# Patient Record
Sex: Female | Born: 1950 | Race: Black or African American | Hispanic: No | Marital: Married | Smoking: Never smoker
Health system: Southern US, Community
[De-identification: ages and names within clinical notes are randomized; demographics above are authoritative.]

## PROBLEM LIST (undated history)

## (undated) DIAGNOSIS — Z9889 Other specified postprocedural states: Secondary | ICD-10-CM

## (undated) DIAGNOSIS — K219 Gastro-esophageal reflux disease without esophagitis: Secondary | ICD-10-CM

## (undated) DIAGNOSIS — C801 Malignant (primary) neoplasm, unspecified: Secondary | ICD-10-CM

## (undated) DIAGNOSIS — C50919 Malignant neoplasm of unspecified site of unspecified female breast: Secondary | ICD-10-CM

## (undated) DIAGNOSIS — I509 Heart failure, unspecified: Secondary | ICD-10-CM

## (undated) DIAGNOSIS — I10 Essential (primary) hypertension: Secondary | ICD-10-CM

## (undated) DIAGNOSIS — E119 Type 2 diabetes mellitus without complications: Secondary | ICD-10-CM

## (undated) DIAGNOSIS — F32A Depression, unspecified: Secondary | ICD-10-CM

## (undated) DIAGNOSIS — E7800 Pure hypercholesterolemia, unspecified: Secondary | ICD-10-CM

## (undated) DIAGNOSIS — I639 Cerebral infarction, unspecified: Secondary | ICD-10-CM

## (undated) DIAGNOSIS — Z87442 Personal history of urinary calculi: Secondary | ICD-10-CM

## (undated) DIAGNOSIS — F419 Anxiety disorder, unspecified: Secondary | ICD-10-CM

## (undated) DIAGNOSIS — R112 Nausea with vomiting, unspecified: Secondary | ICD-10-CM

## (undated) HISTORY — PX: APPENDECTOMY: SHX54

## (undated) HISTORY — PX: MULTIPLE TOOTH EXTRACTIONS: SHX2053

## (undated) HISTORY — PX: BREAST SURGERY: SHX581

## (undated) HISTORY — DX: Malignant neoplasm of unspecified site of unspecified female breast: C50.919

## (undated) HISTORY — PX: CHOLECYSTECTOMY: SHX55

## (undated) HISTORY — PX: COLONOSCOPY: SHX174

## (undated) HISTORY — PX: EYE SURGERY: SHX253

## (undated) HISTORY — PX: STOMACH SURGERY: SHX791

## (undated) HISTORY — PX: MASTECTOMY: SHX3

## (undated) NOTE — *Deleted (*Deleted)
[facility] - Clinic Note  07/06/2020     CHIEF COMPLAINT Patient presents for No chief complaint on file.   HISTORY OF PRESENT ILLNESS: Cheryl Blankenship is a 71 y.o. female who presents to the clinic today for:   pt states no problems after last injection, she is here for IVA OS today  Referring physician: Alma Downs, PA-C  No referring provider defined for this encounter.  HISTORICAL INFORMATION:   Selected notes from the MEDICAL RECORD NUMBER Referred by Alma Downs, PA-C    CURRENT MEDICATIONS: No current outpatient medications on file. (Ophthalmic Drugs)   No current facility-administered medications for this visit. (Ophthalmic Drugs)   No current outpatient medications on file. (Other)   No current facility-administered medications for this visit. (Other)      REVIEW OF SYSTEMS:    ALLERGIES Allergies  Allergen Reactions  . Lisinopril     PAST MEDICAL HISTORY Past Medical History:  Diagnosis Date  . Anxiety   . CHF (congestive heart failure) (HCC)   . Diabetes mellitus without complication (HCC)   . Hypertension    No past surgical history on file.  FAMILY HISTORY Family History  Problem Relation Age of Onset  . Breast cancer Sister     SOCIAL HISTORY Social History   Tobacco Use  . Smoking status: Not on file  Substance Use Topics  . Alcohol use: Not on file  . Drug use: Not on file         OPHTHALMIC EXAM:  Not recorded     IMAGING AND PROCEDURES  Imaging and Procedures for 07/06/2020           ASSESSMENT/PLAN:    ICD-10-CM   1. Proliferative diabetic retinopathy of both eyes with macular edema associated with type 2 diabetes mellitus (HCC)  Z61.0960   2. Retinal edema  H35.81   3. Essential hypertension  I10   4. Hypertensive retinopathy of both eyes  H35.033   5. Pseudophakia, both eyes  Z96.1     1,2. Proliferative diabetic retinopathy w/ DME, OU (OS > OD)  - HbA1c 11.5% on  7.15.21 - exam shows scattered IRH/DBH OU; - FA (09.27.21) shows late-leaking MA, vascular nonperfusion, +NV OU -- will need PRP OU (OS first) - OCT with noncentral diabetic macular edema, both eyes  (OD> OS)   - OD: Focal edema temporal macula   - OS: Scattered cystic changes greatest superiorly - s/p IVA OD #1 (09.29.21) - s/p IVA OS #1 (10.08.2021) - recommend IVA OS #1 today, 10.08.21  - RBA of procedure discussed, questions answered - IVA informed consent obtained and signed 9.29.21 (OD) - IVA informed consent obtained and signed 10.08.21 (OS) - see procedure note - f/u October 18, 10:00am, DFE, OCT -- PRP OS  3,4. Hypertensive retinopathy OU - discussed importance of tight BP control - monitor  5. Pseudophakia OU  - s/p CE/IOL (~3-4 years ago, [place])  - IOL in good position, doing well  - monitor   Ophthalmic Meds Ordered this visit:  No orders of the defined types were placed in this encounter.      No follow-ups on file.  There are no Patient Instructions on file for this visit.  This document serves as a record of services personally performed by Karie Chimera, MD, PhD. It was created on their behalf by Herby Abraham, COA, an ophthalmic technician. The creation of this record is the provider's dictation and/or activities during the visit.  Electronically signed by: Herby Abraham, COA @TODAY @ 10:50 AM   Abbreviations: M myopia (nearsighted); A astigmatism; H hyperopia (farsighted); P presbyopia; Mrx spectacle prescription;  CTL contact lenses; OD right eye; OS left eye; OU both eyes  XT exotropia; ET esotropia; PEK punctate epithelial keratitis; PEE punctate epithelial erosions; DES dry eye syndrome; MGD meibomian gland dysfunction; ATs artificial tears; PFAT's preservative free artificial tears; NSC nuclear sclerotic cataract; PSC posterior subcapsular cataract; ERM epi-retinal membrane; PVD posterior vitreous detachment; RD retinal detachment; DM diabetes  mellitus; DR diabetic retinopathy; NPDR non-proliferative diabetic retinopathy; PDR proliferative diabetic retinopathy; CSME clinically significant macular edema; DME diabetic macular edema; dbh dot blot hemorrhages; CWS cotton wool spot; POAG primary open angle glaucoma; C/D cup-to-disc ratio; HVF humphrey visual field; GVF goldmann visual field; OCT optical coherence tomography; IOP intraocular pressure; BRVO Branch retinal vein occlusion; CRVO central retinal vein occlusion; CRAO central retinal artery occlusion; BRAO branch retinal artery occlusion; RT retinal tear; SB scleral buckle; PPV pars plana vitrectomy; VH Vitreous hemorrhage; PRP panretinal laser photocoagulation; IVK intravitreal kenalog; VMT vitreomacular traction; MH Macular hole;  NVD neovascularization of the disc; NVE neovascularization elsewhere; AREDS age related eye disease study; ARMD age related macular degeneration; POAG primary open angle glaucoma; EBMD epithelial/anterior basement membrane dystrophy; ACIOL anterior chamber intraocular lens; IOL intraocular lens; PCIOL posterior chamber intraocular lens; Phaco/IOL phacoemulsification with intraocular lens placement; PRK photorefractive keratectomy; LASIK laser assisted in situ keratomileusis; HTN hypertension; DM diabetes mellitus; COPD chronic obstructive pulmonary disease

---

## 2020-03-10 ENCOUNTER — Other Ambulatory Visit: Payer: Self-pay

## 2020-03-10 ENCOUNTER — Emergency Department (HOSPITAL_COMMUNITY)
Admission: EM | Admit: 2020-03-10 | Discharge: 2020-03-11 | Disposition: A | Discharge status: Home / Self Care | Payer: Medicare HMO | Attending: Emergency Medicine | Admitting: Emergency Medicine

## 2020-03-10 ENCOUNTER — Encounter (HOSPITAL_COMMUNITY): Payer: Self-pay

## 2020-03-10 DIAGNOSIS — Z5321 Procedure and treatment not carried out due to patient leaving prior to being seen by health care provider: Secondary | ICD-10-CM | POA: Diagnosis not present

## 2020-03-10 DIAGNOSIS — R739 Hyperglycemia, unspecified: Secondary | ICD-10-CM | POA: Diagnosis present

## 2020-03-10 DIAGNOSIS — Z760 Encounter for issue of repeat prescription: Secondary | ICD-10-CM | POA: Diagnosis not present

## 2020-03-10 HISTORY — DX: Type 2 diabetes mellitus without complications: E11.9

## 2020-03-10 HISTORY — DX: Anxiety disorder, unspecified: F41.9

## 2020-03-10 HISTORY — DX: Heart failure, unspecified: I50.9

## 2020-03-10 HISTORY — DX: Essential (primary) hypertension: I10

## 2020-03-10 NOTE — ED Triage Notes (Signed)
Patient arrived stating she recently moved and has had trouble getting all of her medications refilled. States she recently lost her son and has been feeling depressed.

## 2020-03-11 LAB — CBG MONITORING, ED: Glucose-Capillary: 347 mg/dL — ABNORMAL HIGH (ref 70–99)

## 2020-05-01 ENCOUNTER — Other Ambulatory Visit: Payer: Self-pay | Admitting: Student

## 2020-05-01 DIAGNOSIS — E2839 Other primary ovarian failure: Secondary | ICD-10-CM

## 2020-05-07 ENCOUNTER — Other Ambulatory Visit: Payer: Self-pay | Admitting: Student

## 2020-05-07 DIAGNOSIS — Z1231 Encounter for screening mammogram for malignant neoplasm of breast: Secondary | ICD-10-CM

## 2020-05-20 ENCOUNTER — Ambulatory Visit
Admission: RE | Admit: 2020-05-20 | Discharge: 2020-05-20 | Disposition: A | Discharge status: Home / Self Care | Payer: Medicare HMO | Source: Ambulatory Visit | Attending: Student | Admitting: Student

## 2020-05-20 ENCOUNTER — Other Ambulatory Visit: Payer: Self-pay

## 2020-05-20 DIAGNOSIS — Z1231 Encounter for screening mammogram for malignant neoplasm of breast: Secondary | ICD-10-CM

## 2020-05-20 IMAGING — MG DIGITAL SCREENING BILAT W/ TOMO W/ CAD
6 of 10 series · 6 of 30 positions shown · non-contrast
Comparison: None.

CLINICAL DATA: Screening.

EXAM:
DIGITAL SCREENING BILATERAL MAMMOGRAM WITH TOMO AND CAD

[R MLO synth-2D (1 of 2)]
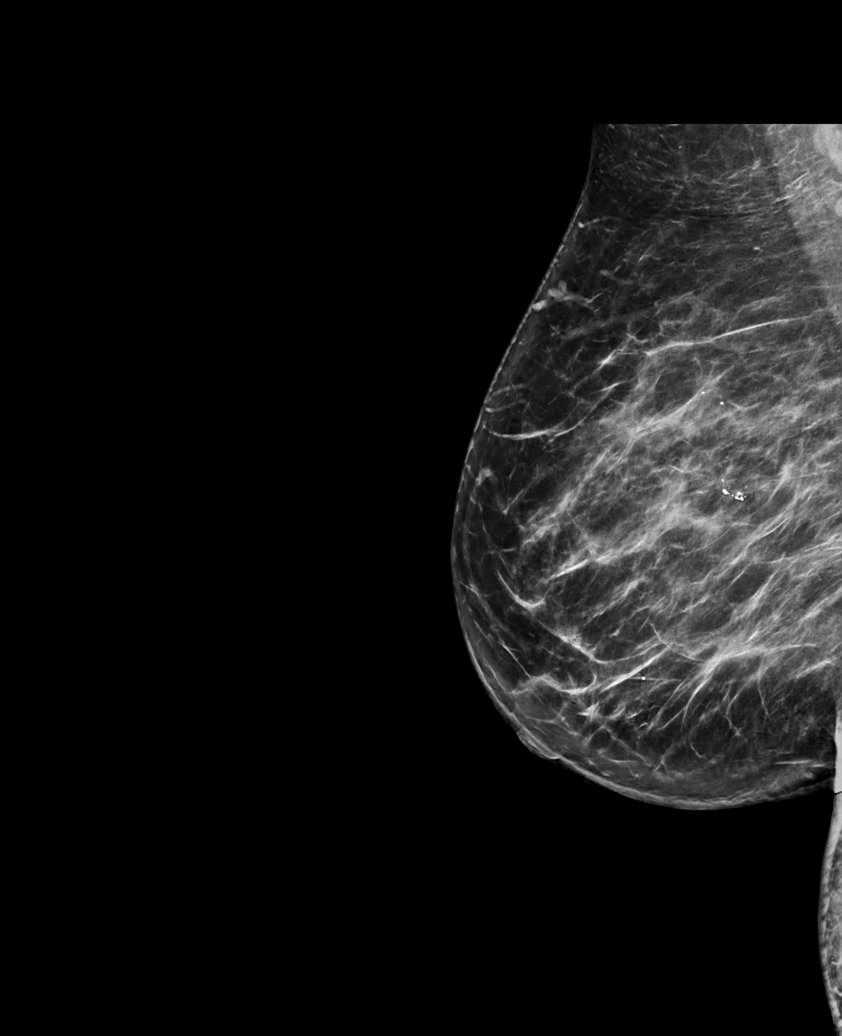

[R CC synth-2D]
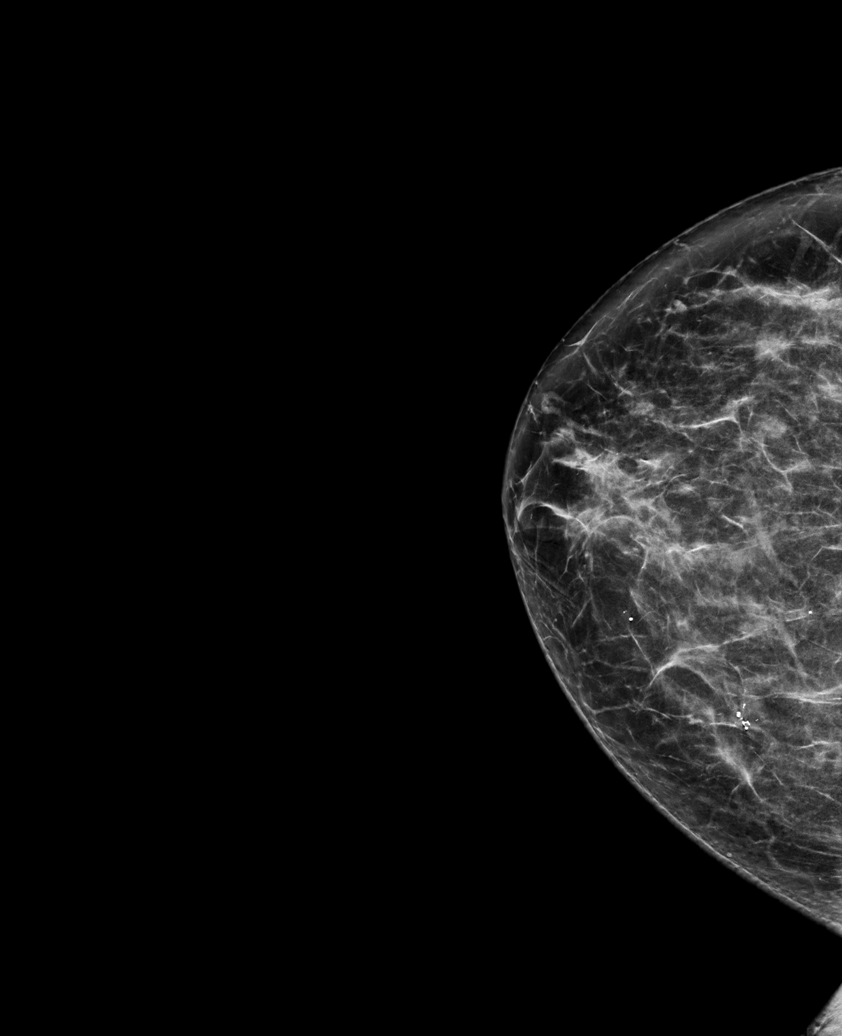

[R MLO synth-2D (2 of 2)]
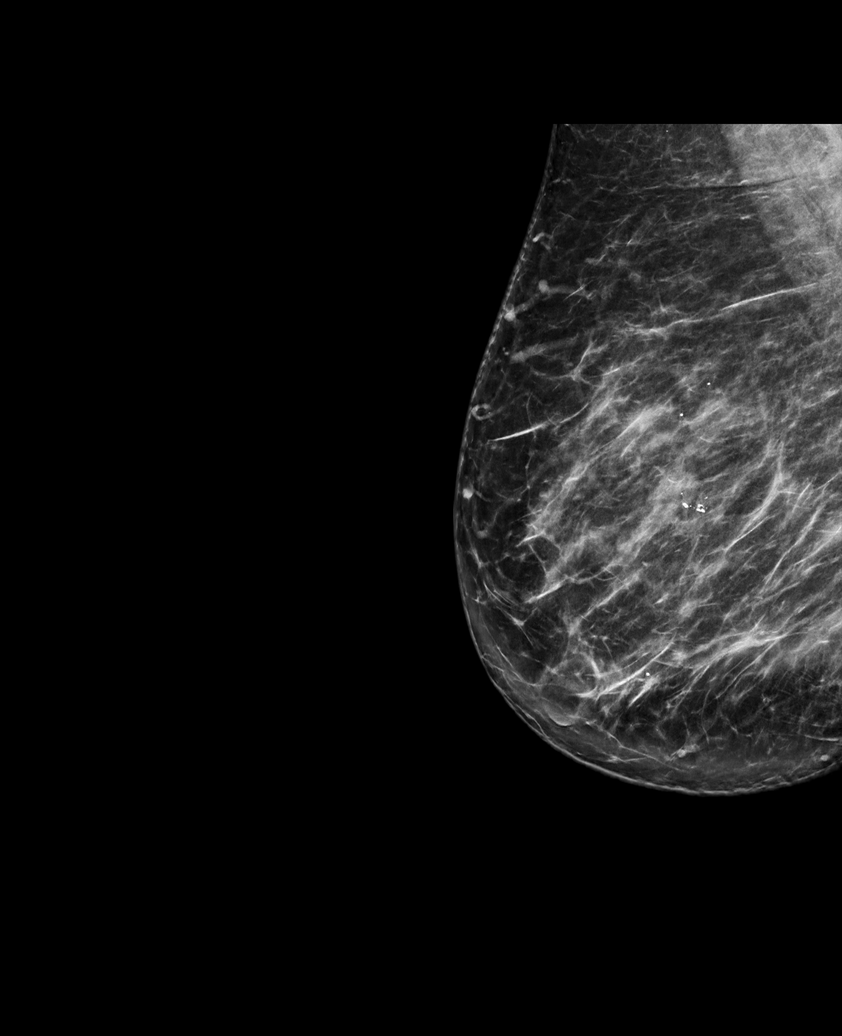

[L MLO synth-2D]
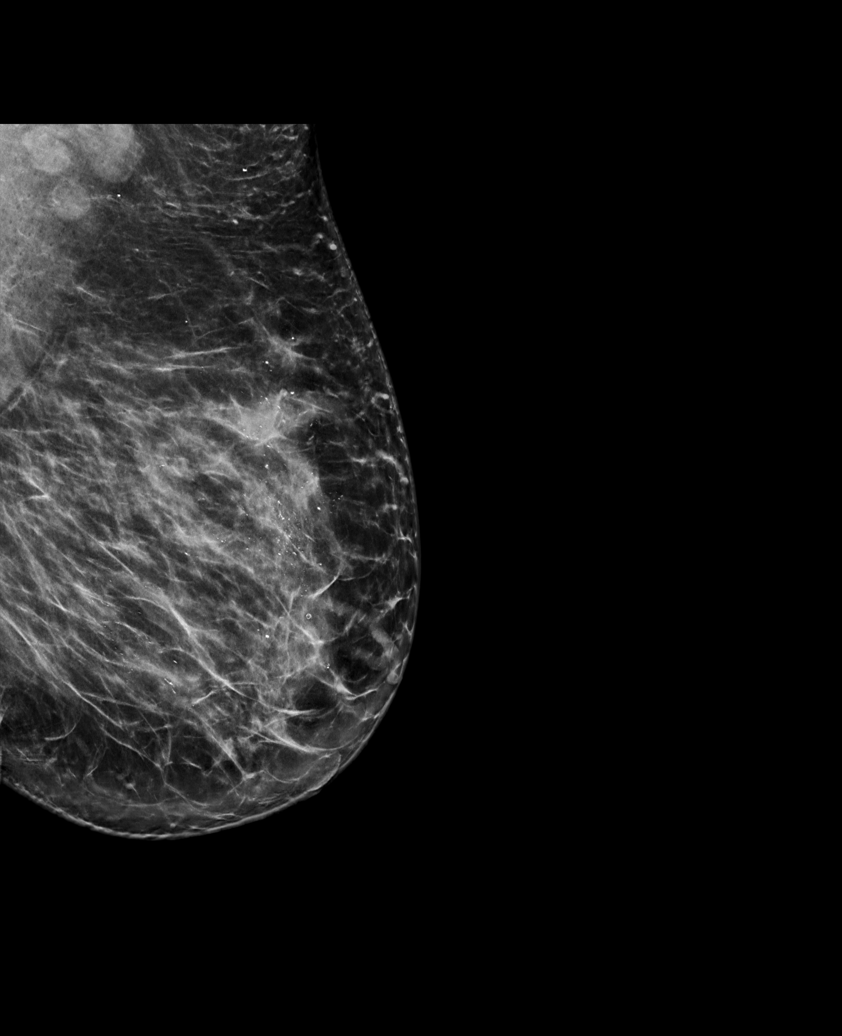

[L CC synth-2D]
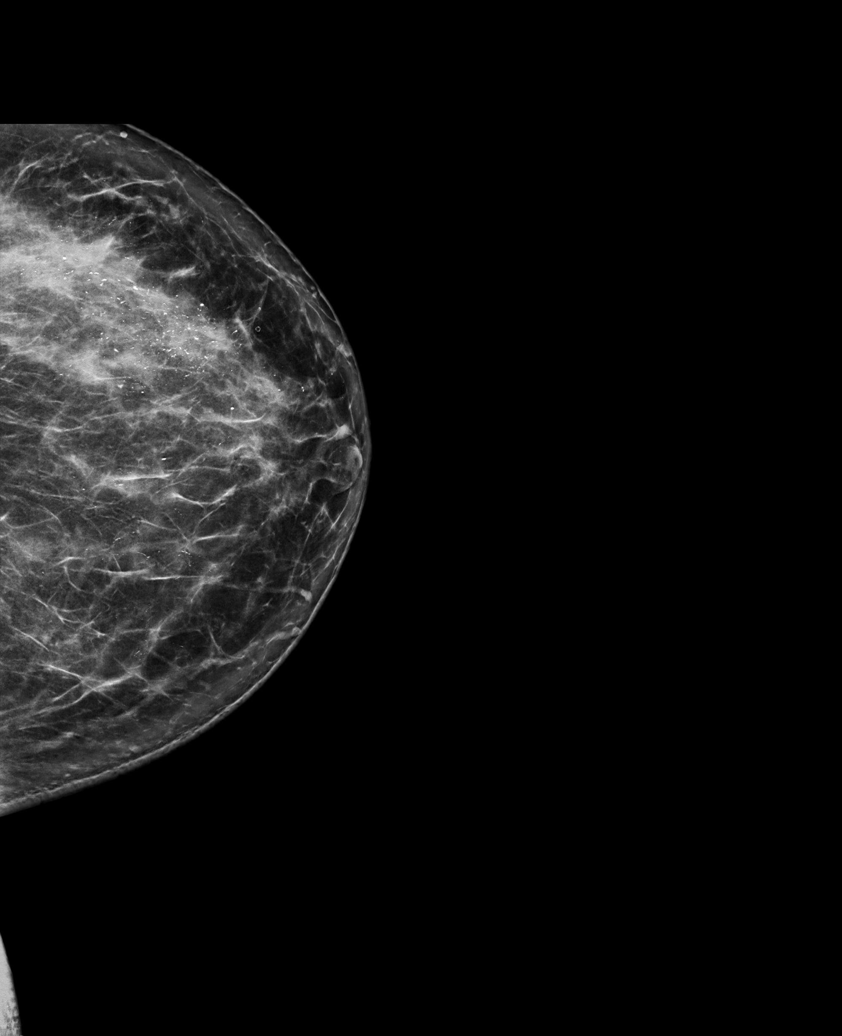

[L MLO tomo · tomo slice 49/96.0]
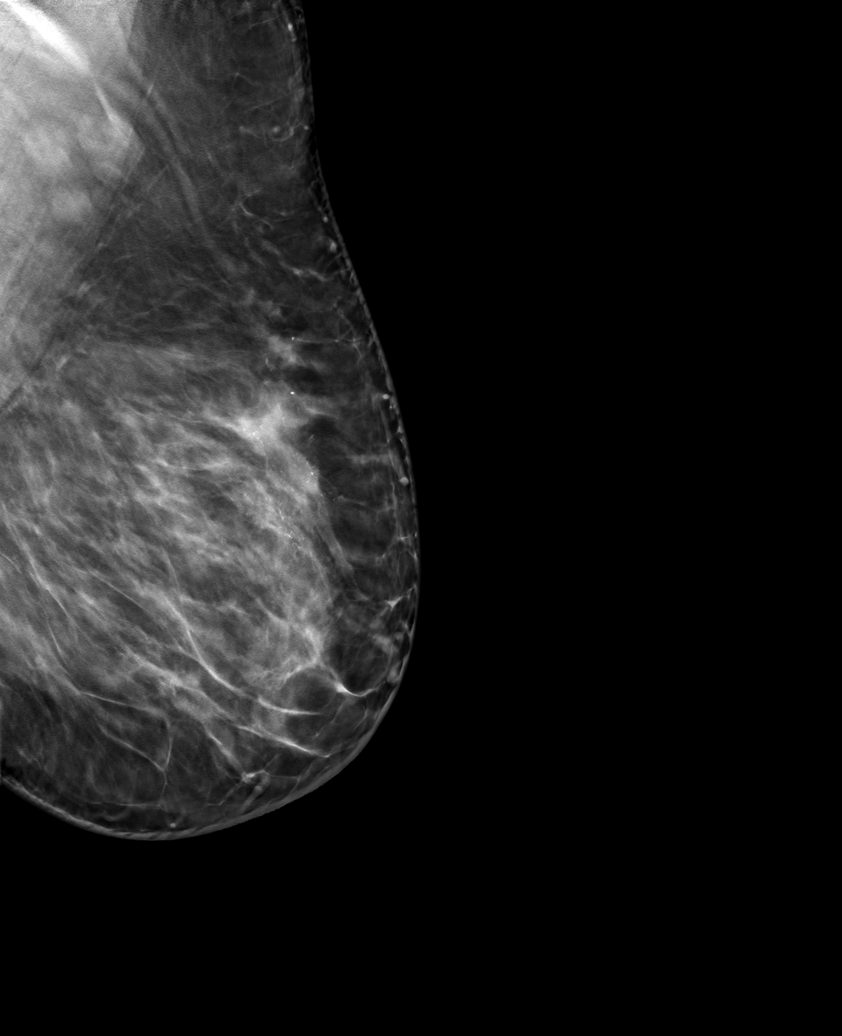

[6 of 30 positions shown; findings below may reference images not displayed]

ACR Breast Density Category c: The breast tissue is heterogeneously
dense, which may obscure small masses.
FINDINGS: In the left breast, calcifications warrant further evaluation. There
appear to be prominent lymph nodes are seen in the left axilla. In
the right breast, no findings suspicious for malignancy. Images were
processed with CAD.
IMPRESSION: Further evaluation is suggested for calcifications in the left
breast and lymph nodes in the left axilla.

RECOMMENDATION:
Diagnostic mammogram of the left breast. (Code:[WM])

The patient will be contacted regarding the findings, and additional
imaging will be scheduled.

BI-RADS CATEGORY  0: Incomplete. Need additional imaging evaluation
and/or prior mammograms for comparison.

## 2020-05-26 ENCOUNTER — Other Ambulatory Visit: Payer: Self-pay | Admitting: Student

## 2020-05-26 DIAGNOSIS — R928 Other abnormal and inconclusive findings on diagnostic imaging of breast: Secondary | ICD-10-CM

## 2020-05-27 ENCOUNTER — Encounter (INDEPENDENT_AMBULATORY_CARE_PROVIDER_SITE_OTHER): Payer: Self-pay

## 2020-05-27 ENCOUNTER — Encounter (INDEPENDENT_AMBULATORY_CARE_PROVIDER_SITE_OTHER): Payer: Medicare HMO | Admitting: Ophthalmology

## 2020-06-11 NOTE — Progress Notes (Addendum)
[facility] Note  06/15/2020     CHIEF COMPLAINT Patient presents for Retina Evaluation   HISTORY OF PRESENT ILLNESS: Cheryl Blankenship is a 69 y.o. female who presents to the clinic today for:   HPI    Retina Evaluation    In both eyes.  This started weeks ago.  Duration of weeks.  Context:  distance vision and near vision.  I, the attending physician,  performed the HPI with the patient and updated documentation appropriately.          Comments    Last BS: 260 last night Last HgA1c: 10.? Hx of CEIOL OU ([place], 3-4 years ago) Pt states her vision has become blurry in both eyes and is worse in her right eye.  Patient complains of double vision OU that is constant.  Patient denies eye pain or discomfort and denies any new or worsening floaters or fol OU.       Last edited by Bernarda Caffey, MD on 06/15/2020  8:11 AM. (History)    pt is here on the referral of Shirleen Schirmer, PA-C, she states she went to see him for a routine exam bc she was having double vision, pt states they would not give her a glasses rx until she was seen here, pt states she has never seen a retina specialist before and has never been told she has diabetic retinopathy, pt has had cataract sx, but no other eye sx  Referring physician: Shirleen Schirmer, PA-C  [address] [place],  [place] [address]  HISTORICAL INFORMATION:   Selected notes from the MEDICAL RECORD NUMBER Referred by Shirleen Schirmer, PA-C LEE:  Ocular Hx- PMH-    CURRENT MEDICATIONS: No current outpatient medications on file. (Ophthalmic Drugs)   No current facility-administered medications for this visit. (Ophthalmic Drugs)   No current outpatient medications on file. (Other)   No current facility-administered medications for this visit. (Other)      REVIEW OF SYSTEMS: ROS    Positive for: Eyes   Negative for: Constitutional, Gastrointestinal, Neurological,  Skin, Genitourinary, Musculoskeletal, HENT, Endocrine, Cardiovascular, Respiratory, Psychiatric, Allergic/Imm, Heme/Lymph   Last edited by Doneen Poisson on 06/15/2020  7:44 AM. (History)       ALLERGIES Allergies  Allergen Reactions  . Lisinopril     PAST MEDICAL HISTORY Past Medical History:  Diagnosis Date  . Anxiety   . CHF (congestive heart failure) ([place])   . Diabetes mellitus without complication ([place])   . Hypertension    History reviewed. No pertinent surgical history.  FAMILY HISTORY Family History  Problem Relation Age of Onset  . Breast cancer Sister     SOCIAL HISTORY Social History   Tobacco Use  . Smoking status: Not on file  Substance Use Topics  . Alcohol use: Not on file  . Drug use: Not on file         OPHTHALMIC EXAM:  Base Eye Exam    Visual Acuity (Snellen - Linear)      Right Left   Dist [place] 20/70 +1 20/50 -2   Dist ph [place] NI NI       Tonometry (Tonopen, 7:55 AM)      Right Left   Pressure 23 21       Pupils      Dark Light Shape React APD   Right 3 2 Round Minimal 0   Left 3 2 Round Minimal 0  Visual Fields   Poor understanding and fixation.  Unreliable.       Extraocular Movement      Right Left    Full Full       Neuro/Psych    Oriented x3: Yes   Mood/Affect: Normal       Dilation    Both eyes: 1.0% Mydriacyl, 2.5% Phenylephrine @ 7:55 AM        Slit Lamp and Fundus Exam    Slit Lamp Exam      Right Left   Lids/Lashes Dermatochalasis - upper lid Dermatochalasis - upper lid   Conjunctiva/Sclera Trace Melanosis Trace Melanosis, nasal and temporal pinguecula   Cornea arcus, 2+ Punctate epithelial erosions, well healed temporal cataract wounds arcus, 2+ Punctate epithelial erosions, well healed temporal cataract wounds   Anterior Chamber Deep and quiet Deep and quiet   Iris Round and dilated, No NVI Round and dilated, No NVI   Lens PC IOL in good position PC IOL in good position   Vitreous Vitreous  syneresis Vitreous syneresis       Fundus Exam      Right Left   Disc Pink and Sharp Pink and Sharp, mild tilt, temporal PPA   C/D Ratio 0.3 0.4   Macula Flat, Blunted foveal reflex, IRH / DBH greatest temporally, mild ERM, focal edmea Flat, Blunted foveal reflex, ERM, scattered Microaneurysms / IRH greatest temporally, scattered Cystic changes   Vessels attenuated, Tortuous, early flat NV nasally attenuated, Tortuous, early flat NV nasal to disc   Periphery Attached, 360 DBH    Attached, scatterd IRH/DBH greatest nasally        Refraction    Manifest Refraction      Sphere Cylinder Axis Dist VA   Right +0.50 +1.00 015 20/70+1   Left NI +/-3.00             IMAGING AND PROCEDURES  Imaging and Procedures for 06/15/2020  OCT, Retina - OU - Both Eyes       Right Eye Quality was good. Central Foveal Thickness: 229. Progression has no prior data. Findings include normal foveal contour, intraretinal fluid, intraretinal hyper-reflective material, no SRF (Focal edema temporal macula).   Left Eye Quality was good. Central Foveal Thickness: 201. Progression has no prior data. Findings include normal foveal contour, intraretinal fluid, no SRF (Scattered cystic changes greatest superiorly).   Notes *Images captured and stored on drive  Diagnosis / Impression:  +DME OU (noncentral, OD>OS)  Clinical management:  See below  Abbreviations: NFP - Normal foveal profile. CME - cystoid macular edema. PED - pigment epithelial detachment. IRF - intraretinal fluid. SRF - subretinal fluid. EZ - ellipsoid zone. ERM - epiretinal membrane. ORA - outer retinal atrophy. ORT - outer retinal tubulation. SRHM - subretinal hyper-reflective material. IRHM - intraretinal hyper-reflective material        Fluorescein Angiography Optos (Transit OD)       Right Eye   Progression has no prior data. Early phase findings include microaneurysm, vascular perfusion defect. Mid/Late phase findings include  retinal neovascularization, leakage, microaneurysm, vascular perfusion defect (Large patches of vascular non-perfusion nasal hemisphere with mild leaking NV).   Left Eye   Progression has no prior data. Early phase findings include microaneurysm, vascular perfusion defect, retinal neovascularization, leakage. Mid/Late phase findings include vascular perfusion defect, microaneurysm, retinal neovascularization, leakage.   Notes **Images stored on drive**  Impression: PDR OU +NVE OU (OS > OD) Vascular perfusion defects OU Leaking MA OU  ASSESSMENT/PLAN:    ICD-10-CM   1. Proliferative diabetic retinopathy of both eyes with macular edema associated with type 2 diabetes mellitus ([place])  V29.1916   2. Retinal edema  H35.81 OCT, Retina - OU - Both Eyes  3. Essential hypertension  I10   4. Hypertensive retinopathy of both eyes  H35.033 Fluorescein Angiography Optos (Transit OD)  5. Pseudophakia, both eyes  Z96.1     1,2. Proliferative diabetic retinopathy w/ DME, OU (OS > OD)  - HbA1c 11.5% on 7.15.21 - The incidence, risk factors for progression, natural history and treatment options for diabetic retinopathy were discussed with patient.   - The need for close monitoring of blood glucose, blood pressure, and serum lipids, avoiding cigarette or any type of tobacco, and the need for long term follow up was also discussed with patient. - exam shows scattered IRH/DBH OU; - FA today (09.27.21) shows late-leaking MA, vascular nonperfusion, +NV OU -- will need PRP OU (OS first) - OCT with noncentral diabetic macular edema, both eyes  (OD> OS) - discussed findings and prognosis - recommend IVA OU, OS first today, 09.27.21 - pt unable to undergo injection today, wishes to come back on Wednesday for injections - f/u 2 days, DFE, OCT, likely IVA OU  3,4. Hypertensive retinopathy OU - discussed importance of tight BP control - monitor  5. Pseudophakia OU  - s/p CE/IOL  (~3-4 years ago, [place])  - IOL in good position, doing well  - monitor   Ophthalmic Meds Ordered this visit:  No orders of the defined types were placed in this encounter.      Return in about 2 days (around 06/17/2020) for f/u [place], Sept 29, 8:00AM, PDR OU, DFE, OCT.  There are no Patient Instructions on file for this visit.   Explained the diagnoses, plan, and follow up with the patient and they expressed understanding.  Patient expressed understanding of the importance of proper follow up care.   This document serves as a record of services personally performed by Gardiner Sleeper, MD, PhD. It was created on their behalf by Leeann Must, [place], an ophthalmic technician. The creation of this record is the provider's dictation and/or activities during the visit.    Electronically signed by: Leeann Must, [place] 09.23.2021 11:54 AM   This document serves as a record of services personally performed by Gardiner Sleeper, MD, PhD. It was created on their behalf by San Jetty. [place] Shark, OA an ophthalmic technician. The creation of this record is the provider's dictation and/or activities during the visit.    Electronically signed by: San Jetty. Owens Shark, [place] 09.27.2021 11:54 AM  Gardiner Sleeper, M.D., Ph.D. Diseases & Surgery of the Retina and Vitreous [facility]  I have reviewed the above documentation for accuracy and completeness, and I agree with the above. Gardiner Sleeper, M.D., Ph.D. 06/15/20 11:54 AM   Abbreviations: M myopia (nearsighted); A astigmatism; H hyperopia (farsighted); P presbyopia; Mrx spectacle prescription;  CTL contact lenses; OD right eye; OS left eye; OU both eyes  XT exotropia; ET esotropia; PEK punctate epithelial keratitis; PEE punctate epithelial erosions; DES dry eye syndrome; MGD meibomian gland dysfunction; ATs artificial tears; PFAT's preservative free artificial tears; [place] nuclear sclerotic cataract; PSC posterior subcapsular cataract; ERM  epi-retinal membrane; PVD posterior vitreous detachment; RD retinal detachment; DM diabetes mellitus; DR diabetic retinopathy; NPDR non-proliferative diabetic retinopathy; PDR proliferative diabetic retinopathy; CSME clinically significant macular edema; DME diabetic macular edema; dbh dot blot hemorrhages; CWS  cotton wool spot; POAG primary open angle glaucoma; C/D cup-to-disc ratio; HVF humphrey visual field; GVF goldmann visual field; OCT optical coherence tomography; IOP intraocular pressure; BRVO Branch retinal vein occlusion; CRVO central retinal vein occlusion; CRAO central retinal artery occlusion; BRAO branch retinal artery occlusion; RT retinal tear; SB scleral buckle; PPV pars plana vitrectomy; VH Vitreous hemorrhage; PRP panretinal laser photocoagulation; IVK intravitreal kenalog; VMT vitreomacular traction; MH Macular hole;  NVD neovascularization of the disc; NVE neovascularization elsewhere; AREDS age related eye disease study; ARMD age related macular degeneration; POAG primary open angle glaucoma; EBMD epithelial/anterior basement membrane dystrophy; ACIOL anterior chamber intraocular lens; IOL intraocular lens; PCIOL posterior chamber intraocular lens; Phaco/IOL phacoemulsification with intraocular lens placement; [place] photorefractive keratectomy; LASIK laser assisted in situ keratomileusis; HTN hypertension; DM diabetes mellitus; COPD chronic obstructive pulmonary disease

## 2020-06-15 ENCOUNTER — Ambulatory Visit (INDEPENDENT_AMBULATORY_CARE_PROVIDER_SITE_OTHER): Payer: Medicare HMO | Admitting: Ophthalmology

## 2020-06-15 ENCOUNTER — Encounter (INDEPENDENT_AMBULATORY_CARE_PROVIDER_SITE_OTHER): Payer: Self-pay | Admitting: Ophthalmology

## 2020-06-15 ENCOUNTER — Other Ambulatory Visit: Payer: Self-pay

## 2020-06-15 DIAGNOSIS — I10 Essential (primary) hypertension: Secondary | ICD-10-CM

## 2020-06-15 DIAGNOSIS — H3581 Retinal edema: Secondary | ICD-10-CM

## 2020-06-15 DIAGNOSIS — E113513 Type 2 diabetes mellitus with proliferative diabetic retinopathy with macular edema, bilateral: Secondary | ICD-10-CM

## 2020-06-15 DIAGNOSIS — H35033 Hypertensive retinopathy, bilateral: Secondary | ICD-10-CM

## 2020-06-15 DIAGNOSIS — Z961 Presence of intraocular lens: Secondary | ICD-10-CM

## 2020-06-16 NOTE — Progress Notes (Signed)
[facility] Note  06/17/2020     CHIEF COMPLAINT Patient presents for Retina Follow Up   HISTORY OF PRESENT ILLNESS: Cheryl Blankenship is a 69 y.o. female who presents to the clinic today for:   HPI    Retina Follow Up    Patient presents with  Diabetic Retinopathy.  In both eyes.  Duration of 2 days.  I, the attending physician,  performed the HPI with the patient and updated documentation appropriately.          Comments    2 day follow up PDR OU- Patient is nervous today.  Vision stable since last visit.   Dr. Katy Fitch put her on 2 eye drops: 1 she uses QID OU x2 weeks, BID x2 weeks... the other BID OU BS: 290 then she rechecked and 260 this morning A1C: 10's       Last edited by Bernarda Caffey, MD on 06/18/2020 12:56 AM. (History)      Referring physician: Shirleen Schirmer, PA-C  No referring provider defined for this encounter.  HISTORICAL INFORMATION:   Selected notes from the MEDICAL RECORD NUMBER Referred by Shirleen Schirmer, PA-C    CURRENT MEDICATIONS: No current outpatient medications on file. (Ophthalmic Drugs)   No current facility-administered medications for this visit. (Ophthalmic Drugs)   No current outpatient medications on file. (Other)   No current facility-administered medications for this visit. (Other)      REVIEW OF SYSTEMS: ROS    Positive for: Gastrointestinal, Endocrine, Cardiovascular, Eyes, Psychiatric   Negative for: Constitutional, Neurological, Skin, Genitourinary, Musculoskeletal, HENT, Respiratory, Allergic/Imm, Heme/Lymph   Last edited by Leonie Douglas, COA on 06/17/2020  9:21 AM. (History)       ALLERGIES Allergies  Allergen Reactions  . Lisinopril     PAST MEDICAL HISTORY Past Medical History:  Diagnosis Date  . Anxiety   . CHF (congestive heart failure) ([place])   . Diabetes mellitus without complication ([place])   . Hypertension    History reviewed. No pertinent surgical  history.  FAMILY HISTORY Family History  Problem Relation Age of Onset  . Breast cancer Sister     SOCIAL HISTORY Social History   Tobacco Use  . Smoking status: Not on file  Substance Use Topics  . Alcohol use: Not on file  . Drug use: Not on file         OPHTHALMIC EXAM:  Base Eye Exam    Visual Acuity (Snellen - Linear)      Right Left   Dist [place] 20/70 20/50   Dist ph [place] NI NI       Tonometry (Tonopen, 9:34 AM)      Right Left   Pressure 21 19       Pupils      Dark Light Shape React APD   Right 3 2 Round Brisk None   Left 3 2 Round Brisk None       Visual Fields (Counting fingers)      Left Right    Full Full       Extraocular Movement      Right Left    Full Full       Neuro/Psych    Oriented x3: Yes   Mood/Affect: Normal       Dilation    Both eyes: 1.0% Mydriacyl, 2.5% Phenylephrine @ 9:35 AM        Slit Lamp and Fundus Exam    Slit Lamp Exam  Right Left   Lids/Lashes Dermatochalasis - upper lid Dermatochalasis - upper lid   Conjunctiva/Sclera Trace Melanosis Trace Melanosis, nasal and temporal pinguecula   Cornea arcus, 2+ Punctate epithelial erosions, well healed temporal cataract wounds arcus, 2+ Punctate epithelial erosions, well healed temporal cataract wounds   Anterior Chamber Deep and quiet Deep and quiet   Iris Round and dilated, No NVI Round and dilated, No NVI   Lens PC IOL in good position PC IOL in good position   Vitreous Vitreous syneresis Vitreous syneresis       Fundus Exam      Right Left   Disc Pink and Sharp Pink and Sharp, mild tilt, temporal PPA   C/D Ratio 0.3 0.4   Macula Flat, Blunted foveal reflex, IRH / DBH greatest temporally, mild ERM, focal edmea Flat, Blunted foveal reflex, ERM, scattered Microaneurysms / IRH greatest temporally, scattered Cystic changes   Vessels attenuated, Tortuous, early flat NV nasally attenuated, Tortuous, early flat NV nasal to disc   Periphery Attached, 360 DBH    Attached,  scatterd IRH/DBH greatest nasally          IMAGING AND PROCEDURES  Imaging and Procedures for 06/17/2020  OCT, Retina - OU - Both Eyes       Right Eye Quality was good. Central Foveal Thickness: 229. Progression has been stable. Findings include normal foveal contour, intraretinal fluid, intraretinal hyper-reflective material, no SRF (Focal edema temporal macula).   Left Eye Quality was good. Central Foveal Thickness: 207. Progression has been stable. Findings include normal foveal contour, intraretinal fluid, no SRF (Scattered cystic changes greatest superiorly).   Notes *Images captured and stored on drive  Diagnosis / Impression:  +DME OU (noncentral, OD>OS) OD: Focal edema temporal macula OS: Scattered cystic changes greatest superiorly  Clinical management:  See below  Abbreviations: NFP - Normal foveal profile. CME - cystoid macular edema. PED - pigment epithelial detachment. IRF - intraretinal fluid. SRF - subretinal fluid. EZ - ellipsoid zone. ERM - epiretinal membrane. ORA - outer retinal atrophy. ORT - outer retinal tubulation. SRHM - subretinal hyper-reflective material. IRHM - intraretinal hyper-reflective material        Intravitreal Injection, Pharmacologic Agent - OD - Right Eye       Time Out 06/17/2020. 11:04 AM. Confirmed correct patient, procedure, site, and patient consented.   Anesthesia Topical anesthesia was used. Anesthetic medications included Lidocaine 2%, Proparacaine 0.5%.   Procedure Preparation included 5% betadine to ocular surface, eyelid speculum. A (32g) needle was used.   Injection:  1.25 mg Bevacizumab (AVASTIN) SOLN   NDC: 70360-001-02, Lot: [id number], Expiration date: 07/26/2020   Route: Intravitreal, Site: Right Eye, Waste: 0.05 mL  Post-op Post injection exam found visual acuity of at least counting fingers. The patient tolerated the procedure well. There were no complications. The patient received written and verbal post  procedure care education. Post injection medications were not given.                 ASSESSMENT/PLAN:    ICD-10-CM   1. Proliferative diabetic retinopathy of both eyes with macular edema associated with type 2 diabetes mellitus (HCC)  O35.0093 Intravitreal Injection, Pharmacologic Agent - OD - Right Eye    Bevacizumab (AVASTIN) SOLN 1.25 mg  2. Retinal edema  H35.81 OCT, Retina - OU - Both Eyes  3. Essential hypertension  I10   4. Hypertensive retinopathy of both eyes  H35.033   5. Pseudophakia, both eyes  Z96.1     1,2. Proliferative  diabetic retinopathy w/ DME, OU (OS > OD)  - HbA1c 11.5% on 7.15.21 - exam shows scattered IRH/DBH OU; - FA (09.27.21) shows late-leaking MA, vascular nonperfusion, +NV OU -- will need PRP OU (OS first) - OCT with noncentral diabetic macular edema, both eyes  (OD> OS)   - OD: Focal edema temporal macula   - OS: Scattered cystic changes greatest superiorly - discussed findings and prognosis - recommend IVA OU, OD first today, 09.29.21  - RBA of procedure discussed, questions answered  - IVA informed consent obtained and signed 9.29.21 - see procedure note - f/u 1 wk, DFE, OCT, IVA OS  3,4. Hypertensive retinopathy OU - discussed importance of tight BP control - monitor  5. Pseudophakia OU  - s/p CE/IOL (~3-4 years ago, [place])  - IOL in good position, doing well  - monitor   Ophthalmic Meds Ordered this visit:  Meds ordered this encounter  Medications  . Bevacizumab (AVASTIN) SOLN 1.25 mg       Return in about 1 week (around 06/24/2020) for 1 wk f/u for PDR OU w/DFE/OCT/IVA OS.  There are no Patient Instructions on file for this visit.   Explained the diagnoses, plan, and follow up with the patient and they expressed understanding.  Patient expressed understanding of the importance of proper follow up care.   This document serves as a record of services personally performed by Gardiner Sleeper, MD, PhD. It was created on their  behalf by San Jetty. Owens Shark, OA an ophthalmic technician. The creation of this record is the provider's dictation and/or activities during the visit.    Electronically signed by: San Jetty. Owens Shark, [place] 09.28.2021 12:59 AM   This document serves as a record of services personally performed by Gardiner Sleeper, MD, PhD. It was created on their behalf by Estill Bakes, COT an ophthalmic technician. The creation of this record is the provider's dictation and/or activities during the visit.    Electronically signed by: Estill Bakes, COT 9.29.21 @ 10:57 a.m.  Gardiner Sleeper, M.D., Ph.D. Diseases & Surgery of the Retina and [place] 9.29.21  I have reviewed the above documentation for accuracy and completeness, and I agree with the above. Gardiner Sleeper, M.D., Ph.D. 06/18/20 12:59 AM   Abbreviations: M myopia (nearsighted); A astigmatism; H hyperopia (farsighted); P presbyopia; Mrx spectacle prescription;  CTL contact lenses; OD right eye; OS left eye; OU both eyes  XT exotropia; ET esotropia; PEK punctate epithelial keratitis; PEE punctate epithelial erosions; DES dry eye syndrome; MGD meibomian gland dysfunction; ATs artificial tears; PFAT's preservative free artificial tears; [place] nuclear sclerotic cataract; PSC posterior subcapsular cataract; ERM epi-retinal membrane; PVD posterior vitreous detachment; RD retinal detachment; DM diabetes mellitus; DR diabetic retinopathy; NPDR non-proliferative diabetic retinopathy; PDR proliferative diabetic retinopathy; CSME clinically significant macular edema; DME diabetic macular edema; dbh dot blot hemorrhages; CWS cotton wool spot; POAG primary open angle glaucoma; C/D cup-to-disc ratio; HVF humphrey visual field; GVF goldmann visual field; OCT optical coherence tomography; IOP intraocular pressure; BRVO Branch retinal vein occlusion; CRVO central retinal vein occlusion; CRAO central retinal artery occlusion; BRAO branch retinal  artery occlusion; RT retinal tear; SB scleral buckle; PPV pars plana vitrectomy; VH Vitreous hemorrhage; PRP panretinal laser photocoagulation; IVK intravitreal kenalog; VMT vitreomacular traction; MH Macular hole;  NVD neovascularization of the disc; NVE neovascularization elsewhere; AREDS age related eye disease study; ARMD age related macular degeneration; POAG primary open angle glaucoma; EBMD epithelial/anterior basement membrane dystrophy; ACIOL anterior  chamber intraocular lens; IOL intraocular lens; PCIOL posterior chamber intraocular lens; Phaco/IOL phacoemulsification with intraocular lens placement; [place] photorefractive keratectomy; LASIK laser assisted in situ keratomileusis; HTN hypertension; DM diabetes mellitus; COPD chronic obstructive pulmonary disease

## 2020-06-17 ENCOUNTER — Ambulatory Visit (INDEPENDENT_AMBULATORY_CARE_PROVIDER_SITE_OTHER): Payer: Medicare HMO | Admitting: Ophthalmology

## 2020-06-17 ENCOUNTER — Other Ambulatory Visit: Payer: Self-pay

## 2020-06-17 DIAGNOSIS — H3581 Retinal edema: Secondary | ICD-10-CM

## 2020-06-17 DIAGNOSIS — I10 Essential (primary) hypertension: Secondary | ICD-10-CM

## 2020-06-17 DIAGNOSIS — E113513 Type 2 diabetes mellitus with proliferative diabetic retinopathy with macular edema, bilateral: Secondary | ICD-10-CM

## 2020-06-17 DIAGNOSIS — H35033 Hypertensive retinopathy, bilateral: Secondary | ICD-10-CM

## 2020-06-17 DIAGNOSIS — Z961 Presence of intraocular lens: Secondary | ICD-10-CM

## 2020-06-18 ENCOUNTER — Encounter (INDEPENDENT_AMBULATORY_CARE_PROVIDER_SITE_OTHER): Payer: Self-pay | Admitting: Ophthalmology

## 2020-06-18 DIAGNOSIS — E113513 Type 2 diabetes mellitus with proliferative diabetic retinopathy with macular edema, bilateral: Secondary | ICD-10-CM

## 2020-06-18 MED ORDER — BEVACIZUMAB CHEMO INJECTION 1.25MG/0.05ML SYRINGE FOR KALEIDOSCOPE
1.2500 mg | INTRAVITREAL | Status: AC | PRN
Start: 2020-06-18 — End: 2020-06-18
  Administered 2020-06-18: 1.25 mg via INTRAVITREAL

## 2020-06-23 NOTE — Progress Notes (Signed)
[facility] Note  06/26/2020     CHIEF COMPLAINT Patient presents for Retina Follow Up   HISTORY OF PRESENT ILLNESS: Cheryl Blankenship is a 69 y.o. female who presents to the clinic today for:   HPI    Retina Follow Up    Patient presents with  Diabetic Retinopathy.  In both eyes.  This started 1 week ago.  I, the attending physician,  performed the HPI with the patient and updated documentation appropriately.          Comments    Patient here for 1 week retina follow up for PDR OU. Patient states vision terrible. Still as the blood vision. Hard to see. Has eye pain.        Last edited by Bernarda Caffey, MD on 06/26/2020  1:43 PM. (History)    pt states no problems after last injection, she is here for IVA OS today  Referring physician: Shirleen Schirmer, PA-C  No referring provider defined for this encounter.  HISTORICAL INFORMATION:   Selected notes from the MEDICAL RECORD NUMBER Referred by Shirleen Schirmer, PA-C    CURRENT MEDICATIONS: No current outpatient medications on file. (Ophthalmic Drugs)   No current facility-administered medications for this visit. (Ophthalmic Drugs)   No current outpatient medications on file. (Other)   No current facility-administered medications for this visit. (Other)      REVIEW OF SYSTEMS: ROS    Positive for: Gastrointestinal, Endocrine, Cardiovascular, Eyes, Psychiatric   Negative for: Constitutional, Neurological, Skin, Genitourinary, Musculoskeletal, HENT, Respiratory, Allergic/Imm, Heme/Lymph   Last edited by Theodore Demark, COA on 06/26/2020  1:20 PM. (History)       ALLERGIES Allergies  Allergen Reactions  . Lisinopril     PAST MEDICAL HISTORY Past Medical History:  Diagnosis Date  . Anxiety   . CHF (congestive heart failure) ([place])   . Diabetes mellitus without complication ([place])   . Hypertension    History reviewed. No pertinent surgical history.  FAMILY HISTORY Family  History  Problem Relation Age of Onset  . Breast cancer Sister     SOCIAL HISTORY Social History   Tobacco Use  . Smoking status: Not on file  Substance Use Topics  . Alcohol use: Not on file  . Drug use: Not on file         OPHTHALMIC EXAM:  Base Eye Exam    Visual Acuity (Snellen - Linear)      Right Left   Dist [place] 20/80 20/70   Dist ph [place] 20/70 NI       Tonometry (Tonopen, 1:16 PM)      Right Left   Pressure 20 22       Pupils      Dark Light Shape React APD   Right 3 2 Round Brisk None   Left 3 2 Round Brisk None       Visual Fields (Counting fingers)      Left Right    Full Full       Extraocular Movement      Right Left    Full Full       Neuro/Psych    Oriented x3: Yes   Mood/Affect: Normal       Dilation    Both eyes: 1.0% Mydriacyl, 2.5% Phenylephrine @ 1:16 PM        Slit Lamp and Fundus Exam    Slit Lamp Exam      Right Left   Lids/Lashes Dermatochalasis -  upper lid Dermatochalasis - upper lid   Conjunctiva/Sclera Trace Melanosis Trace Melanosis, nasal and temporal pinguecula   Cornea arcus, 2+ Punctate epithelial erosions, well healed temporal cataract wounds arcus, 2+ Punctate epithelial erosions, well healed temporal cataract wounds   Anterior Chamber Deep and quiet Deep and quiet   Iris Round and dilated, No NVI Round and dilated, No NVI   Lens PC IOL in good position PC IOL in good position   Vitreous Vitreous syneresis Vitreous syneresis       Fundus Exam      Right Left   Disc Pink and Sharp Pink and Sharp, mild tilt, temporal PPA   C/D Ratio 0.3 0.4   Macula Flat, Blunted foveal reflex, IRH / DBH greatest temporally, mild ERM, focal edmea Flat, Blunted foveal reflex, ERM, scattered Microaneurysms / IRH greatest temporally, scattered Cystic changes   Vessels attenuated, Tortuous, early flat NV nasally attenuated, Tortuous, early flat NV nasal to disc   Periphery Attached, 360 DBH    Attached, scatterd IRH/DBH greatest  nasally          IMAGING AND PROCEDURES  Imaging and Procedures for 06/26/2020  OCT, Retina - OU - Both Eyes       Right Eye Quality was good. Central Foveal Thickness: 229. Progression has improved. Findings include normal foveal contour, intraretinal fluid, intraretinal hyper-reflective material, no SRF (Mild interval improvement in Focal edema temporal macula).   Left Eye Quality was good. Central Foveal Thickness: 209. Progression has been stable. Findings include normal foveal contour, intraretinal fluid, no SRF (Scattered cystic changes greatest superiorly).   Notes *Images captured and stored on drive  Diagnosis / Impression:  +DME OU (noncentral, OD>OS) OD: Mild interval improvement in Focal edema temporal macula OS: Scattered cystic changes greatest superiorly  Clinical management:  See below  Abbreviations: NFP - Normal foveal profile. CME - cystoid macular edema. PED - pigment epithelial detachment. IRF - intraretinal fluid. SRF - subretinal fluid. EZ - ellipsoid zone. ERM - epiretinal membrane. ORA - outer retinal atrophy. ORT - outer retinal tubulation. SRHM - subretinal hyper-reflective material. IRHM - intraretinal hyper-reflective material        Intravitreal Injection, Pharmacologic Agent - OS - Left Eye       Time Out 06/26/2020. 1:34 PM. Confirmed correct patient, procedure, site, and patient consented.   Anesthesia Topical anesthesia was used. Anesthetic medications included Lidocaine 2%, Proparacaine 0.5%.   Procedure A supplied needle was used.   Injection:  1.25 mg Bevacizumab (AVASTIN) SOLN   NDC: [address]-7, Lot: [id number] , Expiration date: 08/06/2020   Route: Intravitreal, Site: Left Eye, Waste: 0 mL  Post-op Post injection exam found visual acuity of at least counting fingers. The patient tolerated the procedure well. There were no complications. The patient received written and verbal post procedure care education. Post injection  medications were not given.                 ASSESSMENT/PLAN:    ICD-10-CM   1. Proliferative diabetic retinopathy of both eyes with macular edema associated with type 2 diabetes mellitus (HCC)  08/19/2020 Intravitreal Injection, Pharmacologic Agent - OS - Left Eye    Bevacizumab (AVASTIN) SOLN 1.25 mg  2. Retinal edema  H35.81 OCT, Retina - OU - Both Eyes  3. Essential hypertension  I10   4. Hypertensive retinopathy of both eyes  H35.033   5. Pseudophakia, both eyes  Z96.1     1,2. Proliferative diabetic retinopathy w/ DME, OU (OS > OD)  -  HbA1c 11.5% on 7.15.21 - exam shows scattered IRH/DBH OU; - FA (09.27.21) shows late-leaking MA, vascular nonperfusion, +NV OU -- will need PRP OU (OS first) - OCT with noncentral diabetic macular edema, both eyes  (OD> OS)   - OD: Focal edema temporal macula   - OS: Scattered cystic changes greatest superiorly - s/p IVA OD #1 (09.29.21) - recommend IVA OS #1 today, 10.08.21  - RBA of procedure discussed, questions answered - IVA informed consent obtained and signed 9.29.21 (OD) - IVA informed consent obtained and signed 10.08.21 (OS) - see procedure note - f/u October 18, 10:00am, DFE, OCT -- PRP OS  3,4. Hypertensive retinopathy OU - discussed importance of tight BP control - monitor  5. Pseudophakia OU  - s/p CE/IOL (~3-4 years ago, [place])  - IOL in good position, doing well  - monitor   Ophthalmic Meds Ordered this visit:  Meds ordered this encounter  Medications  . Bevacizumab (AVASTIN) SOLN 1.25 mg       Return in about 10 days (around 07/06/2020) for f/u PDR OU, DFE, OCT, Laser.  There are no Patient Instructions on file for this visit.   Explained the diagnoses, plan, and follow up with the patient and they expressed understanding.  Patient expressed understanding of the importance of proper follow up care.   This document serves as a record of services personally performed by Gardiner Sleeper, MD, PhD. It was  created on their behalf by San Jetty. Owens Shark, OA an ophthalmic technician. The creation of this record is the provider's dictation and/or activities during the visit.    Electronically signed by: San Jetty. [place], [place] 10.05.2021 1:45 PM   Gardiner Sleeper, M.D., Ph.D. Diseases & Surgery of the Retina and Vitreous [facility] [place]  I have reviewed the above documentation for accuracy and completeness, and I agree with the above. Gardiner Sleeper, M.D., Ph.D. 06/26/20 1:45 PM   Abbreviations: M myopia (nearsighted); A astigmatism; H hyperopia (farsighted); P presbyopia; Mrx spectacle prescription;  CTL contact lenses; OD right eye; OS left eye; OU both eyes  XT exotropia; ET esotropia; PEK punctate epithelial keratitis; PEE punctate epithelial erosions; DES dry eye syndrome; MGD meibomian gland dysfunction; ATs artificial tears; PFAT's preservative free artificial tears; [place] nuclear sclerotic cataract; PSC posterior subcapsular cataract; ERM epi-retinal membrane; PVD posterior vitreous detachment; RD retinal detachment; DM diabetes mellitus; DR diabetic retinopathy; NPDR non-proliferative diabetic retinopathy; PDR proliferative diabetic retinopathy; CSME clinically significant macular edema; DME diabetic macular edema; dbh dot blot hemorrhages; CWS cotton wool spot; POAG primary open angle glaucoma; C/D cup-to-disc ratio; HVF humphrey visual field; GVF goldmann visual field; OCT optical coherence tomography; IOP intraocular pressure; BRVO Branch retinal vein occlusion; CRVO central retinal vein occlusion; CRAO central retinal artery occlusion; BRAO branch retinal artery occlusion; RT retinal tear; SB scleral buckle; PPV pars plana vitrectomy; VH Vitreous hemorrhage; PRP panretinal laser photocoagulation; IVK intravitreal kenalog; VMT vitreomacular traction; MH Macular hole;  NVD neovascularization of the disc; NVE neovascularization elsewhere; AREDS age related eye disease study; ARMD age  related macular degeneration; POAG primary open angle glaucoma; EBMD epithelial/anterior basement membrane dystrophy; ACIOL anterior chamber intraocular lens; IOL intraocular lens; PCIOL posterior chamber intraocular lens; Phaco/IOL phacoemulsification with intraocular lens placement; [place] photorefractive keratectomy; LASIK laser assisted in situ keratomileusis; HTN hypertension; DM diabetes mellitus; COPD chronic obstructive pulmonary disease

## 2020-06-26 ENCOUNTER — Other Ambulatory Visit: Payer: Self-pay

## 2020-06-26 ENCOUNTER — Encounter (INDEPENDENT_AMBULATORY_CARE_PROVIDER_SITE_OTHER): Payer: Self-pay | Admitting: Ophthalmology

## 2020-06-26 ENCOUNTER — Ambulatory Visit (INDEPENDENT_AMBULATORY_CARE_PROVIDER_SITE_OTHER): Payer: Medicare HMO | Admitting: Ophthalmology

## 2020-06-26 DIAGNOSIS — I10 Essential (primary) hypertension: Secondary | ICD-10-CM

## 2020-06-26 DIAGNOSIS — H35033 Hypertensive retinopathy, bilateral: Secondary | ICD-10-CM

## 2020-06-26 DIAGNOSIS — H3581 Retinal edema: Secondary | ICD-10-CM | POA: Diagnosis not present

## 2020-06-26 DIAGNOSIS — E113513 Type 2 diabetes mellitus with proliferative diabetic retinopathy with macular edema, bilateral: Secondary | ICD-10-CM | POA: Diagnosis not present

## 2020-06-26 DIAGNOSIS — Z961 Presence of intraocular lens: Secondary | ICD-10-CM

## 2020-06-26 MED ORDER — BEVACIZUMAB CHEMO INJECTION 1.25MG/0.05ML SYRINGE FOR KALEIDOSCOPE
1.2500 mg | INTRAVITREAL | Status: AC | PRN
  Administered 2020-06-26: 1.25 mg via INTRAVITREAL

## 2020-07-01 ENCOUNTER — Other Ambulatory Visit: Payer: Self-pay | Admitting: Student

## 2020-07-01 ENCOUNTER — Ambulatory Visit
Admission: RE | Admit: 2020-07-01 | Discharge: 2020-07-01 | Disposition: A | Discharge status: Home / Self Care | Payer: Medicare HMO | Source: Ambulatory Visit | Attending: Student | Admitting: Student

## 2020-07-01 ENCOUNTER — Other Ambulatory Visit: Payer: Self-pay

## 2020-07-01 DIAGNOSIS — R928 Other abnormal and inconclusive findings on diagnostic imaging of breast: Secondary | ICD-10-CM

## 2020-07-01 IMAGING — US US BREAST*L* LIMITED INC AXILLA
1 series · 13 of 25 positions shown · non-contrast
Comparison: Previous exam(s).

CLINICAL DATA: Screening recall for left breast calcifications.

EXAM:
DIGITAL DIAGNOSTIC LEFT MAMMOGRAM WITH CAD
LEFT BREAST ULTRASOUND

[Series 1: us breast*left* limited inc axilla · 0.08mm/px · 13 of 35 slices shown]
[im 1/35]
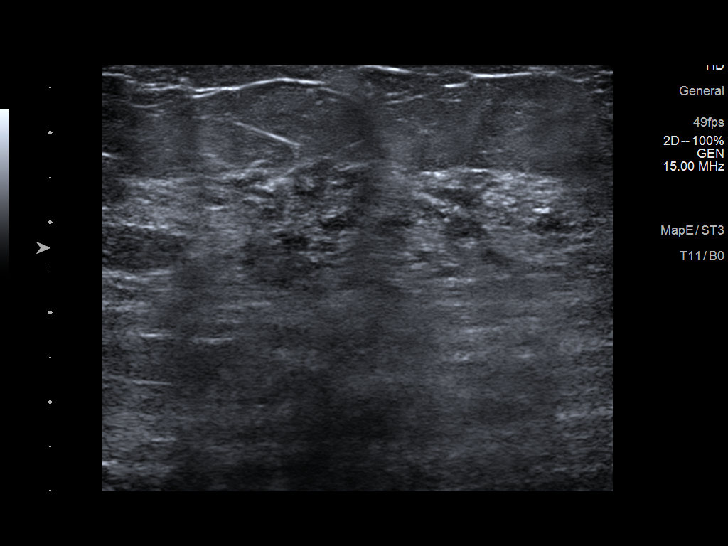
[im 3/35]
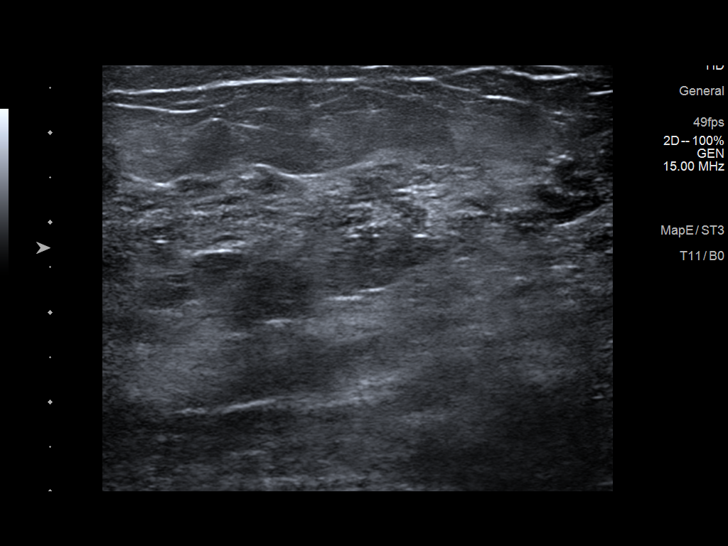
[im 6/35]
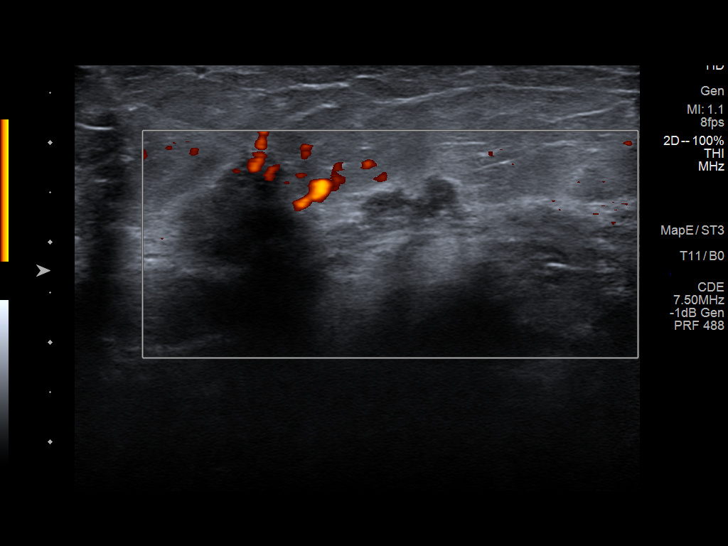
[im 9/35]
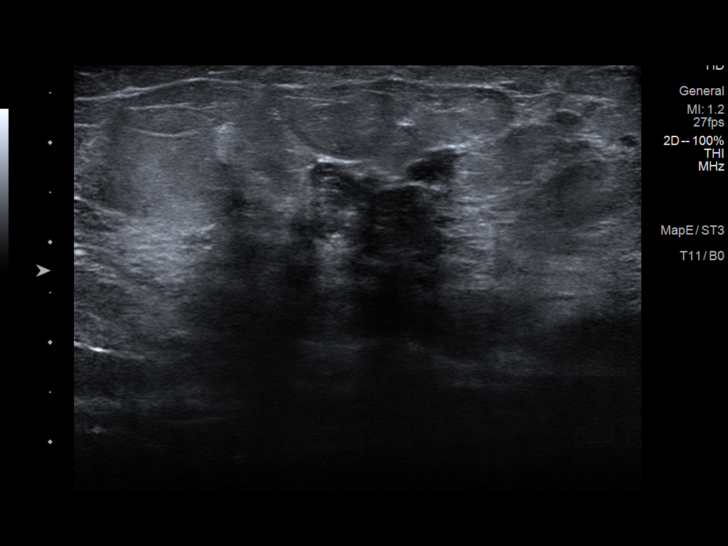
[im 12/35]
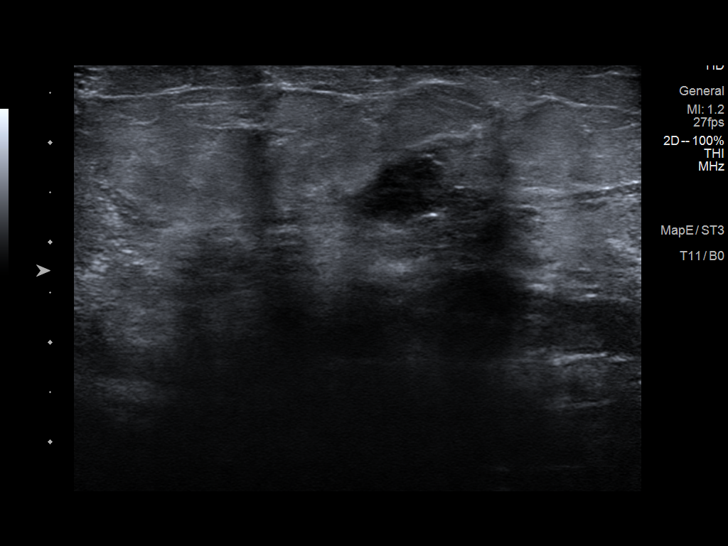
[im 15/35]
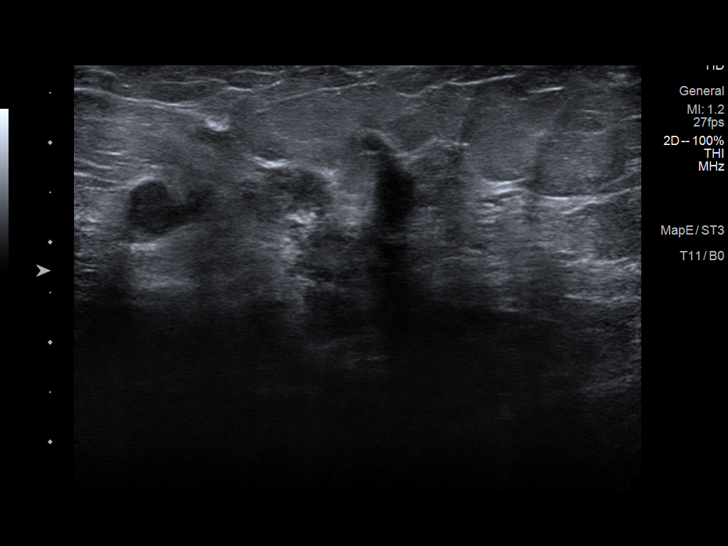
[im 18/35]
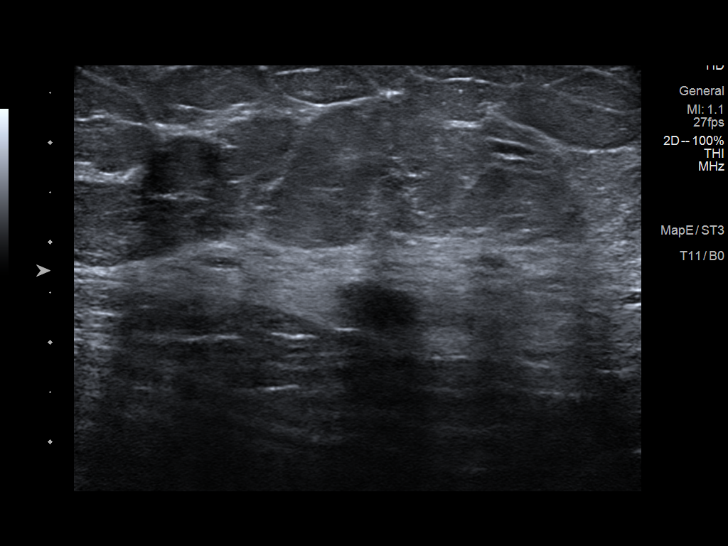
[im 20/35]
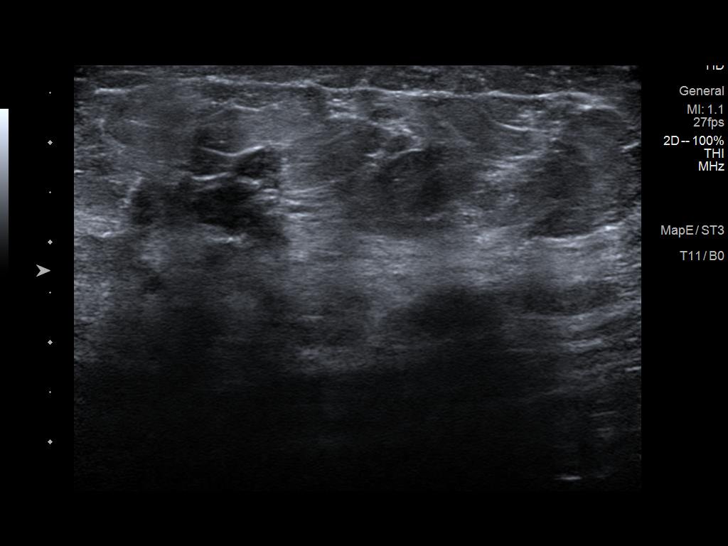
[im 23/35]
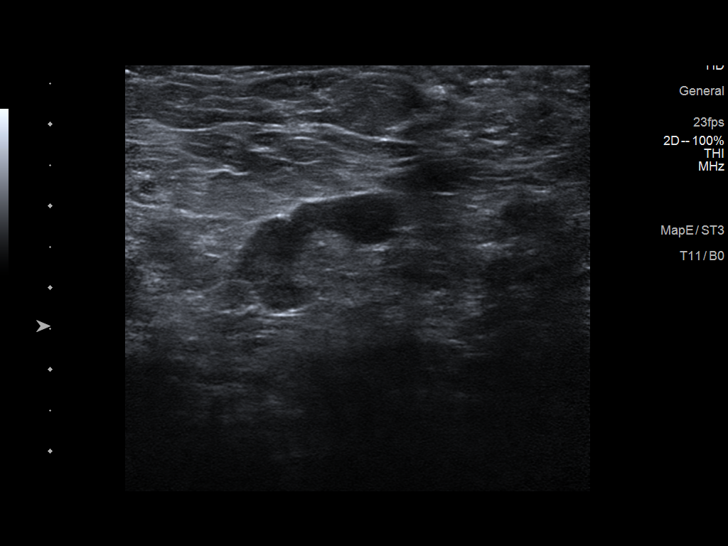
[im 26/35]
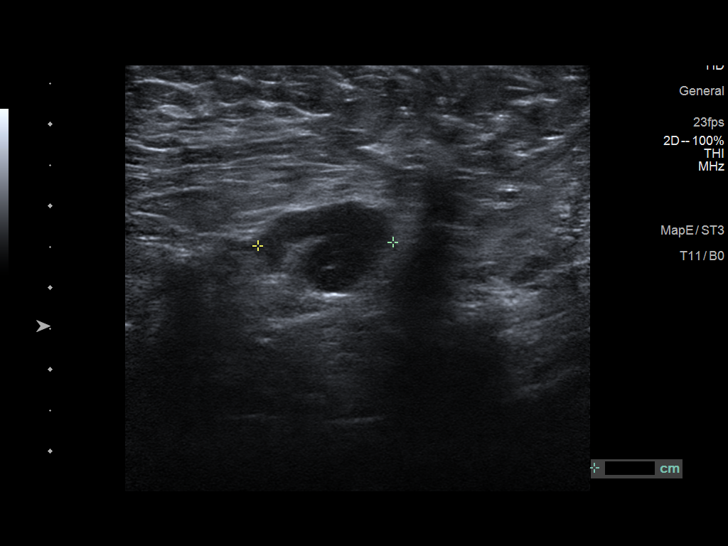
[im 29/35]
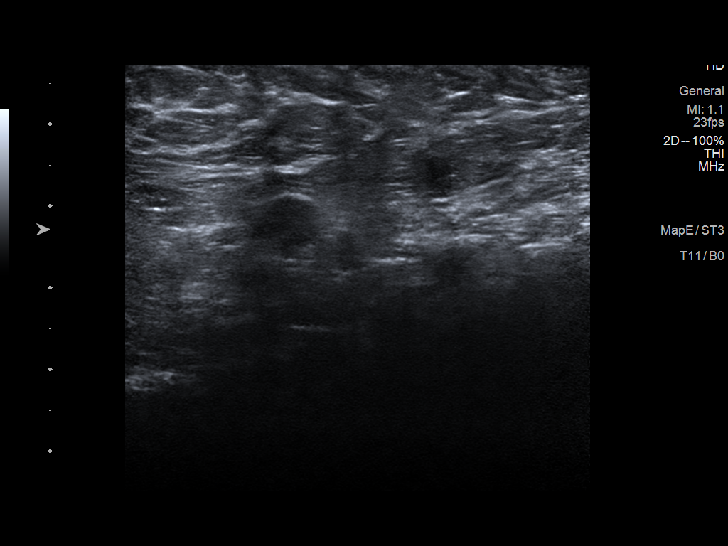
[im 32/35]
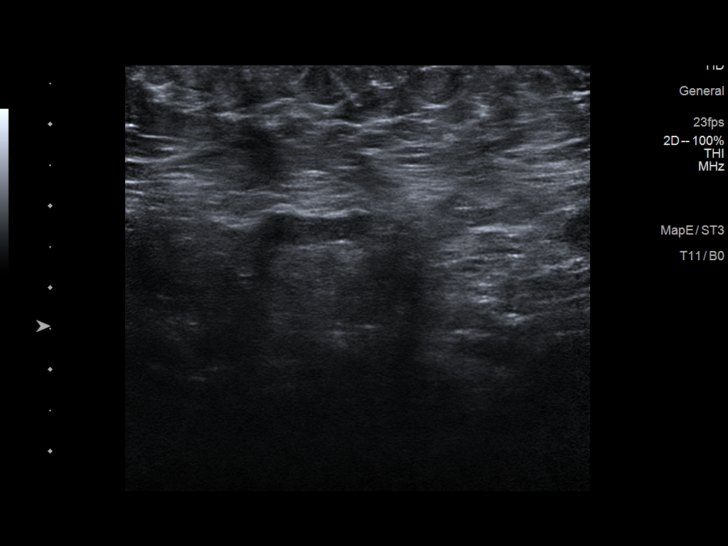
[im 35/35]
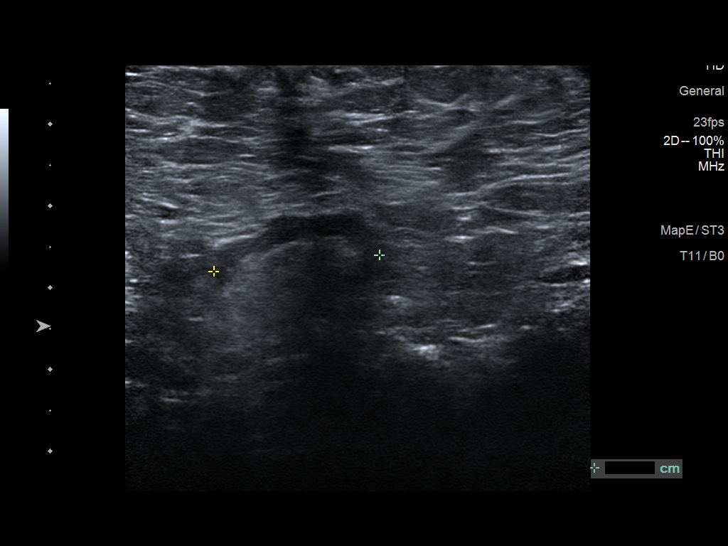

[13 of 25 positions shown; findings below may reference images not displayed]

We have attempted to obtain outside
imaging prior to the [DATE] screening mammogram, however
they are unavailable for comparison.

ACR Breast Density Category c: The breast tissue is heterogeneously
dense, which may obscure small masses.
FINDINGS: There are extensive pleomorphic calcifications involving the
majority of the upper-outer quadrant and much of the upper inner
quadrant of the left breast. The calcifications span about 12 cm in
the medial to lateral dimension. Spot compression tomosynthesis
images through the upper outer quadrant of the left breast
demonstrate confluent distorted tissue without a definite discrete
mass. Several enlarged lymph nodes are seen in the left axilla.

Mammographic images were processed with CAD.

Physical exam of the upper-outer quadrant of the left breast
demonstrates a firm palpable region between 12 and 2 o'clock.

Ultrasound targeted to the left breast at 1 o'clock, 5 cm from the
nipple demonstrates a broad area of hypoechoic irregular
tissue/confluent masses spanning at least 4.5 cm. There is a
separate oval hypoechoic mass with indistinct margins at 2 o'clock,
7 cm from the nipple measuring 0.9 x 0.4 x 0.9 cm. Ultrasound of the
left axilla demonstrates 2 abnormally thickened lymph nodes with
cortices measuring 6-7 mm. There is an additional lymph node with a
borderline cortex of 4 mm.
IMPRESSION: 1. There is a 12 cm span of highly suspicious calcifications
involving the upper outer and upper inner quadrants of the left
breast. On ultrasound, within this area of calcifications, there are
confluent masses at 1 o'clock spanning at least 4.5 cm. An
additional suspicious mass is seen in the left breast at 2 o'clock.

2. There are 2 lymph nodes with thickened cortices in the left
axilla, and an additional lymph node with a borderline thickened
cortex.

RECOMMENDATION:
1. Ultrasound-guided biopsy is recommended for an area within the
confluent masses in the left breast at 1 o'clock.

2. Ultrasound-guided biopsy is recommended for 1 of the thickened
left axillary lymph nodes.

3. Stereotactic biopsy is recommended for the calcifications in the
upper inner quadrant of the left breast.

I have discussed the findings and recommendations with the patient.
If applicable, a reminder letter will be sent to the patient
regarding the next appointment.

BI-RADS CATEGORY  5: Highly suggestive of malignancy.

## 2020-07-01 IMAGING — MG DIGITAL DIAGNOSTIC UNILAT LEFT W/ CAD
8 of 10 series · 8 of 22 positions shown · non-contrast
Comparison: Previous exam(s).

CLINICAL DATA: Screening recall for left breast calcifications.

EXAM:
DIGITAL DIAGNOSTIC LEFT MAMMOGRAM WITH CAD
LEFT BREAST ULTRASOUND

[L ML (1 of 2)]
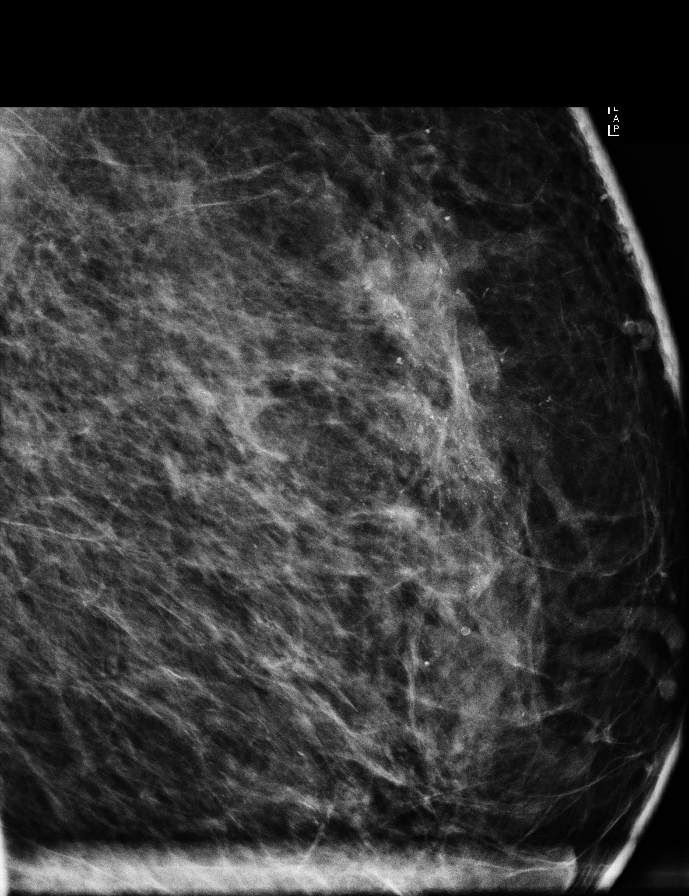

[L CC (1 of 2)]
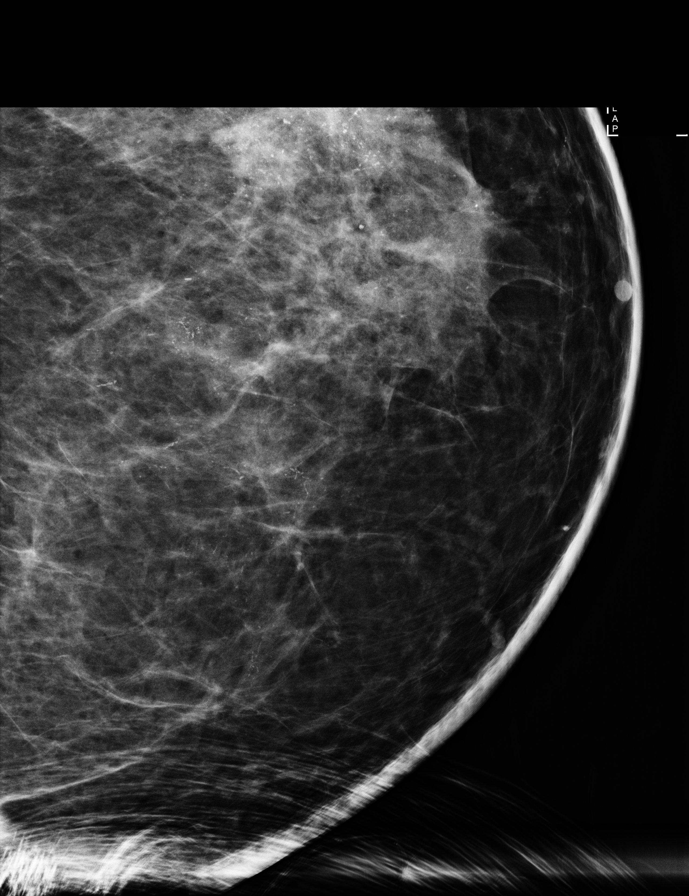

[L CC (2 of 2)]
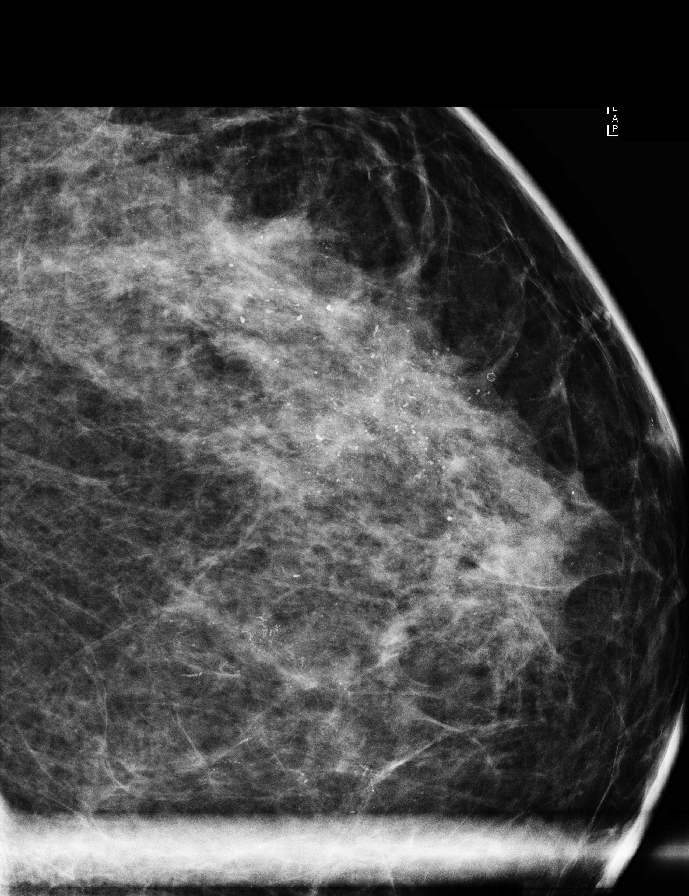

[L ML (2 of 2)]
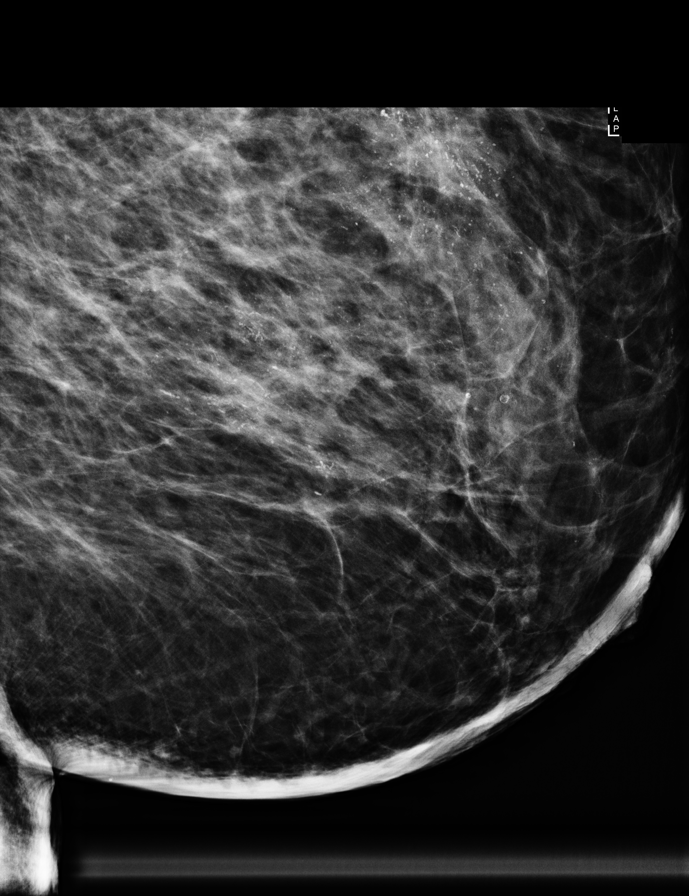

[L CC synth-2D]
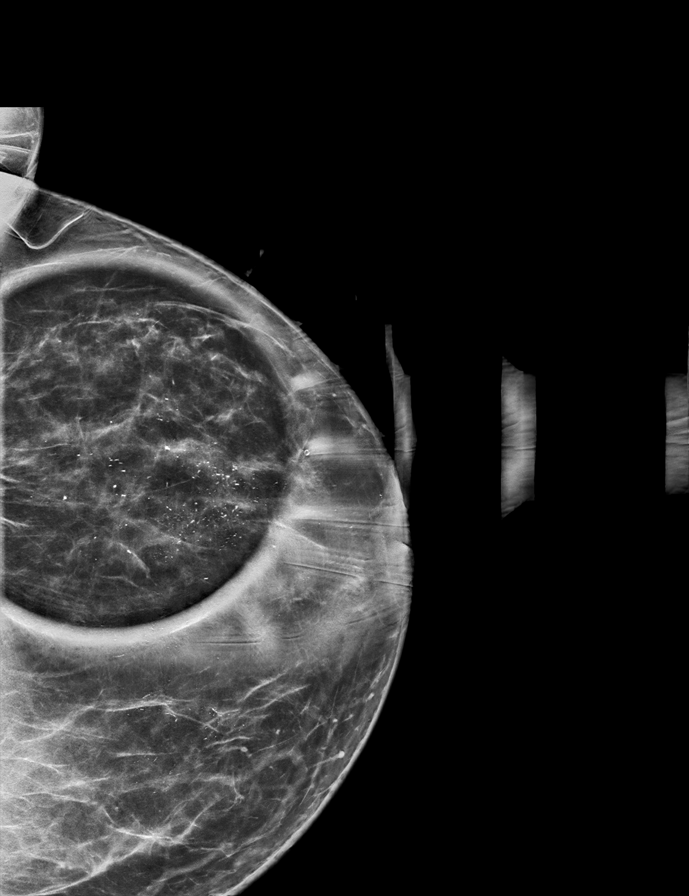

[L MLO synth-2D]
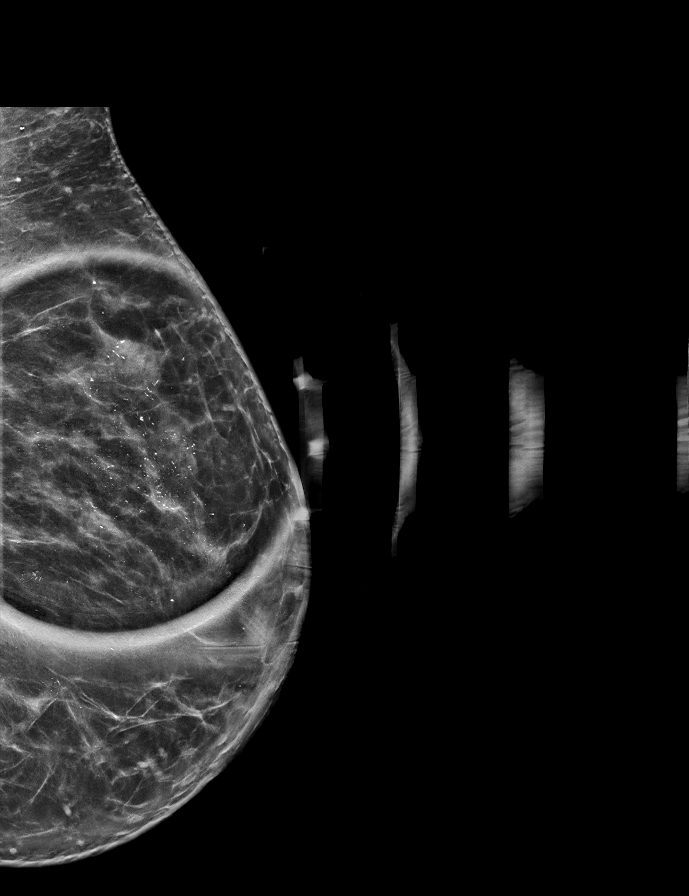

[L ML synth-2D]
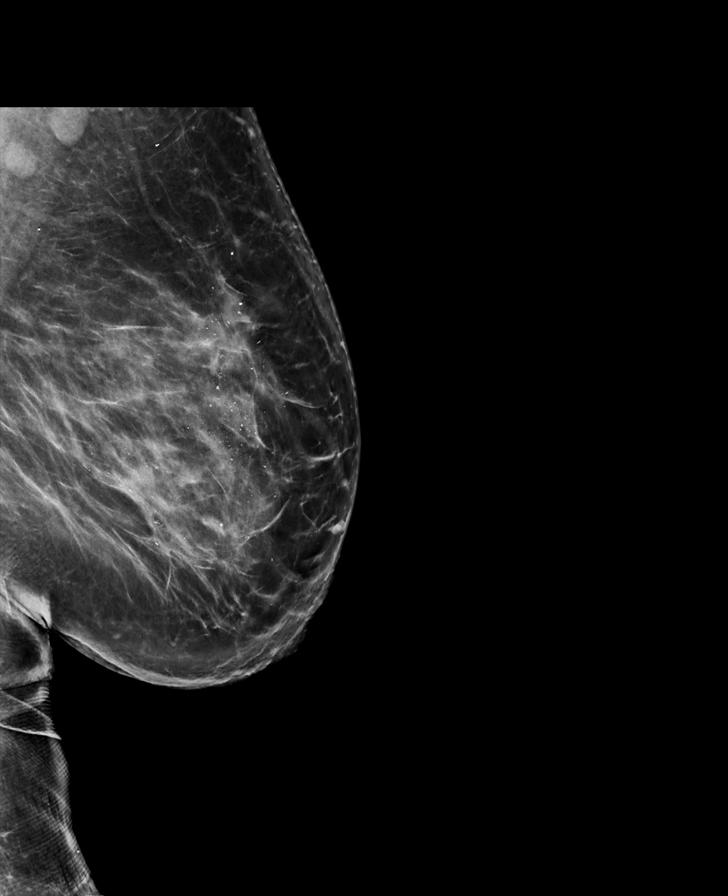

[L MLO tomo · tomo slice 42/83.0]
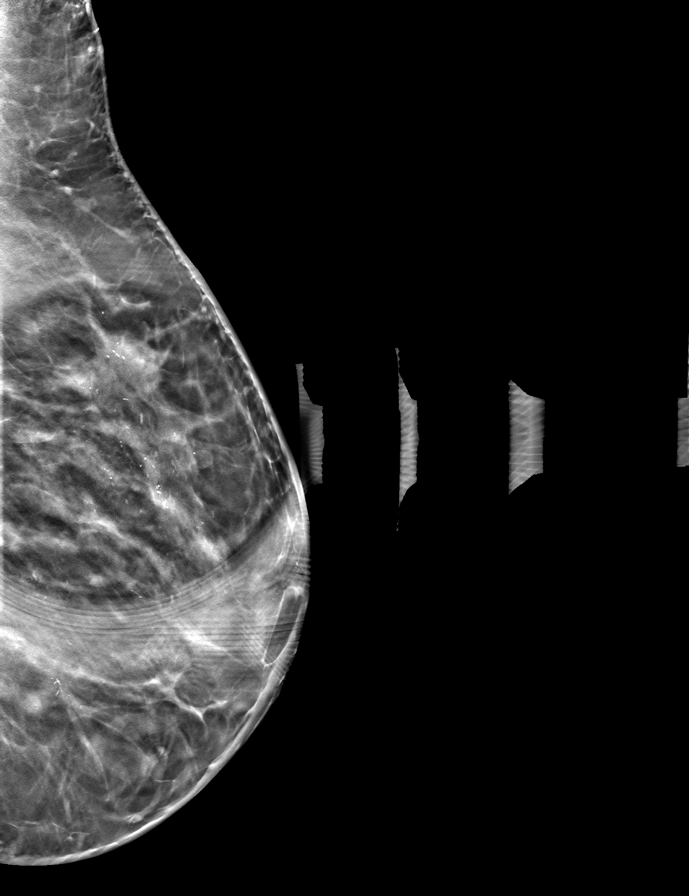

[8 of 22 positions shown; findings below may reference images not displayed]

We have attempted to obtain outside
imaging prior to the [DATE] screening mammogram, however
they are unavailable for comparison.

ACR Breast Density Category c: The breast tissue is heterogeneously
dense, which may obscure small masses.
FINDINGS: There are extensive pleomorphic calcifications involving the
majority of the upper-outer quadrant and much of the upper inner
quadrant of the left breast. The calcifications span about 12 cm in
the medial to lateral dimension. Spot compression tomosynthesis
images through the upper outer quadrant of the left breast
demonstrate confluent distorted tissue without a definite discrete
mass. Several enlarged lymph nodes are seen in the left axilla.

Mammographic images were processed with CAD.

Physical exam of the upper-outer quadrant of the left breast
demonstrates a firm palpable region between 12 and 2 o'clock.

Ultrasound targeted to the left breast at 1 o'clock, 5 cm from the
nipple demonstrates a broad area of hypoechoic irregular
tissue/confluent masses spanning at least 4.5 cm. There is a
separate oval hypoechoic mass with indistinct margins at 2 o'clock,
7 cm from the nipple measuring 0.9 x 0.4 x 0.9 cm. Ultrasound of the
left axilla demonstrates 2 abnormally thickened lymph nodes with
cortices measuring 6-7 mm. There is an additional lymph node with a
borderline cortex of 4 mm.
IMPRESSION: 1. There is a 12 cm span of highly suspicious calcifications
involving the upper outer and upper inner quadrants of the left
breast. On ultrasound, within this area of calcifications, there are
confluent masses at 1 o'clock spanning at least 4.5 cm. An
additional suspicious mass is seen in the left breast at 2 o'clock.

2. There are 2 lymph nodes with thickened cortices in the left
axilla, and an additional lymph node with a borderline thickened
cortex.

RECOMMENDATION:
1. Ultrasound-guided biopsy is recommended for an area within the
confluent masses in the left breast at 1 o'clock.

2. Ultrasound-guided biopsy is recommended for 1 of the thickened
left axillary lymph nodes.

3. Stereotactic biopsy is recommended for the calcifications in the
upper inner quadrant of the left breast.

I have discussed the findings and recommendations with the patient.
If applicable, a reminder letter will be sent to the patient
regarding the next appointment.

BI-RADS CATEGORY  5: Highly suggestive of malignancy.

## 2020-07-06 ENCOUNTER — Encounter (INDEPENDENT_AMBULATORY_CARE_PROVIDER_SITE_OTHER): Payer: Medicare HMO | Admitting: Ophthalmology

## 2020-07-06 DIAGNOSIS — I10 Essential (primary) hypertension: Secondary | ICD-10-CM

## 2020-07-06 DIAGNOSIS — Z961 Presence of intraocular lens: Secondary | ICD-10-CM

## 2020-07-06 DIAGNOSIS — E113513 Type 2 diabetes mellitus with proliferative diabetic retinopathy with macular edema, bilateral: Secondary | ICD-10-CM

## 2020-07-06 DIAGNOSIS — H3581 Retinal edema: Secondary | ICD-10-CM

## 2020-07-06 DIAGNOSIS — H35033 Hypertensive retinopathy, bilateral: Secondary | ICD-10-CM

## 2020-07-08 ENCOUNTER — Telehealth: Payer: Self-pay

## 2020-07-08 NOTE — Telephone Encounter (Signed)
NOTES ON FILE FROM [facility] [phone], SENT REFERRAL TO SCHEDULING 

## 2020-07-14 NOTE — Progress Notes (Signed)
Cardiology Office Note:   Date:  07/15/2020  NAME:  Cheryl Blankenship    MRN: [id number] DOB:  1951/08/16   PCP:  [name], PA-C  Cardiologist:  No primary care provider on file.   Referring MD: Cipriano Mile, NP   Chief Complaint  Patient presents with  . New Patient (Initial Visit)  . Headache  . Chest Pain    Discomfort.   History of Present Illness:   Cheryl Blankenship is a 69 y.o. female with a hx of DM, HTN, HLD who is being seen today for the evaluation of CHF at the request of Evette Doffing, Truddie Crumble, PA-C. Evaluated at [facility] with normal stress test and normal echo in 08/2019.  She presents to establish care.  She has uncontrolled diabetes and hypertension.  Her blood pressure seems to be a little better today.  She is on several blood pressure medications.  She reports that she does get short of breath with exertion.  She takes Lasix 40 mg daily.  She has no evidence of lower extremity edema.  She had a normal echocardiogram as well as normal stress test in December 2020.  She reports she possibly has sleep apnea.  Her EKG today demonstrates normal sinus rhythm with LVH.  She also reports intermittent chest pain.  Described as sharp pain.  Can occur at any time.  She reports she can get it with exertion or sitting.  She is trying to avoid certain foods.  She is instructed to take aspirin for the pain.  Apparently this helps.  She is also trying to avoid high salt intake.  She is only able to walk 1 block before she gets short of breath.  She is deconditioned.  She also has morbidly obese with a BMI of 38.  Problem List 1. DM -A1c 11.5 -Normal stress test, normal echo 08/2019 ([facility]) 2. HTN 3. HLD -T chol 154, TG 139, HDL 60, LDL 66  Past Medical History: Past Medical History:  Diagnosis Date  . Anxiety   . CHF (congestive heart failure) ([place])   . Diabetes mellitus without complication ([place])   . Hypertension     Past Surgical History: Past  Surgical History:  Procedure Laterality Date  . CHOLECYSTECTOMY    . STOMACH SURGERY      Current Medications: Current Meds  Medication Sig  . amLODipine (NORVASC) 10 MG tablet Take 10 mg by mouth daily.  Marland Kitchen aspirin 81 MG chewable tablet Chew 81 mg by mouth daily.  Marland Kitchen atorvastatin (LIPITOR) 40 MG tablet Take 40 mg by mouth daily.  . Cholecalciferol 25 MCG (1000 UT) capsule Take 1,000 Units by mouth daily.  . DULoxetine (CYMBALTA) 30 MG capsule Take 30 mg by mouth daily.  . empagliflozin (JARDIANCE) 25 MG TABS tablet Take 25 mg by mouth daily.  . folic acid (FOLVITE) 1 MG tablet Take 1 tablet by mouth daily.  . furosemide (LASIX) 40 MG tablet Take 40 mg by mouth daily.  Marland Kitchen gabapentin (NEURONTIN) 400 MG capsule Take 400 mg by mouth in the morning and at bedtime.  Marland Kitchen glipiZIDE (GLUCOTROL XL) 5 MG 24 hr tablet Take 5 mg by mouth daily.  . insulin glargine (LANTUS SOLOSTAR) 100 UNIT/ML Solostar Pen Inject into the skin.  Marland Kitchen insulin regular (NOVOLIN R) 100 units/mL injection Inject into the skin.  Marland Kitchen losartan (COZAAR) 100 MG tablet Take 100 mg by mouth daily.  . metFORMIN (GLUCOPHAGE) 1000 MG tablet Take 1,000 mg by mouth in  the morning and at bedtime.  . metoprolol tartrate (LOPRESSOR) 50 MG tablet Take 50 mg by mouth in the morning and at bedtime.  Marland Kitchen omeprazole (PRILOSEC) 20 MG capsule Take 20 mg by mouth daily as needed.  . ondansetron (ZOFRAN-ODT) 4 MG disintegrating tablet Take 4 mg by mouth as needed.  . potassium chloride (KLOR-CON) 10 MEQ tablet Take 20 mEq by mouth daily. DAILY WITH LASIX  . pregabalin (LYRICA) 75 MG capsule Take 75 mg by mouth daily.  Marland Kitchen tiZANidine (ZANAFLEX) 4 MG tablet Take 4 mg by mouth daily as needed.  . traMADol (ULTRAM) 50 MG tablet Take 50 mg by mouth daily.     Allergies:    Lisinopril   Social History: Social History   Socioeconomic History  . Marital status: Married    Spouse name: Not on file  . Number of children: 5  . Years of education: Not on  file  . Highest education level: Not on file  Occupational History  . Occupation: retired Human resources officer  Tobacco Use  . Smoking status: Former Smoker    Years: 20.00  . Smokeless tobacco: Never Used  Substance and Sexual Activity  . Alcohol use: Never  . Drug use: Never  . Sexual activity: Not on file  Other Topics Concern  . Not on file  Social History Narrative  . Not on file   Social Determinants of Health   Financial Resource Strain:   . Difficulty of Paying Living Expenses: Not on file  Food Insecurity:   . Worried About Charity fundraiser in the Last Year: Not on file  . Ran Out of Food in the Last Year: Not on file  Transportation Needs:   . Lack of Transportation (Medical): Not on file  . Lack of Transportation (Non-Medical): Not on file  Physical Activity:   . Days of Exercise per Week: Not on file  . Minutes of Exercise per Session: Not on file  Stress:   . Feeling of Stress : Not on file  Social Connections:   . Frequency of Communication with Friends and Family: Not on file  . Frequency of Social Gatherings with Friends and Family: Not on file  . Attends Religious Services: Not on file  . Active Member of Clubs or Organizations: Not on file  . Attends Archivist Meetings: Not on file  . Marital Status: Not on file    Family History: The patient's family history includes Breast cancer in her sister; Diabetes in her sister; Heart attack in her brother, mother, and sister.  ROS:   All other ROS reviewed and negative. Pertinent positives noted in the HPI.     EKGs/Labs/Other Studies Reviewed:   The following studies were personally reviewed by me today:  EKG:  EKG is ordered today.  The ekg ordered today demonstrates normal sinus rhythm, heart rate 66, LVH by voltage, and was personally reviewed by me.   NM SPECT 08/22/2019 [facility]) Normal regadenoson myocardial perfusion with Tc-71m sestamibi  imaging.  2. No evidence of ischemia or infarct on  myocardial perfusion imaging.  3. Type of stress was regadenoson.   4. Stress testing induced non-cardiac chest pain and a non-diagnostic  ECG response due to baseline ST and T wave abnormalities.  5. Global left ventricular systolic function was normal, with an EF of  53%.  6. Gated imaging under post-stress conditions demonstrated normal wall  motion.   7. TID Ratio 0.92.   Echo 08/22/2019 ([place])  1. There is normal left ventricular systolic function.  2. The EF is estimated at 55-60%.  3. Left ventricle septal thickness is mildly increased.   Recent Labs: No results found for requested labs within last 8760 hours.   Recent Lipid Panel No results found for: CHOL, TRIG, HDL, CHOLHDL, VLDL, LDLCALC, LDLDIRECT  Physical Exam:   VS:  BP (!) 144/90 (BP Location: Left Arm, Patient Position: Sitting, Cuff Size: Large)   Pulse 66   Ht 5\' 3"  (1.6 m)   Wt 218 lb (98.9 kg)   BMI 38.62 kg/m    Wt Readings from Last 3 Encounters:  07/15/20 218 lb (98.9 kg)    General: Well nourished, well developed, in no acute distress Heart: Atraumatic, normal size  Eyes: PEERLA, EOMI  Neck: Supple, no JVD Endocrine: No thryomegaly Cardiac: Normal S1, S2; RRR; no murmurs, rubs, or gallops Lungs: Clear to auscultation bilaterally, no wheezing, rhonchi or rales  Abd: Soft, nontender, no hepatomegaly  Ext: No edema, pulses 2+ Musculoskeletal: No deformities, BUE and BLE strength normal and equal Skin: Warm and dry, no rashes   Neuro: Alert and oriented to person, place, time, and situation, CNII-XII grossly intact, no focal deficits  Psych: Normal mood and affect   ASSESSMENT:   Kylina Vultaggio is a 69 y.o. female who presents for the following: 1. Chronic diastolic heart failure (HCC)   2. Chest pain of uncertain etiology   3. SOB (shortness of breath) on exertion   4. Mixed hyperlipidemia   5. Primary hypertension   6. Obesity (BMI 30-39.9)     PLAN:   1. Chronic diastolic heart  failure ([place]) -She presents with symptoms of diastolic heart failure.  Had a normal stress test in 2020.  Also had an echocardiogram that was interpreted with normal ejection fraction.  Symptoms are likely related to uncontrolled diabetes and hypertension.  She seems to be euvolemic today.  I would recommend to continue her blood pressure agents as well as to continue Lasix 40 mg daily.  She does need to work on watching her salt intake.  She also is on an SGLT2 inhibitor which has shown improvement in HFpEF and HFrEF.  2. Chest pain of uncertain etiology -She presents with atypical chest pain.  Stress test in 2020 was normal.  Suspect this is noncardiac.  No further work-up given normal stress test recently.  3. SOB (shortness of breath) on exertion -Suspect this is related to morbid obesity, heart failure with preserved ejection fraction and deconditioning.  I encouraged structured exercise.  She seems to be doing okay with her salt balance.  4. Mixed hyperlipidemia -Most recent LDL cholesterol at goal.  Continue current agents.  5. Primary hypertension -BP is well controlled.  No change in medicines.  6. Obesity (BMI 30-39.9) -Advised to continue with diet and exercise.  Disposition: Return in about 6 months (around 01/13/2021).  Medication Adjustments/Labs and Tests Ordered: Current medicines are reviewed at length with the patient today.  Concerns regarding medicines are outlined above.  No orders of the defined types were placed in this encounter.  No orders of the defined types were placed in this encounter.   Patient Instructions  Medication Instructions:  Continue same medications *If you need a refill on your cardiac medications before your next appointment, please call your pharmacy*   Lab Work: None ordered   Testing/Procedures: None ordered   Follow-Up: At [facility] [place], you and your health needs are our priority.  As part of  our continuing mission to provide  you with exceptional heart care, we have created designated Provider Care Teams.  These Care Teams include your primary Cardiologist (physician) and Advanced Practice Providers (APPs -  Physician Assistants and Nurse Practitioners) who all work together to provide you with the care you need, when you need it.  We recommend signing up for the patient portal called "MyChart".  Sign up information is provided on this After Visit Summary.  MyChart is used to connect with patients for Virtual Visits (Telemedicine).  Patients are able to view lab/test results, encounter notes, upcoming appointments, etc.  Non-urgent messages can be sent to your provider as well.   To learn more about what you can do with MyChart, go to [email or web address].    Your next appointment:  6 months   Call in Feb to schedule April appointment   The format for your next appointment:Office   Provider:  Dr.[name]     Signed, Cheryl Blankenship, Elizabeth  [address] [place], [place] [address] [phone]  07/15/2020 2:37 PM

## 2020-07-15 ENCOUNTER — Ambulatory Visit (INDEPENDENT_AMBULATORY_CARE_PROVIDER_SITE_OTHER): Payer: Medicare HMO | Admitting: Cardiovascular Disease

## 2020-07-15 ENCOUNTER — Encounter: Payer: Self-pay | Admitting: Cardiovascular Disease

## 2020-07-15 ENCOUNTER — Other Ambulatory Visit: Payer: Self-pay

## 2020-07-15 VITALS — BP 144/90 | HR 66 | Ht 63.0 in | Wt 218.0 lb

## 2020-07-15 DIAGNOSIS — R9431 Abnormal electrocardiogram [ECG] [EKG]: Secondary | ICD-10-CM

## 2020-07-15 DIAGNOSIS — R079 Chest pain, unspecified: Secondary | ICD-10-CM | POA: Diagnosis not present

## 2020-07-15 DIAGNOSIS — I5032 Chronic diastolic (congestive) heart failure: Secondary | ICD-10-CM

## 2020-07-15 DIAGNOSIS — R0602 Shortness of breath: Secondary | ICD-10-CM | POA: Diagnosis not present

## 2020-07-15 DIAGNOSIS — E669 Obesity, unspecified: Secondary | ICD-10-CM

## 2020-07-15 DIAGNOSIS — I10 Essential (primary) hypertension: Secondary | ICD-10-CM

## 2020-07-15 DIAGNOSIS — E782 Mixed hyperlipidemia: Secondary | ICD-10-CM | POA: Diagnosis not present

## 2020-07-15 NOTE — Patient Instructions (Signed)
Medication Instructions:  Continue same medications *If you need a refill on your cardiac medications before your next appointment, please call your pharmacy*   Lab Work: None ordered   Testing/Procedures: None ordered   Follow-Up: At [facility], you and your health needs are our priority.  As part of our continuing mission to provide you with exceptional heart care, we have created designated Provider Care Teams.  These Care Teams include your primary Cardiologist (physician) and Advanced Practice Providers (APPs -  Physician Assistants and Nurse Practitioners) who all work together to provide you with the care you need, when you need it.  We recommend signing up for the patient portal called "MyChart".  Sign up information is provided on this After Visit Summary.  MyChart is used to connect with patients for Virtual Visits (Telemedicine).  Patients are able to view lab/test results, encounter notes, upcoming appointments, etc.  Non-urgent messages can be sent to your provider as well.   To learn more about what you can do with MyChart, go to [email or web address].    Your next appointment:  6 months   Call in Feb to schedule April appointment   The format for your next appointment:Office   Provider:  Dr.[name]

## 2020-07-16 ENCOUNTER — Ambulatory Visit
Admission: RE | Admit: 2020-07-16 | Discharge: 2020-07-16 | Disposition: A | Discharge status: Home / Self Care | Payer: Medicare HMO | Source: Ambulatory Visit | Attending: Student | Admitting: Student

## 2020-07-16 ENCOUNTER — Other Ambulatory Visit: Payer: Self-pay | Admitting: Student

## 2020-07-16 DIAGNOSIS — R928 Other abnormal and inconclusive findings on diagnostic imaging of breast: Secondary | ICD-10-CM

## 2020-07-16 IMAGING — MG MM BREAST BX W LOC DEV 1ST LESION IMAGE BX SPEC STEREO GUIDE*L*
6 of 15 series · 6 of 31 positions shown · non-contrast
Comparison: Previous exams.
COMPARISON: Previous exams.

Addendum:
CLINICAL DATA: 12 cm area calcifications highly suspicious for
malignancy in the upper outer and upper inner quadrants of the left
breast at recent mammography. The portion in the upper inner
quadrant spans 5.8 cm. There was also a 4.5 cm area of confluent
masses in the 1 o'clock position of the left breast and additional 9
mm mass in the 2 o'clock position of the left breast recent
ultrasound. Also demonstrated were 2 abnormal left axillary lymph
nodes suspicious for metastatic nodes at ultrasound.

EXAM:
LEFT BREAST STEREOTACTIC CORE NEEDLE BIOPSY
LEFT BREAST ULTRASOUND-GUIDED CORE NEEDLE BIOPSY
LEFT AXILLA ULTRASOUND-GUIDED CORE NEEDLE BIOPSY

[L (1 of 6)]
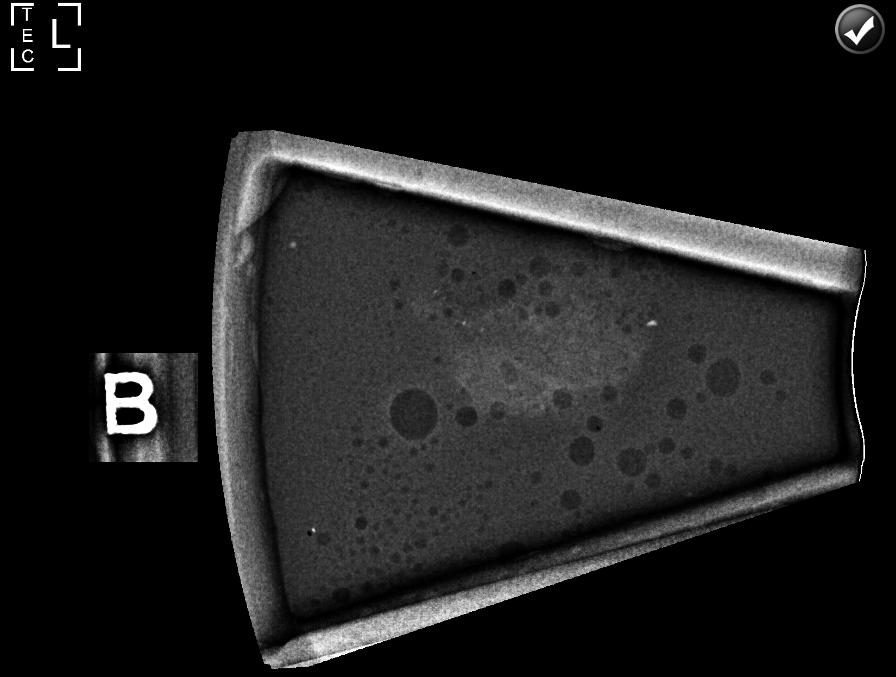

[L (2 of 6)]
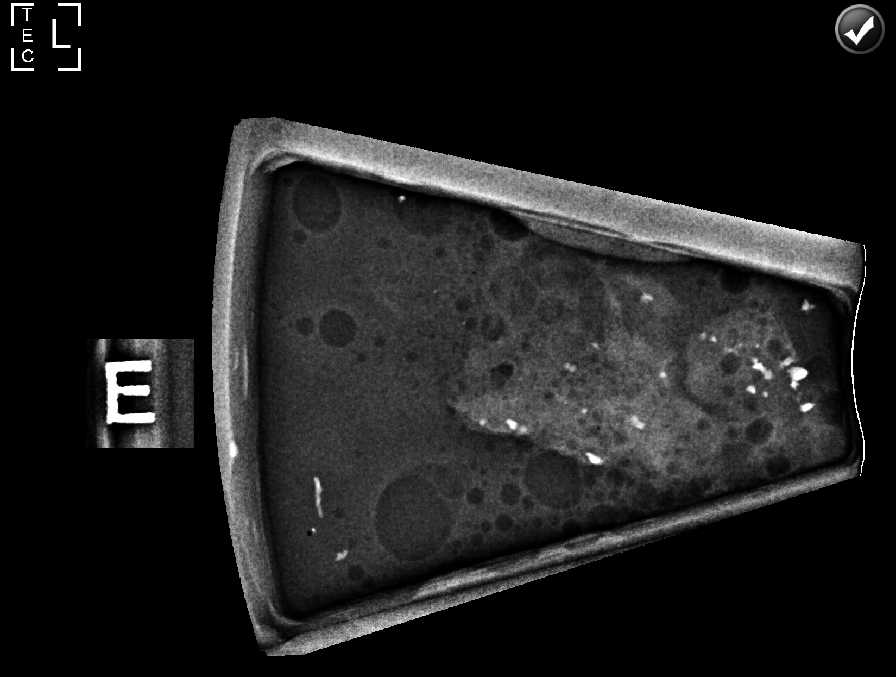

[L (3 of 6)]
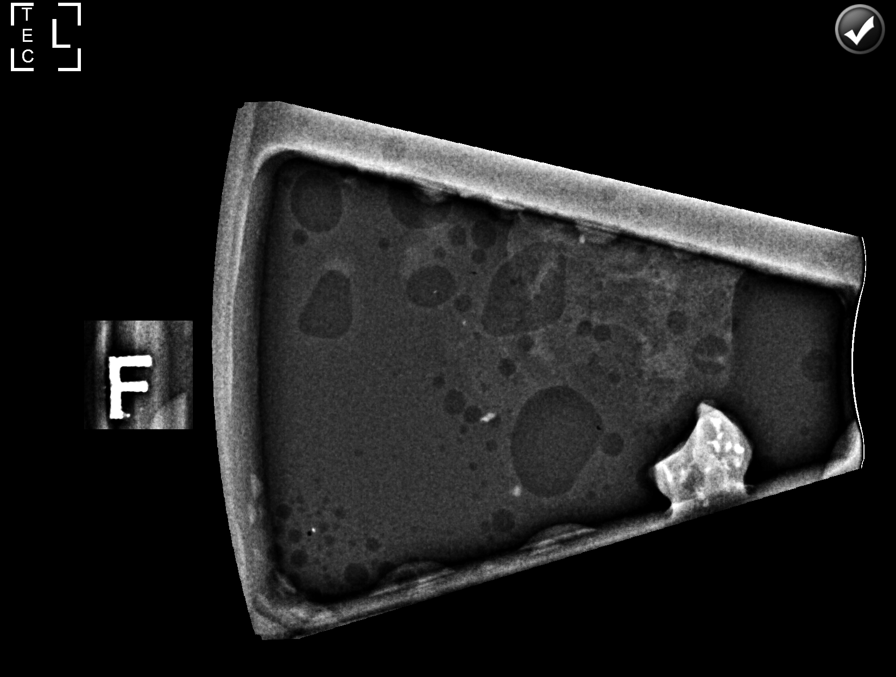

[L (4 of 6)]
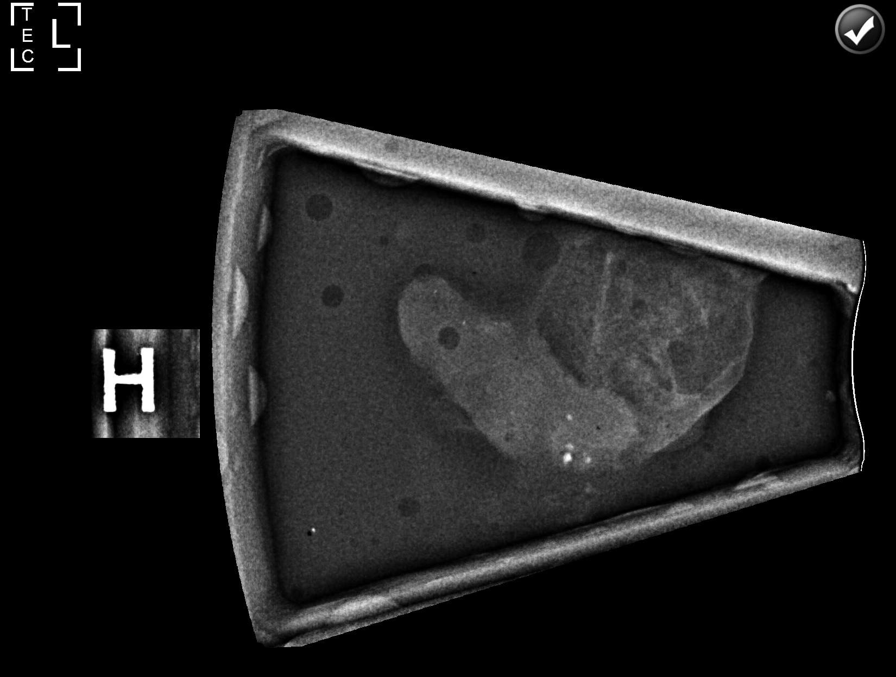

[L (5 of 6)]
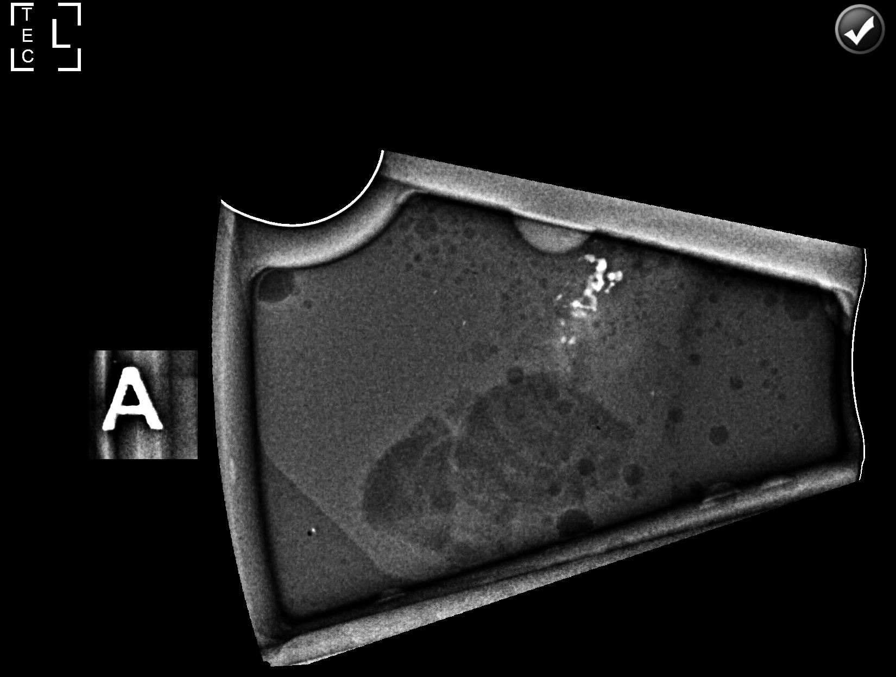

[L (6 of 6)]
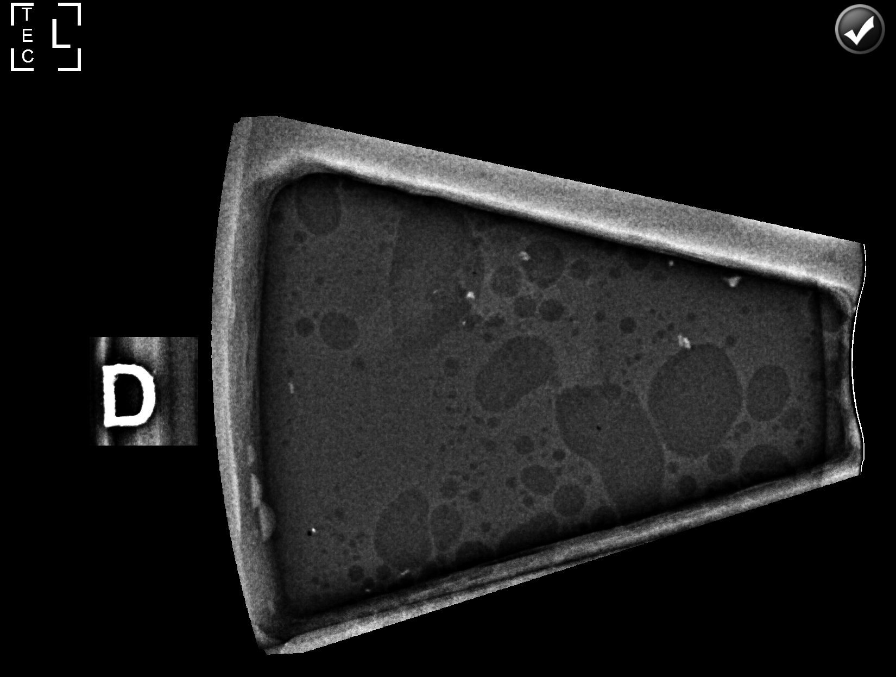

[6 of 31 positions shown; findings below may reference images not displayed]

FINDINGS: The patient and I discussed the procedures of stereotactic-guided
biopsy and ultrasound guided biopsy including benefits and
alternatives. We discussed the high likelihood of a successful
procedures. We discussed the risks of the procedures including
infection, bleeding, tissue injury, clip migration, and inadequate
sampling. Informed written consent was given. The usual time out
protocol was performed immediately prior to the procedures.

SITE 1: 5.8 CM AREA OF CALCIFICATIONS IN THE UPPER INNER QUADRANT OF
THE LEFT BREAST

Lesion quadrant: Upper inner quadrant

Using sterile technique and 1% Lidocaine as local anesthetic, under
stereotactic guidance, a 9 gauge vacuum assisted device was used to
perform core needle biopsy of a 5.8 cm area of calcifications in the
upper inner quadrant of the left breast which is part of the 12 cm
area of calcifications involving the upper outer and upper inner
quadrants of the breast, using a medial approach. Specimen
radiograph was performed showing a large number of calcifications in
multiple specimens. Specimens with calcifications are identified for
pathology.

At the conclusion of the procedure, a coil shaped tissue marker clip
was deployed into the biopsy cavity. Follow-up 2-view mammogram was
performed and dictated separately.

SITE 2: 4.5 CM AREA OF CONFLUENT MASSES IN THE 1 O'CLOCK POSITION OF
THE LEFT BREAST

Lesion quadrant: Upper outer quadrant

Using sterile technique and 1% Lidocaine as local anesthetic, under
direct ultrasound visualization, a 12 gauge PAW device was
used to perform biopsy of the recently demonstrated 4.5 cm area of
confluent masses in the 1 o'clock position of the left breast using
a caudal approach. At the conclusion of the procedure a ribbon
shaped tissue marker clip was deployed into the biopsy cavity.
Follow up 2 view mammogram was performed and dictated separately.

SITE 3: ABNORMAL LEFT AXILLARY LYMPH NODE

Using sterile technique and 1% Lidocaine as local anesthetic, under
direct ultrasound visualization, a 12 gauge PAW device was
used to perform biopsy of 1 of the recently demonstrated 2 abnormal
appearing left axillary lymph nodes using a caudal approach. At the
conclusion of the procedure a heart shaped tissue marker clip was
deployed into the biopsy cavity. Follow up 2 view mammogram was
performed and dictated separately.
IMPRESSION: Stereotactic-guided biopsy of a 5.8 cm area of calcifications in the
upper inner quadrant of the left breast, 4.5 cm area of confluent
masses in the 1 o'clock position of the left breast and an abnormal
appearing left axillary lymph node. No apparent complications.

ADDENDUM:
Pathology revealed HIGH GRADE DUCTAL CARCINOMA ARISING IN A
FIBROADENOMA WITH CALCIFICATIONS AND NECROSIS of the Left breast,
upper inner quadrant. This was found to be concordant by Dr. PAW
PAW.

Pathology revealed GRADE II INVASIVE DUCTAL CARCINOMA of the Left
breast, 1 o'clock. This was found to be concordant by Dr. PAW
PAW.

Pathology revealed METASTATIC CARCINOMA INVOLVING A LYMPH NODE of
the Left axilla. This was found to be concordant by Dr. PAW.

Pathology results were discussed with the patient and her PAW
PAW by telephone, per request. The patient reported doing well
after the biopsies with tenderness at the sites. Post biopsy
instructions and care were reviewed and questions were answered. The
patient was encouraged to call [REDACTED] for any additional concerns. My direct phone number was
provided.

The patient was referred to [REDACTED]
[REDACTED] at [REDACTED] on
[DATE].

Pathology results reported by PAW, RN on [DATE].

*** End of Addendum ***
FINDINGS: The patient and I discussed the procedures of stereotactic-guided
biopsy and ultrasound guided biopsy including benefits and
alternatives. We discussed the high likelihood of a successful
procedures. We discussed the risks of the procedures including
infection, bleeding, tissue injury, clip migration, and inadequate
sampling. Informed written consent was given. The usual time out
protocol was performed immediately prior to the procedures.

SITE 1: 5.8 CM AREA OF CALCIFICATIONS IN THE UPPER INNER QUADRANT OF
THE LEFT BREAST

Lesion quadrant: Upper inner quadrant

Using sterile technique and 1% Lidocaine as local anesthetic, under
stereotactic guidance, a 9 gauge vacuum assisted device was used to
perform core needle biopsy of a 5.8 cm area of calcifications in the
upper inner quadrant of the left breast which is part of the 12 cm
area of calcifications involving the upper outer and upper inner
quadrants of the breast, using a medial approach. Specimen
radiograph was performed showing a large number of calcifications in
multiple specimens. Specimens with calcifications are identified for
pathology.

At the conclusion of the procedure, a coil shaped tissue marker clip
was deployed into the biopsy cavity. Follow-up 2-view mammogram was
performed and dictated separately.

SITE 2: 4.5 CM AREA OF CONFLUENT MASSES IN THE 1 O'CLOCK POSITION OF
THE LEFT BREAST

Lesion quadrant: Upper outer quadrant

Using sterile technique and 1% Lidocaine as local anesthetic, under
direct ultrasound visualization, a 12 gauge PAW device was
used to perform biopsy of the recently demonstrated 4.5 cm area of
confluent masses in the 1 o'clock position of the left breast using
a caudal approach. At the conclusion of the procedure a ribbon
shaped tissue marker clip was deployed into the biopsy cavity.
Follow up 2 view mammogram was performed and dictated separately.

SITE 3: ABNORMAL LEFT AXILLARY LYMPH NODE

Using sterile technique and 1% Lidocaine as local anesthetic, under
direct ultrasound visualization, a 12 gauge PAW device was
used to perform biopsy of 1 of the recently demonstrated 2 abnormal
appearing left axillary lymph nodes using a caudal approach. At the
conclusion of the procedure a heart shaped tissue marker clip was
deployed into the biopsy cavity. Follow up 2 view mammogram was
performed and dictated separately.
IMPRESSION: Stereotactic-guided biopsy of a 5.8 cm area of calcifications in the
upper inner quadrant of the left breast, 4.5 cm area of confluent
masses in the 1 o'clock position of the left breast and an abnormal
appearing left axillary lymph node. No apparent complications.

## 2020-07-16 IMAGING — MG MM BREAST LOCALIZATION CLIP
4 series · 4 of 12 positions shown · non-contrast
Comparison: Previous exam(s).

CLINICAL DATA: Status post stereotactic guided core needle biopsy
of a 5.8 cm area of calcifications in the upper inner quadrant of
the left breast, ultrasound-guided core needle biopsy of a 4.5 cm
area of confluent masses in the 1 o'clock position of the left
breast ultrasound-guided core needle biopsy of an abnormal appearing
left axillary lymph node.

EXAM:
DIAGNOSTIC LEFT MAMMOGRAM POST STEREOTACTIC AND ULTRASOUND-GUIDED
CORE NEEDLE BIOPSIES

[L MLO synth-2D]
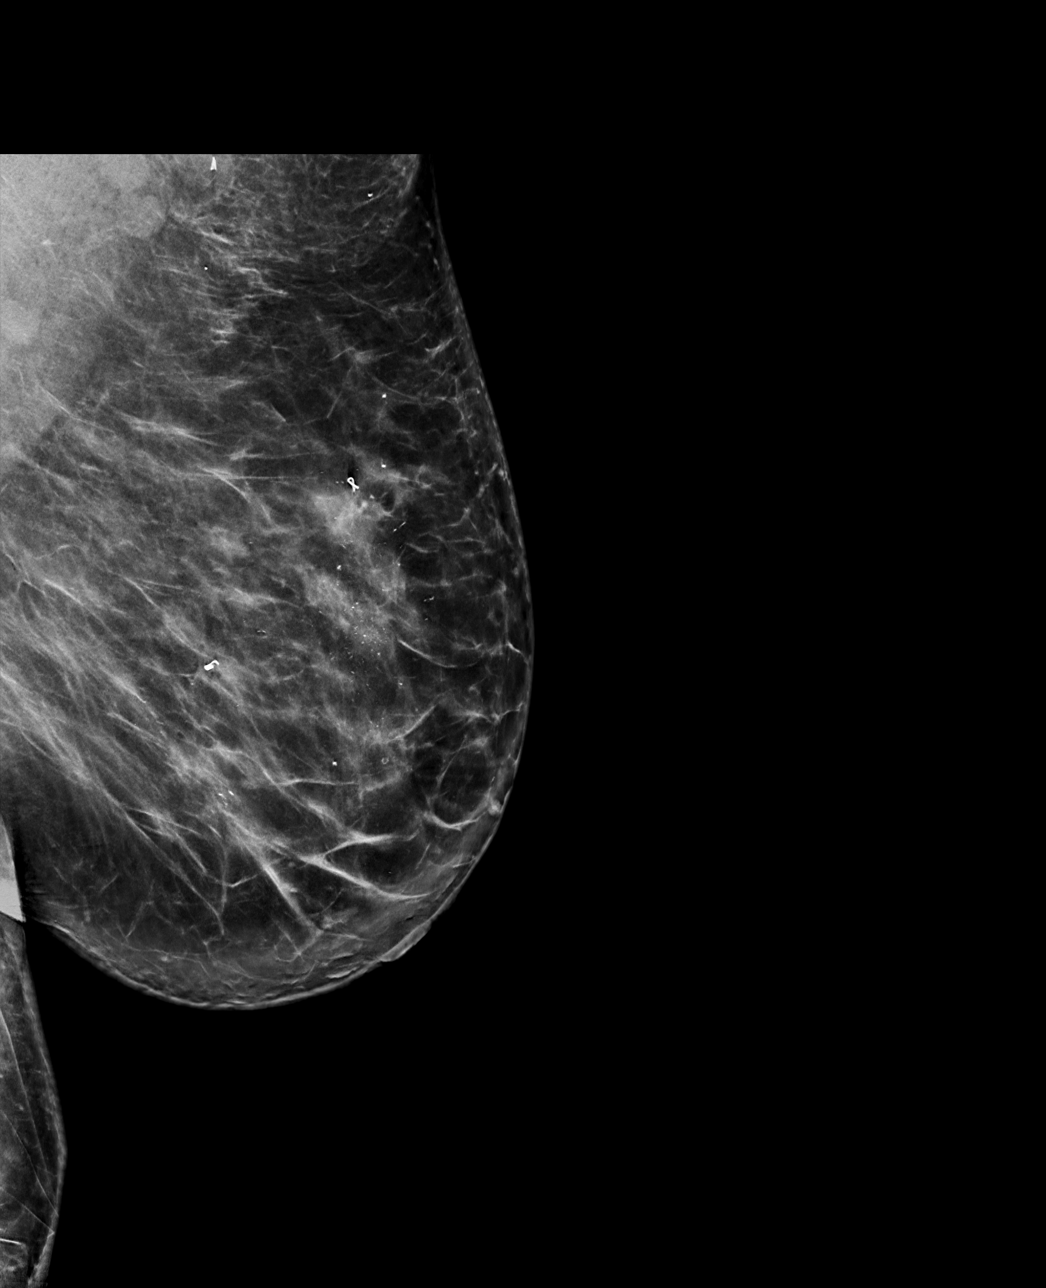

[L CC synth-2D]
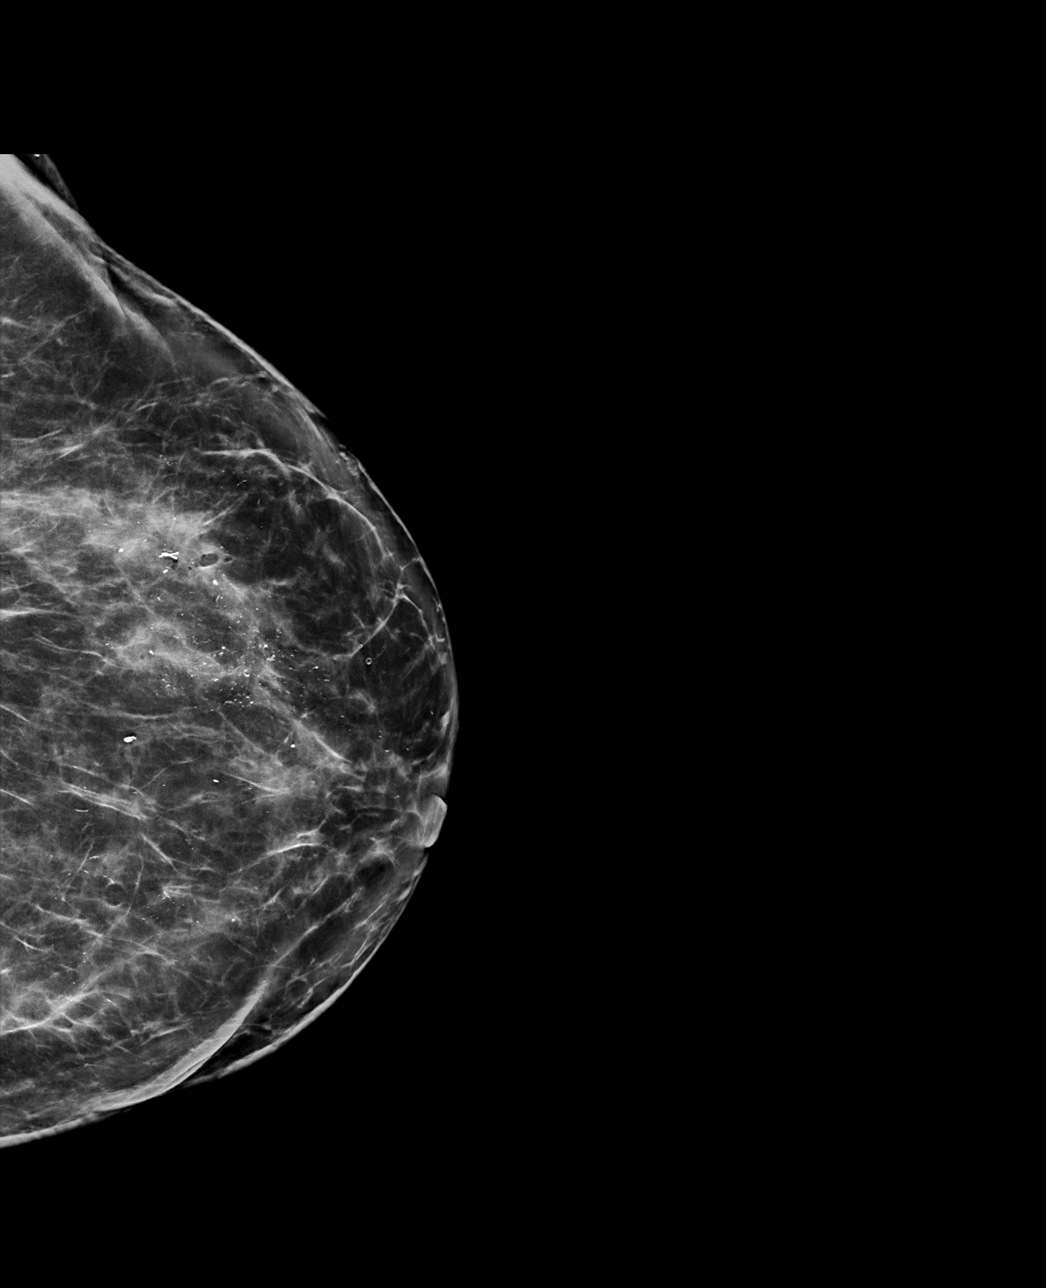

[L CC tomo · tomo slice 46/91.0]
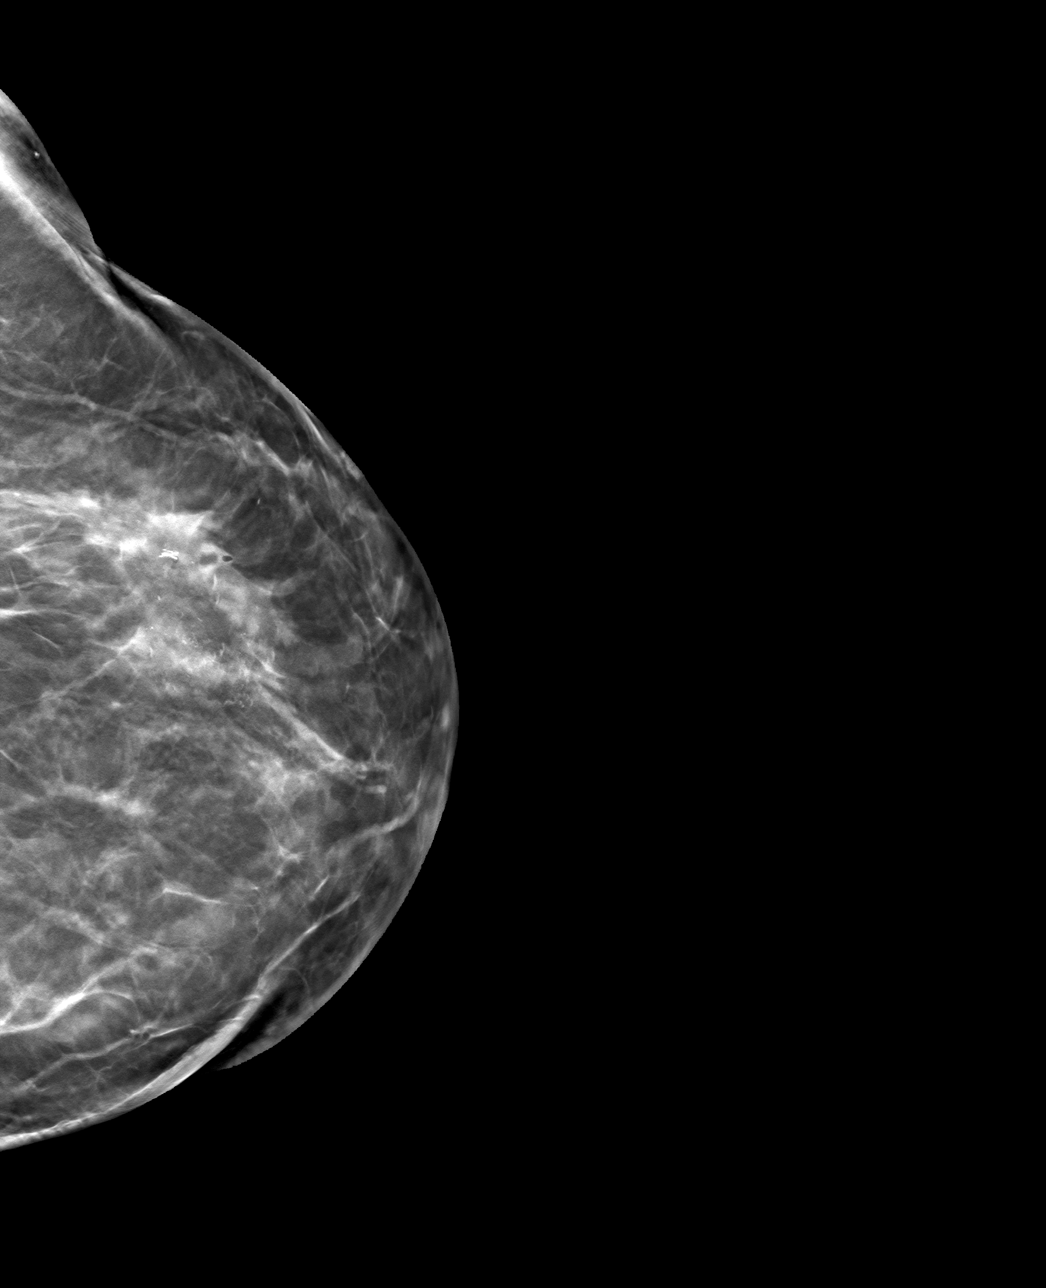

[L MLO tomo · tomo slice 54/107.0]
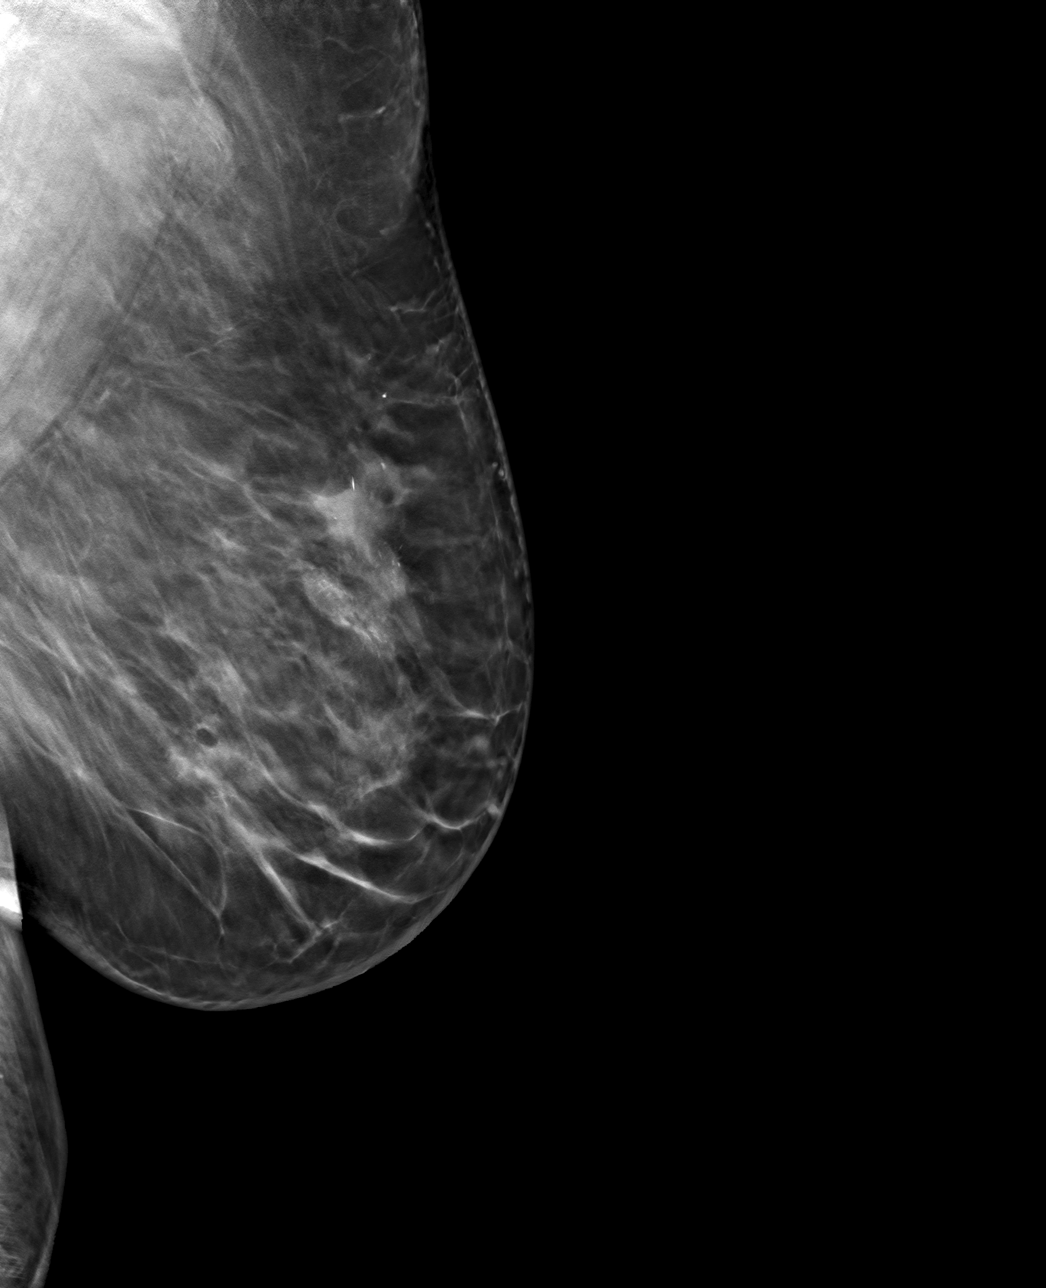

[4 of 12 positions shown; findings below may reference images not displayed]

FINDINGS: Mammographic images were obtained following stereotactic guided
biopsy of a 5.8 cm area of calcifications in the upper inner
quadrant of the left breast, ultrasound-guided core needle biopsy of
a 4.5 cm area of confluent masses in the 1 o'clock position of the
left breast and ultrasound-guided core needle biopsy of an abnormal
left axillary lymph node. The biopsy marking clips are in expected
positions at the sites of biopsy.
IMPRESSION: Appropriate positioning of the coil shaped biopsy marking clip at
the site of biopsy in the upper inner quadrant the left breast,,
ribbon shaped biopsy marker clip at the site of biopsy in the 1
o'clock position of the left breast and heart shaped biopsy marker
clip in the biopsied left axillary lymph node. The ribbon shaped
clip and coil shaped clip in the breast are located 4.4 cm apart.

Final Assessment: Post Procedure Mammograms for Marker Placement

## 2020-07-16 IMAGING — US US BREAST BX W LOC DEV 1ST LESION IMG BX SPEC US GUIDE*L*
1 series · 14 of 23 positions shown · non-contrast
Comparison: Previous exams.
COMPARISON: Previous exams.

Addendum:
CLINICAL DATA: 12 cm area calcifications highly suspicious for
malignancy in the upper outer and upper inner quadrants of the left
breast at recent mammography. The portion in the upper inner
quadrant spans 5.8 cm. There was also a 4.5 cm area of confluent
masses in the 1 o'clock position of the left breast and additional 9
mm mass in the 2 o'clock position of the left breast recent
ultrasound. Also demonstrated were 2 abnormal left axillary lymph
nodes suspicious for metastatic nodes at ultrasound.

EXAM:
LEFT BREAST STEREOTACTIC CORE NEEDLE BIOPSY
LEFT BREAST ULTRASOUND-GUIDED CORE NEEDLE BIOPSY
LEFT AXILLA ULTRASOUND-GUIDED CORE NEEDLE BIOPSY

[Series 1: us breast bx w loc dev 1st lesion img bx spec us g · 0.06mm/px · 14 of 23 slices shown]
[im 1/23]
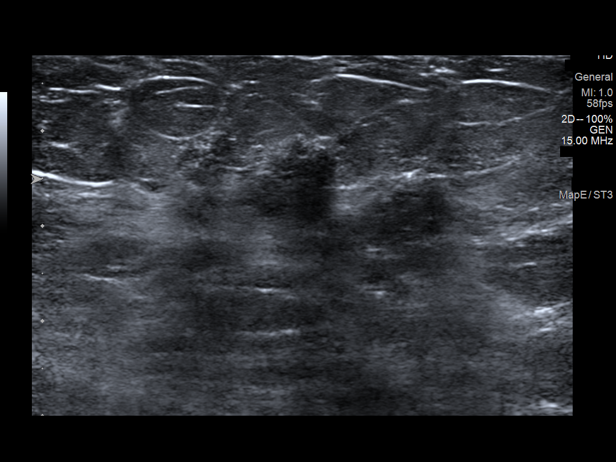
[im 3/23]
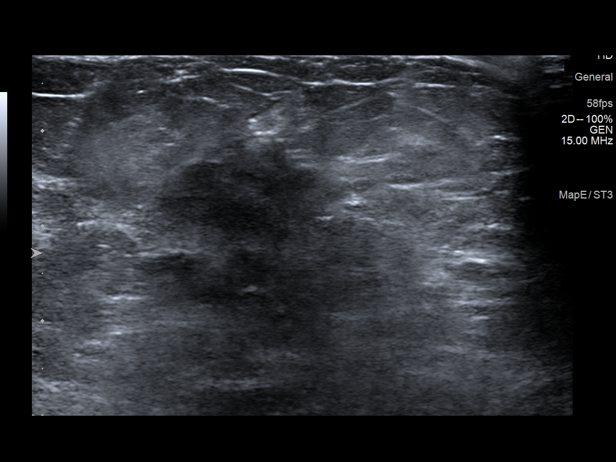
[im 5/23]
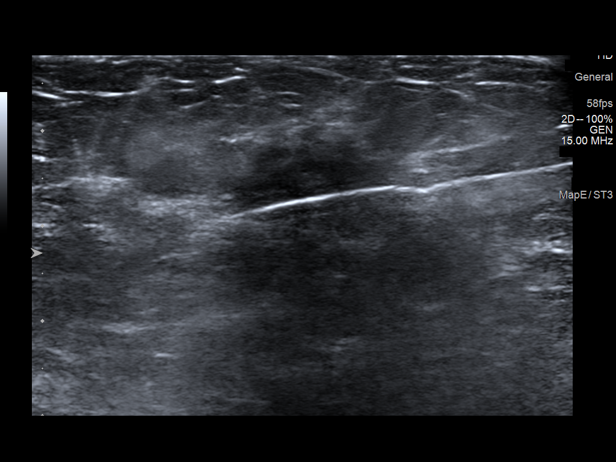
[im 6/23]
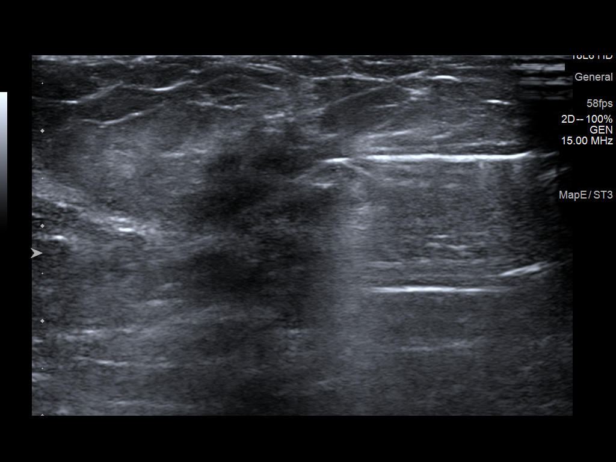
[im 8/23]
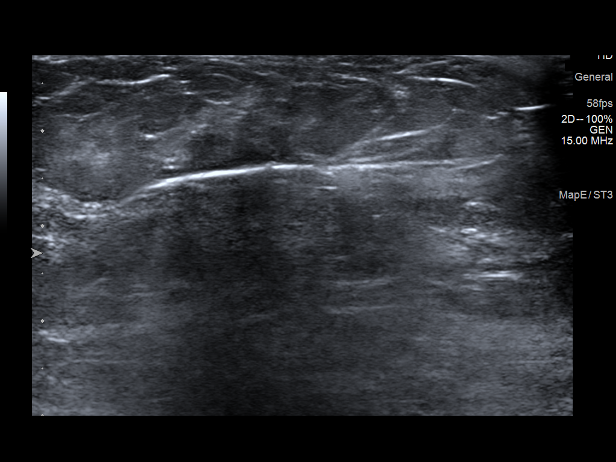
[im 10/23]
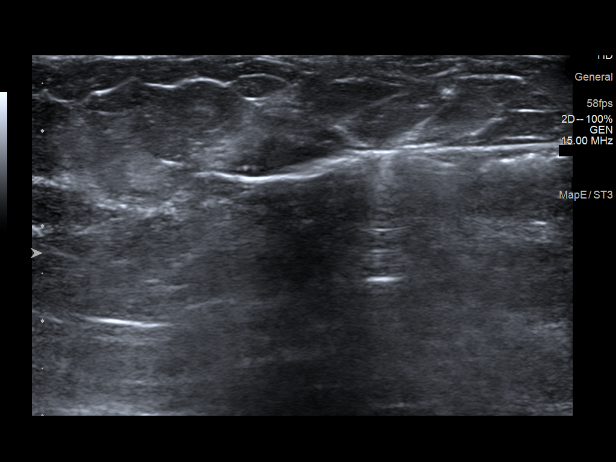
[im 11/23]
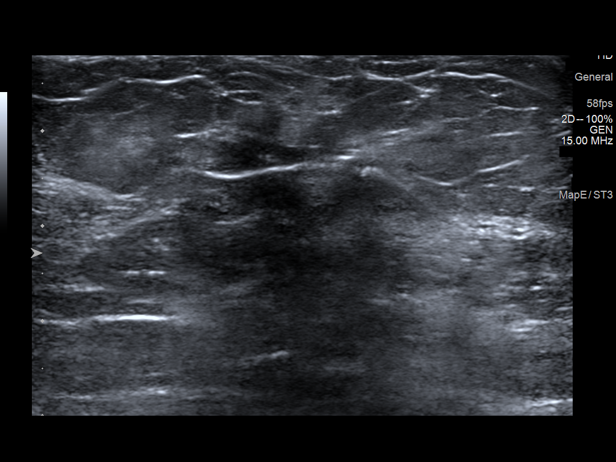
[im 13/23]
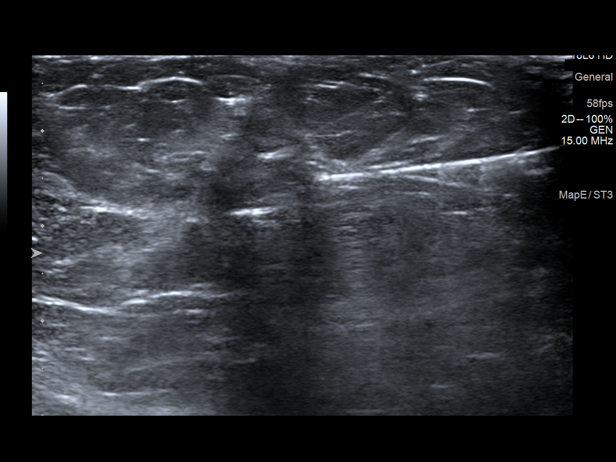
[im 14/23]
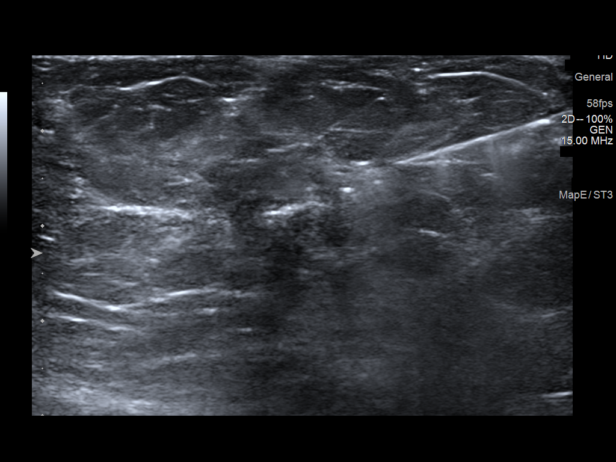
[im 16/23]
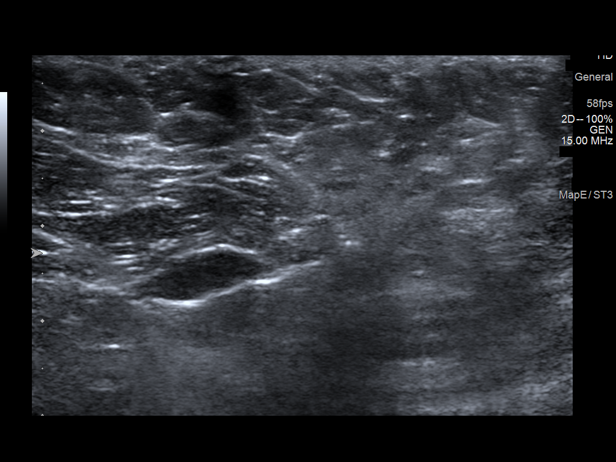
[im 18/23]
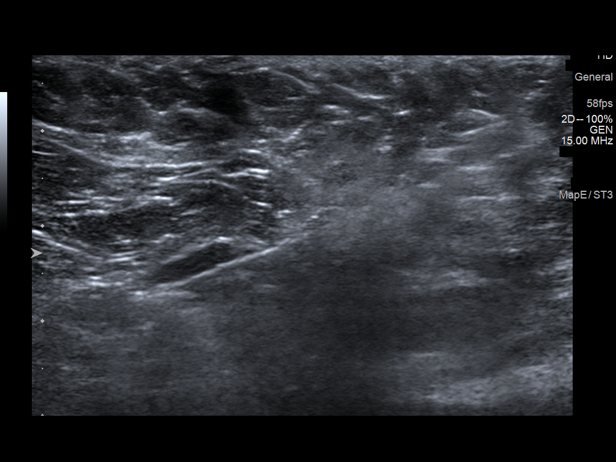
[im 19/23]
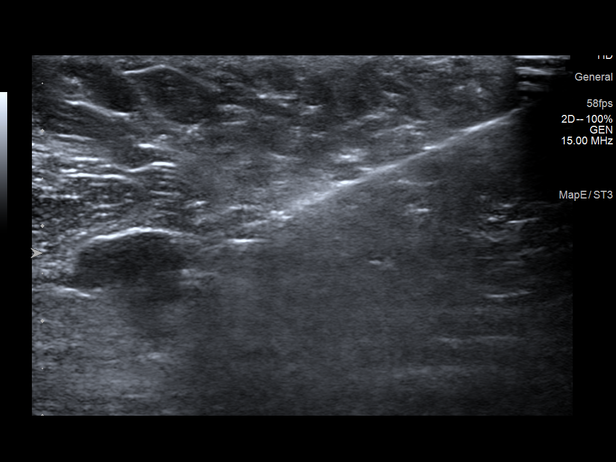
[im 21/23]
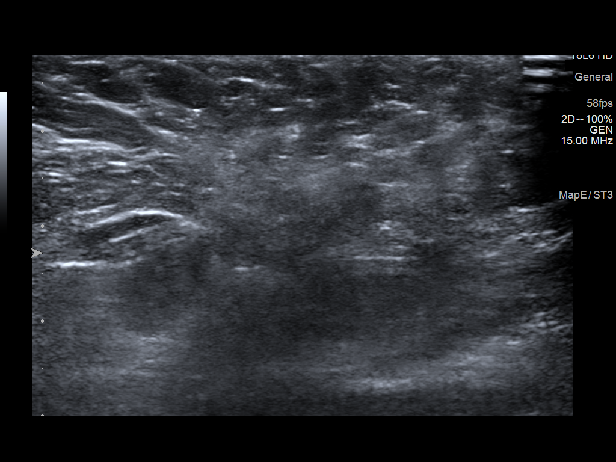
[im 23/23]
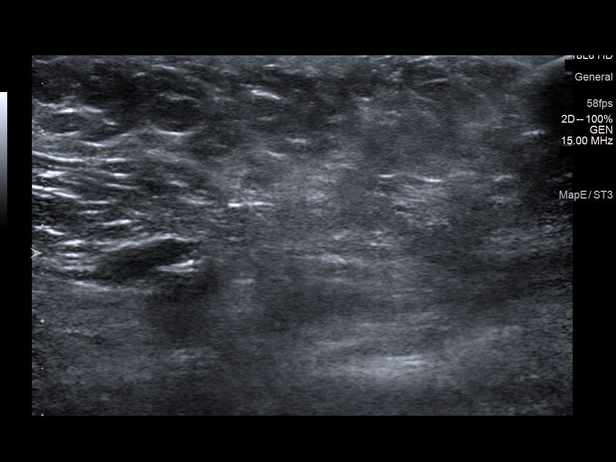

[14 of 23 positions shown; findings below may reference images not displayed]

FINDINGS: The patient and I discussed the procedures of stereotactic-guided
biopsy and ultrasound guided biopsy including benefits and
alternatives. We discussed the high likelihood of a successful
procedures. We discussed the risks of the procedures including
infection, bleeding, tissue injury, clip migration, and inadequate
sampling. Informed written consent was given. The usual time out
protocol was performed immediately prior to the procedures.

SITE 1: 5.8 CM AREA OF CALCIFICATIONS IN THE UPPER INNER QUADRANT OF
THE LEFT BREAST

Lesion quadrant: Upper inner quadrant

Using sterile technique and 1% Lidocaine as local anesthetic, under
stereotactic guidance, a 9 gauge vacuum assisted device was used to
perform core needle biopsy of a 5.8 cm area of calcifications in the
upper inner quadrant of the left breast which is part of the 12 cm
area of calcifications involving the upper outer and upper inner
quadrants of the breast, using a medial approach. Specimen
radiograph was performed showing a large number of calcifications in
multiple specimens. Specimens with calcifications are identified for
pathology.

At the conclusion of the procedure, a coil shaped tissue marker clip
was deployed into the biopsy cavity. Follow-up 2-view mammogram was
performed and dictated separately.

SITE 2: 4.5 CM AREA OF CONFLUENT MASSES IN THE 1 O'CLOCK POSITION OF
THE LEFT BREAST

Lesion quadrant: Upper outer quadrant

Using sterile technique and 1% Lidocaine as local anesthetic, under
direct ultrasound visualization, a 12 gauge PAW device was
used to perform biopsy of the recently demonstrated 4.5 cm area of
confluent masses in the 1 o'clock position of the left breast using
a caudal approach. At the conclusion of the procedure a ribbon
shaped tissue marker clip was deployed into the biopsy cavity.
Follow up 2 view mammogram was performed and dictated separately.

SITE 3: ABNORMAL LEFT AXILLARY LYMPH NODE

Using sterile technique and 1% Lidocaine as local anesthetic, under
direct ultrasound visualization, a 12 gauge PAW device was
used to perform biopsy of 1 of the recently demonstrated 2 abnormal
appearing left axillary lymph nodes using a caudal approach. At the
conclusion of the procedure a heart shaped tissue marker clip was
deployed into the biopsy cavity. Follow up 2 view mammogram was
performed and dictated separately.
IMPRESSION: Stereotactic-guided biopsy of a 5.8 cm area of calcifications in the
upper inner quadrant of the left breast, 4.5 cm area of confluent
masses in the 1 o'clock position of the left breast and an abnormal
appearing left axillary lymph node. No apparent complications.

ADDENDUM:
Pathology revealed HIGH GRADE DUCTAL CARCINOMA ARISING IN A
FIBROADENOMA WITH CALCIFICATIONS AND NECROSIS of the Left breast,
upper inner quadrant. This was found to be concordant by Dr. PAW
PAW.

Pathology revealed GRADE II INVASIVE DUCTAL CARCINOMA of the Left
breast, 1 o'clock. This was found to be concordant by Dr. PAW
PAW.

Pathology revealed METASTATIC CARCINOMA INVOLVING A LYMPH NODE of
the Left axilla. This was found to be concordant by Dr. PAW.

Pathology results were discussed with the patient and her PAW
PAW by telephone, per request. The patient reported doing well
after the biopsies with tenderness at the sites. Post biopsy
instructions and care were reviewed and questions were answered. The
patient was encouraged to call [REDACTED] for any additional concerns. My direct phone number was
provided.

The patient was referred to [REDACTED]
[REDACTED] at [REDACTED] on
[DATE].

Pathology results reported by PAW, RN on [DATE].

*** End of Addendum ***
FINDINGS: The patient and I discussed the procedures of stereotactic-guided
biopsy and ultrasound guided biopsy including benefits and
alternatives. We discussed the high likelihood of a successful
procedures. We discussed the risks of the procedures including
infection, bleeding, tissue injury, clip migration, and inadequate
sampling. Informed written consent was given. The usual time out
protocol was performed immediately prior to the procedures.

SITE 1: 5.8 CM AREA OF CALCIFICATIONS IN THE UPPER INNER QUADRANT OF
THE LEFT BREAST

Lesion quadrant: Upper inner quadrant

Using sterile technique and 1% Lidocaine as local anesthetic, under
stereotactic guidance, a 9 gauge vacuum assisted device was used to
perform core needle biopsy of a 5.8 cm area of calcifications in the
upper inner quadrant of the left breast which is part of the 12 cm
area of calcifications involving the upper outer and upper inner
quadrants of the breast, using a medial approach. Specimen
radiograph was performed showing a large number of calcifications in
multiple specimens. Specimens with calcifications are identified for
pathology.

At the conclusion of the procedure, a coil shaped tissue marker clip
was deployed into the biopsy cavity. Follow-up 2-view mammogram was
performed and dictated separately.

SITE 2: 4.5 CM AREA OF CONFLUENT MASSES IN THE 1 O'CLOCK POSITION OF
THE LEFT BREAST

Lesion quadrant: Upper outer quadrant

Using sterile technique and 1% Lidocaine as local anesthetic, under
direct ultrasound visualization, a 12 gauge PAW device was
used to perform biopsy of the recently demonstrated 4.5 cm area of
confluent masses in the 1 o'clock position of the left breast using
a caudal approach. At the conclusion of the procedure a ribbon
shaped tissue marker clip was deployed into the biopsy cavity.
Follow up 2 view mammogram was performed and dictated separately.

SITE 3: ABNORMAL LEFT AXILLARY LYMPH NODE

Using sterile technique and 1% Lidocaine as local anesthetic, under
direct ultrasound visualization, a 12 gauge PAW device was
used to perform biopsy of 1 of the recently demonstrated 2 abnormal
appearing left axillary lymph nodes using a caudal approach. At the
conclusion of the procedure a heart shaped tissue marker clip was
deployed into the biopsy cavity. Follow up 2 view mammogram was
performed and dictated separately.
IMPRESSION: Stereotactic-guided biopsy of a 5.8 cm area of calcifications in the
upper inner quadrant of the left breast, 4.5 cm area of confluent
masses in the 1 o'clock position of the left breast and an abnormal
appearing left axillary lymph node. No apparent complications.

## 2020-07-17 ENCOUNTER — Telehealth: Payer: Self-pay | Admitting: *Deleted

## 2020-07-17 ENCOUNTER — Encounter: Payer: Self-pay | Admitting: *Deleted

## 2020-07-17 NOTE — Telephone Encounter (Signed)
Confirmed [facility] for 07/22/20 at 1215 .  Instructions and contact information given.

## 2020-07-20 ENCOUNTER — Encounter: Payer: Self-pay | Admitting: *Deleted

## 2020-07-20 ENCOUNTER — Other Ambulatory Visit: Payer: Self-pay | Admitting: *Deleted

## 2020-07-20 DIAGNOSIS — C50412 Malignant neoplasm of upper-outer quadrant of left female breast: Secondary | ICD-10-CM

## 2020-07-20 DIAGNOSIS — Z170 Estrogen receptor positive status [ER+]: Secondary | ICD-10-CM | POA: Insufficient documentation

## 2020-07-22 ENCOUNTER — Inpatient Hospital Stay: Payer: Medicare HMO | Attending: Hematology | Admitting: Hematology

## 2020-07-22 ENCOUNTER — Other Ambulatory Visit: Payer: Self-pay

## 2020-07-22 ENCOUNTER — Inpatient Hospital Stay: Payer: Medicare HMO

## 2020-07-22 ENCOUNTER — Ambulatory Visit: Payer: Self-pay | Admitting: Surgery

## 2020-07-22 ENCOUNTER — Other Ambulatory Visit: Payer: Self-pay | Admitting: *Deleted

## 2020-07-22 ENCOUNTER — Encounter: Payer: Self-pay | Admitting: Physical Therapy

## 2020-07-22 ENCOUNTER — Encounter: Payer: Self-pay | Admitting: Hematology

## 2020-07-22 ENCOUNTER — Encounter: Payer: Self-pay | Admitting: *Deleted

## 2020-07-22 ENCOUNTER — Ambulatory Visit: Payer: Medicare HMO | Attending: Surgery | Admitting: Physical Therapy

## 2020-07-22 ENCOUNTER — Ambulatory Visit
Admission: RE | Admit: 2020-07-22 | Discharge: 2020-07-22 | Disposition: A | Discharge status: Home / Self Care | Payer: Medicare HMO | Source: Ambulatory Visit | Attending: Radiation Oncology | Admitting: Radiation Oncology

## 2020-07-22 VITALS — BP 165/80 | HR 73 | Temp 98.4°F | Resp 18 | Ht 63.0 in | Wt 212.8 lb

## 2020-07-22 DIAGNOSIS — E1140 Type 2 diabetes mellitus with diabetic neuropathy, unspecified: Secondary | ICD-10-CM

## 2020-07-22 DIAGNOSIS — C50412 Malignant neoplasm of upper-outer quadrant of left female breast: Secondary | ICD-10-CM

## 2020-07-22 DIAGNOSIS — C779 Secondary and unspecified malignant neoplasm of lymph node, unspecified: Secondary | ICD-10-CM | POA: Diagnosis not present

## 2020-07-22 DIAGNOSIS — M160 Bilateral primary osteoarthritis of hip: Secondary | ICD-10-CM | POA: Diagnosis not present

## 2020-07-22 DIAGNOSIS — Z170 Estrogen receptor positive status [ER+]: Secondary | ICD-10-CM | POA: Insufficient documentation

## 2020-07-22 DIAGNOSIS — I110 Hypertensive heart disease with heart failure: Secondary | ICD-10-CM | POA: Diagnosis not present

## 2020-07-22 DIAGNOSIS — R296 Repeated falls: Secondary | ICD-10-CM | POA: Insufficient documentation

## 2020-07-22 DIAGNOSIS — Z794 Long term (current) use of insulin: Secondary | ICD-10-CM | POA: Insufficient documentation

## 2020-07-22 DIAGNOSIS — Z5112 Encounter for antineoplastic immunotherapy: Secondary | ICD-10-CM | POA: Insufficient documentation

## 2020-07-22 DIAGNOSIS — M25612 Stiffness of left shoulder, not elsewhere classified: Secondary | ICD-10-CM | POA: Insufficient documentation

## 2020-07-22 DIAGNOSIS — Z5111 Encounter for antineoplastic chemotherapy: Secondary | ICD-10-CM | POA: Diagnosis present

## 2020-07-22 DIAGNOSIS — I509 Heart failure, unspecified: Secondary | ICD-10-CM

## 2020-07-22 DIAGNOSIS — Z809 Family history of malignant neoplasm, unspecified: Secondary | ICD-10-CM | POA: Diagnosis not present

## 2020-07-22 DIAGNOSIS — R7989 Other specified abnormal findings of blood chemistry: Secondary | ICD-10-CM

## 2020-07-22 DIAGNOSIS — F419 Anxiety disorder, unspecified: Secondary | ICD-10-CM | POA: Insufficient documentation

## 2020-07-22 DIAGNOSIS — Z9181 History of falling: Secondary | ICD-10-CM | POA: Insufficient documentation

## 2020-07-22 DIAGNOSIS — R262 Difficulty in walking, not elsewhere classified: Secondary | ICD-10-CM | POA: Diagnosis present

## 2020-07-22 DIAGNOSIS — Z87891 Personal history of nicotine dependence: Secondary | ICD-10-CM

## 2020-07-22 DIAGNOSIS — R293 Abnormal posture: Secondary | ICD-10-CM | POA: Insufficient documentation

## 2020-07-22 DIAGNOSIS — Z8041 Family history of malignant neoplasm of ovary: Secondary | ICD-10-CM

## 2020-07-22 DIAGNOSIS — Z808 Family history of malignant neoplasm of other organs or systems: Secondary | ICD-10-CM | POA: Diagnosis not present

## 2020-07-22 LAB — CBC WITH DIFFERENTIAL (CANCER CENTER ONLY)
Abs Immature Granulocytes: 0.01 10*3/uL (ref 0.00–0.07)
Basophils Absolute: 0 10*3/uL (ref 0.0–0.1)
Basophils Relative: 1 %
Eosinophils Absolute: 0.2 10*3/uL (ref 0.0–0.5)
Eosinophils Relative: 3 %
HCT: 40.6 % (ref 36.0–46.0)
Hemoglobin: 13.3 g/dL (ref 12.0–15.0)
Immature Granulocytes: 0 %
Lymphocytes Relative: 37 %
Lymphs Abs: 2.5 10*3/uL (ref 0.7–4.0)
MCH: 28.1 pg (ref 26.0–34.0)
MCHC: 32.8 g/dL (ref 30.0–36.0)
MCV: 85.7 fL (ref 80.0–100.0)
Monocytes Absolute: 0.4 10*3/uL (ref 0.1–1.0)
Monocytes Relative: 5 %
Neutro Abs: 3.6 10*3/uL (ref 1.7–7.7)
Neutrophils Relative %: 54 %
Platelet Count: 280 10*3/uL (ref 150–400)
RBC: 4.74 MIL/uL (ref 3.87–5.11)
RDW: 12.3 % (ref 11.5–15.5)
WBC Count: 6.7 10*3/uL (ref 4.0–10.5)
nRBC: 0 % (ref 0.0–0.2)

## 2020-07-22 LAB — CMP (CANCER CENTER ONLY)
ALT: 19 U/L (ref 0–44)
AST: 11 U/L — ABNORMAL LOW (ref 15–41)
Albumin: 3.7 g/dL (ref 3.5–5.0)
Alkaline Phosphatase: 100 U/L (ref 38–126)
Anion gap: 8 (ref 5–15)
BUN: 17 mg/dL (ref 8–23)
CO2: 29 mmol/L (ref 22–32)
Calcium: 10.7 mg/dL — ABNORMAL HIGH (ref 8.9–10.3)
Chloride: 102 mmol/L (ref 98–111)
Creatinine: 1.16 mg/dL — ABNORMAL HIGH (ref 0.44–1.00)
GFR, Estimated: 52 mL/min — ABNORMAL LOW (ref >60)
Glucose, Bld: 435 mg/dL — ABNORMAL HIGH (ref 70–99)
Potassium: 4.2 mmol/L (ref 3.5–5.1)
Sodium: 139 mmol/L (ref 135–145)
Total Bilirubin: 0.3 mg/dL (ref 0.3–1.2)
Total Protein: 7.3 g/dL (ref 6.5–8.1)

## 2020-07-22 LAB — GENETIC SCREENING ORDER

## 2020-07-22 MED ORDER — ONDANSETRON HCL 8 MG PO TABS: 8.0000 mg | ORAL_TABLET | Freq: Two times a day (BID) | ORAL | 1 refills | Status: DC | PRN

## 2020-07-22 MED ORDER — LIDOCAINE-PRILOCAINE 2.5-2.5 % EX CREA: TOPICAL_CREAM | CUTANEOUS | 3 refills | Status: DC

## 2020-07-22 MED ORDER — PROCHLORPERAZINE MALEATE 10 MG PO TABS: 10.0000 mg | ORAL_TABLET | Freq: Four times a day (QID) | ORAL | 1 refills | Status: DC | PRN

## 2020-07-22 NOTE — Progress Notes (Signed)
Radiation Oncology         [phone]) [phone] ________________________________  Name: Cheryl Blankenship        MRN: [id number]  Date of Service: 07/22/2020 DOB: 1951-07-07  CC:[name], PA-C  [name], MD     REFERRING PHYSICIAN: [name], MD   DIAGNOSIS: The encounter diagnosis was Malignant neoplasm of upper-outer quadrant of left breast in female, estrogen receptor positive ([place]).   HISTORY OF PRESENT ILLNESS: Cheryl Blankenship is a 69 y.o. female seen in the multidisciplinary breast clinic for a new diagnosis of left breast cancer. The patient was noted to have screening detected calcifications and abnormal appearing lymph nodes.  She underwent further diagnostic work-up which revealed a 12 cm span of highly suspicious calcifications in the upper outer and upper inner quadrants of the left breast, within this area by ultrasound there are confluent masses at the 1 o'clock position spanning at least 4.5 cm and an additional suspicious mass in the left breast at 2:00 was identified.  This location was 7 cm from the nipple and measured 9 mm in greatest dimension.  There were 2 left axillary lymph nodes that had cortical thickening and additional lymph node with a borderline cortex of 4 mm, totaling approximately 3 abnormal appearing nodes.  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  She is seen today to discuss treatment recommendations for her cancer.   PREVIOUS RADIATION THERAPY: No   PAST MEDICAL HISTORY:  Past Medical History:  Diagnosis Date  . Anxiety   . CHF (congestive heart failure)  ([place])   . Diabetes mellitus without complication ([place])   . Hypertension        PAST SURGICAL HISTORY: Past Surgical History:  Procedure Laterality Date  . CHOLECYSTECTOMY    . STOMACH SURGERY       FAMILY HISTORY:  Family History  Problem Relation Age of Onset  . Breast cancer Sister   . Heart attack Sister   . Diabetes Sister   . Heart attack Mother   . Heart attack Brother      SOCIAL HISTORY:  reports that she has quit smoking. She quit after 20.00 years of use. She has never used smokeless tobacco. She reports that she does not drink alcohol and does not use drugs. The patient is married and resides in [place]. She recently relocated to [place] from [place] to be closer to her family.  ALLERGIES: Lisinopril   MEDICATIONS:  Current Outpatient Medications  Medication Sig Dispense Refill  . amLODipine (NORVASC) 10 MG tablet Take 10 mg by mouth daily.    Marland Kitchen atorvastatin (LIPITOR) 40 MG tablet Take 40 mg by mouth daily.    . Cholecalciferol 25 MCG (1000 UT) capsule Take 1,000 Units by mouth daily.    . DULoxetine (CYMBALTA) 30 MG capsule Take 30 mg by mouth daily.    . empagliflozin (JARDIANCE) 25 MG TABS tablet Take 25 mg by mouth daily.    . folic acid (FOLVITE) 1 MG tablet Take 1 tablet by mouth daily.    . furosemide (LASIX) 40 MG tablet Take 40 mg by mouth daily.    Marland Kitchen  gabapentin (NEURONTIN) 400 MG capsule Take 400 mg by mouth in the morning and at bedtime.    Marland Kitchen glipiZIDE (GLUCOTROL XL) 5 MG 24 hr tablet Take 5 mg by mouth daily.    . insulin glargine (LANTUS SOLOSTAR) 100 UNIT/ML Solostar Pen Inject into the skin.    Marland Kitchen insulin regular (NOVOLIN R) 100 units/mL injection Inject into the skin.    Marland Kitchen losartan (COZAAR) 100 MG tablet Take 100 mg by mouth daily.    . metFORMIN (GLUCOPHAGE) 1000 MG tablet Take 1,000 mg by mouth in the morning and at bedtime.    . metoprolol tartrate (LOPRESSOR) 50 MG tablet Take 50 mg by mouth in the morning and at bedtime.    Marland Kitchen  omeprazole (PRILOSEC) 20 MG capsule Take 20 mg by mouth daily as needed.    . ondansetron (ZOFRAN-ODT) 4 MG disintegrating tablet Take 4 mg by mouth as needed.    . potassium chloride (KLOR-CON) 10 MEQ tablet Take 20 mEq by mouth daily. DAILY WITH LASIX    . pregabalin (LYRICA) 75 MG capsule Take 75 mg by mouth daily.    Marland Kitchen tiZANidine (ZANAFLEX) 4 MG tablet Take 4 mg by mouth daily as needed.    . traMADol (ULTRAM) 50 MG tablet Take 50 mg by mouth daily.     No current facility-administered medications for this encounter.     REVIEW OF SYSTEMS: On review of systems, the patient reports that she is doing well overall. She reports she is having soreness in her breast and lymph node area since her biopsy but that this has only been since surgery.     PHYSICAL EXAM:  Wt Readings from Last 3 Encounters:  07/15/20 218 lb (98.9 kg)   Temp Readings from Last 3 Encounters:  No data found for Temp   BP Readings from Last 3 Encounters:  07/15/20 (!) 144/90   Pulse Readings from Last 3 Encounters:  07/15/20 66    In general this is a well appearing African-American female in no acute distress. She's alert and oriented x4 and appropriate throughout the examination. Cardiopulmonary assessment is negative for acute distress and she exhibits normal effort. Bilateral breast exam is deferred.    ECOG = 0  0 - Asymptomatic (Fully active, able to carry on all predisease activities without restriction)  1 - Symptomatic but completely ambulatory (Restricted in physically strenuous activity but ambulatory and able to carry out work of a light or sedentary nature. For example, light housework, office work)  2 - Symptomatic, <50% in bed during the day (Ambulatory and capable of all self care but unable to carry out any work activities. Up and about more than 50% of waking hours)  3 - Symptomatic, >50% in bed, but not bedbound (Capable of only limited self-care, confined to bed or chair 50% or more of  waking hours)  4 - Bedbound (Completely disabled. Cannot carry on any self-care. Totally confined to bed or chair)  5 - Death   Eustace Pen MM, Creech RH, Tormey DC, et al. [phone]). "Toxicity and response criteria of the [facility]". [place] Oncol. 5 (6): 649-55    LABORATORY DATA:  Lab Results  Component Value Date   WBC 6.7 07/22/2020   HGB 13.3 07/22/2020   HCT 40.6 07/22/2020   MCV 85.7 07/22/2020   PLT 280 07/22/2020   Lab Results  Component Value Date   NA 139 07/22/2020   K 4.2 07/22/2020   CL 102 07/22/2020  CO2 29 07/22/2020   Lab Results  Component Value Date   ALT 19 07/22/2020   AST 11 (L) 07/22/2020   ALKPHOS 100 07/22/2020   BILITOT 0.3 07/22/2020      RADIOGRAPHY: MM Digital Diagnostic Unilat L  Result Date: 07/01/2020 CLINICAL DATA:  Screening recall for left breast calcifications. EXAM: DIGITAL DIAGNOSTIC LEFT MAMMOGRAM WITH CAD LEFT BREAST ULTRASOUND COMPARISON:  Previous exam(s). We have attempted to obtain outside imaging prior to the May 20, 2020 screening mammogram, however they are unavailable for comparison. ACR Breast Density Category c: The breast tissue is heterogeneously dense, which may obscure small masses. FINDINGS: There are extensive pleomorphic calcifications involving the majority of the upper-outer quadrant and much of the upper inner quadrant of the left breast. The calcifications span about 12 cm in the medial to lateral dimension. Spot compression tomosynthesis images through the upper outer quadrant of the left breast demonstrate confluent distorted tissue without a definite discrete mass. Several enlarged lymph nodes are seen in the left axilla. Mammographic images were processed with CAD. Physical exam of the upper-outer quadrant of the left breast demonstrates a firm palpable region between 12 and 2 o'clock. Ultrasound targeted to the left breast at 1 o'clock, 5 cm from the nipple demonstrates a broad area of  hypoechoic irregular tissue/confluent masses spanning at least 4.5 cm. There is a separate oval hypoechoic mass with indistinct margins at 2 o'clock, 7 cm from the nipple measuring 0.9 x 0.4 x 0.9 cm. Ultrasound of the left axilla demonstrates 2 abnormally thickened lymph nodes with cortices measuring 6-7 mm. There is an additional lymph node with a borderline cortex of 4 mm. IMPRESSION: 1. There is a 12 cm span of highly suspicious calcifications involving the upper outer and upper inner quadrants of the left breast. On ultrasound, within this area of calcifications, there are confluent masses at 1 o'clock spanning at least 4.5 cm. An additional suspicious mass is seen in the left breast at 2 o'clock. 2. There are 2 lymph nodes with thickened cortices in the left axilla, and an additional lymph node with a borderline thickened cortex. RECOMMENDATION: 1. Ultrasound-guided biopsy is recommended for an area within the confluent masses in the left breast at 1 o'clock. 2. Ultrasound-guided biopsy is recommended for 1 of the thickened left axillary lymph nodes. 3. Stereotactic biopsy is recommended for the calcifications in the upper inner quadrant of the left breast. These procedures have been scheduled for 07/16/2020 at 10:30 a.m. I have discussed the findings and recommendations with the patient. If applicable, a reminder letter will be sent to the patient regarding the next appointment. BI-RADS CATEGORY  5: Highly suggestive of malignancy. Electronically Signed   By: Ammie Ferrier M.D.   On: 07/01/2020 14:04   US BREAST LTD UNI LEFT INC AXILLA  Result Date: 07/01/2020 CLINICAL DATA:  Screening recall for left breast calcifications. EXAM: DIGITAL DIAGNOSTIC LEFT MAMMOGRAM WITH CAD LEFT BREAST ULTRASOUND COMPARISON:  Previous exam(s). We have attempted to obtain outside imaging prior to the May 20, 2020 screening mammogram, however they are unavailable for comparison. ACR Breast Density Category c: The  breast tissue is heterogeneously dense, which may obscure small masses. FINDINGS: There are extensive pleomorphic calcifications involving the majority of the upper-outer quadrant and much of the upper inner quadrant of the left breast. The calcifications span about 12 cm in the medial to lateral dimension. Spot compression tomosynthesis images through the upper outer quadrant of the left breast demonstrate confluent distorted tissue without a  definite discrete mass. Several enlarged lymph nodes are seen in the left axilla. Mammographic images were processed with CAD. Physical exam of the upper-outer quadrant of the left breast demonstrates a firm palpable region between 12 and 2 o'clock. Ultrasound targeted to the left breast at 1 o'clock, 5 cm from the nipple demonstrates a broad area of hypoechoic irregular tissue/confluent masses spanning at least 4.5 cm. There is a separate oval hypoechoic mass with indistinct margins at 2 o'clock, 7 cm from the nipple measuring 0.9 x 0.4 x 0.9 cm. Ultrasound of the left axilla demonstrates 2 abnormally thickened lymph nodes with cortices measuring 6-7 mm. There is an additional lymph node with a borderline cortex of 4 mm. IMPRESSION: 1. There is a 12 cm span of highly suspicious calcifications involving the upper outer and upper inner quadrants of the left breast. On ultrasound, within this area of calcifications, there are confluent masses at 1 o'clock spanning at least 4.5 cm. An additional suspicious mass is seen in the left breast at 2 o'clock. 2. There are 2 lymph nodes with thickened cortices in the left axilla, and an additional lymph node with a borderline thickened cortex. RECOMMENDATION: 1. Ultrasound-guided biopsy is recommended for an area within the confluent masses in the left breast at 1 o'clock. 2. Ultrasound-guided biopsy is recommended for 1 of the thickened left axillary lymph nodes. 3. Stereotactic biopsy is recommended for the calcifications in the upper  inner quadrant of the left breast. These procedures have been scheduled for 07/16/2020 at 10:30 a.m. I have discussed the findings and recommendations with the patient. If applicable, a reminder letter will be sent to the patient regarding the next appointment. BI-RADS CATEGORY  5: Highly suggestive of malignancy. Electronically Signed   By: Ammie Ferrier M.D.   On: 07/01/2020 14:04   [place] AXILLARY NODE CORE BIOPSY LEFT  Addendum Date: 07/17/2020   ADDENDUM REPORT: 07/17/2020 14:54 ADDENDUM: Pathology revealed HIGH GRADE DUCTAL CARCINOMA ARISING IN A FIBROADENOMA WITH CALCIFICATIONS AND NECROSIS of the Left breast, upper inner quadrant. This was found to be concordant by Dr. Claudie Revering. 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 This was found to be concordant by Dr. Claudie Revering. Pathology revealed METASTATIC CARCINOMA INVOLVING A LYMPH NODE of the Left axilla. This was found to be concordant by Dr. Claudie Revering. Pathology results were discussed with the patient and her son, Frederik Pear by telephone, per request. The patient reported doing well after the biopsies with tenderness at the sites. Post biopsy instructions and care were reviewed and questions were answered. The patient was encouraged to call [facility] for any additional concerns. My direct phone number was provided. The patient was referred to [facility] on July 22, 2020. Pathology results reported by Terie Purser, RN on 07/17/2020. Electronically Signed   By: Claudie Revering M.D.   On: 07/17/2020 14:54   Result Date: 07/17/2020 CLINICAL DATA:  12 cm area calcifications highly suspicious for malignancy in the upper outer and upper inner quadrants of the left breast at recent mammography. The portion in the upper inner quadrant spans 5.8 cm. There was also a 4.5 cm area of confluent masses in the 1  o'clock position of the left breast and additional 9 mm mass in the 2 o'clock position of the left breast recent ultrasound. Also demonstrated were 2 abnormal left axillary lymph nodes suspicious for metastatic nodes  at ultrasound. EXAM: LEFT BREAST STEREOTACTIC CORE NEEDLE BIOPSY LEFT BREAST ULTRASOUND-GUIDED CORE NEEDLE BIOPSY LEFT AXILLA ULTRASOUND-GUIDED CORE NEEDLE BIOPSY COMPARISON:  Previous exams. FINDINGS: The patient and I discussed the procedures of stereotactic-guided biopsy and ultrasound guided biopsy including benefits and alternatives. We discussed the high likelihood of a successful procedures. We discussed the risks of the procedures including infection, bleeding, tissue injury, clip migration, and inadequate sampling. Informed written consent was given. The usual time out protocol was performed immediately prior to the procedures. SITE 1: 5.8 CM AREA OF CALCIFICATIONS IN THE UPPER INNER QUADRANT OF THE LEFT BREAST Lesion quadrant: Upper inner quadrant Using sterile technique and 1% Lidocaine as local anesthetic, under stereotactic guidance, a 9 gauge vacuum assisted device was used to perform core needle biopsy of a 5.8 cm area of calcifications in the upper inner quadrant of the left breast which is part of the 12 cm area of calcifications involving the upper outer and upper inner quadrants of the breast, using a medial approach. Specimen radiograph was performed showing a large number of calcifications in multiple specimens. Specimens with calcifications are identified for pathology. At the conclusion of the procedure, a coil shaped tissue marker clip was deployed into the biopsy cavity. Follow-up 2-view mammogram was performed and dictated separately. SITE 2: 4.5 CM AREA OF CONFLUENT MASSES IN THE 1 O'CLOCK POSITION OF THE LEFT BREAST Lesion quadrant: Upper outer quadrant Using sterile technique and 1% Lidocaine as local anesthetic, under direct ultrasound visualization, a 12 gauge  spring-loaded device was used to perform biopsy of the recently demonstrated 4.5 cm area of confluent masses in the 1 o'clock position of the left breast using a caudal approach. At the conclusion of the procedure a ribbon shaped tissue marker clip was deployed into the biopsy cavity. Follow up 2 view mammogram was performed and dictated separately. SITE 3: ABNORMAL LEFT AXILLARY LYMPH NODE Using sterile technique and 1% Lidocaine as local anesthetic, under direct ultrasound visualization, a 12 gauge spring-loaded device was used to perform biopsy of 1 of the recently demonstrated 2 abnormal appearing left axillary lymph nodes using a caudal approach. At the conclusion of the procedure a heart shaped tissue marker clip was deployed into the biopsy cavity. Follow up 2 view mammogram was performed and dictated separately. IMPRESSION: Stereotactic-guided biopsy of a 5.8 cm area of calcifications in the upper inner quadrant of the left breast, 4.5 cm area of confluent masses in the 1 o'clock position of the left breast and an abnormal appearing left axillary lymph node. No apparent complications. Electronically Signed: By: Claudie Revering M.D. On: 07/16/2020 11:57   Intravitreal Injection, Pharmacologic Agent - OS - Left Eye  Result Date: 06/26/2020 Time Out 06/26/2020. 1:34 PM. Confirmed correct patient, procedure, site, and patient consented. Anesthesia Topical anesthesia was used. Anesthetic medications included Lidocaine 2%, Proparacaine 0.5%. Procedure A supplied needle was used. Injection: 1.25 mg Bevacizumab (AVASTIN) SOLN   NDC: [address]-9, Lot: [id number] , Expiration date: 08/06/2020   Route: Intravitreal, Site: Left Eye, Waste: 0 mL Post-op Post injection exam found visual acuity of at least counting fingers. The patient tolerated the procedure well. There were no complications. The patient received written and verbal post procedure care education. Post injection medications were not given.   OCT, Retina  - OU - Both Eyes  Result Date: 06/26/2020 Right Eye Quality was good. Central Foveal Thickness: 229. Progression has improved. Findings include normal foveal contour, intraretinal fluid, intraretinal hyper-reflective material, no SRF (Mild interval improvement in Focal edema  temporal macula). Left Eye Quality was good. Central Foveal Thickness: 209. Progression has been stable. Findings include normal foveal contour, intraretinal fluid, no SRF (Scattered cystic changes greatest superiorly). 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 CME - cystoid macular edema. PED - pigment epithelial detachment. IRF - intraretinal fluid. SRF - subretinal fluid. EZ - ellipsoid zone. ERM - epiretinal membrane. ORA - outer retinal atrophy. ORT - outer retinal tubulation. SRHM - subretinal hyper-reflective material. IRHM - intraretinal hyper-reflective material   MM CLIP PLACEMENT LEFT  Result Date: 07/16/2020 CLINICAL DATA:  Status post stereotactic guided core needle biopsy of a 5.8 cm area of calcifications in the upper inner quadrant of the left breast, ultrasound-guided core needle biopsy of a 4.5 cm area of confluent masses in the 1 o'clock position of the left breast ultrasound-guided core needle biopsy of an abnormal appearing left axillary lymph node. EXAM: DIAGNOSTIC LEFT MAMMOGRAM POST STEREOTACTIC AND ULTRASOUND-GUIDED CORE NEEDLE BIOPSIES COMPARISON:  Previous exam(s). FINDINGS: Mammographic images were obtained following stereotactic guided biopsy of a 5.8 cm area of calcifications in the upper inner quadrant of the left breast, ultrasound-guided core needle biopsy of a 4.5 cm area of confluent masses in the 1 o'clock position of the left breast and ultrasound-guided core needle  biopsy of an abnormal left axillary lymph node. The biopsy marking clips are in expected positions at the sites of biopsy. IMPRESSION: Appropriate positioning of the coil shaped biopsy marking clip at the site of biopsy in the upper inner quadrant the left breast,, ribbon shaped biopsy marker clip at the site of biopsy in the 1 o'clock position of the left breast and heart shaped biopsy marker clip in the biopsied left axillary lymph node. The ribbon shaped clip and coil shaped clip in the breast are located 4.4 cm apart. Final Assessment: Post Procedure Mammograms for Marker Placement Electronically Signed   By: Claudie Revering M.D.   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 This was found to be concordant by Dr. Claudie Revering. 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 This was found to be concordant by Dr. Claudie Revering. Pathology revealed METASTATIC CARCINOMA INVOLVING A LYMPH NODE of the Left axilla. This was found to be concordant by Dr. Claudie Revering. Pathology results were discussed with the patient and her son, Frederik Pear by telephone, per request. The patient reported doing well after the biopsies with tenderness at the sites. Post biopsy instructions and care were reviewed and questions were answered. The patient was encouraged to call [facility] for any additional concerns. My direct phone number was provided. The patient was referred to [facility] on July 22, 2020. Pathology results reported by Terie Purser, RN on 07/17/2020. Electronically Signed   By: Claudie Revering M.D.   On: 07/17/2020 14:54    Result Date: 07/17/2020 CLINICAL DATA:  12 cm area calcifications highly suspicious for malignancy in the upper outer and upper inner quadrants of the left  breast at recent mammography. The portion in the upper inner quadrant spans 5.8 cm. There was also a 4.5 cm area of confluent masses in the 1 o'clock position of the left breast and additional 9 mm mass in the 2 o'clock position of the left breast recent ultrasound. Also demonstrated were 2 abnormal left axillary lymph nodes suspicious for metastatic nodes at ultrasound. EXAM: LEFT BREAST STEREOTACTIC CORE NEEDLE BIOPSY LEFT BREAST ULTRASOUND-GUIDED CORE NEEDLE BIOPSY LEFT AXILLA ULTRASOUND-GUIDED CORE NEEDLE BIOPSY COMPARISON:  Previous exams. FINDINGS: The patient and I discussed the procedures of stereotactic-guided biopsy and ultrasound guided biopsy including benefits and alternatives. We discussed the high likelihood of a successful procedures. We discussed the risks of the procedures including infection, bleeding, tissue injury, clip migration, and inadequate sampling. Informed written consent was given. The usual time out protocol was performed immediately prior to the procedures. SITE 1: 5.8 CM AREA OF CALCIFICATIONS IN THE UPPER INNER QUADRANT OF THE LEFT BREAST Lesion quadrant: Upper inner quadrant Using sterile technique and 1% Lidocaine as local anesthetic, under stereotactic guidance, a 9 gauge vacuum assisted device was used to perform core needle biopsy of a 5.8 cm area of calcifications in the upper inner quadrant of the left breast which is part of the 12 cm area of calcifications involving the upper outer and upper inner quadrants of the breast, using a medial approach. Specimen radiograph was performed showing a large number of calcifications in multiple specimens. Specimens with calcifications are identified for pathology. At the conclusion of the procedure, a coil shaped tissue marker clip was deployed into the biopsy cavity. Follow-up  2-view mammogram was performed and dictated separately. SITE 2: 4.5 CM AREA OF CONFLUENT MASSES IN THE 1 O'CLOCK POSITION OF THE LEFT BREAST Lesion quadrant: Upper outer quadrant Using sterile technique and 1% Lidocaine as local anesthetic, under direct ultrasound visualization, a 12 gauge spring-loaded device was used to perform biopsy of the recently demonstrated 4.5 cm area of confluent masses in the 1 o'clock position of the left breast using a caudal approach. At the conclusion of the procedure a ribbon shaped tissue marker clip was deployed into the biopsy cavity. Follow up 2 view mammogram was performed and dictated separately. SITE 3: ABNORMAL LEFT AXILLARY LYMPH NODE Using sterile technique and 1% Lidocaine as local anesthetic, under direct ultrasound visualization, a 12 gauge spring-loaded device was used to perform biopsy of 1 of the recently demonstrated 2 abnormal appearing left axillary lymph nodes using a caudal approach. At the conclusion of the procedure a heart shaped tissue marker clip was deployed into the biopsy cavity. Follow up 2 view mammogram was performed and dictated separately. IMPRESSION: Stereotactic-guided biopsy of a 5.8 cm area of calcifications in the upper inner quadrant of the left breast, 4.5 cm area of confluent masses in the 1 o'clock position of the left breast and an abnormal appearing left axillary lymph node. No apparent complications. Electronically Signed: By: Claudie Revering M.D. 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 This was found to be concordant by Dr. Claudie Revering. 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 This was found to be concordant by Dr. Claudie Revering.  Pathology revealed METASTATIC CARCINOMA INVOLVING A LYMPH NODE of  the Left axilla. This was found to be concordant by Dr. Claudie Revering. Pathology results were discussed with the patient and her son, Frederik Pear by telephone, per request. The patient reported doing well after the biopsies with tenderness at the sites. Post biopsy instructions and care were reviewed and questions were answered. The patient was encouraged to call [facility] for any additional concerns. My direct phone number was provided. The patient was referred to [facility] on July 22, 2020. Pathology results reported by Terie Purser, RN on 07/17/2020. Electronically Signed   By: Claudie Revering M.D.   On: 07/17/2020 14:54   Result Date: 07/17/2020 CLINICAL DATA:  12 cm area calcifications highly suspicious for malignancy in the upper outer and upper inner quadrants of the left breast at recent mammography. The portion in the upper inner quadrant spans 5.8 cm. There was also a 4.5 cm area of confluent masses in the 1 o'clock position of the left breast and additional 9 mm mass in the 2 o'clock position of the left breast recent ultrasound. Also demonstrated were 2 abnormal left axillary lymph nodes suspicious for metastatic nodes at ultrasound. EXAM: LEFT BREAST STEREOTACTIC CORE NEEDLE BIOPSY LEFT BREAST ULTRASOUND-GUIDED CORE NEEDLE BIOPSY LEFT AXILLA ULTRASOUND-GUIDED CORE NEEDLE BIOPSY COMPARISON:  Previous exams. FINDINGS: The patient and I discussed the procedures of stereotactic-guided biopsy and ultrasound guided biopsy including benefits and alternatives. We discussed the high likelihood of a successful procedures. We discussed the risks of the procedures including infection, bleeding, tissue injury, clip migration, and inadequate sampling. Informed written consent was given. The usual time out protocol was performed immediately prior  to the procedures. SITE 1: 5.8 CM AREA OF CALCIFICATIONS IN THE UPPER INNER QUADRANT OF THE LEFT BREAST Lesion quadrant: Upper inner quadrant Using sterile technique and 1% Lidocaine as local anesthetic, under stereotactic guidance, a 9 gauge vacuum assisted device was used to perform core needle biopsy of a 5.8 cm area of calcifications in the upper inner quadrant of the left breast which is part of the 12 cm area of calcifications involving the upper outer and upper inner quadrants of the breast, using a medial approach. Specimen radiograph was performed showing a large number of calcifications in multiple specimens. Specimens with calcifications are identified for pathology. At the conclusion of the procedure, a coil shaped tissue marker clip was deployed into the biopsy cavity. Follow-up 2-view mammogram was performed and dictated separately. SITE 2: 4.5 CM AREA OF CONFLUENT MASSES IN THE 1 O'CLOCK POSITION OF THE LEFT BREAST Lesion quadrant: Upper outer quadrant Using sterile technique and 1% Lidocaine as local anesthetic, under direct ultrasound visualization, a 12 gauge spring-loaded device was used to perform biopsy of the recently demonstrated 4.5 cm area of confluent masses in the 1 o'clock position of the left breast using a caudal approach. At the conclusion of the procedure a ribbon shaped tissue marker clip was deployed into the biopsy cavity. Follow up 2 view mammogram was performed and dictated separately. SITE 3: ABNORMAL LEFT AXILLARY LYMPH NODE Using sterile technique and 1% Lidocaine as local anesthetic, under direct ultrasound visualization, a 12 gauge spring-loaded device was used to perform biopsy of 1 of the recently demonstrated 2 abnormal appearing left axillary lymph nodes using a caudal approach. At the conclusion of the procedure a heart shaped tissue marker clip was deployed into the biopsy cavity. Follow up 2 view mammogram was performed and dictated separately.  IMPRESSION:  Stereotactic-guided biopsy of a 5.8 cm area of calcifications in the upper inner quadrant of the left breast, 4.5 cm area of confluent masses in the 1 o'clock position of the left breast and an abnormal appearing left axillary lymph node. No apparent complications. Electronically Signed: By: Claudie Revering M.D. On: 07/16/2020 11:57       IMPRESSION/PLAN: 1. 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 Dr. Lisbeth Renshaw discusses the pathology findings and reviews the nature of node positive breast disease. The consensus from the breast conference includes proceeding with neoadjuvant chemotherapy, her surgery is to be determined, and she would benefit from adjuvant radiotherapy to the breast or chest wall depending on her surgical decision making as well as to her regional lymph nodes of involvement. She would also be offered long-term antiestrogen therapy to also reduce the risks of systemic recurrences. We discussed the risks, benefits, short, and long term effects of radiotherapy, and the patient is interested in proceeding. Dr. Lisbeth Renshaw discusses the delivery and logistics of radiotherapy and anticipates a course of 6 1/2 weeks of radiotherapy. We will see her back a few weeks after surgery to discuss the simulation process and anticipate we starting radiotherapy about 4-6 weeks after surgery.    In a visit lasting 60 minutes, greater than 50% of the time was spent face to face reviewing her case, as well as in preparation of, discussing, and coordinating the patient's care.  The above documentation reflects my direct findings during this shared patient visit. Please see the separate note by Dr. Lisbeth Renshaw on this date for the remainder of the patient's plan of care.    Carola Rhine, PAC

## 2020-07-22 NOTE — Patient Instructions (Signed)

## 2020-07-22 NOTE — Progress Notes (Signed)
[place]   [phone] Fax:([phone] [place] Note   Patient Care Team: [name], PA-C as PCP - General (Internal Medicine) [name], RN as Oncology Nurse Navigator [name], RN as Oncology Nurse Navigator [name], MD as Consulting Physician (General Surgery) [name], MD as Consulting Physician (Hematology) [name], MD as Consulting Physician (Radiation Oncology)  Date of Service:  07/22/2020   CHIEF COMPLAINTS/PURPOSE OF CONSULTATION:  Malignant neoplasm of upper-outer quadrant of left breast   Oncology History Overview Note  Cancer Staging Malignant neoplasm of upper-outer quadrant of left breast in female, estrogen receptor positive ([place]) Staging form: Breast, AJCC 8th Edition - Clinical stage from 07/16/2020: Stage IB (cT2, cN1, cM0, G2, ER+, PR+, HER2+) - Signed by [name], MD on 07/21/2020    Malignant neoplasm of upper-outer quadrant of left breast in female, estrogen receptor positive ([place])  07/01/2020 Mammogram   IMPRESSION: 1. There is a 12 cm span of highly suspicious calcifications involving the upper outer and upper inner quadrants of the left breast. On ultrasound, within this area of calcifications, there are confluent masses at 1 o'clock spanning at least 4.5 cm. An additional suspicious mass is seen in the left breast at 2 o'clock.  -Ultrasound targeted to the left breast at 1 o'clock, 5 cm from the nipple demonstrates a broad area of hypoechoic irregular tissue/confluent masses spanning at least 4.5 cm. There is a separate oval hypoechoic mass with indistinct margins at 2 o'clock, 7 cm from the nipple measuring 0.9 x 0.4 x 0.9 cm. Ultrasound of the left axilla demonstrates 2 abnormally thickened lymph nodes with cortices measuring 6-7 mm. There is an additional lymph node with a borderline cortex of 4 mm.   2. There are 2 lymph nodes with thickened cortices in the  left axilla, and an additional lymph node with a borderline thickened cortex.   07/16/2020 Cancer Staging   Staging form: Breast, AJCC 8th Edition - Clinical stage from 07/16/2020: Stage IB (cT2, cN1, cM0, G2, ER+, PR+, HER2+) - Signed by [name], MD on 07/21/2020   07/16/2020 Initial Biopsy   Diagnosis 1. 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 Breast, left, needle core biopsy, 1 o'clock - INVASIVE DUCTAL CARCINOMA - SEE COMMENT 3. Lymph node, biopsy - METASTATIC CARCINOMA INVOLVING A LYMPH NODE - SEE COMMENT Microscopic Comment 1. Based on the biopsy, the ductal carcinoma in situ has a comedo pattern, high nuclear grade and measures 0.7 cm in greatest linear extent. Prognostic markers (ER/PR) are pending and will be reported in an addendum. 2. Based on the biopsy, the carcinoma appears Nottingham grade 2 of 3 and measures 1.5 cm in greatest linear extent. Prognostic markers (ER/PR/ki-67/HER2) are pending and will be reported in an addendum. Dr. Saralyn Pilar reviewed the case and agrees with the above diagnosis. These results were called to [facility] on July 17, 2020. 3. Based on the biopsy, the carcinoma appears measures 1.0 cm in greatest linear extent. Prognostic markers (ER/PR/ki-67/HER2) are pending and will be reported in an addendum.   07/16/2020 Receptors her2   1. PROGNOSTIC INDICATORS Results: IMMUNOHISTOCHEMICAL AND MORPHOMETRIC ANALYSIS PERFORMED MANUALLY Estrogen Receptor: 30%, POSITIVE, STRONG STAINING INTENSITY Progesterone Receptor: 5%, POSITIVE, STRONG STAINING INTENSITY  2. PROGNOSTIC INDICATORS Results: IMMUNOHISTOCHEMICAL AND MORPHOMETRIC ANALYSIS PERFORMED MANUALLY The tumor cells are POSITIVE for Her2 (3+). Estrogen Receptor:  85%, POSITIVE, STRONG STAINING INTENSITY Progesterone Receptor: 60%, POSITIVE, STRONG STAINING  INTENSITY Proliferation Marker Ki67: 25%  3. PROGNOSTIC INDICATORS Results: IMMUNOHISTOCHEMICAL AND MORPHOMETRIC ANALYSIS PERFORMED MANUALLY The tumor cells are POSITIVE for Her2 (3+). Estrogen Receptor: 95%, POSITIVE, STRONG STAINING INTENSITY Progesterone Receptor: 45%, POSITIVE, STRONG STAINING INTENSITY Proliferation Marker Ki67: 30%   07/20/2020 Initial Diagnosis   Malignant neoplasm of upper-outer quadrant of left breast in female, estrogen receptor positive ([place])      HISTORY OF PRESENTING ILLNESS:  Cheryl Blankenship 69 y.o. female is a here because of newly diagnosed left breast cancer. The patient presents to the clinic today accompanied by her daughter and son.   She denies feeling the mass herself. She notes she did feel discomfort in her neck which she notified her doctor. Her doctor recommended Mammogram. Her last was in early 2020 in [place]. Her mammogram showed she has left breast masses. Biopsy showed breast cancer involving her LN. She denies any nipple, skin or breast changes. She no longer has her neck discomfort. She also notes lower left breast pain and left arm after her biopsy.  She notes her appetite has changed from nausea about 3 months ago after changing insulin medication. She has been using sublingual Zofran. This is managed by her PCP office. She doe snot recent headaches since her change in medication as well.   Socially she moved to [place] from [place] in 2021. She lives with her daughter now in [place] and her son lives in [place]. She smoked 3-4 ppd for years before quitting. She quit her heavy drinking at the same time.  She has a PMHx with hip pain from arthritis. She also notes having Neuropathy for years. She uses Gabapentin. She also has Anxiety, CHF, DM, HTN. She had fall in early 2021 and again in recent months which her family notes was related to multiple medications and poor sleep. She has never had a stroke. She is on medication for her  memory, but family does not note true concern of memory loss or confusion.  She has appendectomy, cholecystectomy and gangrenous green surgery at umbilicus.  Her mother had ovarian cancer at 82. Her niece had bone cancer and 2 maternal first cousins with unknown cancer.    GYN HISTORY  Menarchal: 13 LMP: 7 years ago, 2014  Contraceptive: No HRT: No G2P2: first at age 34    REVIEW OF SYSTEMS:    Constitutional: Denies fevers, chills or abnormal night sweats (+) intermittent headaches  Eyes: Denies blurriness of vision, double vision or watery eyes Ears, nose, mouth, throat, and face: Denies mucositis or sore throat Respiratory: Denies cough, dyspnea or wheezes Cardiovascular: Denies palpitation, chest discomfort or lower extremity swelling Gastrointestinal:  Denies heartburn or change in bowel habits (+) Nausea  Skin: Denies abnormal skin rashes Lymphatics: Denies new lymphadenopathy or easy bruising Neurological:Denies numbness, tingling or new weaknesses Behavioral/Psych: Mood is stable, no new changes  All other systems were reviewed with the patient and are negative.  MEDICAL HISTORY:  Past Medical History:  Diagnosis Date  . Anxiety   . Breast cancer ([place])   . CHF (congestive heart failure) ([place])   . Diabetes mellitus without complication ([place])   . Hypertension     SURGICAL HISTORY: Past Surgical History:  Procedure Laterality Date  . APPENDECTOMY    . CHOLECYSTECTOMY    . STOMACH SURGERY      SOCIAL HISTORY: Social History   Socioeconomic History  . Marital status: Married    Spouse  name: Not on file  . Number of children: 2  . Years of education: Not on file  . Highest education level: Not on file  Occupational History  . Occupation: retired Human resources officer  Tobacco Use  . Smoking status: Former Smoker    Packs/day: 3.00    Years: 30.00    Pack years: 90.00  . Smokeless tobacco: Never Used  Substance and Sexual Activity  . Alcohol use: Never    Comment:  used to drink alcohol heavily   . Drug use: Never  . Sexual activity: Not on file  Other Topics Concern  . Not on file  Social History Narrative  . Not on file   Social Determinants of Health   Financial Resource Strain:   . Difficulty of Paying Living Expenses: Not on file  Food Insecurity:   . Worried About Charity fundraiser in the Last Year: Not on file  . Ran Out of Food in the Last Year: Not on file  Transportation Needs:   . Lack of Transportation (Medical): Not on file  . Lack of Transportation (Non-Medical): Not on file  Physical Activity:   . Days of Exercise per Week: Not on file  . Minutes of Exercise per Session: Not on file  Stress:   . Feeling of Stress : Not on file  Social Connections:   . Frequency of Communication with Friends and Family: Not on file  . Frequency of Social Gatherings with Friends and Family: Not on file  . Attends Religious Services: Not on file  . Active Member of Clubs or Organizations: Not on file  . Attends Archivist Meetings: Not on file  . Marital Status: Not on file  Intimate Partner Violence:   . Fear of Current or Ex-Partner: Not on file  . Emotionally Abused: Not on file  . Physically Abused: Not on file  . Sexually Abused: Not on file    FAMILY HISTORY: Family History  Problem Relation Age of Onset  . Heart attack Sister   . Diabetes Sister   . Heart attack Mother   . Ovarian cancer Mother 25  . Heart attack Brother   . Cancer Niece        bone cancer   . Cancer Cousin        unknown type cancer   . Cancer Cousin        metastatic cancer to bone     ALLERGIES:  is allergic to lisinopril.  MEDICATIONS:  Current Outpatient Medications  Medication Sig Dispense Refill  . amLODipine (NORVASC) 10 MG tablet Take 10 mg by mouth daily.    Marland Kitchen atorvastatin (LIPITOR) 40 MG tablet Take 40 mg by mouth daily.    . Cholecalciferol 25 MCG (1000 UT) capsule Take 1,000 Units by mouth daily.    . DULoxetine (CYMBALTA)  30 MG capsule Take 30 mg by mouth daily.    . empagliflozin (JARDIANCE) 25 MG TABS tablet Take 25 mg by mouth daily.    . folic acid (FOLVITE) 1 MG tablet Take 1 tablet by mouth daily.    . furosemide (LASIX) 40 MG tablet Take 40 mg by mouth daily.    Marland Kitchen gabapentin (NEURONTIN) 400 MG capsule Take 400 mg by mouth in the morning and at bedtime.    Marland Kitchen glipiZIDE (GLUCOTROL XL) 5 MG 24 hr tablet Take 5 mg by mouth daily.    . insulin glargine (LANTUS SOLOSTAR) 100 UNIT/ML Solostar Pen Inject into the skin.    Marland Kitchen  insulin regular (NOVOLIN R) 100 units/mL injection Inject into the skin.    Marland Kitchen losartan (COZAAR) 100 MG tablet Take 100 mg by mouth daily.    . metFORMIN (GLUCOPHAGE) 1000 MG tablet Take 1,000 mg by mouth in the morning and at bedtime.    . metoprolol tartrate (LOPRESSOR) 50 MG tablet Take 50 mg by mouth in the morning and at bedtime.    Marland Kitchen omeprazole (PRILOSEC) 20 MG capsule Take 20 mg by mouth daily as needed.    . ondansetron (ZOFRAN-ODT) 4 MG disintegrating tablet Take 4 mg by mouth as needed.    . potassium chloride (KLOR-CON) 10 MEQ tablet Take 20 mEq by mouth daily. DAILY WITH LASIX    . pregabalin (LYRICA) 75 MG capsule Take 75 mg by mouth daily.    Marland Kitchen tiZANidine (ZANAFLEX) 4 MG tablet Take 4 mg by mouth daily as needed.    . traMADol (ULTRAM) 50 MG tablet Take 50 mg by mouth daily.     No current facility-administered medications for this visit.    PHYSICAL EXAMINATION: ECOG PERFORMANCE STATUS: 1 - Symptomatic but completely ambulatory  Vitals:   07/22/20 1300  BP: (!) 165/80  Pulse: 73  Resp: 18  Temp: 98.4 F (36.9 C)  SpO2: 97%   Filed Weights   07/22/20 1300  Weight: 212 lb 12.8 oz (96.5 kg)    GENERAL:alert, no distress and comfortable SKIN: skin color, texture, turgor are normal, no rashes or significant lesions EYES: normal, Conjunctiva are pink and non-injected, sclera clear  NECK: supple, thyroid normal size, non-tender, without nodularity LYMPH:  no palpable  lymphadenopathy in the cervical, axillary  LUNGS: clear to auscultation and percussion with normal breathing effort HEART: regular rate & rhythm and no murmurs and no lower extremity edema ABDOMEN:abdomen soft, non-tender and normal bowel sounds (+) Surgical incision healed well  Musculoskeletal:no cyanosis of digits and no clubbing (+) Tenderness of left shoulder  NEURO: alert & oriented x 3 with fluent speech, no focal motor/sensory deficits BREAST: (+) Palpable 7x4cm mass in Inner-outer Upper breast from 10-1:00 position   LABORATORY DATA:  I have reviewed the data as listed CBC Latest Ref Rng & Units 07/22/2020  WBC 4.0 - 10.5 K/uL 6.7  Hemoglobin 12.0 - 15.0 g/dL 13.3  Hematocrit 36 - 46 % 40.6  Platelets 150 - 400 K/uL 280    CMP Latest Ref Rng & Units 07/22/2020  Glucose 70 - 99 mg/dL 435(H)  BUN 8 - 23 mg/dL 17  Creatinine 0.44 - 1.00 mg/dL 1.16(H)  Sodium 135 - 145 mmol/L 139  Potassium 3.5 - 5.1 mmol/L 4.2  Chloride 98 - 111 mmol/L 102  CO2 22 - 32 mmol/L 29  Calcium 8.9 - 10.3 mg/dL 10.7(H)  Total Protein 6.5 - 8.1 g/dL 7.3  Total Bilirubin 0.3 - 1.2 mg/dL 0.3  Alkaline Phos 38 - 126 U/L 100  AST 15 - 41 U/L 11(L)  ALT 0 - 44 U/L 19     RADIOGRAPHIC STUDIES: I have personally reviewed the radiological images as listed and agreed with the findings in the report. MM Digital Diagnostic Unilat L  Result Date: 07/01/2020 CLINICAL DATA:  Screening recall for left breast calcifications. EXAM: DIGITAL DIAGNOSTIC LEFT MAMMOGRAM WITH CAD LEFT BREAST ULTRASOUND COMPARISON:  Previous exam(s). We have attempted to obtain outside imaging prior to the May 20, 2020 screening mammogram, however they are unavailable for comparison. ACR Breast Density Category c: The breast tissue is heterogeneously dense, which may obscure small masses. FINDINGS:  There are extensive pleomorphic calcifications involving the majority of the upper-outer quadrant and much of the upper inner quadrant of  the left breast. The calcifications span about 12 cm in the medial to lateral dimension. Spot compression tomosynthesis images through the upper outer quadrant of the left breast demonstrate confluent distorted tissue without a definite discrete mass. Several enlarged lymph nodes are seen in the left axilla. Mammographic images were processed with CAD. Physical exam of the upper-outer quadrant of the left breast demonstrates a firm palpable region between 12 and 2 o'clock. Ultrasound targeted to the left breast at 1 o'clock, 5 cm from the nipple demonstrates a broad area of hypoechoic irregular tissue/confluent masses spanning at least 4.5 cm. There is a separate oval hypoechoic mass with indistinct margins at 2 o'clock, 7 cm from the nipple measuring 0.9 x 0.4 x 0.9 cm. Ultrasound of the left axilla demonstrates 2 abnormally thickened lymph nodes with cortices measuring 6-7 mm. There is an additional lymph node with a borderline cortex of 4 mm. IMPRESSION: 1. There is a 12 cm span of highly suspicious calcifications involving the upper outer and upper inner quadrants of the left breast. On ultrasound, within this area of calcifications, there are confluent masses at 1 o'clock spanning at least 4.5 cm. An additional suspicious mass is seen in the left breast at 2 o'clock. 2. There are 2 lymph nodes with thickened cortices in the left axilla, and an additional lymph node with a borderline thickened cortex. RECOMMENDATION: 1. Ultrasound-guided biopsy is recommended for an area within the confluent masses in the left breast at 1 o'clock. 2. Ultrasound-guided biopsy is recommended for 1 of the thickened left axillary lymph nodes. 3. Stereotactic biopsy is recommended for the calcifications in the upper inner quadrant of the left breast. These procedures have been scheduled for 07/16/2020 at 10:30 a.m. I have discussed the findings and recommendations with the patient. If applicable, a reminder letter will be sent to the  patient regarding the next appointment. BI-RADS CATEGORY  5: Highly suggestive of malignancy. Electronically Signed   By: Ammie Ferrier M.D.   On: 07/01/2020 14:04   US BREAST LTD UNI LEFT INC AXILLA  Result Date: 07/01/2020 CLINICAL DATA:  Screening recall for left breast calcifications. EXAM: DIGITAL DIAGNOSTIC LEFT MAMMOGRAM WITH CAD LEFT BREAST ULTRASOUND COMPARISON:  Previous exam(s). We have attempted to obtain outside imaging prior to the May 20, 2020 screening mammogram, however they are unavailable for comparison. ACR Breast Density Category c: The breast tissue is heterogeneously dense, which may obscure small masses. FINDINGS: There are extensive pleomorphic calcifications involving the majority of the upper-outer quadrant and much of the upper inner quadrant of the left breast. The calcifications span about 12 cm in the medial to lateral dimension. Spot compression tomosynthesis images through the upper outer quadrant of the left breast demonstrate confluent distorted tissue without a definite discrete mass. Several enlarged lymph nodes are seen in the left axilla. Mammographic images were processed with CAD. Physical exam of the upper-outer quadrant of the left breast demonstrates a firm palpable region between 12 and 2 o'clock. Ultrasound targeted to the left breast at 1 o'clock, 5 cm from the nipple demonstrates a broad area of hypoechoic irregular tissue/confluent masses spanning at least 4.5 cm. There is a separate oval hypoechoic mass with indistinct margins at 2 o'clock, 7 cm from the nipple measuring 0.9 x 0.4 x 0.9 cm. Ultrasound of the left axilla demonstrates 2 abnormally thickened lymph nodes with cortices measuring 6-7 mm.  There is an additional lymph node with a borderline cortex of 4 mm. IMPRESSION: 1. There is a 12 cm span of highly suspicious calcifications involving the upper outer and upper inner quadrants of the left breast. On ultrasound, within this area of  calcifications, there are confluent masses at 1 o'clock spanning at least 4.5 cm. An additional suspicious mass is seen in the left breast at 2 o'clock. 2. There are 2 lymph nodes with thickened cortices in the left axilla, and an additional lymph node with a borderline thickened cortex. RECOMMENDATION: 1. Ultrasound-guided biopsy is recommended for an area within the confluent masses in the left breast at 1 o'clock. 2. Ultrasound-guided biopsy is recommended for 1 of the thickened left axillary lymph nodes. 3. Stereotactic biopsy is recommended for the calcifications in the upper inner quadrant of the left breast. These procedures have been scheduled for 07/16/2020 at 10:30 a.m. I have discussed the findings and recommendations with the patient. If applicable, a reminder letter will be sent to the patient regarding the next appointment. BI-RADS CATEGORY  5: Highly suggestive of malignancy. Electronically Signed   By: Ammie Ferrier M.D.   On: 07/01/2020 14:04   [place] AXILLARY NODE CORE BIOPSY LEFT  Addendum Date: 07/17/2020   ADDENDUM REPORT: 07/17/2020 14:54 ADDENDUM: Pathology revealed HIGH GRADE DUCTAL CARCINOMA ARISING IN A FIBROADENOMA WITH CALCIFICATIONS AND NECROSIS of the Left breast, upper inner quadrant. This was found to be concordant by Dr. Claudie Revering. 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 This was found to be concordant by Dr. Claudie Revering. Pathology revealed METASTATIC CARCINOMA INVOLVING A LYMPH NODE of the Left axilla. This was found to be concordant by Dr. Claudie Revering. Pathology results were discussed with the patient and her son, Frederik Pear by telephone, per request. The patient reported doing well after the biopsies with tenderness at the sites. Post biopsy instructions and care were reviewed and questions were answered. The patient was encouraged to call [facility] for any additional concerns. My direct phone  number was provided. The patient was referred to [facility] on July 22, 2020. Pathology results reported by Terie Purser, RN on 07/17/2020. Electronically Signed   By: Claudie Revering M.D.   On: 07/17/2020 14:54   Result Date: 07/17/2020 CLINICAL DATA:  12 cm area calcifications highly suspicious for malignancy in the upper outer and upper inner quadrants of the left breast at recent mammography. The portion in the upper inner quadrant spans 5.8 cm. There was also a 4.5 cm area of confluent masses in the 1 o'clock position of the left breast and additional 9 mm mass in the 2 o'clock position of the left breast recent ultrasound. Also demonstrated were 2 abnormal left axillary lymph nodes suspicious for metastatic nodes at ultrasound. EXAM: LEFT BREAST STEREOTACTIC CORE NEEDLE BIOPSY LEFT BREAST ULTRASOUND-GUIDED CORE NEEDLE BIOPSY LEFT AXILLA ULTRASOUND-GUIDED CORE NEEDLE BIOPSY COMPARISON:  Previous exams. FINDINGS: The patient and I discussed the procedures of stereotactic-guided biopsy and ultrasound guided biopsy including benefits and alternatives. We discussed the high likelihood of a successful procedures. We discussed the risks of the procedures including infection, bleeding, tissue injury, clip migration, and inadequate sampling. Informed written consent was given. The usual time out protocol was performed immediately prior to the procedures. SITE 1: 5.8 CM AREA OF CALCIFICATIONS IN THE UPPER INNER QUADRANT OF THE LEFT BREAST Lesion quadrant: Upper inner quadrant  Using sterile technique and 1% Lidocaine as local anesthetic, under stereotactic guidance, a 9 gauge vacuum assisted device was used to perform core needle biopsy of a 5.8 cm area of calcifications in the upper inner quadrant of the left breast which is part of the 12 cm area of calcifications involving the upper outer and upper inner quadrants of the breast, using a  medial approach. Specimen radiograph was performed showing a large number of calcifications in multiple specimens. Specimens with calcifications are identified for pathology. At the conclusion of the procedure, a coil shaped tissue marker clip was deployed into the biopsy cavity. Follow-up 2-view mammogram was performed and dictated separately. SITE 2: 4.5 CM AREA OF CONFLUENT MASSES IN THE 1 O'CLOCK POSITION OF THE LEFT BREAST Lesion quadrant: Upper outer quadrant Using sterile technique and 1% Lidocaine as local anesthetic, under direct ultrasound visualization, a 12 gauge spring-loaded device was used to perform biopsy of the recently demonstrated 4.5 cm area of confluent masses in the 1 o'clock position of the left breast using a caudal approach. At the conclusion of the procedure a ribbon shaped tissue marker clip was deployed into the biopsy cavity. Follow up 2 view mammogram was performed and dictated separately. SITE 3: ABNORMAL LEFT AXILLARY LYMPH NODE Using sterile technique and 1% Lidocaine as local anesthetic, under direct ultrasound visualization, a 12 gauge spring-loaded device was used to perform biopsy of 1 of the recently demonstrated 2 abnormal appearing left axillary lymph nodes using a caudal approach. At the conclusion of the procedure a heart shaped tissue marker clip was deployed into the biopsy cavity. Follow up 2 view mammogram was performed and dictated separately. IMPRESSION: Stereotactic-guided biopsy of a 5.8 cm area of calcifications in the upper inner quadrant of the left breast, 4.5 cm area of confluent masses in the 1 o'clock position of the left breast and an abnormal appearing left axillary lymph node. No apparent complications. Electronically Signed: By: Claudie Revering M.D. On: 07/16/2020 11:57   Intravitreal Injection, Pharmacologic Agent - OS - Left Eye  Result Date: 06/26/2020 Time Out 06/26/2020. 1:34 PM. Confirmed correct patient, procedure, site, and patient consented.  Anesthesia Topical anesthesia was used. Anesthetic medications included Lidocaine 2%, Proparacaine 0.5%. Procedure A supplied needle was used. Injection: 1.25 mg Bevacizumab (AVASTIN) SOLN   NDC: [address]-1, Lot: [id number] , Expiration date: 08/06/2020   Route: Intravitreal, Site: Left Eye, Waste: 0 mL Post-op Post injection exam found visual acuity of at least counting fingers. The patient tolerated the procedure well. There were no complications. The patient received written and verbal post procedure care education. Post injection medications were not given.   OCT, Retina - OU - Both Eyes  Result Date: 06/26/2020 Right Eye Quality was good. Central Foveal Thickness: 229. Progression has improved. Findings include normal foveal contour, intraretinal fluid, intraretinal hyper-reflective material, no SRF (Mild interval improvement in Focal edema temporal macula). Left Eye Quality was good. Central Foveal Thickness: 209. Progression has been stable. Findings include normal foveal contour, intraretinal fluid, no SRF (Scattered cystic changes greatest superiorly). 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 CME - cystoid macular edema. PED - pigment epithelial detachment. IRF - intraretinal fluid. SRF - subretinal fluid. EZ - ellipsoid zone. ERM - epiretinal membrane. ORA - outer retinal atrophy. ORT - outer retinal tubulation. SRHM - subretinal hyper-reflective  material. IRHM - intraretinal hyper-reflective material   MM CLIP PLACEMENT LEFT  Result Date: 07/16/2020 CLINICAL DATA:  Status post stereotactic guided core needle biopsy of a 5.8 cm area of calcifications in the upper inner quadrant of the left breast, ultrasound-guided core needle biopsy of a 4.5 cm area of  confluent masses in the 1 o'clock position of the left breast ultrasound-guided core needle biopsy of an abnormal appearing left axillary lymph node. EXAM: DIAGNOSTIC LEFT MAMMOGRAM POST STEREOTACTIC AND ULTRASOUND-GUIDED CORE NEEDLE BIOPSIES COMPARISON:  Previous exam(s). FINDINGS: Mammographic images were obtained following stereotactic guided biopsy of a 5.8 cm area of calcifications in the upper inner quadrant of the left breast, ultrasound-guided core needle biopsy of a 4.5 cm area of confluent masses in the 1 o'clock position of the left breast and ultrasound-guided core needle biopsy of an abnormal left axillary lymph node. The biopsy marking clips are in expected positions at the sites of biopsy. IMPRESSION: Appropriate positioning of the coil shaped biopsy marking clip at the site of biopsy in the upper inner quadrant the left breast,, ribbon shaped biopsy marker clip at the site of biopsy in the 1 o'clock position of the left breast and heart shaped biopsy marker clip in the biopsied left axillary lymph node. The ribbon shaped clip and coil shaped clip in the breast are located 4.4 cm apart. Final Assessment: Post Procedure Mammograms for Marker Placement Electronically Signed   By: Claudie Revering M.D.   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 This was found to be concordant by Dr. Claudie Revering. 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 This was found to be concordant by Dr. Claudie Revering. Pathology revealed METASTATIC CARCINOMA INVOLVING A LYMPH NODE of the Left axilla. This was found to be concordant by Dr. Claudie Revering. Pathology results were discussed with the patient and her son, Frederik Pear by  telephone, per request. The patient reported doing well after the biopsies with tenderness at the sites. Post biopsy instructions and care were reviewed and questions were answered. The patient was encouraged to call [facility] for any additional concerns. My direct phone number was provided. The patient was referred to [facility] on July 22, 2020. Pathology results reported by Terie Purser, RN on 07/17/2020. Electronically Signed   By: Claudie Revering M.D.   On: 07/17/2020 14:54   Result Date: 07/17/2020 CLINICAL DATA:  12 cm area calcifications highly suspicious for malignancy in the upper outer and upper inner quadrants of the left breast at recent mammography. The portion in the upper inner quadrant spans 5.8 cm. There was also a 4.5 cm area of confluent masses in the 1 o'clock position of the left breast and additional 9 mm mass in the 2 o'clock position of the left breast recent ultrasound. Also demonstrated were 2 abnormal left axillary lymph nodes suspicious for metastatic nodes at ultrasound. EXAM: LEFT BREAST STEREOTACTIC CORE NEEDLE BIOPSY LEFT BREAST ULTRASOUND-GUIDED CORE NEEDLE BIOPSY LEFT AXILLA ULTRASOUND-GUIDED CORE NEEDLE BIOPSY COMPARISON:  Previous exams. FINDINGS: The patient and I discussed the procedures of stereotactic-guided biopsy and ultrasound guided biopsy including benefits and alternatives. We discussed  the high likelihood of a successful procedures. We discussed the risks of the procedures including infection, bleeding, tissue injury, clip migration, and inadequate sampling. Informed written consent was given. The usual time out protocol was performed immediately prior to the procedures. SITE 1: 5.8 CM AREA OF CALCIFICATIONS IN THE UPPER INNER QUADRANT OF THE LEFT BREAST Lesion quadrant: Upper inner quadrant Using sterile technique and 1% Lidocaine as local anesthetic, under  stereotactic guidance, a 9 gauge vacuum assisted device was used to perform core needle biopsy of a 5.8 cm area of calcifications in the upper inner quadrant of the left breast which is part of the 12 cm area of calcifications involving the upper outer and upper inner quadrants of the breast, using a medial approach. Specimen radiograph was performed showing a large number of calcifications in multiple specimens. Specimens with calcifications are identified for pathology. At the conclusion of the procedure, a coil shaped tissue marker clip was deployed into the biopsy cavity. Follow-up 2-view mammogram was performed and dictated separately. SITE 2: 4.5 CM AREA OF CONFLUENT MASSES IN THE 1 O'CLOCK POSITION OF THE LEFT BREAST Lesion quadrant: Upper outer quadrant Using sterile technique and 1% Lidocaine as local anesthetic, under direct ultrasound visualization, a 12 gauge spring-loaded device was used to perform biopsy of the recently demonstrated 4.5 cm area of confluent masses in the 1 o'clock position of the left breast using a caudal approach. At the conclusion of the procedure a ribbon shaped tissue marker clip was deployed into the biopsy cavity. Follow up 2 view mammogram was performed and dictated separately. SITE 3: ABNORMAL LEFT AXILLARY LYMPH NODE Using sterile technique and 1% Lidocaine as local anesthetic, under direct ultrasound visualization, a 12 gauge spring-loaded device was used to perform biopsy of 1 of the recently demonstrated 2 abnormal appearing left axillary lymph nodes using a caudal approach. At the conclusion of the procedure a heart shaped tissue marker clip was deployed into the biopsy cavity. Follow up 2 view mammogram was performed and dictated separately. IMPRESSION: Stereotactic-guided biopsy of a 5.8 cm area of calcifications in the upper inner quadrant of the left breast, 4.5 cm area of confluent masses in the 1 o'clock position of the left breast and an abnormal appearing left  axillary lymph node. No apparent complications. Electronically Signed: By: Claudie Revering M.D. 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 This was found to be concordant by Dr. Claudie Revering. 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 This was found to be concordant by Dr. Claudie Revering. Pathology revealed METASTATIC CARCINOMA INVOLVING A LYMPH NODE of the Left axilla. This was found to be concordant by Dr. Claudie Revering. Pathology results were discussed with the patient and her son, Frederik Pear by telephone, per request. The patient reported doing well after the biopsies with tenderness at the sites. Post biopsy instructions and care were reviewed and questions were answered. The patient was encouraged to call [facility] for any additional concerns. My direct phone number was provided. The patient was referred to [facility] on July 22, 2020. Pathology results reported by Terie Purser, RN on 07/17/2020. Electronically Signed  By: Claudie Revering M.D.   On: 07/17/2020 14:54   Result Date: 07/17/2020 CLINICAL DATA:  12 cm area calcifications highly suspicious for malignancy in the upper outer and upper inner quadrants of the left breast at recent mammography. The portion in the upper inner quadrant spans 5.8 cm. There was also a 4.5 cm area of confluent masses in the 1 o'clock position of the left breast and additional 9 mm mass in the 2 o'clock position of the left breast recent ultrasound. Also demonstrated were 2 abnormal left axillary lymph nodes suspicious for metastatic nodes at ultrasound. EXAM: LEFT BREAST  STEREOTACTIC CORE NEEDLE BIOPSY LEFT BREAST ULTRASOUND-GUIDED CORE NEEDLE BIOPSY LEFT AXILLA ULTRASOUND-GUIDED CORE NEEDLE BIOPSY COMPARISON:  Previous exams. FINDINGS: The patient and I discussed the procedures of stereotactic-guided biopsy and ultrasound guided biopsy including benefits and alternatives. We discussed the high likelihood of a successful procedures. We discussed the risks of the procedures including infection, bleeding, tissue injury, clip migration, and inadequate sampling. Informed written consent was given. The usual time out protocol was performed immediately prior to the procedures. SITE 1: 5.8 CM AREA OF CALCIFICATIONS IN THE UPPER INNER QUADRANT OF THE LEFT BREAST Lesion quadrant: Upper inner quadrant Using sterile technique and 1% Lidocaine as local anesthetic, under stereotactic guidance, a 9 gauge vacuum assisted device was used to perform core needle biopsy of a 5.8 cm area of calcifications in the upper inner quadrant of the left breast which is part of the 12 cm area of calcifications involving the upper outer and upper inner quadrants of the breast, using a medial approach. Specimen radiograph was performed showing a large number of calcifications in multiple specimens. Specimens with calcifications are identified for pathology. At the conclusion of the procedure, a coil shaped tissue marker clip was deployed into the biopsy cavity. Follow-up 2-view mammogram was performed and dictated separately. SITE 2: 4.5 CM AREA OF CONFLUENT MASSES IN THE 1 O'CLOCK POSITION OF THE LEFT BREAST Lesion quadrant: Upper outer quadrant Using sterile technique and 1% Lidocaine as local anesthetic, under direct ultrasound visualization, a 12 gauge spring-loaded device was used to perform biopsy of the recently demonstrated 4.5 cm area of confluent masses in the 1 o'clock position of the left breast using a caudal approach. At the conclusion of the procedure a ribbon shaped tissue marker clip was deployed  into the biopsy cavity. Follow up 2 view mammogram was performed and dictated separately. SITE 3: ABNORMAL LEFT AXILLARY LYMPH NODE Using sterile technique and 1% Lidocaine as local anesthetic, under direct ultrasound visualization, a 12 gauge spring-loaded device was used to perform biopsy of 1 of the recently demonstrated 2 abnormal appearing left axillary lymph nodes using a caudal approach. At the conclusion of the procedure a heart shaped tissue marker clip was deployed into the biopsy cavity. Follow up 2 view mammogram was performed and dictated separately. IMPRESSION: Stereotactic-guided biopsy of a 5.8 cm area of calcifications in the upper inner quadrant of the left breast, 4.5 cm area of confluent masses in the 1 o'clock position of the left breast and an abnormal appearing left axillary lymph node. No apparent complications. Electronically Signed: By: Claudie Revering M.D. On: 07/16/2020 11:57    ASSESSMENT & PLAN:  Cheryl Blankenship is a 69 y.o. African American female with a history of Anxiety, CHF, DM, HTN   1. Malignant neoplasm of upper-outer quadrant of left breast, Stage IB, c(T2N1M0), ER+/PR+/HER2+, Grade II  -We discussed her image findings and the biopsy results in  great details. She has 12cm calcification and 4.5cm mass in her left breast with 2 abnormal LN. 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  -Given the large size of disease in her left breast, she likely need mastectomy. She is agreeable with that. She was seen by Dr. [name] today. I discussed surgery is the only way to cure her disease.  -Given her LN involvement, HER2 positive disease and size of masses, her disease is more aggressive. She has a high risk of recurrence after surgery.  -I recommend chemotherapy to reduce her risk of cancer recurrence. I discussed neoadjuvant vs adjuvant chemo, and discussed various chemo regimen. 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 I recommend THP q3weeks for 4-6 cycles or less intensive Kadcyla q3weeks as treatment given she has less interest in chemo. After surgery I recommend she continue anti-HER2 Herceptin or Kadcyla every q3weeks to complete 1 year of treatment. We reviewed the clinical data of these treatment options. Pt initially was reluctant to take chemo, after a lengthy discussion, she agreed with THP  --Chemo consent. Side effects include but does not limited to, fatigue, nausea, vomiting, diarrhea, hair loss (including small risk of permanent hair los), neuropathy, fluid retention, renal and kidney dysfunction, neutropenic fever, needed for blood transfusion, bleeding, were discussed with patient in great detail.  -the goal of chemo is curative  -Her son encourages more aggressive approach and her daughter encourages less intense option if interested. She has opted to proceed with THP, but has concerns for pain. If she does not tolerate, will reduce chemo dose or stop  -Given her LN involvement, Adjuvant radiation after surgery is recommended to reduce her risk of local recurrence. She will discuss this further with Dr Lisbeth Renshaw today.  -Given the strong ER and PR expression in her postmenopausal status, I recommend adjuvant endocrine therapy with aromatase inhibitor for a total of 5-10 years to reduce the risk of cancer recurrence.  -She will proceed with chemo education class and PAC placement before the start of chemo. She will monitor her heart with Echo while on Herceptin with her cardiologist.  -Labs reviewed, CBC and CMP WNL except BG 435, Cr 1.16, CA 10.7. Initial exam shows 7cm palpable mass -F/u with first cycle chemo    2. Genetic testing  -Given her mild family history of cancer (mother with Ovarian, niece and 2 cousins with cancer) she is  eligible for genetic testing. She is agreeable to proceed.   3. Comorbidities: CHF, poorly controlled DM with neuropathy, HTN, Arthritis in hips, Elevated Cr  -She has had recent changes to her DM medications which has lead to nausea. She takes Zofran as needed.  -Continue medications and f/u with PCP and Cardiologist Dr. Marisue Ivan.  -She had 2 falls in 2021. Workup indicated falls were related to multiple medications and poor sleep. She ambulates with cane. -Pt is on Lasix which she notes leads to urinary incontinence. She wears depends for this. -BG 435 today. Pt notes she only had an egg, coffee and Gatorade Zero today. I will copy note to her PCP  4. Social Support  -She does have decreased memory, she is on medication. Her family help her with history.  -She lives in [place] alone. She [place] currently able to take care of herself independently. Her daughter  is in town and her son is in [place]. She has other non-biological children.  -She plan to have care given at home soon.    PLAN:  -Send genetic referral  -Breast MRI in 1-2 weeks  -staging CT CAP w contrast and bone scan  -Chemo education class and PAC placement by Dr. [name] in 1-2 weeks  -echo next week, she will f/u with Dr. Audie Box  -F/u with first cycle chemo THP. Due to her poorly controlled DM, will not give dexa before and after chemo, no GCSF after chemo unless she develops significant neutropenia      No orders of the defined types were placed in this encounter.   All questions were answered. The patient knows to call the clinic with any problems, questions or concerns. The total time spent in the appointment was 70 minutes.     [name], MD 07/22/2020   I, Joslyn Devon, am acting as scribe for [name], MD.   I have reviewed the above documentation for accuracy and completeness, and I agree with the above.

## 2020-07-22 NOTE — H&P (Signed)
History of Present Illness Cheryl Blankenship. Cheryl Thorington MD; 07/22/2020 7:08 PM) The patient is a 69 year old female who presents with breast cancer. Breast MDC 07/22/20 Cheryl Blankenship  This is a 69 y.o. female seen in the multidisciplinary breast clinic for a new diagnosis of left breast cancer. The patient was noted to have screening detected calcifications and abnormal appearing lymph nodes. Closer examination revealed a large firm palpable mass in the upper outer quadrant. She underwent further diagnostic work-up which revealed a 12 cm span of highly suspicious calcifications in the upper outer and upper inner quadrants of the left breast. Wiithin this area by ultrasound there are confluent masses at the 1 o'clock position spanning at least 4.5 cm and an additional suspicious mass in the left breast at 2:00 was identified. This location was 7 cm from the nipple and measured 9 mm in greatest dimension. There were 2 left axillary lymph nodes that had cortical thickening and additional lymph node with a borderline cortex of 4 mm, totaling approximately 3 abnormal appearing nodes. She underwent a biopsy on 07/16/2020. 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 A second biopsy of another region of the upper inner quadrant also revealed IDC. She is seen today to discuss treatment recommendations for her cancer.  CLINICAL DATA: Screening recall for left breast calcifications.  EXAM: DIGITAL DIAGNOSTIC LEFT MAMMOGRAM WITH CAD  LEFT BREAST ULTRASOUND  COMPARISON: Previous exam(s). We have attempted to obtain outside imaging prior to the May 20, 2020 screening mammogram, however they are  unavailable for comparison.  ACR Breast Density Category c: The breast tissue is heterogeneously dense, which may obscure small masses.  FINDINGS: There are extensive pleomorphic calcifications involving the majority of the upper-outer quadrant and much of the upper inner quadrant of the left breast. The calcifications span about 12 cm in the medial to lateral dimension. Spot compression tomosynthesis images through the upper outer quadrant of the left breast demonstrate confluent distorted tissue without a definite discrete mass. Several enlarged lymph nodes are seen in the left axilla.  Mammographic images were processed with CAD.  Physical exam of the upper-outer quadrant of the left breast demonstrates a firm palpable region between 12 and 2 o'clock.  Ultrasound targeted to the left breast at 1 o'clock, 5 cm from the nipple demonstrates a broad area of hypoechoic irregular tissue/confluent masses spanning at least 4.5 cm. There is a separate oval hypoechoic mass with indistinct margins at 2 o'clock, 7 cm from the nipple measuring 0.9 x 0.4 x 0.9 cm. Ultrasound of the left axilla demonstrates 2 abnormally thickened lymph nodes with cortices measuring 6-7 mm. There is an additional lymph node with a borderline cortex of 4 mm.  IMPRESSION: 1. There is a 12 cm span of highly suspicious calcifications involving the upper outer and upper inner quadrants of the left breast. On ultrasound, within this area of calcifications, there are confluent masses at 1 o'clock spanning at least 4.5 cm. An additional suspicious mass is seen in the left breast at 2 o'clock.  2. There are 2 lymph nodes with thickened cortices in the left axilla, and an additional lymph node with a borderline thickened cortex.  RECOMMENDATION: 1. Ultrasound-guided biopsy is recommended  for an area within the confluent masses in the left breast at 1 o'clock.  2. Ultrasound-guided biopsy is recommended for 1 of  the thickened left axillary lymph nodes.  3. Stereotactic biopsy is recommended for the calcifications in the upper inner quadrant of the left breast.  These procedures have been scheduled for 07/16/2020 at 10:30 a.m.  I have discussed the findings and recommendations with the patient. If applicable, a reminder letter will be sent to the patient regarding the next appointment.  BI-RADS CATEGORY 5: Highly suggestive of malignancy.   Electronically Signed By: Ammie Ferrier M.D. On: 07/01/2020 14:04  CLINICAL DATA: 12 cm area calcifications highly suspicious for malignancy in the upper outer and upper inner quadrants of the left breast at recent mammography. The portion in the upper inner quadrant spans 5.8 cm. There was also a 4.5 cm area of confluent masses in the 1 o'clock position of the left breast and additional 9 mm mass in the 2 o'clock position of the left breast recent ultrasound. Also demonstrated were 2 abnormal left axillary lymph nodes suspicious for metastatic nodes at ultrasound.  EXAM: LEFT BREAST STEREOTACTIC CORE NEEDLE BIOPSY  LEFT BREAST ULTRASOUND-GUIDED CORE NEEDLE BIOPSY  LEFT AXILLA ULTRASOUND-GUIDED CORE NEEDLE BIOPSY  COMPARISON: Previous exams.  FINDINGS: The patient and I discussed the procedures of stereotactic-guided biopsy and ultrasound guided biopsy including benefits and alternatives. We discussed the high likelihood of a successful procedures. We discussed the risks of the procedures including infection, bleeding, tissue injury, clip migration, and inadequate sampling. Informed written consent was given. The usual time out protocol was performed immediately prior to the procedures.  SITE 1: 5.8 CM AREA OF CALCIFICATIONS IN THE UPPER INNER QUADRANT OF THE LEFT BREAST  Lesion quadrant: Upper inner quadrant  Using sterile technique and 1% Lidocaine as local anesthetic, under stereotactic guidance, a 9 gauge vacuum assisted  device was used to perform core needle biopsy of a 5.8 cm area of calcifications in the upper inner quadrant of the left breast which is part of the 12 cm area of calcifications involving the upper outer and upper inner quadrants of the breast, using a medial approach. Specimen radiograph was performed showing a large number of calcifications in multiple specimens. Specimens with calcifications are identified for pathology.  At the conclusion of the procedure, a coil shaped tissue marker clip was deployed into the biopsy cavity. Follow-up 2-view mammogram was performed and dictated separately.  SITE 2: 4.5 CM AREA OF CONFLUENT MASSES IN THE 1 O'CLOCK POSITION OF THE LEFT BREAST  Lesion quadrant: Upper outer quadrant  Using sterile technique and 1% Lidocaine as local anesthetic, under direct ultrasound visualization, a 12 gauge spring-loaded device was used to perform biopsy of the recently demonstrated 4.5 cm area of confluent masses in the 1 o'clock position of the left breast using a caudal approach. At the conclusion of the procedure a ribbon shaped tissue marker clip was deployed into the biopsy cavity. Follow up 2 view mammogram was performed and dictated separately.  SITE 3: ABNORMAL LEFT AXILLARY LYMPH NODE  Using sterile technique and 1% Lidocaine as local anesthetic, under direct ultrasound visualization, a 12 gauge spring-loaded device was used to perform biopsy of 1 of the recently demonstrated 2 abnormal appearing left axillary lymph nodes using a caudal approach. At the conclusion of the procedure a heart shaped tissue marker clip was deployed into the biopsy cavity. Follow up 2 view mammogram was performed and dictated separately.  IMPRESSION: Stereotactic-guided biopsy of a 5.8  cm area of calcifications in the upper inner quadrant of the left breast, 4.5 cm area of confluent masses in the 1 o'clock position of the left breast and an abnormal appearing left  axillary lymph node. No apparent complications.  Electronically Signed: By: Claudie Revering M.D. On: 07/16/2020 11:57    Past Surgical History Conni Slipper, RN; 07/22/2020 8:20 AM) Breast Biopsy Left. Cataract Surgery Bilateral. Cesarean Section - Multiple Gallbladder Surgery - Laparoscopic  Diagnostic Studies History Conni Slipper, RN; 07/22/2020 8:20 AM) Colonoscopy 1-5 years ago Mammogram within last year Pap Smear 1-5 years ago  Allergies [name] Sheyla Zaffino, MD; 07/22/2020 7:09 PM) Lisinopril *ANTIHYPERTENSIVES*  Medication History Cheryl Blankenship. Melvin Whiteford, MD; 07/22/2020 7:09 PM) Medications Reconciled Aspirin (81MG Tablet DR, Oral) Active. Norvasc (10MG Tablet, Oral) Active. Lipitor (40MG Tablet, Oral) Active. Cholecalciferol (25 MCG(1000 UT) Capsule, Oral) Active. Cymbalta (30MG Capsule DR Part, Oral) Active. Jardiance (25MG Tablet, Oral) Active. Folic Acid (1MG Tablet, Oral) Active. Lasix (40MG Tablet, Oral) Active. Neurontin (400MG Capsule, Oral) Active. glipiZIDE XL (5MG Tablet ER 24HR, Oral) Active. Lantus SoloStar (100UNIT/ML Soln Pen-inj, Subcutaneous) Active. NovoLIN R (100UNIT/ML Solution, Injection) Active. Cozaar (100MG Tablet, Oral) Active. Glucophage (1000MG Tablet, Oral) Active. Lopressor (50MG Tablet, Oral) Active. PriLOSEC (20MG Capsule DR, Oral) Active. Zofran ODT (4MG Tablet Disint, Oral) Active. Potassium Chloride (10MEQ Tablet ER, Oral) Active. Lyrica (75MG Capsule, Oral) Active. Zanaflex (4MG Capsule, Oral) Active. traMADol HCl (50MG Tablet, Oral) Active.  Social History Conni Slipper, RN; 07/22/2020 8:20 AM) Caffeine use Coffee, Tea. No alcohol use No drug use Tobacco use Former smoker.  Family History Conni Slipper, RN; 07/22/2020 8:20 AM) Arthritis Brother, Mother. Bleeding disorder Mother. Cerebrovascular Accident Mother. Cervical Cancer Mother. Depression Son. Diabetes Mellitus Mother, [name], [name]. Heart  Disease Brother, Son. Heart disease in female family member before age 59 Hypertension Mother, Son. Ovarian Cancer Sister. Seizure disorder Son.  Pregnancy / Birth History Conni Slipper, RN; 07/22/2020 8:20 AM) Age at menarche 43 years. Age of menopause <47 Contraceptive History Intrauterine device, Oral contraceptives. Gravida 4 Length (months) of breastfeeding 3-6 Maternal age 77-20 Para 3 Regular periods  Other Problems Conni Slipper, RN; 07/22/2020 8:20 AM) Arthritis Congestive Heart Failure Diabetes Mellitus Gastroesophageal Reflux Disease High blood pressure Hypercholesterolemia Myocardial infarction     Review of Systems Conni Slipper RN; 07/22/2020 8:20 AM) General Not Present- Appetite Loss, Chills, Fatigue, Fever, Night Sweats, Weight Gain and Weight Loss. Skin Not Present- Change in Wart/Mole, Dryness, Hives, Jaundice, New Lesions, Non-Healing Wounds, Rash and Ulcer. HEENT Present- Wears glasses/contact lenses. Not Present- Earache, Hearing Loss, Hoarseness, Nose Bleed, Oral Ulcers, Ringing in the Ears, Seasonal Allergies, Sinus Pain, Sore Throat, Visual Disturbances and Yellow Eyes. Respiratory Present- Snoring. Not Present- Bloody sputum, Chronic Cough, Difficulty Breathing and Wheezing. Breast Present- Breast Pain. Not Present- Breast Mass, Nipple Discharge and Skin Changes. Cardiovascular Present- Leg Cramps, Rapid Heart Rate, Shortness of Breath and Swelling of Extremities. Not Present- Chest Pain, Difficulty Breathing Lying Down and Palpitations. Gastrointestinal Present- Nausea. Not Present- Abdominal Pain, Bloating, Bloody Stool, Change in Bowel Habits, Chronic diarrhea, Constipation, Difficulty Swallowing, Excessive gas, Gets full quickly at meals, Hemorrhoids, Indigestion, Rectal Pain and Vomiting. Female Genitourinary Present- Frequency. Not Present- Nocturia, Painful Urination, Pelvic Pain and Urgency. Musculoskeletal Present- Joint Stiffness.  Not Present- Back Pain, Joint Pain, Muscle Pain, Muscle Weakness and Swelling of Extremities. Neurological Present- Numbness and Weakness. Not Present- Decreased Memory, Fainting, Headaches, Seizures, Tingling, Tremor and Trouble walking. Psychiatric Not Present- Anxiety, Bipolar, Change in Sleep Pattern, Depression, Fearful and  Frequent crying. Endocrine Present- Hot flashes. Not Present- Cold Intolerance, Excessive Hunger, Hair Changes, Heat Intolerance and New Diabetes. Hematology Present- Blood Thinners. Not Present- Easy Bruising, Excessive bleeding, Gland problems, HIV and Persistent Infections.   Physical Exam [name] Corran Lalone MD; 07/22/2020 7:11 PM)  The physical exam findings are as follows: Note:Constitutional: WDWN in NAD, conversant, no obvious deformities; resting comfortably Eyes: Pupils equal, round; sclera anicteric; moist conjunctiva; no lid lag HENT: Oral mucosa moist; good dentition Neck: No masses palpated, trachea midline; no thyromegaly Lungs: CTA bilaterally; normal respiratory effort CV: Regular rate and rhythm; no murmurs; extremities well-perfused with no edema Breasts: symmetric; no nipple retraction or discharge; no palpable lymphadenopathy; large firm 8 cm mass L upper outer quadrant; no overlying skin changes Abd: +bowel sounds, soft, non-tender, no palpable organomegaly; no palpable hernias Musc: Normal gait; no apparent clubbing or cyanosis in extremities Lymphatic: No palpable cervical or axillary lymphadenopathy Skin: Warm, dry; no sign of jaundice Psychiatric - alert and oriented x 4; calm mood and affect; seems to have some short-term memory difficulties    Assessment & Plan [name] 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 The surgical procedure has been discussed with the patient. Potential risks, benefits, alternative  treatments, and expected outcomes have been explained. All of the patient's questions at this time have been answered. The likelihood of reaching the patient's treatment goal is good. The patient understand the proposed surgical procedure and wishes to proceed. Note:We spent time with the patient and her two family members discussing her disease and the treatment options. This is a fairly large tumor, so surgery at this time would only consist of mastectomy and axillary lymph node dissection. However, after discussion with Oncology, they have opted to try neoadjuvant chemotherapy with the goal of reducing the size of the tumor that might allow breast conserving surgery with lumpectomy/ targeted axillary lymph node dissection. They are in agreement with this plan, so we will proceed with port placement the day before her first scheduled chemotherapy treatment.  Cheryl Blankenship. [name], MD, [facility] Surgery  General/ Trauma Surgery   07/22/2020 7:19 PM

## 2020-07-22 NOTE — Therapy (Signed)
[place], [place], [address] Phone: [phone]   Fax:  [phone]  Physical Therapy Evaluation  Patient Details  Name: Cheryl Blankenship MRN: [id number] Date of Birth: 29-Apr-1951 Referring Provider (PT): Dr. [name]   Encounter Date: 07/22/2020   PT End of Session - 07/22/20 1659    Visit Number 1    Number of Visits 2    Date for PT Re-Evaluation 01/19/21    PT Start Time 8675    PT Stop Time 1638    PT Time Calculation (min) 29 min    Activity Tolerance Patient tolerated treatment well    Behavior During Therapy [facility] for tasks assessed/performed           Past Medical History:  Diagnosis Date  . Anxiety   . Breast cancer ([place])   . CHF (congestive heart failure) ([place])   . Diabetes mellitus without complication ([place])   . Hypertension     Past Surgical History:  Procedure Laterality Date  . APPENDECTOMY    . CHOLECYSTECTOMY    . STOMACH SURGERY      There were no vitals filed for this visit.    Subjective Assessment - 07/22/20 1643    Subjective Patient reports she is here today to be seen by hre medical team for her newly diagnosed left breast cancer.    Patient is accompained by: Family member    Pertinent History Patient was diagnosed on 05/20/2020 with left grade II invasive ductal carcinoma breast cancer. It measures 12 cm and is mostly located in the upper outer quadrant. It is ER/PR positive and HER2 negative with a Ki67 of 25%. She has a hx of a heart attack, stroke, and heart failure. She is very lmied functionally and currently walks with a cane although reports she needs to pick up her walker that has been ordered.    Patient Stated Goals reduce lymphedema risk and learn post op shoulder ROM HEP    Currently in Pain? Yes    Pain Score --   Varies   Pain Location Breast    Pain Orientation Left    Pain Descriptors / Indicators Aching    Pain Type Surgical pain    Pain  Onset 1 to 4 weeks ago    Pain Frequency Intermittent    Aggravating Factors  Unknown    Pain Relieving Factors Unknown              [facility] PT Assessment - 07/22/20 0001      Assessment   Medical Diagnosis left breast cancer    Referring Provider (PT) Dr. [name]    Onset Date/Surgical Date 05/20/20    Hand Dominance Right    Prior Therapy none      Precautions   Precautions Other (comment)    Precaution Comments active cancer      Restrictions   Weight Bearing Restrictions No      Balance Screen   Has the patient fallen in the past 6 months Yes    How many times? 2    Has the patient had a decrease in activity level because of a fear of falling?  Yes    Is the patient reluctant to leave their home because of a fear of falling?  No      Home Environment   Living Environment Private residence    Living Arrangements Alone    Available Help at Discharge Family  Prior Function   Level of Independence Independent with household mobility with device    Vocation On disability    Leisure She does not exercise      Cognition   Overall Cognitive Status Within Functional Limits for tasks assessed      Posture/Postural Control   Posture/Postural Control Postural limitations    Postural Limitations Rounded Shoulders;Forward head;Increased thoracic kyphosis      ROM / Strength   AROM / PROM / Strength AROM;Strength      AROM   Overall AROM Comments Shoulder AROM measured in sitting due to balance deficits    AROM Assessment Site Shoulder;Cervical    Right/Left Shoulder Right;Left    Right Shoulder Extension 13 Degrees    Right Shoulder Flexion 106 Degrees    Right Shoulder ABduction 103 Degrees    Right Shoulder Internal Rotation 33 Degrees    Right Shoulder External Rotation 67 Degrees    Left Shoulder Extension 0 Degrees    Left Shoulder Flexion 31 Degrees    Left Shoulder ABduction 60 Degrees    Left Shoulder Internal Rotation --   Not tested due to  limited abduction   Left Shoulder External Rotation --   Not tested due to limited abduction   Cervical Flexion WNL    Cervical Extension 25% limited    Cervical - Right Side Bend 50% limited    Cervical - Left Side Bend 50% limited    Cervical - Right Rotation 25% limited    Cervical - Left Rotation 25% limited      Strength   Overall Strength Unable to assess;Due to pain             LYMPHEDEMA/ONCOLOGY QUESTIONNAIRE - 07/22/20 0001      Type   Cancer Type Left breast cancer      Lymphedema Assessments   Lymphedema Assessments Upper extremities      Right Upper Extremity Lymphedema   10 cm Proximal to Olecranon Process 38.5 cm    Olecranon Process 28.7 cm    10 cm Proximal to Ulnar Styloid Process 27.8 cm    Just Proximal to Ulnar Styloid Process 18.1 cm    Across Hand at [employer] 21.1 cm    At [place] of 2nd Digit 7.3 cm      Left Upper Extremity Lymphedema   10 cm Proximal to Olecranon Process 40 cm    Olecranon Process 29.5 cm    10 cm Proximal to Ulnar Styloid Process 27.1 cm    Just Proximal to Ulnar Styloid Process 19.3 cm    Across Hand at [employer] 21.2 cm    At [place] of 2nd Digit 7.1 cm           L-DEX FLOWSHEETS - 07/22/20 1600      L-DEX LYMPHEDEMA SCREENING   Measurement Type --   Not tested; pt unable to safely step on scale & stand indep.          The patient was assessed using the L-Dex machine today to produce a lymphedema index baseline score. The patient will be reassessed on a regular basis (typically every 3 months) to obtain new L-Dex scores. If the score is > 6.5 points away from his/her baseline score indicating onset of subclinical lymphedema, it will be recommended to wear a compression garment for 4 weeks, 12 hours per day and then be reassessed. If the score continues to be > 6.5 points from baseline at reassessment, we will initiate  lymphedema treatment. Assessing in this manner has a 95% rate of preventing clinically  significant lymphedema.      Katina Dung - 07/22/20 0001    Open a tight or new jar Severe difficulty    Do heavy household chores (wash walls, wash floors) Mild difficulty    Carry a shopping bag or briefcase Mild difficulty    Wash your back Severe difficulty    Use a knife to cut food No difficulty    Recreational activities in which you take some force or impact through your arm, shoulder, or hand (golf, hammering, tennis) Severe difficulty    During the past week, to what extent has your arm, shoulder or hand problem interfered with your normal social activities with family, friends, neighbors, or groups? Extremely    During the past week, to what extent has your arm, shoulder or hand problem limited your work or other regular daily activities Quite a bit    Arm, shoulder, or hand pain. Severe    Tingling (pins and needles) in your arm, shoulder, or hand Severe    Difficulty Sleeping So much difficuSo much difficulty, I can't sleep    DASH Score 63.64 %            Objective measurements completed on examination: See above findings.       Patient was instructed today in a home exercise program today for post op shoulder range of motion. These included active assist shoulder flexion in sitting, scapular retraction, wall walking with shoulder abduction, and hands behind head external rotation.  She was encouraged to do these twice a day, holding 3 seconds and repeating 5 times when permitted by her physician.            PT Education - 07/22/20 1658    Education Details Lymphedema risk reduction and post op shoulder ROM HEP    Person(s) Educated Patient;Other (comment)   Son and "spirit sister"   Methods Explanation;Demonstration;Handout    Comprehension Returned demonstration;Verbalized understanding               PT Long Term Goals - 07/22/20 1705      PT LONG TERM GOAL #1   Title Patient will demonstrate she has regained shoulder ROM to be function and equal  to or greater than her right arm post operatively.    Time 6    Period Months    Status New    Target Date 01/19/21           Breast Clinic Goals - 07/22/20 1705      Patient will be able to verbalize understanding of pertinent lymphedema risk reduction practices relevant to her diagnosis specifically related to skin care.   Time 1    Period Days    Status Achieved      Patient will be able to return demonstrate and/or verbalize understanding of the post-op home exercise program related to regaining shoulder range of motion.   Time 1    Period Days    Status Achieved      Patient will be able to verbalize understanding of the importance of attending the postoperative After Breast Cancer Class for further lymphedema risk reduction education and therapeutic exercise.   Time 1    Period Days    Status Achieved                 Plan - 07/22/20 1659    Clinical Impression Statement Patient was diagnosed on 05/20/2020 with left  grade II invasive ductal carcinoma breast cancer. It measures 12 cm and is mostly located in the upper outer quadrant. It is ER/PR positive and HER2 negative with a Ki67 of 25%. She has a hx of a heart attack, stroke, and heart failure. She is very lmied functionally and currently walks with a cane although reports she needs to pick up her walker that has been ordered. Her multidisciplinary medical team met prior to her assessments to determine a recommended treatment plan. She is planning to have neoadjuvant chemotherapy followed by a left breast surgery to be determined and a targeted axillary lymph node dissection followed by radiation and anti-estrogen therapy. She will benefit from PT to address balance deficits and also regain shoulder ROM as she is severaly limited at baseline.    Personal Factors and Comorbidities Comorbidity 1;Comorbidity 2    Comorbidities Hx heart attack, CVA, CHF; multiple recent falls    Examination-Activity Limitations Locomotion  Level;Reach Overhead;Stand    Stability/Clinical Decision Making Evolving/Moderate complexity    Clinical Decision Making Moderate    Rehab Potential Fair    PT Frequency --   Eval and 1 f/u visit to reassess   PT Treatment/Interventions ADLs/Self Care Home Management;Therapeutic exercise;Patient/family education    PT Next Visit Plan Will reassess 3-4 weeks post op    PT Home Exercise Plan Post op shoulder ROM HEP    Consulted and Agree with Plan of Care Patient;Family member/caregiver    Family Member Consulted Son and "spirit sister"           Patient will benefit from skilled therapeutic intervention in order to improve the following deficits and impairments:  Postural dysfunction, Decreased range of motion, Decreased knowledge of precautions, Impaired UE functional use, Pain, Decreased balance, Decreased activity tolerance  Visit Diagnosis: Malignant neoplasm of upper-outer quadrant of left breast in female, estrogen receptor positive ([place]) - Plan: PT plan of care cert/re-cert  Abnormal posture - Plan: PT plan of care cert/re-cert  Difficulty in walking, not elsewhere classified - Plan: PT plan of care cert/re-cert  Repeated falls - Plan: PT plan of care cert/re-cert  Stiffness of left shoulder, not elsewhere classified - Plan: PT plan of care cert/re-cert   Patient will follow up at outpatient cancer rehab 3-4 weeks following surgery.  If the patient requires physical therapy at that time, a specific plan will be dictated and sent to the referring physician for approval. The patient was educated today on appropriate basic range of motion exercises to begin post operatively and the importance of attending the After Breast Cancer class following surgery.  Patient was educated today on lymphedema risk reduction practices as it pertains to recommendations that will benefit the patient immediately following surgery.  She verbalized good understanding.      Problem List Patient  Active Problem List   Diagnosis Date Noted  . Malignant neoplasm of upper-outer quadrant of left breast in female, estrogen receptor positive ([place]) 07/20/2020   Annia Friendly, PT 07/22/20 5:08 PM  [place], [place], [address] Phone: [phone]   Fax:  [phone]  Name: Cheryl Blankenship MRN: [id number] Date of Birth: 12-26-1950

## 2020-07-22 NOTE — H&P (View-Only) (Signed)
History of Present Illness Cheryl Blankenship. Cheryl Highfill MD; 07/22/2020 7:08 PM) The patient is a 69 year old female who presents with breast cancer. Breast MDC 07/22/20 Cheryl Blankenship  This is a 69 y.o. female seen in the multidisciplinary breast clinic for a new diagnosis of left breast cancer. The patient was noted to have screening detected calcifications and abnormal appearing lymph nodes. Closer examination revealed a large firm palpable mass in the upper outer quadrant. She underwent further diagnostic work-up which revealed a 12 cm span of highly suspicious calcifications in the upper outer and upper inner quadrants of the left breast. Wiithin this area by ultrasound there are confluent masses at the 1 o'clock position spanning at least 4.5 cm and an additional suspicious mass in the left breast at 2:00 was identified. This location was 7 cm from the nipple and measured 9 mm in greatest dimension. There were 2 left axillary lymph nodes that had cortical thickening and additional lymph node with a borderline cortex of 4 mm, totaling approximately 3 abnormal appearing nodes. She underwent a biopsy on 07/16/2020. 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 A second biopsy of another region of the upper inner quadrant also revealed IDC. She is seen today to discuss treatment recommendations for her cancer.  CLINICAL DATA: Screening recall for left breast calcifications.  EXAM: DIGITAL DIAGNOSTIC LEFT MAMMOGRAM WITH CAD  LEFT BREAST ULTRASOUND  COMPARISON: Previous exam(s). We have attempted to obtain outside imaging prior to the May 20, 2020 screening mammogram, however they are  unavailable for comparison.  ACR Breast Density Category c: The breast tissue is heterogeneously dense, which may obscure small masses.  FINDINGS: There are extensive pleomorphic calcifications involving the majority of the upper-outer quadrant and much of the upper inner quadrant of the left breast. The calcifications span about 12 cm in the medial to lateral dimension. Spot compression tomosynthesis images through the upper outer quadrant of the left breast demonstrate confluent distorted tissue without a definite discrete mass. Several enlarged lymph nodes are seen in the left axilla.  Mammographic images were processed with CAD.  Physical exam of the upper-outer quadrant of the left breast demonstrates a firm palpable region between 12 and 2 o'clock.  Ultrasound targeted to the left breast at 1 o'clock, 5 cm from the nipple demonstrates a broad area of hypoechoic irregular tissue/confluent masses spanning at least 4.5 cm. There is a separate oval hypoechoic mass with indistinct margins at 2 o'clock, 7 cm from the nipple measuring 0.9 x 0.4 x 0.9 cm. Ultrasound of the left axilla demonstrates 2 abnormally thickened lymph nodes with cortices measuring 6-7 mm. There is an additional lymph node with a borderline cortex of 4 mm.  IMPRESSION: 1. There is a 12 cm span of highly suspicious calcifications involving the upper outer and upper inner quadrants of the left breast. On ultrasound, within this area of calcifications, there are confluent masses at 1 o'clock spanning at least 4.5 cm. An additional suspicious mass is seen in the left breast at 2 o'clock.  2. There are 2 lymph nodes with thickened cortices in the left axilla, and an additional lymph node with a borderline thickened cortex.  RECOMMENDATION: 1. Ultrasound-guided biopsy is recommended  for an area within the confluent masses in the left breast at 1 o'clock.  2. Ultrasound-guided biopsy is recommended for 1 of  the thickened left axillary lymph nodes.  3. Stereotactic biopsy is recommended for the calcifications in the upper inner quadrant of the left breast.  These procedures have been scheduled for 07/16/2020 at 10:30 a.m.  I have discussed the findings and recommendations with the patient. If applicable, a reminder letter will be sent to the patient regarding the next appointment.  BI-RADS CATEGORY 5: Highly suggestive of malignancy.   Electronically Signed By: Ammie Ferrier M.D. On: 07/01/2020 14:04  CLINICAL DATA: 12 cm area calcifications highly suspicious for malignancy in the upper outer and upper inner quadrants of the left breast at recent mammography. The portion in the upper inner quadrant spans 5.8 cm. There was also a 4.5 cm area of confluent masses in the 1 o'clock position of the left breast and additional 9 mm mass in the 2 o'clock position of the left breast recent ultrasound. Also demonstrated were 2 abnormal left axillary lymph nodes suspicious for metastatic nodes at ultrasound.  EXAM: LEFT BREAST STEREOTACTIC CORE NEEDLE BIOPSY  LEFT BREAST ULTRASOUND-GUIDED CORE NEEDLE BIOPSY  LEFT AXILLA ULTRASOUND-GUIDED CORE NEEDLE BIOPSY  COMPARISON: Previous exams.  FINDINGS: The patient and I discussed the procedures of stereotactic-guided biopsy and ultrasound guided biopsy including benefits and alternatives. We discussed the high likelihood of a successful procedures. We discussed the risks of the procedures including infection, bleeding, tissue injury, clip migration, and inadequate sampling. Informed written consent was given. The usual time out protocol was performed immediately prior to the procedures.  SITE 1: 5.8 CM AREA OF CALCIFICATIONS IN THE UPPER INNER QUADRANT OF THE LEFT BREAST  Lesion quadrant: Upper inner quadrant  Using sterile technique and 1% Lidocaine as local anesthetic, under stereotactic guidance, a 9 gauge vacuum assisted  device was used to perform core needle biopsy of a 5.8 cm area of calcifications in the upper inner quadrant of the left breast which is part of the 12 cm area of calcifications involving the upper outer and upper inner quadrants of the breast, using a medial approach. Specimen radiograph was performed showing a large number of calcifications in multiple specimens. Specimens with calcifications are identified for pathology.  At the conclusion of the procedure, a coil shaped tissue marker clip was deployed into the biopsy cavity. Follow-up 2-view mammogram was performed and dictated separately.  SITE 2: 4.5 CM AREA OF CONFLUENT MASSES IN THE 1 O'CLOCK POSITION OF THE LEFT BREAST  Lesion quadrant: Upper outer quadrant  Using sterile technique and 1% Lidocaine as local anesthetic, under direct ultrasound visualization, a 12 gauge spring-loaded device was used to perform biopsy of the recently demonstrated 4.5 cm area of confluent masses in the 1 o'clock position of the left breast using a caudal approach. At the conclusion of the procedure a ribbon shaped tissue marker clip was deployed into the biopsy cavity. Follow up 2 view mammogram was performed and dictated separately.  SITE 3: ABNORMAL LEFT AXILLARY LYMPH NODE  Using sterile technique and 1% Lidocaine as local anesthetic, under direct ultrasound visualization, a 12 gauge spring-loaded device was used to perform biopsy of 1 of the recently demonstrated 2 abnormal appearing left axillary lymph nodes using a caudal approach. At the conclusion of the procedure a heart shaped tissue marker clip was deployed into the biopsy cavity. Follow up 2 view mammogram was performed and dictated separately.  IMPRESSION: Stereotactic-guided biopsy of a 5.8  cm area of calcifications in the upper inner quadrant of the left breast, 4.5 cm area of confluent masses in the 1 o'clock position of the left breast and an abnormal appearing left  axillary lymph node. No apparent complications.  Electronically Signed: By: Cheryl Blankenship M.D. On: 07/16/2020 11:57    Past Surgical History Cheryl Slipper, RN; 07/22/2020 8:20 AM) Breast Biopsy Left. Cataract Surgery Bilateral. Cesarean Section - Multiple Gallbladder Surgery - Laparoscopic  Diagnostic Studies History Cheryl Slipper, RN; 07/22/2020 8:20 AM) Colonoscopy 1-5 years ago Mammogram within last year Pap Smear 1-5 years ago  Allergies Cheryl Key K. Eriyanna Kofoed, MD; 07/22/2020 7:09 PM) Lisinopril *ANTIHYPERTENSIVES*  Medication History Cheryl Blankenship. Ambre Kobayashi, MD; 07/22/2020 7:09 PM) Medications Reconciled Aspirin (81MG Tablet DR, Oral) Active. Norvasc (10MG Tablet, Oral) Active. Lipitor (40MG Tablet, Oral) Active. Cholecalciferol (25 MCG(1000 UT) Capsule, Oral) Active. Cymbalta (30MG Capsule DR Part, Oral) Active. Jardiance (25MG Tablet, Oral) Active. Folic Acid (1MG Tablet, Oral) Active. Lasix (40MG Tablet, Oral) Active. Neurontin (400MG Capsule, Oral) Active. glipiZIDE XL (5MG Tablet ER 24HR, Oral) Active. Lantus SoloStar (100UNIT/ML Soln Pen-inj, Subcutaneous) Active. NovoLIN R (100UNIT/ML Solution, Injection) Active. Cozaar (100MG Tablet, Oral) Active. Glucophage (1000MG Tablet, Oral) Active. Lopressor (50MG Tablet, Oral) Active. PriLOSEC (20MG Capsule DR, Oral) Active. Zofran ODT (4MG Tablet Disint, Oral) Active. Potassium Chloride (10MEQ Tablet ER, Oral) Active. Lyrica (75MG Capsule, Oral) Active. Zanaflex (4MG Capsule, Oral) Active. traMADol HCl (50MG Tablet, Oral) Active.  Social History Cheryl Slipper, RN; 07/22/2020 8:20 AM) Caffeine use Coffee, Tea. No alcohol use No drug use Tobacco use Former smoker.  Family History Cheryl Slipper, RN; 07/22/2020 8:20 AM) Arthritis Brother, Mother. Bleeding disorder Mother. Cerebrovascular Accident Mother. Cervical Cancer Mother. Depression Son. Diabetes Mellitus Mother, [name], [name]. Heart  Disease Brother, Son. Heart disease in female family member before age 48 Hypertension Mother, Son. Ovarian Cancer Sister. Seizure disorder Son.  Pregnancy / Birth History Cheryl Slipper, RN; 07/22/2020 8:20 AM) Age at menarche 83 years. Age of menopause <47 Contraceptive History Intrauterine device, Oral contraceptives. Gravida 4 Length (months) of breastfeeding 3-6 Maternal age 32-20 Para 3 Regular periods  Other Problems Cheryl Slipper, RN; 07/22/2020 8:20 AM) Arthritis Congestive Heart Failure Diabetes Mellitus Gastroesophageal Reflux Disease High blood pressure Hypercholesterolemia Myocardial infarction     Review of Systems Cheryl Slipper RN; 07/22/2020 8:20 AM) General Not Present- Appetite Loss, Chills, Fatigue, Fever, Night Sweats, Weight Gain and Weight Loss. Skin Not Present- Change in Wart/Mole, Dryness, Hives, Jaundice, New Lesions, Non-Healing Wounds, Rash and Ulcer. HEENT Present- Wears glasses/contact lenses. Not Present- Earache, Hearing Loss, Hoarseness, Nose Bleed, Oral Ulcers, Ringing in the Ears, Seasonal Allergies, Sinus Pain, Sore Throat, Visual Disturbances and Yellow Eyes. Respiratory Present- Snoring. Not Present- Bloody sputum, Chronic Cough, Difficulty Breathing and Wheezing. Breast Present- Breast Pain. Not Present- Breast Mass, Nipple Discharge and Skin Changes. Cardiovascular Present- Leg Cramps, Rapid Heart Rate, Shortness of Breath and Swelling of Extremities. Not Present- Chest Pain, Difficulty Breathing Lying Down and Palpitations. Gastrointestinal Present- Nausea. Not Present- Abdominal Pain, Bloating, Bloody Stool, Change in Bowel Habits, Chronic diarrhea, Constipation, Difficulty Swallowing, Excessive gas, Gets full quickly at meals, Hemorrhoids, Indigestion, Rectal Pain and Vomiting. Female Genitourinary Present- Frequency. Not Present- Nocturia, Painful Urination, Pelvic Pain and Urgency. Musculoskeletal Present- Joint Stiffness.  Not Present- Back Pain, Joint Pain, Muscle Pain, Muscle Weakness and Swelling of Extremities. Neurological Present- Numbness and Weakness. Not Present- Decreased Memory, Fainting, Headaches, Seizures, Tingling, Tremor and Trouble walking. Psychiatric Not Present- Anxiety, Bipolar, Change in Sleep Pattern, Depression, Fearful and  Frequent crying. Endocrine Present- Hot flashes. Not Present- Cold Intolerance, Excessive Hunger, Hair Changes, Heat Intolerance and New Diabetes. Hematology Present- Blood Thinners. Not Present- Easy Bruising, Excessive bleeding, Gland problems, HIV and Persistent Infections.   Physical Exam Cheryl Key K. Aldin Drees MD; 07/22/2020 7:11 PM)  The physical exam findings are as follows: Note:Constitutional: WDWN in NAD, conversant, no obvious deformities; resting comfortably Eyes: Pupils equal, round; sclera anicteric; moist conjunctiva; no lid lag HENT: Oral mucosa moist; good dentition Neck: No masses palpated, trachea midline; no thyromegaly Lungs: CTA bilaterally; normal respiratory effort CV: Regular rate and rhythm; no murmurs; extremities well-perfused with no edema Breasts: symmetric; no nipple retraction or discharge; no palpable lymphadenopathy; large firm 8 cm mass L upper outer quadrant; no overlying skin changes Abd: +bowel sounds, soft, non-tender, no palpable organomegaly; no palpable hernias Musc: Normal gait; no apparent clubbing or cyanosis in extremities Lymphatic: No palpable cervical or axillary lymphadenopathy Skin: Warm, dry; no sign of jaundice Psychiatric - alert and oriented x 4; calm mood and affect; seems to have some short-term memory difficulties    Assessment & Plan Cheryl Key K. 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 The surgical procedure has been discussed with the patient. Potential risks, benefits, alternative  treatments, and expected outcomes have been explained. All of the patient's questions at this time have been answered. The likelihood of reaching the patient's treatment goal is good. The patient understand the proposed surgical procedure and wishes to proceed. Note:We spent time with the patient and her two family members discussing her disease and the treatment options. This is a fairly large tumor, so surgery at this time would only consist of mastectomy and axillary lymph node dissection. However, after discussion with Oncology, they have opted to try neoadjuvant chemotherapy with the goal of reducing the size of the tumor that might allow breast conserving surgery with lumpectomy/ targeted axillary lymph node dissection. They are in agreement with this plan, so we will proceed with port placement the day before her first scheduled chemotherapy treatment.  Cheryl Blankenship. [name], MD, [facility]  General/ Trauma Surgery   07/22/2020 7:19 PM

## 2020-07-22 NOTE — Progress Notes (Signed)
START OFF PATHWAY REGIMEN - Breast   [id number]:THP (Docetaxel 75 mg/m2 IV D1 + Trastuzumab 8/6 mg/kg IV D1 + Pertuzumab 840/420 mg IV D1) q21 Days:   Cycle 1: A cycle is 21 days:     Pertuzumab      Trastuzumab-xxxx      Docetaxel    Cycles 2 and beyond: A cycle is every 21 days:     Pertuzumab      Trastuzumab-xxxx      Docetaxel   **Always confirm dose/schedule in your pharmacy ordering system**  Patient Characteristics: Preoperative or Nonsurgical Candidate (Clinical Staging), Neoadjuvant Therapy followed by Surgery, Invasive Disease, Chemotherapy, HER2 Positive, ER Positive Therapeutic Status: Preoperative or Nonsurgical Candidate (Clinical Staging) AJCC M Category: cM0 AJCC Grade: G2 Breast Surgical Plan: Neoadjuvant Therapy followed by Surgery ER Status: Positive (+) AJCC 8 Stage Grouping: IIA HER2 Status: Positive (+) AJCC T Category: cT3 AJCC N Category: cN1 PR Status: Positive (+) Intent of Therapy: Curative Intent, Discussed with Patient

## 2020-07-24 ENCOUNTER — Encounter: Payer: Self-pay | Admitting: General Practice

## 2020-07-24 ENCOUNTER — Telehealth: Payer: Self-pay | Admitting: *Deleted

## 2020-07-24 NOTE — Progress Notes (Signed)
[place] Psychosocial Distress Screening Spiritual Care  Met with Cheryl Blankenship briefly in [facility] and then, because she was overwhelmed and tired, followed up by phone to introduce Support Center team/resources, reviewing distress screen per protocol.  The patient scored a 4 on the Psychosocial Distress Thermometer which indicates moderate distress. Also assessed for distress and other psychosocial needs.   ONCBCN DISTRESS SCREENING 07/24/2020  Screening Type Initial Screening  Distress experienced in past week (1-10) 4  Information Concerns Type Lack of info about diagnosis;Lack of info about treatment;Lack of info about complementary therapy choices  Physical Problem type Nausea/vomiting;Sleep/insomnia;Loss of appetitie;Tingling hands/feet  Referral to support programs Yes   Cheryl Blankenship is using prayer and busyness to keep her mind off cancer so that she can focus on the business of living. Going to church is an important means of meaning-making in her life.   Follow up needed: No. At this time, Cheryl Blankenship finds it helpful to take a break from thinking about her diagnosis and treatment, but she plans to phone chaplain when she is ready to talk more.   [place], [place], [facility] Pager [phone] Voicemail [phone]

## 2020-07-24 NOTE — Telephone Encounter (Signed)
Called and spoke with daughter to let her know the reasoning for doing the CT/Bone scan versus starting with a PET. She verbalized understanding and she states she was calling GI when she got off the phone to schedule the MRI.

## 2020-07-27 ENCOUNTER — Encounter: Payer: Medicare HMO | Admitting: Genetic Counselor

## 2020-07-28 ENCOUNTER — Other Ambulatory Visit: Payer: Self-pay | Admitting: Student

## 2020-07-28 ENCOUNTER — Encounter: Payer: Self-pay | Admitting: *Deleted

## 2020-07-28 ENCOUNTER — Telehealth: Payer: Self-pay | Admitting: *Deleted

## 2020-07-28 ENCOUNTER — Ambulatory Visit: Payer: Self-pay | Admitting: Surgery

## 2020-07-28 DIAGNOSIS — R6889 Other general symptoms and signs: Secondary | ICD-10-CM

## 2020-07-28 NOTE — Telephone Encounter (Signed)
Spoke to pt daughter regarding BMDC from 11.3.21. Relate pt has decided she would like to move forward with mastectomy with port placement followed by chemotherapy. Discussed will inform physician team and call back with final plan. Received verbal understanding. Confirmed future appts.No further needs or questions voiced.  Physician team notified of pt decision.

## 2020-07-29 ENCOUNTER — Ambulatory Visit: Payer: Self-pay | Admitting: Surgery

## 2020-07-29 ENCOUNTER — Telehealth: Payer: Self-pay | Admitting: Hematology

## 2020-07-29 NOTE — Telephone Encounter (Signed)
Scheduled per 11/10 email. Called pt daughter and confirmed 12/9 appt

## 2020-07-29 NOTE — Progress Notes (Signed)
Patient's chart and labs reviewed with Dr Sabra Heck and he states states that due to pt's poor diabetes control, A1c 11.5 and Glucose >400 she will be better served being done at [facility]. Debi Lisi at Dr Tseui's office notified of need to move pt.

## 2020-07-30 ENCOUNTER — Telehealth: Payer: Self-pay | Admitting: *Deleted

## 2020-07-30 NOTE — Telephone Encounter (Signed)
Spoke to pt daughter. Pt has now decided to move forward with chemo 1st. Physician team notified. Informed daughter that regardless if pt changes her mind on sx or chemo first to still have breast MRI on 11/12. Received verbal understanding. Informed will call back regarding scan appt.

## 2020-07-31 ENCOUNTER — Ambulatory Visit
Admission: RE | Admit: 2020-07-31 | Discharge: 2020-07-31 | Disposition: A | Discharge status: Home / Self Care | Payer: Medicare HMO | Source: Ambulatory Visit | Attending: Hematology | Admitting: Hematology

## 2020-07-31 ENCOUNTER — Telehealth: Payer: Self-pay | Admitting: Physician Assistant

## 2020-07-31 ENCOUNTER — Inpatient Hospital Stay: Payer: Medicare HMO

## 2020-07-31 ENCOUNTER — Other Ambulatory Visit: Payer: Self-pay

## 2020-07-31 ENCOUNTER — Ambulatory Visit: Payer: Self-pay | Admitting: Surgery

## 2020-07-31 ENCOUNTER — Ambulatory Visit (HOSPITAL_COMMUNITY)
Admission: RE | Admit: 2020-07-31 | Discharge: 2020-07-31 | Disposition: A | Discharge status: Home / Self Care | Payer: Medicare HMO | Source: Ambulatory Visit | Attending: Hematology | Admitting: Hematology

## 2020-07-31 ENCOUNTER — Other Ambulatory Visit (HOSPITAL_COMMUNITY): Payer: Medicare HMO

## 2020-07-31 DIAGNOSIS — Z170 Estrogen receptor positive status [ER+]: Secondary | ICD-10-CM

## 2020-07-31 DIAGNOSIS — I509 Heart failure, unspecified: Secondary | ICD-10-CM | POA: Diagnosis not present

## 2020-07-31 DIAGNOSIS — I110 Hypertensive heart disease with heart failure: Secondary | ICD-10-CM | POA: Insufficient documentation

## 2020-07-31 DIAGNOSIS — Z0189 Encounter for other specified special examinations: Secondary | ICD-10-CM | POA: Diagnosis not present

## 2020-07-31 DIAGNOSIS — E119 Type 2 diabetes mellitus without complications: Secondary | ICD-10-CM | POA: Diagnosis not present

## 2020-07-31 DIAGNOSIS — C50412 Malignant neoplasm of upper-outer quadrant of left female breast: Secondary | ICD-10-CM | POA: Insufficient documentation

## 2020-07-31 LAB — ECHOCARDIOGRAM COMPLETE
Area-P 1/2: 2.87 cm2
S' Lateral: 3.3 cm

## 2020-07-31 IMAGING — MR MR BREAST BILAT WO/W CM
8 of 12 series · 32 of 48 positions shown · IV contrast (gadavist)
Comparison: Previous exam(s).

CLINICAL DATA: Patient with recent diagnosis of left breast
invasive ductal carcinoma and ductal carcinoma in situ. Additionally
metastatic carcinoma involving a left axillary lymph node.

EXAM:
BILATERAL BREAST MRI WITH AND WITHOUT CONTRAST
TECHNIQUE: Multiplanar, multisequence MR images of both breasts were obtained
prior to and following the intravenous administration of 9 ml of
Gadavist

[Series 2: t2_tirm_tra ipat (a-p) · axial · 3.0mm · 0.74mm/px · 1 of 64 slices shown]
[im 1/64]
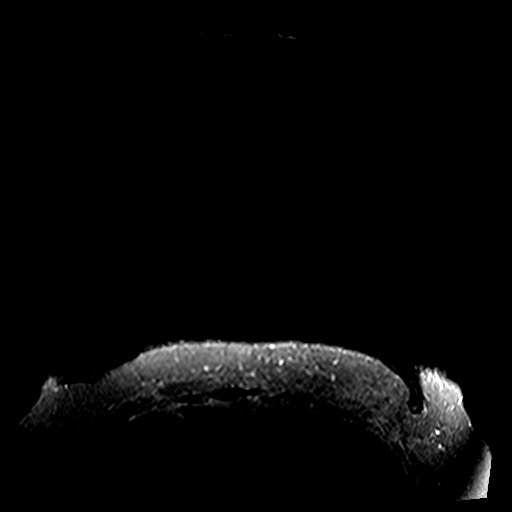

[Series 3: fl3d pre-cm no · axial · non-contrast · 1.2mm · 0.99mm/px · z∈[-60,+130]mm · 5 of 160 slices shown]
[im 1/160]
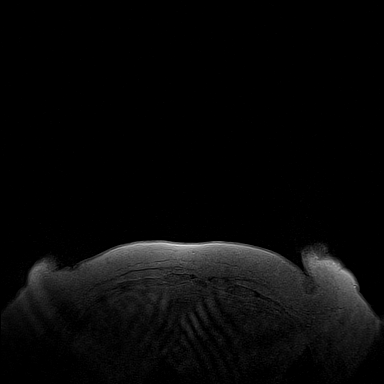
[im 40/160]
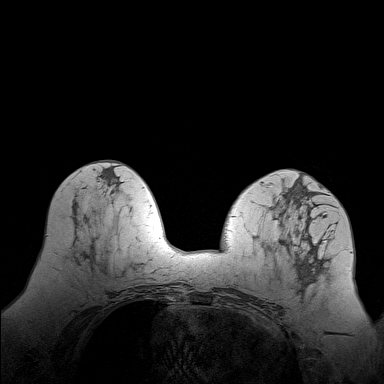
[im 80/160]
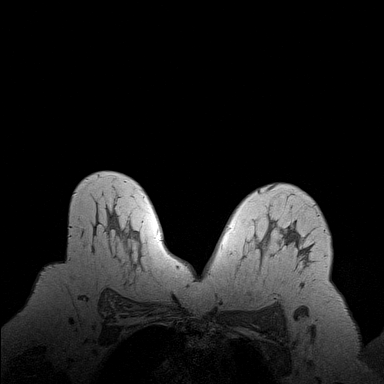
[im 120/160]
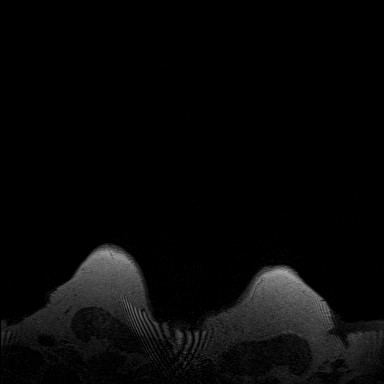
[im 160/160]
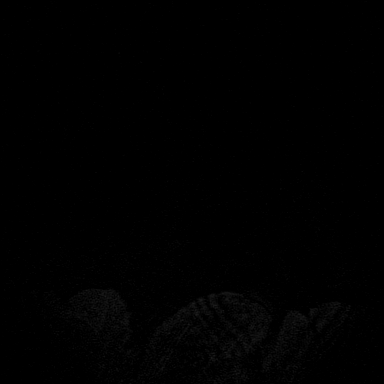

[Series 4: fl3d pre-cm · axial · non-contrast · 1.2mm · 0.99mm/px · z∈[-60,+130]mm · 5 of 160 slices shown]
[im 1/160]
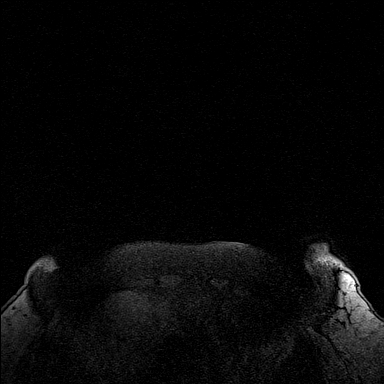
[im 40/160]
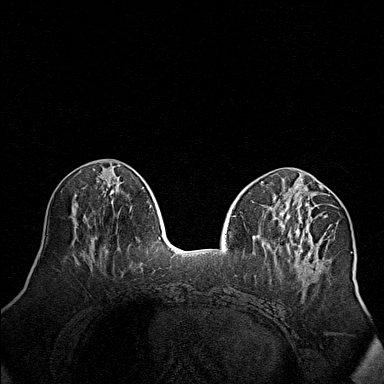
[im 80/160]
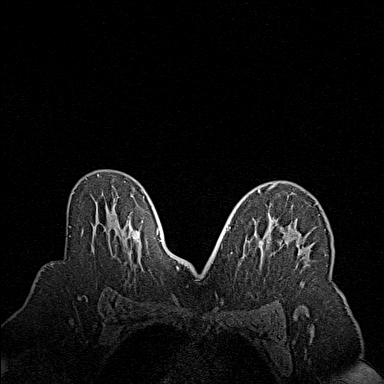
[im 120/160]
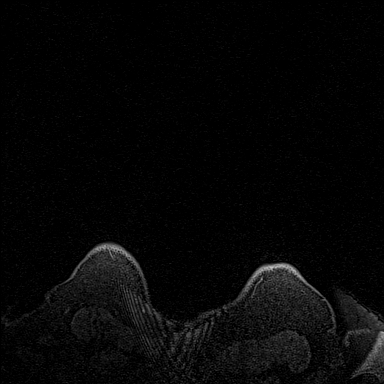
[im 160/160]
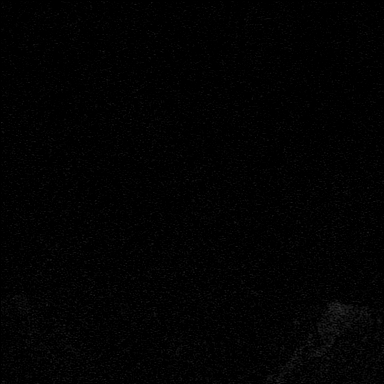

[Series 5: fl3d post-cm 20 · axial · 1.2mm · 0.99mm/px · z∈[-60,+130]mm · 5 of 160 slices shown (1 of 3)]
[im 1/160]
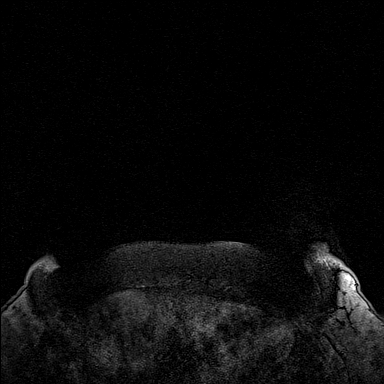
[im 40/160]
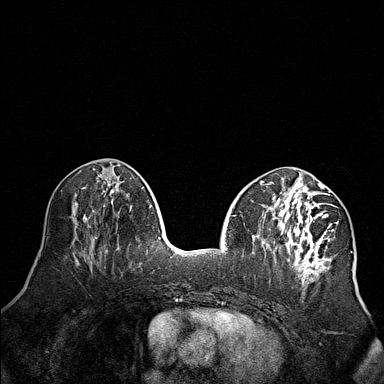
[im 80/160]
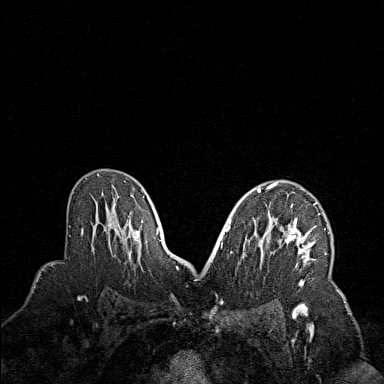
[im 120/160]
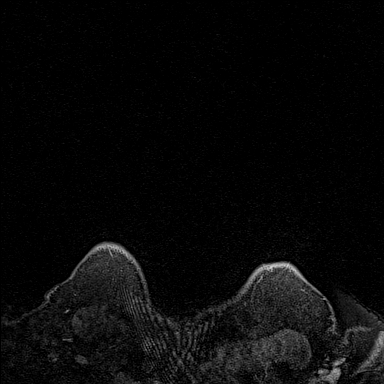
[im 160/160]
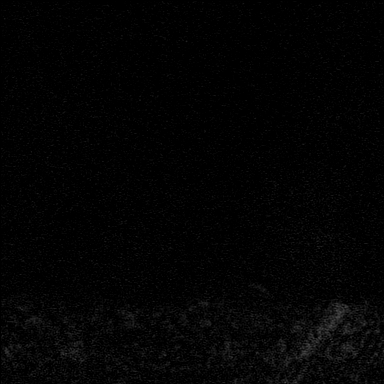

[Series 6: fl3d post-cm 20 · axial · 1.2mm · 0.99mm/px · z∈[-60,+130]mm · 5 of 160 slices shown (2 of 3)]
[im 1/160]
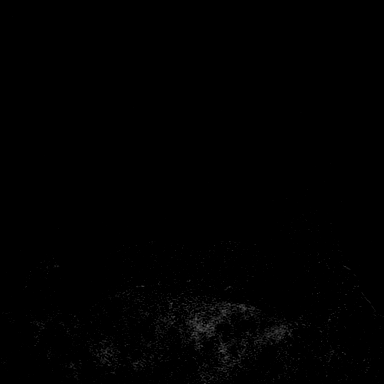
[im 40/160]
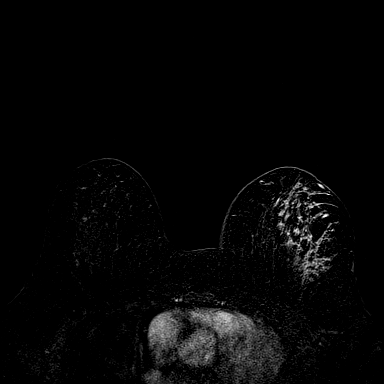
[im 80/160]
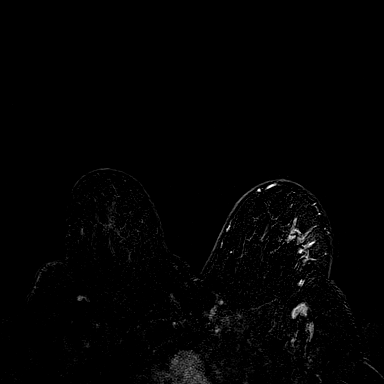
[im 120/160]
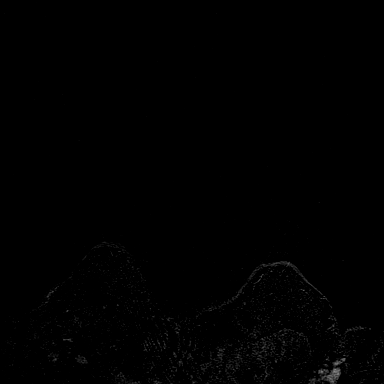
[im 160/160]
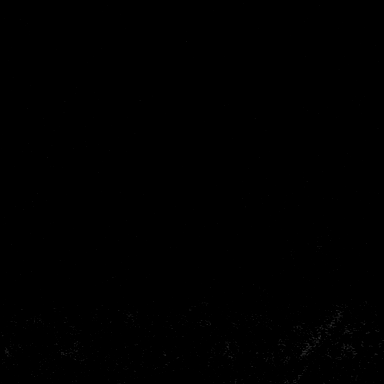

[Series 7: fl3d post-cm 20 · axial · 192.0mm · 0.99mm/px · 1 of 1 slices shown (3 of 3)]
[im 1/1]
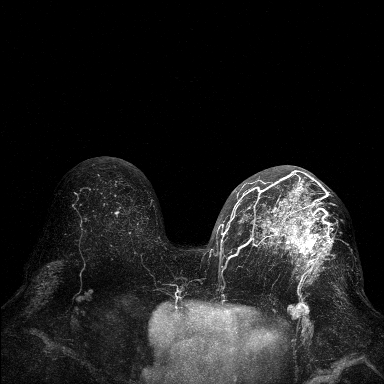

[Series 8: fl3d post-cm 3min · axial · 1.2mm · 0.99mm/px · z∈[-60,+130]mm · 6 of 160 slices shown]
[im 1/160]
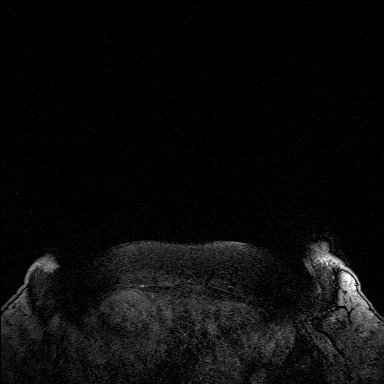
[im 32/160]
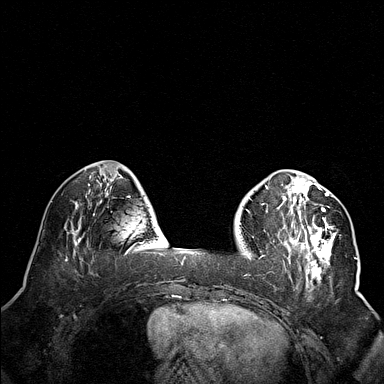
[im 64/160]
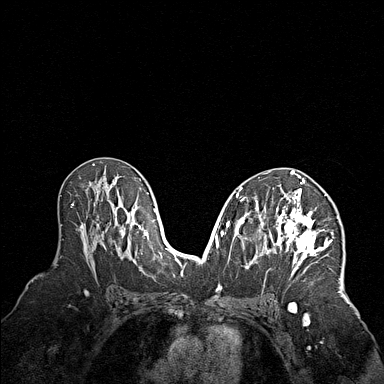
[im 96/160]
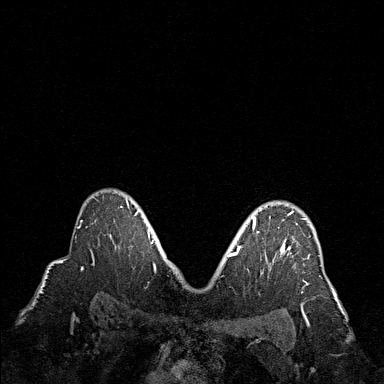
[im 128/160]
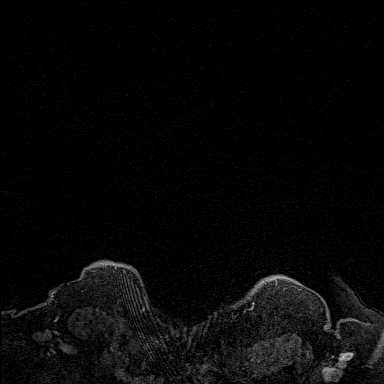
[im 160/160]
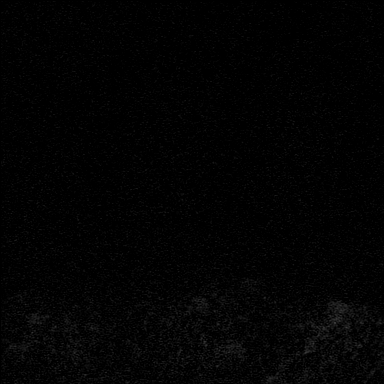

[Series 9: fl3d post-cm 3min_sub · axial · 1.2mm · 0.99mm/px · z∈[-60,+54]mm · 4 of 160 slices shown]
[im 1/160]
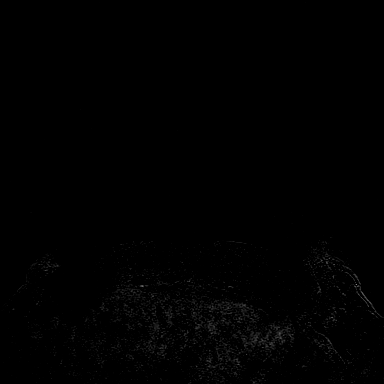
[im 32/160]
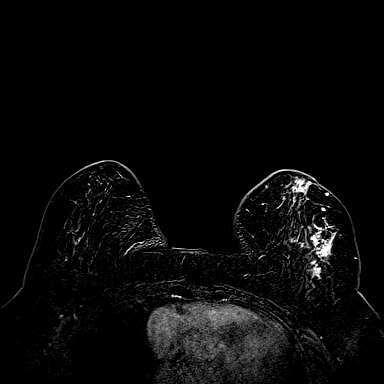
[im 64/160]
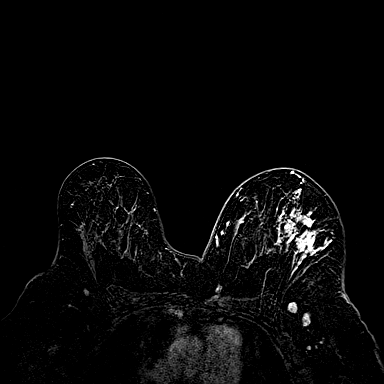
[im 96/160]
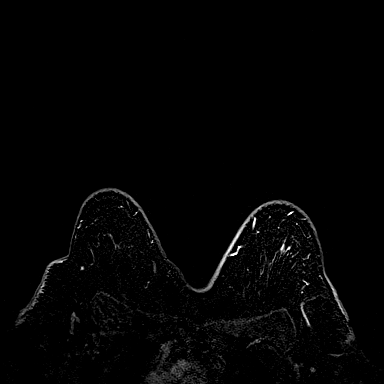

[32 of 48 positions shown; findings below may reference images not displayed]

Three-dimensional MR images were rendered by post-processing of the
original MR data on an independent workstation. The
three-dimensional MR images were interpreted, and findings are
reported in the following complete MRI report for this study. Three
dimensional images were evaluated at the independent interpreting
workstation using the DynaCAD thin client.
FINDINGS: Breast composition: c. Heterogeneous fibroglandular tissue.

Background parenchymal enhancement: Mild

Right breast: Within the upper inner right breast middle depth there
is an 8 mm irregular enhancing mass (image 113; series 9). Scattered
foci of enhancement demonstrated within the right breast.

Left breast: There is extensive non mass enhancement within the left
breast involving the upper outer, lower outer and lower inner
quadrants. The non mass enhancement extends from the nipple
posteriorly towards the chest wall. Overall findings are most
compatible with extensive malignancy involving the majority of the
left breast.

Lymph nodes: There are 3 morphologically abnormal lymph nodes within
the left axilla. One of these nodes demonstrates susceptibility
artifact and has been biopsied. The cortex measures 11 mm.

Ancillary findings:  None.
IMPRESSION: 1. Extensive non mass enhancement within the left breast extending
from the nipple to the chest wall. Overall findings are most
compatible with extensive malignancy involving the majority of the
left breast.
2. Indeterminate enhancing mass within the upper inner right breast.
3. Three morphologically abnormal lymph nodes within the left
axilla. One of these nodes has been biopsied compatible with
metastatic adenopathy.

RECOMMENDATION:
MRI guided core needle biopsy irregular enhancing mass within the
upper inner right breast.

Treatment plan for extensive left breast malignancy.

BI-RADS CATEGORY  4: Suspicious.

## 2020-07-31 MED ORDER — GADOBUTROL 1 MMOL/ML IV SOLN
9.0000 mL | Freq: Once | INTRAVENOUS | Status: AC | PRN
  Administered 2020-07-31: 9 mL via INTRAVENOUS

## 2020-07-31 NOTE — Progress Notes (Signed)
  Echocardiogram 2D Echocardiogram has been performed.  Cheryl Blankenship 07/31/2020, 12:43 PM

## 2020-07-31 NOTE — Progress Notes (Signed)
.  The following biosimilar Kanjinti (trastuzumab-anns) has been selected for use in this patient per insurance.  Henreitta Leber, PharmD

## 2020-07-31 NOTE — Progress Notes (Signed)
Cheryl Blankenship   Telephone:([phone] Fax:([phone]) [phone]   Clinic Follow up Note   Patient Care Team: [name], PA-C as PCP - General (Internal Medicine) [name], RN as Oncology Nurse Navigator [name], RN as Oncology Nurse Navigator [name], MD as Consulting Physician (General Surgery) [name], MD as Consulting Physician (Hematology) [name], MD as Consulting Physician (Radiation Oncology)  Date of Service:  08/05/2020  CHIEF COMPLAINT: F/u of left breast cancer   SUMMARY OF ONCOLOGIC HISTORY: Oncology History Overview Note  Cancer Staging Malignant neoplasm of upper-outer quadrant of left breast in female, estrogen receptor positive ([place]) Staging form: Breast, AJCC 8th Edition - Clinical stage from 07/16/2020: Stage IB (cT2, cN1, cM0, G2, ER+, PR+, HER2+) - Signed by [name], MD on 07/21/2020    Malignant neoplasm of upper-outer quadrant of left breast in female, estrogen receptor positive ([place])  07/01/2020 Mammogram   IMPRESSION: 1. There is a 12 cm span of highly suspicious calcifications involving the upper outer and upper inner quadrants of the left breast. On ultrasound, within this area of calcifications, there are confluent masses at 1 o'clock spanning at least 4.5 cm. An additional suspicious mass is seen in the left breast at 2 o'clock.  -Ultrasound targeted to the left breast at 1 o'clock, 5 cm from the nipple demonstrates a broad area of hypoechoic irregular tissue/confluent masses spanning at least 4.5 cm. There is a separate oval hypoechoic mass with indistinct margins at 2 o'clock, 7 cm from the nipple measuring 0.9 x 0.4 x 0.9 cm. Ultrasound of the left axilla demonstrates 2 abnormally thickened lymph nodes with cortices measuring 6-7 mm. There is an additional lymph node with a borderline cortex of 4 mm.   2. There are 2 lymph nodes with thickened cortices in the left axilla, and an additional  lymph node with a borderline thickened cortex.   07/16/2020 Cancer Staging   Staging form: Breast, AJCC 8th Edition - Clinical stage from 07/16/2020: Stage IIA (cT3, cN1, cM0, G2, ER+, PR+, HER2+) - Signed by [name], MD on 08/05/2020   07/16/2020 Initial Biopsy   Diagnosis 1. 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 Breast, left, needle core biopsy, 1 o'clock - INVASIVE DUCTAL CARCINOMA - SEE COMMENT 3. Lymph node, biopsy - METASTATIC CARCINOMA INVOLVING A LYMPH NODE - SEE COMMENT Microscopic Comment 1. Based on the biopsy, the ductal carcinoma in situ has a comedo pattern, high nuclear grade and measures 0.7 cm in greatest linear extent. Prognostic markers (ER/PR) are pending and will be reported in an addendum. 2. Based on the biopsy, the carcinoma appears Nottingham grade 2 of 3 and measures 1.5 cm in greatest linear extent. Prognostic markers (ER/PR/ki-67/HER2) are pending and will be reported in an addendum. Dr. Saralyn Pilar reviewed the case and agrees with the above diagnosis. These results were called to [facility] on July 17, 2020. 3. Based on the biopsy, the carcinoma appears measures 1.0 cm in greatest linear extent. Prognostic markers (ER/PR/ki-67/HER2) are pending and will be reported in an addendum.   07/16/2020 Receptors her2   1. PROGNOSTIC INDICATORS Results: IMMUNOHISTOCHEMICAL AND MORPHOMETRIC ANALYSIS PERFORMED MANUALLY Estrogen Receptor: 30%, POSITIVE, STRONG STAINING INTENSITY Progesterone Receptor: 5%, POSITIVE, STRONG STAINING INTENSITY  2. PROGNOSTIC INDICATORS Results: IMMUNOHISTOCHEMICAL AND MORPHOMETRIC ANALYSIS PERFORMED MANUALLY The tumor cells are POSITIVE for Her2 (3+). Estrogen Receptor: 85%, POSITIVE, STRONG  STAINING INTENSITY Progesterone Receptor: 60%, POSITIVE, STRONG STAINING INTENSITY Proliferation Marker  Ki67: 25%  3. PROGNOSTIC INDICATORS Results: IMMUNOHISTOCHEMICAL AND MORPHOMETRIC ANALYSIS PERFORMED MANUALLY The tumor cells are POSITIVE for Her2 (3+). Estrogen Receptor: 95%, POSITIVE, STRONG STAINING INTENSITY Progesterone Receptor: 45%, POSITIVE, STRONG STAINING INTENSITY Proliferation Marker Ki67: 30%   07/20/2020 Initial Diagnosis   Malignant neoplasm of upper-outer quadrant of left breast in female, estrogen receptor positive ([place])   07/31/2020 Echocardiogram   Baseline Echo  IMPRESSIONS     1. Left ventricular ejection fraction, by estimation, is 65 to 70%. The  left ventricle has normal function. The left ventricle has no regional  wall motion abnormalities. Left ventricular diastolic parameters are  indeterminate. The average left  ventricular global longitudinal strain is -17.1 %.   2. Right ventricular systolic function is normal. The right ventricular  size is normal. There is normal pulmonary artery systolic pressure.   3. The mitral valve is normal in structure. Trivial mitral valve  regurgitation. No evidence of mitral stenosis.   4. The aortic valve is normal in structure. Aortic valve regurgitation is  not visualized. No aortic stenosis is present.    07/31/2020 Breast MRI   IMPRESSION: 1. Extensive non mass enhancement within the left breast extending from the nipple to the chest wall. Overall findings are most compatible with extensive malignancy involving the majority of the left breast. 2. Indeterminate enhancing mass within the upper inner right breast. 3. Three morphologically abnormal lymph nodes within the left axilla. One of these nodes has been biopsied compatible with metastatic adenopathy.     08/04/2020 Procedure   PAC placement by Dr [name]   08/05/2020 -  Neo-Adjuvant Anti-estrogen oral therapy   Neoadjuvant THP q3weeks for 4-6 cycles starting 08/05/20, followed by Herceptin or Kadcyla q3weeks to complete 1 year of treatment.        CURRENT THERAPY:  Neoadjuvant THP q3weeks for 4-6 cycles starting 08/05/20, followed by Herceptin or Kadcyla q3weeks to complete 1 year of treatment.   INTERVAL HISTORY:  Cheryl Blankenship is here for a follow up. She presents to the clinic with her daughter. She notes she took her long acting insulin last night and hg BG was 168. She thought her labs were to be fasting today and did not eat anything or medication this morning. She is concerned her blood sugar is low as she is sleepy and has dry mouth. She was given crackers and juice. She notes when she checks her BG in the morning it will sometimes be high. She uses sliding scale insulin Lantus and Novolin.  She spoke with Dr [name] last week. She is still agreeable with proceeding with chemotherapy before surgery. She did complete chemo class. She is ready to proceed with chemo today. She notes her PAC placement yesterday went well but was very tired afterward. She notes mild bleeding at her PAC site last night after procedure. Her son notes his concern with how she will tolerate chemo although he initially was for chemo. The patient notes her concerns as well. She is overall ready to try chemo full force, but if she cannot handle it she would like to proceed with surgery sooner if eligible.   Today she notes abdominal pain in umbilical region which has been going on for a few months after starting Jardiance. She notes this occur before her BM and will go away after her BM. She has antiemetics but no PPI.    REVIEW OF SYSTEMS:   Constitutional: Denies  fevers, chills or abnormal weight loss (+)Fatigue  Eyes: Denies blurriness of vision Ears, nose, mouth, throat, and face: Denies mucositis or sore throat Respiratory: Denies cough, dyspnea or wheezes Cardiovascular: Denies palpitation, chest discomfort or lower extremity swelling Gastrointestinal:  Denies nausea, heartburn or change in bowel habits (+) Abdominal pain with BM  Skin: Denies  abnormal skin rashes Lymphatics: Denies new lymphadenopathy or easy bruising Neurological:Denies numbness, tingling or new weaknesses Behavioral/Psych: Mood is stable, no new changes  All other systems were reviewed with the patient and are negative.  MEDICAL HISTORY:  Past Medical History:  Diagnosis Date  . Anxiety   . Breast cancer ([place])   . CHF (congestive heart failure) ([place])   . Diabetes mellitus without complication ([place])    type 2  . Hypertension     SURGICAL HISTORY: Past Surgical History:  Procedure Laterality Date  . APPENDECTOMY    . CHOLECYSTECTOMY    . COLONOSCOPY    . EYE SURGERY     cataracts  . MULTIPLE TOOTH EXTRACTIONS    . PORTACATH PLACEMENT N/A 08/04/2020   Procedure: INSERTION PORT-A-CATH WITH ULTRASOUND GUIDANCE;  Surgeon: [name], MD;  Location: [facility];  Service: General;  Laterality: N/A;  . STOMACH SURGERY      I have reviewed the social history and family history with the patient and they are unchanged from previous note.  ALLERGIES:  is allergic to hydrochlorothiazide, lisinopril, and coconut oil.  MEDICATIONS:  Current Outpatient Medications  Medication Sig Dispense Refill  . amLODipine (NORVASC) 10 MG tablet Take 10 mg by mouth daily.    Marland Kitchen aspirin 81 MG chewable tablet Chew 81 mg by mouth daily.    . Cholecalciferol 25 MCG (1000 UT) capsule Take 1,000 Units by mouth daily.    . diphenoxylate-atropine (LOMOTIL) 2.5-0.025 MG tablet Take 1-2 tablets by mouth 4 (four) times daily as needed for diarrhea or loose stools. 30 tablet 0  . DULoxetine (CYMBALTA) 30 MG capsule Take 30 mg by mouth daily.    . empagliflozin (JARDIANCE) 25 MG TABS tablet Take 25 mg by mouth daily.    . fluorometholone (FML) 0.1 % ophthalmic suspension Place 1 drop into both eyes daily.    . folic acid (FOLVITE) 1 MG tablet Take 1 tablet by mouth daily.    . furosemide (LASIX) 40 MG tablet Take 40 mg by mouth daily.    Marland Kitchen gabapentin (NEURONTIN) 400 MG capsule Take  400 mg by mouth 3 (three) times daily.     Marland Kitchen glipiZIDE (GLUCOTROL XL) 5 MG 24 hr tablet Take 5 mg by mouth daily with breakfast.     . HYDROcodone-acetaminophen (NORCO/VICODIN) 5-325 MG tablet Take 1 tablet by mouth every 6 (six) hours as needed for moderate pain. 15 tablet 0  . insulin glargine (LANTUS SOLOSTAR) 100 UNIT/ML Solostar Pen Inject 70 Units into the skin 2 (two) times daily.     . insulin regular (NOVOLIN R) 100 units/mL injection Inject 0-22 Units into the skin 3 (three) times daily before meals. Dose per sliding scale    . lidocaine-prilocaine (EMLA) cream Apply to affected area once (Patient taking differently: Apply 1 application topically as needed (prior to portacath access). ) 30 g 3  . losartan (COZAAR) 100 MG tablet Take 100 mg by mouth daily.    . metFORMIN (GLUCOPHAGE) 1000 MG tablet Take 1,000 mg by mouth daily with supper.     . metFORMIN (GLUCOPHAGE) 500 MG tablet Take 500 mg by mouth daily with breakfast.    .  metoprolol tartrate (LOPRESSOR) 50 MG tablet Take 50 mg by mouth in the morning and at bedtime.    . Omega-3 Fatty Acids (FISH OIL) 1000 MG CAPS Take 1,000 mg by mouth daily.    . ondansetron (ZOFRAN) 8 MG tablet Take 1 tablet (8 mg total) by mouth 2 (two) times daily as needed for refractory nausea / vomiting. 30 tablet 1  . ondansetron (ZOFRAN-ODT) 4 MG disintegrating tablet Take 4 mg by mouth 2 (two) times daily as needed for nausea or vomiting.     . potassium chloride (KLOR-CON) 10 MEQ tablet Take 20 mEq by mouth daily. DAILY WITH LASIX    . pregabalin (LYRICA) 75 MG capsule Take 75 mg by mouth at bedtime.     . prochlorperazine (COMPAZINE) 10 MG tablet Take 1 tablet (10 mg total) by mouth every 6 (six) hours as needed (Nausea or vomiting). 30 tablet 1  . RESTASIS 0.05 % ophthalmic emulsion Place 1 drop into both eyes 2 (two) times daily.    . rosuvastatin (CRESTOR) 5 MG tablet Take 5 mg by mouth at bedtime.    Marland Kitchen tiZANidine (ZANAFLEX) 4 MG tablet Take 4 mg by  mouth 2 (two) times daily as needed for muscle spasms.     . vitamin E (VITAMIN E) 180 MG (400 UNITS) capsule Take 400 Units by mouth daily.     No current facility-administered medications for this visit.   Facility-Administered Medications Ordered in Other Visits  Medication Dose Route Frequency Provider Last Rate Last Admin  . sodium chloride flush (NS) 0.9 % injection 10 mL  10 mL Intracatheter PRN [name], MD   10 mL at 08/05/20 1708  . technetium medronate (TC-MDP) injection 20 millicurie  20 millicurie Intravenous Once PRN Macy Mis, MD        PHYSICAL EXAMINATION: ECOG PERFORMANCE STATUS: 2 - Symptomatic, <50% confined to bed  Vitals:   08/05/20 0845  BP: 121/65  Pulse: 65  Resp: 18  Temp: 97.7 F (36.5 C)  SpO2: 97%   Filed Weights   08/05/20 0845  Weight: 218 lb 12.8 oz (99.2 kg)    Due to COVID19 we will limit examination to appearance. Patient had no complaints.  GENERAL:alert, no distress and comfortable SKIN: skin color normal, no rashes or significant lesions EYES: normal, Conjunctiva are pink and non-injected, sclera clear  NEURO: alert & oriented x 3 with fluent speech   LABORATORY DATA:  I have reviewed the data as listed CBC Latest Ref Rng & Units 08/05/2020 07/22/2020  WBC 4.0 - 10.5 K/uL 5.6 6.7  Hemoglobin 12.0 - 15.0 g/dL 11.3(L) 13.3  Hematocrit 36 - 46 % 35.1(L) 40.6  Platelets 150 - 400 K/uL 224 280     CMP Latest Ref Rng & Units 08/05/2020 07/22/2020  Glucose 70 - 99 mg/dL 330(H) 435(H)  BUN 8 - 23 mg/dL 17 17  Creatinine 0.44 - 1.00 mg/dL 1.01(H) 1.16(H)  Sodium 135 - 145 mmol/L 139 139  Potassium 3.5 - 5.1 mmol/L 4.1 4.2  Chloride 98 - 111 mmol/L 106 102  CO2 22 - 32 mmol/L 29 29  Calcium 8.9 - 10.3 mg/dL 9.6 10.7(H)  Total Protein 6.5 - 8.1 g/dL 6.1(L) 7.3  Total Bilirubin 0.3 - 1.2 mg/dL 0.3 0.3  Alkaline Phos 38 - 126 U/L 67 100  AST 15 - 41 U/L 10(L) 11(L)  ALT 0 - 44 U/L 11 19      RADIOGRAPHIC STUDIES: I have  personally reviewed the radiological images as  listed and agreed with the findings in the report. DG CHEST PORT 1 VIEW  Result Date: 08/04/2020 CLINICAL DATA:  Central line placement. EXAM: PORTABLE CHEST 1 VIEW COMPARISON:  None. FINDINGS: Right IJ approach Port-A-Cath with the tip projecting at the right atrium. No visible pneumothorax on this limited semi erect radiograph. Vascular congestion without overt pulmonary edema. No confluent consolidation. No visible pleural effusions. Enlarged cardiac silhouette. No acute osseous abnormality. IMPRESSION: 1. Right IJ approach Port-A-Cath with the tip projecting at the right atrium. 2. Cardiomegaly and central vascular congestion without overt pulmonary edema. Electronically Signed   By: Margaretha Sheffield MD   On: 08/04/2020 13:18   DG Fluoro Guide CV Line-No Report  Result Date: 08/04/2020 Fluoroscopy was utilized by the requesting physician.  No radiographic interpretation.     ASSESSMENT & PLAN:  Cheryl Blankenship is a 69 y.o. female with    1. Malignant neoplasm of upper-outer quadrant of left breast, Stage II, c(T2N1M0), ER+/PR+/HER2+, Grade II  -She was diagnosed in 06/2020. She has 12cm calcification and 4.5cm mass in her left breast with 2 abnormal LN. 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  -I discussed her MRI Breast from 07/31/20 shows very extensive disease in her left breast and 3 abnormal LNs in the left axilla. There is Indeterminate enhancing 4m mass within the upper inner right breast. I personally reviewed with patient and family today and discussed MRI is more sensitive imaging.  -I recommend right breast biopsy to evaluate for contralateral breast cancer. She is agreeable. I discussed if her right breast mass is cancerous it is likely a second separate breast cancer.  -I also discussed her bone scan from 08/03/20 which shows uptake in  her lumbar spine, but nonspecific. She has known lumbar back pain from arthritis. She plans to proceed with CT CAP on 08/07/20 and DEXA on 08/12/20.  -Given the extensive disease in her left breast, she will need mastectomy with Dr TGeorgette Doverwhich is the only way to cure her cancer. She is agreeable with that.  -I recommended neoadjuvant chemo and she opted to proceed with THP q3weeks for 4-6 cycles to shrink tumor before surgery and reduce her high risk of recurrence. Plan to start today (08/05/20). Due to her medical comorbidities I will reduce docetaxel dose to 60 mg/m for first cycle, to see how she tolerates.  -The patient and her son notes concerns with chemo. We had lengthy discussion about possible side effects, possible prognosis and risk and benefit of chemo. She notes she is overall ready to try chemo, but if she cannot handle it she would like to back off chemo (I will likely change to Kadcyla) or proceed with surgery sooner.  -echo reviewed, adequate for treatment, will refer her to cardiology  -Labs reviewed, Hg 11.3, BG 330, Cr 1.01, protein 6.1, albumin 3.2. Overall adequate to proceed with first cycle THP today at reduced dose. I reviewed side effects to watch for. I reviewed antiemetic use and antidiarrheal use with her.  -I discussed if she does well, will consider adding low dose carboplatin. If she does not tolerate well, will change her treatment to Kadcyla.  -f/u next week for toxicity check.   2. Genetic testing  -Given her mild family history of cancer (mother with Ovarian, niece and 2 cousins with cancer) she is eligible for genetic testing. She is agreeable to proceed.   3. Comorbidities: CHF, poorly  controlled DM with neuropathy, HTN, Arthritis in hips, Elevated Cr  -She has had recent started Jardiance which has lead to nausea and stomach pain. She takes Zofran as needed.  -Continue medications and f/u with PCP and Cardiologist Dr. Marisue Ivan.  -She had 2 falls in 2021. Workup  indicated falls were related to multiple medications and poor sleep. She ambulates with cane.  -Pt is on Lasix which she notes leads to urinary incontinence. She wears depends for this. -Her baseline ECHO from 07/31/20 was normal. Will monitor on Herceptin.  -Pt thought labs today were fasting and did not eat or take medication. She felt weak and lethargic so we gave her crackers and juice. BG 330 today (08/05/20). Will give Insulin in clinic today. I reviewed her insulin sliding scale with her.  -I encouraged her to eat and drink regularly through the day even with lower appetite. She can use Glucerna as well. Will give prescription and sample as needed.  -I will refer her to dietician.  4. Social Support  -She does have decreased memory, she is on medication. Her family help her with history.  -She lives in [place] alone in an apartment with other older women. She is currently able to take care of herself independently. Her daughter is in town and her son is in [place]. She has other non-biological children.  -She plan to have care given at home soon. She has Medicare and Medicaid.    PLAN:  -Send Urgent dietician referral, Desmond Lope will see her today  -I called in Lomotil today  -CT CAP on 11/19 scheduled  -Right MRI guided breast biopsy next week.  -Labs reviewed and adequate to proceed with C1 THP today at reduced dose of docetaxel to 75m/m2 to see how she tolerates.  -Lab and F/u next week for toxicity check   No problem-specific Assessment & Plan notes found for this encounter.   Orders Placed This Encounter  Procedures  . MR RT BREAST BX W LOC DEV 1ST LESION IMAGE BX SPEC MR GUIDE    Standing Status:   Future    Standing Expiration Date:   08/05/2021    Order Specific Question:   Reason for Exam (SYMPTOM  OR DIAGNOSIS REQUIRED)    Answer:   biopsy of right breast lesion on MRI    Order Specific Question:   Preferred Imaging Location?    Answer:   [id number] (table  limit-550lbs)  . Ambulatory referral to Cardiology    Referral Priority:   Routine    Referral Type:   Consultation    Referral Reason:   Specialty Services Required    Requested Specialty:   Cardiology    Number of Visits Requested:   1   Pt had her son had different opinion about chemo, and we had very lengthy discussion. All questions were answered. The patient knows to call the clinic with any problems, questions or concerns. No barriers to learning was detected. The total time spent in the appointment was 60 minutes.     YTruitt Merle MD 08/05/2020   I, AJoslyn Devon am acting as scribe for YTruitt Merle MD.   I have reviewed the above documentation for accuracy and completeness, and I agree with the above.

## 2020-08-03 ENCOUNTER — Other Ambulatory Visit: Payer: Self-pay | Admitting: Surgery

## 2020-08-03 ENCOUNTER — Encounter: Payer: Self-pay | Admitting: *Deleted

## 2020-08-03 ENCOUNTER — Other Ambulatory Visit: Payer: Self-pay

## 2020-08-03 ENCOUNTER — Inpatient Hospital Stay (HOSPITAL_COMMUNITY)
Admission: RE | Admit: 2020-08-03 | Discharge: 2020-08-03 | Disposition: A | Discharge status: Home / Self Care | Payer: Medicare HMO | Source: Ambulatory Visit

## 2020-08-03 ENCOUNTER — Other Ambulatory Visit (HOSPITAL_COMMUNITY)
Admission: RE | Admit: 2020-08-03 | Discharge: 2020-08-03 | Disposition: A | Discharge status: Home / Self Care | Payer: Medicare HMO | Source: Ambulatory Visit | Attending: Surgery | Admitting: Surgery

## 2020-08-03 ENCOUNTER — Encounter (HOSPITAL_COMMUNITY): Payer: Self-pay

## 2020-08-03 ENCOUNTER — Encounter (HOSPITAL_COMMUNITY)
Admission: RE | Admit: 2020-08-03 | Discharge: 2020-08-03 | Disposition: A | Discharge status: Home / Self Care | Payer: Medicare HMO | Source: Ambulatory Visit | Attending: Hematology | Admitting: Hematology

## 2020-08-03 DIAGNOSIS — Z20822 Contact with and (suspected) exposure to covid-19: Secondary | ICD-10-CM | POA: Insufficient documentation

## 2020-08-03 DIAGNOSIS — Z01818 Encounter for other preprocedural examination: Secondary | ICD-10-CM | POA: Insufficient documentation

## 2020-08-03 DIAGNOSIS — Z170 Estrogen receptor positive status [ER+]: Secondary | ICD-10-CM | POA: Diagnosis not present

## 2020-08-03 DIAGNOSIS — C50412 Malignant neoplasm of upper-outer quadrant of left female breast: Secondary | ICD-10-CM

## 2020-08-03 LAB — SARS CORONAVIRUS 2 (TAT 6-24 HRS): SARS Coronavirus 2: NEGATIVE

## 2020-08-03 IMAGING — NM NM BONE WHOLE BODY
2 series · 2 of 2 positions shown · non-contrast
Comparison: None.

CLINICAL DATA: Breast carcinoma

EXAM:
NUCLEAR MEDICINE WHOLE BODY BONE SCAN
TECHNIQUE: Whole body anterior and posterior images were obtained approximately
3 hours after intravenous injection of radiopharmaceutical.
RADIOPHARMACEUTICALS:  20.0 mCi [WN] MDP IV

[Series 1: whole body · 2.66mm/px · 1 of 1 slices shown (1 of 2)]
[im 1/1]
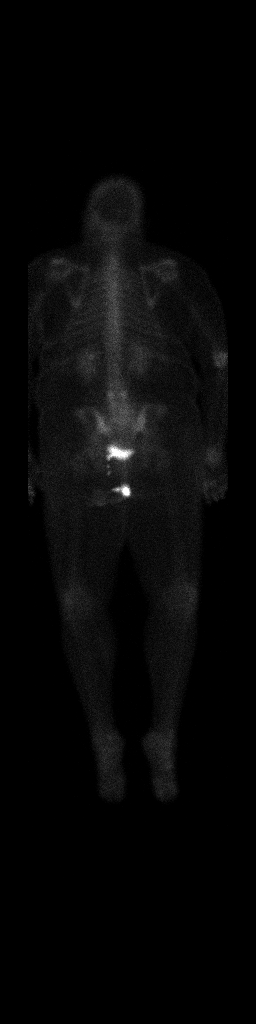

[Series 1: whole body · 2.66mm/px · 1 of 1 slices shown (2 of 2)]
[im 1/1]
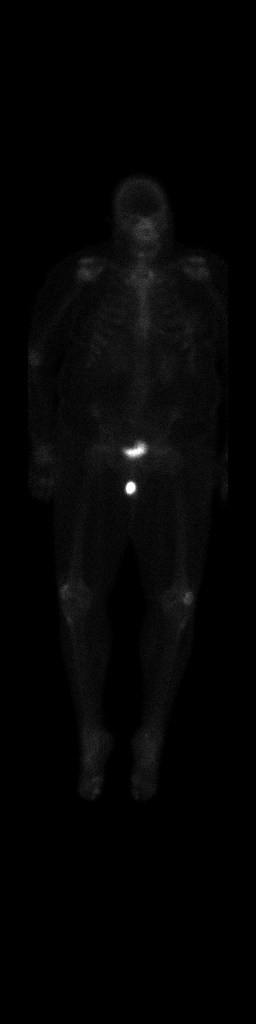

[2 of 2 positions shown; findings below may reference images not displayed]

FINDINGS: Foci of increased radiotracer uptake in the mid to lower lumbar
spine is of uncertain etiology. Increased uptake in the shoulders,
elbows, wrists, and knees may well have arthropathic etiology.
Elsewhere, the distribution of uptake is unremarkable. Kidneys are
noted in the flank positions bilaterally. Increased uptake in the
labial regions likely is due to leakage from urinary bladder.
Increased uptake in the mid face is likely due to paranasal sinus
disease.
IMPRESSION: Increased radiotracer uptake in the mid to lower lumbar regions of
uncertain etiology. Correlation with radiography to assess for
potential arthropathy in these areas advised. Increased uptake in
major joints is likely of arthropathic etiology. Increased uptake in
the mid face is likely due to paranasal sinus disease. Distribution
of radiotracer uptake in bony structures elsewhere unremarkable.

## 2020-08-03 MED ORDER — TECHNETIUM TC 99M MEDRONATE IV KIT: 20.0000 | PACK | Freq: Once | INTRAVENOUS | Status: DC | PRN

## 2020-08-03 NOTE — Progress Notes (Signed)
[facility] - [place], [place] - [address] [place] [address] Phone: [phone] Fax: [phone]      Your procedure is scheduled on November 16  Report to [facility] Main Entrance "A" at 0930 A.M., and check in at the Admitting office.  Call this number if you have problems the morning of surgery:  [phone]  Call [phone] if you have any questions prior to your surgery date Monday-Friday 8am-4pm    Remember:  Do not eat  Or drink after midnight the night before your surgery    Take these medicines the morning of surgery with A SIP OF WATER  amLODipine (NORVASC) DULoxetine (CYMBALTA) Eye drops if needed gabapentin (NEURONTIN metoprolol tartrate (LOPRESSOR) tiZANidine (ZANAFLEX)  If needed  Follow your surgeon's instructions on when to stop Aspirin.  If no instructions were given by your surgeon then you will need to call the office to get those instructions.    As of today, STOP taking any Aspirin (unless otherwise instructed by your surgeon) Aleve, Naproxen, Ibuprofen, Motrin, Advil, Goody's, BC's, all herbal medications, fish oil, and all vitamins.   WHAT DO I DO ABOUT MY DIABETES MEDICATION?   Marland Kitchen Do not take oral diabetes medicines (pills) the morning of surgery. empagliflozin (JARDIANCE), glipiZIDE (GLUCOTROL XL), metFORMIN (GLUCOPHAGE)  Take insulin regular (NOVOLIN R) the day before surgery as usual but do not take a bedtime dose  . THE NIGHT BEFORE SURGERY, take _____35______ units of __insulin glargine (LANTUS SOLOSTAR) _________insulin.       . THE MORNING OF SURGERY, take ______35_______ units of ___insulin glargine (LANTUS SOLOSTAR) _______insulin.  . The day of surgery, do not take other diabetes injectables, including Byetta (exenatide), Bydureon (exenatide ER), Victoza (liraglutide), or Trulicity (dulaglutide).  . If your CBG is greater than 220 mg/dL, you may take  of your sliding scale (correction) dose of  insulin.   HOW TO MANAGE YOUR DIABETES BEFORE AND AFTER SURGERY  Why is it important to control my blood sugar before and after surgery? . Improving blood sugar levels before and after surgery helps healing and can limit problems. . A way of improving blood sugar control is eating a healthy diet by: o  Eating less sugar and carbohydrates o  Increasing activity/exercise o  Talking with your doctor about reaching your blood sugar goals . High blood sugars (greater than 180 mg/dL) can raise your risk of infections and slow your recovery, so you will need to focus on controlling your diabetes during the weeks before surgery. . Make sure that the doctor who takes care of your diabetes knows about your planned surgery including the date and location.  How do I manage my blood sugar before surgery? . Check your blood sugar at least 4 times a day, starting 2 days before surgery, to make sure that the level is not too high or low. . Check your blood sugar the morning of your surgery when you wake up and every 2 hours until you get to the Short Stay unit. o If your blood sugar is less than 70 mg/dL, you will need to treat for low blood sugar: - Do not take insulin. - Treat a low blood sugar (less than 70 mg/dL) with  cup of clear juice (cranberry or apple), 4 glucose tablets, OR glucose gel. - Recheck blood sugar in 15 minutes after treatment (to make sure it is greater than 70 mg/dL). If your blood sugar is not greater than 70 mg/dL on recheck,  call [phone] for further instructions. . Report your blood sugar to the short stay nurse when you get to Short Stay.  . If you are admitted to the hospital after surgery: o Your blood sugar will be checked by the staff and you will probably be given insulin after surgery (instead of oral diabetes medicines) to make sure you have good blood sugar levels. o The goal for blood sugar control after surgery is 80-180 mg/dL                      Do not wear  jewelry, make up, or nail polish            Do not wear lotions, powders, perfumes/colognes, or deodorant.            Do not shave 48 hours prior to surgery.  Men may shave face and neck.            Do not bring valuables to the hospital.            [facility] is not responsible for any belongings or valuables.  Do NOT Smoke (Tobacco/Vaping) or drink Alcohol 24 hours prior to your procedure If you use a CPAP at night, you may bring all equipment for your overnight stay.   Contacts, glasses, dentures or bridgework may not be worn into surgery.      For patients admitted to the hospital, discharge time will be determined by your treatment team.   Patients discharged the day of surgery will not be allowed to drive home, and someone needs to stay with them for 24 hours.    Special instructions:   [facility]- Preparing For Surgery  Before surgery, you can play an important role. Because skin is not sterile, your skin needs to be as free of germs as possible. You can reduce the number of germs on your skin by washing with CHG (chlorahexidine gluconate) Soap before surgery.  CHG is an antiseptic cleaner which kills germs and bonds with the skin to continue killing germs even after washing.    Oral Hygiene is also important to reduce your risk of infection.  Remember - BRUSH YOUR TEETH THE MORNING OF SURGERY WITH YOUR REGULAR TOOTHPASTE  Please do not use if you have an allergy to CHG or antibacterial soaps. If your skin becomes reddened/irritated stop using the CHG.  Do not shave (including legs and underarms) for at least 48 hours prior to first CHG shower. It is OK to shave your face.  Please follow these instructions carefully.   1. Shower the NIGHT BEFORE SURGERY and the MORNING OF SURGERY with CHG Soap.   2. If you chose to wash your hair, wash your hair first as usual with your normal shampoo.  3. After you shampoo, rinse your hair and body thoroughly to remove the  shampoo.  4. Use CHG as you would any other liquid soap. You can apply CHG directly to the skin and wash gently with a scrungie or a clean washcloth.   5. Apply the CHG Soap to your body ONLY FROM THE NECK DOWN.  Do not use on open wounds or open sores. Avoid contact with your eyes, ears, mouth and genitals (private parts). Wash Face and genitals (private parts)  with your normal soap.   6. Wash thoroughly, paying special attention to the area where your surgery will be performed.  7. Thoroughly rinse your body with warm water from the neck down.  8.  DO NOT shower/wash with your normal soap after using and rinsing off the CHG Soap.  9. Pat yourself dry with a CLEAN TOWEL.  10. Wear CLEAN PAJAMAS to bed the night before surgery  11. Place CLEAN SHEETS on your bed the night of your first shower and DO NOT SLEEP WITH PETS.   Day of Surgery: Wear Clean/Comfortable clothing the morning of surgery Do not apply any deodorants/lotions.   Remember to brush your teeth WITH YOUR REGULAR TOOTHPASTE.   Please read over the following fact sheets that you were given.

## 2020-08-03 NOTE — Progress Notes (Signed)
Patient was unable to come for PAT appointment today because there was some scheduling conflict with her appointments at [facility] and here.  Her appointments were scheduled too close together.    Patient was assessed as a same day workup for surgery with Dr. [name] tomorrow  Patient is type 2 diabetic.  Patient was at the [facility] testing site when I called about appointment, PAT instructions were able to be printed off at the testing site and given to the patient.  Patient instructed to arrive at 0900am tomorrow.    Spoke with Karoline Caldwell PA-C about patient for anesthesia review.  Patient will need labs DOS last A1C was July in care everywhere elevated at 11.5.

## 2020-08-04 ENCOUNTER — Encounter (HOSPITAL_COMMUNITY)
Admission: RE | Disposition: A | Discharge status: Home / Self Care | Payer: Self-pay | Source: Home / Self Care | Attending: Surgery

## 2020-08-04 ENCOUNTER — Encounter (HOSPITAL_COMMUNITY): Payer: Medicare HMO

## 2020-08-04 ENCOUNTER — Other Ambulatory Visit: Payer: Self-pay | Admitting: Hematology

## 2020-08-04 ENCOUNTER — Ambulatory Visit (HOSPITAL_COMMUNITY): Payer: Medicare HMO | Admitting: Physician Assistant

## 2020-08-04 ENCOUNTER — Ambulatory Visit (HOSPITAL_COMMUNITY)
Admission: RE | Admit: 2020-08-04 | Discharge: 2020-08-04 | Disposition: A | Discharge status: Home / Self Care | Payer: Medicare HMO | Attending: Surgery | Admitting: Surgery

## 2020-08-04 ENCOUNTER — Other Ambulatory Visit (HOSPITAL_COMMUNITY): Payer: Medicare HMO

## 2020-08-04 ENCOUNTER — Encounter (HOSPITAL_COMMUNITY): Payer: Self-pay | Admitting: Surgery

## 2020-08-04 ENCOUNTER — Ambulatory Visit (HOSPITAL_COMMUNITY): Payer: Medicare HMO

## 2020-08-04 ENCOUNTER — Other Ambulatory Visit: Payer: Medicare HMO

## 2020-08-04 DIAGNOSIS — Z419 Encounter for procedure for purposes other than remedying health state, unspecified: Secondary | ICD-10-CM

## 2020-08-04 DIAGNOSIS — C773 Secondary and unspecified malignant neoplasm of axilla and upper limb lymph nodes: Secondary | ICD-10-CM | POA: Insufficient documentation

## 2020-08-04 DIAGNOSIS — Z87891 Personal history of nicotine dependence: Secondary | ICD-10-CM | POA: Diagnosis not present

## 2020-08-04 DIAGNOSIS — C50412 Malignant neoplasm of upper-outer quadrant of left female breast: Secondary | ICD-10-CM | POA: Diagnosis present

## 2020-08-04 DIAGNOSIS — Z888 Allergy status to other drugs, medicaments and biological substances status: Secondary | ICD-10-CM | POA: Insufficient documentation

## 2020-08-04 DIAGNOSIS — Z452 Encounter for adjustment and management of vascular access device: Secondary | ICD-10-CM

## 2020-08-04 DIAGNOSIS — Z170 Estrogen receptor positive status [ER+]: Secondary | ICD-10-CM

## 2020-08-04 HISTORY — PX: PORTACATH PLACEMENT: SHX2246

## 2020-08-04 LAB — GLUCOSE, CAPILLARY
Glucose-Capillary: 163 mg/dL — ABNORMAL HIGH (ref 70–99)
Glucose-Capillary: 157 mg/dL — ABNORMAL HIGH (ref 70–99)

## 2020-08-04 IMAGING — DX DG CHEST 1V PORT
1 series · 1 of 1 positions shown · non-contrast
Comparison: None.

CLINICAL DATA: Central line placement.

EXAM:
PORTABLE CHEST 1 VIEW

[chest]
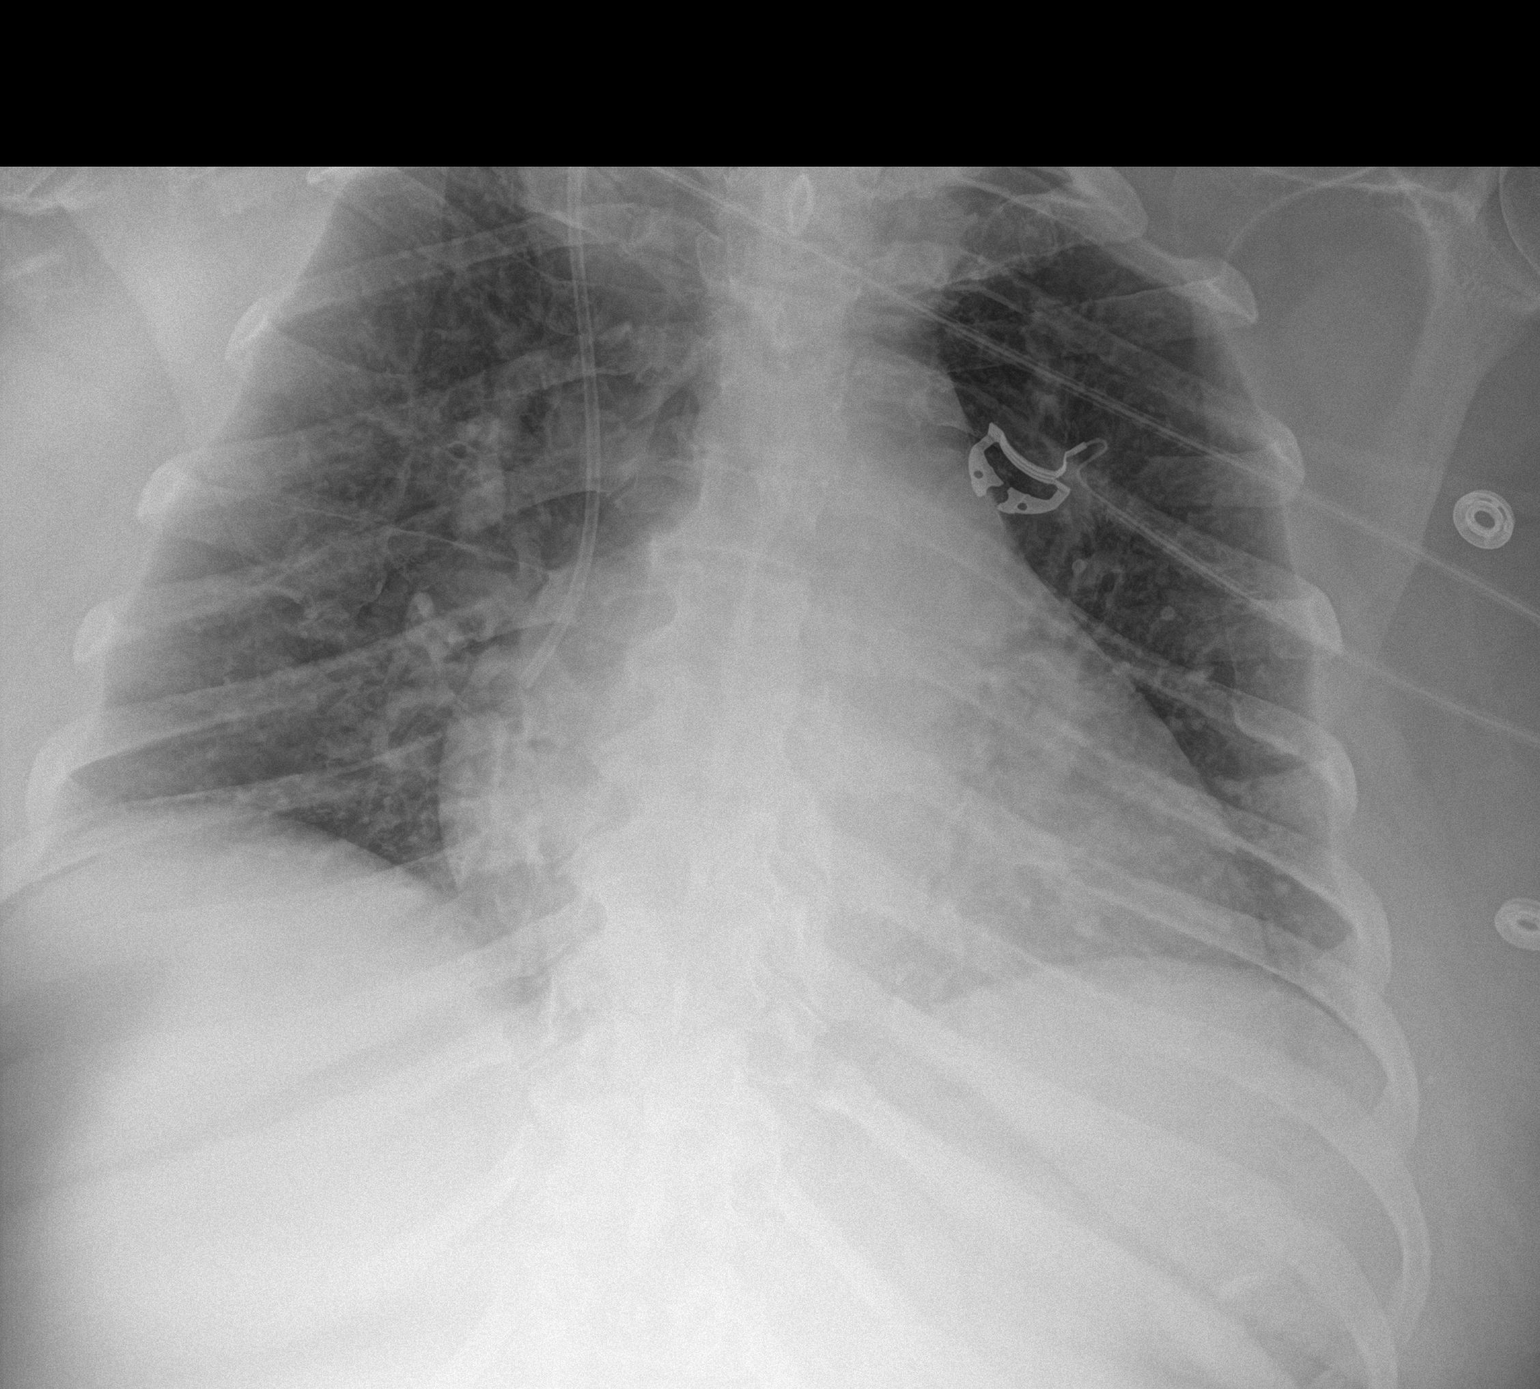

[1 of 1 positions shown; findings below may reference images not displayed]

FINDINGS: Right IJ approach Port-A-Cath with the tip projecting at the right
atrium. No visible pneumothorax on this limited semi erect
radiograph. Vascular congestion without overt pulmonary edema. No
confluent consolidation. No visible pleural effusions. Enlarged
cardiac silhouette. No acute osseous abnormality.
IMPRESSION: 1. Right IJ approach Port-A-Cath with the tip projecting at the
right atrium.
2. Cardiomegaly and central vascular congestion without overt
pulmonary edema.

## 2020-08-04 SURGERY — INSERTION, TUNNELED CENTRAL VENOUS DEVICE, WITH PORT
Anesthesia: General | Site: Breast

## 2020-08-04 MED ORDER — FENTANYL CITRATE (PF) 100 MCG/2ML IJ SOLN
INTRAMUSCULAR | Status: AC
  Filled 2020-08-04: qty 2

## 2020-08-04 MED ORDER — LIDOCAINE 2% (20 MG/ML) 5 ML SYRINGE
INTRAMUSCULAR | Status: DC | PRN
  Administered 2020-08-04: 60 mg via INTRAVENOUS

## 2020-08-04 MED ORDER — SODIUM CHLORIDE 0.9 % IV SOLN
INTRAVENOUS | Status: AC
  Filled 2020-08-04: qty 1.2

## 2020-08-04 MED ORDER — FENTANYL CITRATE (PF) 100 MCG/2ML IJ SOLN
25.0000 ug | INTRAMUSCULAR | Status: DC | PRN
  Administered 2020-08-04: 25 ug via INTRAVENOUS

## 2020-08-04 MED ORDER — CHLORHEXIDINE GLUCONATE CLOTH 2 % EX PADS: 6.0000 | MEDICATED_PAD | Freq: Once | CUTANEOUS | Status: DC

## 2020-08-04 MED ORDER — MIDAZOLAM HCL 5 MG/5ML IJ SOLN
INTRAMUSCULAR | Status: DC | PRN
  Administered 2020-08-04: 2 mg via INTRAVENOUS

## 2020-08-04 MED ORDER — EPHEDRINE SULFATE-NACL 50-0.9 MG/10ML-% IV SOSY
PREFILLED_SYRINGE | INTRAVENOUS | Status: DC | PRN
  Administered 2020-08-04 (×5): 10 mg via INTRAVENOUS

## 2020-08-04 MED ORDER — BUPIVACAINE HCL (PF) 0.25 % IJ SOLN
INTRAMUSCULAR | Status: AC
  Filled 2020-08-04: qty 30

## 2020-08-04 MED ORDER — MEPERIDINE HCL 25 MG/ML IJ SOLN: 6.2500 mg | INTRAMUSCULAR | Status: DC | PRN

## 2020-08-04 MED ORDER — CEFAZOLIN SODIUM-DEXTROSE 2-4 GM/100ML-% IV SOLN
2.0000 g | INTRAVENOUS | Status: AC
  Administered 2020-08-04: 2 g via INTRAVENOUS
  Filled 2020-08-04: qty 100

## 2020-08-04 MED ORDER — HEPARIN SOD (PORK) LOCK FLUSH 100 UNIT/ML IV SOLN
INTRAVENOUS | Status: AC
  Filled 2020-08-04: qty 5

## 2020-08-04 MED ORDER — PROMETHAZINE HCL 25 MG/ML IJ SOLN
INTRAMUSCULAR | Status: AC
  Filled 2020-08-04: qty 1

## 2020-08-04 MED ORDER — SODIUM CHLORIDE 0.9 % IV SOLN: INTRAVENOUS | Status: DC | PRN

## 2020-08-04 MED ORDER — ACETAMINOPHEN 500 MG PO TABS: 1000.0000 mg | ORAL_TABLET | Freq: Once | ORAL | Status: DC

## 2020-08-04 MED ORDER — MIDAZOLAM HCL 2 MG/2ML IJ SOLN
INTRAMUSCULAR | Status: AC
  Filled 2020-08-04: qty 2

## 2020-08-04 MED ORDER — PROPOFOL 10 MG/ML IV BOLUS
INTRAVENOUS | Status: DC | PRN
  Administered 2020-08-04: 110 mg via INTRAVENOUS

## 2020-08-04 MED ORDER — LACTATED RINGERS IV SOLN: INTRAVENOUS | Status: DC

## 2020-08-04 MED ORDER — PHENYLEPHRINE 40 MCG/ML (10ML) SYRINGE FOR IV PUSH (FOR BLOOD PRESSURE SUPPORT)
PREFILLED_SYRINGE | INTRAVENOUS | Status: DC | PRN
  Administered 2020-08-04: 80 ug via INTRAVENOUS
  Administered 2020-08-04 (×2): 120 ug via INTRAVENOUS
  Administered 2020-08-04: 40 ug via INTRAVENOUS
  Administered 2020-08-04: 80 ug via INTRAVENOUS
  Administered 2020-08-04: 40 ug via INTRAVENOUS
  Administered 2020-08-04 (×3): 80 ug via INTRAVENOUS
  Administered 2020-08-04: 40 ug via INTRAVENOUS

## 2020-08-04 MED ORDER — ONDANSETRON HCL 4 MG/2ML IJ SOLN
INTRAMUSCULAR | Status: DC | PRN
  Administered 2020-08-04: 4 mg via INTRAVENOUS

## 2020-08-04 MED ORDER — 0.9 % SODIUM CHLORIDE (POUR BTL) OPTIME
TOPICAL | Status: DC | PRN
  Administered 2020-08-04: 1000 mL

## 2020-08-04 MED ORDER — HYDROCODONE-ACETAMINOPHEN 5-325 MG PO TABS: 1.0000 | ORAL_TABLET | Freq: Four times a day (QID) | ORAL | 0 refills | Status: DC | PRN

## 2020-08-04 MED ORDER — PROMETHAZINE HCL 25 MG/ML IJ SOLN
6.2500 mg | INTRAMUSCULAR | Status: DC | PRN
  Administered 2020-08-04: 6.25 mg via INTRAVENOUS

## 2020-08-04 MED ORDER — PHENYLEPHRINE 40 MCG/ML (10ML) SYRINGE FOR IV PUSH (FOR BLOOD PRESSURE SUPPORT)
PREFILLED_SYRINGE | INTRAVENOUS | Status: AC
  Filled 2020-08-04: qty 20

## 2020-08-04 MED ORDER — BUPIVACAINE HCL 0.25 % IJ SOLN
INTRAMUSCULAR | Status: DC | PRN
  Administered 2020-08-04: 9 mL

## 2020-08-04 MED ORDER — PROPOFOL 10 MG/ML IV BOLUS
INTRAVENOUS | Status: AC
  Filled 2020-08-04: qty 20

## 2020-08-04 MED ORDER — HEPARIN SOD (PORK) LOCK FLUSH 100 UNIT/ML IV SOLN
INTRAVENOUS | Status: DC | PRN
  Administered 2020-08-04: 450 [IU] via INTRAVENOUS

## 2020-08-04 MED ORDER — CHLORHEXIDINE GLUCONATE 0.12 % MT SOLN
15.0000 mL | Freq: Once | OROMUCOSAL | Status: AC
  Administered 2020-08-04: 15 mL via OROMUCOSAL
  Filled 2020-08-04 (×2): qty 15

## 2020-08-04 MED ORDER — FENTANYL CITRATE (PF) 250 MCG/5ML IJ SOLN
INTRAMUSCULAR | Status: AC
  Filled 2020-08-04: qty 5

## 2020-08-04 SURGICAL SUPPLY — 43 items
APL SKNCLS STERI-STRIP NONHPOA (GAUZE/BANDAGES/DRESSINGS) ×1
BAG DECANTER FOR FLEXI CONT (MISCELLANEOUS) ×2 IMPLANT
BENZOIN TINCTURE PRP APPL 2/3 (GAUZE/BANDAGES/DRESSINGS) ×2 IMPLANT
CHLORAPREP W/TINT 10.5 ML (MISCELLANEOUS) ×2 IMPLANT
COVER SURGICAL LIGHT HANDLE (MISCELLANEOUS) ×2 IMPLANT
COVER TRANSDUCER ULTRASND GEL (DISPOSABLE) IMPLANT
COVER WAND RF STERILE (DRAPES) ×2 IMPLANT
DRAPE C-ARM 42X120 X-RAY (DRAPES) ×2 IMPLANT
DRAPE LAPAROSCOPIC ABDOMINAL (DRAPES) ×2 IMPLANT
DRSG TEGADERM 4X4.75 (GAUZE/BANDAGES/DRESSINGS) ×2 IMPLANT
ELECT CAUTERY BLADE 6.4 (BLADE) ×2 IMPLANT
ELECT REM PT RETURN 9FT ADLT (ELECTROSURGICAL) ×2
ELECTRODE REM PT RTRN 9FT ADLT (ELECTROSURGICAL) ×1 IMPLANT
GAUZE 4X4 16PLY RFD (DISPOSABLE) ×2 IMPLANT
GAUZE SPONGE 2X2 8PLY STRL LF (GAUZE/BANDAGES/DRESSINGS) ×1 IMPLANT
GAUZE SPONGE 4X4 12PLY STRL LF (GAUZE/BANDAGES/DRESSINGS) ×2 IMPLANT
GEL ULTRASOUND 20GR AQUASONIC (MISCELLANEOUS) IMPLANT
GLOVE BIO SURGEON STRL SZ7 (GLOVE) ×2 IMPLANT
GLOVE BIOGEL PI IND STRL 7.5 (GLOVE) ×1 IMPLANT
GLOVE BIOGEL PI INDICATOR 7.5 (GLOVE) ×1
GOWN STRL REUS W/ TWL LRG LVL3 (GOWN DISPOSABLE) ×3 IMPLANT
GOWN STRL REUS W/TWL LRG LVL3 (GOWN DISPOSABLE) ×6
INTRODUCER COOK 11FR (CATHETERS) IMPLANT
KIT BASIN OR (CUSTOM PROCEDURE TRAY) ×2 IMPLANT
KIT PORT POWER 8FR ISP CVUE (Port) ×2 IMPLANT
KIT TURNOVER KIT B (KITS) ×2 IMPLANT
NS IRRIG 1000ML POUR BTL (IV SOLUTION) ×2 IMPLANT
PAD ARMBOARD 7.5X6 YLW CONV (MISCELLANEOUS) ×2 IMPLANT
PENCIL BUTTON HOLSTER BLD 10FT (ELECTRODE) ×2 IMPLANT
POSITIONER HEAD DONUT 9IN (MISCELLANEOUS) ×2 IMPLANT
SET INTRODUCER 12FR PACEMAKER (INTRODUCER) IMPLANT
SET SHEATH INTRODUCER 10FR (MISCELLANEOUS) IMPLANT
SHEATH COOK PEEL AWAY SET 9F (SHEATH) IMPLANT
SPONGE GAUZE 2X2 STER 10/PKG (GAUZE/BANDAGES/DRESSINGS) ×1
STRIP CLOSURE SKIN 1/2X4 (GAUZE/BANDAGES/DRESSINGS) ×2 IMPLANT
SUT MNCRL AB 4-0 PS2 18 (SUTURE) ×2 IMPLANT
SUT PROLENE 2 0 SH DA (SUTURE) ×2 IMPLANT
SUT VIC AB 3-0 SH 27 (SUTURE) ×2
SUT VIC AB 3-0 SH 27X BRD (SUTURE) ×1 IMPLANT
SYR 5ML LUER SLIP (SYRINGE) ×2 IMPLANT
TOWEL GREEN STERILE (TOWEL DISPOSABLE) ×2 IMPLANT
TOWEL GREEN STERILE FF (TOWEL DISPOSABLE) ×2 IMPLANT
TRAY LAPAROSCOPIC MC (CUSTOM PROCEDURE TRAY) ×2 IMPLANT

## 2020-08-04 NOTE — Anesthesia Postprocedure Evaluation (Signed)
Anesthesia Post Note  Patient: Cheryl Blankenship  Procedure(s) Performed: INSERTION PORT-A-CATH WITH ULTRASOUND GUIDANCE (N/A Breast)     Patient location during evaluation: PACU Anesthesia Type: General Level of consciousness: awake and alert Pain management: pain level controlled Vital Signs Assessment: post-procedure vital signs reviewed and stable Respiratory status: spontaneous breathing, nonlabored ventilation and respiratory function stable Cardiovascular status: blood pressure returned to baseline and stable Postop Assessment: no apparent nausea or vomiting Anesthetic complications: no   No complications documented.  Last Vitals:  Vitals:   08/04/20 1330 08/04/20 1345  BP: (!) 145/81 139/69  Pulse: 64 62  Resp: 14 13  Temp:    SpO2: 95% 95%    Last Pain:  Vitals:   08/04/20 1400  TempSrc:   PainSc: 0-No pain                 Sokhna Christoph,W. EDMOND

## 2020-08-04 NOTE — Discharge Instructions (Signed)
    PORT-A-CATH: POST OP INSTRUCTIONS  Always review your discharge instruction sheet given to you by the facility where your surgery was performed.   1. A prescription for pain medication may be given to you upon discharge. Take your pain medication as prescribed, if needed. If narcotic pain medicine is not needed, then you make take acetaminophen (Tylenol) or ibuprofen (Advil) as needed.  2. Take your usually prescribed medications unless otherwise directed. 3. If you need a refill on your pain medication, please contact our office. All narcotic pain medicine now requires a paper prescription.  Phoned in and fax refills are no longer allowed by law.  Prescriptions will not be filled after 5 pm or on weekends.  4. You should follow a light diet for the remainder of the day after your procedure. 5. Most patients will experience some mild swelling and/or bruising in the area of the incision. It may take several days to resolve. 6. It is common to experience some constipation if taking pain medication after surgery. Increasing fluid intake and taking a stool softener (such as Colace) will usually help or prevent this problem from occurring. A mild laxative (Milk of Magnesia or Miralax) should be taken according to package directions if there are no bowel movements after 48 hours.  7. Unless discharge instructions indicate otherwise, you may remove your bandages 48 hours after surgery, and you may shower at that time. You may have steri-strips (small white skin tapes) in place directly over the incision.  These strips should be left on the skin for 7-10 days.  If your surgeon used Dermabond (skin glue) on the incision, you may shower in 24 hours.  The glue will flake off over the next 2-3 weeks.  8. If your port is left accessed at the end of surgery (needle left in port), the dressing cannot get wet and should only by changed by a healthcare professional. When the port is no longer accessed (when the  needle has been removed), follow step 7.   9. ACTIVITIES:  Limit activity involving your arms for the next 72 hours. Do no strenuous exercise or activity for 1 week. You may drive when you are no longer taking prescription pain medication, you can comfortably wear a seatbelt, and you can maneuver your car. 10.You may need to see your doctor in the office for a follow-up appointment.  Please       check with your doctor.  11.When you receive a new Port-a-Cath, you will get a product guide and        ID card.  Please keep them in case you need them.  WHEN TO CALL YOUR DOCTOR ([phone]): 1. Fever over 101.0 2. Chills 3. Continued bleeding from incision 4. Increased redness and tenderness at the site 5. Shortness of breath, difficulty breathing   The clinic staff is available to answer your questions during regular business hours. Please don't hesitate to call and ask to speak to one of the nurses or medical assistants for clinical concerns. If you have a medical emergency, go to the nearest emergency room or call 911.  A surgeon from [facility] is always on call at the hospital.     For further information, please visit [email or web address]      

## 2020-08-04 NOTE — Transfer of Care (Signed)
Immediate Anesthesia Transfer of Care Note  Patient: Cheryl Blankenship  Procedure(s) Performed: INSERTION PORT-A-CATH WITH ULTRASOUND GUIDANCE (N/A Breast)  Patient Location: PACU  Anesthesia Type:General  Level of Consciousness: drowsy  Airway & Oxygen Therapy: Patient Spontanous Breathing and Patient connected to face mask oxygen  Post-op Assessment: Report given to RN and Post -op Vital signs reviewed and stable  Post vital signs: Reviewed and stable  Last Vitals:  Vitals Value Taken Time  BP 123/76 08/04/20 1245  Temp    Pulse 59 08/04/20 1247  Resp 15 08/04/20 1247  SpO2 99 % 08/04/20 1247  Vitals shown include unvalidated device data.  Last Pain:  Vitals:   08/04/20 1007  TempSrc:   PainSc: 4       Patients Stated Pain Goal: 3 (27/12/92 9090)  Complications: No complications documented.

## 2020-08-04 NOTE — Op Note (Addendum)
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  I have reviewed and agree with the operative note as documented by the resident.   Procedure and Anesthesia:  Procedure(s) and Anesthesia Type:    * INSERTION PORT-A-CATH WITH ULTRASOUND GUIDANCE - General  ASA Class: 2   Procedure performed: Ultrasound guided right internal jugular vein port placement  Anesthesia: General via LMA  Indications: Chemotherapy administration   Description of procedure: The patient is brought to the operating room placed in the supine position on the operating table.  After an adequate level of general anesthesia was obtained, the patient right arm was tucked at her side.  Her right chest and neck were prepped with ChloraPrep and draped sterile fashion.  A timeout was taken to ensure the proper patient and proper procedure.  She was placed in Trendelenburg position.    We interrogated her neck with the ultrasound.  The jugular vein is easily identified.  Using ultrasound guidance we directly cannulated the internal jugular vein with good blood return.  The wire passed easily.  Fluoroscopy confirmed that the wire headed down the right side of the mediastinum.  The needle was removed.  We created a subcutaneous pocket below the right clavicle.  We anesthetized with local anesthetic.  We created a subcutaneous tunnel from the subcutaneous pocket to the insertion site on the neck.  An 8 French port was assembled and was tunneled from the subcutaneous pocket to the insertion site.  The catheter was cut to the appropriate length using fluoroscopic guidance.  Using fluoroscopic guidance, we passed the dilator and  breakaway sheath over the wire.  The wire and dilator were removed.  The catheter was then advanced through the sheath which was removed.  Fluoroscopy confirmed that there were no kinks along the length of the catheter.  We are able to aspirate blood easily through the port and were able to flush easily. The patient experienced some ectopy, so the catheter was pulled back at the port insertion site, a 3 cm length was cut, and the catheter was again connected to the port. The port was again aspirated and flushed.  The port was secured with two interrupted 2-0 Prolene sutures.  3-0 Vicryl was used to close the subcutaneous tissue and 4-0 Monocryl was used to close the skin at both sites.  Benzoin and Steri-Strips were applied.  An occlusive dressing was placed after the port was accessed.  The patient was then extubated and brought to the recovery room in stable condition.  All sponge, instrument, and needle counts are correct.  Post-op chest x-ray is pending.   Imogene Burn. [name], MD, [facility]  General/ Trauma Surgery   08/04/2020 2:54 PM

## 2020-08-04 NOTE — OR Nursing (Signed)
Pt is awake,alert but sleepy and in NAD at this time. Pt and family verbalized understanding of poc and discharge instructions. instructions given to family and reviewed prior to discharge. Pt is not ready at this time to be discharged, family is aware, pt nausea better at this time. Will con't to monitor until pt is ready to be discharged.

## 2020-08-04 NOTE — Anesthesia Procedure Notes (Signed)
Procedure Name: LMA Insertion Date/Time: 08/04/2020 11:44 AM Performed by: Leonor Liv, CRNA Pre-anesthesia Checklist: Patient identified, Emergency Drugs available, Suction available and Patient being monitored Patient Re-evaluated:Patient Re-evaluated prior to induction Oxygen Delivery Method: Circle system utilized Preoxygenation: Pre-oxygenation with 100% oxygen Induction Type: IV induction Ventilation: Mask ventilation without difficulty LMA: LMA inserted LMA Size: 4.0 Number of attempts: 1 Placement Confirmation: positive ETCO2 and breath sounds checked- equal and bilateral Tube secured with: Tape Dental Injury: Teeth and Oropharynx as per pre-operative assessment

## 2020-08-04 NOTE — Anesthesia Preprocedure Evaluation (Addendum)
Anesthesia Evaluation  Patient identified by MRN, date of birth, ID band Patient awake    Reviewed: Allergy & Precautions, H&P , NPO status , Patient's Chart, lab work & pertinent test results, Unable to perform ROS - Chart review only  Airway Mallampati: III  TM Distance: >3 FB Neck ROM: Full    Dental no notable dental hx. (+) Teeth Intact, Dental Advisory Given   Pulmonary neg pulmonary ROS, former smoker,    Pulmonary exam normal breath sounds clear to auscultation       Cardiovascular hypertension, Pt. on medications  Rhythm:Regular Rate:Normal  Echo 07/2020  1. Left ventricular ejection fraction, by estimation, is 65 to 70%. The left ventricle has normal function. The left ventricle has no regional wall motion abnormalities. Left ventricular diastolic parameters are indeterminate. The average left ventricular global longitudinal strain is -17.1%.  2. Right ventricular systolic function is normal. The right ventricular size is normal. There is normal pulmonary artery systolic pressure.  3. The mitral valve is normal in structure. Trivial mitral valve regurgitation. No evidence of mitral stenosis.  4. The aortic valve is normal in structure. Aortic valve regurgitation is not visualized. No aortic stenosis is present.   Neuro/Psych Anxiety negative neurological ROS     GI/Hepatic negative GI ROS, Neg liver ROS,   Endo/Other  diabetes, Type 2, Oral Hypoglycemic AgentsMorbid obesity  Renal/GU negative Renal ROS  negative genitourinary   Musculoskeletal negative musculoskeletal ROS (+)   Abdominal   Peds  Hematology negative hematology ROS (+)   Anesthesia Other Findings   Reproductive/Obstetrics negative OB ROS                            Anesthesia Physical Anesthesia Plan  ASA: III  Anesthesia Plan: General   Post-op Pain Management:    Induction: Intravenous  PONV Risk Score  and Plan: 4 or greater and Ondansetron and Dexamethasone  Airway Management Planned: LMA and Oral ETT  Additional Equipment:   Intra-op Plan:   Post-operative Plan: Extubation in OR  Informed Consent: I have reviewed the patients History and Physical, chart, labs and discussed the procedure including the risks, benefits and alternatives for the proposed anesthesia with the patient or authorized representative who has indicated his/her understanding and acceptance.     Dental advisory given  Plan Discussed with: CRNA  Anesthesia Plan Comments:         Anesthesia Quick Evaluation

## 2020-08-04 NOTE — Progress Notes (Signed)
Update to biosimilar:  Changed to Herceptin per insurance allowing and status of inventory

## 2020-08-04 NOTE — OR Nursing (Addendum)
Pt to wheelchair for discharge, all belongings with patient, awaiting ride for discharge, pt is in nad at this time. Per CRNA and receiving RN pt to leave port accessed until tomorrow for chemo check.

## 2020-08-04 NOTE — Interval H&P Note (Signed)
History and Physical Interval Note:  08/04/2020 9:23 AM  Cheryl Blankenship  has presented today for surgery, with the diagnosis of LEFT BREAST CANCER WITH AXILLARY METASTASES.  The various methods of treatment have been discussed with the patient and family. After consideration of risks, benefits and other options for treatment, the patient has consented to  Procedure(s): INSERTION PORT-A-CATH WITH ULTRASOUND GUIDANCE (N/A) as a surgical intervention.  The patient's history has been reviewed, patient examined, no change in status, stable for surgery.  I have reviewed the patient's chart and labs.  Questions were answered to the patient's satisfaction.     [name]

## 2020-08-04 NOTE — Progress Notes (Signed)
Pharmacist Chemotherapy Monitoring - Initial Assessment    Anticipated start date: 08/05/20  Regimen:  . Are orders appropriate based on the patient's diagnosis, regimen, and cycle? Yes . Does the plan date match the patient's scheduled date? Yes . Is the sequencing of drugs appropriate? Yes . Are the premedications appropriate for the patient's regimen? Yes . Prior Authorization for treatment is: Approved o If applicable, is the correct biosimilar selected based on the patient's insurance? yes  Organ Function and Labs: Marland Kitchen Are dose adjustments needed based on the patient's renal function, hepatic function, or hematologic function? No . Are appropriate labs ordered prior to the start of patient's treatment? Yes . Other organ system assessment, if indicated: trastuzumab: Echo/ MUGA and pertuzumab: Echo/ MUGA . The following baseline labs, if indicated, have been ordered: N/A  Dose Assessment: . Are the drug doses appropriate? Yes . Are the following correct: o Drug concentrations Yes o IV fluid compatible with drug Yes o Administration routes Yes o Timing of therapy Yes . If applicable, does the patient have documented access for treatment and/or plans for port-a-cath placement? yes . If applicable, have lifetime cumulative doses been properly documented and assessed? not applicable Lifetime Dose Tracking  No doses have been documented on this patient for the following tracked chemicals: Doxorubicin, Epirubicin, Idarubicin, Daunorubicin, Mitoxantrone, Bleomycin, Oxaliplatin, Carboplatin, Liposomal Doxorubicin  o   Toxicity Monitoring/Prevention: . The patient has the following take home antiemetics prescribed: Ondansetron and Prochlorperazine . The patient has the following take home medications prescribed: N/A . Medication allergies and previous infusion related reactions, if applicable, have been reviewed and addressed. No . The patient's current medication list has been assessed  for drug-drug interactions with their chemotherapy regimen. no significant drug-drug interactions were identified on review.  Order Review: . Are the treatment plan orders signed? Yes . Is the patient scheduled to see a provider prior to their treatment? Yes  I verify that I have reviewed each item in the above checklist and answered each question accordingly.  Romualdo Bolk [facility] 08/04/2020 11:01 AM

## 2020-08-05 ENCOUNTER — Inpatient Hospital Stay: Payer: Medicare HMO

## 2020-08-05 ENCOUNTER — Encounter: Payer: Self-pay | Admitting: Hematology

## 2020-08-05 ENCOUNTER — Ambulatory Visit (HOSPITAL_BASED_OUTPATIENT_CLINIC_OR_DEPARTMENT_OTHER): Payer: Medicare HMO | Admitting: Medical

## 2020-08-05 ENCOUNTER — Ambulatory Visit: Payer: Medicare HMO

## 2020-08-05 ENCOUNTER — Inpatient Hospital Stay (HOSPITAL_BASED_OUTPATIENT_CLINIC_OR_DEPARTMENT_OTHER): Payer: Medicare HMO | Admitting: Hematology

## 2020-08-05 ENCOUNTER — Other Ambulatory Visit: Payer: Self-pay

## 2020-08-05 ENCOUNTER — Other Ambulatory Visit: Payer: Self-pay | Admitting: Medical

## 2020-08-05 ENCOUNTER — Encounter: Payer: Self-pay | Admitting: *Deleted

## 2020-08-05 VITALS — BP 121/65 | HR 65 | Temp 97.7°F | Resp 18 | Ht 63.0 in | Wt 218.8 lb

## 2020-08-05 VITALS — BP 143/73 | HR 75 | Temp 98.3°F | Resp 18

## 2020-08-05 DIAGNOSIS — C50412 Malignant neoplasm of upper-outer quadrant of left female breast: Secondary | ICD-10-CM

## 2020-08-05 DIAGNOSIS — Z5112 Encounter for antineoplastic immunotherapy: Secondary | ICD-10-CM | POA: Diagnosis not present

## 2020-08-05 DIAGNOSIS — R0602 Shortness of breath: Secondary | ICD-10-CM

## 2020-08-05 DIAGNOSIS — Z170 Estrogen receptor positive status [ER+]: Secondary | ICD-10-CM

## 2020-08-05 DIAGNOSIS — R739 Hyperglycemia, unspecified: Secondary | ICD-10-CM

## 2020-08-05 LAB — CMP (CANCER CENTER ONLY)
ALT: 11 U/L (ref 0–44)
AST: 10 U/L — ABNORMAL LOW (ref 15–41)
Albumin: 3.2 g/dL — ABNORMAL LOW (ref 3.5–5.0)
Alkaline Phosphatase: 67 U/L (ref 38–126)
Anion gap: 4 — ABNORMAL LOW (ref 5–15)
BUN: 17 mg/dL (ref 8–23)
CO2: 29 mmol/L (ref 22–32)
Calcium: 9.6 mg/dL (ref 8.9–10.3)
Chloride: 106 mmol/L (ref 98–111)
Creatinine: 1.01 mg/dL — ABNORMAL HIGH (ref 0.44–1.00)
GFR, Estimated: >60 mL/min (ref >60)
Glucose, Bld: 330 mg/dL — ABNORMAL HIGH (ref 70–99)
Potassium: 4.1 mmol/L (ref 3.5–5.1)
Sodium: 139 mmol/L (ref 135–145)
Total Bilirubin: 0.3 mg/dL (ref 0.3–1.2)
Total Protein: 6.1 g/dL — ABNORMAL LOW (ref 6.5–8.1)

## 2020-08-05 LAB — CBC WITH DIFFERENTIAL (CANCER CENTER ONLY)
Abs Immature Granulocytes: 0.01 10*3/uL (ref 0.00–0.07)
Basophils Absolute: 0 10*3/uL (ref 0.0–0.1)
Basophils Relative: 1 %
Eosinophils Absolute: 0.1 10*3/uL (ref 0.0–0.5)
Eosinophils Relative: 3 %
HCT: 35.1 % — ABNORMAL LOW (ref 36.0–46.0)
Hemoglobin: 11.3 g/dL — ABNORMAL LOW (ref 12.0–15.0)
Immature Granulocytes: 0 %
Lymphocytes Relative: 38 %
Lymphs Abs: 2.1 10*3/uL (ref 0.7–4.0)
MCH: 28.2 pg (ref 26.0–34.0)
MCHC: 32.2 g/dL (ref 30.0–36.0)
MCV: 87.5 fL (ref 80.0–100.0)
Monocytes Absolute: 0.4 10*3/uL (ref 0.1–1.0)
Monocytes Relative: 7 %
Neutro Abs: 2.9 10*3/uL (ref 1.7–7.7)
Neutrophils Relative %: 51 %
Platelet Count: 224 10*3/uL (ref 150–400)
RBC: 4.01 MIL/uL (ref 3.87–5.11)
RDW: 12.7 % (ref 11.5–15.5)
WBC Count: 5.6 10*3/uL (ref 4.0–10.5)
nRBC: 0 % (ref 0.0–0.2)

## 2020-08-05 LAB — GLUCOSE, CAPILLARY: Glucose-Capillary: 261 mg/dL — ABNORMAL HIGH (ref 70–99)

## 2020-08-05 MED ORDER — INSULIN ASPART 100 UNIT/ML ~~LOC~~ SOLN
10.0000 [IU] | Freq: Once | SUBCUTANEOUS | Status: AC
  Administered 2020-08-05: 10 [IU] via SUBCUTANEOUS

## 2020-08-05 MED ORDER — DIPHENHYDRAMINE HCL 25 MG PO CAPS
ORAL_CAPSULE | ORAL | Status: AC
  Filled 2020-08-05: qty 2

## 2020-08-05 MED ORDER — SODIUM CHLORIDE 0.9 % IV SOLN
840.0000 mg | Freq: Once | INTRAVENOUS | Status: AC
  Administered 2020-08-05: 840 mg via INTRAVENOUS
  Filled 2020-08-05: qty 28

## 2020-08-05 MED ORDER — SODIUM CHLORIDE 0.9 % IV SOLN
60.0000 mg/m2 | Freq: Once | INTRAVENOUS | Status: AC
  Administered 2020-08-05: 120 mg via INTRAVENOUS
  Filled 2020-08-05: qty 12

## 2020-08-05 MED ORDER — HEPARIN SOD (PORK) LOCK FLUSH 100 UNIT/ML IV SOLN
500.0000 [IU] | Freq: Once | INTRAVENOUS | Status: AC | PRN
  Administered 2020-08-05: 500 [IU]
  Filled 2020-08-05: qty 5

## 2020-08-05 MED ORDER — ACETAMINOPHEN 325 MG PO TABS
650.0000 mg | ORAL_TABLET | Freq: Once | ORAL | Status: AC
  Administered 2020-08-05: 650 mg via ORAL

## 2020-08-05 MED ORDER — SODIUM CHLORIDE 0.9 % IV SOLN
Freq: Once | INTRAVENOUS | Status: AC
  Filled 2020-08-05: qty 250

## 2020-08-05 MED ORDER — SODIUM CHLORIDE 0.9% FLUSH
10.0000 mL | INTRAVENOUS | Status: DC | PRN
  Administered 2020-08-05: 10 mL
  Filled 2020-08-05: qty 10

## 2020-08-05 MED ORDER — ALBUTEROL SULFATE HFA 108 (90 BASE) MCG/ACT IN AERS: 2.0000 | INHALATION_SPRAY | Freq: Once | RESPIRATORY_TRACT | Status: DC

## 2020-08-05 MED ORDER — ALBUTEROL SULFATE HFA 108 (90 BASE) MCG/ACT IN AERS
2.0000 | INHALATION_SPRAY | Freq: Once | RESPIRATORY_TRACT | Status: AC
  Administered 2020-08-05: 2 via RESPIRATORY_TRACT

## 2020-08-05 MED ORDER — DIPHENOXYLATE-ATROPINE 2.5-0.025 MG PO TABS
1.0000 | ORAL_TABLET | Freq: Four times a day (QID) | ORAL | 0 refills | Status: DC | PRN
Start: 2020-08-05 — End: 2020-09-01

## 2020-08-05 MED ORDER — INSULIN ASPART 100 UNIT/ML ~~LOC~~ SOLN
SUBCUTANEOUS | Status: AC
  Filled 2020-08-05: qty 1

## 2020-08-05 MED ORDER — DIPHENHYDRAMINE HCL 25 MG PO CAPS
50.0000 mg | ORAL_CAPSULE | Freq: Once | ORAL | Status: AC
  Administered 2020-08-05: 50 mg via ORAL

## 2020-08-05 MED ORDER — ACETAMINOPHEN 325 MG PO TABS
ORAL_TABLET | ORAL | Status: AC
  Filled 2020-08-05: qty 2

## 2020-08-05 MED ORDER — ALBUTEROL SULFATE HFA 108 (90 BASE) MCG/ACT IN AERS
INHALATION_SPRAY | RESPIRATORY_TRACT | Status: AC
  Filled 2020-08-05: qty 6.7

## 2020-08-05 MED ORDER — TRASTUZUMAB CHEMO 150 MG IV SOLR: 8.0000 mg/kg | Freq: Once | INTRAVENOUS | Status: DC

## 2020-08-05 MED ORDER — TRASTUZUMAB CHEMO 150 MG IV SOLR
750.0000 mg | Freq: Once | INTRAVENOUS | Status: AC
  Administered 2020-08-05: 750 mg via INTRAVENOUS
  Filled 2020-08-05: qty 35.72

## 2020-08-05 MED ORDER — SODIUM CHLORIDE 0.9 % IV SOLN
10.0000 mg | Freq: Once | INTRAVENOUS | Status: AC
  Administered 2020-08-05: 10 mg via INTRAVENOUS
  Filled 2020-08-05: qty 10

## 2020-08-05 NOTE — Progress Notes (Signed)
Nutrition Assessment   Reason for Assessment:  Referral from Dr Burr Medico, low appetite, elevated blood glucose, oral supplements   ASSESSMENT:  69 year old female with left breast cancer.  Patient receiving chemotherapy, first time today.  Past medical history of CHF, DM, HTN, arthritis.  Met with patient in infusion today.  She is eating breakfast sandwich from [employer] during visit (egg, bacon) and tolerating well. Drinking cranberry juice. She is very sleepy during the middle to end of conversation (given benadryl).  Reports that her appetite has been decrease for the last few weeks since starting [employer].  Says that she is not taking it anymore.  Has issues with nausea and takes zofran. Reports smell of food sometimes upsets her stomach.  Reports that she has been eating candy recently. Says that she only ate a salad yesterday and that was for dinner.     Medications: lantus, glipizide, lasix, novolin, metformin   Labs: glucose 330   Anthropometrics:   Height: 63 inches Weight: 218 lb 11/17 220 lb in 03/10/20 BMI: 38  Stable weight   NUTRITION DIAGNOSIS: Inadequate oral intake related to altered GI function (nausea) as evidenced by decreased appetite   INTERVENTION:  Discussed no concentrated sweets diet with patient. Handout provided.   Encouraged small frequent meals Encouraged foods high in protein.  Samples of glucerna, boost glucose control along with coupons given to patient.  Contact information provided   MONITORING, EVALUATION, GOAL: weight trends, intake   Next Visit: as needed with treatments  Traci Plemons B. Zenia Resides, [place], [place] Registered Dietitian [phone] (mobile)

## 2020-08-05 NOTE — Patient Instructions (Signed)
[facility] Discharge Instructions for Patients Receiving Chemotherapy  Today you received the following chemotherapy agents: Trastuzumab, Pertuzumab, and Docetaxel  To help prevent nausea and vomiting after your treatment, we encourage you to take your nausea medication  as prescribed.    If you develop nausea and vomiting that is not controlled by your nausea medication, call the clinic.   BELOW ARE SYMPTOMS THAT SHOULD BE REPORTED IMMEDIATELY:  *FEVER GREATER THAN 100.5 F  *CHILLS WITH OR WITHOUT FEVER  NAUSEA AND VOMITING THAT IS NOT CONTROLLED WITH YOUR NAUSEA MEDICATION  *UNUSUAL SHORTNESS OF BREATH  *UNUSUAL BRUISING OR BLEEDING  TENDERNESS IN MOUTH AND THROAT WITH OR WITHOUT PRESENCE OF ULCERS  *URINARY PROBLEMS  *BOWEL PROBLEMS  UNUSUAL RASH Items with * indicate a potential emergency and should be followed up as soon as possible.  Feel free to call the clinic should you have any questions or concerns. The clinic phone number is [phone].  Please show the [place] at check-in to the Emergency Department and triage nurse.

## 2020-08-06 ENCOUNTER — Telehealth: Payer: Self-pay | Admitting: *Deleted

## 2020-08-06 ENCOUNTER — Other Ambulatory Visit: Payer: Self-pay | Admitting: Hematology

## 2020-08-06 ENCOUNTER — Encounter: Payer: Self-pay | Admitting: *Deleted

## 2020-08-06 ENCOUNTER — Telehealth: Payer: Self-pay | Admitting: Hematology

## 2020-08-06 DIAGNOSIS — C50412 Malignant neoplasm of upper-outer quadrant of left female breast: Secondary | ICD-10-CM

## 2020-08-06 DIAGNOSIS — Z170 Estrogen receptor positive status [ER+]: Secondary | ICD-10-CM

## 2020-08-06 LAB — CANCER ANTIGEN 27.29: CA 27.29: 85.4 U/mL — ABNORMAL HIGH (ref 0.0–38.6)

## 2020-08-06 NOTE — Telephone Encounter (Signed)
Scheduled per 11/17 los. Unable to reach pt. Left voicemail with appt times and dates. 

## 2020-08-07 ENCOUNTER — Other Ambulatory Visit: Payer: Self-pay

## 2020-08-07 ENCOUNTER — Ambulatory Visit (HOSPITAL_COMMUNITY)
Admission: RE | Admit: 2020-08-07 | Discharge: 2020-08-07 | Disposition: A | Discharge status: Home / Self Care | Payer: Medicare HMO | Source: Ambulatory Visit | Attending: Hematology | Admitting: Hematology

## 2020-08-07 DIAGNOSIS — Z170 Estrogen receptor positive status [ER+]: Secondary | ICD-10-CM | POA: Diagnosis present

## 2020-08-07 DIAGNOSIS — C50412 Malignant neoplasm of upper-outer quadrant of left female breast: Secondary | ICD-10-CM | POA: Diagnosis present

## 2020-08-07 IMAGING — CT CT ABD-PELV W/ CM
2 of 5 series · 13 of 36 positions shown, 16 images · IV contrast (omnipaque)
Comparison: None.

CLINICAL DATA: Newly diagnosed left breast cancer, for staging

EXAM:
CT CHEST, ABDOMEN, AND PELVIS WITH CONTRAST
TECHNIQUE: Multidetector CT imaging of the chest, abdomen and pelvis was
performed following the standard protocol during bolus
administration of intravenous contrast.
CONTRAST:  100mL OMNIPAQUE IOHEXOL 300 MG/ML  SOLN

[Series 2: cap with · axial · 0.98mm/px · z∈[-496,-2]mm · 10 of 121 slices shown, 13 images]
[im 11/121  mediastinal]
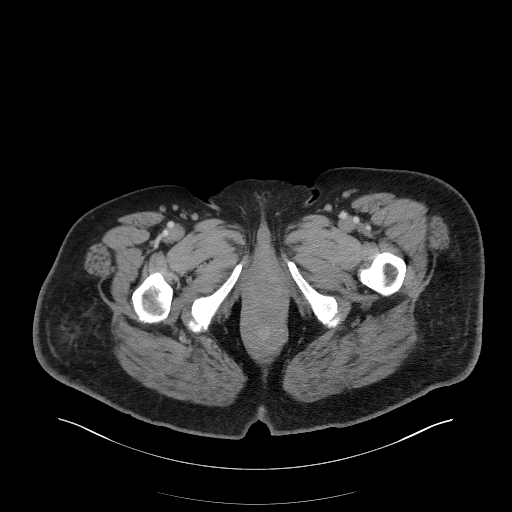
[im 11/121  lung]
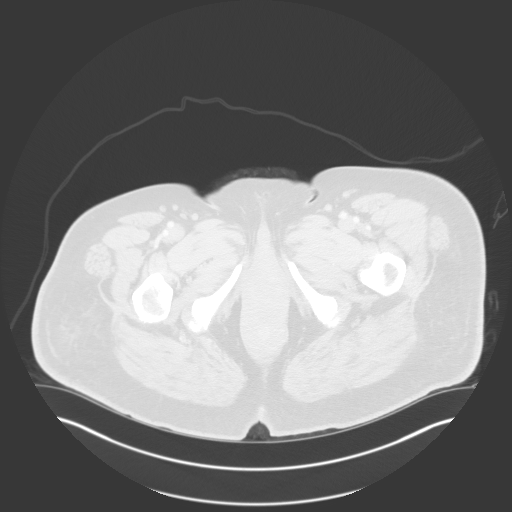
[im 22/121  lung]
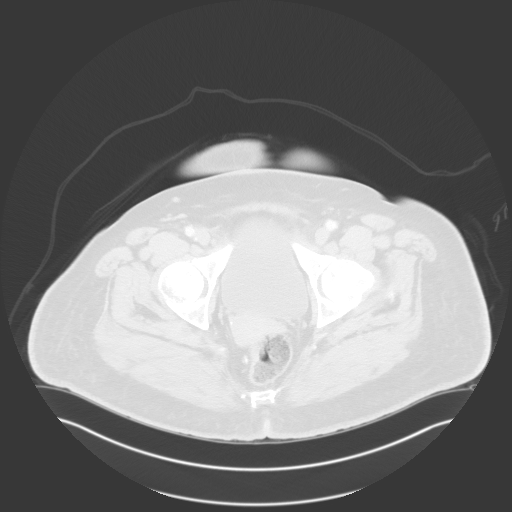
[im 33/121  lung]
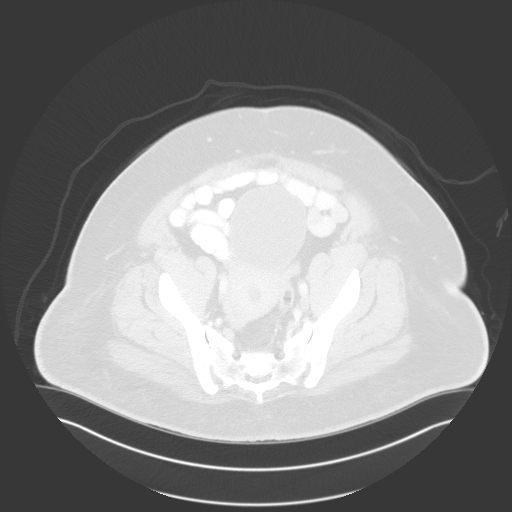
[im 44/121  lung]
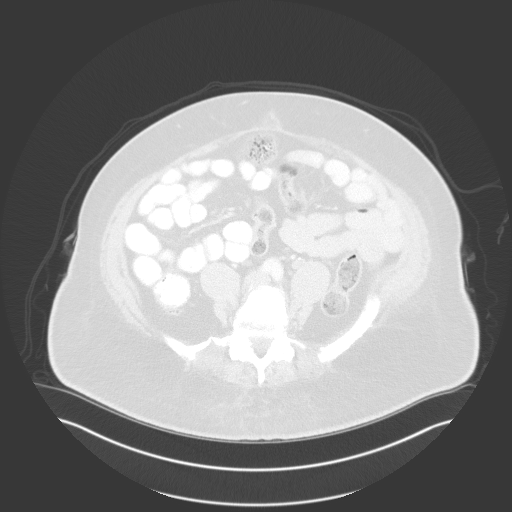
[im 55/121  mediastinal]
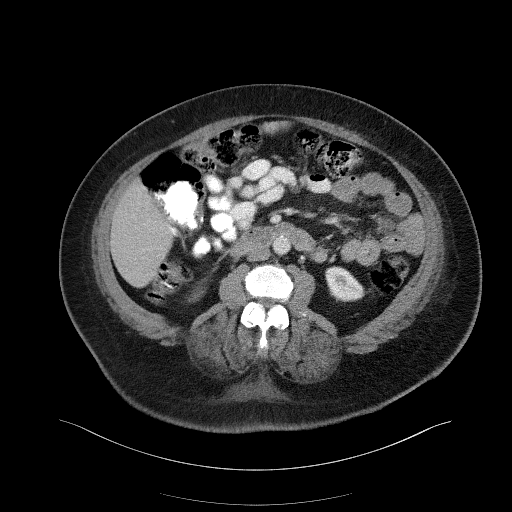
[im 55/121  lung]
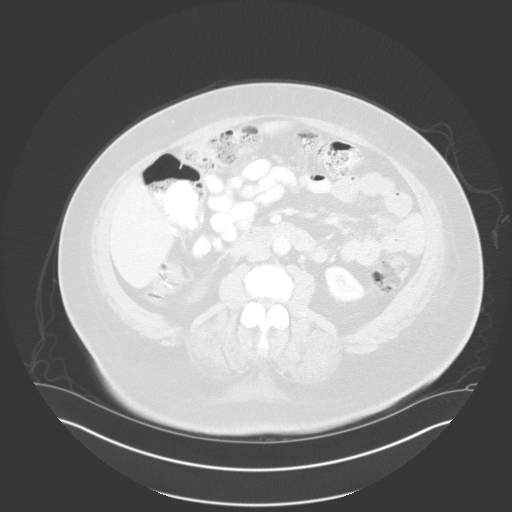
[im 66/121  lung]
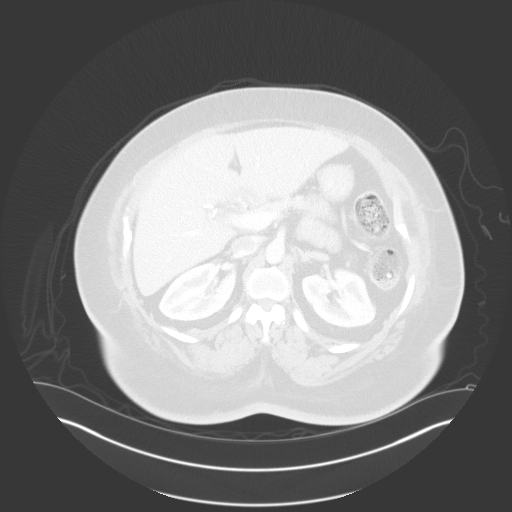
[im 77/121  lung]
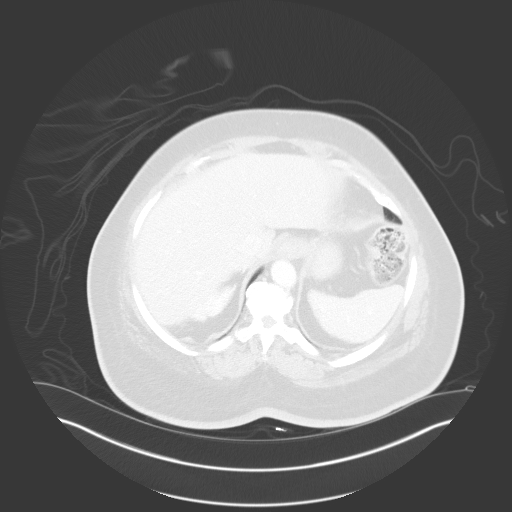
[im 88/121  lung]
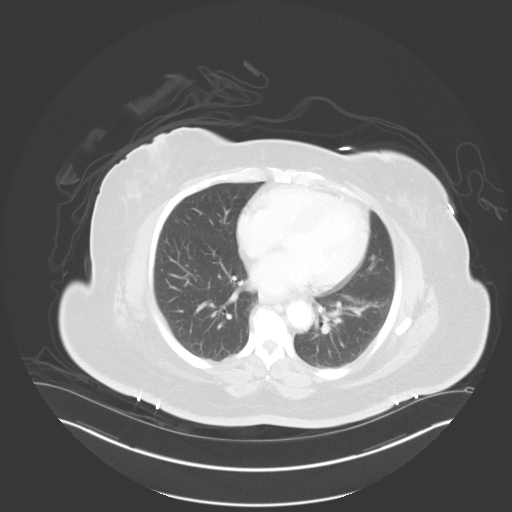
[im 99/121  mediastinal]
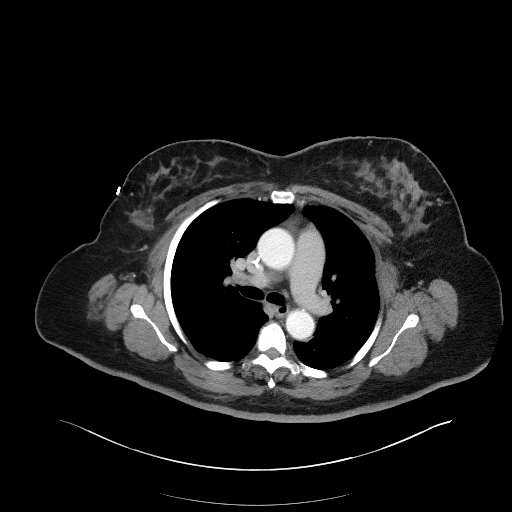
[im 99/121  lung]
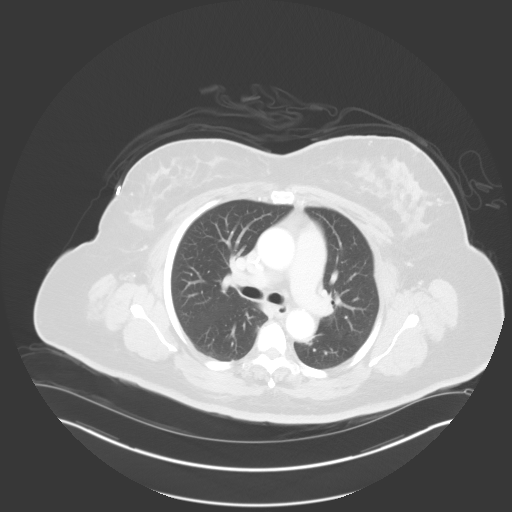
[im 110/121  lung]
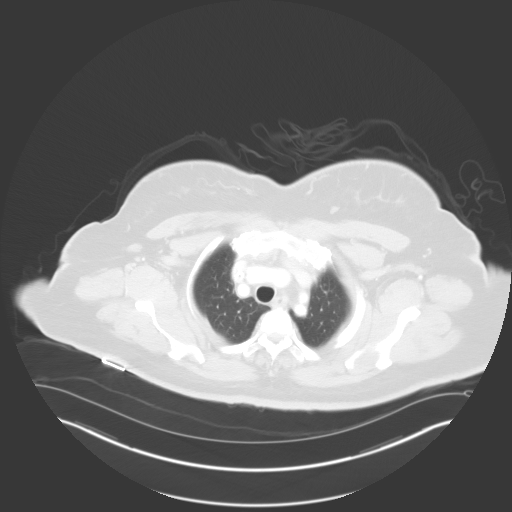

[Series 6: coronals · coronal · 0.94mm/px · 3 of 191 slices shown]
[im 39/191  lung]
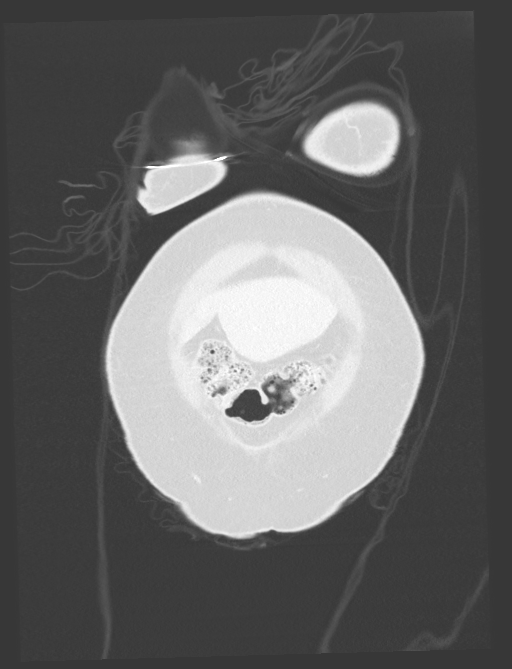
[im 77/191  lung]
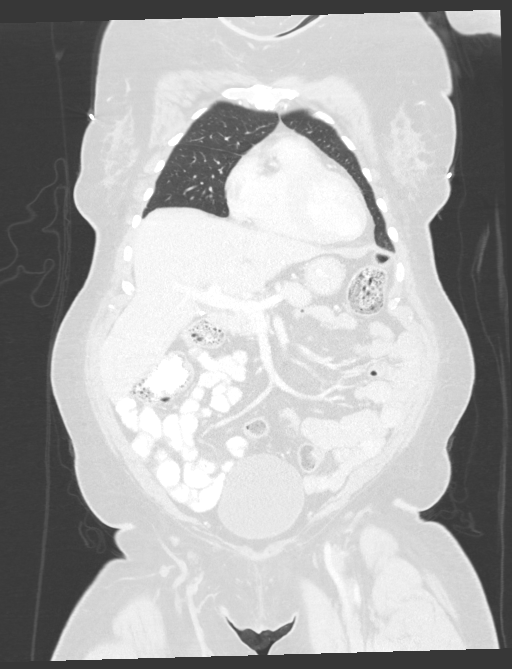
[im 115/191  lung]
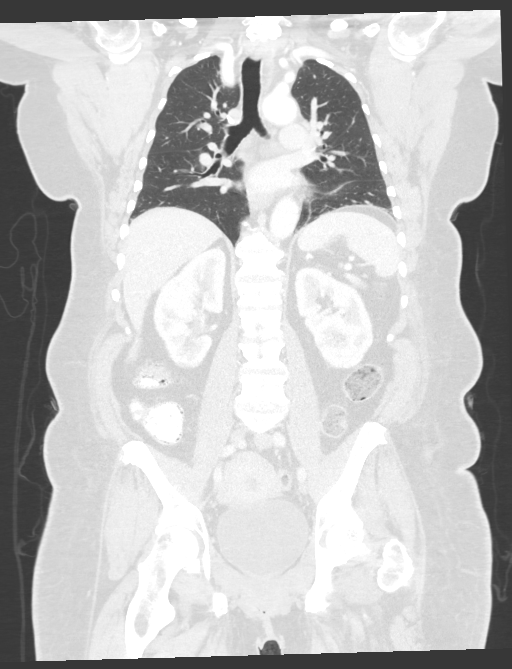

[13 of 36 positions shown; findings below may reference images not displayed]

FINDINGS: CT CHEST FINDINGS

Cardiovascular: The heart is normal in size. No pericardial
effusion.

No evidence of thoracic aortic aneurysm.

Very mild coronary atherosclerosis of the LAD (series 2/image 29).

Right chest port terminates the cavoatrial junction.

Mediastinum/Nodes: No suspicious mediastinal lymphadenopathy.

8 mm short axis left axillary node (series 2/image 17), suspicious
in this clinical context.

Visualized thyroid is unremarkable.

Lungs/Pleura: No suspicious pulmonary nodules.

No focal consolidation.  Mild atelectasis at the left lung base.

No pleural effusion or pneumothorax.

Musculoskeletal: 2.7 cm mass in the upper outer left breast (series
2/image 19), likely corresponding to the patient's known primary
breast neoplasm.

Degenerative changes of the thoracic spine. No focal osseous
lesions.

CT ABDOMEN PELVIS FINDINGS

Hepatobiliary: Liver is within normal limits. No
suspicious/enhancing hepatic lesions.

Status post cholecystectomy. No intrahepatic or extrahepatic ductal
dilatation.

Pancreas: Within normal limits.

Spleen: Within normal limits.

Adrenals/Urinary Tract: Adrenal glands are within normal limits.

Kidneys are within normal limits.  No hydronephrosis.

Bladder is within normal limits.

Stomach/Bowel: Stomach is within normal limits.

No evidence of bowel obstruction.

Appendix is not discretely visualized and is reportedly surgically
absent.

No colonic wall thickening or mass is seen.

Vascular/Lymphatic: No evidence of abdominal aortic aneurysm.

Atherosclerotic calcifications of the abdominal aorta and branch
vessels.

No suspicious abdominopelvic lymphadenopathy.

Reproductive: Uterus is notable for mild prominence of the
endometrium, measuring 11 mm (sagittal image 115).

Bilateral ovaries are within normal limits.

Other: No abdominopelvic ascites.

Moderate fat containing periumbilical hernia (series 2/image 82).

Musculoskeletal: No focal osseous lesions.
IMPRESSION: 2.7 cm mass in the upper outer left breast, likely corresponding to
the patient's known primary breast neoplasm.

8 mm short axis left axillary node, suspicious for nodal metastasis
in this clinical context.

No findings suspicious for distant metastasis.

Endometrium is mildly prominent, measuring 11 mm. Correlate for
vaginal bleeding and consider pelvic ultrasound for further
evaluation if clinically warranted.

## 2020-08-07 MED ORDER — IOHEXOL 300 MG/ML  SOLN
100.0000 mL | Freq: Once | INTRAMUSCULAR | Status: AC | PRN
  Administered 2020-08-07: 100 mL via INTRAVENOUS
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
## 2020-08-11 ENCOUNTER — Inpatient Hospital Stay: Payer: Medicare HMO

## 2020-08-11 ENCOUNTER — Other Ambulatory Visit: Payer: Self-pay

## 2020-08-11 ENCOUNTER — Encounter: Payer: Self-pay | Admitting: *Deleted

## 2020-08-11 ENCOUNTER — Other Ambulatory Visit: Payer: Self-pay | Admitting: Diagnostic Radiology

## 2020-08-11 ENCOUNTER — Inpatient Hospital Stay (HOSPITAL_BASED_OUTPATIENT_CLINIC_OR_DEPARTMENT_OTHER): Payer: Medicare HMO | Admitting: Hematology

## 2020-08-11 ENCOUNTER — Ambulatory Visit
Admission: RE | Admit: 2020-08-11 | Discharge: 2020-08-11 | Disposition: A | Discharge status: Home / Self Care | Payer: Medicare HMO | Source: Ambulatory Visit | Attending: Hematology | Admitting: Hematology

## 2020-08-11 ENCOUNTER — Encounter: Payer: Self-pay | Admitting: Hematology

## 2020-08-11 VITALS — BP 159/83 | HR 63 | Temp 98.3°F | Resp 17 | Ht 63.0 in | Wt 217.0 lb

## 2020-08-11 DIAGNOSIS — C50412 Malignant neoplasm of upper-outer quadrant of left female breast: Secondary | ICD-10-CM

## 2020-08-11 DIAGNOSIS — Z170 Estrogen receptor positive status [ER+]: Secondary | ICD-10-CM | POA: Diagnosis not present

## 2020-08-11 DIAGNOSIS — E860 Dehydration: Secondary | ICD-10-CM

## 2020-08-11 DIAGNOSIS — Z5112 Encounter for antineoplastic immunotherapy: Secondary | ICD-10-CM | POA: Diagnosis not present

## 2020-08-11 LAB — CMP (CANCER CENTER ONLY)
ALT: 22 U/L (ref 0–44)
AST: 14 U/L — ABNORMAL LOW (ref 15–41)
Albumin: 3.7 g/dL (ref 3.5–5.0)
Alkaline Phosphatase: 65 U/L (ref 38–126)
Anion gap: 8 (ref 5–15)
BUN: 18 mg/dL (ref 8–23)
CO2: 31 mmol/L (ref 22–32)
Calcium: 10.3 mg/dL (ref 8.9–10.3)
Chloride: 102 mmol/L (ref 98–111)
Creatinine: 1.03 mg/dL — ABNORMAL HIGH (ref 0.44–1.00)
GFR, Estimated: 60 mL/min — ABNORMAL LOW (ref >60)
Glucose, Bld: 233 mg/dL — ABNORMAL HIGH (ref 70–99)
Potassium: 3.7 mmol/L (ref 3.5–5.1)
Sodium: 141 mmol/L (ref 135–145)
Total Bilirubin: 0.5 mg/dL (ref 0.3–1.2)
Total Protein: 7.1 g/dL (ref 6.5–8.1)

## 2020-08-11 LAB — CBC WITH DIFFERENTIAL (CANCER CENTER ONLY)
Abs Immature Granulocytes: 0.04 10*3/uL (ref 0.00–0.07)
Basophils Absolute: 0.1 10*3/uL (ref 0.0–0.1)
Basophils Relative: 2 %
Eosinophils Absolute: 0 10*3/uL (ref 0.0–0.5)
Eosinophils Relative: 1 %
HCT: 37 % (ref 36.0–46.0)
Hemoglobin: 11.8 g/dL — ABNORMAL LOW (ref 12.0–15.0)
Immature Granulocytes: 1 %
Lymphocytes Relative: 48 %
Lymphs Abs: 1.8 10*3/uL (ref 0.7–4.0)
MCH: 28 pg (ref 26.0–34.0)
MCHC: 31.9 g/dL (ref 30.0–36.0)
MCV: 87.9 fL (ref 80.0–100.0)
Monocytes Absolute: 0.1 10*3/uL (ref 0.1–1.0)
Monocytes Relative: 2 %
Neutro Abs: 1.8 10*3/uL (ref 1.7–7.7)
Neutrophils Relative %: 46 %
Platelet Count: 211 10*3/uL (ref 150–400)
RBC: 4.21 MIL/uL (ref 3.87–5.11)
RDW: 12.5 % (ref 11.5–15.5)
WBC Count: 3.8 10*3/uL — ABNORMAL LOW (ref 4.0–10.5)
nRBC: 0 % (ref 0.0–0.2)

## 2020-08-11 IMAGING — MG MM BREAST LOCALIZATION CLIP
4 series · 4 of 12 positions shown · non-contrast
Comparison: Previous exam(s).

CLINICAL DATA: Status post MRI guided biopsy for an indeterminate 8
mm mass within the upper inner quadrant the RIGHT breast

EXAM:
DIAGNOSTIC RIGHT MAMMOGRAM POST MRI BIOPSY

[R ML synth-2D]
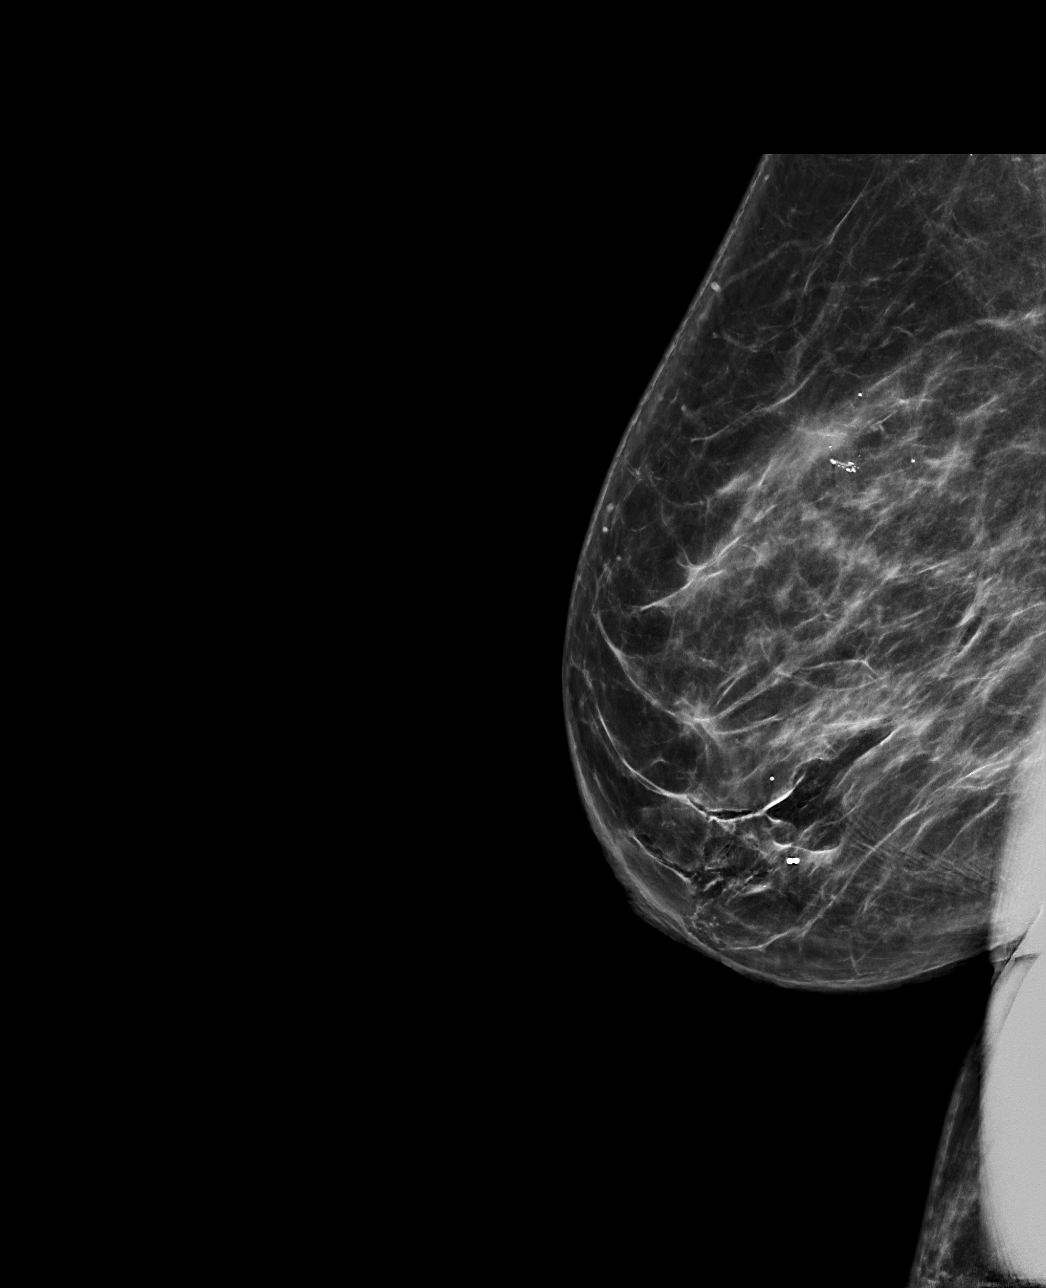

[R CC synth-2D]
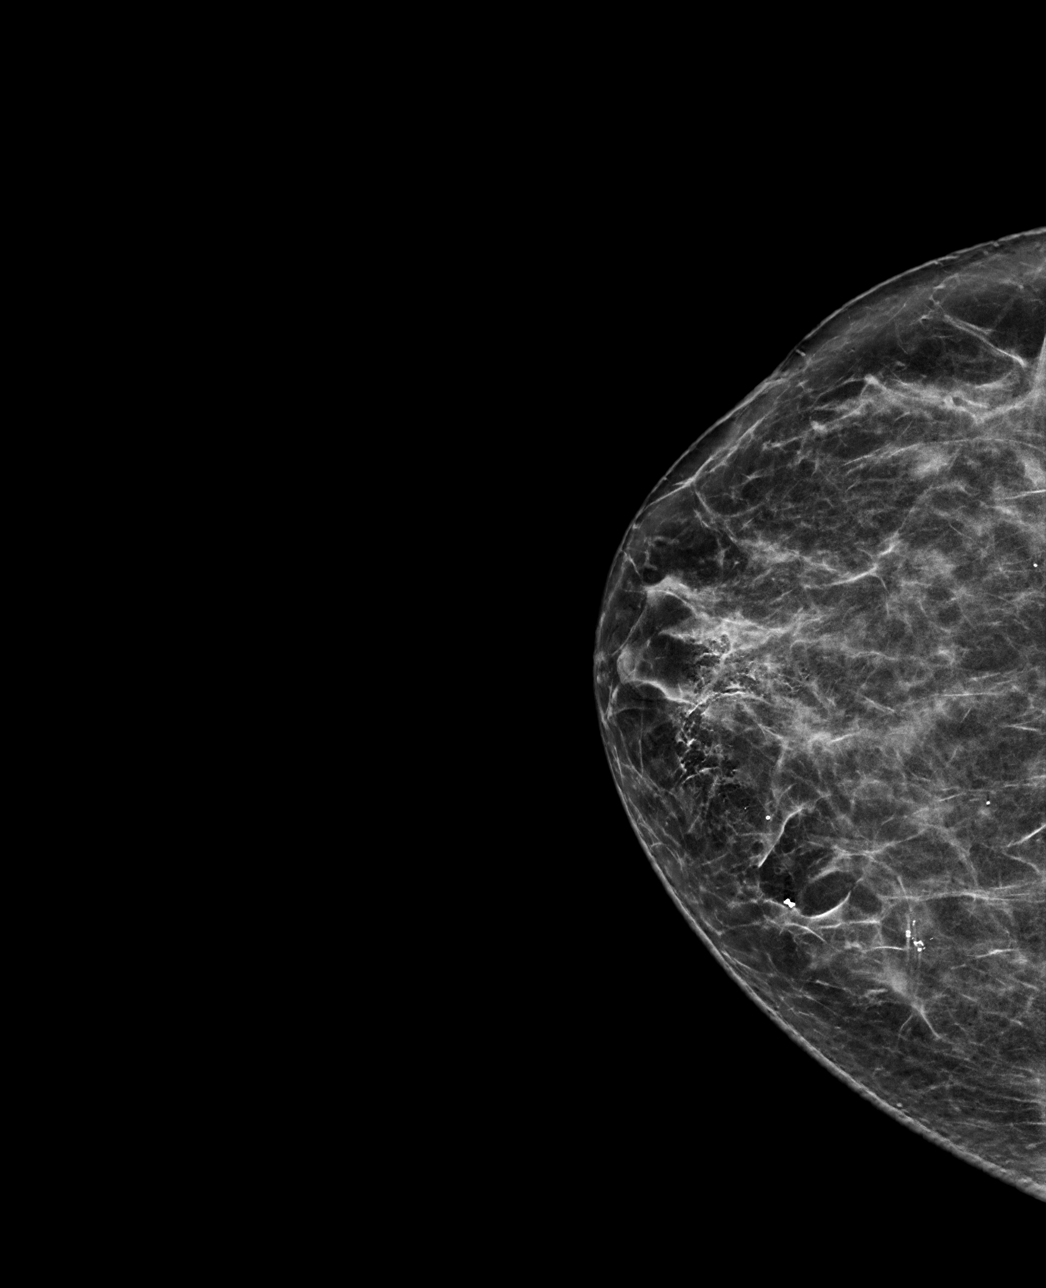

[R ML tomo · tomo slice 42/83.0]
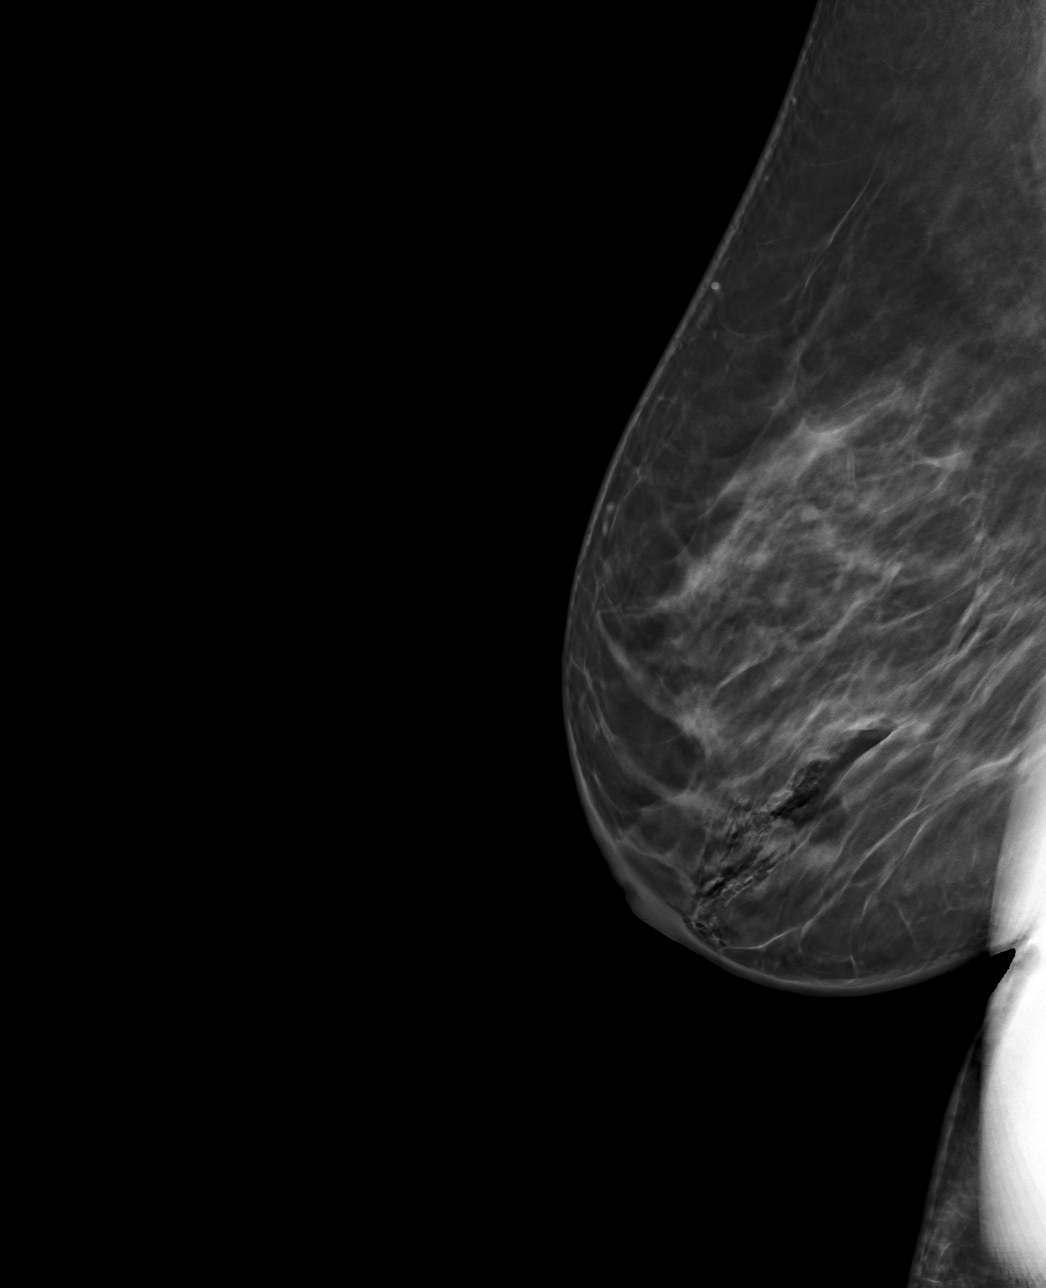

[R CC tomo · tomo slice 37/72.0]
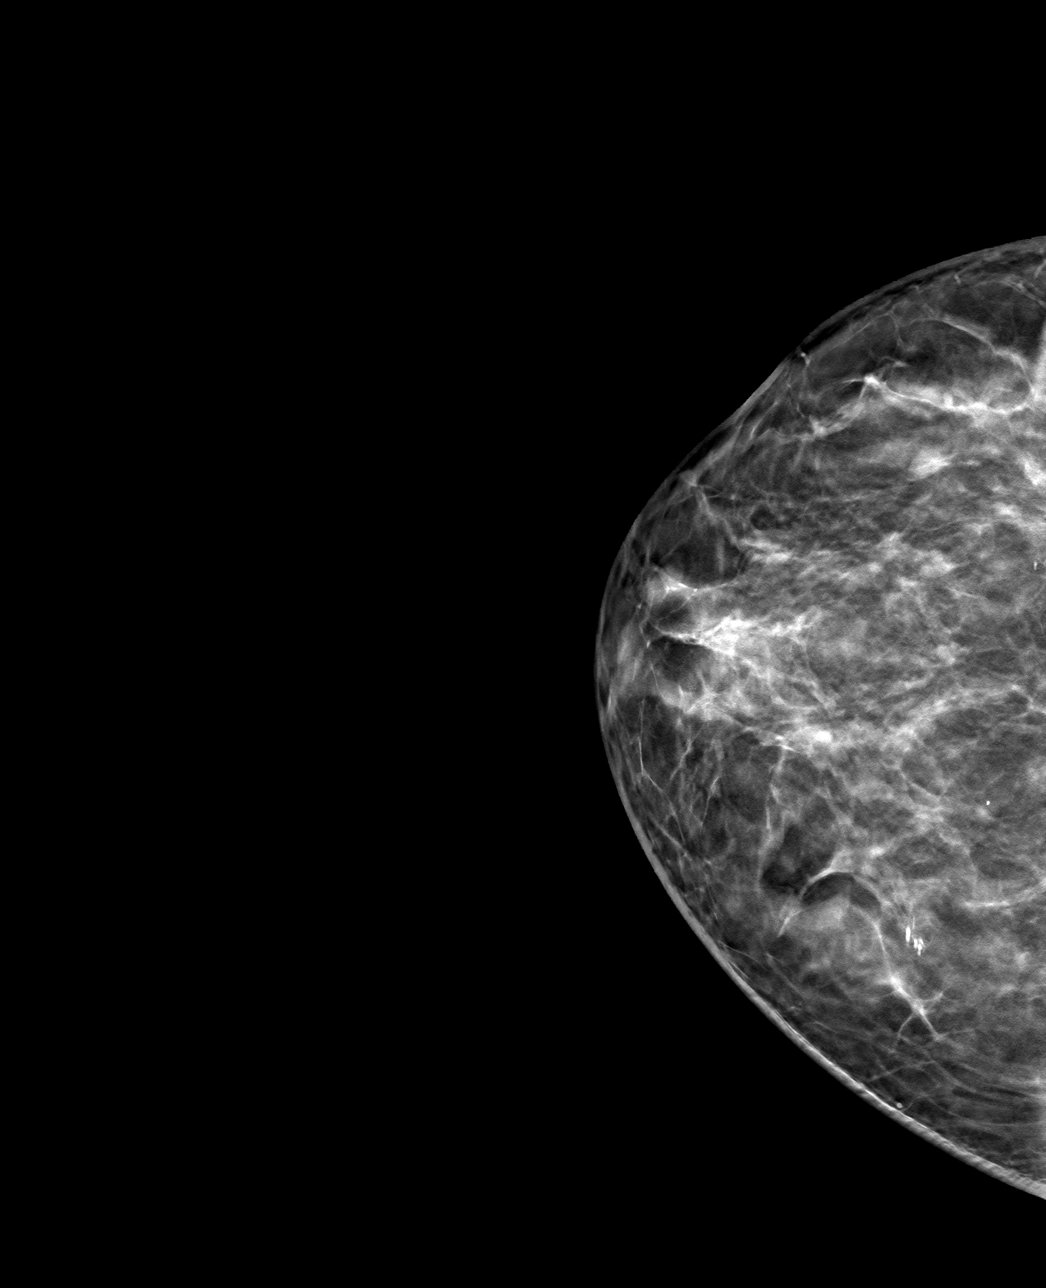

[4 of 12 positions shown; findings below may reference images not displayed]

FINDINGS: Mammographic images were obtained following MRI guided biopsy of an
indeterminate 8 mm mass within the upper inner quadrant of the RIGHT
breast. The biopsy marking clip is in expected position at the site
of biopsy. The clip is at the medial margin of the biopsy cavity,
displaced medially by approximately 1.7 cm.
IMPRESSION: Adequate positioning of the barbell shaped biopsy marking clip at
the site of biopsy in the upper inner quadrant of the RIGHT breast.
Clip does lie at the medial aspect of the biopsy cavity with
targeted mass more likely at the lateral margin of the biopsy
cavity, indicating approximately 1.7 cm medial displacement of the
clip. This displacement should decrease as the biopsy cavity
heals/decompresses.

Final Assessment: Post Procedure Mammograms for Marker Placement

## 2020-08-11 IMAGING — MR MR BREAST BX W/ LOC DEV 1ST LEASION IMAGE BX SPEC MR GUIDE*R*
6 of 8 series · 33 of 48 positions shown · IV contrast (10 ml Gadavist)
Comparison: Previous exams.
COMPARISON: Previous exams.

Addendum:
CLINICAL DATA: Recent diagnosis of LEFT breast invasive ductal
carcinoma and DCIS with additional metastatic carcinoma involving a
LEFT axillary lymph node.

Subsequent breast MRI dated [DATE] describes an additional
indeterminate enhancing mass within the upper inner RIGHT breast for
which MRI guided biopsy was recommended. Patient presents today for
the MRI-guided RIGHT breast biopsy.
EXAM:
MRI GUIDED CORE NEEDLE BIOPSY OF THE RIGHT BREAST
TECHNIQUE: Multiplanar, multisequence MR imaging of the RIGHT breast was
performed both before and after administration of intravenous
contrast.
CONTRAST:  10mL GADAVIST GADOBUTROL 1 MMOL/ML IV SOLN

[Series 3: fiducial unilateral · sagittal · 2.0mm · 1.33mm/px · 4 of 60 slices shown]
[im 1/60]
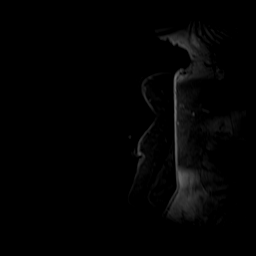
[im 20/60]
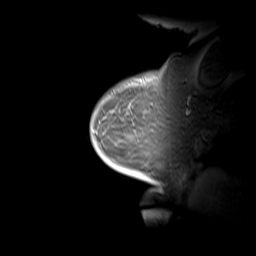
[im 40/60]
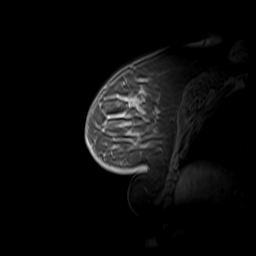
[im 60/60]
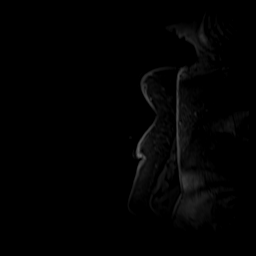

[Series 4: dynamic pre · axial · non-contrast · 1.3mm · 0.73mm/px · z∈[-80,+106]mm · 7 of 144 slices shown]
[im 1/144]
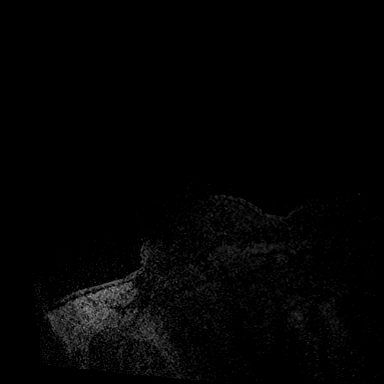
[im 24/144]
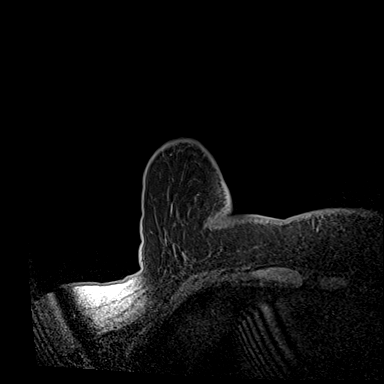
[im 48/144]
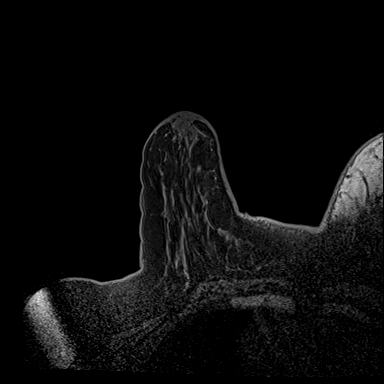
[im 72/144]
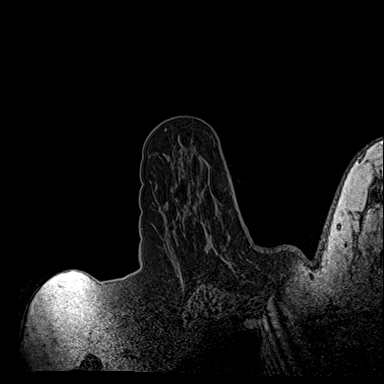
[im 96/144]
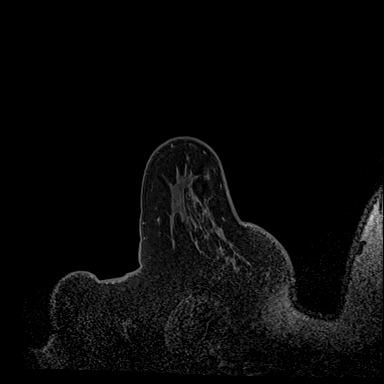
[im 120/144]
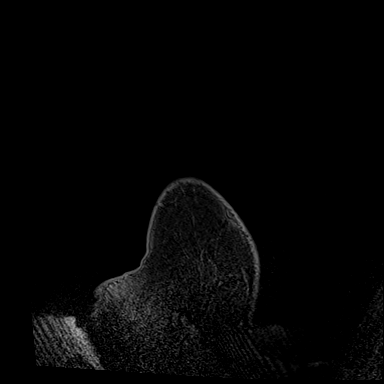
[im 144/144]
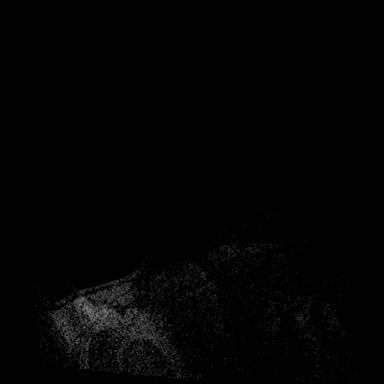

[Series 5: dynamic post 20 · axial · 1.3mm · 0.73mm/px · z∈[-80,+106]mm · 7 of 144 slices shown (1 of 2)]
[im 1/144]
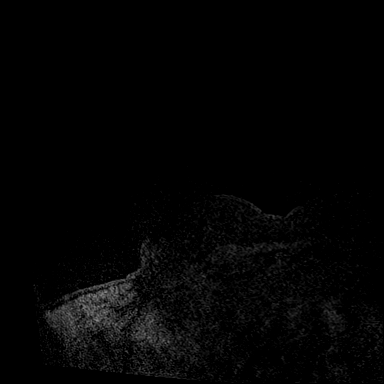
[im 24/144]
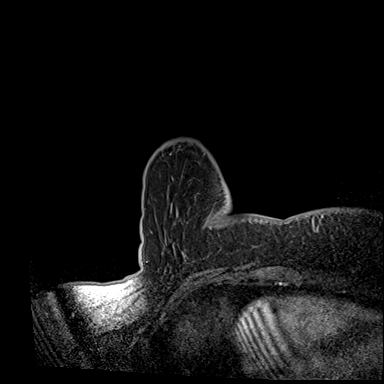
[im 48/144]
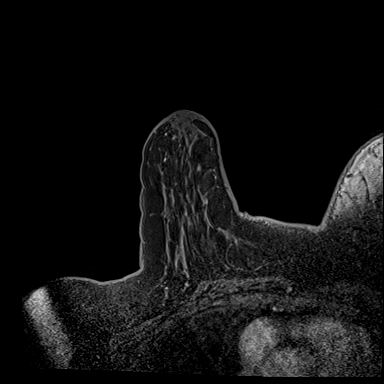
[im 72/144]
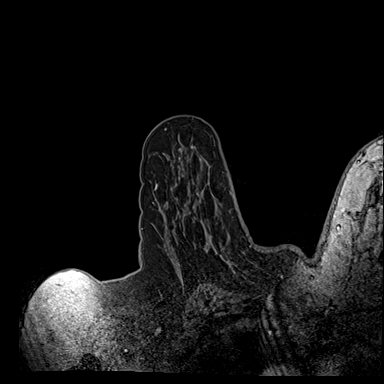
[im 96/144]
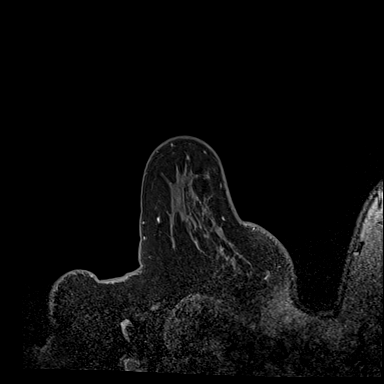
[im 120/144]
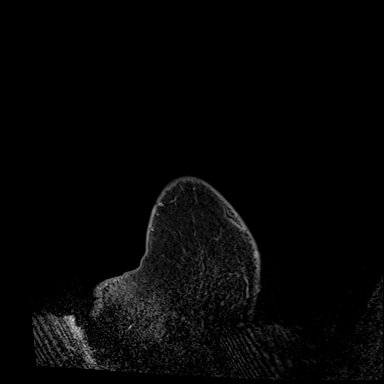
[im 144/144]
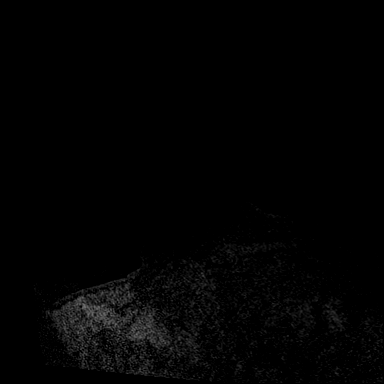

[Series 6: dynamic post 20 · axial · 1.3mm · 0.73mm/px · z∈[-80,+106]mm · 6 of 144 slices shown (2 of 2)]
[im 1/144]
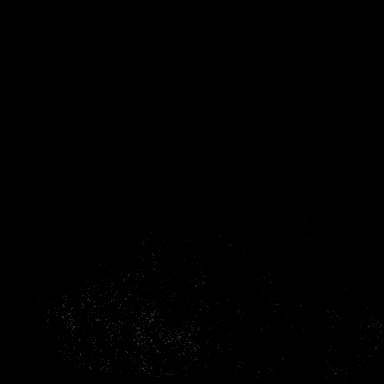
[im 29/144]
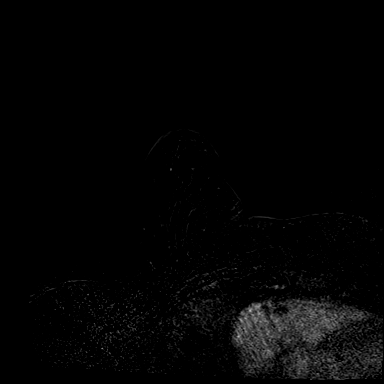
[im 58/144]
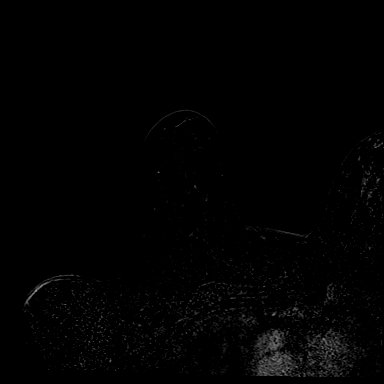
[im 86/144]
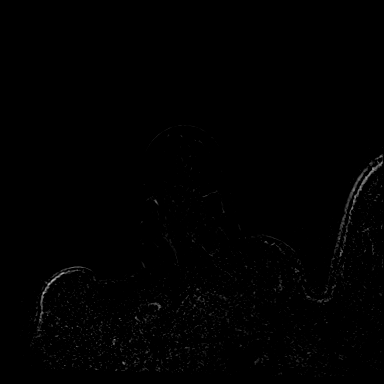
[im 115/144]
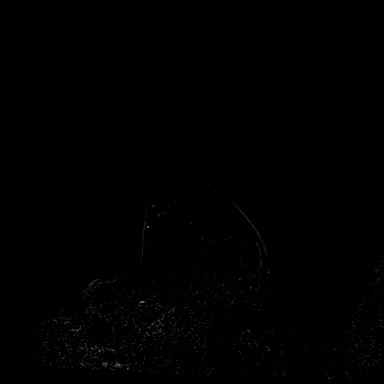
[im 144/144]
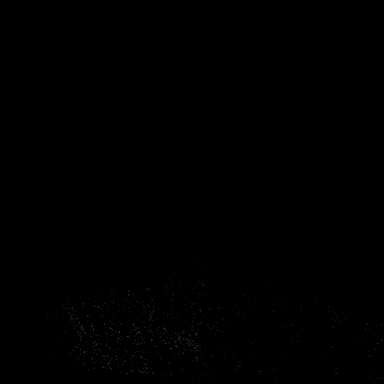

[Series 7: needle confirmation · axial · 1.3mm · 0.73mm/px · z∈[-80,+106]mm · 6 of 144 slices shown]
[im 1/144]
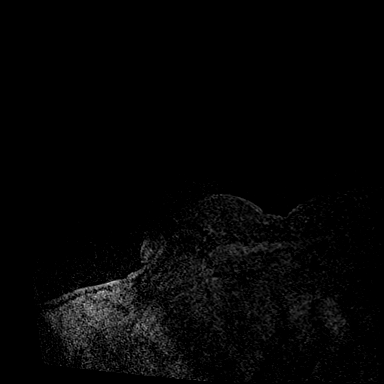
[im 29/144]
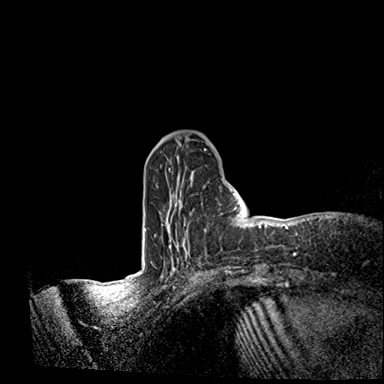
[im 58/144]
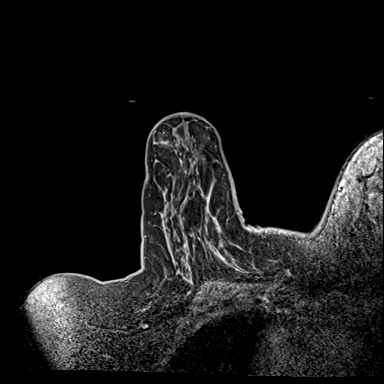
[im 86/144]
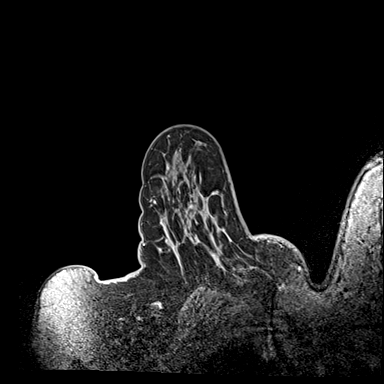
[im 115/144]
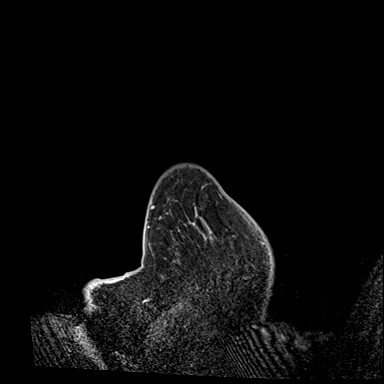
[im 144/144]
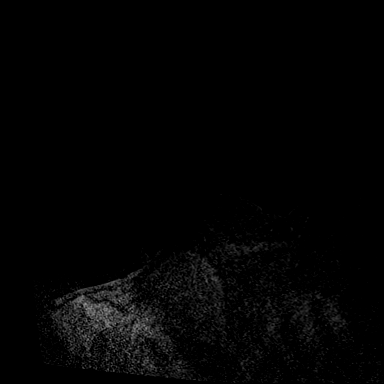

[Series 8: needle confirmation_sub · axial · 1.3mm · 0.73mm/px · z∈[-80,-6]mm · 3 of 144 slices shown]
[im 1/144]
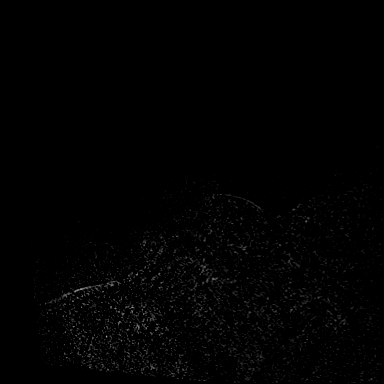
[im 29/144]
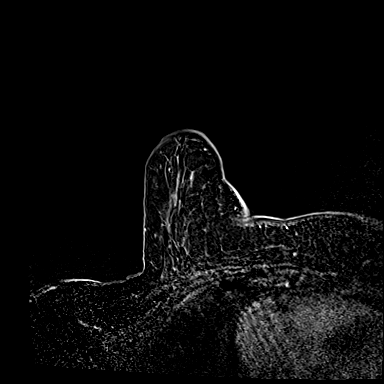
[im 58/144]
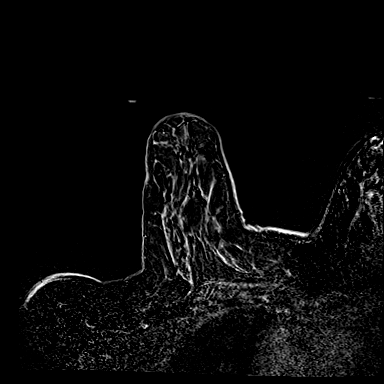

[33 of 48 positions shown; findings below may reference images not displayed]

FINDINGS: I met with the patient, and we discussed the procedure of MRI guided
biopsy, including risks, benefits, and alternatives. Specifically,
we discussed the risks of infection, bleeding, tissue injury, clip
migration, and inadequate sampling. Informed, written consent was
given. The usual time out protocol was performed immediately prior
to the procedure.

Using sterile technique, 1% Lidocaine, MRI guidance, and a 9 gauge
vacuum assisted device, biopsy was performed of the indeterminate 8
mm enhancing mass within the upper inner quadrant of the RIGHT
breast using a lateral approach. At the conclusion of the procedure,
a barbell shaped tissue marker clip was deployed into the biopsy
cavity. Follow-up 2-view mammogram was performed and dictated
separately.
IMPRESSION: MRI guided biopsy of an indeterminate 8 mm enhancing mass within the
upper inner quadrant of the RIGHT breast. No apparent complications.

ADDENDUM:
Pathology revealed PAPILLARY LESION of the RIGHT breast, upper inner
quadrant. Overall, this lesion is favored to be an intraductal
papilloma. This was found to be concordant by Dr. DUST, with
excision recommended.

Pathology results were discussed with the patient by telephone. The
patient reported doing well after the biopsy with tenderness at the
site. Post biopsy instructions and care were reviewed and questions
were answered. The patient was encouraged to call The [REDACTED] for any additional concerns. My direct phone
number was provided.

The patient has a recent diagnosis of LEFT breast cancer and should
follow her outlined treatment plan.

Pathology results reported by DUST, RN on [DATE].

*** End of Addendum ***
FINDINGS: I met with the patient, and we discussed the procedure of MRI guided
biopsy, including risks, benefits, and alternatives. Specifically,
we discussed the risks of infection, bleeding, tissue injury, clip
migration, and inadequate sampling. Informed, written consent was
given. The usual time out protocol was performed immediately prior
to the procedure.

Using sterile technique, 1% Lidocaine, MRI guidance, and a 9 gauge
vacuum assisted device, biopsy was performed of the indeterminate 8
mm enhancing mass within the upper inner quadrant of the RIGHT
breast using a lateral approach. At the conclusion of the procedure,
a barbell shaped tissue marker clip was deployed into the biopsy
cavity. Follow-up 2-view mammogram was performed and dictated
separately.
IMPRESSION: MRI guided biopsy of an indeterminate 8 mm enhancing mass within the
upper inner quadrant of the RIGHT breast. No apparent complications.

## 2020-08-11 MED ORDER — GADOBUTROL 1 MMOL/ML IV SOLN
10.0000 mL | Freq: Once | INTRAVENOUS | Status: AC | PRN
  Administered 2020-08-11: 10 mL via INTRAVENOUS

## 2020-08-11 MED ORDER — OXYCODONE HCL 5 MG PO TABS
5.0000 mg | ORAL_TABLET | Freq: Three times a day (TID) | ORAL | 0 refills | Status: DC | PRN
Start: 2020-08-11 — End: 2020-10-26

## 2020-08-11 NOTE — Progress Notes (Signed)
This patient was seen in infusion as she was receiving cycle 1 of Taxotere, Perjeta, and Herceptin.  She reported having a sensation that her legs were heavy and that she was short of breath.  Her vital signs returned showing a blood pressure of 142/68, oxygen saturation of 95%, pulse of 75, and temperature of 97.9.  A brief exam showed decreased breath sounds throughout her lungs.  She had been premedicated with Tylenol, Benadryl, and Decadron.  She was given an albuterol inhaler and was instructed to use 2 puffs.  She reports that she has used an inhaler in the past.  Her symptoms abated after using the albuterol inhaler.  She completed her infusion without any other issues of concern.  Sandi Mealy, MHS, PA-C Physician Assistant

## 2020-08-12 ENCOUNTER — Telehealth: Payer: Self-pay | Admitting: Hematology

## 2020-08-12 ENCOUNTER — Other Ambulatory Visit: Payer: Self-pay

## 2020-08-12 ENCOUNTER — Ambulatory Visit: Payer: Medicare HMO

## 2020-08-12 ENCOUNTER — Inpatient Hospital Stay: Payer: Medicare HMO

## 2020-08-12 VITALS — BP 158/78 | HR 62 | Temp 98.2°F | Resp 18

## 2020-08-12 DIAGNOSIS — Z170 Estrogen receptor positive status [ER+]: Secondary | ICD-10-CM

## 2020-08-12 DIAGNOSIS — C50412 Malignant neoplasm of upper-outer quadrant of left female breast: Secondary | ICD-10-CM

## 2020-08-12 DIAGNOSIS — Z5112 Encounter for antineoplastic immunotherapy: Secondary | ICD-10-CM | POA: Diagnosis not present

## 2020-08-12 MED ORDER — SODIUM CHLORIDE 0.9 % IV SOLN
Freq: Once | INTRAVENOUS | Status: AC
  Filled 2020-08-12: qty 250

## 2020-08-12 NOTE — Telephone Encounter (Signed)
Scheduled appt per 11/24 sch msg - pt is aware of appt date and time   

## 2020-08-12 NOTE — Patient Instructions (Signed)

## 2020-08-14 ENCOUNTER — Inpatient Hospital Stay: Payer: Medicare HMO

## 2020-08-14 ENCOUNTER — Other Ambulatory Visit: Payer: Self-pay

## 2020-08-14 ENCOUNTER — Telehealth: Payer: Self-pay | Admitting: Hematology

## 2020-08-14 VITALS — BP 155/70 | HR 72 | Temp 98.4°F | Resp 18

## 2020-08-14 DIAGNOSIS — Z5112 Encounter for antineoplastic immunotherapy: Secondary | ICD-10-CM | POA: Diagnosis not present

## 2020-08-14 DIAGNOSIS — Z170 Estrogen receptor positive status [ER+]: Secondary | ICD-10-CM

## 2020-08-14 DIAGNOSIS — C50412 Malignant neoplasm of upper-outer quadrant of left female breast: Secondary | ICD-10-CM

## 2020-08-14 MED ORDER — HEPARIN SOD (PORK) LOCK FLUSH 100 UNIT/ML IV SOLN
500.0000 [IU] | Freq: Once | INTRAVENOUS | Status: AC | PRN
  Administered 2020-08-14: 500 [IU]
  Filled 2020-08-14: qty 5

## 2020-08-14 MED ORDER — SODIUM CHLORIDE 0.9 % IV SOLN
Freq: Once | INTRAVENOUS | Status: AC
  Filled 2020-08-14: qty 250

## 2020-08-14 MED ORDER — SODIUM CHLORIDE 0.9% FLUSH
10.0000 mL | Freq: Once | INTRAVENOUS | Status: AC | PRN
  Administered 2020-08-14: 10 mL
  Filled 2020-08-14: qty 10

## 2020-08-14 NOTE — Patient Instructions (Signed)

## 2020-08-14 NOTE — Telephone Encounter (Signed)
No 11/23 los. No changes made to pt's schedule.

## 2020-08-17 NOTE — Progress Notes (Signed)
Cheryl Blankenship   [phone] [phone]   Clinic Follow up Note   Patient Care Team: [name], PA-C as PCP - General (Internal Medicine) [name], RN as Oncology Nurse Navigator [name], RN as Oncology Nurse Navigator [name], MD as Consulting Physician (General Surgery) [name], MD as Consulting Physician (Hematology) [name], MD as Consulting Physician (Radiation Oncology)  Date of Service:  08/18/2020  CHIEF COMPLAINT:  F/u of left breast cancer  SUMMARY OF ONCOLOGIC HISTORY: Oncology History Overview Note  Cancer Staging Malignant neoplasm of upper-outer quadrant of left breast in female, estrogen receptor positive ([place]) Staging form: Breast, AJCC 8th Edition - Clinical stage from 07/16/2020: Stage IB (cT2, cN1, cM0, G2, ER+, PR+, HER2+) - Signed by [name], MD on 07/21/2020    Malignant neoplasm of upper-outer quadrant of left breast in female, estrogen receptor positive ([place])  07/01/2020 Mammogram   IMPRESSION: 1. There is a 12 cm span of highly suspicious calcifications involving the upper outer and upper inner quadrants of the left breast. On ultrasound, within this area of calcifications, there are confluent masses at 1 o'clock spanning at least 4.5 cm. An additional suspicious mass is seen in the left breast at 2 o'clock.  -Ultrasound targeted to the left breast at 1 o'clock, 5 cm from the nipple demonstrates a broad area of hypoechoic irregular tissue/confluent masses spanning at least 4.5 cm. There is a separate oval hypoechoic mass with indistinct margins at 2 o'clock, 7 cm from the nipple measuring 0.9 x 0.4 x 0.9 cm. Ultrasound of the left axilla demonstrates 2 abnormally thickened lymph nodes with cortices measuring 6-7 mm. There is an additional lymph node with a borderline cortex of 4 mm.   2. There are 2 lymph nodes with thickened cortices in the left axilla, and an  additional lymph node with a borderline thickened cortex.   07/16/2020 Cancer Staging   Staging form: Breast, AJCC 8th Edition - Clinical stage from 07/16/2020: Stage IIA (cT3, cN1, cM0, G2, ER+, PR+, HER2+) - Signed by [name], MD on 08/05/2020   07/16/2020 Initial Biopsy   Diagnosis 1. 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 Breast, left, needle core biopsy, 1 o'clock - INVASIVE DUCTAL CARCINOMA - SEE COMMENT 3. Lymph node, biopsy - METASTATIC CARCINOMA INVOLVING A LYMPH NODE - SEE COMMENT Microscopic Comment 1. Based on the biopsy, the ductal carcinoma in situ has a comedo pattern, high nuclear grade and measures 0.7 cm in greatest linear extent. Prognostic markers (ER/PR) are pending and will be reported in an addendum. 2. Based on the biopsy, the carcinoma appears Nottingham grade 2 of 3 and measures 1.5 cm in greatest linear extent. Prognostic markers (ER/PR/ki-67/HER2) are pending and will be reported in an addendum. Dr. Saralyn Pilar reviewed the case and agrees with the above diagnosis. These results were called to [facility] on July 17, 2020. 3. Based on the biopsy, the carcinoma appears measures 1.0 cm in greatest linear extent. Prognostic markers (ER/PR/ki-67/HER2) are pending and will be reported in an addendum.   07/16/2020 Receptors her2   1. PROGNOSTIC INDICATORS Results: IMMUNOHISTOCHEMICAL AND MORPHOMETRIC ANALYSIS PERFORMED MANUALLY Estrogen Receptor: 30%, POSITIVE, STRONG STAINING INTENSITY Progesterone Receptor: 5%, POSITIVE, STRONG STAINING INTENSITY  2. PROGNOSTIC INDICATORS Results: IMMUNOHISTOCHEMICAL AND MORPHOMETRIC ANALYSIS PERFORMED MANUALLY The tumor cells are POSITIVE for Her2 (3+). Estrogen Receptor: 85%, POSITIVE, STRONG  STAINING INTENSITY Progesterone Receptor: 60%, POSITIVE, STRONG STAINING INTENSITY Proliferation  Marker Ki67: 25%  3. PROGNOSTIC INDICATORS Results: IMMUNOHISTOCHEMICAL AND MORPHOMETRIC ANALYSIS PERFORMED MANUALLY The tumor cells are POSITIVE for Her2 (3+). Estrogen Receptor: 95%, POSITIVE, STRONG STAINING INTENSITY Progesterone Receptor: 45%, POSITIVE, STRONG STAINING INTENSITY Proliferation Marker Ki67: 30%   07/20/2020 Initial Diagnosis   Malignant neoplasm of upper-outer quadrant of left breast in female, estrogen receptor positive ([place])   07/31/2020 Echocardiogram   Baseline Echo  IMPRESSIONS     1. Left ventricular ejection fraction, by estimation, is 65 to 70%. The  left ventricle has normal function. The left ventricle has no regional  wall motion abnormalities. Left ventricular diastolic parameters are  indeterminate. The average left  ventricular global longitudinal strain is -17.1 %.   2. Right ventricular systolic function is normal. The right ventricular  size is normal. There is normal pulmonary artery systolic pressure.   3. The mitral valve is normal in structure. Trivial mitral valve  regurgitation. No evidence of mitral stenosis.   4. The aortic valve is normal in structure. Aortic valve regurgitation is  not visualized. No aortic stenosis is present.    07/31/2020 Breast MRI   IMPRESSION: 1. Extensive non mass enhancement within the left breast extending from the nipple to the chest wall. Overall findings are most compatible with extensive malignancy involving the majority of the left breast. 2. Indeterminate enhancing mass within the upper inner right breast. 3. Three morphologically abnormal lymph nodes within the left axilla. One of these nodes has been biopsied compatible with metastatic adenopathy.     08/03/2020 Imaging   Bone Scan whole body  IMPRESSION: Increased radiotracer uptake in the mid to lower lumbar regions of uncertain etiology. Correlation with radiography to assess for potential arthropathy in these areas advised. Increased  uptake in major joints is likely of arthropathic etiology. Increased uptake in the mid face is likely due to paranasal sinus disease. Distribution of radiotracer uptake in bony structures elsewhere unremarkable.   08/04/2020 Procedure   PAC placement by Dr [name]   08/05/2020 -  Neo-Adjuvant Anti-estrogen oral therapy   Neoadjuvant THP q3weeks for 4-6 cycles starting 08/05/20, followed by Herceptin or Kadcyla q3weeks to complete 1 year of treatment.        -Due to very poor toleration after C1 THP, I switched her to Kadcyla and Perjeta q3weeks starting 08/25/20.   08/07/2020 Imaging   CT CAP  IMPRESSION: 2.7 cm mass in the upper outer left breast, likely corresponding to the patient's known primary breast neoplasm.   8 mm short axis left axillary node, suspicious for nodal metastasis in this clinical context.   No findings suspicious for distant metastasis.   Endometrium is mildly prominent, measuring 11 mm. Correlate for vaginal bleeding and consider pelvic ultrasound for further evaluation if clinically warranted.   08/11/2020 Pathology Results   Diagnosis Breast, right, needle core biopsy, right breast - PAPILLARY LESION - SEE COMMENT Microscopic Comment The biopsy consists of a papillary lesion with a focal area of adenosis. Immunohistochemistry (SMM, calponin and p63) shows retention of the myoepithelial cell layer in the focus of adenosis. 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 These results were called to [facility] on August 12, 2020.   08/25/2020 -  Chemotherapy   The patient had pertuzumab (PERJETA) 420 mg in sodium chloride 0.9 % 250 mL chemo infusion, 420 mg (100 % of original dose 420 mg), Intravenous, Once, 0 of 5 cycles  Dose modification: 420 mg (original dose 420 mg, Cycle 1) ado-trastuzumab emtansine (KADCYLA) 360 mg in sodium chloride 0.9 % 250 mL chemo infusion, 3.6 mg/kg = 360 mg, Intravenous, Once, 0 of 5  cycles  for chemotherapy treatment.       CURRENT THERAPY:  Neoadjuvant THP q3weeks for 4-6 cycles starting 08/05/20. Due to very poor toleration, I switched her to Kadcyla and Perjeta q3weeks starting 08/25/20.  INTERVAL HISTORY:  Cheryl Blankenship is here for a follow up. She presents to the clinic with her daughter. Her son was called to be included in the visit. The patient notes she is feeling much better. She notes she still has diarrhea. She had 2 episodes yesterday. She took imodium and lomotil. She notes IV fluids did help her which eased her pain. She notes she was sick with N&V and soreness 2-3 days ago.    REVIEW OF SYSTEMS:   Constitutional: Denies fevers, chills or abnormal weight loss Eyes: Denies blurriness of vision Ears, nose, mouth, throat, and face: Denies mucositis or sore throat Respiratory: Denies cough, dyspnea or wheezes Cardiovascular: Denies palpitation, chest discomfort (+) lower extremity swelling Gastrointestinal:  Denies nausea, heartburn (+) Diarrhea much improved.  Skin: Denies abnormal skin rashes Lymphatics: Denies new lymphadenopathy or easy bruising Neurological:Denies numbness, tingling or new weaknesses Behavioral/Psych: Mood is stable, no new changes  All other systems were reviewed with the patient and are negative.  MEDICAL HISTORY:  Past Medical History:  Diagnosis Date  . Anxiety   . Breast cancer ([place])   . CHF (congestive heart failure) ([place])   . Diabetes mellitus without complication ([place])    type 2  . Hypertension     SURGICAL HISTORY: Past Surgical History:  Procedure Laterality Date  . APPENDECTOMY    . CHOLECYSTECTOMY    . COLONOSCOPY    . EYE SURGERY     cataracts  . MULTIPLE TOOTH EXTRACTIONS    . PORTACATH PLACEMENT N/A 08/04/2020   Procedure: INSERTION PORT-A-CATH WITH ULTRASOUND GUIDANCE;  Surgeon: [name], MD;  Location: [facility];  Service: General;  Laterality: N/A;  . STOMACH SURGERY      I have reviewed  the social history and family history with the patient and they are unchanged from previous note.  ALLERGIES:  is allergic to hydrochlorothiazide, lisinopril, and coconut oil.  MEDICATIONS:  Current Outpatient Medications  Medication Sig Dispense Refill  . Cholecalciferol 25 MCG (1000 UT) capsule Take 1,000 Units by mouth daily.    . diphenoxylate-atropine (LOMOTIL) 2.5-0.025 MG tablet Take 1-2 tablets by mouth 4 (four) times daily as needed for diarrhea or loose stools. 30 tablet 0  . DULoxetine (CYMBALTA) 30 MG capsule Take 30 mg by mouth daily.    . empagliflozin (JARDIANCE) 25 MG TABS tablet Take 25 mg by mouth daily.    . fluorometholone (FML) 0.1 % ophthalmic suspension Place 1 drop into both eyes daily.    . folic acid (FOLVITE) 1 MG tablet Take 1 tablet by mouth daily.    . furosemide (LASIX) 40 MG tablet Take 40 mg by mouth daily.    Marland Kitchen gabapentin (NEURONTIN) 400 MG capsule Take 400 mg by mouth 3 (three) times daily.     Marland Kitchen glipiZIDE (GLUCOTROL XL) 5 MG 24 hr tablet Take 5 mg by mouth daily with breakfast.     . HYDROcodone-acetaminophen (NORCO/VICODIN) 5-325 MG tablet Take 1 tablet by mouth every 6 (six) hours as needed for moderate pain. 15 tablet 0  . insulin glargine (  LANTUS SOLOSTAR) 100 UNIT/ML Solostar Pen Inject 70 Units into the skin 2 (two) times daily.     . insulin regular (NOVOLIN R) 100 units/mL injection Inject 0-22 Units into the skin 3 (three) times daily before meals. Dose per sliding scale    . losartan (COZAAR) 100 MG tablet Take 100 mg by mouth daily.    . metoprolol tartrate (LOPRESSOR) 50 MG tablet Take 50 mg by mouth in the morning and at bedtime.    . Omega-3 Fatty Acids (FISH OIL) 1000 MG CAPS Take 1,000 mg by mouth daily.    . ondansetron (ZOFRAN-ODT) 4 MG disintegrating tablet Take 4 mg by mouth 2 (two) times daily as needed for nausea or vomiting.     Marland Kitchen oxyCODONE (OXY IR/ROXICODONE) 5 MG immediate release tablet Take 1-2 tablets (5-10 mg total) by mouth  every 8 (eight) hours as needed for severe pain. 15 tablet 0  . potassium chloride (KLOR-CON) 10 MEQ tablet Take 20 mEq by mouth daily. DAILY WITH LASIX    . pregabalin (LYRICA) 75 MG capsule Take 75 mg by mouth at bedtime.     . RESTASIS 0.05 % ophthalmic emulsion Place 1 drop into both eyes 2 (two) times daily.    . rosuvastatin (CRESTOR) 5 MG tablet Take 5 mg by mouth at bedtime.    Marland Kitchen tiZANidine (ZANAFLEX) 4 MG tablet Take 4 mg by mouth 2 (two) times daily as needed for muscle spasms.     . vitamin E (VITAMIN E) 180 MG (400 UNITS) capsule Take 400 Units by mouth daily.    . Accu-Chek FastClix Lancets MISC     . amLODipine (NORVASC) 10 MG tablet Take 10 mg by mouth daily.    Marland Kitchen aspirin 81 MG chewable tablet Chew 81 mg by mouth daily. (Patient not taking: Reported on 08/18/2020)    . metFORMIN (GLUCOPHAGE) 1000 MG tablet Take 1,000 mg by mouth daily with supper.  (Patient not taking: Reported on 08/18/2020)    . metFORMIN (GLUCOPHAGE) 500 MG tablet Take 500 mg by mouth daily with breakfast. (Patient not taking: Reported on 08/18/2020)    . NOVOLOG FLEXPEN 100 UNIT/ML FlexPen      Current Facility-Administered Medications  Medication Dose Route Frequency Provider Last Rate Last Admin  . 0.9 %  sodium chloride infusion   Intravenous Continuous [name], MD 375 mL/hr at 08/18/20 1219 New Bag at 08/18/20 1219    PHYSICAL EXAMINATION: ECOG PERFORMANCE STATUS: 2 - Symptomatic, <50% confined to bed  Vitals:   08/18/20 1142  BP: 133/68  Pulse: 79  Resp: 18  Temp: 97.9 F (36.6 C)  SpO2: 96%   Filed Weights   08/18/20 1142  Weight: 221 lb 14.4 oz (100.7 kg)    GENERAL:alert, no distress and comfortable SKIN: skin color, texture, turgor are normal, no rashes or significant lesions EYES: normal, Conjunctiva are pink and non-injected, sclera clear  NECK: supple, thyroid normal size, non-tender, without nodularity LYMPH:  no palpable lymphadenopathy in the cervical, axillary  LUNGS:  clear to auscultation and percussion with normal breathing effort HEART: regular rate & rhythm and no murmurs (+) Pitting edema, L>R, with b/l tenderness on palpation ABDOMEN:abdomen soft, non-tender and normal bowel sounds Musculoskeletal:no cyanosis of digits and no clubbing  NEURO: alert & oriented x 3 with fluent speech, no focal motor/sensory deficits  LABORATORY DATA:  I have reviewed the data as listed CBC Latest Ref Rng & Units 08/18/2020 08/11/2020 08/05/2020  WBC 4.0 - 10.5 K/uL 3.8(L) 3.8(L)  5.6  Hemoglobin 12.0 - 15.0 g/dL 10.2(L) 11.8(L) 11.3(L)  Hematocrit 36 - 46 % 32.1(L) 37.0 35.1(L)  Platelets 150 - 400 K/uL 261 211 224     CMP Latest Ref Rng & Units 08/18/2020 08/11/2020 08/05/2020  Glucose 70 - 99 mg/dL 229(H) 233(H) 330(H)  BUN 8 - 23 mg/dL _0 Creatinine 0.44 - 1.00 mg/dL 1.23(H) 1.03(H) 1.01(H)  Sodium 135 - 145 mmol/L 139 141 139  Potassium 3.5 - 5.1 mmol/L 3.6 3.7 4.1  Chloride 98 - 111 mmol/L 104 102 106  CO2 22 - 32 mmol/L _1 Calcium 8.9 - 10.3 mg/dL 10.1 10.3 9.6  Total Protein 6.5 - 8.1 g/dL 6.4(L) 7.1 6.1(L)  Total Bilirubin 0.3 - 1.2 mg/dL 0.5 0.5 0.3  Alkaline Phos 38 - 126 U/L 60 65 67  AST 15 - 41 U/L 13(L) 14(L) 10(L)  ALT 0 - 44 U/L _2 RADIOGRAPHIC STUDIES: I have personally reviewed the radiological images as listed and agreed with the findings in the report. No results found.   ASSESSMENT & PLAN:  Katriona Schmierer is a 69 y.o. female with    1.Malignant neoplasm of upper-outer quadrant of left breast, StageII, c(T2N1M0), ER+/PR+/HER2+, GradeII -She was diagnosed in 06/2020.She has 12cm calcification and 4.5cm mass in her left breast with2 abnormalLN. 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 -Her MRI Breast from 07/31/20 shows very extensive disease in her left breast and 3 abnormal LNs in the left axilla.  There isindeterminate enhancing34mmass within the upper inner right breast. -Her 08/11/20 right breast biopsy showed benign papillary lesion. -Her bone scan from 08/03/20 shows uptake in her lumbar spine, but nonspecific. She has known lumbar back pain from arthritis. Her CT CAP from 08/07/20 showed known breast masses and left axillary LN and no distant metastasis. She has mild thickening of endometrium, pt denies vaginal bleeding. -Given theextensivedisease in her left breast,shewillneedmastectomy with Dr TCindra Evesthe only way to cure her cancer. -She started neoadjuvant chemo THP q3weeks on 08/05/20. She was dose reduced for first cycle, but still tolerated poorly with severe diarrhea, general body pain, fatigue. She has been able to maintain weight.  -With pain medication, anti-diarrheal and IV Fluids she has much improved and able to gain weight. Labs reviewed, WBC 3.8, Hg 10.2, ANC 1.1 from chemo, BG 229, Cr 1.23, protein 6.4, albumin 3.2.  -Given her poor toleration I recommend stopping THP and changing to Kadcyla with Perjeta q3weeks (base on KRISTINE trial data) to complete 6 cycles which is more tolerable. Based on her diarrhea, may stop Perjeta if she still has significant diarrhea. I reviewed side effects with her today which should be less than THP. She is agreeable.  -the goal of therapy is curative  -f/u next week with switch in regimen.    2. Symptoms Management: General body pain, Diarrhea, N&V   -With first week on THP she had general body pain, mainly in legs, shoulders and neck, diarrhea, N&V, Fatigue.   -For pain Norco was not enough so I started her on Oxycodone up to 2 tablets q8hours (08/11/20).  -For diarrhea I counseled her on use of Imodium and Lomotil. Her diarrhea has improved to twice loose stools daily. Will continue medication -She has taste change and N&V. She has antiemetics which I encouraged her to use.  -She was given IV Fluids 1-2 times in  the  past week. With pain medication and anti-diarrheals she has much improved and able to gain weight.  -Will change THP to Kadcyla and Perjeta for better tolerance.    3. Comorbidities: CHF,poorly controlledDM with neuropathy, HTN, Arthritis in hips, Elevated Cr  -Due to Nausea and stomach pain from Jardiance, she takes Zofran as needed.  -Continue medications and f/u with PCP and Cardiologist Dr. Marisue Ivan.  -She had 2 falls in 2021 due to multiple medications and poor sleep. She ambulates with cane.  -Pt is on Lasix which she notes leads to urinary incontinence. She wears depends for this. -Her baseline ECHO from 07/31/20 was normal. Will monitor on Herceptin.  -To help with DM management on chemo, I previously referred her to [facility]. I strongly encouraged her to f/u with her PCP in 1-2 weeks.  -Cr increased further to 1.23 today (08/18/20). Will give more IV Fluids today.  -She does have increase LE edema with mild tenderness. I will obtain Doppler to rule out blood clots. I suspect this is related to her IV fluids. I recommend elevated feet and using compression socks.   4. Genetic testing  -Given her mild family history of cancer (mother with Ovarian, niece and 2 cousins with cancer) she is eligible for genetic testing. She is agreeable to proceed.   5. Social Support -She does have decreased memory, she is on medication. Her family help her with history. -[place] in [place] in an apartmentwith other older women. Sheis currently able to take care of herself independently. Her daughter is in town and her son is in [place]. She has other non-biological children.  -She plan to have care given at home soon.She has Medicare and Medicaid.  6. Leg edema, L>R -developed since last week, likely related to IVF and low protein level -will get Doppler showed out DVT  PLAN: -B/l doppler of lower extremity today  -IV Fluids 762m over 2 hours today  -Lab, flush, F/u and  Kadcyla and Perjeta next week   No problem-specific Assessment & Plan notes found for this encounter.   No orders of the defined types were placed in this encounter.  All questions were answered. The patient knows to call the clinic with any problems, questions or concerns. No barriers to learning was detected. The total time spent in the appointment was 40 minutes.     YTruitt Merle MD 08/18/2020   I, AJoslyn Devon am acting as scribe for YTruitt Merle MD.   I have reviewed the above documentation for accuracy and completeness, and I agree with the above.

## 2020-08-18 ENCOUNTER — Other Ambulatory Visit: Payer: Self-pay

## 2020-08-18 ENCOUNTER — Inpatient Hospital Stay (HOSPITAL_BASED_OUTPATIENT_CLINIC_OR_DEPARTMENT_OTHER): Payer: Medicare HMO | Admitting: Hematology

## 2020-08-18 ENCOUNTER — Inpatient Hospital Stay: Payer: Medicare HMO

## 2020-08-18 ENCOUNTER — Encounter: Payer: Self-pay | Admitting: Hematology

## 2020-08-18 VITALS — BP 133/68 | HR 79 | Temp 97.9°F | Resp 18 | Ht 63.0 in | Wt 221.9 lb

## 2020-08-18 DIAGNOSIS — C50412 Malignant neoplasm of upper-outer quadrant of left female breast: Secondary | ICD-10-CM

## 2020-08-18 DIAGNOSIS — Z170 Estrogen receptor positive status [ER+]: Secondary | ICD-10-CM

## 2020-08-18 DIAGNOSIS — Z5112 Encounter for antineoplastic immunotherapy: Secondary | ICD-10-CM | POA: Diagnosis not present

## 2020-08-18 LAB — CMP (CANCER CENTER ONLY)
ALT: 14 U/L (ref 0–44)
AST: 13 U/L — ABNORMAL LOW (ref 15–41)
Albumin: 3.2 g/dL — ABNORMAL LOW (ref 3.5–5.0)
Alkaline Phosphatase: 60 U/L (ref 38–126)
Anion gap: 7 (ref 5–15)
BUN: 21 mg/dL (ref 8–23)
CO2: 28 mmol/L (ref 22–32)
Calcium: 10.1 mg/dL (ref 8.9–10.3)
Chloride: 104 mmol/L (ref 98–111)
Creatinine: 1.23 mg/dL — ABNORMAL HIGH (ref 0.44–1.00)
GFR, Estimated: 48 mL/min — ABNORMAL LOW (ref >60)
Glucose, Bld: 229 mg/dL — ABNORMAL HIGH (ref 70–99)
Potassium: 3.6 mmol/L (ref 3.5–5.1)
Sodium: 139 mmol/L (ref 135–145)
Total Bilirubin: 0.5 mg/dL (ref 0.3–1.2)
Total Protein: 6.4 g/dL — ABNORMAL LOW (ref 6.5–8.1)

## 2020-08-18 LAB — CBC WITH DIFFERENTIAL (CANCER CENTER ONLY)
Abs Immature Granulocytes: 0.05 10*3/uL (ref 0.00–0.07)
Basophils Absolute: 0 10*3/uL (ref 0.0–0.1)
Basophils Relative: 1 %
Eosinophils Absolute: 0 10*3/uL (ref 0.0–0.5)
Eosinophils Relative: 1 %
HCT: 32.1 % — ABNORMAL LOW (ref 36.0–46.0)
Hemoglobin: 10.2 g/dL — ABNORMAL LOW (ref 12.0–15.0)
Immature Granulocytes: 1 %
Lymphocytes Relative: 52 %
Lymphs Abs: 2 10*3/uL (ref 0.7–4.0)
MCH: 28 pg (ref 26.0–34.0)
MCHC: 31.8 g/dL (ref 30.0–36.0)
MCV: 88.2 fL (ref 80.0–100.0)
Monocytes Absolute: 0.6 10*3/uL (ref 0.1–1.0)
Monocytes Relative: 17 %
Neutro Abs: 1.1 10*3/uL — ABNORMAL LOW (ref 1.7–7.7)
Neutrophils Relative %: 28 %
Platelet Count: 261 10*3/uL (ref 150–400)
RBC: 3.64 MIL/uL — ABNORMAL LOW (ref 3.87–5.11)
RDW: 13.2 % (ref 11.5–15.5)
WBC Count: 3.8 10*3/uL — ABNORMAL LOW (ref 4.0–10.5)
nRBC: 0.8 % — ABNORMAL HIGH (ref 0.0–0.2)

## 2020-08-18 MED ORDER — SODIUM CHLORIDE 0.9 % IV SOLN
INTRAVENOUS | Status: AC
  Filled 2020-08-18: qty 250

## 2020-08-18 MED ORDER — HEPARIN SOD (PORK) LOCK FLUSH 100 UNIT/ML IV SOLN
500.0000 [IU] | Freq: Once | INTRAVENOUS | Status: AC | PRN
  Administered 2020-08-18: 500 [IU]
  Filled 2020-08-18: qty 5

## 2020-08-18 MED ORDER — SODIUM CHLORIDE 0.9% FLUSH
10.0000 mL | Freq: Once | INTRAVENOUS | Status: AC | PRN
  Administered 2020-08-18: 10 mL
  Filled 2020-08-18: qty 10

## 2020-08-18 NOTE — Progress Notes (Signed)
Pt stable for d/c, escorted via w/c with cane/belongings to exit with relative, transport services taking pt home, pt aware of [place] appt tomorrow at [facility]

## 2020-08-18 NOTE — Progress Notes (Signed)
DISCONTINUE OFF PATHWAY REGIMEN - Breast   [id number]:THP (Docetaxel 75 mg/m2 IV D1 + Trastuzumab 8/6 mg/kg IV D1 + Pertuzumab 840/420 mg IV D1) q21 Days:   Cycle 1: A cycle is 21 days:     Pertuzumab      Trastuzumab-xxxx      Docetaxel    Cycles 2 and beyond: A cycle is every 21 days:     Pertuzumab      Trastuzumab-xxxx      Docetaxel   **Always confirm dose/schedule in your pharmacy ordering system**  REASON: Toxicities / Adverse Event PRIOR TREATMENT: Off Pathway: THP (Docetaxel 75 mg/m2 IV D1 + Trastuzumab 8/6 mg/kg IV D1 + Pertuzumab 840/420 mg IV D1) q21 Days TREATMENT RESPONSE: Unable to Evaluate  START OFF PATHWAY REGIMEN - Breast   [id number]:Ado-Trastuzumab Emtansine 3.6 mg/kg IV D1 q21 Days:   A cycle is every 21 days:     Ado-trastuzumab emtansine   **Always confirm dose/schedule in your pharmacy ordering system**  Patient Characteristics: Preoperative or Nonsurgical Candidate (Clinical Staging), Neoadjuvant Therapy followed by Surgery, Invasive Disease, Chemotherapy, HER2 Positive, ER Positive Therapeutic Status: Preoperative or Nonsurgical Candidate (Clinical Staging) AJCC M Category: cM0 AJCC Grade: G2 Breast Surgical Plan: Neoadjuvant Therapy followed by Surgery ER Status: Positive (+) AJCC 8 Stage Grouping: IIA HER2 Status: Positive (+) AJCC T Category: cT3 AJCC N Category: cN1 PR Status: Positive (+) Intent of Therapy: Curative Intent, Discussed with Patient

## 2020-08-18 NOTE — Patient Instructions (Signed)

## 2020-08-19 ENCOUNTER — Ambulatory Visit (HOSPITAL_COMMUNITY)
Admission: RE | Admit: 2020-08-19 | Discharge: 2020-08-19 | Disposition: A | Discharge status: Home / Self Care | Payer: Medicare HMO | Source: Ambulatory Visit | Attending: Hematology | Admitting: Hematology

## 2020-08-19 DIAGNOSIS — Z170 Estrogen receptor positive status [ER+]: Secondary | ICD-10-CM | POA: Insufficient documentation

## 2020-08-19 DIAGNOSIS — C50412 Malignant neoplasm of upper-outer quadrant of left female breast: Secondary | ICD-10-CM | POA: Insufficient documentation

## 2020-08-19 DIAGNOSIS — R600 Localized edema: Secondary | ICD-10-CM

## 2020-08-19 NOTE — Progress Notes (Signed)
Left lower extremity venous duplex completed. Refer to "CV Proc" under chart review to view preliminary results.  08/19/2020 3:08 PM Kelby Aline., MHA, RVT, RDCS, RDMS

## 2020-08-20 ENCOUNTER — Telehealth: Payer: Self-pay | Admitting: Hematology

## 2020-08-20 NOTE — Telephone Encounter (Signed)
No 11/30 los. No changes made to pt's schedule

## 2020-08-24 ENCOUNTER — Other Ambulatory Visit: Payer: Self-pay | Admitting: Student

## 2020-08-24 ENCOUNTER — Telehealth: Payer: Self-pay

## 2020-08-24 ENCOUNTER — Ambulatory Visit
Admission: RE | Admit: 2020-08-24 | Discharge: 2020-08-24 | Disposition: A | Discharge status: Home / Self Care | Payer: Medicare HMO | Source: Ambulatory Visit | Attending: Student | Admitting: Student

## 2020-08-24 DIAGNOSIS — R062 Wheezing: Secondary | ICD-10-CM

## 2020-08-24 DIAGNOSIS — R0602 Shortness of breath: Secondary | ICD-10-CM

## 2020-08-24 IMAGING — CR DG CHEST 2V
2 series · 2 of 2 positions shown · non-contrast
Comparison: CT [DATE].  Chest x-ray [DATE].

CLINICAL DATA: Wheezing.  Shortness of breath.

EXAM:
CHEST - 2 VIEW

[w chest pa]
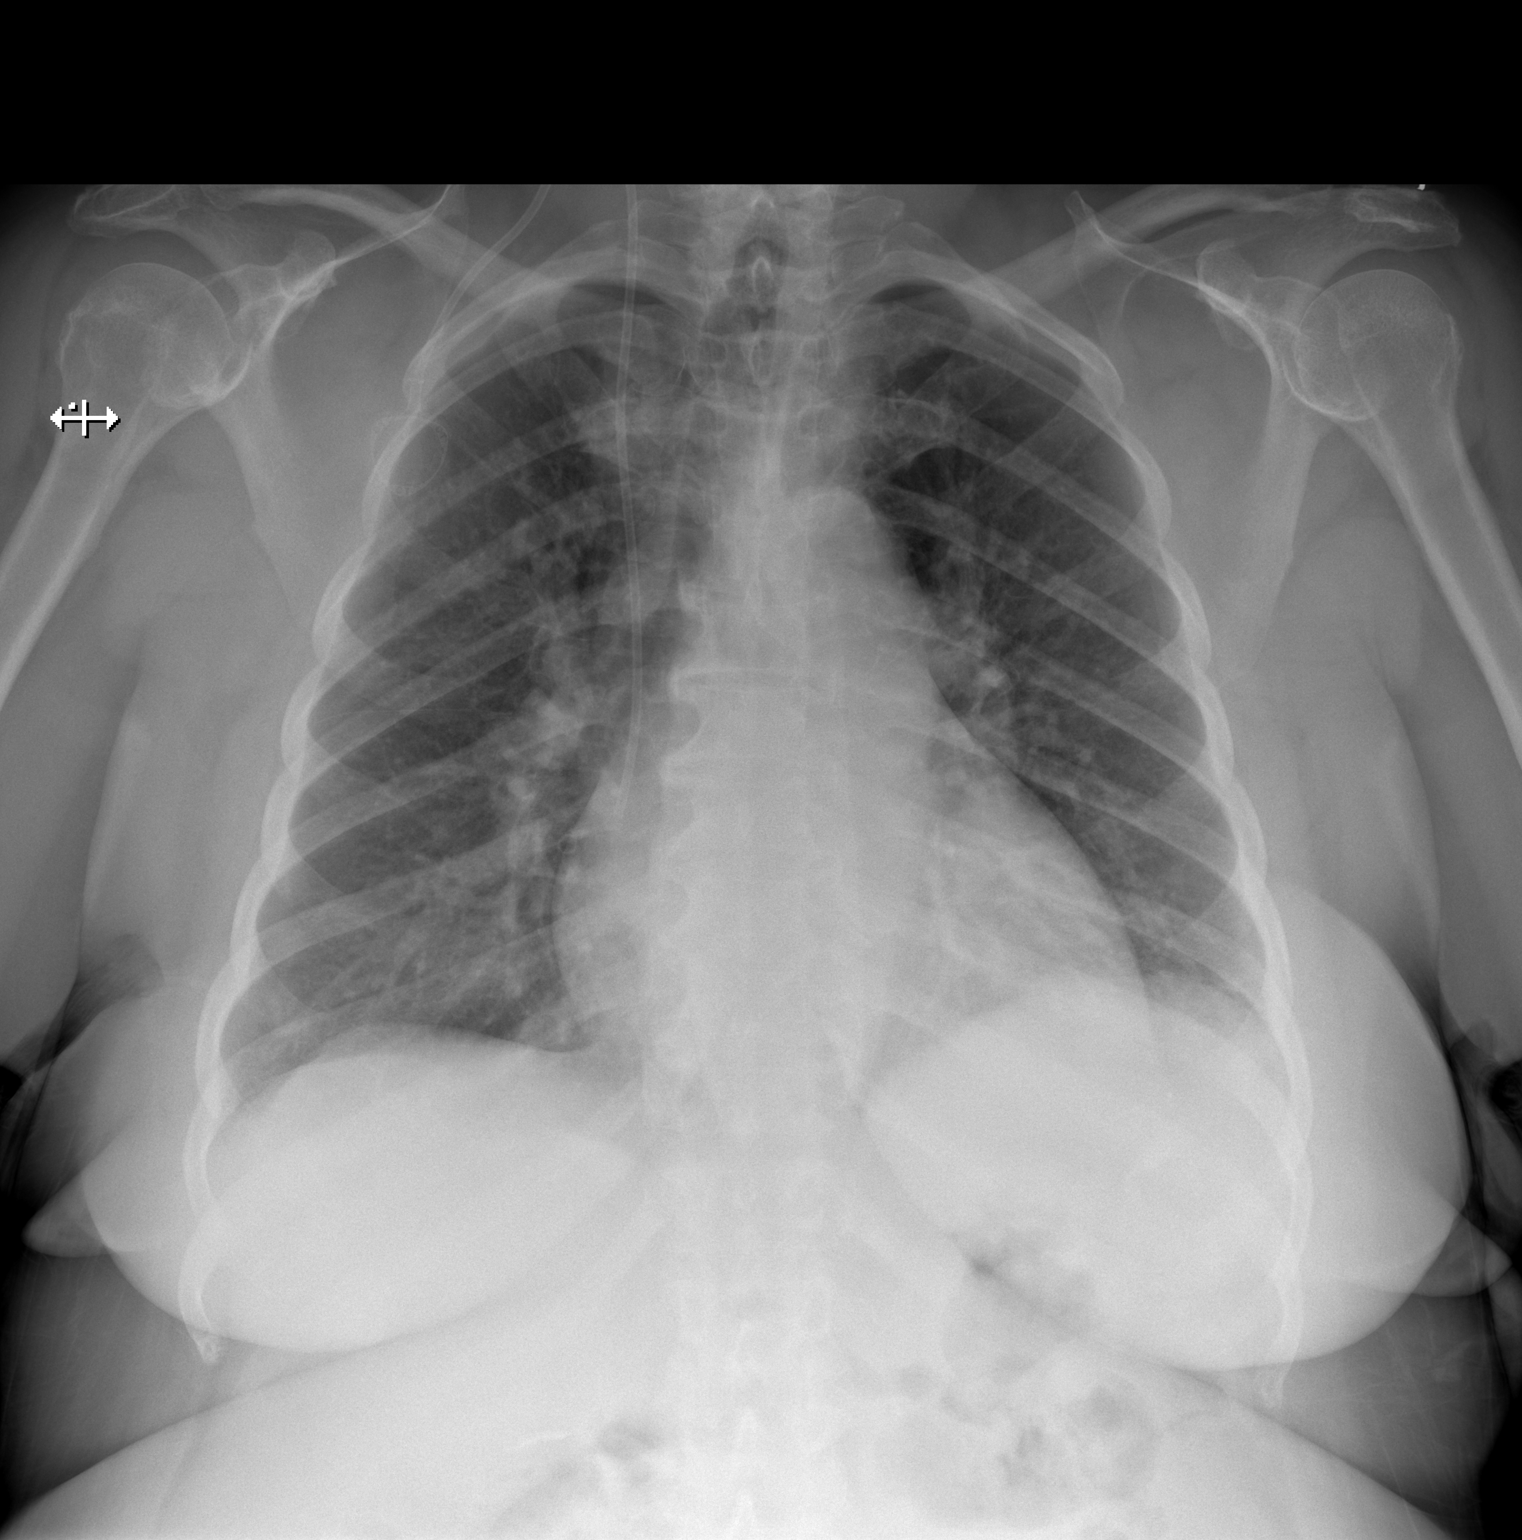

[w chest lat]
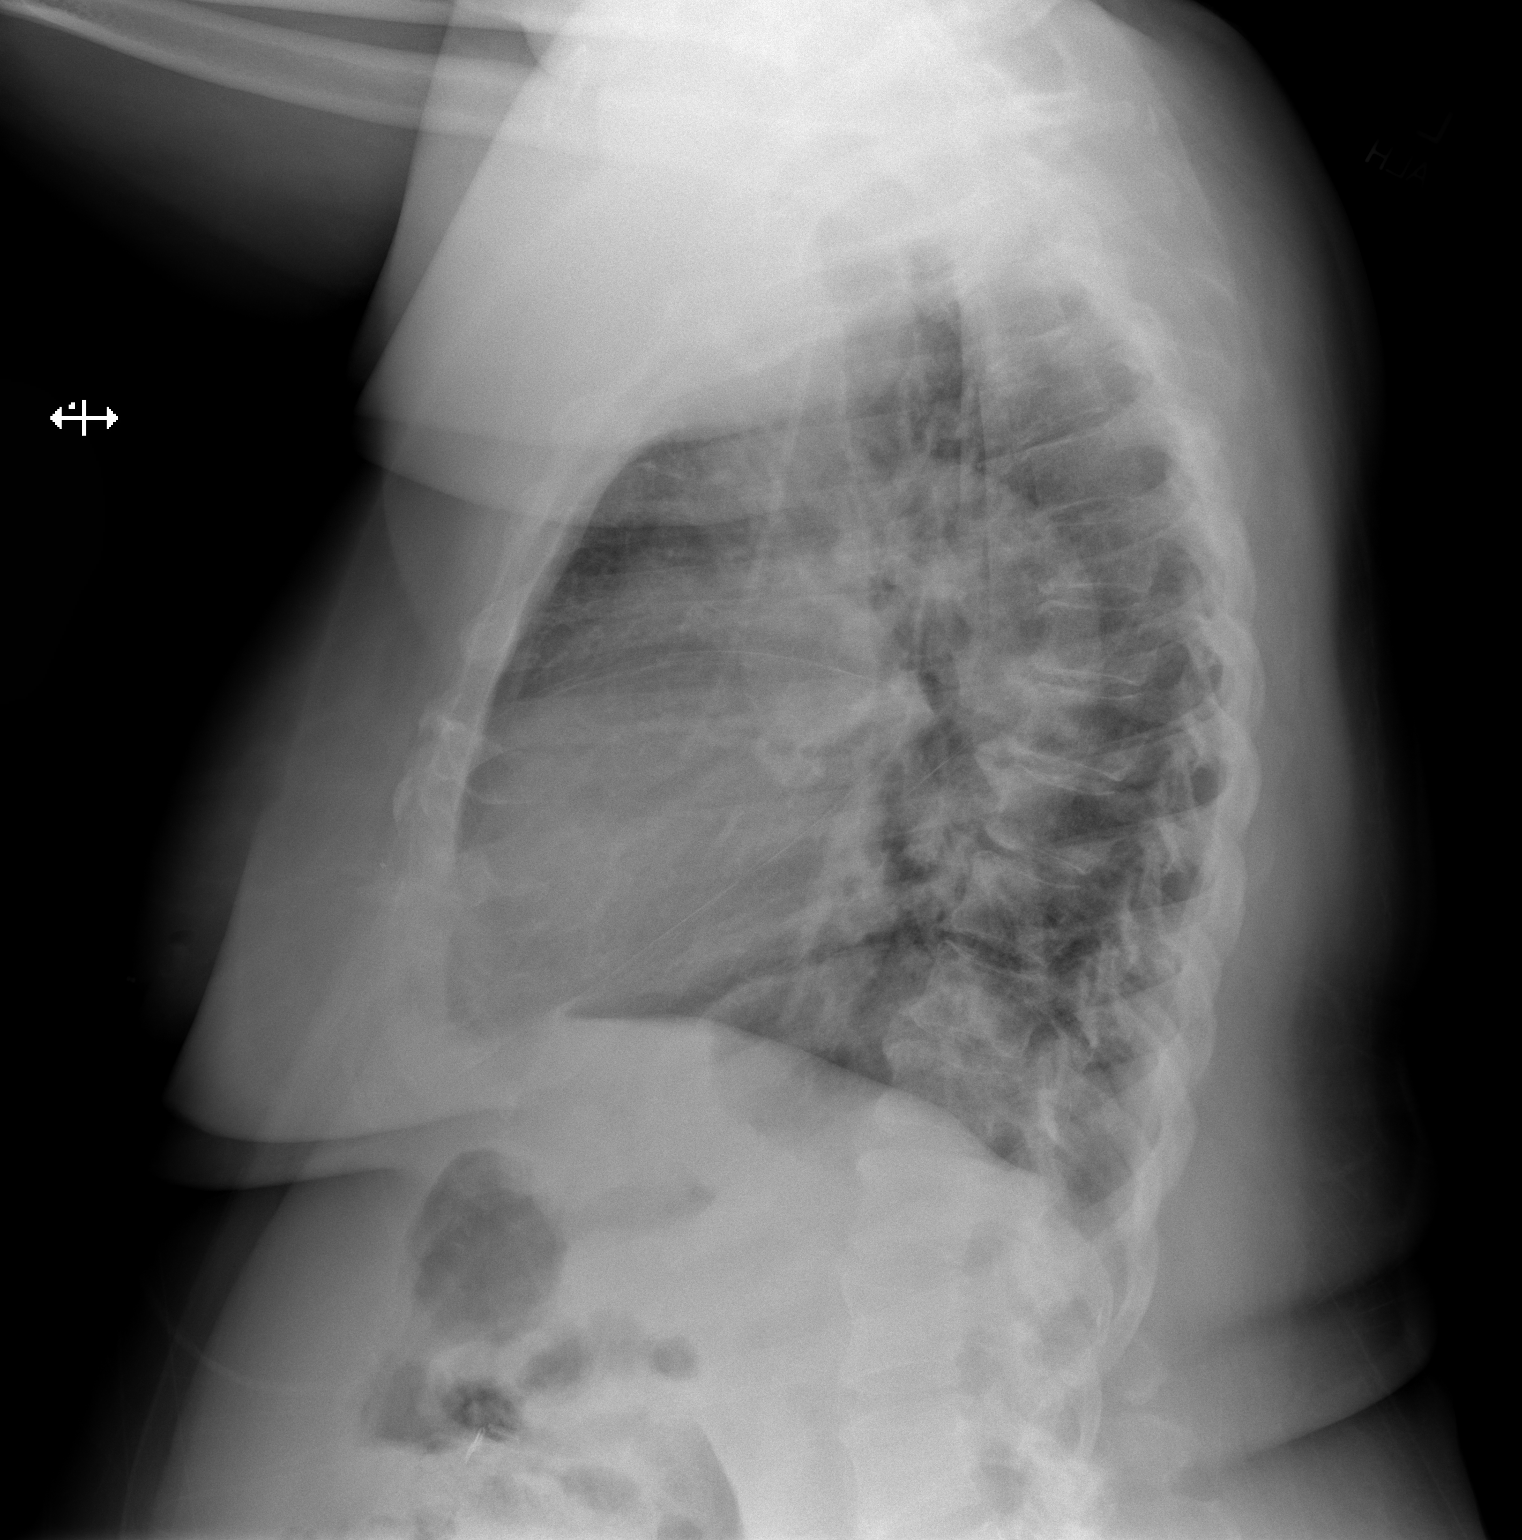

[2 of 2 positions shown; findings below may reference images not displayed]

FINDINGS: PowerPort catheter noted in stable position. Cardiomegaly with mild
pulmonary venous congestion and bilateral interstitial prominence.
Mild CHF could present this fashion. Pneumonitis cannot be excluded.
No prominent pleural effusion. No pneumothorax. No acute bony
abnormality identified. Degenerative change thoracic spine with
thoracic spine scoliosis. Surgical clips right upper quadrant.
IMPRESSION: 1. PowerPort catheter stable position.
2. Cardiomegaly with mild pulmonary venous congestion and bilateral
interstitial prominence. Mild CHF could present this fashion.
Pneumonitis cannot be excluded.

## 2020-08-24 NOTE — Telephone Encounter (Signed)
Ms Kleinschmidt's daughter Ms Dema Severin called on Friday asking if Ms Wiegand's chemo on 12/7 could be moved to 12/14.  We were able to accommodate this change.  I left a message with new appt date and time.

## 2020-08-25 ENCOUNTER — Other Ambulatory Visit: Payer: Medicare HMO

## 2020-08-25 ENCOUNTER — Observation Stay (HOSPITAL_COMMUNITY)
Admission: EM | Admit: 2020-08-25 | Discharge: 2020-08-27 | Disposition: A | Discharge status: Home / Self Care | Payer: Medicare HMO | Attending: Internal Medicine | Admitting: Internal Medicine

## 2020-08-25 ENCOUNTER — Encounter (HOSPITAL_COMMUNITY): Payer: Self-pay

## 2020-08-25 ENCOUNTER — Inpatient Hospital Stay: Payer: Medicare HMO | Attending: Hematology | Admitting: Nutrition

## 2020-08-25 ENCOUNTER — Ambulatory Visit: Payer: Medicare HMO | Admitting: Nurse Practitioner

## 2020-08-25 ENCOUNTER — Ambulatory Visit: Payer: Medicare HMO

## 2020-08-25 ENCOUNTER — Other Ambulatory Visit: Payer: Self-pay

## 2020-08-25 ENCOUNTER — Emergency Department (HOSPITAL_COMMUNITY): Payer: Medicare HMO

## 2020-08-25 ENCOUNTER — Encounter: Payer: Self-pay | Admitting: *Deleted

## 2020-08-25 DIAGNOSIS — R0602 Shortness of breath: Secondary | ICD-10-CM | POA: Diagnosis present

## 2020-08-25 DIAGNOSIS — Z79899 Other long term (current) drug therapy: Secondary | ICD-10-CM | POA: Insufficient documentation

## 2020-08-25 DIAGNOSIS — E860 Dehydration: Secondary | ICD-10-CM

## 2020-08-25 DIAGNOSIS — R0600 Dyspnea, unspecified: Secondary | ICD-10-CM | POA: Diagnosis present

## 2020-08-25 DIAGNOSIS — Z794 Long term (current) use of insulin: Secondary | ICD-10-CM | POA: Insufficient documentation

## 2020-08-25 DIAGNOSIS — Z20822 Contact with and (suspected) exposure to covid-19: Secondary | ICD-10-CM | POA: Diagnosis not present

## 2020-08-25 DIAGNOSIS — M6281 Muscle weakness (generalized): Secondary | ICD-10-CM | POA: Insufficient documentation

## 2020-08-25 DIAGNOSIS — E1159 Type 2 diabetes mellitus with other circulatory complications: Secondary | ICD-10-CM

## 2020-08-25 DIAGNOSIS — Z853 Personal history of malignant neoplasm of breast: Secondary | ICD-10-CM | POA: Insufficient documentation

## 2020-08-25 DIAGNOSIS — I110 Hypertensive heart disease with heart failure: Secondary | ICD-10-CM | POA: Diagnosis not present

## 2020-08-25 DIAGNOSIS — Z7982 Long term (current) use of aspirin: Secondary | ICD-10-CM | POA: Diagnosis not present

## 2020-08-25 DIAGNOSIS — R0609 Other forms of dyspnea: Principal | ICD-10-CM | POA: Insufficient documentation

## 2020-08-25 DIAGNOSIS — E8779 Other fluid overload: Secondary | ICD-10-CM

## 2020-08-25 DIAGNOSIS — Z170 Estrogen receptor positive status [ER+]: Secondary | ICD-10-CM | POA: Insufficient documentation

## 2020-08-25 DIAGNOSIS — I5031 Acute diastolic (congestive) heart failure: Secondary | ICD-10-CM | POA: Diagnosis not present

## 2020-08-25 DIAGNOSIS — E8770 Fluid overload, unspecified: Secondary | ICD-10-CM | POA: Insufficient documentation

## 2020-08-25 DIAGNOSIS — E119 Type 2 diabetes mellitus without complications: Secondary | ICD-10-CM | POA: Insufficient documentation

## 2020-08-25 DIAGNOSIS — Z87891 Personal history of nicotine dependence: Secondary | ICD-10-CM | POA: Insufficient documentation

## 2020-08-25 DIAGNOSIS — E669 Obesity, unspecified: Secondary | ICD-10-CM

## 2020-08-25 DIAGNOSIS — I10 Essential (primary) hypertension: Secondary | ICD-10-CM | POA: Insufficient documentation

## 2020-08-25 DIAGNOSIS — C50412 Malignant neoplasm of upper-outer quadrant of left female breast: Secondary | ICD-10-CM

## 2020-08-25 DIAGNOSIS — Z7984 Long term (current) use of oral hypoglycemic drugs: Secondary | ICD-10-CM | POA: Diagnosis not present

## 2020-08-25 DIAGNOSIS — I152 Hypertension secondary to endocrine disorders: Secondary | ICD-10-CM

## 2020-08-25 DIAGNOSIS — Z5112 Encounter for antineoplastic immunotherapy: Secondary | ICD-10-CM | POA: Insufficient documentation

## 2020-08-25 IMAGING — CR DG CHEST 2V
2 series · 2 of 2 positions shown · non-contrast
Comparison: [DATE]

CLINICAL DATA: Patient arrived stating that she is a chemo patient
and feeling short of breath with exertion since the weekend. History
of CHF, complaints of increased leg swelling. sob

EXAM:
CHEST - 2 VIEW

[w chest pa]
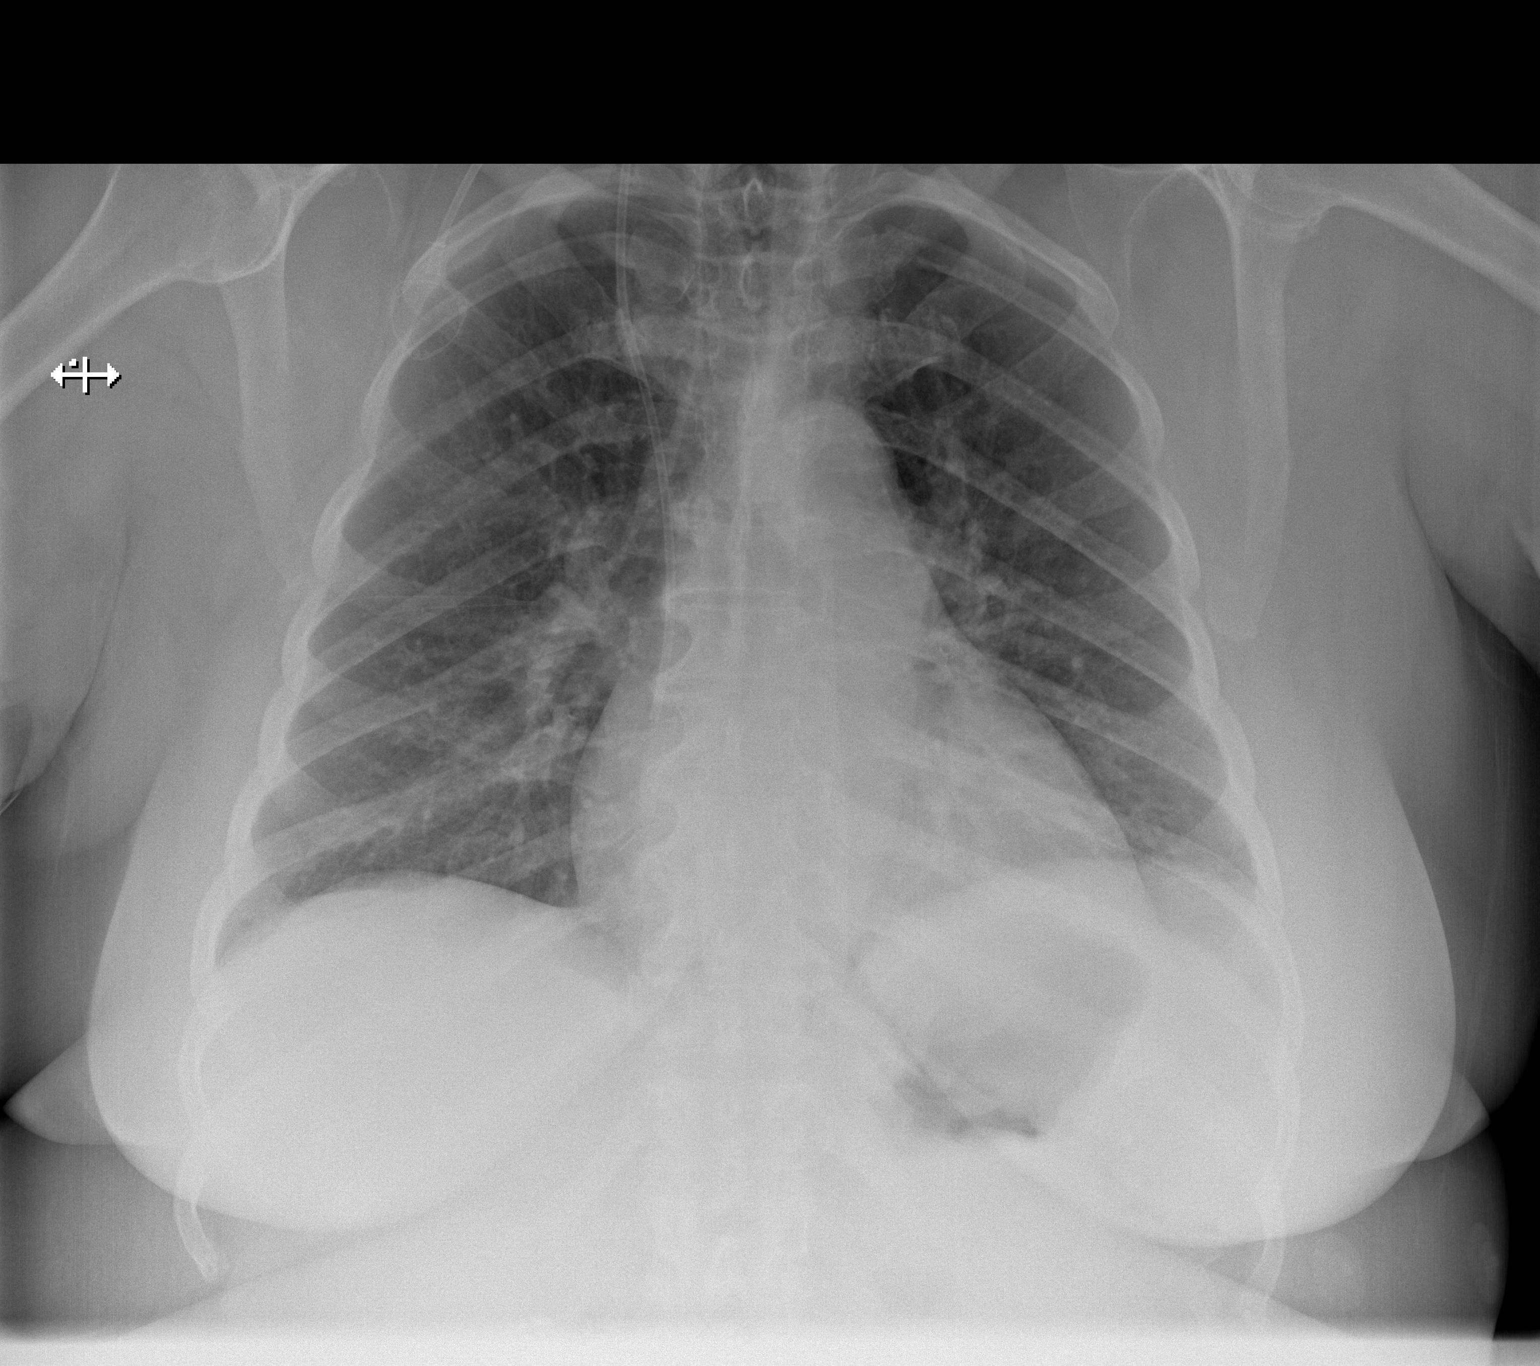

[w chest lat]
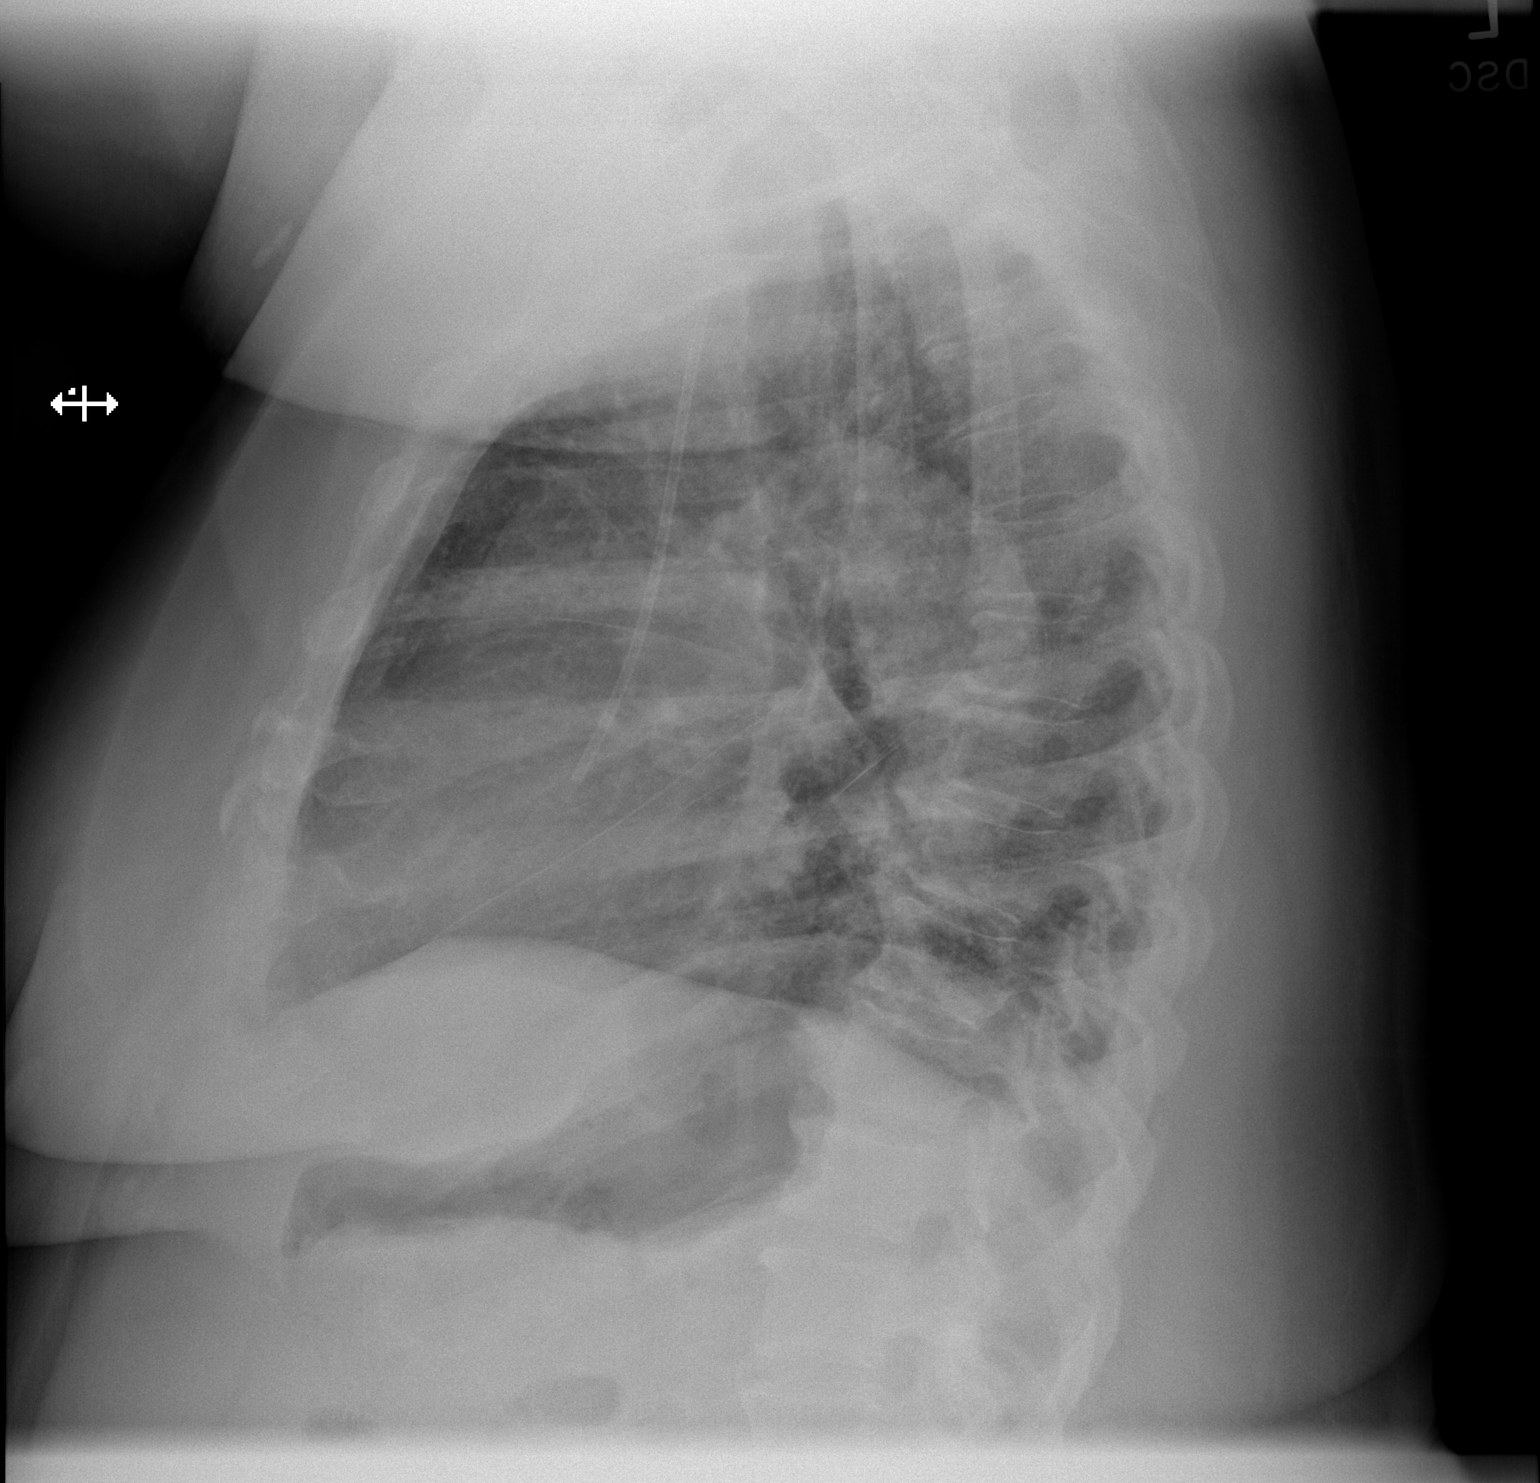

[2 of 2 positions shown; findings below may reference images not displayed]

FINDINGS: 1 Port in the anterior chest wall with tip in distal SVC. Normal
cardiac silhouette. No effusion, infiltrate pneumothorax.
Degenerative osteophytosis of the spine.
IMPRESSION: No acute cardiopulmonary process.

## 2020-08-25 NOTE — ED Triage Notes (Signed)
Patient arrived stating that she is a chemo patient and feeling short of breath with exertion since the weekend. History of CHF, complaints of increased leg swelling.

## 2020-08-25 NOTE — ED Provider Notes (Addendum)
[facility] DEPT Provider Note   CSN: [id number] Arrival date & time: 08/25/20  1841     History Chief Complaint  Patient presents with  . Shortness of Breath    Cheryl Blankenship is a 69 y.o. 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  The patient reports that she has been having worsening dyspnea on exertion for the last 5-6 days.  Dyspnea resolves with rest.  It is worse with attempting to lay flat.  She reports that she has been sleeping with 10 pillows after previously sleeping with 3-4 pillows just a few weeks ago.  She also reports worsening swelling in her legs over the last 2 to 3 weeks, abdominal swelling and distention, and orthopnea.  She has had an intermittent nonproductive cough and her PCP has called her in an albuterol inhaler, which she has tried for her symptoms without improvement.  She notes that when she was seen at her oncologist office that a 4 pound weight gain was noted despite having had a poor appetite over the last 3 weeks when she started chemotherapy.  She was scheduled for her fourth treatment tomorrow, but this was canceled and rescheduled for next week.  She reports that she does take 40 mg of Lasix at home daily.  No recent change in therapy.  She denies fever, chills, nausea, vomiting, diarrhea, headache, dizziness, lightheadedness, numbness, weakness.  No recent travel.  No recent immobilization.  No personal or familial history of VTE.  She does report that she has been having intermittent episodes of bilateral chest pain.  She is unable to characterize the pain.  No known aggravating or alleviating factors.  She states that she thought that the pain was GERD and she was started on medication by her PCP with no improvement in her symptoms.  She is  having no pain at this time.  As there was concern for worsening dyspnea.  She had a normal echo at that time. Patient was seen by cardiology on July 15, 2020.   The history is provided by the patient and medical records. No language interpreter was used.       Past Medical History:  Diagnosis Date  . Anxiety   . Breast cancer ([place])   . CHF (congestive heart failure) ([place])   . Diabetes mellitus without complication ([place])    type 2  . Hypertension     Patient Active Problem List   Diagnosis Date Noted  . Exertional dyspnea 08/26/2020  . Malignant neoplasm of upper-outer quadrant of left breast in female, estrogen receptor positive ([place]) 07/20/2020    Past Surgical History:  Procedure Laterality Date  . APPENDECTOMY    . CHOLECYSTECTOMY    . COLONOSCOPY    . EYE SURGERY     cataracts  . MULTIPLE TOOTH EXTRACTIONS    . PORTACATH PLACEMENT N/A 08/04/2020   Procedure: INSERTION PORT-A-CATH WITH ULTRASOUND GUIDANCE;  Surgeon: [name], MD;  Location: [facility];  Service: General;  Laterality: N/A;  . STOMACH SURGERY       OB History   No obstetric history on file.     Family History  Problem Relation Age of Onset  . Heart attack Sister   . Diabetes Sister   . Heart attack Mother   . Ovarian cancer Mother 79  .  Heart attack Brother   . Cancer Niece        bone cancer   . Cancer Cousin        unknown type cancer   . Cancer Cousin        metastatic cancer to bone     Social History   Tobacco Use  . Smoking status: Former Smoker    Packs/day: 3.00    Years: 30.00    Pack years: 90.00  . Smokeless tobacco: Never Used  Vaping Use  . Vaping Use: Never used  Substance Use Topics  . 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 Drug use: Never    Home Medications Prior to Admission medications   Medication Sig Start Date End Date Taking? Authorizing Provider  Accu-Chek FastClix Lancets MISC  08/14/20    [provider]  amLODipine (NORVASC) 10 MG tablet Take 10 mg by mouth daily. 10/03/19 07/30/20  [provider]  aspirin 81 MG chewable tablet Chew 81 mg by mouth daily. Patient not taking: Reported on 08/18/2020    [provider]  Cholecalciferol 25 MCG (1000 UT) capsule Take 1,000 Units by mouth daily. 06/25/20   [provider]  diphenoxylate-atropine (LOMOTIL) 2.5-0.025 MG tablet Take 1-2 tablets by mouth 4 (four) times daily as needed for diarrhea or loose stools. 08/05/20   [name], MD  DULoxetine (CYMBALTA) 30 MG capsule Take 30 mg by mouth daily. 04/21/20   [provider]  empagliflozin (JARDIANCE) 25 MG TABS tablet Take 25 mg by mouth daily. 04/02/20   [provider]  fluorometholone (FML) 0.1 % ophthalmic suspension Place 1 drop into both eyes daily. 06/09/20   [provider]  folic acid (FOLVITE) 1 MG tablet Take 1 tablet by mouth daily. 07/14/20   [provider]  furosemide (LASIX) 40 MG tablet Take 40 mg by mouth daily. 06/24/20   [provider]  gabapentin (NEURONTIN) 400 MG capsule Take 400 mg by mouth 3 (three) times daily.  04/21/20   [provider]  glipiZIDE (GLUCOTROL XL) 5 MG 24 hr tablet Take 5 mg by mouth daily with breakfast.  06/24/20   [provider]  HYDROcodone-acetaminophen (NORCO/VICODIN) 5-325 MG tablet Take 1 tablet by mouth every 6 (six) hours as needed for moderate pain. 08/04/20   [name], MD  insulin glargine (LANTUS SOLOSTAR) 100 UNIT/ML Solostar Pen Inject 70 Units into the skin 2 (two) times daily.  04/20/20   [provider]  insulin regular (NOVOLIN R) 100 units/mL injection Inject 0-22 Units into the skin 3 (three) times daily before meals. Dose per sliding scale 01/29/20   [provider]  losartan (COZAAR) 100 MG tablet Take 100 mg by mouth daily. 06/24/20   [provider]  metFORMIN (GLUCOPHAGE) 1000 MG tablet Take 1,000 mg by  mouth daily with supper.  Patient not taking: Reported on 08/18/2020 04/21/20   [provider]  metFORMIN (GLUCOPHAGE) 500 MG tablet Take 500 mg by mouth daily with breakfast. Patient not taking: Reported on 08/18/2020 06/25/20   [provider]  metoprolol tartrate (LOPRESSOR) 50 MG tablet Take 50 mg by mouth in the morning and at bedtime. 06/29/20   [provider]  NOVOLOG FLEXPEN 100 UNIT/ML FlexPen  08/10/20   [provider]  Omega-3 Fatty Acids (FISH OIL) 1000 MG CAPS Take 1,000 mg by mouth daily.    [provider]  ondansetron (ZOFRAN-ODT) 4  MG disintegrating tablet Take 4 mg by mouth 2 (two) times daily as needed for nausea or vomiting.  04/22/20   [provider]  oxyCODONE (OXY IR/ROXICODONE) 5 MG immediate release tablet Take 1-2 tablets (5-10 mg total) by mouth every 8 (eight) hours as needed for severe pain. 08/11/20   [name], MD  potassium chloride (KLOR-CON) 10 MEQ tablet Take 20 mEq by mouth daily. DAILY WITH LASIX    [provider]  pregabalin (LYRICA) 75 MG capsule Take 75 mg by mouth at bedtime.  12/04/19   [provider]  RESTASIS 0.05 % ophthalmic emulsion Place 1 drop into both eyes 2 (two) times daily. 07/13/20   [provider]  rosuvastatin (CRESTOR) 5 MG tablet Take 5 mg by mouth at bedtime. 06/25/20   [provider]  tiZANidine (ZANAFLEX) 4 MG tablet Take 4 mg by mouth 2 (two) times daily as needed for muscle spasms.  06/23/20   [provider]  vitamin E (VITAMIN E) 180 MG (400 UNITS) capsule Take 400 Units by mouth daily.    [provider]  prochlorperazine (COMPAZINE) 10 MG tablet Take 1 tablet (10 mg total) by mouth every 6 (six) hours as needed (Nausea or vomiting). 07/22/20 08/18/20  [name], MD    Allergies    Hydrochlorothiazide, Lisinopril, and Coconut oil  Review of Systems   Review of Systems  Constitutional: Negative for activity change, chills  and fever.  HENT: Negative for congestion and sore throat.   Eyes: Negative for visual disturbance.  Respiratory: Positive for cough and shortness of breath. Negative for wheezing and stridor.   Cardiovascular: Positive for chest pain and leg swelling. Negative for palpitations.  Gastrointestinal: Positive for abdominal distention. Negative for abdominal pain, diarrhea, nausea and vomiting.  Genitourinary: Negative for dysuria.  Musculoskeletal: Negative for back pain and myalgias.  Skin: Negative for rash.  Allergic/Immunologic: Negative for immunocompromised state.  Neurological: Negative for dizziness, seizures, syncope, weakness, numbness and headaches.  Psychiatric/Behavioral: Negative for confusion.    Physical Exam Updated Vital Signs BP (!) 187/95   Pulse 63   Temp 98.8 F (37.1 C) (Oral)   Resp 11   SpO2 95%   Physical Exam Vitals and nursing note reviewed.  Constitutional:      General: She is not in acute distress.    Appearance: She is not ill-appearing, toxic-appearing or diaphoretic.  HENT:     Head: Normocephalic.     Nose: Nose normal.  Eyes:     Extraocular Movements: Extraocular movements intact.     Conjunctiva/sclera: Conjunctivae normal.     Pupils: Pupils are equal, round, and reactive to light.  Cardiovascular:     Rate and Rhythm: Normal rate and regular rhythm.     Heart sounds: No murmur heard.  No friction rub. No gallop.   Pulmonary:     Effort: Pulmonary effort is normal. No respiratory distress.     Comments: Crackles in the bilateral bases.  Able to speak in complete, fluent sentences without increased work of breathing. Abdominal:     General: There is no distension.     Palpations: Abdomen is soft.     Comments: Abdomen is distended and moderately firm, but not hard.  Normoactive bowel sounds.  Positive fluid wave.  Abdomen is nontender.  Musculoskeletal:        General: No tenderness.     Cervical back: Normal range of motion and neck  supple.     Right lower leg:  Edema present.     Left lower leg: Edema present.     Comments: 2+ pitting edema noted to the bilateral lower extremities.  Skin:    General: Skin is warm.     Findings: No rash.  Neurological:     Mental Status: She is alert.  Psychiatric:        Behavior: Behavior normal.    ED Results / Procedures / Treatments   Labs (all labs ordered are listed, but only abnormal results are displayed) Labs Reviewed  BASIC METABOLIC PANEL - Abnormal; Notable for the following components:      Result Value   Glucose, Bld 143 (*)    All other components within normal limits  CBC - Abnormal; Notable for the following components:   Hemoglobin 11.2 (*)    HCT 35.9 (*)    All other components within normal limits  RESP PANEL BY RT-PCR (FLU A&B, COVID) [id number]  BRAIN NATRIURETIC PEPTIDE  TROPONIN I (HIGH SENSITIVITY)  TROPONIN I (HIGH SENSITIVITY)    EKG EKG Interpretation  Date/Time:  Tuesday August 25 2020 19:33:19 EST Ventricular Rate:  82 PR Interval:    QRS Duration: 115 QT Interval:  382 QTC Calculation: 447 R Axis:   -44 Text Interpretation: Sinus rhythm LVH with IVCD, LAD and secondary repol abnrm Anterior ST elevation, probably due to LVH 12 Lead; Mason-Likar No old tracing to compare Confirmed by Deno Etienne [phone]) on 08/26/2020 1:12:04 AM   Radiology DG Chest 2 View  Result Date: 08/25/2020 CLINICAL DATA:  Patient arrived stating that she is a chemo patient and feeling short of breath with exertion since the weekend. History of CHF, complaints of increased leg swelling. sob EXAM: CHEST - 2 VIEW COMPARISON:  08/25/2019 FINDINGS: 1 Port in the anterior chest wall with tip in distal SVC. Normal cardiac silhouette. No effusion, infiltrate pneumothorax. Degenerative osteophytosis of the spine. IMPRESSION: No acute cardiopulmonary process. Electronically Signed   By: Suzy Bouchard M.D.   On: 08/25/2020 20:03   DG Chest 2 View  Result Date:  08/25/2020 CLINICAL DATA:  Wheezing.  Shortness of breath. EXAM: CHEST - 2 VIEW COMPARISON:  CT 08/07/2020.  Chest x-ray 08/04/2020. FINDINGS: PowerPort catheter noted in stable position. Cardiomegaly with mild pulmonary venous congestion and bilateral interstitial prominence. Mild CHF could present this fashion. Pneumonitis cannot be excluded. No prominent pleural effusion. No pneumothorax. No acute bony abnormality identified. Degenerative change thoracic spine with thoracic spine scoliosis. Surgical clips right upper quadrant. IMPRESSION: 1. PowerPort catheter stable position. 2. Cardiomegaly with mild pulmonary venous congestion and bilateral interstitial prominence. Mild CHF could present this fashion. Pneumonitis cannot be excluded. Electronically Signed   By: Marcello Moores  Register   On: 08/25/2020 14:50    Procedures Procedures (including critical care time)  Medications Ordered in ED Medications  furosemide (LASIX) injection 40 mg (40 mg Intravenous Given 08/26/20 0128)  metoprolol tartrate (LOPRESSOR) tablet 50 mg (50 mg Oral Given 08/26/20 0329)  amLODipine (NORVASC) tablet 10 mg (10 mg Oral Given 08/26/20 0329)    ED Course  I have reviewed the triage vital signs and the nursing notes.  Pertinent labs & imaging results that were available during my care of the patient were reviewed by me and considered in my medical decision making (see chart for details).  Clinical Course as of Aug 26 632  Wed Aug 26, 2020  0414 Went to reevaluate the patient after she was ambulated on pulse ox.  Tech noted that her oxygen saturation did  not go below 91%, but she did have to stop partially down the hallway and catch her breath.  When I reentered the room, pulse oximeter was at 88% with good waveform on the monitor and she slowly increased to 94% at rest.    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  She is currently undergoing chemotherapy.  Her oncologist noted that she had a 4 pound weight gain at her last visit on Friday.  Patient reports that her baseline weight is around 220 pounds.  The patient was discussed and independently evaluated by Dr. Tyrone Nine, attending physician.  Differential diagnosis includes acute congestive heart failure, PE, pneumonia, pericarditis, myocarditis, flash pulmonary edema associated with hypertensive emergency, or pneumothorax.  Patient was hypertensive after reporting that she had not taken her home losartan, Norvasc, and metoprolol.  She was satting at 95% on room air at rest.  Afebrile and without tachypnea.  Patient was ambulated through the department and became very tachypneic.  Tach noted an oxygen saturation of 91%, but after evaluating the patient shortly after exertion, she was found to have oxygen saturation of 88% with good waveform on the monitor.  This slowly improved over several minutes to the low 90s.  On exam, she appears volume overloaded.  Labs and imaging has been independently reviewed and interpreted by me.  Chest x-ray has been compared to x-ray from yesterday and demonstrates cardiomegaly with mild pulmonary venous congestion bilateral interstitial prominence suggestive of CHF versus pneumonitis.  COVID-19 test is negative.  Delta troponin is not elevated.  BNP is normal, but this can be falsely low as the patient is obese.  No metabolic derangements.  She is anemic, but this is stable from previous.  She was given 40 mg of Lasix in the emergency department.  Less suspicious for PE at this time.  She was given her home metoprolol and Norvasc.  Given new hypoxia, she will be admitted to the  hospital for further work-up and evaluation.  Dr. Myna Hidalgo has accepted the patient for admission. The patient appears reasonably stabilized for admission considering the current resources, flow, and capabilities available in the ED at this time, and I doubt any other [facility] requiring further screening and/or treatment in the ED prior to admission.  Final Clinical Impression(s) / ED Diagnoses Final diagnoses:  Cardiac volume overload    Rx / DC Orders ED Discharge Orders    None       Alexius Ellington A, PA-C 08/26/20 0633    Joline Maxcy A, PA-C 08/26/20 [place], [place], DO 08/26/20 3212

## 2020-08-25 NOTE — ED Notes (Signed)
Patient requesting blood be taken when port is accessed.

## 2020-08-25 NOTE — Progress Notes (Signed)
I called patient's mobile member as listed in my chart and spoke with patient's daughter.  Daughter reports she is not with her mother at this moment however she would give my telephone number to her mother when she sees her.  I also called patient at phone number provided by daughter.  There was no answer and I did not leave a message.  They have my contact information.  We will try to reschedule as needed.

## 2020-08-26 ENCOUNTER — Encounter (HOSPITAL_COMMUNITY): Payer: Self-pay | Admitting: Family Medicine

## 2020-08-26 DIAGNOSIS — C50919 Malignant neoplasm of unspecified site of unspecified female breast: Secondary | ICD-10-CM

## 2020-08-26 DIAGNOSIS — R0600 Dyspnea, unspecified: Secondary | ICD-10-CM | POA: Diagnosis not present

## 2020-08-26 DIAGNOSIS — R0609 Other forms of dyspnea: Secondary | ICD-10-CM | POA: Diagnosis not present

## 2020-08-26 DIAGNOSIS — I152 Hypertension secondary to endocrine disorders: Secondary | ICD-10-CM

## 2020-08-26 DIAGNOSIS — I110 Hypertensive heart disease with heart failure: Secondary | ICD-10-CM

## 2020-08-26 DIAGNOSIS — I10 Essential (primary) hypertension: Secondary | ICD-10-CM | POA: Diagnosis not present

## 2020-08-26 DIAGNOSIS — I5030 Unspecified diastolic (congestive) heart failure: Secondary | ICD-10-CM

## 2020-08-26 DIAGNOSIS — I5031 Acute diastolic (congestive) heart failure: Secondary | ICD-10-CM | POA: Diagnosis not present

## 2020-08-26 DIAGNOSIS — Z170 Estrogen receptor positive status [ER+]: Secondary | ICD-10-CM

## 2020-08-26 DIAGNOSIS — E1142 Type 2 diabetes mellitus with diabetic polyneuropathy: Secondary | ICD-10-CM

## 2020-08-26 DIAGNOSIS — E1140 Type 2 diabetes mellitus with diabetic neuropathy, unspecified: Secondary | ICD-10-CM

## 2020-08-26 DIAGNOSIS — E1159 Type 2 diabetes mellitus with other circulatory complications: Secondary | ICD-10-CM

## 2020-08-26 DIAGNOSIS — E669 Obesity, unspecified: Secondary | ICD-10-CM

## 2020-08-26 DIAGNOSIS — Z794 Long term (current) use of insulin: Secondary | ICD-10-CM

## 2020-08-26 LAB — CBC
HCT: 35.5 % — ABNORMAL LOW (ref 36.0–46.0)
HCT: 35.9 % — ABNORMAL LOW (ref 36.0–46.0)
Hemoglobin: 11.2 g/dL — ABNORMAL LOW (ref 12.0–15.0)
Hemoglobin: 11.4 g/dL — ABNORMAL LOW (ref 12.0–15.0)
MCH: 28 pg (ref 26.0–34.0)
MCH: 28.4 pg (ref 26.0–34.0)
MCHC: 31.2 g/dL (ref 30.0–36.0)
MCHC: 32.1 g/dL (ref 30.0–36.0)
MCV: 88.3 fL (ref 80.0–100.0)
MCV: 89.8 fL (ref 80.0–100.0)
Platelets: 329 10*3/uL (ref 150–400)
Platelets: 335 10*3/uL (ref 150–400)
RBC: 4 MIL/uL (ref 3.87–5.11)
RBC: 4.02 MIL/uL (ref 3.87–5.11)
RDW: 13.2 % (ref 11.5–15.5)
RDW: 13.3 % (ref 11.5–15.5)
WBC: 9.2 10*3/uL (ref 4.0–10.5)
WBC: 9.4 10*3/uL (ref 4.0–10.5)
nRBC: 0.2 % (ref 0.0–0.2)
nRBC: 0.3 % — ABNORMAL HIGH (ref 0.0–0.2)

## 2020-08-26 LAB — RESP PANEL BY RT-PCR (FLU A&B, COVID) ARPGX2
Influenza A by PCR: NEGATIVE
Influenza B by PCR: NEGATIVE
SARS Coronavirus 2 by RT PCR: NEGATIVE

## 2020-08-26 LAB — COMPREHENSIVE METABOLIC PANEL
ALT: 16 U/L (ref 0–44)
AST: 14 U/L — ABNORMAL LOW (ref 15–41)
Albumin: 3.9 g/dL (ref 3.5–5.0)
Alkaline Phosphatase: 63 U/L (ref 38–126)
Anion gap: 12 (ref 5–15)
BUN: 14 mg/dL (ref 8–23)
CO2: 26 mmol/L (ref 22–32)
Calcium: 10 mg/dL (ref 8.9–10.3)
Chloride: 101 mmol/L (ref 98–111)
Creatinine, Ser: 0.73 mg/dL (ref 0.44–1.00)
GFR, Estimated: >60 mL/min (ref >60)
Glucose, Bld: 154 mg/dL — ABNORMAL HIGH (ref 70–99)
Potassium: 3.3 mmol/L — ABNORMAL LOW (ref 3.5–5.1)
Sodium: 139 mmol/L (ref 135–145)
Total Bilirubin: 0.6 mg/dL (ref 0.3–1.2)
Total Protein: 7.2 g/dL (ref 6.5–8.1)

## 2020-08-26 LAB — GLUCOSE, CAPILLARY
Glucose-Capillary: 147 mg/dL — ABNORMAL HIGH (ref 70–99)
Glucose-Capillary: 172 mg/dL — ABNORMAL HIGH (ref 70–99)
Glucose-Capillary: 197 mg/dL — ABNORMAL HIGH (ref 70–99)
Glucose-Capillary: 165 mg/dL — ABNORMAL HIGH (ref 70–99)

## 2020-08-26 LAB — BASIC METABOLIC PANEL
Anion gap: 8 (ref 5–15)
BUN: 17 mg/dL (ref 8–23)
CO2: 29 mmol/L (ref 22–32)
Calcium: 10.3 mg/dL (ref 8.9–10.3)
Chloride: 104 mmol/L (ref 98–111)
Creatinine, Ser: 0.94 mg/dL (ref 0.44–1.00)
GFR, Estimated: >60 mL/min (ref >60)
Glucose, Bld: 143 mg/dL — ABNORMAL HIGH (ref 70–99)
Potassium: 3.7 mmol/L (ref 3.5–5.1)
Sodium: 141 mmol/L (ref 135–145)

## 2020-08-26 LAB — BRAIN NATRIURETIC PEPTIDE: B Natriuretic Peptide: 45.4 pg/mL (ref 0.0–100.0)

## 2020-08-26 LAB — HEMOGLOBIN A1C
Hgb A1c MFr Bld: 9.8 % — ABNORMAL HIGH (ref 4.8–5.6)
Mean Plasma Glucose: 234.56 mg/dL

## 2020-08-26 LAB — HIV ANTIBODY (ROUTINE TESTING W REFLEX): HIV Screen 4th Generation wRfx: NONREACTIVE

## 2020-08-26 LAB — TROPONIN I (HIGH SENSITIVITY)
Troponin I (High Sensitivity): 7 ng/L (ref <18)
Troponin I (High Sensitivity): 6 ng/L (ref <18)

## 2020-08-26 MED ORDER — INSULIN GLARGINE 100 UNIT/ML ~~LOC~~ SOLN: 30.0000 [IU] | Freq: Every day | SUBCUTANEOUS | Status: DC

## 2020-08-26 MED ORDER — FUROSEMIDE 10 MG/ML IJ SOLN
40.0000 mg | Freq: Two times a day (BID) | INTRAMUSCULAR | Status: DC
  Administered 2020-08-26 – 2020-08-27 (×3): 40 mg via INTRAVENOUS
  Filled 2020-08-26 (×3): qty 4

## 2020-08-26 MED ORDER — ALBUTEROL SULFATE HFA 108 (90 BASE) MCG/ACT IN AERS
1.0000 | INHALATION_SPRAY | RESPIRATORY_TRACT | Status: DC | PRN
  Filled 2020-08-26: qty 6.7

## 2020-08-26 MED ORDER — ONDANSETRON HCL 4 MG/2ML IJ SOLN
4.0000 mg | Freq: Three times a day (TID) | INTRAMUSCULAR | Status: DC | PRN
  Administered 2020-08-26: 4 mg via INTRAVENOUS
  Filled 2020-08-26: qty 2

## 2020-08-26 MED ORDER — INSULIN ASPART 100 UNIT/ML ~~LOC~~ SOLN
6.0000 [IU] | Freq: Three times a day (TID) | SUBCUTANEOUS | Status: DC
  Administered 2020-08-26 – 2020-08-27 (×4): 6 [IU] via SUBCUTANEOUS

## 2020-08-26 MED ORDER — INSULIN GLARGINE 100 UNIT/ML ~~LOC~~ SOLN
30.0000 [IU] | Freq: Every day | SUBCUTANEOUS | Status: DC
  Administered 2020-08-26 – 2020-08-27 (×2): 30 [IU] via SUBCUTANEOUS
  Filled 2020-08-26 (×2): qty 0.3

## 2020-08-26 MED ORDER — GABAPENTIN 400 MG PO CAPS
400.0000 mg | ORAL_CAPSULE | Freq: Three times a day (TID) | ORAL | Status: DC
  Administered 2020-08-26 – 2020-08-27 (×4): 400 mg via ORAL
  Filled 2020-08-26 (×4): qty 1

## 2020-08-26 MED ORDER — PREGABALIN 75 MG PO CAPS
75.0000 mg | ORAL_CAPSULE | Freq: Every day | ORAL | Status: DC
  Administered 2020-08-26: 75 mg via ORAL
  Filled 2020-08-26: qty 1

## 2020-08-26 MED ORDER — ACETAMINOPHEN 650 MG RE SUPP: 650.0000 mg | Freq: Four times a day (QID) | RECTAL | Status: DC | PRN

## 2020-08-26 MED ORDER — ENOXAPARIN SODIUM 40 MG/0.4ML ~~LOC~~ SOLN
40.0000 mg | SUBCUTANEOUS | Status: DC
  Administered 2020-08-26 – 2020-08-27 (×2): 40 mg via SUBCUTANEOUS
  Filled 2020-08-26 (×3): qty 0.4

## 2020-08-26 MED ORDER — HYDROCODONE-ACETAMINOPHEN 5-325 MG PO TABS
1.0000 | ORAL_TABLET | ORAL | Status: DC | PRN
  Administered 2020-08-26 – 2020-08-27 (×2): 1 via ORAL
  Filled 2020-08-26 (×2): qty 1

## 2020-08-26 MED ORDER — FOLIC ACID 1 MG PO TABS
1.0000 mg | ORAL_TABLET | Freq: Every day | ORAL | Status: DC
  Administered 2020-08-26 – 2020-08-27 (×2): 1 mg via ORAL
  Filled 2020-08-26 (×2): qty 1

## 2020-08-26 MED ORDER — DULOXETINE HCL 30 MG PO CPEP
30.0000 mg | ORAL_CAPSULE | Freq: Every day | ORAL | Status: DC
  Administered 2020-08-26 – 2020-08-27 (×2): 30 mg via ORAL
  Filled 2020-08-26 (×2): qty 1

## 2020-08-26 MED ORDER — METOPROLOL TARTRATE 25 MG PO TABS
50.0000 mg | ORAL_TABLET | Freq: Once | ORAL | Status: AC
  Administered 2020-08-26: 50 mg via ORAL
  Filled 2020-08-26: qty 2

## 2020-08-26 MED ORDER — DIPHENOXYLATE-ATROPINE 2.5-0.025 MG PO TABS: 1.0000 | ORAL_TABLET | Freq: Four times a day (QID) | ORAL | Status: DC | PRN

## 2020-08-26 MED ORDER — SODIUM CHLORIDE 0.9% FLUSH
3.0000 mL | Freq: Two times a day (BID) | INTRAVENOUS | Status: DC
  Administered 2020-08-26 – 2020-08-27 (×3): 3 mL via INTRAVENOUS

## 2020-08-26 MED ORDER — ASPIRIN 81 MG PO CHEW
81.0000 mg | CHEWABLE_TABLET | Freq: Every day | ORAL | Status: DC
  Administered 2020-08-26 – 2020-08-27 (×2): 81 mg via ORAL
  Filled 2020-08-26 (×2): qty 1

## 2020-08-26 MED ORDER — ACETAMINOPHEN 325 MG PO TABS
650.0000 mg | ORAL_TABLET | Freq: Four times a day (QID) | ORAL | Status: DC | PRN
  Administered 2020-08-27: 650 mg via ORAL
  Filled 2020-08-26: qty 2

## 2020-08-26 MED ORDER — MELATONIN 3 MG PO TABS
3.0000 mg | ORAL_TABLET | Freq: Every day | ORAL | Status: DC
  Administered 2020-08-26: 3 mg via ORAL
  Filled 2020-08-26: qty 1

## 2020-08-26 MED ORDER — INSULIN ASPART 100 UNIT/ML ~~LOC~~ SOLN: 0.0000 [IU] | Freq: Every day | SUBCUTANEOUS | Status: DC

## 2020-08-26 MED ORDER — AMLODIPINE BESYLATE 5 MG PO TABS
10.0000 mg | ORAL_TABLET | Freq: Once | ORAL | Status: AC
  Administered 2020-08-26: 10 mg via ORAL
  Filled 2020-08-26: qty 2

## 2020-08-26 MED ORDER — FUROSEMIDE 10 MG/ML IJ SOLN
40.0000 mg | Freq: Once | INTRAMUSCULAR | Status: AC
  Administered 2020-08-26: 40 mg via INTRAVENOUS
  Filled 2020-08-26: qty 4

## 2020-08-26 MED ORDER — SODIUM CHLORIDE 0.9 % IV SOLN: INTRAVENOUS | Status: AC

## 2020-08-26 MED ORDER — INSULIN ASPART 100 UNIT/ML ~~LOC~~ SOLN
0.0000 [IU] | Freq: Three times a day (TID) | SUBCUTANEOUS | Status: DC
  Administered 2020-08-26 (×2): 3 [IU] via SUBCUTANEOUS
  Administered 2020-08-27: 5 [IU] via SUBCUTANEOUS
  Administered 2020-08-27: 3 [IU] via SUBCUTANEOUS

## 2020-08-26 MED ORDER — POTASSIUM CHLORIDE CRYS ER 20 MEQ PO TBCR
20.0000 meq | EXTENDED_RELEASE_TABLET | Freq: Every day | ORAL | Status: DC
  Administered 2020-08-26 – 2020-08-27 (×2): 20 meq via ORAL
  Filled 2020-08-26 (×2): qty 1

## 2020-08-26 MED ORDER — GLUCERNA SHAKE PO LIQD
237.0000 mL | Freq: Three times a day (TID) | ORAL | Status: DC
  Administered 2020-08-26 – 2020-08-27 (×2): 237 mL via ORAL
  Filled 2020-08-26 (×4): qty 237

## 2020-08-26 MED ORDER — AMLODIPINE BESYLATE 10 MG PO TABS
10.0000 mg | ORAL_TABLET | Freq: Every day | ORAL | Status: DC
  Administered 2020-08-26 – 2020-08-27 (×2): 10 mg via ORAL
  Filled 2020-08-26 (×2): qty 1

## 2020-08-26 MED ORDER — ROSUVASTATIN CALCIUM 5 MG PO TABS
5.0000 mg | ORAL_TABLET | Freq: Every day | ORAL | Status: DC
  Administered 2020-08-26: 5 mg via ORAL
  Filled 2020-08-26: qty 1

## 2020-08-26 MED ORDER — FLUOROMETHOLONE 0.1 % OP SUSP
1.0000 [drp] | Freq: Every day | OPHTHALMIC | Status: DC
  Administered 2020-08-26 – 2020-08-27 (×2): 1 [drp] via OPHTHALMIC
  Filled 2020-08-26 (×2): qty 5

## 2020-08-26 MED ORDER — METOPROLOL TARTRATE 50 MG PO TABS
50.0000 mg | ORAL_TABLET | Freq: Two times a day (BID) | ORAL | Status: DC
  Administered 2020-08-26 – 2020-08-27 (×3): 50 mg via ORAL
  Filled 2020-08-26 (×3): qty 1

## 2020-08-26 NOTE — ED Notes (Signed)
Patient is resting comfortably. 

## 2020-08-26 NOTE — ED Notes (Signed)
Report given kim rn

## 2020-08-26 NOTE — Progress Notes (Signed)
Pt alert and aware sitting up in bed. Pt states she is doing better today. We got to talking and found out that she and I are from the same home town. She talked about her church here and there in [place], and how God has blessed her. The chaplain offered caring and supportive presence, prayers and blessings. Further visits will be offered.

## 2020-08-26 NOTE — Evaluation (Signed)
Physical Therapy Evaluation Patient Details Name: Cheryl Blankenship MRN: [id number] DOB: 10/21/1950 Today's Date: 08/26/2020   History of Present Illness  Patient is a 69 y.o. female wiht PMH significant for breast cancer, DM with poorly controlled neuropathy, HTN, CHF (last EF 65 to 70% on 10/01/2019) who presented to the ED with chief complaint of dyspnea on exertion x5 to 6 days. Patient reported having intermittent episodes of bilateral chest pain which was initially thought to be due to GERD but did not improve with a medication prescribed by her PCP.     Clinical Impression  Cheryl Blankenship is 69 y.o. female admitted with above HPI and diagnosis. Patient is currently limited by functional impairments below (see PT problem list). Patient lives with family and is modified independent with SPC for gait at baseline. Patient has been greatly limited by SOB with activity and required frequent standing rest breaks to ambulate ~ 80' today. SPO2 remained 90-94% on RA with activity. She demonstrated mild gait deviation and impaired balance and is reliant on UE support for ambulation, educated pt on benefits of mobilizing with RW at this time. Patient will benefit from continued skilled PT interventions to address impairments and progress independence with mobility, recommending HHPT follow up. Acute PT will follow and progress as able.     Follow Up Recommendations Home health PT    Equipment Recommendations  None recommended by PT    Recommendations for Other Services       Precautions / Restrictions Precautions Precautions: Fall Precaution Comments: pt reports at least 4 falls in last 6 months Restrictions Weight Bearing Restrictions: No      Mobility  Bed Mobility Overal bed mobility: Needs Assistance Bed Mobility: Supine to Sit     Supine to sit: Supervision;HOB elevated     General bed mobility comments: HOB greatly elevated. no assist needed, supervision for safety.      Transfers Overall transfer level: Needs assistance Equipment used: None (IV pole) Transfers: Sit to/from [facility] Sit to Stand: Min assist Stand pivot transfers: Min assist       General transfer comment: min assist to steady with power up from EOB. pt using IV pole to steady once standing. Min assist to manage IV pole with stand step transfer to move bed>recliner.   Ambulation/Gait Ambulation/Gait assistance: Min guard Gait Distance (Feet): 80 Feet Assistive device: IV Pole Gait Pattern/deviations: Step-through pattern;Decreased step length - right;Decreased step length - left;Decreased stride length;Wide base of support Gait velocity: decr   General Gait Details: cues for safe use of IV pole for gait as pt typically uses SPC. pt relying on bil UE support on IV pole. Educated on benefits of RW for improved balance with ambulation. close min guard or safety. Pt on RA throughout, SpO2 remained 90-94% during gait.  Stairs            Wheelchair Mobility    Modified Rankin (Stroke Patients Only)       Balance Overall balance assessment: Needs assistance Sitting-balance support: Feet supported Sitting balance-Leahy Scale: Good     Standing balance support: During functional activity;Bilateral upper extremity supported Standing balance-Leahy Scale: Fair                               Pertinent Vitals/Pain Pain Assessment: Faces Faces Pain Scale: Hurts a little bit Pain Location: abdomen (nausea) Pain Descriptors / Indicators:  ((nausea)) Pain Intervention(s): Limited activity within patient's  tolerance;Monitored during session;Repositioned    Home Living Family/patient expects to be discharged to:: Private residence Living Arrangements: Other relatives;Children;Spouse/significant other (sister live with her) Available Help at Discharge: Family;Available PRN/intermittently Type of Home: House Home Access: Stairs to enter Entrance  Stairs-Rails: Right Entrance Stairs-Number of Steps: 2+1 Home Layout: Two level;Bed/bath upstairs Home Equipment: Walker - 2 wheels;Cane - single point      Prior Function Level of Independence: Independent with assistive device(s)         Comments: pt using SPC for mobility in home and community. uses scooter at grocery store due to distance.     Hand Dominance   Dominant Hand: Right    Extremity/Trunk Assessment   Upper Extremity Assessment Upper Extremity Assessment: Overall WFL for tasks assessed    Lower Extremity Assessment Lower Extremity Assessment: Generalized weakness    Cervical / Trunk Assessment Cervical / Trunk Assessment: Other exceptions Cervical / Trunk Exceptions: habitus  Communication   Communication: No difficulties  Cognition Arousal/Alertness: Awake/alert Behavior During Therapy: WFL for tasks assessed/performed Overall Cognitive Status: Within Functional Limits for tasks assessed                                        General Comments      Exercises     Assessment/Plan    PT Assessment Patient needs continued PT services  PT Problem List Decreased strength;Decreased activity tolerance;Decreased balance;Decreased mobility;Decreased knowledge of use of DME;Decreased safety awareness;Decreased knowledge of precautions;Obesity       PT Treatment Interventions DME instruction;Gait training;Stair training;Functional mobility training;Therapeutic activities;Therapeutic exercise;Balance training;Patient/family education    PT Goals (Current goals can be found in the Care Plan section)  Acute Rehab PT Goals Patient Stated Goal: to stop feeling SOB with activity PT Goal Formulation: With patient Time For Goal Achievement: 09/09/20 Potential to Achieve Goals: Good    Frequency Min 3X/week   Barriers to discharge        Co-evaluation               AM-PAC PT "6 Clicks" Mobility  Outcome Measure Help needed  turning from your back to your side while in a flat bed without using bedrails?: None Help needed moving from lying on your back to sitting on the side of a flat bed without using bedrails?: A Little Help needed moving to and from a bed to a chair (including a wheelchair)?: A Little Help needed standing up from a chair using your arms (e.g., wheelchair or bedside chair)?: A Little Help needed to walk in hospital room?: A Little Help needed climbing 3-5 steps with a railing? : A Lot 6 Click Score: 18    End of Session Equipment Utilized During Treatment: Gait belt Activity Tolerance: Patient tolerated treatment well Patient left: in chair;with call bell/phone within reach Nurse Communication: Mobility status PT Visit Diagnosis: Muscle weakness (generalized) (M62.81);Difficulty in walking, not elsewhere classified (R26.2)    Time: 7616-0737 PT Time Calculation (min) (ACUTE ONLY): 31 min   Charges:   PT Evaluation $PT Eval Low Complexity: 1 Low PT Treatments $Gait Training: 8-22 mins        Verner Mould, DPT Acute Rehabilitation Services  Office [phone] Pager [phone]  08/26/2020 2:50 PM

## 2020-08-26 NOTE — H&P (Addendum)
History and Physical        Hospital Admission Note Date: 08/26/2020  Patient name: Cheryl Blankenship Medical record number: [id number] Date of birth: 1951-07-19 Age: 69 y.o. Gender: female  PCP: [name], PA-C  Patient coming from: home   Chief Complaint    Chief Complaint  Patient presents with  . 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  Dyspnea resolves with rest, worse with lying flat and is sleeping with more pillows lately.  Also with lower extremity swelling for the past 2 to 3 weeks with abdominal swelling and distention.  Had a 4 pound weight gain at her last oncology visit on Friday.  Nonproductive cough as well.  PCP tried an albuterol inhaler without improvement.  Has been having intermittent episodes of bilateral chest pain which was initially thought to be due to GERD but did not improve with a medication prescribed by her PCP.  Patient was seen by cardiology on 07/15/2020 at which point she seems euvolemic, continue on Lasix and SGLT2 inhibitor and her chest pain was thought to be noncardiac in etiology and her shortness of breath was suspected to be related to her morbid obesity, heart failure and deconditioning. Admits to wheezing which is new. No history of tobacco use. She states that she was unable to eat much for Thanksgiving or on her birthday this past weekend since she was not feeling well and so does not feel she had too much salt.  Currently, she feels a bit better after getting the lasix but not at baseline. Still does not feel like she can lay flat.    ED Course: Afebrile, hypertensive, on  room air.  BMP unremarkable, troponin 7->6, BNP, Hb 11.2, COVID-19 and flu negative. Notable Imaging: Initial CXR on 12/7 with mild CHF, follow-up CXR. Patient received Lasix 40 mg IV x1, Lopressor and amlodipine.    Vitals:   08/26/20 0700 08/26/20 0800  BP: (!) 149/75 (!) 182/99  Pulse: 63 66  Resp: (!) 21 18  Temp:    SpO2: 93% 97%     Review of Systems:  Review of Systems  All other systems reviewed and are negative.   Medical/Social/Family History   Past Medical History: Past Medical History:  Diagnosis Date  . Anxiety   . Breast cancer ([place])   . CHF (congestive heart failure) ([place])   . Diabetes mellitus without complication ([place])    type 2  . Hypertension     Past Surgical History:  Procedure Laterality Date  . APPENDECTOMY    . CHOLECYSTECTOMY    . COLONOSCOPY    . EYE SURGERY     cataracts  . MULTIPLE TOOTH EXTRACTIONS    . PORTACATH PLACEMENT N/A 08/04/2020   Procedure: INSERTION PORT-A-CATH WITH ULTRASOUND GUIDANCE;  Surgeon: [name], MD;  Location: [facility];  Service: General;  Laterality: N/A;  . STOMACH SURGERY  Medications: Prior to Admission medications   Medication Sig Start Date End Date Taking? Authorizing Provider  Accu-Chek FastClix Lancets MISC  08/14/20   [provider]  amLODipine (NORVASC) 10 MG tablet Take 10 mg by mouth daily. 10/03/19 07/30/20  [provider]  aspirin 81 MG chewable tablet Chew 81 mg by mouth daily. Patient not taking: Reported on 08/18/2020    [provider]  Cholecalciferol 25 MCG (1000 UT) capsule Take 1,000 Units by mouth daily. 06/25/20   [provider]  diphenoxylate-atropine (LOMOTIL) 2.5-0.025 MG tablet Take 1-2 tablets by mouth 4 (four) times daily as needed for diarrhea or loose stools. 08/05/20   [name], MD  DULoxetine (CYMBALTA) 30 MG capsule Take 30 mg by mouth daily. 04/21/20   [provider]  empagliflozin (JARDIANCE) 25 MG TABS tablet Take 25 mg by  mouth daily. 04/02/20   [provider]  fluorometholone (FML) 0.1 % ophthalmic suspension Place 1 drop into both eyes daily. 06/09/20   [provider]  folic acid (FOLVITE) 1 MG tablet Take 1 tablet by mouth daily. 07/14/20   [provider]  furosemide (LASIX) 40 MG tablet Take 40 mg by mouth daily. 06/24/20   [provider]  gabapentin (NEURONTIN) 400 MG capsule Take 400 mg by mouth 3 (three) times daily.  04/21/20   [provider]  glipiZIDE (GLUCOTROL XL) 5 MG 24 hr tablet Take 5 mg by mouth daily with breakfast.  06/24/20   [provider]  HYDROcodone-acetaminophen (NORCO/VICODIN) 5-325 MG tablet Take 1 tablet by mouth every 6 (six) hours as needed for moderate pain. 08/04/20   [name], MD  insulin glargine (LANTUS SOLOSTAR) 100 UNIT/ML Solostar Pen Inject 70 Units into the skin 2 (two) times daily.  04/20/20   [provider]  insulin regular (NOVOLIN R) 100 units/mL injection Inject 0-22 Units into the skin 3 (three) times daily before meals. Dose per sliding scale 01/29/20   [provider]  losartan (COZAAR) 100 MG tablet Take 100 mg by mouth daily. 06/24/20   [provider]  metFORMIN (GLUCOPHAGE) 1000 MG tablet Take 1,000 mg by mouth daily with supper.  Patient not taking: Reported on 08/18/2020 04/21/20   [provider]  metFORMIN (GLUCOPHAGE) 500 MG tablet Take 500 mg by mouth daily with breakfast. Patient not taking: Reported on 08/18/2020 06/25/20   [provider]  metoprolol tartrate (LOPRESSOR) 50 MG tablet Take 50 mg by mouth in the morning and at bedtime. 06/29/20   [provider]  NOVOLOG FLEXPEN 100 UNIT/ML FlexPen  08/10/20   [provider]  Omega-3 Fatty Acids (FISH OIL) 1000 MG CAPS Take 1,000 mg by mouth daily.    [provider]  ondansetron (ZOFRAN-ODT) 4 MG disintegrating tablet Take 4 mg by mouth 2 (two) times daily as needed for nausea or  vomiting.  04/22/20   [provider]  oxyCODONE (OXY IR/ROXICODONE) 5 MG immediate release tablet Take 1-2 tablets (5-10 mg total) by mouth every 8 (eight) hours as needed for severe pain. 08/11/20   [name], MD  potassium chloride (KLOR-CON) 10 MEQ tablet Take 20 mEq by mouth daily. DAILY WITH LASIX    [provider]  pregabalin (LYRICA) 75 MG capsule Take 75 mg by mouth at bedtime.  12/04/19   [provider]  RESTASIS 0.05 % ophthalmic emulsion Place 1 drop into both eyes 2 (two) times daily. 07/13/20   [provider]  rosuvastatin (CRESTOR) 5 MG  tablet Take 5 mg by mouth at bedtime. 06/25/20   [provider]  tiZANidine (ZANAFLEX) 4 MG tablet Take 4 mg by mouth 2 (two) times daily as needed for muscle spasms.  06/23/20   [provider]  vitamin E (VITAMIN E) 180 MG (400 UNITS) capsule Take 400 Units by mouth daily.    [provider]  prochlorperazine (COMPAZINE) 10 MG tablet Take 1 tablet (10 mg total) by mouth every 6 (six) hours as needed (Nausea or vomiting). 07/22/20 08/18/20  [name], MD    Allergies:   Allergies  Allergen Reactions  . Hydrochlorothiazide Swelling    tongue  . Lisinopril Swelling    Angioedema. Tolerates Losartan.  . Coconut Oil Hives    Social History:  reports that she has quit smoking. She has a 90.00 pack-year smoking history. She has never used smokeless tobacco. She reports previous alcohol use. She reports that she does not use drugs.  Family History: Family History  Problem Relation Age of Onset  . Heart attack Sister   . Diabetes Sister   . Heart attack Mother   . Ovarian cancer Mother 58  . Heart attack Brother   . Cancer Niece        bone cancer   . Cancer Cousin        unknown type cancer   . Cancer Cousin        metastatic cancer to bone      Objective   Physical Exam: Blood pressure (!) 182/99, pulse 66, temperature 98.8 F (37.1 C), temperature source Oral, resp.  rate 18, SpO2 97 %.  Physical Exam Vitals and nursing note reviewed.  Constitutional:      Appearance: Normal appearance.  HENT:     Head: Normocephalic and atraumatic.  Eyes:     Conjunctiva/sclera: Conjunctivae normal.  Cardiovascular:     Rate and Rhythm: Normal rate and regular rhythm.  Pulmonary:     Effort: Pulmonary effort is normal.     Breath sounds: Normal breath sounds. No wheezing or rales.  Abdominal:     General: Abdomen is flat.     Palpations: Abdomen is soft.  Musculoskeletal:        General: No swelling or tenderness.  Skin:    Coloration: Skin is not jaundiced or pale.  Neurological:     Mental Status: She is alert. Mental status is at baseline.  Psychiatric:        Mood and Affect: Mood normal.        Behavior: Behavior normal.     LABS on Admission: I have personally reviewed all the labs and imaging below    Basic Metabolic Panel: Recent Labs  Lab 08/25/20 2350  NA 141  K 3.7  CL 104  CO2 29  GLUCOSE 143*  BUN 17  CREATININE 0.94  CALCIUM 10.3   Liver Function Tests: No results for input(s): AST, ALT, ALKPHOS, BILITOT, PROT, ALBUMIN in the last 168 hours. No results for input(s): LIPASE, AMYLASE in the last 168 hours. No results for input(s): AMMONIA in the last 168 hours. CBC: Recent Labs  Lab 08/25/20 2350  WBC 9.4  HGB 11.2*  HCT 35.9*  MCV 89.8  PLT 329   Cardiac Enzymes: No results for input(s): CKTOTAL, CKMB, CKMBINDEX, TROPONINI in the last 168 hours. BNP: Invalid input(s): POCBNP CBG: No results for input(s): GLUCAP in the last 168 hours.  Radiological Exams on Admission:  DG Chest 2 View  Result Date: 08/25/2020 CLINICAL  DATA:  Patient arrived stating that she is a chemo patient and feeling short of breath with exertion since the weekend. History of CHF, complaints of increased leg swelling. sob EXAM: CHEST - 2 VIEW COMPARISON:  08/25/2019 FINDINGS: 1 Port in the anterior chest wall with tip in distal SVC. Normal  cardiac silhouette. No effusion, infiltrate pneumothorax. Degenerative osteophytosis of the spine. IMPRESSION: No acute cardiopulmonary process. Electronically Signed   By: Suzy Bouchard M.D.   On: 08/25/2020 20:03   DG Chest 2 View  Result Date: 08/25/2020 CLINICAL DATA:  Wheezing.  Shortness of breath. EXAM: CHEST - 2 VIEW COMPARISON:  CT 08/07/2020.  Chest x-ray 08/04/2020. FINDINGS: PowerPort catheter noted in stable position. Cardiomegaly with mild pulmonary venous congestion and bilateral interstitial prominence. Mild CHF could present this fashion. Pneumonitis cannot be excluded. No prominent pleural effusion. No pneumothorax. No acute bony abnormality identified. Degenerative change thoracic spine with thoracic spine scoliosis. Surgical clips right upper quadrant. IMPRESSION: 1. PowerPort catheter stable position. 2. Cardiomegaly with mild pulmonary venous congestion and bilateral interstitial prominence. Mild CHF could present this fashion. Pneumonitis cannot be excluded. Electronically Signed   By: Marcello Moores  Register   On: 08/25/2020 14:50      EKG: Independently reviewed    A & P   Principal Problem:   Exertional dyspnea Active Problems:   Malignant neoplasm of upper-outer quadrant of left breast in female, estrogen receptor positive (HCC)   Essential hypertension   Acute diastolic CHF (congestive heart failure) (H[facility])   Obesity, diabetes, and hypertension syndrome ([place])   1. DOE possibly secondary to mild diastolic heart failure exacerbation and/or deconditioning a. Brief episode of hypoxia in the ED, currently on room air b. Could have been exacerbated by chemo? c. Echo 07/31/20: EF 65-70% otherwise unremarkable d. not back to baseline but did have improved symptoms with Lasix e. Seen by cardio 10/27: normal stress and echo in 08/2019 in [place], [place] f. Daily weights and Intake/Output g. telemetry h. Continue Lasix BID, likely only for today and probably DC tomorrow AM if  improved i. PT eval  2. Noncardiac Chest pain, resolved  3. Hypertension a. Continue home meds  4. Diabetes with neuropathy a. Basal-Bolus Dosing  b. Continue home neuropathic meds  5. Breast cancer a. Follows with Dr. Burr Medico   6. Obesity a. There is no height or weight on file to calculate BMI.    DVT prophylaxis: lovenox   Code Status: Full Code  Diet: heart healthy/carb modified Family Communication: Admission, patients condition and plan of care including tests being ordered have been discussed with the patient who indicates understanding and agrees with the plan and Code Status. Disposition Plan: The appropriate patient status for this patient is OBSERVATION. Observation status is judged to be reasonable and necessary in order to provide the required intensity of service to ensure the patient's safety. The patient's presenting symptoms, physical exam findings, and initial radiographic and laboratory data in the context of their medical condition is felt to place them at decreased risk for further clinical deterioration. Furthermore, it is anticipated that the patient will be medically stable for discharge from the hospital within 2 midnights of admission. The following factors support the patient status of observation.   " The patient's presenting symptoms include DOE. " The physical exam findings include unremarkable. " The initial radiographic and laboratory data are mild CHF on xray.    Status is: Observation  The patient remains OBS appropriate and will d/c before 2 midnights.  Dispo: The patient is from: Home              Anticipated d/c is to: Home              Anticipated d/c date is: 1 day              Patient currently is not medically stable to d/c.     Consultants  . none  Procedures  . none  Time Spent on Admission: 56 minutes    Harold Hedge, DO [facility]  08/26/2020, 8:11 AM

## 2020-08-26 NOTE — Progress Notes (Signed)
Cheryl Blankenship   DOB:Apr 17, 1951   SW#:[id number]   DUK#:[id number]  Oncology follow up   Subjective: Patient is well-known to me, under my care for her recently diagnosed breast cancer.  She received 1 cycle of neoadjuvant chemotherapy, and developed significant diarrhea, nausea, and required multiple IV fluids.  She was admitted for dyspnea and CHF exacerbation.  She does not feel better today, however still not able to lay flat.  Is eating well, no other new complaints.   Objective:  Vitals:   08/26/20 1222 08/26/20 1644  BP: (!) 168/84 133/69  Pulse: 62 67  Resp: (!) 23 17  Temp: 97.8 F (36.6 C) 98.1 F (36.7 C)  SpO2: 97% 93%    Body mass index is 38.44 kg/m.  Intake/Output Summary (Last 24 hours) at 08/26/2020 1723 Last data filed at 08/26/2020 1238 Gross per 24 hour  Intake 740 ml  Output --  Net 740 ml     Sclerae unicteric  Oropharynx clear  No peripheral adenopathy  Lungs clear -- no rales or rhonchi  Heart regular rate and rhythm  Abdomen benign  MSK no focal spinal tenderness, no peripheral edema  Neuro nonfocal    CBG (last 3)  Recent Labs    08/26/20 0924 08/26/20 1144 08/26/20 1641  GLUCAP 147* 172* 197*     Labs:  Urine Studies No results for input(s): UHGB, CRYS in the last 72 hours.  Invalid input(s): UACOL, UAPR, USPG, UPH, UTP, UGL, UKET, UBIL, UNIT, UROB, ULEU, UEPI, UWBC, URBC, UBAC, CAST, UCOM, BILUA  Basic Metabolic Panel: Recent Labs  Lab 08/25/20 2350 08/26/20 1014  NA 141 139  K 3.7 3.3*  CL 104 101  CO2 29 26  GLUCOSE 143* 154*  BUN 17 14  CREATININE 0.94 0.73  CALCIUM 10.3 10.0   GFR Estimated Creatinine Clearance: 76.3 mL/min (by C-G formula based on SCr of 0.73 mg/dL). Liver Function Tests: Recent Labs  Lab 08/26/20 1014  AST 14*  ALT 16  ALKPHOS 63  BILITOT 0.6  PROT 7.2  ALBUMIN 3.9   No results for input(s): LIPASE, AMYLASE in the last 168 hours. No results for input(s): AMMONIA in the last 168  hours. Coagulation profile No results for input(s): INR, PROTIME in the last 168 hours.  CBC: Recent Labs  Lab 08/25/20 2350 08/26/20 1014  WBC 9.4 9.2  HGB 11.2* 11.4*  HCT 35.9* 35.5*  MCV 89.8 88.3  PLT 329 335   Cardiac Enzymes: No results for input(s): CKTOTAL, CKMB, CKMBINDEX, TROPONINI in the last 168 hours. BNP: Invalid input(s): POCBNP CBG: Recent Labs  Lab 08/26/20 0924 08/26/20 1144 08/26/20 1641  GLUCAP 147* 172* 197*   D-Dimer No results for input(s): DDIMER in the last 72 hours. Hgb A1c Recent Labs    08/26/20 1014  HGBA1C 9.8*   Lipid Profile No results for input(s): CHOL, HDL, LDLCALC, TRIG, CHOLHDL, LDLDIRECT in the last 72 hours. Thyroid function studies No results for input(s): TSH, T4TOTAL, T3FREE, THYROIDAB in the last 72 hours.  Invalid input(s): FREET3 Anemia work up No results for input(s): VITAMINB12, FOLATE, FERRITIN, TIBC, IRON, RETICCTPCT in the last 72 hours. Microbiology Recent Results (from the past 240 hour(s))  Resp Panel by RT-PCR (Flu A&B, Covid) Nasopharyngeal Swab     Status: None   Collection Time: 08/26/20  4:15 AM   Specimen: Nasopharyngeal Swab; Nasopharyngeal(NP) swabs in vial transport medium  Result Value Ref Range Status   SARS Coronavirus 2 by RT PCR NEGATIVE NEGATIVE Final  Comment: (NOTE) SARS-CoV-2 target nucleic acids are NOT DETECTED.  The SARS-CoV-2 RNA is generally detectable in upper respiratory specimens during the acute phase of infection. The lowest concentration of SARS-CoV-2 viral copies this assay can detect is 138 copies/mL. A negative result does not preclude SARS-Cov-2 infection and should not be used as the sole basis for treatment or other patient management decisions. A negative result may occur with  improper specimen collection/handling, submission of specimen other than nasopharyngeal swab, presence of viral mutation(s) within the areas targeted by this assay, and inadequate number of  viral copies(<138 copies/mL). A negative result must be combined with clinical observations, patient history, and epidemiological information. The expected result is Negative.  Fact Sheet for Patients:  EntrepreneurPulse.com.au  Fact Sheet for Healthcare Providers:  IncredibleEmployment.be  This test is no t yet approved or cleared by the [place] FDA and  has been authorized for detection and/or diagnosis of SARS-CoV-2 by FDA under an Emergency Use Authorization (EUA). This EUA will remain  in effect (meaning this test can be used) for the duration of the COVID-19 declaration under Section 564(b)(1) of the Act, 21 U.S.C.section 360bbb-3(b)(1), unless the authorization is terminated  or revoked sooner.       Influenza A by PCR NEGATIVE NEGATIVE Final   Influenza B by PCR NEGATIVE NEGATIVE Final    Comment: (NOTE) The Xpert Xpress SARS-CoV-2/FLU/RSV plus assay is intended as an aid in the diagnosis of influenza from Nasopharyngeal swab specimens and should not be used as a sole basis for treatment. Nasal washings and aspirates are unacceptable for Xpert Xpress SARS-CoV-2/FLU/RSV testing.  Fact Sheet for Patients: EntrepreneurPulse.com.au  Fact Sheet for Healthcare Providers: IncredibleEmployment.be  This test is not yet approved or cleared by the [place] FDA and has been authorized for detection and/or diagnosis of SARS-CoV-2 by FDA under an Emergency Use Authorization (EUA). This EUA will remain in effect (meaning this test can be used) for the duration of the COVID-19 declaration under Section 564(b)(1) of the Act, 21 U.S.C. section 360bbb-3(b)(1), unless the authorization is terminated or revoked.  Performed at [facility], [place] [address] [place], [place] [address]       Studies:  DG Chest 2 View  Result Date: 08/25/2020 CLINICAL DATA:  Patient arrived stating  that she is a chemo patient and feeling short of breath with exertion since the weekend. History of CHF, complaints of increased leg swelling. sob EXAM: CHEST - 2 VIEW COMPARISON:  08/25/2019 FINDINGS: 1 Port in the anterior chest wall with tip in distal SVC. Normal cardiac silhouette. No effusion, infiltrate pneumothorax. Degenerative osteophytosis of the spine. IMPRESSION: No acute cardiopulmonary process. Electronically Signed   By: Suzy Bouchard M.D.   On: 08/25/2020 20:03    Assessment: 69 y.o. female   1.  Diastolic congestive heart failure 2.  Hypertension 3. DM with neuropathy 4. Breast cancer stage II, triple positive, s/p one cycle neoadjuvant chemo  5. Obesity   Plan:  -lab and images reviewed -she is responding to diuretics, overall feel better  -she is scheduled for chemo next week, and I plan to change it to [place] which she should be able to tolerate better. If she requires longer hospital course for CHF, we can postpone her chemo  -will be cautious about IVF infusion after chemo in future  -please call me if needed.    [name], MD 08/26/2020  5:23 PM

## 2020-08-26 NOTE — ED Notes (Signed)
Pt ambulated from Rm 12 down to 16-22 Nurse's station and back. Lowest O2 sat noted was 91% once pt arrived back at Rm 12. Pt noted some shortness of breath at 9-12 nurses station and had to take a quick break. Pt hooked back up for updated vital signs once she was sitting on the bed.

## 2020-08-27 ENCOUNTER — Inpatient Hospital Stay: Payer: Medicare HMO | Admitting: Genetic Counselor

## 2020-08-27 DIAGNOSIS — I5031 Acute diastolic (congestive) heart failure: Secondary | ICD-10-CM | POA: Diagnosis not present

## 2020-08-27 DIAGNOSIS — E1169 Type 2 diabetes mellitus with other specified complication: Secondary | ICD-10-CM | POA: Diagnosis not present

## 2020-08-27 DIAGNOSIS — E8779 Other fluid overload: Secondary | ICD-10-CM

## 2020-08-27 DIAGNOSIS — E669 Obesity, unspecified: Secondary | ICD-10-CM | POA: Diagnosis not present

## 2020-08-27 DIAGNOSIS — I152 Hypertension secondary to endocrine disorders: Secondary | ICD-10-CM

## 2020-08-27 DIAGNOSIS — R0609 Other forms of dyspnea: Secondary | ICD-10-CM | POA: Diagnosis not present

## 2020-08-27 DIAGNOSIS — E1159 Type 2 diabetes mellitus with other circulatory complications: Secondary | ICD-10-CM

## 2020-08-27 LAB — BASIC METABOLIC PANEL
Anion gap: 8 (ref 5–15)
BUN: 27 mg/dL — ABNORMAL HIGH (ref 8–23)
CO2: 29 mmol/L (ref 22–32)
Calcium: 10.1 mg/dL (ref 8.9–10.3)
Chloride: 100 mmol/L (ref 98–111)
Creatinine, Ser: 1.24 mg/dL — ABNORMAL HIGH (ref 0.44–1.00)
GFR, Estimated: 48 mL/min — ABNORMAL LOW (ref >60)
Glucose, Bld: 242 mg/dL — ABNORMAL HIGH (ref 70–99)
Potassium: 4.5 mmol/L (ref 3.5–5.1)
Sodium: 137 mmol/L (ref 135–145)

## 2020-08-27 LAB — GLUCOSE, CAPILLARY
Glucose-Capillary: 226 mg/dL — ABNORMAL HIGH (ref 70–99)
Glucose-Capillary: 161 mg/dL — ABNORMAL HIGH (ref 70–99)

## 2020-08-27 LAB — CBC
HCT: 37.5 % (ref 36.0–46.0)
Hemoglobin: 11.7 g/dL — ABNORMAL LOW (ref 12.0–15.0)
MCH: 27.9 pg (ref 26.0–34.0)
MCHC: 31.2 g/dL (ref 30.0–36.0)
MCV: 89.3 fL (ref 80.0–100.0)
Platelets: 341 10*3/uL (ref 150–400)
RBC: 4.2 MIL/uL (ref 3.87–5.11)
RDW: 13.2 % (ref 11.5–15.5)
WBC: 9.8 10*3/uL (ref 4.0–10.5)
nRBC: 0 % (ref 0.0–0.2)

## 2020-08-27 MED ORDER — LANTUS SOLOSTAR 100 UNIT/ML ~~LOC~~ SOPN
35.0000 [IU] | PEN_INJECTOR | Freq: Two times a day (BID) | SUBCUTANEOUS | 11 refills | Status: DC
Start: 2020-08-27 — End: 2021-09-01

## 2020-08-27 NOTE — Care Management Obs Status (Signed)
[facility] NOTIFICATION   Patient Details  Name: Cheryl Blankenship MRN: [id number] Date of Birth: 13-Dec-1950   Medicare Observation Status Notification Given:  Yes    MahabirJuliann Pulse, RN 08/27/2020, 10:09 AM

## 2020-08-27 NOTE — Progress Notes (Signed)
Provided and explained discharge instructions to patient. Patient verbalized understanding.  Cheryl Blankenship

## 2020-08-27 NOTE — TOC Transition Note (Signed)
Transition of Care [facility]) - CM/SW Discharge Note   Patient Details  Name: Cheryl Blankenship MRN: [id number] Date of Birth: 09-Nov-1950  [facility] CM/SW Contact:  Dessa Phi, RN Phone Number: 08/27/2020, 12:25 PM   Clinical Narrative: [place] set up.No further CM needs.       Final next level of care: [place] Barriers to Discharge: No Barriers Identified   Patient Goals and CMS Choice Patient states their goals for this hospitalization and ongoing recovery are:: go home CMS Medicare.gov Compare Post Acute Care list provided to:: Patient Choice offered to / list presented to : Patient  Discharge Placement                       Discharge Plan and Services   Discharge Planning Services: CM Consult Post Acute Care Choice: Home Health                    HH Arranged: PT [place]: [place] Date [place]: 08/27/20 Time [place] Agency Contacted: [phone] Representative spoke with at [place]: [place] ([place]) Interventions     Readmission Risk Interventions No flowsheet data found.

## 2020-08-27 NOTE — Discharge Instructions (Signed)
For your diabetes you should decrease your insulin/Lantus to 35 units twice a day to be given once in the morning and once at nighttime.  Please closely monitor by checking your blood sugar fasting in the morning right before bed and following up with your regular doctor in 1 week.  No other changes to your home medications were made

## 2020-08-27 NOTE — TOC Initial Note (Signed)
Transition of Care [facility]) - Initial/Assessment Note    Patient Details  Name: Cheryl Blankenship MRN: [id number] Date of Birth: 01-08-1951  Transition of Care [facility]) CM/SW Contact:    Dessa Phi, RN Phone Number: 08/27/2020, 10:06 AM  Clinical Narrative:HHPT-[facility] chosen rep Tommi Rumps aware.                    Expected Discharge Plan: [place] Barriers to Discharge: No Barriers Identified   Patient Goals and CMS Choice Patient states their goals for this hospitalization and ongoing recovery are:: go home CMS Medicare.gov Compare Post Acute Care list provided to:: Patient Choice offered to / list presented to : Patient  Expected Discharge Plan and Services Expected Discharge Plan: [place]   Discharge Planning Services: CM Consult Post Acute Care Choice: [place] arrangements for the past 2 months: Single Family Home Expected Discharge Date:  (unknown)                         HH Arranged: PT HH Agency: [place] Date [facility] Agency Contacted: 08/27/20 Time HH Agency Contacted: 45 Representative spoke with at [place]: Tommi Rumps  Prior Living Arrangements/Services Living arrangements for the past 2 months: [place] Lives with:: Self Patient language and need for interpreter reviewed:: Yes Do you feel safe going back to the place where you live?: Yes      Need for Family Participation in Patient Care: No (Comment) Care giver support system in place?: Yes (comment) Current home services: DME (rw) Criminal Activity/Legal Involvement Pertinent to Current Situation/Hospitalization: No - Comment as needed  Activities of Daily Living Home Assistive Devices/Equipment: CBG Meter,Eyeglasses,Walker (specify type),Cane (specify quad or straight) (single point cane, front wheeled walker) ADL Screening (condition at time of admission) Patient's cognitive ability adequate to safely complete daily activities?: Yes Is  the patient deaf or have difficulty hearing?: No Does the patient have difficulty seeing, even when wearing glasses/contacts?: Yes Does the patient have difficulty concentrating, remembering, or making decisions?: No Patient able to express need for assistance with ADLs?: Yes Does the patient have difficulty dressing or bathing?: Yes Independently performs ADLs?: No Communication: Independent (has troule getting words out at times-will nod to yes and no questions) Dressing (OT): Needs assistance Is this a change from baseline?: Change from baseline, expected to last >3 days Grooming: Independent Feeding: Independent Bathing: Needs assistance Is this a change from baseline?: Change from baseline, expected to last >3 days Toileting: Independent with device (comment) In/Out Bed: Independent Walks in Home: Independent with device (comment) Does the patient have difficulty walking or climbing stairs?: Yes (secondary to weakness and shortness of breath) Weakness of Legs: Both Weakness of Arms/Hands: None  Permission Sought/Granted Permission sought to share information with : Case Manager Permission granted to share information with : Yes, Verbal Permission Granted  Share Information with NAME: Case manager           Emotional Assessment Appearance:: Appears stated age Attitude/Demeanor/Rapport: Gracious Affect (typically observed): Accepting Orientation: : Oriented to Self,Oriented to Place,Oriented to  Time,Oriented to Situation Alcohol / Substance Use: Not Applicable Psych Involvement: No (comment)  Admission diagnosis:  Dehydration [E86.0] Exertional dyspnea [R06.00] Malignant neoplasm of upper-outer quadrant of left breast in female, estrogen receptor positive (HCC) [C50.412, Z17.0] Cardiac volume overload [E87.79] Patient Active Problem List   Diagnosis Date Noted  . Exertional dyspnea 08/26/2020  . Essential hypertension 08/26/2020  .  Acute diastolic CHF (congestive heart  failure) ([place]) 08/26/2020  . Obesity, diabetes, and hypertension syndrome ([place]) 08/26/2020  . Malignant neoplasm of upper-outer quadrant of left breast in female, estrogen receptor positive ([place]) 07/20/2020   PCP:  [name], PA-C Pharmacy:   [facility] - [place], [place] - [address] [place] [address] Phone: [phone] Fax: [phone]     Social Determinants of Health (SDOH) Interventions    Readmission Risk Interventions No flowsheet data found.

## 2020-08-27 NOTE — Discharge Summary (Signed)
Cheryl Blankenship OBS:[id number] DOB: 25-Jun-1951 DOA: 08/25/2020  PCP: [name], PA-C  Admit date: 08/25/2020 Discharge date: 08/27/2020  Admitted From: Home Disposition: Home  Recommendations for Outpatient Follow-up:  1. Follow up with PCP in 1-2 weeks 2. Has follow-up arranged with oncology 3. Home Lantus was decreased to 35 units twice daily, and encourage close follow-up with PCP   Home Health: PT Equipment/Devices: None Discharge Condition: Stable CODE STATUS: Full Diet recommendation: Diabetic/heart healthy  Brief/Interim Summary: History of present illness:  Cheryl Blankenship is a 69 year old female with medical history significant for breast cancer, type 2 diabetes, peripheral neuropathy, HTN, diastolic CHF who presented to the ED with chief complaint of dyspnea on exertion for the past week.  Worse with lying flat, with increased pillows, and abdominal swelling as well as 4 pound weight gain with nonproductive cough and minimal improvement with albuterol inhaler.  Patient was admitted with working diagnosis of CHF exacerbation in the setting of multiple IV fluids for chemotherapy infusion.  She responded well to IV Lasix diuresis.  Acute on chronic diastolic CHF exacerbation.  EF 65% on 07/31/2020.  Increase Lasix to IV 40 mg twice daily during hospital stay with significant improvement in dyspnea, and comfort level.  Chest x-ray on 12/6 was consistent with pulmonary venous congestion and bilateral interstitial prominence, repeat chest x-ray on 12/7 showed resolution.  Prior to discharge patient was able to ambulate without requiring any O2 and maintain SPO2 of 90-94% -On discharge can continue previous home regimen of Lasix -Her oncologist Dr. Burr Medico aware of her hospitalization, and will be cautious about IV fluid infusion after chemo in future -Home health PT arranged  Type 2 diabetes with peripheral neuropathy, poorly controlled with A1c of 9.8.  She was treated with  Lantus 30 units twice daily with average blood glucose of 200-2 20.  Her reported home regimen prior to admission was Lantus 70 units twice daily with reported episodes of hypoglycemia in setting of diminished appetite -On discharge instructed to cut back to half, with regimen of Lantus 35 units twice daily, closely monitor her blood sugar with close follow-up with her PCP in 1 week -Continue gabapentin       Consultations:  none  Procedures/Studies: none Subjective: "I feel so much better" and ready to home. Reports her breathing is much improved  Discharge Exam: Vitals:   08/27/20 0446 08/27/20 1126  BP: (!) 136/96 (!) 165/79  Pulse: 61 60  Resp: 20 15  Temp: 98.1 F (36.7 C) (!) 97.5 F (36.4 C)  SpO2: 92% 95%   Vitals:   08/26/20 2045 08/27/20 0446 08/27/20 0500 08/27/20 1126  BP: 139/75 (!) 136/96  (!) 165/79  Pulse: 71 61  60  Resp:  20  15  Temp: 98.6 F (37 C) 98.1 F (36.7 C)  (!) 97.5 F (36.4 C)  TempSrc: Oral     SpO2: 96% 92%  95%  Weight:   99.1 kg   Height:        General: Sitting comfortably in bedside chair Eyes: EOMI, anicteric ENT: Oral Mucosa clear and moist Cardiovascular: regular rate and rhythm, no murmurs, rubs or gallops, no edema, Respiratory: Normal respiratory effort on room air, lungs clear to auscultation bilaterally Abdomen: soft, non-distended, non-tender, normal bowel sounds Skin: No Rash Neurologic: Grossly no focal neuro deficit.Mental status AAOx3, speech normal, Psychiatric:Appropriate affect, and mood  Discharge Diagnoses:  Principal Problem:   Exertional dyspnea Active Problems:   Malignant neoplasm of upper-outer quadrant of left  breast in female, estrogen receptor positive ([place])   Essential hypertension   Acute diastolic CHF (congestive heart failure) (HCC)   Obesity, diabetes, and hypertension syndrome [facility])    Discharge Instructions  Discharge Instructions    Diet - low sodium heart healthy   Complete by:  As directed    Increase activity slowly   Complete by: As directed      Allergies as of 08/27/2020      Reactions   Hydrochlorothiazide Swelling   tongue   Lisinopril Swelling   Angioedema. Tolerates Losartan.   Coconut Oil Hives      Medication List    STOP taking these medications   pregabalin 75 MG capsule Commonly known as: LYRICA     TAKE these medications   Accu-Chek FastClix Lancets Misc   acetaminophen 500 MG tablet Commonly known as: TYLENOL Take 500 mg by mouth every 4 (four) hours as needed for mild pain.   albuterol 108 (90 Base) MCG/ACT inhaler Commonly known as: VENTOLIN HFA Inhale 1 puff into the lungs every 4 (four) hours as needed for wheezing or shortness of breath.   amLODipine 10 MG tablet Commonly known as: NORVASC Take 10 mg by mouth daily.   aspirin 81 MG chewable tablet Chew 81 mg by mouth daily.   Cholecalciferol 25 MCG (1000 UT) capsule Take 1,000 Units by mouth daily.   diphenoxylate-atropine 2.5-0.025 MG tablet Commonly known as: LOMOTIL Take 1-2 tablets by mouth 4 (four) times daily as needed for diarrhea or loose stools.   DULoxetine 30 MG capsule Commonly known as: CYMBALTA Take 30 mg by mouth daily.   feeding supplement (GLUCERNA 1.2 CAL) Liqd Take 237 mLs by mouth 3 (three) times daily with meals.   Fish Oil 1000 MG Caps Take 1,000 mg by mouth daily.   fluorometholone 0.1 % ophthalmic suspension Commonly known as: FML Place 1 drop into both eyes daily.   folic acid 1 MG tablet Commonly known as: FOLVITE Take 1 tablet by mouth daily.   furosemide 40 MG tablet Commonly known as: LASIX Take 40 mg by mouth daily.   gabapentin 400 MG capsule Commonly known as: NEURONTIN Take 400 mg by mouth 3 (three) times daily.   glipiZIDE 10 MG 24 hr tablet Commonly known as: GLUCOTROL XL Take 10 mg by mouth daily with breakfast.   HYDROcodone-acetaminophen 5-325 MG tablet Commonly known as: NORCO/VICODIN Take 1 tablet by  mouth every 6 (six) hours as needed for moderate pain.   insulin aspart 100 UNIT/ML injection Commonly known as: novoLOG Inject 2-10 Units into the skin 3 (three) times daily before meals. Per sliding scale:  150-199 = 2 units, 200-249 = 4 units, 250-299 = 6 units, 300-349 = 8 units, greater than350 = 10 units.   Lantus SoloStar 100 UNIT/ML Solostar Pen Generic drug: insulin glargine Inject 35 Units into the skin 2 (two) times daily. What changed: how much to take   losartan 100 MG tablet Commonly known as: COZAAR Take 100 mg by mouth daily.   metFORMIN 500 MG tablet Commonly known as: GLUCOPHAGE Take 500 mg by mouth 2 (two) times daily with a meal.   metoprolol tartrate 50 MG tablet Commonly known as: LOPRESSOR Take 50 mg by mouth in the morning and at bedtime.   ondansetron 4 MG disintegrating tablet Commonly known as: ZOFRAN-ODT Take 4 mg by mouth 2 (two) times daily as needed for nausea or vomiting.   oxyCODONE 5 MG immediate release tablet Commonly known as: Oxy IR/ROXICODONE  Take 1-2 tablets (5-10 mg total) by mouth every 8 (eight) hours as needed for severe pain.   potassium chloride 10 MEQ tablet Commonly known as: KLOR-CON Take 20 mEq by mouth daily. DAILY WITH LASIX   Restasis 0.05 % ophthalmic emulsion Generic drug: cycloSPORINE Place 1 drop into both eyes 2 (two) times daily.   rosuvastatin 5 MG tablet Commonly known as: CRESTOR Take 5 mg by mouth at bedtime.   tiZANidine 4 MG tablet Commonly known as: ZANAFLEX Take 4 mg by mouth 2 (two) times daily as needed for muscle spasms.   vitamin E 180 MG (400 UNITS) capsule Generic drug: vitamin E Take 400 Units by mouth daily.       Follow-up Information    [facility] Follow up.   Specialty: Home Health Services Why: [place] physical [facility] Contact information: [address] [place] [address] [phone]              Allergies  Allergen Reactions  .  Hydrochlorothiazide Swelling    tongue  . Lisinopril Swelling    Angioedema. Tolerates Losartan.  . Coconut Oil Hives        The results of significant diagnostics from this hospitalization (including imaging, microbiology, ancillary and laboratory) are listed below for reference.     Microbiology: Recent Results (from the past 240 hour(s))  Resp Panel by RT-PCR (Flu A&B, Covid) Nasopharyngeal Swab     Status: None   Collection Time: 08/26/20  4:15 AM   Specimen: Nasopharyngeal Swab; Nasopharyngeal(NP) swabs in vial transport medium  Result Value Ref Range Status   SARS Coronavirus 2 by RT PCR NEGATIVE NEGATIVE Final    Comment: (NOTE) SARS-CoV-2 target nucleic acids are NOT DETECTED.  The SARS-CoV-2 RNA is generally detectable in upper respiratory specimens during the acute phase of infection. The lowest concentration of SARS-CoV-2 viral copies this assay can detect is 138 copies/mL. A negative result does not preclude SARS-Cov-2 infection and should not be used as the sole basis for treatment or other patient management decisions. A negative result may occur with  improper specimen collection/handling, submission of specimen other than nasopharyngeal swab, presence of viral mutation(s) within the areas targeted by this assay, and inadequate number of viral copies(<138 copies/mL). A negative result must be combined with clinical observations, patient history, and epidemiological information. The expected result is Negative.  Fact Sheet for Patients:  EntrepreneurPulse.com.au  Fact Sheet for Healthcare Providers:  IncredibleEmployment.be  This test is no t yet approved or cleared by the [place] FDA and  has been authorized for detection and/or diagnosis of SARS-CoV-2 by FDA under an Emergency Use Authorization (EUA). This EUA will remain  in effect (meaning this test can be used) for the duration of the COVID-19 declaration under  Section 564(b)(1) of the Act, 21 U.S.C.section 360bbb-3(b)(1), unless the authorization is terminated  or revoked sooner.       Influenza A by PCR NEGATIVE NEGATIVE Final   Influenza B by PCR NEGATIVE NEGATIVE Final    Comment: (NOTE) The Xpert Xpress SARS-CoV-2/FLU/RSV plus assay is intended as an aid in the diagnosis of influenza from Nasopharyngeal swab specimens and should not be used as a sole basis for treatment. Nasal washings and aspirates are unacceptable for Xpert Xpress SARS-CoV-2/FLU/RSV testing.  Fact Sheet for Patients: EntrepreneurPulse.com.au  Fact Sheet for Healthcare Providers: IncredibleEmployment.be  This test is not yet approved or cleared by the [place] FDA and has been authorized for detection and/or diagnosis of  SARS-CoV-2 by FDA under an Emergency Use Authorization (EUA). This EUA will remain in effect (meaning this test can be used) for the duration of the COVID-19 declaration under Section 564(b)(1) of the Act, 21 U.S.C. section 360bbb-3(b)(1), unless the authorization is terminated or revoked.  Performed at [facility], [place] [address] [place], [place] [address]      Labs: BNP (last 3 results) Recent Labs    08/25/20 2326  BNP 90.3   Basic Metabolic Panel: Recent Labs  Lab 08/25/20 2350 08/26/20 1014 08/27/20 0524  NA 141 139 137  K 3.7 3.3* 4.5  CL 104 101 100  CO2 29 26 29   GLUCOSE 143* 154* 242*  BUN 17 14 27*  CREATININE 0.94 0.73 1.24*  CALCIUM 10.3 10.0 10.1   Liver Function Tests: Recent Labs  Lab 08/26/20 1014  AST 14*  ALT 16  ALKPHOS 63  BILITOT 0.6  PROT 7.2  ALBUMIN 3.9   No results for input(s): LIPASE, AMYLASE in the last 168 hours. No results for input(s): AMMONIA in the last 168 hours. CBC: Recent Labs  Lab 08/25/20 2350 08/26/20 1014 08/27/20 0524  WBC 9.4 9.2 9.8  HGB 11.2* 11.4* 11.7*  HCT 35.9* 35.5* 37.5  MCV 89.8 88.3 89.3  PLT  329 335 341   Cardiac Enzymes: No results for input(s): CKTOTAL, CKMB, CKMBINDEX, TROPONINI in the last 168 hours. BNP: Invalid input(s): POCBNP CBG: Recent Labs  Lab 08/26/20 1144 08/26/20 1641 08/26/20 2040 08/27/20 0728 08/27/20 1121  GLUCAP 172* 197* 165* 226* 161*   D-Dimer No results for input(s): DDIMER in the last 72 hours. Hgb A1c Recent Labs    08/26/20 1014  HGBA1C 9.8*   Lipid Profile No results for input(s): CHOL, HDL, LDLCALC, TRIG, CHOLHDL, LDLDIRECT in the last 72 hours. Thyroid function studies No results for input(s): TSH, T4TOTAL, T3FREE, THYROIDAB in the last 72 hours.  Invalid input(s): FREET3 Anemia work up No results for input(s): VITAMINB12, FOLATE, FERRITIN, TIBC, IRON, RETICCTPCT in the last 72 hours. Urinalysis No results found for: COLORURINE, APPEARANCEUR, [place], [place], GLUCOSEU, [place], [place], [place], PROTEINUR, UROBILINOGEN, NITRITE, LEUKOCYTESUR Sepsis Labs Invalid input(s): PROCALCITONIN,  WBC,  LACTICIDVEN Microbiology Recent Results (from the past 240 hour(s))  Resp Panel by RT-PCR (Flu A&B, Covid) Nasopharyngeal Swab     Status: None   Collection Time: 08/26/20  4:15 AM   Specimen: Nasopharyngeal Swab; Nasopharyngeal(NP) swabs in vial transport medium  Result Value Ref Range Status   SARS Coronavirus 2 by RT PCR NEGATIVE NEGATIVE Final    Comment: (NOTE) SARS-CoV-2 target nucleic acids are NOT DETECTED.  The SARS-CoV-2 RNA is generally detectable in upper respiratory specimens during the acute phase of infection. The lowest concentration of SARS-CoV-2 viral copies this assay can detect is 138 copies/mL. A negative result does not preclude SARS-Cov-2 infection and should not be used as the sole basis for treatment or other patient management decisions. A negative result may occur with  improper specimen collection/handling, submission of specimen other than nasopharyngeal swab, presence of viral mutation(s) within  the areas targeted by this assay, and inadequate number of viral copies(<138 copies/mL). A negative result must be combined with clinical observations, patient history, and epidemiological information. The expected result is Negative.  Fact Sheet for Patients:  EntrepreneurPulse.com.au  Fact Sheet for Healthcare Providers:  IncredibleEmployment.be  This test is no t yet approved or cleared by the [place] FDA and  has been authorized for detection and/or diagnosis of SARS-CoV-2 by FDA under  an Emergency Use Authorization (EUA). This EUA will remain  in effect (meaning this test can be used) for the duration of the COVID-19 declaration under Section 564(b)(1) of the Act, 21 U.S.C.section 360bbb-3(b)(1), unless the authorization is terminated  or revoked sooner.       Influenza A by PCR NEGATIVE NEGATIVE Final   Influenza B by PCR NEGATIVE NEGATIVE Final    Comment: (NOTE) The Xpert Xpress SARS-CoV-2/FLU/RSV plus assay is intended as an aid in the diagnosis of influenza from Nasopharyngeal swab specimens and should not be used as a sole basis for treatment. Nasal washings and aspirates are unacceptable for Xpert Xpress SARS-CoV-2/FLU/RSV testing.  Fact Sheet for Patients: EntrepreneurPulse.com.au  Fact Sheet for Healthcare Providers: IncredibleEmployment.be  This test is not yet approved or cleared by the [place] FDA and has been authorized for detection and/or diagnosis of SARS-CoV-2 by FDA under an Emergency Use Authorization (EUA). This EUA will remain in effect (meaning this test can be used) for the duration of the COVID-19 declaration under Section 564(b)(1) of the Act, 21 U.S.C. section 360bbb-3(b)(1), unless the authorization is terminated or revoked.  Performed at [facility], [place] [address], [place], [place] [address]      Time coordinating discharge: Over 30  minutes  SIGNED:   Desiree Hane, MD  [facility] 08/27/2020, 1:18 PM Pager   If 7PM-7AM, please contact night-coverage [email or web address] Password [email or web address]

## 2020-08-31 ENCOUNTER — Other Ambulatory Visit: Payer: Self-pay | Admitting: Nurse Practitioner

## 2020-08-31 DIAGNOSIS — C50412 Malignant neoplasm of upper-outer quadrant of left female breast: Secondary | ICD-10-CM

## 2020-08-31 NOTE — Progress Notes (Signed)
[place]   [phone] [phone]   Clinic Follow up Note   Patient Care Team: [name], PA-C as PCP - General (Internal Medicine) [name], RN as Oncology Nurse Navigator [name], RN as Oncology Nurse Navigator [name], MD as Consulting Physician (General Surgery) [name], MD as Consulting Physician (Hematology) [name], MD as Consulting Physician (Radiation Oncology) 09/01/2020  CHIEF COMPLAINT: Follow up left breast cancer   SUMMARY OF ONCOLOGIC HISTORY: Oncology History Overview Note  Cancer Staging Malignant neoplasm of upper-outer quadrant of left breast in female, estrogen receptor positive ([place]) Staging form: Breast, AJCC 8th Edition - Clinical stage from 07/16/2020: Stage IB (cT2, cN1, cM0, G2, ER+, PR+, HER2+) - Signed by [name], MD on 07/21/2020    Malignant neoplasm of upper-outer quadrant of left breast in female, estrogen receptor positive ([place])  07/01/2020 Mammogram   IMPRESSION: 1. There is a 12 cm span of highly suspicious calcifications involving the upper outer and upper inner quadrants of the left breast. On ultrasound, within this area of calcifications, there are confluent masses at 1 o'clock spanning at least 4.5 cm. An additional suspicious mass is seen in the left breast at 2 o'clock.  -Ultrasound targeted to the left breast at 1 o'clock, 5 cm from the nipple demonstrates a broad area of hypoechoic irregular tissue/confluent masses spanning at least 4.5 cm. There is a separate oval hypoechoic mass with indistinct margins at 2 o'clock, 7 cm from the nipple measuring 0.9 x 0.4 x 0.9 cm. Ultrasound of the left axilla demonstrates 2 abnormally thickened lymph nodes with cortices measuring 6-7 mm. There is an additional lymph node with a borderline cortex of 4 mm.   2. There are 2 lymph nodes with thickened cortices in the left axilla, and an additional lymph node with  a borderline thickened cortex.   07/16/2020 Cancer Staging   Staging form: Breast, AJCC 8th Edition - Clinical stage from 07/16/2020: Stage IIA (cT3, cN1, cM0, G2, ER+, PR+, HER2+) - Signed by [name], MD on 08/05/2020   07/16/2020 Initial Biopsy   Diagnosis 1. 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 Breast, left, needle core biopsy, 1 o'clock - INVASIVE DUCTAL CARCINOMA - SEE COMMENT 3. Lymph node, biopsy - METASTATIC CARCINOMA INVOLVING A LYMPH NODE - SEE COMMENT Microscopic Comment 1. Based on the biopsy, the ductal carcinoma in situ has a comedo pattern, high nuclear grade and measures 0.7 cm in greatest linear extent. Prognostic markers (ER/PR) are pending and will be reported in an addendum. 2. Based on the biopsy, the carcinoma appears Nottingham grade 2 of 3 and measures 1.5 cm in greatest linear extent. Prognostic markers (ER/PR/ki-67/HER2) are pending and will be reported in an addendum. Dr. Saralyn Pilar reviewed the case and agrees with the above diagnosis. These results were called to [facility] on July 17, 2020. 3. Based on the biopsy, the carcinoma appears measures 1.0 cm in greatest linear extent. Prognostic markers (ER/PR/ki-67/HER2) are pending and will be reported in an addendum.   07/16/2020 Receptors her2   1. PROGNOSTIC INDICATORS Results: IMMUNOHISTOCHEMICAL AND MORPHOMETRIC ANALYSIS PERFORMED MANUALLY Estrogen Receptor: 30%, POSITIVE, STRONG STAINING INTENSITY Progesterone Receptor: 5%, POSITIVE, STRONG STAINING INTENSITY  2. PROGNOSTIC INDICATORS Results: IMMUNOHISTOCHEMICAL AND MORPHOMETRIC ANALYSIS PERFORMED MANUALLY The tumor cells are POSITIVE for Her2 (3+). Estrogen Receptor: 85%, POSITIVE, STRONG STAINING INTENSITY Progesterone Receptor: 60%,  POSITIVE, STRONG STAINING INTENSITY Proliferation Marker Ki67: 25%  3.  PROGNOSTIC INDICATORS Results: IMMUNOHISTOCHEMICAL AND MORPHOMETRIC ANALYSIS PERFORMED MANUALLY The tumor cells are POSITIVE for Her2 (3+). Estrogen Receptor: 95%, POSITIVE, STRONG STAINING INTENSITY Progesterone Receptor: 45%, POSITIVE, STRONG STAINING INTENSITY Proliferation Marker Ki67: 30%   07/20/2020 Initial Diagnosis   Malignant neoplasm of upper-outer quadrant of left breast in female, estrogen receptor positive ([place])   07/31/2020 Echocardiogram   Baseline Echo  IMPRESSIONS     1. Left ventricular ejection fraction, by estimation, is 65 to 70%. The  left ventricle has normal function. The left ventricle has no regional  wall motion abnormalities. Left ventricular diastolic parameters are  indeterminate. The average left  ventricular global longitudinal strain is -17.1 %.   2. Right ventricular systolic function is normal. The right ventricular  size is normal. There is normal pulmonary artery systolic pressure.   3. The mitral valve is normal in structure. Trivial mitral valve  regurgitation. No evidence of mitral stenosis.   4. The aortic valve is normal in structure. Aortic valve regurgitation is  not visualized. No aortic stenosis is present.    07/31/2020 Breast MRI   IMPRESSION: 1. Extensive non mass enhancement within the left breast extending from the nipple to the chest wall. Overall findings are most compatible with extensive malignancy involving the majority of the left breast. 2. Indeterminate enhancing mass within the upper inner right breast. 3. Three morphologically abnormal lymph nodes within the left axilla. One of these nodes has been biopsied compatible with metastatic adenopathy.     08/03/2020 Imaging   Bone Scan whole body  IMPRESSION: Increased radiotracer uptake in the mid to lower lumbar regions of uncertain etiology. Correlation with radiography to assess for potential arthropathy in these areas advised. Increased uptake in major joints is  likely of arthropathic etiology. Increased uptake in the mid face is likely due to paranasal sinus disease. Distribution of radiotracer uptake in bony structures elsewhere unremarkable.   08/04/2020 Procedure   PAC placement by Dr [name]   08/05/2020 -  Neo-Adjuvant Anti-estrogen oral therapy   Neoadjuvant THP q3weeks for 4-6 cycles starting 08/05/20, followed by Herceptin or Kadcyla q3weeks to complete 1 year of treatment.        -Due to very poor toleration after C1 THP, I switched her to Kadcyla and Perjeta q3weeks starting 08/25/20.   08/07/2020 Imaging   CT CAP  IMPRESSION: 2.7 cm mass in the upper outer left breast, likely corresponding to the patient's known primary breast neoplasm.   8 mm short axis left axillary node, suspicious for nodal metastasis in this clinical context.   No findings suspicious for distant metastasis.   Endometrium is mildly prominent, measuring 11 mm. Correlate for vaginal bleeding and consider pelvic ultrasound for further evaluation if clinically warranted.   08/11/2020 Pathology Results   Diagnosis Breast, right, needle core biopsy, right breast - PAPILLARY LESION - SEE COMMENT Microscopic Comment The biopsy consists of a papillary lesion with a focal area of adenosis. Immunohistochemistry (SMM, calponin and p63) shows retention of the myoepithelial cell layer in the focus of adenosis. 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 These results were called to [facility] on August 12, 2020.   09/01/2020 -  Chemotherapy   The patient had ado-trastuzumab emtansine (KADCYLA) 360 mg in sodium chloride 0.9 % 250 mL chemo infusion, 3.6 mg/kg = 360 mg, Intravenous, Once, 0 of 5 cycles  for chemotherapy treatment.  CURRENT THERAPY: Neoadjuvant docetaxel/herceptin/perjeta q3 weeks starting 11/17, tolerated poorly and changed to perjeta and kadcyla to start 09/04/20   INTERVAL HISTORY: Ms. Winzer returns  for follow up as scheduled. She was last seen 11/30 for treatment discussion and supportive care. She consented to switch to [place] and perjeta. She was admitted for CHF exacerbation 12/7 - 12/9. She responded to diuresis and was discharged home.  She has become more active at home in the last few days, PT came yesterday and will be back Friday.  She still has exertional dyspnea and had an episode of wheezing when she was rushing to get ready this morning.  Ambulates with a cane.  She is moving to the first level of her home.  She continues to have diarrhea after eating, takes 1 Imodium per day and Lomotil twice daily.  She is drinking liquids.  She continues Lasix 40 mg daily.  Denies chest pain, fever, chills or other new concerns.    MEDICAL HISTORY:  Past Medical History:  Diagnosis Date  . Anxiety   . Breast cancer ([place])   . CHF (congestive heart failure) ([place])   . Diabetes mellitus without complication ([place])    type 2  . Hypertension     SURGICAL HISTORY: Past Surgical History:  Procedure Laterality Date  . APPENDECTOMY    . CHOLECYSTECTOMY    . COLONOSCOPY    . EYE SURGERY     cataracts  . MULTIPLE TOOTH EXTRACTIONS    . PORTACATH PLACEMENT N/A 08/04/2020   Procedure: INSERTION PORT-A-CATH WITH ULTRASOUND GUIDANCE;  Surgeon: [name], MD;  Location: [facility];  Service: General;  Laterality: N/A;  . STOMACH SURGERY      I have reviewed the social history and family history with the patient and they are unchanged from previous note.  ALLERGIES:  is allergic to hydrochlorothiazide, lisinopril, and coconut oil.  MEDICATIONS:  Current Outpatient Medications  Medication Sig Dispense Refill  . Accu-Chek FastClix Lancets MISC     . acetaminophen (TYLENOL) 500 MG tablet Take 500 mg by mouth every 4 (four) hours as needed for mild pain.    Marland Kitchen albuterol (VENTOLIN HFA) 108 (90 Base) MCG/ACT inhaler Inhale 1 puff into the lungs every 4 (four) hours as needed for wheezing or  shortness of breath.    Marland Kitchen aspirin 81 MG chewable tablet Chew 81 mg by mouth daily.     . Cholecalciferol 25 MCG (1000 UT) capsule Take 1,000 Units by mouth daily.    . diphenoxylate-atropine (LOMOTIL) 2.5-0.025 MG tablet Take 1-2 tablets by mouth 4 (four) times daily as needed for diarrhea or loose stools. 30 tablet 0  . DULoxetine (CYMBALTA) 30 MG capsule Take 30 mg by mouth daily.    . fluorometholone (FML) 0.1 % ophthalmic suspension Place 1 drop into both eyes daily.    . folic acid (FOLVITE) 1 MG tablet Take 1 tablet by mouth daily.    . furosemide (LASIX) 40 MG tablet Take 40 mg by mouth daily.    Marland Kitchen gabapentin (NEURONTIN) 400 MG capsule Take 400 mg by mouth 3 (three) times daily.     Marland Kitchen glipiZIDE (GLUCOTROL XL) 10 MG 24 hr tablet Take 10 mg by mouth daily with breakfast.     . HYDROcodone-acetaminophen (NORCO/VICODIN) 5-325 MG tablet Take 1 tablet by mouth every 6 (six) hours as needed for moderate pain. 15 tablet 0  . insulin aspart (NOVOLOG) 100 UNIT/ML injection Inject 2-10 Units into the skin 3 (three) times daily before  meals. Per sliding scale:  150-199 = 2 units, 200-249 = 4 units, 250-299 = 6 units, 300-349 = 8 units, greater than350 = 10 units.    . insulin glargine (LANTUS SOLOSTAR) 100 UNIT/ML Solostar Pen Inject 35 Units into the skin 2 (two) times daily. 15 mL 11  . losartan (COZAAR) 100 MG tablet Take 100 mg by mouth daily.    . metFORMIN (GLUCOPHAGE) 500 MG tablet Take 500 mg by mouth 2 (two) times daily with a meal.     . metoprolol tartrate (LOPRESSOR) 50 MG tablet Take 50 mg by mouth in the morning and at bedtime.    . Nutritional Supplements (FEEDING SUPPLEMENT, GLUCERNA 1.2 CAL,) LIQD Take 237 mLs by mouth 3 (three) times daily with meals.    . Omega-3 Fatty Acids (FISH OIL) 1000 MG CAPS Take 1,000 mg by mouth daily.    . ondansetron (ZOFRAN-ODT) 4 MG disintegrating tablet Take 4 mg by mouth 2 (two) times daily as needed for nausea or vomiting.     . potassium chloride  (KLOR-CON) 10 MEQ tablet Take 20 mEq by mouth daily. DAILY WITH LASIX    . RESTASIS 0.05 % ophthalmic emulsion Place 1 drop into both eyes 2 (two) times daily.    . rosuvastatin (CRESTOR) 5 MG tablet Take 5 mg by mouth at bedtime.    Marland Kitchen tiZANidine (ZANAFLEX) 4 MG tablet Take 4 mg by mouth 2 (two) times daily as needed for muscle spasms.     . vitamin E 180 MG (400 UNITS) capsule Take 400 Units by mouth daily.    Marland Kitchen amLODipine (NORVASC) 10 MG tablet Take 10 mg by mouth daily.    Marland Kitchen oxyCODONE (OXY IR/ROXICODONE) 5 MG immediate release tablet Take 1-2 tablets (5-10 mg total) by mouth every 8 (eight) hours as needed for severe pain. 15 tablet 0   No current facility-administered medications for this visit.    PHYSICAL EXAMINATION: ECOG PERFORMANCE STATUS: 1-2  Vitals:   09/01/20 0955  BP: (!) 179/84  Pulse: 72  Resp: 18  Temp: 98.2 F (36.8 C)  SpO2: 96%   O2 sat 93-97% RA on ambulation   Filed Weights   09/01/20 0955  Weight: 226 lb 1.6 oz (102.6 kg)    GENERAL:alert, no distress and comfortable SKIN: no rash  EYES: sclera clear LUNGS: clear with normal breathing effort HEART: regular rate & rhythm, mild bilateral lower extremity edema ABDOMEN:abdomen soft, non-tender and normal bowel sounds NEURO: alert & oriented x 3 with fluent speech, no focal motor/sensory deficits PAC without erythema   LABORATORY DATA:  I have reviewed the data as listed CBC Latest Ref Rng & Units 09/01/2020 08/27/2020 08/26/2020  WBC 4.0 - 10.5 K/uL 8.9 9.8 9.2  Hemoglobin 12.0 - 15.0 g/dL 10.3(L) 11.7(L) 11.4(L)  Hematocrit 36.0 - 46.0 % 32.3(L) 37.5 35.5(L)  Platelets 150 - 400 K/uL 277 341 335     CMP Latest Ref Rng & Units 09/01/2020 08/27/2020 08/26/2020  Glucose 70 - 99 mg/dL 293(H) 242(H) 154(H)  BUN 8 - 23 mg/dL 14 27(H) 14  Creatinine 0.44 - 1.00 mg/dL 1.02(H) 1.24(H) 0.73  Sodium 135 - 145 mmol/L 140 137 139  Potassium 3.5 - 5.1 mmol/L 3.8 4.5 3.3(L)  Chloride 98 - 111 mmol/L 108 100 101   CO2 22 - 32 mmol/L 30 29 26   Calcium 8.9 - 10.3 mg/dL 10.1 10.1 10.0  Total Protein 6.5 - 8.1 g/dL 6.9 - 7.2  Total Bilirubin 0.3 - 1.2 mg/dL 0.5 -  0.6  Alkaline Phos 38 - 126 U/L 67 - 63  AST 15 - 41 U/L 11(L) - 14(L)  ALT 0 - 44 U/L 17 - 16      RADIOGRAPHIC STUDIES: I have personally reviewed the radiological images as listed and agreed with the findings in the report. No results found.   ASSESSMENT & PLAN: Erika Slaby is a 69 y.o. female with    1.Malignant neoplasm of upper-outer quadrant of left breast, StageII, c(T2N1M0), ER+/PR+/HER2+, GradeII -She was diagnosed in 06/2020.She has 12cm calcification and 4.5cm mass in her left breast with2 abnormalLN. 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 -HerMRI Breast from 07/31/20 shows very extensive disease in her left breast and 3 abnormal LNs in the left axilla. There isindeterminate enhancing73mmass within the upper inner right breast.08/11/20 right breast biopsy showed benign papillary lesion. -Herbone scan from 08/03/20 shows uptake in her lumbar spine, but nonspecific. She has known lumbar back pain from arthritis. Her CT CAP from 08/07/20 showed known breast masses and left axillary LN andnodistant metastasis.  -Given theextensivedisease in her left breast, Dr. TGeorgette Doverhas recommended definitive surgery with mastectomy  -She started neoadjuvant chemoTHP q3weeks on11/17/21 which was dose reduced with C1. Tolerated poorly with body aches, n/v/d and switched to kadcyla +/- perjeta. She was hospitalized for CHF exacerbation before she could start it -recovering well, plan to start [place](insurance denied perjeta) 09/04/20   2. CHF -she required frequent IVF supportive care with cycle 1 THP and was hospitalized for fluid overload. She responded well to diuresis  -she has mild leg edema and exertional dyspnea, no  hypoxia. Exam otherwise benign. -she will see cardiology 12/20. Will monitor closely on subsequent treatment   Disposition:  Ms. SDelamarappears stable. She is recovering well from recent CHF exacerbation. She has mild exertional dyspnea and wheezing, she is also somewhat deconditioned from recent hospitalization. Today's exam is benign, no hypoxia. Continue lasix 40 mg daily and albuterol PRN. I recommend to increase home PT to 3 times per week if possible.   We reviewed symptom management for diarrhea, she will increase lomotil and imodium. She is otherwise doing well.   We discussed her treatment plan, insurance approval for kSteward Droneis pending. They denied addition of pertuzumab. We anticipate proceeding with kadcyla on 09/04/20.   Follow up with cycle 2 Kadcyla.   All questions were answered. The patient knows to call the clinic with any problems, questions or concerns. No barriers to learning were detected. Total encounter time was 30 minutes.      LAlla Feeling NP 09/01/20

## 2020-09-01 ENCOUNTER — Other Ambulatory Visit: Payer: Self-pay

## 2020-09-01 ENCOUNTER — Inpatient Hospital Stay (HOSPITAL_BASED_OUTPATIENT_CLINIC_OR_DEPARTMENT_OTHER): Payer: Medicare HMO | Admitting: Nurse Practitioner

## 2020-09-01 ENCOUNTER — Encounter: Payer: Self-pay | Admitting: Nurse Practitioner

## 2020-09-01 ENCOUNTER — Encounter: Payer: Self-pay | Admitting: *Deleted

## 2020-09-01 ENCOUNTER — Inpatient Hospital Stay: Payer: Medicare HMO

## 2020-09-01 VITALS — BP 179/84 | HR 72 | Temp 98.2°F | Resp 18 | Ht 63.0 in | Wt 226.1 lb

## 2020-09-01 DIAGNOSIS — C50412 Malignant neoplasm of upper-outer quadrant of left female breast: Secondary | ICD-10-CM

## 2020-09-01 DIAGNOSIS — Z170 Estrogen receptor positive status [ER+]: Secondary | ICD-10-CM

## 2020-09-01 DIAGNOSIS — Z5112 Encounter for antineoplastic immunotherapy: Secondary | ICD-10-CM | POA: Diagnosis present

## 2020-09-01 LAB — CMP (CANCER CENTER ONLY)
ALT: 17 U/L (ref 0–44)
AST: 11 U/L — ABNORMAL LOW (ref 15–41)
Albumin: 3.4 g/dL — ABNORMAL LOW (ref 3.5–5.0)
Alkaline Phosphatase: 67 U/L (ref 38–126)
Anion gap: 2 — ABNORMAL LOW (ref 5–15)
BUN: 14 mg/dL (ref 8–23)
CO2: 30 mmol/L (ref 22–32)
Calcium: 10.1 mg/dL (ref 8.9–10.3)
Chloride: 108 mmol/L (ref 98–111)
Creatinine: 1.02 mg/dL — ABNORMAL HIGH (ref 0.44–1.00)
GFR, Estimated: >60 mL/min (ref >60)
Glucose, Bld: 293 mg/dL — ABNORMAL HIGH (ref 70–99)
Potassium: 3.8 mmol/L (ref 3.5–5.1)
Sodium: 140 mmol/L (ref 135–145)
Total Bilirubin: 0.5 mg/dL (ref 0.3–1.2)
Total Protein: 6.9 g/dL (ref 6.5–8.1)

## 2020-09-01 LAB — CBC WITH DIFFERENTIAL (CANCER CENTER ONLY)
Abs Immature Granulocytes: 0.04 10*3/uL (ref 0.00–0.07)
Basophils Absolute: 0 10*3/uL (ref 0.0–0.1)
Basophils Relative: 1 %
Eosinophils Absolute: 0.2 10*3/uL (ref 0.0–0.5)
Eosinophils Relative: 2 %
HCT: 32.3 % — ABNORMAL LOW (ref 36.0–46.0)
Hemoglobin: 10.3 g/dL — ABNORMAL LOW (ref 12.0–15.0)
Immature Granulocytes: 1 %
Lymphocytes Relative: 28 %
Lymphs Abs: 2.5 10*3/uL (ref 0.7–4.0)
MCH: 28.3 pg (ref 26.0–34.0)
MCHC: 31.9 g/dL (ref 30.0–36.0)
MCV: 88.7 fL (ref 80.0–100.0)
Monocytes Absolute: 0.5 10*3/uL (ref 0.1–1.0)
Monocytes Relative: 6 %
Neutro Abs: 5.6 10*3/uL (ref 1.7–7.7)
Neutrophils Relative %: 62 %
Platelet Count: 277 10*3/uL (ref 150–400)
RBC: 3.64 MIL/uL — ABNORMAL LOW (ref 3.87–5.11)
RDW: 13.6 % (ref 11.5–15.5)
WBC Count: 8.9 10*3/uL (ref 4.0–10.5)
nRBC: 0 % (ref 0.0–0.2)

## 2020-09-01 MED ORDER — SODIUM CHLORIDE 0.9% FLUSH
10.0000 mL | Freq: Once | INTRAVENOUS | Status: AC | PRN
  Administered 2020-09-01: 10 mL
  Filled 2020-09-01: qty 10

## 2020-09-01 MED ORDER — DIPHENOXYLATE-ATROPINE 2.5-0.025 MG PO TABS
1.0000 | ORAL_TABLET | Freq: Four times a day (QID) | ORAL | 2 refills | Status: DC | PRN
Start: 2020-09-01 — End: 2020-11-04

## 2020-09-01 MED ORDER — ALBUTEROL SULFATE HFA 108 (90 BASE) MCG/ACT IN AERS
1.0000 | INHALATION_SPRAY | RESPIRATORY_TRACT | 0 refills | Status: AC | PRN
Start: 2020-09-01 — End: ?

## 2020-09-01 NOTE — Progress Notes (Signed)
Pt requests refill for albuterol and lomotil. Forwarded to Cira Rue, NP

## 2020-09-02 ENCOUNTER — Ambulatory Visit: Payer: Medicare HMO | Admitting: Internal Medicine

## 2020-09-03 ENCOUNTER — Encounter: Payer: Self-pay | Admitting: *Deleted

## 2020-09-03 NOTE — Progress Notes (Signed)
Pharmacist Chemotherapy Monitoring - Initial Assessment    Anticipated start date: 09/04/20  Regimen:  . Are orders appropriate based on the patient's diagnosis, regimen, and cycle? Yes . Does the plan date match the patient's scheduled date? Yes . Is the sequencing of drugs appropriate? Yes . Are the premedications appropriate for the patient's regimen? Yes . Prior Authorization for treatment is: Approved o If applicable, is the correct biosimilar selected based on the patient's insurance? not applicable  Organ Function and Labs: Marland Kitchen Are dose adjustments needed based on the patient's renal function, hepatic function, or hematologic function? No . Are appropriate labs ordered prior to the start of patient's treatment? Yes . Other organ system assessment, if indicated: ado-trastuzumab: Echo/ MUGA . The following baseline labs, if indicated, have been ordered: N/A  Dose Assessment: . Are the drug doses appropriate? Yes . Are the following correct: o Drug concentrations Yes o IV fluid compatible with drug Yes o Administration routes Yes o Timing of therapy Yes . If applicable, does the patient have documented access for treatment and/or plans for port-a-cath placement? yes . If applicable, have lifetime cumulative doses been properly documented and assessed? not applicable Lifetime Dose Tracking  No doses have been documented on this patient for the following tracked chemicals: Doxorubicin, Epirubicin, Idarubicin, Daunorubicin, Mitoxantrone, Bleomycin, Oxaliplatin, Carboplatin, Liposomal Doxorubicin  o   Toxicity Monitoring/Prevention: . The patient has the following take home antiemetics prescribed: Ondansetron, Prochlorperazine and Dexamethasone . The patient has the following take home medications prescribed: N/A . Medication allergies and previous infusion related reactions, if applicable, have been reviewed and addressed. Yes . The patient's current medication list has been  assessed for drug-drug interactions with their chemotherapy regimen. no significant drug-drug interactions were identified on review.  Order Review: . Are the treatment plan orders signed? Yes . Is the patient scheduled to see a provider prior to their treatment? No  I verify that I have reviewed each item in the above checklist and answered each question accordingly.  Romualdo Bolk [facility] 09/03/2020 2:23 PM

## 2020-09-03 NOTE — Progress Notes (Signed)
Cardiology Office Note:   Date:  09/07/2020  NAME:  Cheryl Blankenship    MRN: [id number] DOB:  09/28/1950   PCP:  [name], PA-C  Cardiologist:  No primary care provider on file.   Referring MD: [name], MD   Chief Complaint  Patient presents with  . Congestive Heart Failure   History of Present Illness:   Cheryl Blankenship is a 69 y.o. female with a hx of diastolic heart failure, obesity, diabetes, hypertension who presents for follow-up. Recently diagnosed with stage II breast cancer. Will undergo surveillance echocardiography. Admitted for diastolic heart failure in setting of volume overload after receiving intravenous fluids with chemotherapy.  She reports that she has had increasing lower extremity edema and shortness of breath for the past few days.  Her blood pressure is 160/90.  She is taking Lasix 40 mg a day.  She reports she was unaware of restricting her salt intake.  She has not done any of this.  Her weight is 222 pounds.  This is slightly up from 217 pounds when she left the hospital.  She denies any chest pain.  Her diabetes is still uncontrolled.  She reports she is working on this.  She reports chemotherapy has been tough on her body.  We did discuss switching to torsemide.  We also discussed a more aggressive blood pressure regimen.  We also discussed extensively salt reductive strategies.  She is going to have to get better at this.  Problem List 1. DM -A1c 9.8 -Normal stress test, normal echo 08/2019 ([facility]) 2. HTN 3. HLD -T chol 154, TG 139, HDL 60, LDL 66 4. Breast CA Stage II, triple positive  5. HFpEF -EF 65-70%  Past Medical History: Past Medical History:  Diagnosis Date  . Anxiety   . Breast cancer ([place])   . CHF (congestive heart failure) ([place])   . Diabetes mellitus without complication ([place])    type 2  . Hypertension     Past Surgical History: Past Surgical History:  Procedure Laterality Date  . APPENDECTOMY    .  CHOLECYSTECTOMY    . COLONOSCOPY    . EYE SURGERY     cataracts  . MULTIPLE TOOTH EXTRACTIONS    . PORTACATH PLACEMENT N/A 08/04/2020   Procedure: INSERTION PORT-A-CATH WITH ULTRASOUND GUIDANCE;  Surgeon: [name], MD;  Location: [facility];  Service: General;  Laterality: N/A;  . STOMACH SURGERY      Current Medications: Current Meds  Medication Sig  . Accu-Chek FastClix Lancets MISC   . acetaminophen (TYLENOL) 500 MG tablet Take 500 mg by mouth every 4 (four) hours as needed for mild pain.  Marland Kitchen albuterol (VENTOLIN HFA) 108 (90 Base) MCG/ACT inhaler Inhale 1-2 puffs into the lungs every 4 (four) hours as needed for wheezing or shortness of breath.  Marland Kitchen aspirin 81 MG chewable tablet Chew 81 mg by mouth daily.   . Cholecalciferol 25 MCG (1000 UT) capsule Take 1,000 Units by mouth daily.  . diphenoxylate-atropine (LOMOTIL) 2.5-0.025 MG tablet Take 1-2 tablets by mouth 4 (four) times daily as needed for diarrhea or loose stools.  . DULoxetine (CYMBALTA) 30 MG capsule Take 30 mg by mouth daily.  . fluorometholone (FML) 0.1 % ophthalmic suspension Place 1 drop into both eyes daily.  . folic acid (FOLVITE) 1 MG tablet Take 1 tablet by mouth daily.  Marland Kitchen gabapentin (NEURONTIN) 400 MG capsule Take 400 mg by mouth 3 (three) times daily.   Marland Kitchen glipiZIDE (GLUCOTROL XL)  10 MG 24 hr tablet Take 10 mg by mouth daily with breakfast.   . HYDROcodone-acetaminophen (NORCO/VICODIN) 5-325 MG tablet Take 1 tablet by mouth every 6 (six) hours as needed for moderate pain.  Marland Kitchen insulin aspart (NOVOLOG) 100 UNIT/ML injection Inject 2-10 Units into the skin 3 (three) times daily before meals. Per sliding scale:  150-199 = 2 units, 200-249 = 4 units, 250-299 = 6 units, 300-349 = 8 units, greater than350 = 10 units.  . insulin glargine (LANTUS SOLOSTAR) 100 UNIT/ML Solostar Pen Inject 35 Units into the skin 2 (two) times daily. (Patient taking differently: Inject 35 Units into the skin 2 (two) times daily. Pt takes 70 units in  the morning and 70 units at night.)  . losartan (COZAAR) 100 MG tablet Take 100 mg by mouth daily.  . metFORMIN (GLUCOPHAGE) 500 MG tablet Take 500 mg by mouth 2 (two) times daily with a meal.   . Nutritional Supplements (FEEDING SUPPLEMENT, GLUCERNA 1.2 CAL,) LIQD Take 237 mLs by mouth 3 (three) times daily with meals.  . Omega-3 Fatty Acids (FISH OIL) 1000 MG CAPS Take 1,000 mg by mouth daily.  . ondansetron (ZOFRAN-ODT) 4 MG disintegrating tablet Take 4 mg by mouth 2 (two) times daily as needed for nausea or vomiting.   Marland Kitchen oxyCODONE (OXY IR/ROXICODONE) 5 MG immediate release tablet Take 1-2 tablets (5-10 mg total) by mouth every 8 (eight) hours as needed for severe pain.  . potassium chloride (KLOR-CON) 10 MEQ tablet Take 20 mEq by mouth daily. DAILY WITH LASIX  . RESTASIS 0.05 % ophthalmic emulsion Place 1 drop into both eyes 2 (two) times daily.  . rosuvastatin (CRESTOR) 5 MG tablet Take 5 mg by mouth at bedtime.  Marland Kitchen tiZANidine (ZANAFLEX) 4 MG tablet Take 4 mg by mouth 2 (two) times daily as needed for muscle spasms.   . vitamin E 180 MG (400 UNITS) capsule Take 400 Units by mouth daily.  . [DISCONTINUED] furosemide (LASIX) 40 MG tablet Take 40 mg by mouth daily.  . [DISCONTINUED] metoprolol tartrate (LOPRESSOR) 50 MG tablet Take 50 mg by mouth in the morning and at bedtime.     Allergies:    Coconut (cocos nucifera) allergy skin test, Hydrochlorothiazide, Lisinopril, and Coconut oil   Social History: Social History   Socioeconomic History  . Marital status: Married    Spouse name: Not on file  . Number of children: 2  . Years of education: Not on file  . Highest education level: Not on file  Occupational History  . Occupation: retired Human resources officer  Tobacco Use  . Smoking status: Former Smoker    Packs/day: 3.00    Years: 30.00    Pack years: 90.00  . Smokeless tobacco: Never Used  Vaping Use  . Vaping Use: Never used  Substance and Sexual Activity  . 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 Drug use: Never  . Sexual activity: Not on file  Other Topics Concern  . Not on file  Social History Narrative  . Not on file   Social Determinants of Health   Financial Resource Strain: Not on file  Food Insecurity: Not on file  Transportation Needs: Not on file  Physical Activity: Not on file  Stress: Not on file  Social Connections: Not on file     Family History: The patient's family history includes Cancer in her cousin, cousin, and niece; Diabetes in her sister; Heart attack  in her brother, mother, and sister; Ovarian cancer (age of onset: 16) in her mother.  ROS:   All other ROS reviewed and negative. Pertinent positives noted in the HPI.     EKGs/Labs/Other Studies Reviewed:   The following studies were personally reviewed by me today:  EKG:  EKG is ordered today.  The ekg ordered today demonstrates normal sinus rhythm, heart rate 77, LVH, and was personally reviewed by me.   TTE 07/31/2020 1. Left ventricular ejection fraction, by estimation, is 65 to 70%. The  left ventricle has normal function. The left ventricle has no regional  wall motion abnormalities. Left ventricular diastolic parameters are  indeterminate. The average left  ventricular global longitudinal strain is -17.1 %.  2. Right ventricular systolic function is normal. The right ventricular  size is normal. There is normal pulmonary artery systolic pressure.  3. The mitral valve is normal in structure. Trivial mitral valve  regurgitation. No evidence of mitral stenosis.  4. The aortic valve is normal in structure. Aortic valve regurgitation is  not visualized. No aortic stenosis is present.   Recent Labs: 08/25/2020: B Natriuretic Peptide 45.4 09/01/2020: ALT 17; BUN 14; Creatinine 1.02; Hemoglobin 10.3; Platelet Count 277; Potassium 3.8; Sodium 140   Recent Lipid Panel No results found for: CHOL, TRIG, HDL, CHOLHDL, VLDL, LDLCALC,  LDLDIRECT  Physical Exam:   VS:  BP (!) 160/90 (BP Location: Right Arm, Patient Position: Sitting)   Pulse 77   Ht 5\' 3"  (1.6 m)   Wt 222 lb (100.7 kg)   SpO2 97%   BMI 39.33 kg/m    Wt Readings from Last 3 Encounters:  09/07/20 222 lb (100.7 kg)  09/01/20 226 lb 1.6 oz (102.6 kg)  08/27/20 218 lb 8 oz (99.1 kg)    General: Well nourished, well developed, in no acute distress Head: Atraumatic, normal size  Eyes: PEERLA, EOMI  Neck: Supple, 7 to 8 cm of water JVD Endocrine: No thryomegaly Cardiac: Normal S1, S2; RRR; no murmurs, rubs, or gallops Lungs: Clear to auscultation bilaterally, no wheezing, rhonchi or rales  Abd: Soft, nontender, no hepatomegaly  Ext: 2+ pitting edema up to the mid shins Musculoskeletal: No deformities, BUE and BLE strength normal and equal Skin: Warm and dry, no rashes   Neuro: Alert and oriented to person, place, time, and situation, CNII-XII grossly intact, no focal deficits  Psych: Normal mood and affect   ASSESSMENT:   Cheryl Blankenship is a 69 y.o. female who presents for the following: 1. Chronic diastolic heart failure ([place])   2. Mixed hyperlipidemia   3. Primary hypertension   4. Obesity, morbid, BMI 40.0-49.9 ([place])     PLAN:   1. Chronic diastolic heart failure ([place]) -Recent admission for diastolic heart failure.  Volume up.  She was unaware that she needs to restrict her salt.  She was given information about salt reductive strategies. -We will switch her to 40 mg of torsemide daily.  She will continue with 20 mEq of potassium to be taken with her torsemide. -I have also added Aldactone for blood pressure control.  This will help with fluid as well.  2. Mixed hyperlipidemia -Continue Crestor 5 mg a day.  Most recent LDL 66.  Goal is less than 70 given her diabetes.  3. Primary hypertension -Blood pressure not controlled.  Stop metoprolol.  We will start Coreg 25 twice a day.  Continue amlodipine 10 mg a day.  Continue losartan 100 mg  a day.  Add  Aldactone 25 mg a day.  She will work on reducing her salt.  4. Obesity, morbid, BMI 40.0-49.9 ([place]) -Weight loss encouraged.  Disposition: Return in about 1 month (around 10/08/2020).  Medication Adjustments/Labs and Tests Ordered: Current medicines are reviewed at length with the patient today.  Concerns regarding medicines are outlined above.  Orders Placed This Encounter  Procedures  . EKG 12-Lead   Meds ordered this encounter  Medications  . torsemide (DEMADEX) 20 MG tablet    Sig: Take 2 tablets (40 mg total) by mouth daily.    Dispense:  180 tablet    Refill:  3  . DISCONTD: carvedilol (COREG) 25 MG tablet    Sig: Take 1 tablet (25 mg total) by mouth daily.    Dispense:  180 tablet    Refill:  3  . spironolactone (ALDACTONE) 25 MG tablet    Sig: Take 1 tablet (25 mg total) by mouth daily.    Dispense:  90 tablet    Refill:  1  . carvedilol (COREG) 25 MG tablet    Sig: Take 1 tablet (25 mg total) by mouth 2 (two) times daily with a meal.    Dispense:  180 tablet    Refill:  3    Patient Instructions  Medication Instructions:  Stop Lasix Start Torsemide 40 mg daily  Continue Potassium tablet with Torsemide  Stop Metoprolol Start Coreg 25 mg twice daily  Start Aldactone 25 mg daily   *If you need a refill on your cardiac medications before your next appointment, please call your pharmacy*   Follow-Up: At [facility], you and your health needs are our priority.  As part of our continuing mission to provide you with exceptional heart care, we have created designated Provider Care Teams.  These Care Teams include your primary Cardiologist (physician) and Advanced Practice Providers (APPs -  Physician Assistants and Nurse Practitioners) who all work together to provide you with the care you need, when you need it.  We recommend signing up for the patient portal called "MyChart".  Sign up information is provided on this After Visit Summary.  MyChart is  used to connect with patients for Virtual Visits (Telemedicine).  Patients are able to view lab/test results, encounter notes, upcoming appointments, etc.  Non-urgent messages can be sent to your provider as well.   To learn more about what you can do with MyChart, go to [email or web address].    Your next appointment:   4 week(s)  The format for your next appointment:   In Person  Provider:   Eleonore Chiquito, MD        Time Spent with Patient: I have spent a total of 35 minutes with patient reviewing hospital notes, telemetry, EKGs, labs and examining the patient as well as establishing an assessment and plan that was discussed with the patient.  > 50% of time was spent in direct patient care.  Signed, Addison Naegeli. Audie Box, [place]  [address] [place], [place] [address] [phone]  09/07/2020 4:49 PM

## 2020-09-04 ENCOUNTER — Inpatient Hospital Stay: Payer: Medicare HMO

## 2020-09-04 ENCOUNTER — Other Ambulatory Visit: Payer: Self-pay

## 2020-09-04 ENCOUNTER — Telehealth: Payer: Self-pay | Admitting: Nurse Practitioner

## 2020-09-04 ENCOUNTER — Inpatient Hospital Stay (HOSPITAL_BASED_OUTPATIENT_CLINIC_OR_DEPARTMENT_OTHER): Payer: Medicare HMO | Admitting: Hematology

## 2020-09-04 ENCOUNTER — Encounter: Payer: Self-pay | Admitting: Hematology

## 2020-09-04 VITALS — BP 157/67 | HR 81 | Temp 98.4°F | Resp 18

## 2020-09-04 DIAGNOSIS — C50412 Malignant neoplasm of upper-outer quadrant of left female breast: Secondary | ICD-10-CM | POA: Diagnosis not present

## 2020-09-04 DIAGNOSIS — Z170 Estrogen receptor positive status [ER+]: Secondary | ICD-10-CM

## 2020-09-04 DIAGNOSIS — Z5112 Encounter for antineoplastic immunotherapy: Secondary | ICD-10-CM | POA: Diagnosis not present

## 2020-09-04 MED ORDER — HEPARIN SOD (PORK) LOCK FLUSH 100 UNIT/ML IV SOLN
500.0000 [IU] | Freq: Once | INTRAVENOUS | Status: AC | PRN
  Administered 2020-09-04: 500 [IU]
  Filled 2020-09-04: qty 5

## 2020-09-04 MED ORDER — ACETAMINOPHEN 325 MG PO TABS
ORAL_TABLET | ORAL | Status: AC
  Filled 2020-09-04: qty 2

## 2020-09-04 MED ORDER — FUROSEMIDE 10 MG/ML IJ SOLN: 20.0000 mg | Freq: Once | INTRAMUSCULAR | Status: DC

## 2020-09-04 MED ORDER — ACETAMINOPHEN 325 MG PO TABS
650.0000 mg | ORAL_TABLET | Freq: Once | ORAL | Status: AC
  Administered 2020-09-04: 650 mg via ORAL

## 2020-09-04 MED ORDER — FUROSEMIDE 10 MG/ML IJ SOLN
20.0000 mg | Freq: Once | INTRAMUSCULAR | Status: AC
  Administered 2020-09-04: 20 mg via INTRAVENOUS

## 2020-09-04 MED ORDER — SODIUM CHLORIDE 0.9 % IV SOLN
3.6000 mg/kg | Freq: Once | INTRAVENOUS | Status: AC
  Administered 2020-09-04: 360 mg via INTRAVENOUS
  Filled 2020-09-04: qty 10

## 2020-09-04 MED ORDER — SODIUM CHLORIDE 0.9% FLUSH
10.0000 mL | INTRAVENOUS | Status: DC | PRN
  Administered 2020-09-04: 10 mL
  Filled 2020-09-04: qty 10

## 2020-09-04 MED ORDER — DIPHENHYDRAMINE HCL 25 MG PO CAPS
50.0000 mg | ORAL_CAPSULE | Freq: Once | ORAL | Status: AC
  Administered 2020-09-04: 50 mg via ORAL

## 2020-09-04 MED ORDER — DIPHENHYDRAMINE HCL 25 MG PO CAPS
ORAL_CAPSULE | ORAL | Status: AC
  Filled 2020-09-04: qty 2

## 2020-09-04 MED ORDER — FUROSEMIDE 10 MG/ML IJ SOLN
INTRAMUSCULAR | Status: AC
  Filled 2020-09-04: qty 2

## 2020-09-04 MED ORDER — SODIUM CHLORIDE 0.9 % IV SOLN
Freq: Once | INTRAVENOUS | Status: AC
  Filled 2020-09-04: qty 250

## 2020-09-04 NOTE — Progress Notes (Signed)
Per Dr. Burr Medico, okay to proceed with treatment today with elevated blood pressure. Patient has yet to take blood pressure medication this morning but has brought medication with her. She will take and I will re-check her blood pressure later today.

## 2020-09-04 NOTE — Patient Instructions (Signed)
Ado-Trastuzumab Emtansine for injection What is this medicine? ADO-TRASTUZUMAB EMTANSINE (ADD oh traz TOO zuh mab em TAN zine) is a monoclonal antibody combined with chemotherapy. It is used to treat breast cancer. This medicine may be used for other purposes; ask your health care provider or pharmacist if you have questions. COMMON BRAND NAME(S): Kadcyla What should I tell my health care provider before I take this medicine? They need to know if you have any of these conditions:  heart disease  heart failure  infection (especially a virus infection such as chickenpox, cold sores, or herpes)  liver disease  lung or breathing disease, like asthma  tingling of the fingers or toes, or other nerve disorder  an unusual or allergic reaction to ado-trastuzumab emtansine, other medications, foods, dyes, or preservatives  pregnant or trying to get pregnant  breast-feeding How should I use this medicine? This medicine is for infusion into a vein. It is given by a health care professional in a hospital or clinic setting. Talk to your pediatrician regarding the use of this medicine in children. Special care may be needed. Overdosage: If you think you have taken too much of this medicine contact a poison control center or emergency room at once. NOTE: This medicine is only for you. Do not share this medicine with others. What if I miss a dose? It is important not to miss your dose. Call your doctor or health care professional if you are unable to keep an appointment. What may interact with this medicine? This medicine may also interact with the following medications:  atazanavir  boceprevir  clarithromycin  delavirdine  indinavir  dalfopristin; quinupristin  isoniazid, INH  itraconazole  ketoconazole  nefazodone  nelfinavir  ritonavir  telaprevir  telithromycin  tipranavir  voriconazole This list may not describe all possible interactions. Give your health care  provider a list of all the medicines, herbs, non-prescription drugs, or dietary supplements you use. Also tell them if you smoke, drink alcohol, or use illegal drugs. Some items may interact with your medicine. What should I watch for while using this medicine? Visit your doctor for checks on your progress. This drug may make you feel generally unwell. This is not uncommon, as chemotherapy can affect healthy cells as well as cancer cells. Report any side effects. Continue your course of treatment even though you feel ill unless your doctor tells you to stop. You may need blood work done while you are taking this medicine. Call your doctor or health care professional for advice if you get a fever, chills or sore throat, or other symptoms of a cold or flu. Do not treat yourself. This drug decreases your body's ability to fight infections. Try to avoid being around people who are sick. Be careful brushing and flossing your teeth or using a toothpick because you may get an infection or bleed more easily. If you have any dental work done, tell your dentist you are receiving this medicine. Avoid taking products that contain aspirin, acetaminophen, ibuprofen, naproxen, or ketoprofen unless instructed by your doctor. These medicines may hide a fever. Do not become pregnant while taking this medicine or for 7 months after stopping it, men with female partners should use contraception during treatment and for 4 months after the last dose. Women should inform their doctor if they wish to become pregnant or think they might be pregnant. There is a potential for serious side effects to an unborn child. Do not breast-feed an infant while taking this medicine or   for 7 months after the last dose. Men who have a partner who is pregnant or who is capable of becoming pregnant should use a condom during sexual activity while taking this medicine and for 4 months after stopping it. Men should inform their doctors if they wish to  father a child. This medicine may lower sperm counts. Talk to your health care professional or pharmacist for more information. What side effects may I notice from receiving this medicine? Side effects that you should report to your doctor or health care professional as soon as possible:  allergic reactions like skin rash, itching or hives, swelling of the face, lips, or tongue  breathing problems  chest pain or palpitations  fever or chills, sore throat  general ill feeling or flu-like symptoms  light-colored stools  nausea, vomiting  pain, tingling, numbness in the hands or feet  signs and symptoms of bleeding such as bloody or black, tarry stools; red or dark-brown urine; spitting up blood or brown material that looks like coffee grounds; red spots on the skin; unusual bruising or bleeding from the eye, gums, or nose  swelling of the legs or ankles  yellowing of the eyes or skin Side effects that usually do not require medical attention (report to your doctor or health care professional if they continue or are bothersome):  changes in taste  constipation  dizziness  headache  joint pain  muscle pain  trouble sleeping  unusually weak or tired This list may not describe all possible side effects. Call your doctor for medical advice about side effects. You may report side effects to FDA at 1-800-FDA-1088. Where should I keep my medicine? This drug is given in a hospital or clinic and will not be stored at home. NOTE: This sheet is a summary. It may not cover all possible information. If you have questions about this medicine, talk to your doctor, pharmacist, or health care provider.  2020 Elsevier/Gold Standard (2018-02-02 10:03:15)  

## 2020-09-04 NOTE — Progress Notes (Signed)
[place]   [phone] [phone]) [phone]   Clinic Follow up Note   Patient Care Team: [name], PA-C as PCP - General (Internal Medicine) [name], RN as Oncology Nurse Navigator [name], RN as Oncology Nurse Navigator [name], MD as Consulting Physician (General Surgery) [name], MD as Consulting Physician (Hematology) [name], MD as Consulting Physician (Radiation Oncology) [name], [name], MD as Consulting Physician (Cardiology) 09/04/2020  CHIEF COMPLAINT: feet edema   SUMMARY OF ONCOLOGIC HISTORY: Oncology History Overview Note  Cancer Staging Malignant neoplasm of upper-outer quadrant of left breast in female, estrogen receptor positive ([place]) Staging form: Breast, AJCC 8th Edition - Clinical stage from 07/16/2020: Stage IB (cT2, cN1, cM0, G2, ER+, PR+, HER2+) - Signed by [name], MD on 07/21/2020    Malignant neoplasm of upper-outer quadrant of left breast in female, estrogen receptor positive ([place])  07/01/2020 Mammogram   IMPRESSION: 1. There is a 12 cm span of highly suspicious calcifications involving the upper outer and upper inner quadrants of the left breast. On ultrasound, within this area of calcifications, there are confluent masses at 1 o'clock spanning at least 4.5 cm. An additional suspicious mass is seen in the left breast at 2 o'clock.  -Ultrasound targeted to the left breast at 1 o'clock, 5 cm from the nipple demonstrates a broad area of hypoechoic irregular tissue/confluent masses spanning at least 4.5 cm. There is a separate oval hypoechoic mass with indistinct margins at 2 o'clock, 7 cm from the nipple measuring 0.9 x 0.4 x 0.9 cm. Ultrasound of the left axilla demonstrates 2 abnormally thickened lymph nodes with cortices measuring 6-7 mm. There is an additional lymph node with a borderline cortex of 4 mm.   2. There are 2 lymph nodes with thickened cortices in the  left axilla, and an additional lymph node with a borderline thickened cortex.   07/16/2020 Cancer Staging   Staging form: Breast, AJCC 8th Edition - Clinical stage from 07/16/2020: Stage IIA (cT3, cN1, cM0, G2, ER+, PR+, HER2+) - Signed by [name], MD on 08/05/2020   07/16/2020 Initial Biopsy   Diagnosis 1. 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 Breast, left, needle core biopsy, 1 o'clock - INVASIVE DUCTAL CARCINOMA - SEE COMMENT 3. Lymph node, biopsy - METASTATIC CARCINOMA INVOLVING A LYMPH NODE - SEE COMMENT Microscopic Comment 1. Based on the biopsy, the ductal carcinoma in situ has a comedo pattern, high nuclear grade and measures 0.7 cm in greatest linear extent. Prognostic markers (ER/PR) are pending and will be reported in an addendum. 2. Based on the biopsy, the carcinoma appears Nottingham grade 2 of 3 and measures 1.5 cm in greatest linear extent. Prognostic markers (ER/PR/ki-67/HER2) are pending and will be reported in an addendum. Dr. Saralyn Pilar reviewed the case and agrees with the above diagnosis. These results were called to [facility] on July 17, 2020. 3. Based on the biopsy, the carcinoma appears measures 1.0 cm in greatest linear extent. Prognostic markers (ER/PR/ki-67/HER2) are pending and will be reported in an addendum.   07/16/2020 Receptors her2   1. PROGNOSTIC INDICATORS Results: IMMUNOHISTOCHEMICAL AND MORPHOMETRIC ANALYSIS PERFORMED MANUALLY Estrogen Receptor: 30%, POSITIVE, STRONG STAINING INTENSITY Progesterone Receptor: 5%, POSITIVE, STRONG STAINING INTENSITY  2. PROGNOSTIC INDICATORS Results: IMMUNOHISTOCHEMICAL AND MORPHOMETRIC ANALYSIS PERFORMED MANUALLY The tumor cells are POSITIVE for Her2 (3+). Estrogen Receptor: 85%, POSITIVE, STRONG  STAINING INTENSITY Progesterone Receptor: 60%, POSITIVE, STRONG STAINING  INTENSITY Proliferation Marker Ki67: 25%  3. PROGNOSTIC INDICATORS Results: IMMUNOHISTOCHEMICAL AND MORPHOMETRIC ANALYSIS PERFORMED MANUALLY The tumor cells are POSITIVE for Her2 (3+). Estrogen Receptor: 95%, POSITIVE, STRONG STAINING INTENSITY Progesterone Receptor: 45%, POSITIVE, STRONG STAINING INTENSITY Proliferation Marker Ki67: 30%   07/20/2020 Initial Diagnosis   Malignant neoplasm of upper-outer quadrant of left breast in female, estrogen receptor positive ([place])   07/31/2020 Echocardiogram   Baseline Echo  IMPRESSIONS     1. Left ventricular ejection fraction, by estimation, is 65 to 70%. The  left ventricle has normal function. The left ventricle has no regional  wall motion abnormalities. Left ventricular diastolic parameters are  indeterminate. The average left  ventricular global longitudinal strain is -17.1 %.   2. Right ventricular systolic function is normal. The right ventricular  size is normal. There is normal pulmonary artery systolic pressure.   3. The mitral valve is normal in structure. Trivial mitral valve  regurgitation. No evidence of mitral stenosis.   4. The aortic valve is normal in structure. Aortic valve regurgitation is  not visualized. No aortic stenosis is present.    07/31/2020 Breast MRI   IMPRESSION: 1. Extensive non mass enhancement within the left breast extending from the nipple to the chest wall. Overall findings are most compatible with extensive malignancy involving the majority of the left breast. 2. Indeterminate enhancing mass within the upper inner right breast. 3. Three morphologically abnormal lymph nodes within the left axilla. One of these nodes has been biopsied compatible with metastatic adenopathy.     08/03/2020 Imaging   Bone Scan whole body  IMPRESSION: Increased radiotracer uptake in the mid to lower lumbar regions of uncertain etiology. Correlation with radiography to assess for potential arthropathy in these  areas advised. Increased uptake in major joints is likely of arthropathic etiology. Increased uptake in the mid face is likely due to paranasal sinus disease. Distribution of radiotracer uptake in bony structures elsewhere unremarkable.   08/04/2020 Procedure   PAC placement by Dr [name]   08/05/2020 -  Neo-Adjuvant Anti-estrogen oral therapy   Neoadjuvant THP q3weeks for 4-6 cycles starting 08/05/20, followed by Herceptin or Kadcyla q3weeks to complete 1 year of treatment.        -Due to very poor toleration after C1 THP, I switched her to Kadcyla and Perjeta q3weeks starting 08/25/20.   08/07/2020 Imaging   CT CAP  IMPRESSION: 2.7 cm mass in the upper outer left breast, likely corresponding to the patient's known primary breast neoplasm.   8 mm short axis left axillary node, suspicious for nodal metastasis in this clinical context.   No findings suspicious for distant metastasis.   Endometrium is mildly prominent, measuring 11 mm. Correlate for vaginal bleeding and consider pelvic ultrasound for further evaluation if clinically warranted.   08/11/2020 Pathology Results   Diagnosis Breast, right, needle core biopsy, right breast - PAPILLARY LESION - SEE COMMENT Microscopic Comment The biopsy consists of a papillary lesion with a focal area of adenosis. Immunohistochemistry (SMM, calponin and p63) shows retention of the myoepithelial cell layer in the focus of adenosis. 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 These results were called to [facility] on August 12, 2020.   09/04/2020 -  Chemotherapy   The patient had ado-trastuzumab emtansine (KADCYLA) 360 mg in sodium chloride 0.9 % 250 mL chemo infusion, 3.6 mg/kg = 360 mg, Intravenous, Once, 1 of 5 cycles  for chemotherapy  treatment.      CURRENT THERAPY: neoadjuvant Kadcyla starting today   INTERVAL HISTORY: Netasha came in for her first dose of Kadcyla today.  She was recently  hospitalized for CHF exacerbation.  She was seen by NP Lacie 3 days ago.  I was called to see her in the infusion room this morning due to the worsening bilateral ankle and feet swelling.  She states it started last night, she has been compliant with lasix 31m daily, and elevating her legs at home, she has not used compression stocks.  She denies salty food and she has been drinking fluids only about 30oz daily, no fever, chills, or leg pain lately   All other systems were reviewed with the patient and are negative.  MEDICAL HISTORY:  Past Medical History:  Diagnosis Date  . Anxiety   . Breast cancer ([facility]   . CHF (congestive heart failure) ([facility]   . Diabetes mellitus without complication ([facility]    type 2  . Hypertension     SURGICAL HISTORY: Past Surgical History:  Procedure Laterality Date  . APPENDECTOMY    . CHOLECYSTECTOMY    . COLONOSCOPY    . EYE SURGERY     cataracts  . MULTIPLE TOOTH EXTRACTIONS    . PORTACATH PLACEMENT N/A 08/04/2020   Procedure: INSERTION PORT-A-CATH WITH ULTRASOUND GUIDANCE;  Surgeon: TDonnie Mesa MD;  [facility]  Service: General;  Laterality: N/A;  . STOMACH SURGERY      I have reviewed the social history and family history with the patient and they are unchanged from previous note.  ALLERGIES:  is allergic to hydrochlorothiazide, lisinopril, and coconut oil.  MEDICATIONS:  Current Outpatient Medications  Medication Sig Dispense Refill  . Accu-Chek FastClix Lancets MISC     . acetaminophen (TYLENOL) 500 MG tablet Take 500 mg by mouth every 4 (four) hours as needed for mild pain.    .Marland Kitchenalbuterol (VENTOLIN HFA) 108 (90 Base) MCG/ACT inhaler Inhale 1-2 puffs into the lungs every 4 (four) hours as needed for wheezing or shortness of breath. 8 g 0  . amLODipine (NORVASC) 10 MG tablet Take 10 mg by mouth daily.    .Marland Kitchenaspirin 81 MG chewable tablet Chew 81 mg by mouth daily.     . Cholecalciferol 25 MCG (1000 UT) capsule Take 1,000 Units by mouth  daily.    . diphenoxylate-atropine (LOMOTIL) 2.5-0.025 MG tablet Take 1-2 tablets by mouth 4 (four) times daily as needed for diarrhea or loose stools. 60 tablet 2  . DULoxetine (CYMBALTA) 30 MG capsule Take 30 mg by mouth daily.    . fluorometholone (FML) 0.1 % ophthalmic suspension Place 1 drop into both eyes daily.    . folic acid (FOLVITE) 1 MG tablet Take 1 tablet by mouth daily.    . furosemide (LASIX) 40 MG tablet Take 40 mg by mouth daily.    .Marland Kitchengabapentin (NEURONTIN) 400 MG capsule Take 400 mg by mouth 3 (three) times daily.     .Marland KitchenglipiZIDE (GLUCOTROL XL) 10 MG 24 hr tablet Take 10 mg by mouth daily with breakfast.     . HYDROcodone-acetaminophen (NORCO/VICODIN) 5-325 MG tablet Take 1 tablet by mouth every 6 (six) hours as needed for moderate pain. 15 tablet 0  . insulin aspart (NOVOLOG) 100 UNIT/ML injection Inject 2-10 Units into the skin 3 (three) times daily before meals. Per sliding scale:  150-199 = 2 units, 200-249 = 4 units, 250-299 = 6 units, 300-349 = 8 units, greater  [id number] = 10 units.    . insulin glargine (LANTUS SOLOSTAR) 100 UNIT/ML Solostar Pen Inject 35 Units into the skin 2 (two) times daily. 15 mL 11  . losartan (COZAAR) 100 MG tablet Take 100 mg by mouth daily.    . metFORMIN (GLUCOPHAGE) 500 MG tablet Take 500 mg by mouth 2 (two) times daily with a meal.     . metoprolol tartrate (LOPRESSOR) 50 MG tablet Take 50 mg by mouth in the morning and at bedtime.    . Nutritional Supplements (FEEDING SUPPLEMENT, GLUCERNA 1.2 CAL,) LIQD Take 237 mLs by mouth 3 (three) times daily with meals.    . Omega-3 Fatty Acids (FISH OIL) 1000 MG CAPS Take 1,000 mg by mouth daily.    . ondansetron (ZOFRAN-ODT) 4 MG disintegrating tablet Take 4 mg by mouth 2 (two) times daily as needed for nausea or vomiting.     Marland Kitchen oxyCODONE (OXY IR/ROXICODONE) 5 MG immediate release tablet Take 1-2 tablets (5-10 mg total) by mouth every 8 (eight) hours as needed for severe pain. 15 tablet 0  . potassium  chloride (KLOR-CON) 10 MEQ tablet Take 20 mEq by mouth daily. DAILY WITH LASIX    . RESTASIS 0.05 % ophthalmic emulsion Place 1 drop into both eyes 2 (two) times daily.    . rosuvastatin (CRESTOR) 5 MG tablet Take 5 mg by mouth at bedtime.    Marland Kitchen tiZANidine (ZANAFLEX) 4 MG tablet Take 4 mg by mouth 2 (two) times daily as needed for muscle spasms.     . vitamin E 180 MG (400 UNITS) capsule Take 400 Units by mouth daily.     No current facility-administered medications for this visit.   Facility-Administered Medications Ordered in Other Visits  Medication Dose Route Frequency Provider Last Rate Last Admin  . ado-trastuzumab emtansine (KADCYLA) 360 mg in sodium chloride 0.9 % 250 mL chemo infusion  3.6 mg/kg (Treatment Plan Recorded) Intravenous Once [name], MD 179 mL/hr at 09/04/20 1014 360 mg at 09/04/20 1014    PHYSICAL EXAMINATION: ECOG PERFORMANCE STATUS: 2 - Symptomatic, <50% confined to bed Blood pressure 179/84, heart rate 72, respirate 18, temperature 36.8, O2 96% on RA GENERAL:alert, no distress and comfortable SKIN: skin color, texture, turgor are normal, no rashes or significant lesions LUNG: Normal breathing sound, no rales or wheezing Musculoskeletal:no cyanosis of digits and no clubbing, (+) bilateral mild pitting edema from feet to mid calf, symmetric  NEURO: alert & oriented x 3 with fluent speech, no focal motor/sensory deficits  LABORATORY DATA:  I have reviewed the data as listed CBC Latest Ref Rng & Units 09/01/2020 08/27/2020 08/26/2020  WBC 4.0 - 10.5 K/uL 8.9 9.8 9.2  Hemoglobin 12.0 - 15.0 g/dL 10.3(L) 11.7(L) 11.4(L)  Hematocrit 36.0 - 46.0 % 32.3(L) 37.5 35.5(L)  Platelets 150 - 400 K/uL 277 341 335     CMP Latest Ref Rng & Units 09/01/2020 08/27/2020 08/26/2020  Glucose 70 - 99 mg/dL 293(H) 242(H) 154(H)  BUN 8 - 23 mg/dL 14 27(H) 14  Creatinine 0.44 - 1.00 mg/dL 1.02(H) 1.24(H) 0.73  Sodium 135 - 145 mmol/L 140 137 139  Potassium 3.5 - 5.1 mmol/L 3.8 4.5  3.3(L)  Chloride 98 - 111 mmol/L 108 100 101  CO2 22 - 32 mmol/L 30 29 26   Calcium 8.9 - 10.3 mg/dL 10.1 10.1 10.0  Total Protein 6.5 - 8.1 g/dL 6.9 - 7.2  Total Bilirubin 0.3 - 1.2 mg/dL 0.5 - 0.6  Alkaline Phos 38 - 126 U/L 67 -  63  AST 15 - 41 U/L 11(L) - 14(L)  ALT 0 - 44 U/L 17 - 16      RADIOGRAPHIC STUDIES: I have personally reviewed the radiological images as listed and agreed with the findings in the report. No results found.   ASSESSMENT & PLAN:  No problem-specific Assessment & Plan notes found for this encounter.   1. Worsening leg edema, recent CHF exacerbation -she is currently on oral furosemide 40 mg daily, she is compliant -The leg edema is likely related to her CHF, will give iv lasix 3m once today  -continue furosemide 40 mg daily, okay to take extra pill if she has worsening leg edema -She is scheduled to see cardiology later next week  2. Malignant neoplasm of upper-outer quadrant of left breast, StageII, c(T2N1M0), ER+/PR+/HER2+, GradeII -We will proceed with Kadcyla today -Follow-up in 1-2 weeks with NP Lacie   3.  Uncontrolled hypertension and diabetes -Follow-up with cardiologist and PCP  No orders of the defined types were placed in this encounter.  All questions were answered. The patient knows to call the clinic with any problems, questions or concerns. No barriers to learning was detected. I spent 15 minutes counseling the patient face to face. The total time spent in the appointment was 20 minutes and more than 50% was on counseling and review of test results     YTruitt Merle MD 09/04/20

## 2020-09-04 NOTE — Telephone Encounter (Signed)
Scheduled appointments per 12/14 los. Per secure chat with RN Lonn Georgia, updated calendar will be printed for patient.

## 2020-09-07 ENCOUNTER — Encounter: Payer: Self-pay | Admitting: Cardiovascular Disease

## 2020-09-07 ENCOUNTER — Ambulatory Visit (INDEPENDENT_AMBULATORY_CARE_PROVIDER_SITE_OTHER): Payer: Medicare HMO | Admitting: Cardiovascular Disease

## 2020-09-07 ENCOUNTER — Other Ambulatory Visit: Payer: Self-pay

## 2020-09-07 VITALS — BP 160/90 | HR 77 | Ht 63.0 in | Wt 222.0 lb

## 2020-09-07 DIAGNOSIS — I5032 Chronic diastolic (congestive) heart failure: Secondary | ICD-10-CM | POA: Diagnosis not present

## 2020-09-07 DIAGNOSIS — I10 Essential (primary) hypertension: Secondary | ICD-10-CM

## 2020-09-07 DIAGNOSIS — E782 Mixed hyperlipidemia: Secondary | ICD-10-CM

## 2020-09-07 MED ORDER — TORSEMIDE 20 MG PO TABS: 40.0000 mg | ORAL_TABLET | Freq: Every day | ORAL | 3 refills | Status: DC

## 2020-09-07 MED ORDER — CARVEDILOL 25 MG PO TABS: 25.0000 mg | ORAL_TABLET | Freq: Every day | ORAL | 3 refills | Status: DC

## 2020-09-07 MED ORDER — SPIRONOLACTONE 25 MG PO TABS: 25.0000 mg | ORAL_TABLET | Freq: Every day | ORAL | 1 refills | Status: DC

## 2020-09-07 MED ORDER — CARVEDILOL 25 MG PO TABS: 25.0000 mg | ORAL_TABLET | Freq: Two times a day (BID) | ORAL | 3 refills | Status: DC

## 2020-09-07 NOTE — Patient Instructions (Addendum)
Medication Instructions:  Stop Lasix Start Torsemide 40 mg daily  Continue Potassium tablet with Torsemide  Stop Metoprolol Start Coreg 25 mg twice daily  Start Aldactone 25 mg daily   *If you need a refill on your cardiac medications before your next appointment, please call your pharmacy*   Follow-Up: At [facility], you and your health needs are our priority.  As part of our continuing mission to provide you with exceptional heart care, we have created designated Provider Care Teams.  These Care Teams include your primary Cardiologist (physician) and Advanced Practice Providers (APPs -  Physician Assistants and Nurse Practitioners) who all work together to provide you with the care you need, when you need it.  We recommend signing up for the patient portal called "MyChart".  Sign up information is provided on this After Visit Summary.  MyChart is used to connect with patients for Virtual Visits (Telemedicine).  Patients are able to view lab/test results, encounter notes, upcoming appointments, etc.  Non-urgent messages can be sent to your provider as well.   To learn more about what you can do with MyChart, go to [email or web address].    Your next appointment:   4 week(s)  The format for your next appointment:   In Person  Provider:   Eleonore Chiquito, MD

## 2020-09-13 NOTE — Progress Notes (Deleted)
[place]   [phone] [phone]) [phone]   Clinic Follow up Note   Patient Care Team: [name], PA-C as PCP - General (Internal Medicine) [name], RN as Oncology Nurse Navigator [name], RN as Oncology Nurse Navigator [name], MD as Consulting Physician (General Surgery) [name], MD as Consulting Physician (Hematology) [name], MD as Consulting Physician (Radiation Oncology) [name], [name], MD as Consulting Physician (Cardiology) 09/13/2020  CHIEF COMPLAINT: Follow up left breast cancer   SUMMARY OF ONCOLOGIC HISTORY: Oncology History Overview Note  Cancer Staging Malignant neoplasm of upper-outer quadrant of left breast in female, estrogen receptor positive ([place]) Staging form: Breast, AJCC 8th Edition - Clinical stage from 07/16/2020: Stage IB (cT2, cN1, cM0, G2, ER+, PR+, HER2+) - Signed by [name], MD on 07/21/2020    Malignant neoplasm of upper-outer quadrant of left breast in female, estrogen receptor positive ([place])  07/01/2020 Mammogram   IMPRESSION: 1. There is a 12 cm span of highly suspicious calcifications involving the upper outer and upper inner quadrants of the left breast. On ultrasound, within this area of calcifications, there are confluent masses at 1 o'clock spanning at least 4.5 cm. An additional suspicious mass is seen in the left breast at 2 o'clock.  -Ultrasound targeted to the left breast at 1 o'clock, 5 cm from the nipple demonstrates a broad area of hypoechoic irregular tissue/confluent masses spanning at least 4.5 cm. There is a separate oval hypoechoic mass with indistinct margins at 2 o'clock, 7 cm from the nipple measuring 0.9 x 0.4 x 0.9 cm. Ultrasound of the left axilla demonstrates 2 abnormally thickened lymph nodes with cortices measuring 6-7 mm. There is an additional lymph node with a borderline cortex of 4 mm.   2. There are 2 lymph nodes with thickened  cortices in the left axilla, and an additional lymph node with a borderline thickened cortex.   07/16/2020 Cancer Staging   Staging form: Breast, AJCC 8th Edition - Clinical stage from 07/16/2020: Stage IIA (cT3, cN1, cM0, G2, ER+, PR+, HER2+) - Signed by [name], MD on 08/05/2020   07/16/2020 Initial Biopsy   Diagnosis 1. 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 Breast, left, needle core biopsy, 1 o'clock - INVASIVE DUCTAL CARCINOMA - SEE COMMENT 3. Lymph node, biopsy - METASTATIC CARCINOMA INVOLVING A LYMPH NODE - SEE COMMENT Microscopic Comment 1. Based on the biopsy, the ductal carcinoma in situ has a comedo pattern, high nuclear grade and measures 0.7 cm in greatest linear extent. Prognostic markers (ER/PR) are pending and will be reported in an addendum. 2. Based on the biopsy, the carcinoma appears Nottingham grade 2 of 3 and measures 1.5 cm in greatest linear extent. Prognostic markers (ER/PR/ki-67/HER2) are pending and will be reported in an addendum. Dr. Saralyn Pilar reviewed the case and agrees with the above diagnosis. These results were called to [facility] on July 17, 2020. 3. Based on the biopsy, the carcinoma appears measures 1.0 cm in greatest linear extent. Prognostic markers (ER/PR/ki-67/HER2) are pending and will be reported in an addendum.   07/16/2020 Receptors her2   1. PROGNOSTIC INDICATORS Results: IMMUNOHISTOCHEMICAL AND MORPHOMETRIC ANALYSIS PERFORMED MANUALLY Estrogen Receptor: 30%, POSITIVE, STRONG STAINING INTENSITY Progesterone Receptor: 5%, POSITIVE, STRONG STAINING INTENSITY  2. PROGNOSTIC INDICATORS Results: IMMUNOHISTOCHEMICAL AND MORPHOMETRIC ANALYSIS PERFORMED MANUALLY The tumor cells are POSITIVE for Her2 (3+). Estrogen Receptor:  85%, POSITIVE, STRONG STAINING INTENSITY Progesterone Receptor: 60%, POSITIVE,  STRONG STAINING INTENSITY Proliferation Marker Ki67: 25%  3. PROGNOSTIC INDICATORS Results: IMMUNOHISTOCHEMICAL AND MORPHOMETRIC ANALYSIS PERFORMED MANUALLY The tumor cells are POSITIVE for Her2 (3+). Estrogen Receptor: 95%, POSITIVE, STRONG STAINING INTENSITY Progesterone Receptor: 45%, POSITIVE, STRONG STAINING INTENSITY Proliferation Marker Ki67: 30%   07/20/2020 Initial Diagnosis   Malignant neoplasm of upper-outer quadrant of left breast in female, estrogen receptor positive ([place])   07/31/2020 Echocardiogram   Baseline Echo  IMPRESSIONS     1. Left ventricular ejection fraction, by estimation, is 65 to 70%. The  left ventricle has normal function. The left ventricle has no regional  wall motion abnormalities. Left ventricular diastolic parameters are  indeterminate. The average left  ventricular global longitudinal strain is -17.1 %.   2. Right ventricular systolic function is normal. The right ventricular  size is normal. There is normal pulmonary artery systolic pressure.   3. The mitral valve is normal in structure. Trivial mitral valve  regurgitation. No evidence of mitral stenosis.   4. The aortic valve is normal in structure. Aortic valve regurgitation is  not visualized. No aortic stenosis is present.    07/31/2020 Breast MRI   IMPRESSION: 1. Extensive non mass enhancement within the left breast extending from the nipple to the chest wall. Overall findings are most compatible with extensive malignancy involving the majority of the left breast. 2. Indeterminate enhancing mass within the upper inner right breast. 3. Three morphologically abnormal lymph nodes within the left axilla. One of these nodes has been biopsied compatible with metastatic adenopathy.     08/03/2020 Imaging   Bone Scan whole body  IMPRESSION: Increased radiotracer uptake in the mid to lower lumbar regions of uncertain etiology. Correlation with radiography to assess for potential  arthropathy in these areas advised. Increased uptake in major joints is likely of arthropathic etiology. Increased uptake in the mid face is likely due to paranasal sinus disease. Distribution of radiotracer uptake in bony structures elsewhere unremarkable.   08/04/2020 Procedure   PAC placement by Dr [name]   08/05/2020 -  Neo-Adjuvant Anti-estrogen oral therapy   Neoadjuvant THP q3weeks for 4-6 cycles starting 08/05/20, followed by Herceptin or Kadcyla q3weeks to complete 1 year of treatment.        -Due to very poor toleration after C1 THP, I switched her to Kadcyla and Perjeta q3weeks starting 08/25/20.   08/07/2020 Imaging   CT CAP  IMPRESSION: 2.7 cm mass in the upper outer left breast, likely corresponding to the patient's known primary breast neoplasm.   8 mm short axis left axillary node, suspicious for nodal metastasis in this clinical context.   No findings suspicious for distant metastasis.   Endometrium is mildly prominent, measuring 11 mm. Correlate for vaginal bleeding and consider pelvic ultrasound for further evaluation if clinically warranted.   08/11/2020 Pathology Results   Diagnosis Breast, right, needle core biopsy, right breast - PAPILLARY LESION - SEE COMMENT Microscopic Comment The biopsy consists of a papillary lesion with a focal area of adenosis. Immunohistochemistry (SMM, calponin and p63) shows retention of the myoepithelial cell layer in the focus of adenosis. 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 These results were called to [facility] on August 12, 2020.   09/04/2020 -  Chemotherapy   The patient had ado-trastuzumab emtansine (KADCYLA) 360 mg in sodium chloride 0.9 % 250 mL chemo infusion, 3.6 mg/kg = 360 mg, Intravenous, Once, 1 of 5 cycles  Administration: 360 mg (09/04/2020)  for chemotherapy treatment.      CURRENT THERAPY: Neoadjuvant docetaxel/herceptin/perjeta q3 weeks starting 11/17,  tolerated poorly and changed to perjeta and kadcyla to start 09/04/20. perjeta denied by insurance   INTERVAL HISTORY: Ms. Manus returns for close f/up and toxicity check as scheduled. She received C1 kadcyla on 09/04/20. She had leg edema and received extra 20 mg IV lasix in addition to her routine 40 mg po lasix. She was seen by cardiology last week who switched lasix to torsemide, stopped metoprolol, started coreg and aldactone. She continues potassium **   REVIEW OF SYSTEMS:   Constitutional: Denies fevers, chills or abnormal weight loss Eyes: Denies blurriness of vision Ears, nose, mouth, throat, and face: Denies mucositis or sore throat Respiratory: Denies cough, dyspnea or wheezes Cardiovascular: Denies palpitation, chest discomfort or lower extremity swelling Gastrointestinal:  Denies nausea, heartburn or change in bowel habits Skin: Denies abnormal skin rashes Lymphatics: Denies new lymphadenopathy or easy bruising Neurological:Denies numbness, tingling or new weaknesses Behavioral/Psych: Mood is stable, no new changes  All other systems were reviewed with the patient and are negative.  MEDICAL HISTORY:  Past Medical History:  Diagnosis Date  . Anxiety   . Breast cancer ([place])   . CHF (congestive heart failure) ([place])   . Diabetes mellitus without complication ([place])    type 2  . Hypertension     SURGICAL HISTORY: Past Surgical History:  Procedure Laterality Date  . APPENDECTOMY    . CHOLECYSTECTOMY    . COLONOSCOPY    . EYE SURGERY     cataracts  . MULTIPLE TOOTH EXTRACTIONS    . PORTACATH PLACEMENT N/A 08/04/2020   Procedure: INSERTION PORT-A-CATH WITH ULTRASOUND GUIDANCE;  Surgeon: [name], MD;  Location: [place];  Service: General;  Laterality: N/A;  . STOMACH SURGERY      I have reviewed the social history and family history with the patient and they are unchanged from previous note.  ALLERGIES:  is allergic to coconut (cocos nucifera) allergy skin test,  hydrochlorothiazide, lisinopril, and coconut oil.  MEDICATIONS:  Current Outpatient Medications  Medication Sig Dispense Refill  . Accu-Chek FastClix Lancets MISC     . acetaminophen (TYLENOL) 500 MG tablet Take 500 mg by mouth every 4 (four) hours as needed for mild pain.    Marland Kitchen albuterol (VENTOLIN HFA) 108 (90 Base) MCG/ACT inhaler Inhale 1-2 puffs into the lungs every 4 (four) hours as needed for wheezing or shortness of breath. 8 g 0  . amLODipine (NORVASC) 10 MG tablet Take 10 mg by mouth daily.    Marland Kitchen aspirin 81 MG chewable tablet Chew 81 mg by mouth daily.     . carvedilol (COREG) 25 MG tablet Take 1 tablet (25 mg total) by mouth 2 (two) times daily with a meal. 180 tablet 3  . Cholecalciferol 25 MCG (1000 UT) capsule Take 1,000 Units by mouth daily.    . diphenoxylate-atropine (LOMOTIL) 2.5-0.025 MG tablet Take 1-2 tablets by mouth 4 (four) times daily as needed for diarrhea or loose stools. 60 tablet 2  . DULoxetine (CYMBALTA) 30 MG capsule Take 30 mg by mouth daily.    . fluorometholone (FML) 0.1 % ophthalmic suspension Place 1 drop into both eyes daily.    . folic acid (FOLVITE) 1 MG tablet Take 1 tablet by mouth daily.    Marland Kitchen gabapentin (NEURONTIN) 400 MG capsule Take 400 mg by mouth 3 (three) times daily.     Marland Kitchen glipiZIDE (GLUCOTROL XL) 10  MG 24 hr tablet Take 10 mg by mouth daily with breakfast.     . HYDROcodone-acetaminophen (NORCO/VICODIN) 5-325 MG tablet Take 1 tablet by mouth every 6 (six) hours as needed for moderate pain. 15 tablet 0  . insulin aspart (NOVOLOG) 100 UNIT/ML injection Inject 2-10 Units into the skin 3 (three) times daily before meals. Per sliding scale:  150-199 = 2 units, 200-249 = 4 units, 250-299 = 6 units, 300-349 = 8 units, greater than350 = 10 units.    . insulin glargine (LANTUS SOLOSTAR) 100 UNIT/ML Solostar Pen Inject 35 Units into the skin 2 (two) times daily. (Patient taking differently: Inject 35 Units into the skin 2 (two) times daily. Pt takes 70 units  in the morning and 70 units at night.) 15 mL 11  . losartan (COZAAR) 100 MG tablet Take 100 mg by mouth daily.    . metFORMIN (GLUCOPHAGE) 500 MG tablet Take 500 mg by mouth 2 (two) times daily with a meal.     . Nutritional Supplements (FEEDING SUPPLEMENT, GLUCERNA 1.2 CAL,) LIQD Take 237 mLs by mouth 3 (three) times daily with meals.    . Omega-3 Fatty Acids (FISH OIL) 1000 MG CAPS Take 1,000 mg by mouth daily.    . ondansetron (ZOFRAN-ODT) 4 MG disintegrating tablet Take 4 mg by mouth 2 (two) times daily as needed for nausea or vomiting.     Marland Kitchen oxyCODONE (OXY IR/ROXICODONE) 5 MG immediate release tablet Take 1-2 tablets (5-10 mg total) by mouth every 8 (eight) hours as needed for severe pain. 15 tablet 0  . potassium chloride (KLOR-CON) 10 MEQ tablet Take 20 mEq by mouth daily. DAILY WITH LASIX    . RESTASIS 0.05 % ophthalmic emulsion Place 1 drop into both eyes 2 (two) times daily.    . rosuvastatin (CRESTOR) 5 MG tablet Take 5 mg by mouth at bedtime.    Marland Kitchen spironolactone (ALDACTONE) 25 MG tablet Take 1 tablet (25 mg total) by mouth daily. 90 tablet 1  . tiZANidine (ZANAFLEX) 4 MG tablet Take 4 mg by mouth 2 (two) times daily as needed for muscle spasms.     Marland Kitchen torsemide (DEMADEX) 20 MG tablet Take 2 tablets (40 mg total) by mouth daily. 180 tablet 3  . vitamin E 180 MG (400 UNITS) capsule Take 400 Units by mouth daily.     No current facility-administered medications for this visit.    PHYSICAL EXAMINATION: ECOG PERFORMANCE STATUS: {CHL ONC ECOG PS:[phone]}  There were no vitals filed for this visit. There were no vitals filed for this visit.  GENERAL:alert, no distress and comfortable SKIN: skin color, texture, turgor are normal, no rashes or significant lesions EYES: normal, Conjunctiva are pink and non-injected, sclera clear OROPHARYNX:no exudate, no erythema and lips, buccal mucosa, and tongue normal  NECK: supple, thyroid normal size, non-tender, without nodularity LYMPH:  no  palpable lymphadenopathy in the cervical, axillary or inguinal LUNGS: clear to auscultation and percussion with normal breathing effort HEART: regular rate & rhythm and no murmurs and no lower extremity edema ABDOMEN:abdomen soft, non-tender and normal bowel sounds Musculoskeletal:no cyanosis of digits and no clubbing  NEURO: alert & oriented x 3 with fluent speech, no focal motor/sensory deficits  LABORATORY DATA:  I have reviewed the data as listed CBC Latest Ref Rng & Units 09/01/2020 08/27/2020 08/26/2020  WBC 4.0 - 10.5 K/uL 8.9 9.8 9.2  Hemoglobin 12.0 - 15.0 g/dL 10.3(L) 11.7(L) 11.4(L)  Hematocrit 36.0 - 46.0 % 32.3(L) 37.5 35.5(L)  Platelets 150 -  400 K/uL 277 341 335     CMP Latest Ref Rng & Units 09/01/2020 08/27/2020 08/26/2020  Glucose 70 - 99 mg/dL 293(H) 242(H) 154(H)  BUN 8 - 23 mg/dL 14 27(H) 14  Creatinine 0.44 - 1.00 mg/dL 1.02(H) 1.24(H) 0.73  Sodium 135 - 145 mmol/L 140 137 139  Potassium 3.5 - 5.1 mmol/L 3.8 4.5 3.3(L)  Chloride 98 - 111 mmol/L 108 100 101  CO2 22 - 32 mmol/L 30 29 26   Calcium 8.9 - 10.3 mg/dL 10.1 10.1 10.0  Total Protein 6.5 - 8.1 g/dL 6.9 - 7.2  Total Bilirubin 0.3 - 1.2 mg/dL 0.5 - 0.6  Alkaline Phos 38 - 126 U/L 67 - 63  AST 15 - 41 U/L 11(L) - 14(L)  ALT 0 - 44 U/L 17 - 16      RADIOGRAPHIC STUDIES: I have personally reviewed the radiological images as listed and agreed with the findings in the report. No results found.   ASSESSMENT & PLAN:  No problem-specific Assessment & Plan notes found for this encounter.   No orders of the defined types were placed in this encounter.  All questions were answered. The patient knows to call the clinic with any problems, questions or concerns. No barriers to learning was detected. I spent {CHL ONC TIME VISIT - [facility]SHU:[id number]} counseling the patient face to face. The total time spent in the appointment was {CHL ONC TIME VISIT - BZMCE:[id number]} and more than 50% was on counseling and review  of test results     Alla Feeling, NP 09/13/20

## 2020-09-14 ENCOUNTER — Other Ambulatory Visit: Payer: Self-pay | Admitting: Hematology

## 2020-09-14 ENCOUNTER — Other Ambulatory Visit: Payer: Medicare HMO

## 2020-09-14 ENCOUNTER — Ambulatory Visit: Payer: Medicare HMO | Admitting: Nurse Practitioner

## 2020-09-14 DIAGNOSIS — C50412 Malignant neoplasm of upper-outer quadrant of left female breast: Secondary | ICD-10-CM

## 2020-09-15 ENCOUNTER — Telehealth: Payer: Self-pay

## 2020-09-15 NOTE — Telephone Encounter (Signed)
I spoke with Cheryl Blankenship daughter Cheryl Blankenship.  Cheryl Blankenship was not feeling well yesterday, her legs hurt. She did not want to come to her appt.  Cheryl Blankenship thought her Mom called and canceled the appt. Other than bilateral leg pain  Cheryl Blankenship is doing ok.  She has no dyspnea, she is eating and drinking.  I encouraged her to keep her 1/4 appointment. She verbalized understanding.

## 2020-09-17 ENCOUNTER — Telehealth: Payer: Self-pay

## 2020-09-17 NOTE — Telephone Encounter (Signed)
MS Jarosz's daughter called stating she and her mother have been exposed to covid.  Ms Brundage is being tested on Tuesday 09/22/2020.  Scheduling message sent to reschedule appts for later in the week.  No other infusion appts available next week.  Rescheduled for 1/10.  Appts for next cycle adjusted.I spoke with Driscilla Grammes and she verbalized understanding

## 2020-09-22 ENCOUNTER — Ambulatory Visit: Payer: Medicare HMO | Admitting: Hematology

## 2020-09-22 ENCOUNTER — Other Ambulatory Visit: Payer: Medicare HMO

## 2020-09-22 ENCOUNTER — Ambulatory Visit: Payer: Medicare HMO

## 2020-09-24 ENCOUNTER — Telehealth: Payer: Self-pay

## 2020-09-24 NOTE — Telephone Encounter (Signed)
I received a call from Lunette Stands Ms Streight's daughter.  Ms Azucena has tested positive for covid.  She is asymptomatic at this time.  Information forwarded to Dr. Mosetta Putt.

## 2020-09-28 ENCOUNTER — Ambulatory Visit: Payer: Medicare HMO

## 2020-09-28 ENCOUNTER — Other Ambulatory Visit: Payer: Medicare HMO

## 2020-09-28 ENCOUNTER — Ambulatory Visit: Payer: Medicare HMO | Admitting: Hematology

## 2020-10-05 ENCOUNTER — Telehealth: Payer: Medicare HMO | Admitting: Cardiovascular Disease

## 2020-10-06 ENCOUNTER — Other Ambulatory Visit: Payer: Medicare HMO

## 2020-10-06 ENCOUNTER — Encounter: Payer: Self-pay | Admitting: *Deleted

## 2020-10-06 ENCOUNTER — Ambulatory Visit: Payer: Medicare HMO | Admitting: Nurse Practitioner

## 2020-10-06 ENCOUNTER — Ambulatory Visit: Payer: Medicare HMO

## 2020-10-08 NOTE — Progress Notes (Addendum)
Cardiology Office Note:   Date:  10/09/2020  NAME:  Cheryl Blankenship    MRN: [id number] DOB:  Feb 12, 1951   PCP:  [name], PA-C  Cardiologist:  No primary care provider on file.   Referring MD: Dayton Scrape*   Chief Complaint  Patient presents with  . Follow-up    Follow-up Shortness of Breath, swelling in ankles   History of Present Illness:   Cheryl Blankenship is a 70 y.o. female with a hx of HFpEF, HTN, DM, obesity, breast CA who presents for follow-up. Switched to torsemide at last visit. Aldactone added for BP control.  Her weight is stable at 220 pounds.  She was doing well on Lasix.  Blood pressure 130/69 on Aldactone.  She reports her shortness of breath is much improved.  She is still on chemotherapy.  She needs repeat echocardiogram in February.  Symptoms of heart failure have improved considerably on medication.  Blood pressures well controlled as well.  She is working on her salt intake.  We did discuss taking Jardiance.  I think this would be indicated to help with her volume.  She is okay to do this.  She also reports she completed a sleep study and was told she needed a heart monitor.  She reports she will forward [place] the results of that sleep test.  She does have acid reflux and request a refill on Prilosec.  She denies any chest pain, shortness of breath or palpitations in office today.  She is working on her diabetes.  Problem List 1. DM -A1c 9.8 -Normal stress test, normal echo 08/2019([facility]) 2. HTN 3. HLD -T chol 154, TG 139, HDL 60, LDL 66 4. Breast CA Stage II, ER+, PR+, HER2+ -currently on neo-adjuvant antiestrogen oral therapy  5. HFpEF -EF 65-70%  Past Medical History: Past Medical History:  Diagnosis Date  . Anxiety   . Breast cancer ([place])   . CHF (congestive heart failure) ([place])   . Diabetes mellitus without complication ([place])    type 2  . Hypertension     Past Surgical History: Past Surgical History:  Procedure  Laterality Date  . APPENDECTOMY    . CHOLECYSTECTOMY    . COLONOSCOPY    . EYE SURGERY     cataracts  . MULTIPLE TOOTH EXTRACTIONS    . PORTACATH PLACEMENT N/A 08/04/2020   Procedure: INSERTION PORT-A-CATH WITH ULTRASOUND GUIDANCE;  Surgeon: [name], MD;  Location: [facility];  Service: General;  Laterality: N/A;  . STOMACH SURGERY      Current Medications: Current Meds  Medication Sig  . Accu-Chek FastClix Lancets MISC   . acetaminophen (TYLENOL) 500 MG tablet Take 500 mg by mouth every 4 (four) hours as needed for mild pain.  Marland Kitchen albuterol (VENTOLIN HFA) 108 (90 Base) MCG/ACT inhaler Inhale 1-2 puffs into the lungs every 4 (four) hours as needed for wheezing or shortness of breath.  Marland Kitchen aspirin 81 MG chewable tablet Chew 81 mg by mouth daily.   . Cholecalciferol 25 MCG (1000 UT) capsule Take 1,000 Units by mouth daily.  . diphenoxylate-atropine (LOMOTIL) 2.5-0.025 MG tablet Take 1-2 tablets by mouth 4 (four) times daily as needed for diarrhea or loose stools.  . DULoxetine (CYMBALTA) 30 MG capsule Take 30 mg by mouth daily.  . empagliflozin (JARDIANCE) 10 MG TABS tablet Take 1 tablet (10 mg total) by mouth daily before breakfast.  . fluorometholone (FML) 0.1 % ophthalmic suspension Place 1 drop into both eyes daily.  Marland Kitchen  folic acid (FOLVITE) 1 MG tablet Take 1 tablet by mouth daily.  Marland Kitchen gabapentin (NEURONTIN) 400 MG capsule Take 400 mg by mouth 3 (three) times daily.   Marland Kitchen glipiZIDE (GLUCOTROL XL) 10 MG 24 hr tablet Take 10 mg by mouth daily with breakfast.   . HYDROcodone-acetaminophen (NORCO/VICODIN) 5-325 MG tablet Take 1 tablet by mouth every 6 (six) hours as needed for moderate pain.  Marland Kitchen insulin aspart (NOVOLOG) 100 UNIT/ML injection Inject 2-10 Units into the skin 3 (three) times daily before meals. Per sliding scale:  150-199 = 2 units, 200-249 = 4 units, 250-299 = 6 units, 300-349 = 8 units, greater than350 = 10 units.  . insulin glargine (LANTUS SOLOSTAR) 100 UNIT/ML Solostar Pen  Inject 35 Units into the skin 2 (two) times daily. (Patient taking differently: Inject 35 Units into the skin 2 (two) times daily. Pt takes 70 units in the morning and 70 units at night.)  . lidocaine-prilocaine (EMLA) cream APPLY TO AFFECTED AREA 1 time DAILY  . metFORMIN (GLUCOPHAGE) 500 MG tablet Take 500 mg by mouth 2 (two) times daily with a meal.   . Nutritional Supplements (FEEDING SUPPLEMENT, GLUCERNA 1.2 CAL,) LIQD Take 237 mLs by mouth 3 (three) times daily with meals.  . Omega-3 Fatty Acids (FISH OIL) 1000 MG CAPS Take 1,000 mg by mouth daily.  Marland Kitchen omeprazole (PRILOSEC) 20 MG capsule Take 1 capsule (20 mg total) by mouth daily.  . ondansetron (ZOFRAN-ODT) 4 MG disintegrating tablet Take 4 mg by mouth 2 (two) times daily as needed for nausea or vomiting.   Marland Kitchen oxyCODONE (OXY IR/ROXICODONE) 5 MG immediate release tablet Take 1-2 tablets (5-10 mg total) by mouth every 8 (eight) hours as needed for severe pain.  . potassium chloride (KLOR-CON) 10 MEQ tablet Take 20 mEq by mouth daily. DAILY WITH LASIX  . RESTASIS 0.05 % ophthalmic emulsion Place 1 drop into both eyes 2 (two) times daily.  . rosuvastatin (CRESTOR) 5 MG tablet Take 5 mg by mouth at bedtime.  Marland Kitchen tiZANidine (ZANAFLEX) 4 MG tablet Take 4 mg by mouth 2 (two) times daily as needed for muscle spasms.   . vitamin E 180 MG (400 UNITS) capsule Take 400 Units by mouth daily.  . [DISCONTINUED] carvedilol (COREG) 25 MG tablet Take 1 tablet (25 mg total) by mouth 2 (two) times daily with a meal.  . [DISCONTINUED] losartan (COZAAR) 100 MG tablet Take 100 mg by mouth daily.  . [DISCONTINUED] spironolactone (ALDACTONE) 25 MG tablet Take 1 tablet (25 mg total) by mouth daily.  . [DISCONTINUED] torsemide (DEMADEX) 20 MG tablet Take 2 tablets (40 mg total) by mouth daily.     Allergies:    Coconut (cocos nucifera) allergy skin test, Hydrochlorothiazide, Lisinopril, and Coconut oil   Social History: Social History   Socioeconomic History  .  Marital status: Married    Spouse name: Not on file  . Number of children: 2  . Years of education: Not on file  . Highest education level: Not on file  Occupational History  . Occupation: retired Human resources officer  Tobacco Use  . Smoking status: Former Smoker    Packs/day: 3.00    Years: 30.00    Pack years: 90.00  . Smokeless tobacco: Never Used  Vaping Use  . Vaping Use: Never used  Substance and Sexual Activity  . 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 Drug use: Never  . Sexual activity: Not on  file  Other Topics Concern  . Not on file  Social History Narrative  . Not on file   Social Determinants of Health   Financial Resource Strain: Not on file  Food Insecurity: Not on file  Transportation Needs: Not on file  Physical Activity: Not on file  Stress: Not on file  Social Connections: Not on file     Family History: The patient's family history includes Cancer in her cousin, cousin, and niece; Diabetes in her sister; Heart attack in her brother, mother, and sister; Ovarian cancer (age of onset: 5) in her mother.  ROS:   All other ROS reviewed and negative. Pertinent positives noted in the HPI.     EKGs/Labs/Other Studies Reviewed:   The following studies were personally reviewed by me today:  TTE 07/31/2020 1. Left ventricular ejection fraction, by estimation, is 65 to 70%. The  left ventricle has normal function. The left ventricle has no regional  wall motion abnormalities. Left ventricular diastolic parameters are  indeterminate. The average left  ventricular global longitudinal strain is -17.1 %.  2. Right ventricular systolic function is normal. The right ventricular  size is normal. There is normal pulmonary artery systolic pressure.  3. The mitral valve is normal in structure. Trivial mitral valve  regurgitation. No evidence of mitral stenosis.  4. The aortic valve is normal in structure. Aortic valve  regurgitation is  not visualized. No aortic stenosis is present.   Recent Labs: 08/25/2020: B Natriuretic Peptide 45.4 09/01/2020: ALT 17; BUN 14; Creatinine 1.02; Hemoglobin 10.3; Platelet Count 277; Potassium 3.8; Sodium 140   Recent Lipid Panel No results found for: CHOL, TRIG, HDL, CHOLHDL, VLDL, LDLCALC, LDLDIRECT  Physical Exam:   VS:  BP 130/69 (BP Location: Left Arm, Patient Position: Sitting)   Pulse 68   Ht $R'5\' 3"'WP$  (1.6 m)   Wt 222 lb (100.7 kg)   SpO2 98%   BMI 39.33 kg/m    Wt Readings from Last 3 Encounters:  10/09/20 222 lb (100.7 kg)  09/07/20 222 lb (100.7 kg)  09/01/20 226 lb 1.6 oz (102.6 kg)    General: Well nourished, well developed, in no acute distress Head: Atraumatic, normal size  Eyes: PEERLA, EOMI  Neck: Supple, no JVD Endocrine: No thryomegaly Cardiac: Normal S1, S2; RRR; no murmurs, rubs, or gallops Lungs: Clear to auscultation bilaterally, no wheezing, rhonchi or rales  Abd: Soft, nontender, no hepatomegaly  Ext: Trace edema Musculoskeletal: No deformities, BUE and BLE strength normal and equal Skin: Warm and dry, no rashes   Neuro: Alert and oriented to person, place, time, and situation, CNII-XII grossly intact, no focal deficits  Psych: Normal mood and affect   ASSESSMENT:   Cheryl Blankenship is a 70 y.o. female who presents for the following: 1. Chronic diastolic heart failure ([place])   2. Primary hypertension   3. Mixed hyperlipidemia   4. Obesity, morbid, BMI 40.0-49.9 ([place])   5. Gastroesophageal reflux disease without esophagitis   6. Chemotherapy follow-up examination     PLAN:   1. Chronic diastolic heart failure ([facility]) -Euvolemic on exam.  Much improved with blood pressure control and torsemide.  I think this is all related to uncontrolled diabetes and hypertension and obesity.  I do not think this is related to her chemotherapeutic agents. -She will continue Norvasc 10 mg a day, carvedilol 25 mg twice a day, losartan 100 mg a day,  Aldactone 25 mg a day.  Continue torsemide 40 mg a day.  She is  to watch her salt intake. -Add Jardiance 10 mg a day.  2. Primary hypertension -BP well controlled on above medications.  3. Mixed hyperlipidemia -Most recent LDL 66.  Continue Crestor.  4. Obesity, morbid, BMI 40.0-49.9 ([place]) -Diet and exercise recommended.  5. Gastroesophageal reflux disease without esophagitis -Prilosec prescribed.  6. Chemotherapy follow-up examination -Most recent echocardiogram with EF above 65%.  She is on neoadjuvant oral antiestrogen therapy (trastuzumab/pertuzumab).  She will need an echocardiogram in 3 months.  We will check 1 in February.  She will see me back after this.  Disposition: Return in about 3 months (around 01/07/2021).  Medication Adjustments/Labs and Tests Ordered: Current medicines are reviewed at length with the patient today.  Concerns regarding medicines are outlined above.  Orders Placed This Encounter  Procedures  . ECHOCARDIOGRAM COMPLETE   Meds ordered this encounter  Medications  . torsemide (DEMADEX) 20 MG tablet    Sig: Take 2 tablets (40 mg total) by mouth daily.    Dispense:  180 tablet    Refill:  3  . spironolactone (ALDACTONE) 25 MG tablet    Sig: Take 1 tablet (25 mg total) by mouth daily.    Dispense:  90 tablet    Refill:  1  . losartan (COZAAR) 100 MG tablet    Sig: Take 1 tablet (100 mg total) by mouth daily.    Dispense:  90 tablet    Refill:  1  . amLODipine (NORVASC) 10 MG tablet    Sig: Take 1 tablet (10 mg total) by mouth daily.    Dispense:  30 tablet    Refill:  10  . carvedilol (COREG) 25 MG tablet    Sig: Take 1 tablet (25 mg total) by mouth 2 (two) times daily with a meal.    Dispense:  180 tablet    Refill:  3  . empagliflozin (JARDIANCE) 10 MG TABS tablet    Sig: Take 1 tablet (10 mg total) by mouth daily before breakfast.    Dispense:  90 tablet    Refill:  1  . omeprazole (PRILOSEC) 20 MG capsule    Sig: Take 1 capsule (20 mg  total) by mouth daily.    Dispense:  30 capsule    Refill:  11    Patient Instructions  Medication Instructions:  The current medical regimen is effective;  continue present plan and medications.  *If you need a refill on your cardiac medications before your next appointment, please call your pharmacy*  Testing/Procedures:  Echocardiogram - Your physician has requested that you have an echocardiogram. Echocardiography is a painless test that uses sound waves to create images of your heart. It provides your doctor with information about the size and shape of your heart and how well your heart's chambers and valves are working. This procedure takes approximately one hour. There are no restrictions for this procedure. This will be performed at our [facility] location - [address].    Follow-Up: At [facility], you and your health needs are our priority.  As part of our continuing mission to provide you with exceptional heart care, we have created designated Provider Care Teams.  These Care Teams include your primary Cardiologist (physician) and Advanced Practice Providers (APPs -  Physician Assistants and Nurse Practitioners) who all work together to provide you with the care you need, when you need it.  We recommend signing up for the patient portal called "MyChart".  Sign up information is provided  on this After Visit Summary.  MyChart is used to connect with patients for Virtual Visits (Telemedicine).  Patients are able to view lab/test results, encounter notes, upcoming appointments, etc.  Non-urgent messages can be sent to your provider as well.   To learn more about what you can do with MyChart, go to [email or web address].    Your next appointment:   3 month(s)  The format for your next appointment:   In Person  Provider:   Eleonore Chiquito, MD       Time Spent with Patient: I have spent a total of 35 minutes with patient reviewing hospital notes, telemetry,  EKGs, labs and examining the patient as well as establishing an assessment and plan that was discussed with the patient.  > 50% of time was spent in direct patient care.  Signed, Addison Naegeli. Audie Box, [place]  [address], [place], [place] [address] [phone]  10/09/2020 12:52 PM

## 2020-10-09 ENCOUNTER — Other Ambulatory Visit: Payer: Self-pay

## 2020-10-09 ENCOUNTER — Ambulatory Visit (INDEPENDENT_AMBULATORY_CARE_PROVIDER_SITE_OTHER): Payer: Medicare HMO | Admitting: Cardiovascular Disease

## 2020-10-09 ENCOUNTER — Encounter: Payer: Self-pay | Admitting: Cardiovascular Disease

## 2020-10-09 VITALS — BP 130/69 | HR 68 | Ht 63.0 in | Wt 222.0 lb

## 2020-10-09 DIAGNOSIS — K219 Gastro-esophageal reflux disease without esophagitis: Secondary | ICD-10-CM

## 2020-10-09 DIAGNOSIS — E782 Mixed hyperlipidemia: Secondary | ICD-10-CM | POA: Diagnosis not present

## 2020-10-09 DIAGNOSIS — I5032 Chronic diastolic (congestive) heart failure: Secondary | ICD-10-CM | POA: Diagnosis not present

## 2020-10-09 DIAGNOSIS — Z09 Encounter for follow-up examination after completed treatment for conditions other than malignant neoplasm: Secondary | ICD-10-CM

## 2020-10-09 DIAGNOSIS — I10 Essential (primary) hypertension: Secondary | ICD-10-CM

## 2020-10-09 MED ORDER — CARVEDILOL 25 MG PO TABS: 25.0000 mg | ORAL_TABLET | Freq: Two times a day (BID) | ORAL | 3 refills | Status: DC

## 2020-10-09 MED ORDER — TORSEMIDE 20 MG PO TABS: 40.0000 mg | ORAL_TABLET | Freq: Every day | ORAL | 3 refills | Status: DC

## 2020-10-09 MED ORDER — LOSARTAN POTASSIUM 100 MG PO TABS
100.0000 mg | ORAL_TABLET | Freq: Every day | ORAL | 1 refills | Status: DC
Start: 2020-10-09 — End: 2021-03-30

## 2020-10-09 MED ORDER — OMEPRAZOLE 20 MG PO CPDR: 20.0000 mg | DELAYED_RELEASE_CAPSULE | Freq: Every day | ORAL | 11 refills | Status: AC

## 2020-10-09 MED ORDER — EMPAGLIFLOZIN 10 MG PO TABS: 10.0000 mg | ORAL_TABLET | Freq: Every day | ORAL | 1 refills | Status: DC

## 2020-10-09 MED ORDER — AMLODIPINE BESYLATE 10 MG PO TABS: 10.0000 mg | ORAL_TABLET | Freq: Every day | ORAL | 10 refills | Status: DC

## 2020-10-09 MED ORDER — SPIRONOLACTONE 25 MG PO TABS
25.0000 mg | ORAL_TABLET | Freq: Every day | ORAL | 1 refills | Status: DC
Start: 2020-10-09 — End: 2021-03-30

## 2020-10-09 NOTE — Patient Instructions (Signed)
Medication Instructions:  The current medical regimen is effective;  continue present plan and medications.  *If you need a refill on your cardiac medications before your next appointment, please call your pharmacy*   Testing/Procedures: Echocardiogram - Your physician has requested that you have an echocardiogram. Echocardiography is a painless test that uses sound waves to create images of your heart. It provides your doctor with information about the size and shape of your heart and how well your heart's chambers and valves are working. This procedure takes approximately one hour. There are no restrictions for this procedure. This will be performed at our [address] location - [address], [address].    Follow-Up: At [facility], you and your health needs are our priority.  As part of our continuing mission to provide you with exceptional heart care, we have created designated Provider Care Teams.  These Care Teams include your primary Cardiologist (physician) and Advanced Practice Providers (APPs -  Physician Assistants and Nurse Practitioners) who all work together to provide you with the care you need, when you need it.  We recommend signing up for the patient portal called "MyChart".  Sign up information is provided on this After Visit Summary.  MyChart is used to connect with patients for Virtual Visits (Telemedicine).  Patients are able to view lab/test results, encounter notes, upcoming appointments, etc.  Non-urgent messages can be sent to your provider as well.   To learn more about what you can do with MyChart, go to [email or web address].    Your next appointment:   3 month(s)  The format for your next appointment:   In Person  Provider:   [place] [name], MD      

## 2020-10-16 NOTE — Progress Notes (Signed)
[facility]   [phone] [phone]   Clinic Follow up Note   Patient Care Team: [name], PA-C as PCP - General (Internal Medicine) [name], RN as Oncology Nurse Navigator [name], RN as Oncology Nurse Navigator [name], MD as Consulting Physician (General Surgery) [name], MD as Consulting Physician (Hematology) [name], MD as Consulting Physician (Radiation Oncology) [name], [name], MD as Consulting Physician (Cardiology)  Date of Service:  10/19/2020  CHIEF COMPLAINT: F/u of left breast cancer  SUMMARY OF ONCOLOGIC HISTORY: Oncology History Overview Note  Cancer Staging Malignant neoplasm of upper-outer quadrant of left breast in female, estrogen receptor positive ([place]) Staging form: Breast, AJCC 8th Edition - Clinical stage from 07/16/2020: Stage IB (cT2, cN1, cM0, G2, ER+, PR+, HER2+) - Signed by [name], MD on 07/21/2020    Malignant neoplasm of upper-outer quadrant of left breast in female, estrogen receptor positive ([place])  07/01/2020 Mammogram   IMPRESSION: 1. There is a 12 cm span of highly suspicious calcifications involving the upper outer and upper inner quadrants of the left breast. On ultrasound, within this area of calcifications, there are confluent masses at 1 o'clock spanning at least 4.5 cm. An additional suspicious mass is seen in the left breast at 2 o'clock.  -Ultrasound targeted to the left breast at 1 o'clock, 5 cm from the nipple demonstrates a broad area of hypoechoic irregular tissue/confluent masses spanning at least 4.5 cm. There is a separate oval hypoechoic mass with indistinct margins at 2 o'clock, 7 cm from the nipple measuring 0.9 x 0.4 x 0.9 cm. Ultrasound of the left axilla demonstrates 2 abnormally thickened lymph nodes with cortices measuring 6-7 mm. There is an additional lymph node with a borderline cortex of 4 mm.   2. There are 2 lymph nodes  with thickened cortices in the left axilla, and an additional lymph node with a borderline thickened cortex.   07/16/2020 Cancer Staging   Staging form: Breast, AJCC 8th Edition - Clinical stage from 07/16/2020: Stage IIA (cT3, cN1, cM0, G2, ER+, PR+, HER2+) - Signed by [name], MD on 08/05/2020   07/16/2020 Initial Biopsy   Diagnosis 1. 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 Breast, left, needle core biopsy, 1 o'clock - INVASIVE DUCTAL CARCINOMA - SEE COMMENT 3. Lymph node, biopsy - METASTATIC CARCINOMA INVOLVING A LYMPH NODE - SEE COMMENT Microscopic Comment 1. Based on the biopsy, the ductal carcinoma in situ has a comedo pattern, high nuclear grade and measures 0.7 cm in greatest linear extent. Prognostic markers (ER/PR) are pending and will be reported in an addendum. 2. Based on the biopsy, the carcinoma appears Nottingham grade 2 of 3 and measures 1.5 cm in greatest linear extent. Prognostic markers (ER/PR/ki-67/HER2) are pending and will be reported in an addendum. Dr. Saralyn Pilar reviewed the case and agrees with the above diagnosis. These results were called to [facility] on July 17, 2020. 3. Based on the biopsy, the carcinoma appears measures 1.0 cm in greatest linear extent. Prognostic markers (ER/PR/ki-67/HER2) are pending and will be reported in an addendum.   07/16/2020 Receptors her2   1. PROGNOSTIC INDICATORS Results: IMMUNOHISTOCHEMICAL AND MORPHOMETRIC ANALYSIS PERFORMED MANUALLY Estrogen Receptor: 30%, POSITIVE, STRONG STAINING INTENSITY Progesterone Receptor: 5%, POSITIVE, STRONG STAINING INTENSITY  2. PROGNOSTIC INDICATORS Results: IMMUNOHISTOCHEMICAL AND MORPHOMETRIC ANALYSIS PERFORMED MANUALLY The tumor cells are POSITIVE for  Her2 (3+). Estrogen Receptor: 85%, POSITIVE, STRONG STAINING INTENSITY Progesterone Receptor:  60%, POSITIVE, STRONG STAINING INTENSITY Proliferation Marker Ki67: 25%  3. PROGNOSTIC INDICATORS Results: IMMUNOHISTOCHEMICAL AND MORPHOMETRIC ANALYSIS PERFORMED MANUALLY The tumor cells are POSITIVE for Her2 (3+). Estrogen Receptor: 95%, POSITIVE, STRONG STAINING INTENSITY Progesterone Receptor: 45%, POSITIVE, STRONG STAINING INTENSITY Proliferation Marker Ki67: 30%   07/20/2020 Initial Diagnosis   Malignant neoplasm of upper-outer quadrant of left breast in female, estrogen receptor positive ([place])   07/31/2020 Echocardiogram   Baseline Echo  IMPRESSIONS     1. Left ventricular ejection fraction, by estimation, is 65 to 70%. The  left ventricle has normal function. The left ventricle has no regional  wall motion abnormalities. Left ventricular diastolic parameters are  indeterminate. The average left  ventricular global longitudinal strain is -17.1 %.   2. Right ventricular systolic function is normal. The right ventricular  size is normal. There is normal pulmonary artery systolic pressure.   3. The mitral valve is normal in structure. Trivial mitral valve  regurgitation. No evidence of mitral stenosis.   4. The aortic valve is normal in structure. Aortic valve regurgitation is  not visualized. No aortic stenosis is present.    07/31/2020 Breast MRI   IMPRESSION: 1. Extensive non mass enhancement within the left breast extending from the nipple to the chest wall. Overall findings are most compatible with extensive malignancy involving the majority of the left breast. 2. Indeterminate enhancing mass within the upper inner right breast. 3. Three morphologically abnormal lymph nodes within the left axilla. One of these nodes has been biopsied compatible with metastatic adenopathy.     08/03/2020 Imaging   Bone Scan whole body  IMPRESSION: Increased radiotracer uptake in the mid to lower lumbar regions of uncertain etiology. Correlation with radiography to assess  for potential arthropathy in these areas advised. Increased uptake in major joints is likely of arthropathic etiology. Increased uptake in the mid face is likely due to paranasal sinus disease. Distribution of radiotracer uptake in bony structures elsewhere unremarkable.   08/04/2020 Procedure   PAC placement by Dr [name]   08/05/2020 -  Neo-Adjuvant Anti-estrogen oral therapy   Neoadjuvant THP q3weeks for 4-6 cycles starting 08/05/20, followed by Herceptin or Kadcyla q3weeks to complete 1 year of treatment. Stopped THP after 1 dose due to poor toleration       -I switched her to [place] starting 08/25/20.   08/07/2020 Imaging   CT CAP  IMPRESSION: 2.7 cm mass in the upper outer left breast, likely corresponding to the patient's known primary breast neoplasm.   8 mm short axis left axillary node, suspicious for nodal metastasis in this clinical context.   No findings suspicious for distant metastasis.   Endometrium is mildly prominent, measuring 11 mm. Correlate for vaginal bleeding and consider pelvic ultrasound for further evaluation if clinically warranted.   08/11/2020 Pathology Results   Diagnosis Breast, right, needle core biopsy, right breast - PAPILLARY LESION - SEE COMMENT Microscopic Comment The biopsy consists of a papillary lesion with a focal area of adenosis. Immunohistochemistry (SMM, calponin and p63) shows retention of the myoepithelial cell layer in the focus of adenosis. 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 These results were called to [facility] on August 12, 2020.      CURRENT THERAPY:  Neo-adjuvant Kadcyla q3weeks starting 08/25/20  INTERVAL HISTORY:  Jamileth Putzier is here for a follow up. She presents to the clinic alone. Her daughter  was called to be included in the visit. She notes she had a negative COVID test after she tested positive on New years. She tested because a family member she  was near her was positive. She did not have COVID symptoms and did not require treatment. She was able to tolerate first dose Kadcyla that went well overall. She notes she had nausea and diarrhea after that first dose. She notes 6 weeks later she still has diarrhea. It took her 7-10 days before she felt back to baseline. She feels this is overall manageable to continue treatment. She notes she had syncope episode 2 days ago at church. This was due to low BG. She took insulin without eating. Although EMT came she did not want to be seen at that time. This has not recurred since. She currently takes 70 units BID and sliding scale insulin. She notes she is  No longer on Lasix but added another HTN medication. I reviewed her medication list with her. She notes her breast mass has decreased in size to her. She is still able to palpate it.     REVIEW OF SYSTEMS:   Constitutional: Denies fevers, chills or abnormal weight loss Eyes: Denies blurriness of vision Ears, nose, mouth, throat, and face: Denies mucositis or sore throat Respiratory: Denies cough, dyspnea or wheezes Cardiovascular: Denies palpitation, chest discomfort or lower extremity swelling Gastrointestinal:  Denies nausea, heartburn (+) diarrhea  Skin: Denies abnormal skin rashes MSK: (+) Upper leg pain, b/l  Lymphatics: Denies new lymphadenopathy or easy bruising Neurological:Denies numbness, tingling or new weaknesses Behavioral/Psych: Mood is stable, no new changes  All other systems were reviewed with the patient and are negative.  MEDICAL HISTORY:  Past Medical History:  Diagnosis Date  . Anxiety   . Breast cancer ([place])   . CHF (congestive heart failure) ([place])   . Diabetes mellitus without complication ([place])    type 2  . Hypertension     SURGICAL HISTORY: Past Surgical History:  Procedure Laterality Date  . APPENDECTOMY    . CHOLECYSTECTOMY    . COLONOSCOPY    . EYE SURGERY     cataracts  . MULTIPLE TOOTH EXTRACTIONS     . PORTACATH PLACEMENT N/A 08/04/2020   Procedure: INSERTION PORT-A-CATH WITH ULTRASOUND GUIDANCE;  Surgeon: [name], MD;  Location: [facility];  Service: General;  Laterality: N/A;  . STOMACH SURGERY      I have reviewed the social history and family history with the patient and they are unchanged from previous note.  ALLERGIES:  is allergic to coconut (cocos nucifera) allergy skin test, hydrochlorothiazide, lisinopril, and coconut oil.  MEDICATIONS:  Current Outpatient Medications  Medication Sig Dispense Refill  . prochlorperazine (COMPAZINE) 10 MG tablet Take 1 tablet (10 mg total) by mouth every 6 (six) hours as needed for nausea or vomiting. 30 tablet 1  . Accu-Chek FastClix Lancets MISC     . acetaminophen (TYLENOL) 500 MG tablet Take 500 mg by mouth every 4 (four) hours as needed for mild pain.    Marland Kitchen albuterol (VENTOLIN HFA) 108 (90 Base) MCG/ACT inhaler Inhale 1-2 puffs into the lungs every 4 (four) hours as needed for wheezing or shortness of breath. 8 g 0  . amLODipine (NORVASC) 10 MG tablet Take 1 tablet (10 mg total) by mouth daily. 30 tablet 10  . aspirin 81 MG chewable tablet Chew 81 mg by mouth daily.     . carvedilol (COREG) 25 MG tablet Take 1 tablet (25 mg total)  by mouth 2 (two) times daily with a meal. 180 tablet 3  . Cholecalciferol 25 MCG (1000 UT) capsule Take 1,000 Units by mouth daily.    . diphenoxylate-atropine (LOMOTIL) 2.5-0.025 MG tablet Take 1-2 tablets by mouth 4 (four) times daily as needed for diarrhea or loose stools. 60 tablet 2  . DULoxetine (CYMBALTA) 30 MG capsule Take 30 mg by mouth daily.    . empagliflozin (JARDIANCE) 10 MG TABS tablet Take 1 tablet (10 mg total) by mouth daily before breakfast. 90 tablet 1  . fluorometholone (FML) 0.1 % ophthalmic suspension Place 1 drop into both eyes daily.    . folic acid (FOLVITE) 1 MG tablet Take 1 tablet by mouth daily.    Marland Kitchen gabapentin (NEURONTIN) 400 MG capsule Take 400 mg by mouth 3 (three) times daily.      Marland Kitchen glipiZIDE (GLUCOTROL XL) 10 MG 24 hr tablet Take 10 mg by mouth daily with breakfast.     . HYDROcodone-acetaminophen (NORCO/VICODIN) 5-325 MG tablet Take 1 tablet by mouth every 6 (six) hours as needed for moderate pain. 15 tablet 0  . insulin aspart (NOVOLOG) 100 UNIT/ML injection Inject 2-10 Units into the skin 3 (three) times daily before meals. Per sliding scale:  150-199 = 2 units, 200-249 = 4 units, 250-299 = 6 units, 300-349 = 8 units, greater than350 = 10 units.    . insulin glargine (LANTUS SOLOSTAR) 100 UNIT/ML Solostar Pen Inject 35 Units into the skin 2 (two) times daily. (Patient taking differently: Inject 35 Units into the skin 2 (two) times daily. Pt takes 70 units in the morning and 70 units at night.) 15 mL 11  . lidocaine-prilocaine (EMLA) cream APPLY TO AFFECTED AREA 1 time DAILY 30 g 3  . losartan (COZAAR) 100 MG tablet Take 1 tablet (100 mg total) by mouth daily. 90 tablet 1  . metFORMIN (GLUCOPHAGE) 500 MG tablet Take 500 mg by mouth 2 (two) times daily with a meal.     . Nutritional Supplements (FEEDING SUPPLEMENT, GLUCERNA 1.2 CAL,) LIQD Take 237 mLs by mouth 3 (three) times daily with meals.    . Omega-3 Fatty Acids (FISH OIL) 1000 MG CAPS Take 1,000 mg by mouth daily.    Marland Kitchen omeprazole (PRILOSEC) 20 MG capsule Take 1 capsule (20 mg total) by mouth daily. 30 capsule 11  . ondansetron (ZOFRAN-ODT) 4 MG disintegrating tablet Take 1 tablet (4 mg total) by mouth 2 (two) times daily as needed for nausea or vomiting. 30 tablet 2  . oxyCODONE (OXY IR/ROXICODONE) 5 MG immediate release tablet Take 1-2 tablets (5-10 mg total) by mouth every 8 (eight) hours as needed for severe pain. 15 tablet 0  . potassium chloride (KLOR-CON) 10 MEQ tablet Take 20 mEq by mouth daily. DAILY WITH LASIX    . RESTASIS 0.05 % ophthalmic emulsion Place 1 drop into both eyes 2 (two) times daily.    . rosuvastatin (CRESTOR) 5 MG tablet Take 5 mg by mouth at bedtime.    Marland Kitchen spironolactone (ALDACTONE) 25 MG  tablet Take 1 tablet (25 mg total) by mouth daily. 90 tablet 1  . tiZANidine (ZANAFLEX) 4 MG tablet Take 4 mg by mouth 2 (two) times daily as needed for muscle spasms.     Marland Kitchen torsemide (DEMADEX) 20 MG tablet Take 2 tablets (40 mg total) by mouth daily. 180 tablet 3  . vitamin E 180 MG (400 UNITS) capsule Take 400 Units by mouth daily.     No current facility-administered medications for  this visit.    PHYSICAL EXAMINATION: ECOG PERFORMANCE STATUS: 2 - Symptomatic, <50% confined to bed  Vitals:   10/19/20 1337  BP: 137/73  Pulse: 75  Resp: 16  Temp: 97.6 F (36.4 C)  SpO2: 99%   Filed Weights   10/19/20 1337  Weight: 216 lb 9.6 oz (98.2 kg)    GENERAL:alert, no distress and comfortable SKIN: skin color, texture, turgor are normal, no rashes or significant lesions EYES: normal, Conjunctiva are pink and non-injected, sclera clear  NECK: supple, thyroid normal size, non-tender, without nodularity LYMPH:  no palpable lymphadenopathy in the cervical, axillary  LUNGS: clear to auscultation and percussion with normal breathing effort HEART: regular rate & rhythm and no murmurs and no lower extremity edema ABDOMEN:abdomen soft, non-tender and normal bowel sounds Musculoskeletal:no cyanosis of digits and no clubbing  NEURO: alert & oriented x 3 with fluent speech, no focal motor/sensory deficits BREAST: (+) Her 7x4cm mass in Inner-outer Upper breast from 10-1:00 position is no longer palpable (+)1.5-2cm palpable Left axillary LN  EXAM PERFORMED IN WHEELCHAIR TODAY   LABORATORY DATA:  I have reviewed the data as listed CBC Latest Ref Rng & Units 10/19/2020 09/01/2020 08/27/2020  WBC 4.0 - 10.5 K/uL 7.3 8.9 9.8  Hemoglobin 12.0 - 15.0 g/dL 12.1 10.3(L) 11.7(L)  Hematocrit 36.0 - 46.0 % 38.5 32.3(L) 37.5  Platelets 150 - 400 K/uL 238 277 341     CMP Latest Ref Rng & Units 09/01/2020 08/27/2020 08/26/2020  Glucose 70 - 99 mg/dL 293(H) 242(H) 154(H)  BUN 8 - 23 mg/dL 14 27(H) 14   Creatinine 0.44 - 1.00 mg/dL 1.02(H) 1.24(H) 0.73  Sodium 135 - 145 mmol/L 140 137 139  Potassium 3.5 - 5.1 mmol/L 3.8 4.5 3.3(L)  Chloride 98 - 111 mmol/L 108 100 101  CO2 22 - 32 mmol/L 30 29 26   Calcium 8.9 - 10.3 mg/dL 10.1 10.1 10.0  Total Protein 6.5 - 8.1 g/dL 6.9 - 7.2  Total Bilirubin 0.3 - 1.2 mg/dL 0.5 - 0.6  Alkaline Phos 38 - 126 U/L 67 - 63  AST 15 - 41 U/L 11(L) - 14(L)  ALT 0 - 44 U/L 17 - 16      RADIOGRAPHIC STUDIES: I have personally reviewed the radiological images as listed and agreed with the findings in the report. No results found.   ASSESSMENT & PLAN:  Cheryl Blankenship is a 70 y.o. female with   1.Malignant neoplasm of upper-outer quadrant of left breast, StageII, c(T2N1M0), ER+/PR+/HER2+, GradeII -She was diagnosed in 06/2020.She has 12cm calcification and 4.5cm mass in her left breast with2 abnormalLN. 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 -HerMRI Breast from 07/31/20 shows very extensive disease in her left breast and 3 abnormal LNs in the left axilla. There isindeterminate enhancing67mmass within the upper inner right breast. -Her 08/11/20 right breast biopsy showed benign papillary lesion. -Herbone scan from 08/03/20 shows uptake in her lumbar spine, but nonspecific. She has known lumbar back pain from arthritis. Her CT CAP from 08/07/20 showed known breast masses and left axillary LN andnodistant metastasis. She has mild thickening of endometrium, pt denies vaginal bleeding. -Given theextensivedisease in her left breast,shewillneedmastectomy with Dr TCindra Evesthe only way to cure her cancer. -She started neoadjuvant chemoTHP q3weeks on11/17/21. Due to poor toleration after C1, she was switched to [facility] 09/04/20. Plan for 5-6 cycles in total. C2 postponed due to COVID (+) in early  09/2020.  -She tolerated first  Kadcyla moderately well with nausea and diarrhea. I reviewed management with her. She feels this is manageable to continue treatment.  -After 1 dose THP and 1 Cycle Kadcyla her left breast mass is no longer palpable on exam. Her left axillary LN is still palpable. This indicates good clinical response to treatment. -Labs reviewed and adequate to proceed with C2 Kadcyla today.  -Phone visit next week. OV visit in 3 weeks.   2. Symptoms Management: General body pain, Diarrhea, N&V  -With first week on THP she had general body pain, mainly in legs, shoulders and neck, diarrhea, N&V, Fatigue.   -For pain Norco was not enough so I started her on Oxycodone up to 2 tablets q8hours (08/11/20).  -Her THP was changed to Kadcyla on 09/04/20.  -She did have mild intermittent nausea and diarrhea with first cycle Kadcyla, but more manageable than on THP. Will continue Zofran and compazine for nausea and imodium and lomotil for her diarrhea.  -She continues to have upper leg pain with cramping. She notes she is taking her potassium. I recommend she increase her Cymbalta.  -I recommend she drink 30-40 ounces of liquid a day.   3. Comorbidities: CHF,poorly controlledDM with neuropathy, HTN, Arthritis in hips, Elevated Cr  -Due to Nausea and stomach pain from Jardiance, she takes Zofran as needed.  -Continue medications and f/u with PCP and Cardiologist Dr. Marisue Ivan.  -She had 2 falls in 2021 due to multiple medications and poor sleep. She ambulates with cane.  -Pt is on Lasix which she notes leads to urinary incontinence. She wears depends for this. -Her baseline ECHO from 07/31/20 was normal. Will monitor on Herceptin.  -She has had increased LE edema from her recent CHF exacerbation. She will continue to f/u with Cardiologist.  -She had a syncope episode in [place] the last week of 09/2020. She took her insulin without eating. She is currently on 70 units of insulin and sliding scale insulin. I advised her to  eat regularly with her insulin. She will continue to f/u with her PCP and our dietician.   4. Genetic testing  -Given her mild family history of cancer (mother with Ovarian, niece and 2 cousins with cancer) she is eligible for genetic testing. She is agreeable to proceed.   5. Social Support -She does have decreased memory, she is on medication. Her family help her with history. -She lives in [place] alone in an apartmentwith other older women. Sheis currently able to take care of herself independently. Her daughter is in town and her son is in [place]. She has other non-biological children.  -She plan to have care given at home soon.She has Medicare and Medicaid.  6. Leg edema, L>R -Developed since last week, likely related to IVF and low protein level -will get Doppler showed out DVT  7. COVID (+) in early 09/2020  -This was contracted after family gathering. She was asymptomatic and did not require treatment. She tested negative in later 09/2020.  -She has recovered well   PLAN: -Labs reviewed and adequate to proceed with C2 Kadcyla today  -Phone visit next week  -Lab, flush, F/u and Kadcyla in 3, 6, 9 weeks.    No problem-specific Assessment & Plan notes found for this encounter.   No orders of the defined types were placed in this encounter.  All questions were answered. The patient knows to call the clinic with any problems, questions or concerns. No barriers to learning was detected. The  total time spent in the appointment was 30 minutes.     [name], MD 10/19/2020   I, Joslyn Devon, am acting as scribe for [name], MD.   I have reviewed the above documentation for accuracy and completeness, and I agree with the above.

## 2020-10-19 ENCOUNTER — Inpatient Hospital Stay: Payer: Medicare HMO

## 2020-10-19 ENCOUNTER — Inpatient Hospital Stay: Payer: Medicare HMO | Admitting: Nutrition

## 2020-10-19 ENCOUNTER — Inpatient Hospital Stay: Payer: Medicare HMO | Attending: Hematology

## 2020-10-19 ENCOUNTER — Encounter: Payer: Self-pay | Admitting: Hematology

## 2020-10-19 ENCOUNTER — Other Ambulatory Visit: Payer: Self-pay

## 2020-10-19 ENCOUNTER — Inpatient Hospital Stay (HOSPITAL_BASED_OUTPATIENT_CLINIC_OR_DEPARTMENT_OTHER): Payer: Medicare HMO | Admitting: Hematology

## 2020-10-19 VITALS — BP 137/73 | HR 75 | Temp 97.6°F | Resp 16 | Ht 63.0 in | Wt 216.6 lb

## 2020-10-19 DIAGNOSIS — C50412 Malignant neoplasm of upper-outer quadrant of left female breast: Secondary | ICD-10-CM

## 2020-10-19 DIAGNOSIS — C773 Secondary and unspecified malignant neoplasm of axilla and upper limb lymph nodes: Secondary | ICD-10-CM | POA: Diagnosis not present

## 2020-10-19 DIAGNOSIS — Z170 Estrogen receptor positive status [ER+]: Secondary | ICD-10-CM

## 2020-10-19 DIAGNOSIS — Z5112 Encounter for antineoplastic immunotherapy: Secondary | ICD-10-CM | POA: Insufficient documentation

## 2020-10-19 LAB — CBC WITH DIFFERENTIAL (CANCER CENTER ONLY)
Abs Immature Granulocytes: 0.02 10*3/uL (ref 0.00–0.07)
Basophils Absolute: 0 10*3/uL (ref 0.0–0.1)
Basophils Relative: 1 %
Eosinophils Absolute: 0.2 10*3/uL (ref 0.0–0.5)
Eosinophils Relative: 3 %
HCT: 38.5 % (ref 36.0–46.0)
Hemoglobin: 12.1 g/dL (ref 12.0–15.0)
Immature Granulocytes: 0 %
Lymphocytes Relative: 45 %
Lymphs Abs: 3.3 10*3/uL (ref 0.7–4.0)
MCH: 27.3 pg (ref 26.0–34.0)
MCHC: 31.4 g/dL (ref 30.0–36.0)
MCV: 86.9 fL (ref 80.0–100.0)
Monocytes Absolute: 0.4 10*3/uL (ref 0.1–1.0)
Monocytes Relative: 5 %
Neutro Abs: 3.4 10*3/uL (ref 1.7–7.7)
Neutrophils Relative %: 46 %
Platelet Count: 238 10*3/uL (ref 150–400)
RBC: 4.43 MIL/uL (ref 3.87–5.11)
RDW: 13.2 % (ref 11.5–15.5)
WBC Count: 7.3 10*3/uL (ref 4.0–10.5)
nRBC: 0 % (ref 0.0–0.2)

## 2020-10-19 LAB — CMP (CANCER CENTER ONLY)
ALT: 15 U/L (ref 0–44)
AST: 13 U/L — ABNORMAL LOW (ref 15–41)
Albumin: 3.5 g/dL (ref 3.5–5.0)
Alkaline Phosphatase: 63 U/L (ref 38–126)
Anion gap: 6 (ref 5–15)
BUN: 29 mg/dL — ABNORMAL HIGH (ref 8–23)
CO2: 29 mmol/L (ref 22–32)
Calcium: 10.4 mg/dL — ABNORMAL HIGH (ref 8.9–10.3)
Chloride: 103 mmol/L (ref 98–111)
Creatinine: 1.35 mg/dL — ABNORMAL HIGH (ref 0.44–1.00)
GFR, Estimated: 43 mL/min — ABNORMAL LOW (ref >60)
Glucose, Bld: 223 mg/dL — ABNORMAL HIGH (ref 70–99)
Potassium: 4.1 mmol/L (ref 3.5–5.1)
Sodium: 138 mmol/L (ref 135–145)
Total Bilirubin: 0.4 mg/dL (ref 0.3–1.2)
Total Protein: 7 g/dL (ref 6.5–8.1)

## 2020-10-19 MED ORDER — SODIUM CHLORIDE 0.9% FLUSH
10.0000 mL | INTRAVENOUS | Status: DC | PRN
  Administered 2020-10-19: 10 mL
  Filled 2020-10-19: qty 10

## 2020-10-19 MED ORDER — SODIUM CHLORIDE 0.9 % IV SOLN
Freq: Once | INTRAVENOUS | Status: AC
  Filled 2020-10-19: qty 250

## 2020-10-19 MED ORDER — DIPHENHYDRAMINE HCL 25 MG PO CAPS
50.0000 mg | ORAL_CAPSULE | Freq: Once | ORAL | Status: AC
  Administered 2020-10-19: 50 mg via ORAL

## 2020-10-19 MED ORDER — SODIUM CHLORIDE 0.9 % IV SOLN
3.6000 mg/kg | Freq: Once | INTRAVENOUS | Status: AC
  Administered 2020-10-19: 360 mg via INTRAVENOUS
  Filled 2020-10-19: qty 13

## 2020-10-19 MED ORDER — DIPHENHYDRAMINE HCL 25 MG PO CAPS
ORAL_CAPSULE | ORAL | Status: AC
  Filled 2020-10-19: qty 2

## 2020-10-19 MED ORDER — ACETAMINOPHEN 325 MG PO TABS
650.0000 mg | ORAL_TABLET | Freq: Once | ORAL | Status: AC
  Administered 2020-10-19: 650 mg via ORAL

## 2020-10-19 MED ORDER — ACETAMINOPHEN 325 MG PO TABS
ORAL_TABLET | ORAL | Status: AC
  Filled 2020-10-19: qty 2

## 2020-10-19 MED ORDER — SODIUM CHLORIDE 0.9% FLUSH
10.0000 mL | Freq: Once | INTRAVENOUS | Status: AC | PRN
  Administered 2020-10-19: 10 mL
  Filled 2020-10-19: qty 10

## 2020-10-19 MED ORDER — ONDANSETRON 4 MG PO TBDP: 4.0000 mg | ORAL_TABLET | Freq: Two times a day (BID) | ORAL | 2 refills | Status: DC | PRN

## 2020-10-19 MED ORDER — HEPARIN SOD (PORK) LOCK FLUSH 100 UNIT/ML IV SOLN
500.0000 [IU] | Freq: Once | INTRAVENOUS | Status: AC | PRN
  Administered 2020-10-19: 500 [IU]
  Filled 2020-10-19: qty 5

## 2020-10-19 MED ORDER — PROCHLORPERAZINE MALEATE 10 MG PO TABS: 10.0000 mg | ORAL_TABLET | Freq: Four times a day (QID) | ORAL | 1 refills | Status: DC | PRN

## 2020-10-19 NOTE — Patient Instructions (Signed)
Ado-Trastuzumab Emtansine for injection What is this medicine? ADO-TRASTUZUMAB EMTANSINE (ADD oh traz TOO zuh mab em TAN zine) is a monoclonal antibody combined with chemotherapy. It is used to treat breast cancer. This medicine may be used for other purposes; ask your health care provider or pharmacist if you have questions. COMMON BRAND NAME(S): Kadcyla What should I tell my health care provider before I take this medicine? They need to know if you have any of these conditions:  heart disease  heart failure  infection (especially a virus infection such as chickenpox, cold sores, or herpes)  liver disease  lung or breathing disease, like asthma  tingling of the fingers or toes, or other nerve disorder  an unusual or allergic reaction to ado-trastuzumab emtansine, other medications, foods, dyes, or preservatives  pregnant or trying to get pregnant  breast-feeding How should I use this medicine? This medicine is for infusion into a vein. It is given by a health care professional in a hospital or clinic setting. Talk to your pediatrician regarding the use of this medicine in children. Special care may be needed. Overdosage: If you think you have taken too much of this medicine contact a poison control center or emergency room at once. NOTE: This medicine is only for you. Do not share this medicine with others. What if I miss a dose? It is important not to miss your dose. Call your doctor or health care professional if you are unable to keep an appointment. What may interact with this medicine? This medicine may also interact with the following medications:  atazanavir  boceprevir  clarithromycin  delavirdine  indinavir  dalfopristin; quinupristin  isoniazid, INH  itraconazole  ketoconazole  nefazodone  nelfinavir  ritonavir  telaprevir  telithromycin  tipranavir  voriconazole This list may not describe all possible interactions. Give your health care  provider a list of all the medicines, herbs, non-prescription drugs, or dietary supplements you use. Also tell them if you smoke, drink alcohol, or use illegal drugs. Some items may interact with your medicine. What should I watch for while using this medicine? Visit your doctor for checks on your progress. This drug may make you feel generally unwell. This is not uncommon, as chemotherapy can affect healthy cells as well as cancer cells. Report any side effects. Continue your course of treatment even though you feel ill unless your doctor tells you to stop. You may need blood work done while you are taking this medicine. Call your doctor or health care professional for advice if you get a fever, chills or sore throat, or other symptoms of a cold or flu. Do not treat yourself. This drug decreases your body's ability to fight infections. Try to avoid being around people who are sick. Be careful brushing and flossing your teeth or using a toothpick because you may get an infection or bleed more easily. If you have any dental work done, tell your dentist you are receiving this medicine. Avoid taking products that contain aspirin, acetaminophen, ibuprofen, naproxen, or ketoprofen unless instructed by your doctor. These medicines may hide a fever. Do not become pregnant while taking this medicine or for 7 months after stopping it, men with female partners should use contraception during treatment and for 4 months after the last dose. Women should inform their doctor if they wish to become pregnant or think they might be pregnant. There is a potential for serious side effects to an unborn child. Do not breast-feed an infant while taking this medicine or   for 7 months after the last dose. Men who have a partner who is pregnant or who is capable of becoming pregnant should use a condom during sexual activity while taking this medicine and for 4 months after stopping it. Men should inform their doctors if they wish to  father a child. This medicine may lower sperm counts. Talk to your health care professional or pharmacist for more information. What side effects may I notice from receiving this medicine? Side effects that you should report to your doctor or health care professional as soon as possible:  allergic reactions like skin rash, itching or hives, swelling of the face, lips, or tongue  breathing problems  chest pain or palpitations  fever or chills, sore throat  general ill feeling or flu-like symptoms  light-colored stools  nausea, vomiting  pain, tingling, numbness in the hands or feet  signs and symptoms of bleeding such as bloody or black, tarry stools; red or dark-brown urine; spitting up blood or brown material that looks like coffee grounds; red spots on the skin; unusual bruising or bleeding from the eye, gums, or nose  swelling of the legs or ankles  yellowing of the eyes or skin Side effects that usually do not require medical attention (report to your doctor or health care professional if they continue or are bothersome):  changes in taste  constipation  dizziness  headache  joint pain  muscle pain  trouble sleeping  unusually weak or tired This list may not describe all possible side effects. Call your doctor for medical advice about side effects. You may report side effects to FDA at 1-800-FDA-1088. Where should I keep my medicine? This drug is given in a hospital or clinic and will not be stored at home. NOTE: This sheet is a summary. It may not cover all possible information. If you have questions about this medicine, talk to your doctor, pharmacist, or health care provider.  2020 Elsevier/Gold Standard (2018-02-02 10:03:15)  

## 2020-10-19 NOTE — Patient Instructions (Signed)
Lab, flush, f/u and Kadcyla in 3 and 6 weeks  Phone visit in a week

## 2020-10-19 NOTE — Progress Notes (Signed)
Nutrition follow-up completed with patient during infusion for breast cancer. Weight decreased slightly and documented as 216.6 pounds on January 31. Noted labs: Glucose 223, BUN 29, creatinine 1.35. Patient reports she has occasional nausea.  She uses antiemetics as needed. She has Imodium and Lomotil for ongoing diarrhea. Reports she is trying to eat well so her HG A1c decreases.  Nutrition diagnosis: Inadequate oral intake improved.  Intervention: Encouraged patient to continue no concentrated sweets diet.   Recommended patient try to consume smaller amounts at meals with snacks as needed include a protein source at each meal and snack. Provided coupons for Glucerna or boost glucose control. Questions were answered.  Teach back method used. If patient needs further diabetic education recommend referred to outpatient nutrition and diabetes education services.  Monitoring, evaluation, goals: Patient will tolerate adequate calories and protein for weight stabilization and adequate glycemic control.  No follow-up scheduled.  Patient has contact information for questions or concerns.  **Disclaimer: This note was dictated with voice recognition software. Similar sounding words can inadvertently be transcribed and this note may contain transcription errors which may not have been corrected upon publication of note.**

## 2020-10-20 ENCOUNTER — Encounter: Payer: Self-pay | Admitting: *Deleted

## 2020-10-20 ENCOUNTER — Telehealth: Payer: Self-pay

## 2020-10-20 ENCOUNTER — Encounter: Payer: Self-pay | Admitting: Hematology

## 2020-10-20 NOTE — Telephone Encounter (Signed)
Made aware of most recent labs hold calcium increase fluid intake. Pt and family aware

## 2020-10-20 NOTE — Telephone Encounter (Signed)
-----   Message from Alla Feeling, NP sent at 10/19/2020  7:35 PM EST ----- Please let her know CMP shows mild renal dysfunction and elevated calcium. Have her hold any calcium supplement and drink more water.   Thanks, Regan Rakers, NP

## 2020-10-21 ENCOUNTER — Telehealth: Payer: Self-pay

## 2020-10-21 NOTE — Telephone Encounter (Signed)
Ms Dema Severin called stating that Ms Pavon called her stating she is having burning and pain in her legs.  I called Ms Cassell and had a three way call with Ms Dema Severin.  Ms Hundertmark states she is having pain just under her umbilicus. She has not had a bm in 2 days.  I recommended miralax twice daily.  Ms Dema Severin then mentioned Ms Abrigo was restarted on Jardiance and she had this abd pain when on jardiance in the past.  I instructed them to call the physician who ordered the jardiance.  They verbalized understanding.

## 2020-10-26 ENCOUNTER — Emergency Department (HOSPITAL_COMMUNITY)
Admission: EM | Admit: 2020-10-26 | Discharge: 2020-10-27 | Disposition: A | Discharge status: Home / Self Care | Payer: Medicare HMO | Attending: Emergency Medicine | Admitting: Emergency Medicine

## 2020-10-26 ENCOUNTER — Emergency Department (HOSPITAL_COMMUNITY): Payer: Medicare HMO

## 2020-10-26 ENCOUNTER — Other Ambulatory Visit: Payer: Self-pay

## 2020-10-26 ENCOUNTER — Encounter (HOSPITAL_COMMUNITY): Payer: Self-pay

## 2020-10-26 ENCOUNTER — Telehealth: Payer: Self-pay

## 2020-10-26 DIAGNOSIS — I110 Hypertensive heart disease with heart failure: Secondary | ICD-10-CM | POA: Insufficient documentation

## 2020-10-26 DIAGNOSIS — Z7982 Long term (current) use of aspirin: Secondary | ICD-10-CM | POA: Diagnosis not present

## 2020-10-26 DIAGNOSIS — Z794 Long term (current) use of insulin: Secondary | ICD-10-CM | POA: Insufficient documentation

## 2020-10-26 DIAGNOSIS — I5031 Acute diastolic (congestive) heart failure: Secondary | ICD-10-CM | POA: Insufficient documentation

## 2020-10-26 DIAGNOSIS — E119 Type 2 diabetes mellitus without complications: Secondary | ICD-10-CM | POA: Diagnosis not present

## 2020-10-26 DIAGNOSIS — Z79899 Other long term (current) drug therapy: Secondary | ICD-10-CM | POA: Insufficient documentation

## 2020-10-26 DIAGNOSIS — R1010 Upper abdominal pain, unspecified: Secondary | ICD-10-CM | POA: Insufficient documentation

## 2020-10-26 DIAGNOSIS — R0789 Other chest pain: Secondary | ICD-10-CM

## 2020-10-26 DIAGNOSIS — Z7984 Long term (current) use of oral hypoglycemic drugs: Secondary | ICD-10-CM | POA: Diagnosis not present

## 2020-10-26 DIAGNOSIS — R110 Nausea: Secondary | ICD-10-CM | POA: Insufficient documentation

## 2020-10-26 DIAGNOSIS — Z87891 Personal history of nicotine dependence: Secondary | ICD-10-CM | POA: Diagnosis not present

## 2020-10-26 DIAGNOSIS — R079 Chest pain, unspecified: Secondary | ICD-10-CM | POA: Diagnosis not present

## 2020-10-26 DIAGNOSIS — Z853 Personal history of malignant neoplasm of breast: Secondary | ICD-10-CM | POA: Insufficient documentation

## 2020-10-26 LAB — BASIC METABOLIC PANEL
Anion gap: 15 (ref 5–15)
BUN: 21 mg/dL (ref 8–23)
CO2: 26 mmol/L (ref 22–32)
Calcium: 10.6 mg/dL — ABNORMAL HIGH (ref 8.9–10.3)
Chloride: 100 mmol/L (ref 98–111)
Creatinine, Ser: 1.33 mg/dL — ABNORMAL HIGH (ref 0.44–1.00)
GFR, Estimated: 44 mL/min — ABNORMAL LOW (ref >60)
Glucose, Bld: 241 mg/dL — ABNORMAL HIGH (ref 70–99)
Potassium: 3.6 mmol/L (ref 3.5–5.1)
Sodium: 141 mmol/L (ref 135–145)

## 2020-10-26 LAB — LIPASE, BLOOD: Lipase: 24 U/L (ref 11–51)

## 2020-10-26 LAB — CBC
HCT: 40.1 % (ref 36.0–46.0)
Hemoglobin: 12.9 g/dL (ref 12.0–15.0)
MCH: 28.2 pg (ref 26.0–34.0)
MCHC: 32.2 g/dL (ref 30.0–36.0)
MCV: 87.6 fL (ref 80.0–100.0)
Platelets: 104 10*3/uL — ABNORMAL LOW (ref 150–400)
RBC: 4.58 MIL/uL (ref 3.87–5.11)
RDW: 13.3 % (ref 11.5–15.5)
WBC: 6.9 10*3/uL (ref 4.0–10.5)
nRBC: 0 % (ref 0.0–0.2)

## 2020-10-26 LAB — HEPATIC FUNCTION PANEL
ALT: 29 U/L (ref 0–44)
AST: 38 U/L (ref 15–41)
Albumin: 3.9 g/dL (ref 3.5–5.0)
Alkaline Phosphatase: 67 U/L (ref 38–126)
Bilirubin, Direct: 0.1 mg/dL (ref 0.0–0.2)
Indirect Bilirubin: 0.7 mg/dL (ref 0.3–0.9)
Total Bilirubin: 0.8 mg/dL (ref 0.3–1.2)
Total Protein: 7.9 g/dL (ref 6.5–8.1)

## 2020-10-26 LAB — TROPONIN I (HIGH SENSITIVITY)
Troponin I (High Sensitivity): 9 ng/L (ref <18)
Troponin I (High Sensitivity): 17 ng/L (ref <18)

## 2020-10-26 LAB — D-DIMER, QUANTITATIVE: D-Dimer, Quant: 2.18 ug/mL-FEU — ABNORMAL HIGH (ref 0.00–0.50)

## 2020-10-26 IMAGING — CR DG CHEST 2V
2 series · 2 of 2 positions shown · non-contrast
Comparison: [DATE]

CLINICAL DATA: Chest pain

EXAM:
CHEST - 2 VIEW

[w chest pa]
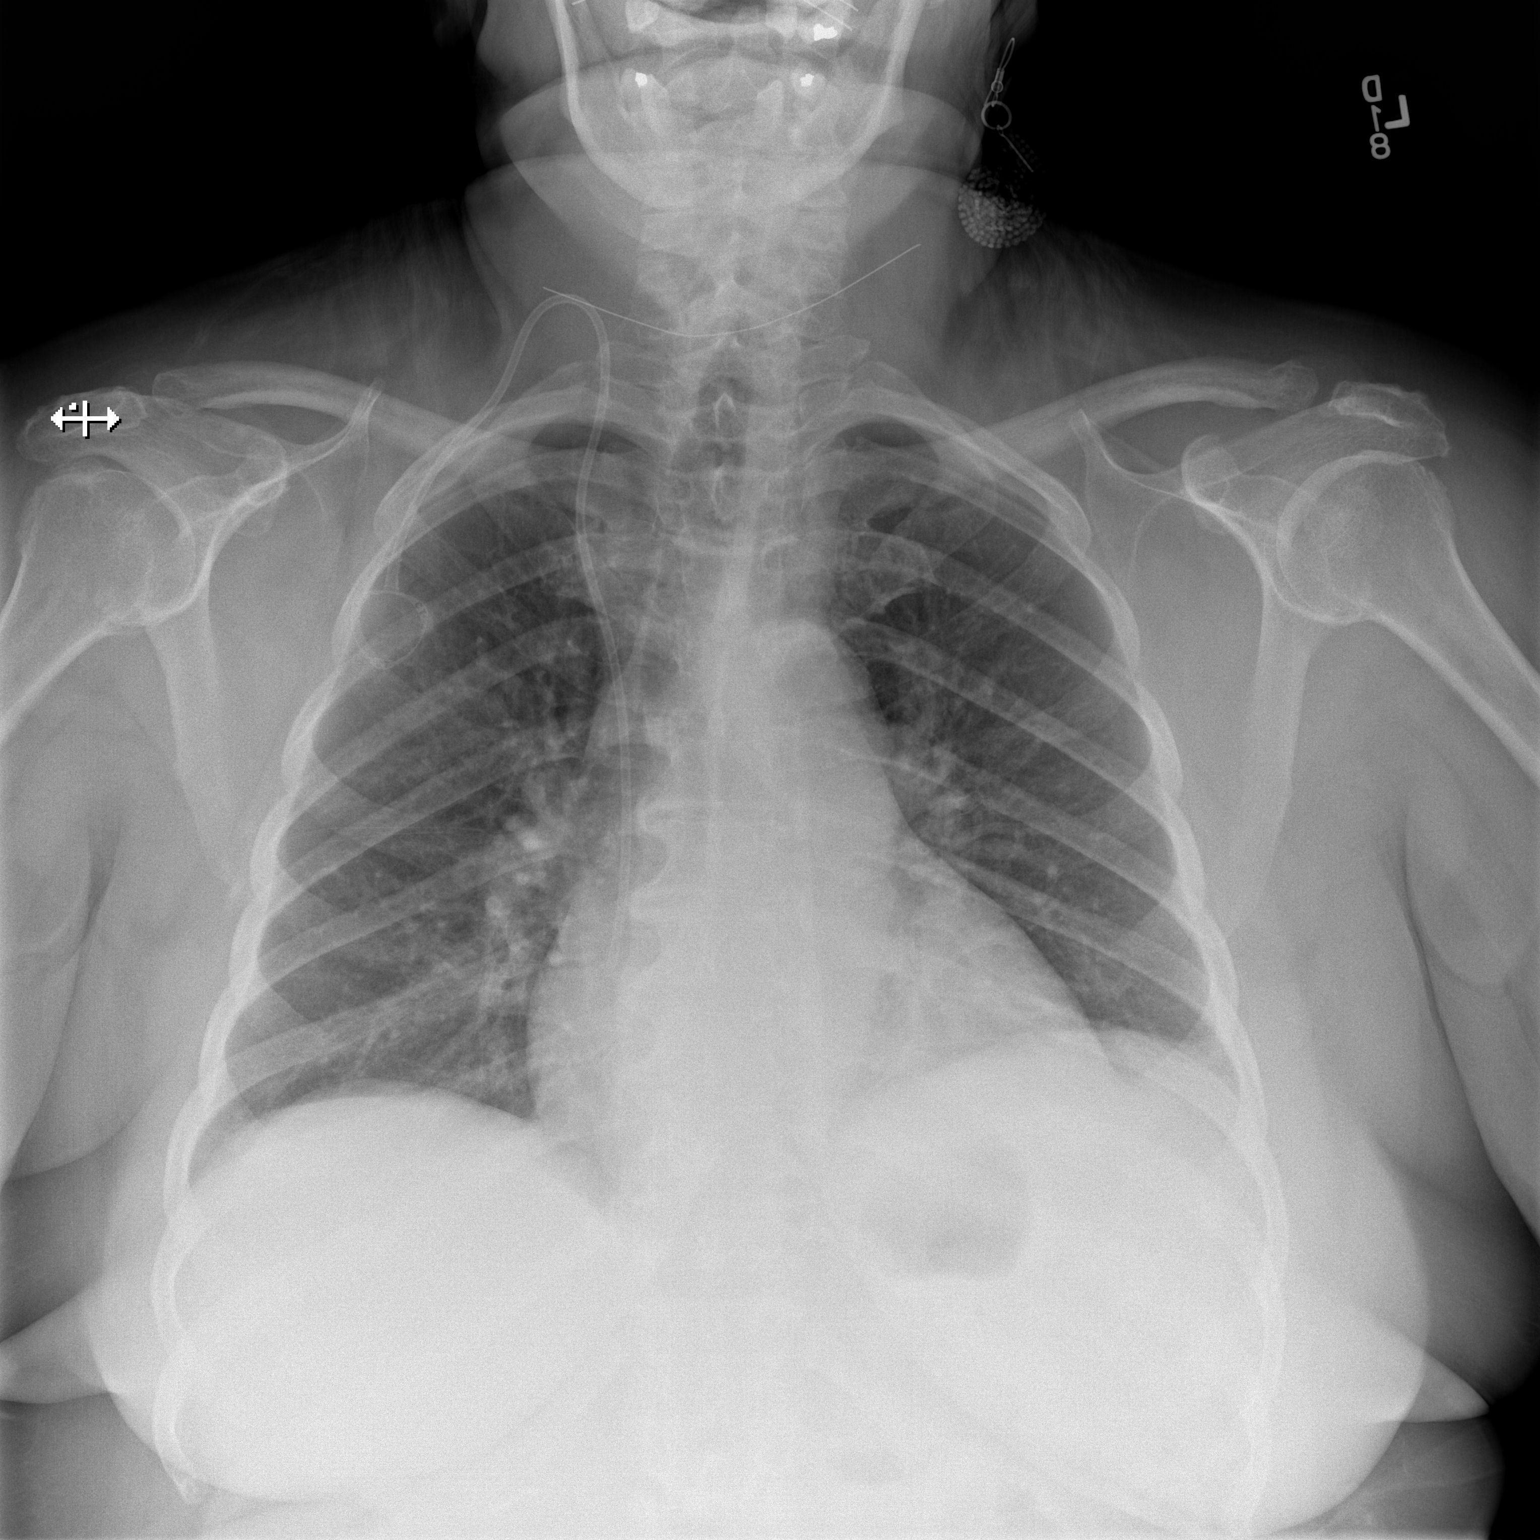

[w chest lat]
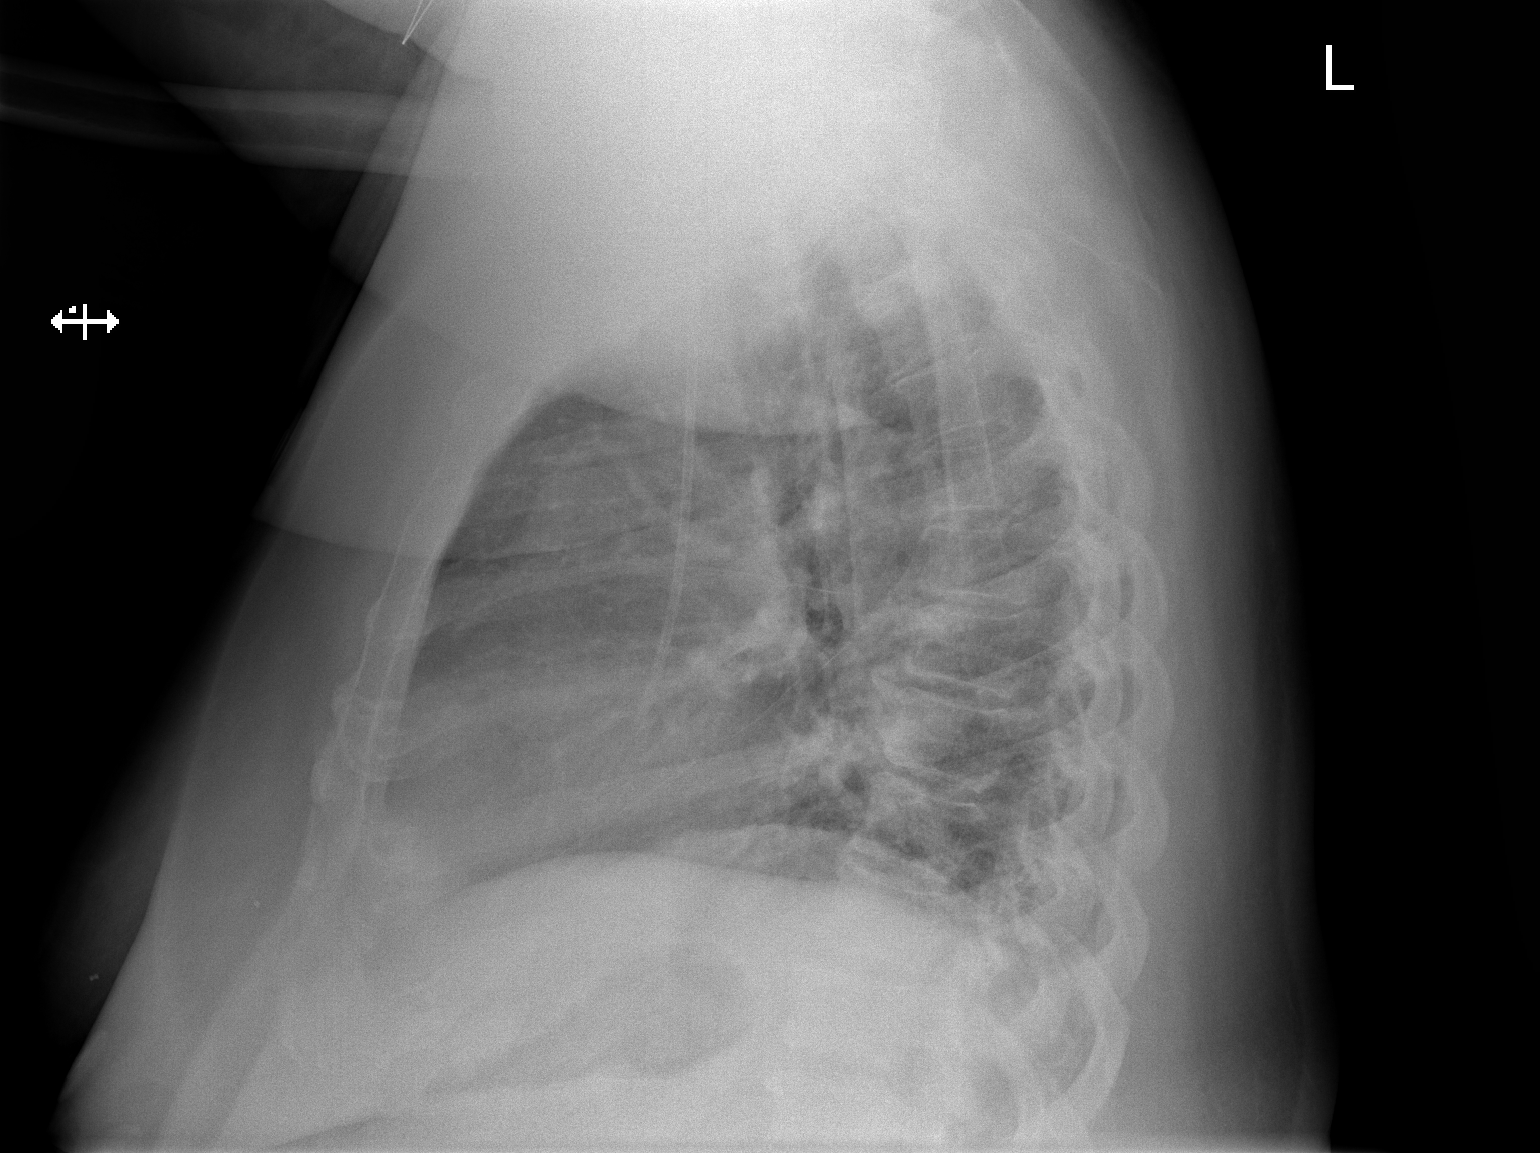

[2 of 2 positions shown; findings below may reference images not displayed]

FINDINGS: Cardiac shadow is stable. Aortic calcifications are again seen.
Right-sided chest wall port is noted in satisfactory position. Lungs
are clear. No bony abnormality is noted.
IMPRESSION: No active cardiopulmonary disease.

## 2020-10-26 MED ORDER — ONDANSETRON HCL 4 MG/2ML IJ SOLN
4.0000 mg | Freq: Once | INTRAMUSCULAR | Status: AC
  Administered 2020-10-26: 4 mg via INTRAVENOUS
  Filled 2020-10-26: qty 2

## 2020-10-26 MED ORDER — ONDANSETRON 4 MG PO TBDP: 4.0000 mg | ORAL_TABLET | Freq: Two times a day (BID) | ORAL | 0 refills | Status: DC | PRN

## 2020-10-26 MED ORDER — IOHEXOL 300 MG/ML  SOLN: 80.0000 mL | Freq: Once | INTRAMUSCULAR | Status: DC | PRN

## 2020-10-26 MED ORDER — HYDROCODONE-ACETAMINOPHEN 5-325 MG PO TABS: 1.0000 | ORAL_TABLET | Freq: Four times a day (QID) | ORAL | 0 refills | Status: DC | PRN

## 2020-10-26 MED ORDER — IOHEXOL 350 MG/ML SOLN
75.0000 mL | Freq: Once | INTRAVENOUS | Status: AC | PRN
  Administered 2020-10-27: 75 mL via INTRAVENOUS

## 2020-10-26 MED ORDER — HYDROMORPHONE HCL 1 MG/ML IJ SOLN
1.0000 mg | Freq: Once | INTRAMUSCULAR | Status: AC
  Administered 2020-10-26: 1 mg via INTRAVENOUS
  Filled 2020-10-26: qty 1

## 2020-10-26 MED ORDER — SODIUM CHLORIDE 0.9 % IV BOLUS
1000.0000 mL | Freq: Once | INTRAVENOUS | Status: AC
  Administered 2020-10-26: 1000 mL via INTRAVENOUS

## 2020-10-26 NOTE — ED Triage Notes (Addendum)
Pt reports chest pain that radiates all over the top of her chest that started on Saturday, but became worse today. Pt reports chest pain became worse today so she went to UC. UC sent her hear due to abnormal EKG. Pt received chemo this past week.

## 2020-10-26 NOTE — ED Provider Notes (Signed)
[facility] DEPT Provider Note   CSN: [id number] Arrival date & time: 10/26/20  1404     History Chief Complaint  Patient presents with  . Chest Pain    Cheryl Blankenship is a 70 y.o. female.  The history is provided by the patient.  Chest Pain Pain location:  R chest and L chest Pain quality: aching   Pain radiates to:  Epigastrium Pain severity:  Moderate Onset quality:  Gradual Timing:  Intermittent Progression:  Waxing and waning Chronicity:  New Context: at rest   Context comment:  Pain on and off for the past week since last chemo for breat cancer treatment. Out of narcotic pain medicine, also with some n/v.  Relieved by:  Nothing Worsened by:  Nothing Associated symptoms: abdominal pain   Associated symptoms: no back pain, no cough, no fever, no palpitations, no shortness of breath and no vomiting   Risk factors comment:  Current chemo for breast cancer      Past Medical History:  Diagnosis Date  . Anxiety   . Breast cancer ([place])   . CHF (congestive heart failure) ([place])   . Diabetes mellitus without complication ([place])    type 2  . Hypertension     Patient Active Problem List   Diagnosis Date Noted  . Exertional dyspnea 08/26/2020  . Essential hypertension 08/26/2020  . Acute diastolic CHF (congestive heart failure) ([place]) 08/26/2020  . Obesity, diabetes, and hypertension syndrome ([place]) 08/26/2020  . Malignant neoplasm of upper-outer quadrant of left breast in female, estrogen receptor positive ([place]) 07/20/2020    Past Surgical History:  Procedure Laterality Date  . APPENDECTOMY    . CHOLECYSTECTOMY    . COLONOSCOPY    . EYE SURGERY     cataracts  . MULTIPLE TOOTH EXTRACTIONS    . PORTACATH PLACEMENT N/A 08/04/2020   Procedure: INSERTION PORT-A-CATH WITH ULTRASOUND GUIDANCE;  Surgeon: [name], MD;  Location: [facility];  Service: General;  Laterality: N/A;  . STOMACH SURGERY       OB History   No obstetric  history on file.     Family History  Problem Relation Age of Onset  . Heart attack Sister   . Diabetes Sister   . Heart attack Mother   . Ovarian cancer Mother 32  . Heart attack Brother   . Cancer Niece        bone cancer   . Cancer Cousin        unknown type cancer   . Cancer Cousin        metastatic cancer to bone     Social History   Tobacco Use  . Smoking status: Former Smoker    Packs/day: 3.00    Years: 30.00    Pack years: 90.00  . Smokeless tobacco: Never Used  Vaping Use  . Vaping Use: Never used  Substance Use Topics  . 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 Drug use: Never    Home Medications Prior to Admission medications   Medication Sig Start Date End Date Taking? Authorizing Provider  Accu-Chek FastClix Lancets MISC  08/14/20   [provider]  acetaminophen (TYLENOL) 500 MG tablet Take 500 mg by mouth every 4 (four) hours as needed for mild pain.    [provider]  albuterol (VENTOLIN HFA) 108 (90 Base) MCG/ACT inhaler Inhale 1-2 puffs into the lungs every 4 (four) hours as needed for  wheezing or shortness of breath. 09/01/20   Pollyann Samples, NP  amLODipine (NORVASC) 10 MG tablet Take 1 tablet (10 mg total) by mouth daily. 10/09/20 09/02/21  Sande Rives, MD  aspirin 81 MG chewable tablet Chew 81 mg by mouth daily.     [provider]  carvedilol (COREG) 25 MG tablet Take 1 tablet (25 mg total) by mouth 2 (two) times daily with a meal. 10/09/20 01/07/21  [name], Ronnald Ramp, MD  Cholecalciferol 25 MCG (1000 UT) capsule Take 1,000 Units by mouth daily. 06/25/20   [provider]  diphenoxylate-atropine (LOMOTIL) 2.5-0.025 MG tablet Take 1-2 tablets by mouth 4 (four) times daily as needed for diarrhea or loose stools. 09/01/20   Pollyann Samples, NP  DULoxetine (CYMBALTA) 30 MG capsule Take 30 mg by mouth daily. 04/21/20   [provider]  empagliflozin  (JARDIANCE) 10 MG TABS tablet Take 1 tablet (10 mg total) by mouth daily before breakfast. 10/09/20   [name], Ronnald Ramp, MD  fluorometholone (FML) 0.1 % ophthalmic suspension Place 1 drop into both eyes daily. 06/09/20   [provider]  folic acid (FOLVITE) 1 MG tablet Take 1 tablet by mouth daily. 07/14/20   [provider]  gabapentin (NEURONTIN) 400 MG capsule Take 400 mg by mouth 3 (three) times daily.  04/21/20   [provider]  glipiZIDE (GLUCOTROL XL) 10 MG 24 hr tablet Take 10 mg by mouth daily with breakfast.  06/24/20   [provider]  HYDROcodone-acetaminophen (NORCO/VICODIN) 5-325 MG tablet Take 1 tablet by mouth every 6 (six) hours as needed for up to 15 doses for moderate pain. 10/26/20   Tarnisha Kachmar, DO  insulin aspart (NOVOLOG) 100 UNIT/ML injection Inject 2-10 Units into the skin 3 (three) times daily before meals. Per sliding scale:  150-199 = 2 units, 200-249 = 4 units, 250-299 = 6 units, 300-349 = 8 units, greater than350 = 10 units.    [provider]  insulin glargine (LANTUS SOLOSTAR) 100 UNIT/ML Solostar Pen Inject 35 Units into the skin 2 (two) times daily. Patient taking differently: Inject 35 Units into the skin 2 (two) times daily. Pt takes 70 units in the morning and 70 units at night. 08/27/20   Roberto Scales D, MD  lidocaine-prilocaine (EMLA) cream APPLY TO AFFECTED AREA 1 time DAILY 09/15/20   Pollyann Samples, NP  losartan (COZAAR) 100 MG tablet Take 1 tablet (100 mg total) by mouth daily. 10/09/20   Sande Rives, MD  metFORMIN (GLUCOPHAGE) 500 MG tablet Take 500 mg by mouth 2 (two) times daily with a meal.  06/25/20   [provider]  Nutritional Supplements (FEEDING SUPPLEMENT, GLUCERNA 1.2 CAL,) LIQD Take 237 mLs by mouth 3 (three) times daily with meals.    [provider]  Omega-3 Fatty Acids (FISH OIL) 1000 MG CAPS Take 1,000 mg by mouth daily.    [provider]  omeprazole  (PRILOSEC) 20 MG capsule Take 1 capsule (20 mg total) by mouth daily. 10/09/20   Sande Rives, MD  ondansetron (ZOFRAN-ODT) 4 MG disintegrating tablet Take 1 tablet (4 mg total) by mouth 2 (two) times daily as needed for up to 30 doses for nausea or vomiting. 10/26/20   Jacari Kirsten, DO  potassium chloride (KLOR-CON) 10 MEQ tablet Take 20 mEq by mouth daily. DAILY WITH LASIX    [provider]  prochlorperazine (COMPAZINE) 10 MG tablet Take 1 tablet (10 mg total) by mouth every 6 (six)  hours as needed for nausea or vomiting. 10/19/20   [name], MD  RESTASIS 0.05 % ophthalmic emulsion Place 1 drop into both eyes 2 (two) times daily. 07/13/20   [provider]  rosuvastatin (CRESTOR) 5 MG tablet Take 5 mg by mouth at bedtime. 06/25/20   [provider]  spironolactone (ALDACTONE) 25 MG tablet Take 1 tablet (25 mg total) by mouth daily. 10/09/20   O'NealCassie Freer, MD  tiZANidine (ZANAFLEX) 4 MG tablet Take 4 mg by mouth 2 (two) times daily as needed for muscle spasms.  06/23/20   [provider]  torsemide (DEMADEX) 20 MG tablet Take 2 tablets (40 mg total) by mouth daily. 10/09/20 01/07/21  O'NealCassie Freer, MD  vitamin E 180 MG (400 UNITS) capsule Take 400 Units by mouth daily.    [provider]    Allergies    Coconut (cocos nucifera) allergy skin test, Hydrochlorothiazide, Lisinopril, and Coconut oil  Review of Systems   Review of Systems  Constitutional: Negative for chills and fever.  HENT: Negative for ear pain and sore throat.   Eyes: Negative for pain and visual disturbance.  Respiratory: Negative for cough and shortness of breath.   Cardiovascular: Positive for chest pain. Negative for palpitations.  Gastrointestinal: Positive for abdominal pain. Negative for vomiting.  Genitourinary: Negative for dysuria and hematuria.  Musculoskeletal: Positive for arthralgias. Negative for back pain.  Skin: Negative for color change and  rash.  Neurological: Negative for seizures and syncope.  All other systems reviewed and are negative.   Physical Exam Updated Vital Signs BP (!) 186/95 (BP Location: Left Arm)   Pulse 71   Temp 98.7 F (37.1 C) (Oral)   Resp 15   SpO2 98%   Physical Exam Vitals and nursing note reviewed.  Constitutional:      General: She is not in acute distress.    Appearance: She is well-developed and well-nourished. She is obese. She is ill-appearing.  HENT:     Head: Normocephalic and atraumatic.  Eyes:     Conjunctiva/sclera: Conjunctivae normal.     Pupils: Pupils are equal, round, and reactive to light.  Cardiovascular:     Rate and Rhythm: Normal rate and regular rhythm.     Pulses:          Radial pulses are 2+ on the right side and 2+ on the left side.     Heart sounds: No murmur heard.   Pulmonary:     Effort: Pulmonary effort is normal. No respiratory distress.     Breath sounds: Normal breath sounds. No decreased breath sounds or wheezing.  Chest:     Chest wall: Tenderness (TTP to anterior chest wall) present.  Abdominal:     Palpations: Abdomen is soft.     Tenderness: There is no abdominal tenderness.  Musculoskeletal:        General: No edema.     Cervical back: Neck supple.     Right lower leg: No edema.     Left lower leg: No edema.  Skin:    General: Skin is warm and dry.     Capillary Refill: Capillary refill takes less than 2 seconds.  Neurological:     General: No focal deficit present.     Mental Status: She is alert.  Psychiatric:        Mood and Affect: Mood and affect normal.     ED Results / Procedures / Treatments   Labs (all labs ordered are  listed, but only abnormal results are displayed) Labs Reviewed  BASIC METABOLIC PANEL - Abnormal; Notable for the following components:      Result Value   Glucose, Bld 241 (*)    Creatinine, Ser 1.33 (*)    Calcium 10.6 (*)    GFR, Estimated 44 (*)    All other components within normal limits  CBC -  Abnormal; Notable for the following components:   Platelets 104 (*)    All other components within normal limits  D-DIMER, QUANTITATIVE (NOT AT [facility]) - Abnormal; Notable for the following components:   D-Dimer, Quant 2.18 (*)    All other components within normal limits  HEPATIC FUNCTION PANEL  LIPASE, BLOOD  TROPONIN I (HIGH SENSITIVITY)  TROPONIN I (HIGH SENSITIVITY)    EKG EKG Interpretation  Date/Time:  Monday October 26 2020 14:21:10 EST Ventricular Rate:  77 PR Interval:    QRS Duration: 122 QT Interval:  398 QTC Calculation: 451 R Axis:   -55 Text Interpretation: Sinus rhythm Left bundle branch block No significant change since last tracing Confirmed by Lennice Sites [phone]) on 10/26/2020 9:36:07 PM   Radiology DG Chest 2 View  Result Date: 10/26/2020 CLINICAL DATA:  Chest pain EXAM: CHEST - 2 VIEW COMPARISON:  08/25/2020 FINDINGS: Cardiac shadow is stable. Aortic calcifications are again seen. Right-sided chest wall port is noted in satisfactory position. Lungs are clear. No bony abnormality is noted. IMPRESSION: No active cardiopulmonary disease. Electronically Signed   By: Inez Catalina M.D.   On: 10/26/2020 15:13    Procedures Procedures   Medications Ordered in ED Medications  sodium chloride 0.9 % bolus 1,000 mL (1,000 mLs Intravenous New Bag/Given 10/26/20 2236)  HYDROmorphone (DILAUDID) injection 1 mg (1 mg Intravenous Given 10/26/20 2237)  ondansetron (ZOFRAN) injection 4 mg (4 mg Intravenous Given 10/26/20 2236)  iohexol (OMNIPAQUE) 350 MG/ML injection 75 mL (75 mLs Intravenous Contrast Given 10/27/20 0004)    ED Course  I have reviewed the triage vital signs and the nursing notes.  Pertinent labs & imaging results that were available during my care of the patient were reviewed by me and considered in my medical decision making (see chart for details).    MDM Rules/Calculators/A&P                          Cheryl Blankenship is a 70 year old female with history  of breast cancer currently on chemotherapy, diabetes who presents to the ED with chest pain, upper abdominal pain.  Overall she has diffuse pain and body aches since last chemotherapy last week.  No longer has narcotic pain medicine.  She is has some nausea but does not have Zofran at home.  EKG shows sinus rhythm.  No ischemic changes.  Unchanged from prior EKG.  Initial troponin within normal limits.  Appears to be musculoskeletal told bony type pain on exam but will get a D-dimer given her history of cancer.  Chest x-ray showed no signs of pneumonia, no pneumothorax.  No significant anemia, electrolyte abnormality, kidney injury otherwise.  Will give IV fluids, IV Dilaudid, IV Zofran.  We will add hepatic function panel, lipase, repeat troponin and check D-dimer.  D-dimer elevated.  We will get a PE scan.  Patient feeling much better after IV fluids, IV pain medicine and nausea medicine.    Repeat troponin is 17.  While this is a slightly increased from earlier troponin still have a low suspicion for ACS.  Fairly  reproducible chest pain.  Improved with Dilaudid.  CT scan showed no PE, no infectious findings.  Shared decision was made with the patient to have her return to ED if symptoms worsen or became more exertional with diaphoresis.  She will call cardiology follow-up with them closely to see if they would recommend any further ischemic work-up.  Hospitalist was consulted and evaluated the patient as well and they had a lower suspicion for ACS as well and after work-up was complete shared decision was made with the patient to discharge home with pain medicine and nausea medicine.  She understands return precautions.  This chart was dictated using voice recognition software.  Despite best efforts to proofread,  errors can occur which can change the documentation meaning.   Final Clinical Impression(s) / ED Diagnoses Final diagnoses:  Nausea  Chest pain, unspecified type    Rx / DC Orders ED  Discharge Orders         Ordered    HYDROcodone-acetaminophen (NORCO/VICODIN) 5-325 MG tablet  Every 6 hours PRN        10/26/20 2253    ondansetron (ZOFRAN-ODT) 4 MG disintegrating tablet  2 times daily PRN        10/26/20 2253           Lennice Sites, DO 10/26/20 2354    Lennice Sites, DO 10/27/20 0008    Ronnald Nian, Ysabelle Goodroe, DO 10/27/20 0036    Lennice Sites, DO 10/27/20 1136

## 2020-10-26 NOTE — Progress Notes (Incomplete)
[place]   Telephone:(336) [phone] Fax:(336) [phone]   Clinic Follow up Note   Patient Care Team: [name], PA-C as PCP - General (Internal Medicine) [name], RN as Oncology Nurse Navigator [name], RN as Oncology Nurse Navigator [name], MD as Consulting Physician (General Surgery) [name], MD as Consulting Physician (Hematology) [name], MD as Consulting Physician ([place]) [name], [name], MD as Consulting Physician (Cardiology)   I connected with Cheryl Blankenship on 10/26/2020 at  1:40 PM EST by {Blank single:[id number]::"video enabled telemedicine visit","telephone visit"} and verified that I am speaking with the correct person using two identifiers.  I discussed the limitations, risks, security and privacy concerns of performing an evaluation and management service by telephone and the availability of in person appointments. I also discussed with the patient that there may be a patient responsible charge related to this service. The patient expressed understanding and agreed to proceed.   Other persons participating in the visit and their role in the encounter:  ***  Patient's location:  *** Provider's location:  ***  CHIEF COMPLAINT: F/u of left breast cancer  SUMMARY OF ONCOLOGIC HISTORY: Oncology History Overview Note  Cancer Staging Malignant neoplasm of upper-outer quadrant of left breast in female, estrogen receptor positive ([place]) Staging form: Breast, AJCC 8th Edition - Clinical stage from 07/16/2020: Stage IB (cT2, cN1, cM0, G2, ER+, PR+, HER2+) - Signed by [name], MD on 07/21/2020    Malignant neoplasm of upper-outer quadrant of left breast in female, estrogen receptor positive ([name])  07/01/2020 Mammogram   IMPRESSION: 1. There is a 12 cm span of highly suspicious calcifications involving the upper outer and upper inner quadrants of the left breast. On ultrasound, within this area of  calcifications, there are confluent masses at 1 o'clock spanning at least 4.5 cm. An additional suspicious mass is seen in the left breast at 2 o'clock.  -Ultrasound targeted to the left breast at 1 o'clock, 5 cm from the nipple demonstrates a broad area of hypoechoic irregular tissue/confluent masses spanning at least 4.5 cm. There is a separate oval hypoechoic mass with indistinct margins at 2 o'clock, 7 cm from the nipple measuring 0.9 x 0.4 x 0.9 cm. Ultrasound of the left axilla demonstrates 2 abnormally thickened lymph nodes with cortices measuring 6-7 mm. There is an additional lymph node with a borderline cortex of 4 mm.   2. There are 2 lymph nodes with thickened cortices in the left axilla, and an additional lymph node with a borderline thickened cortex.   07/16/2020 Cancer Staging   Staging form: Breast, AJCC 8th Edition - Clinical stage from 07/16/2020: Stage IIA (cT3, cN1, cM0, G2, ER+, PR+, HER2+) - Signed by [name], MD on 08/05/2020   07/16/2020 Initial Biopsy   Diagnosis 1. 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 Breast, left, needle core biopsy, 1 o'clock - INVASIVE DUCTAL CARCINOMA - SEE COMMENT 3. Lymph node, biopsy - METASTATIC CARCINOMA INVOLVING A LYMPH NODE - SEE COMMENT Microscopic Comment 1. Based on the biopsy, the ductal carcinoma in situ has a comedo pattern, high nuclear grade and measures 0.7 cm in greatest linear extent. Prognostic markers (ER/PR) are pending and will be reported in an addendum. 2. Based on the biopsy, the carcinoma appears Nottingham grade 2 of 3 and measures 1.5 cm in greatest linear extent. Prognostic markers (ER/PR/ki-67/HER2) are pending and  will be reported in an addendum. Dr. Saralyn Pilar reviewed the case and agrees with the above diagnosis. These results were called to [facility] on July 17, 2020. 3. Based on the biopsy, the carcinoma appears measures 1.0 cm in greatest linear extent. Prognostic markers (ER/PR/ki-67/HER2) are pending and will be reported in an addendum.   07/16/2020 Receptors her2   1. PROGNOSTIC INDICATORS Results: IMMUNOHISTOCHEMICAL AND MORPHOMETRIC ANALYSIS PERFORMED MANUALLY Estrogen Receptor: 30%, POSITIVE, STRONG STAINING INTENSITY Progesterone Receptor: 5%, POSITIVE, STRONG STAINING INTENSITY  2. PROGNOSTIC INDICATORS Results: IMMUNOHISTOCHEMICAL AND MORPHOMETRIC ANALYSIS PERFORMED MANUALLY The tumor cells are POSITIVE for Her2 (3+). Estrogen Receptor: 85%, POSITIVE, STRONG STAINING INTENSITY Progesterone Receptor: 60%, POSITIVE, STRONG STAINING INTENSITY Proliferation Marker Ki67: 25%  3. PROGNOSTIC INDICATORS Results: IMMUNOHISTOCHEMICAL AND MORPHOMETRIC ANALYSIS PERFORMED MANUALLY The tumor cells are POSITIVE for Her2 (3+). Estrogen Receptor: 95%, POSITIVE, STRONG STAINING INTENSITY Progesterone Receptor: 45%, POSITIVE, STRONG STAINING INTENSITY Proliferation Marker Ki67: 30%   07/20/2020 Initial Diagnosis   Malignant neoplasm of upper-outer quadrant of left breast in female, estrogen receptor positive ([name])   07/31/2020 Echocardiogram   Baseline Echo  IMPRESSIONS     1. Left ventricular ejection fraction, by estimation, is 65 to 70%. The  left ventricle has normal function. The left ventricle has no regional  wall motion abnormalities. Left ventricular diastolic parameters are  indeterminate. The average left  ventricular global longitudinal strain is -17.1 %.   2. Right ventricular systolic function is normal. The right ventricular  size is normal. There is normal pulmonary artery systolic pressure.   3. The mitral valve is normal in structure. Trivial mitral valve  regurgitation. No evidence of mitral stenosis.   4. The aortic valve is normal in structure. Aortic valve regurgitation is  not visualized. No aortic stenosis is  present.    07/31/2020 Breast MRI   IMPRESSION: 1. Extensive non mass enhancement within the left breast extending from the nipple to the chest wall. Overall findings are most compatible with extensive malignancy involving the majority of the left breast. 2. Indeterminate enhancing mass within the upper inner right breast. 3. Three morphologically abnormal lymph nodes within the left axilla. One of these nodes has been biopsied compatible with metastatic adenopathy.     08/03/2020 Imaging   Bone Scan whole body  IMPRESSION: Increased radiotracer uptake in the mid to lower lumbar regions of uncertain etiology. Correlation with radiography to assess for potential arthropathy in these areas advised. Increased uptake in major joints is likely of arthropathic etiology. Increased uptake in the mid face is likely due to paranasal sinus disease. Distribution of radiotracer uptake in bony structures elsewhere unremarkable.   08/04/2020 Procedure   PAC placement by Dr [name]   08/05/2020 -  Neo-Adjuvant Anti-estrogen oral therapy   Neoadjuvant THP q3weeks for 4-6 cycles starting 08/05/20, followed by Herceptin or Kadcyla q3weeks to complete 1 year of treatment. Stopped THP after 1 dose due to poor toleration       -I switched her to [place] starting 08/25/20.   08/07/2020 Imaging   CT CAP  IMPRESSION: 2.7 cm mass in the upper outer left breast, likely corresponding to the patient's known primary breast neoplasm.   8 mm short axis left axillary node, suspicious for nodal metastasis in this clinical context.   No findings suspicious for distant metastasis.   Endometrium is mildly prominent, measuring 11 mm. Correlate for vaginal bleeding and consider pelvic ultrasound for further evaluation if clinically warranted.   08/11/2020 Pathology Results  Diagnosis Breast, right, needle core biopsy, right breast - PAPILLARY LESION - SEE COMMENT Microscopic Comment The biopsy  consists of a papillary lesion with a focal area of adenosis. Immunohistochemistry (SMM, calponin and p63) shows retention of the myoepithelial cell layer in the focus of adenosis. 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 These results were called to [facility] on August 12, 2020.      CURRENT THERAPY:  Neo-adjuvant Kadcyla q3weeks starting 08/25/20  INTERVAL HISTORY: *** Xandria Gallaga is here for a follow up. {They identified themselves by birth date/face to face video.}    REVIEW OF SYSTEMS:  *** Constitutional: Denies fevers, chills or abnormal weight loss Eyes: Denies blurriness of vision Ears, nose, mouth, throat, and face: Denies mucositis or sore throat Respiratory: Denies cough, dyspnea or wheezes Cardiovascular: Denies palpitation, chest discomfort or lower extremity swelling Gastrointestinal:  Denies nausea, heartburn or change in bowel habits Skin: Denies abnormal skin rashes Lymphatics: Denies new lymphadenopathy or easy bruising Neurological:Denies numbness, tingling or new weaknesses Behavioral/Psych: Mood is stable, no new changes  All other systems were reviewed with the patient and are negative.  MEDICAL HISTORY:  Past Medical History:  Diagnosis Date  . Anxiety   . Breast cancer ([place])   . CHF (congestive heart failure) ([place])   . Diabetes mellitus without complication ([place])    type 2  . Hypertension     SURGICAL HISTORY: Past Surgical History:  Procedure Laterality Date  . APPENDECTOMY    . CHOLECYSTECTOMY    . COLONOSCOPY    . EYE SURGERY     cataracts  . MULTIPLE TOOTH EXTRACTIONS    . PORTACATH PLACEMENT N/A 08/04/2020   Procedure: INSERTION PORT-A-CATH WITH ULTRASOUND GUIDANCE;  Surgeon: [name], MD;  Location: [facility];  Service: General;  Laterality: N/A;  . STOMACH SURGERY      I have reviewed the social history and family history with the patient and they are unchanged from previous  note.  ALLERGIES:  is allergic to coconut (cocos nucifera) allergy skin test, hydrochlorothiazide, lisinopril, and coconut oil.  MEDICATIONS:  Current Outpatient Medications  Medication Sig Dispense Refill  . Accu-Chek FastClix Lancets MISC     . acetaminophen (TYLENOL) 500 MG tablet Take 500 mg by mouth every 4 (four) hours as needed for mild pain.    Marland Kitchen albuterol (VENTOLIN HFA) 108 (90 Base) MCG/ACT inhaler Inhale 1-2 puffs into the lungs every 4 (four) hours as needed for wheezing or shortness of breath. 8 g 0  . amLODipine (NORVASC) 10 MG tablet Take 1 tablet (10 mg total) by mouth daily. 30 tablet 10  . aspirin 81 MG chewable tablet Chew 81 mg by mouth daily.     . carvedilol (COREG) 25 MG tablet Take 1 tablet (25 mg total) by mouth 2 (two) times daily with a meal. 180 tablet 3  . Cholecalciferol 25 MCG (1000 UT) capsule Take 1,000 Units by mouth daily.    . diphenoxylate-atropine (LOMOTIL) 2.5-0.025 MG tablet Take 1-2 tablets by mouth 4 (four) times daily as needed for diarrhea or loose stools. 60 tablet 2  . DULoxetine (CYMBALTA) 30 MG capsule Take 30 mg by mouth daily.    . empagliflozin (JARDIANCE) 10 MG TABS tablet Take 1 tablet (10 mg total) by mouth daily before breakfast. 90 tablet 1  . fluorometholone (FML) 0.1 % ophthalmic suspension Place 1 drop into both eyes daily.    . folic acid (FOLVITE) 1 MG tablet  Take 1 tablet by mouth daily.    Marland Kitchen gabapentin (NEURONTIN) 400 MG capsule Take 400 mg by mouth 3 (three) times daily.     Marland Kitchen glipiZIDE (GLUCOTROL XL) 10 MG 24 hr tablet Take 10 mg by mouth daily with breakfast.     . HYDROcodone-acetaminophen (NORCO/VICODIN) 5-325 MG tablet Take 1 tablet by mouth every 6 (six) hours as needed for moderate pain. 15 tablet 0  . insulin aspart (NOVOLOG) 100 UNIT/ML injection Inject 2-10 Units into the skin 3 (three) times daily before meals. Per sliding scale:  150-199 = 2 units, 200-249 = 4 units, 250-299 = 6 units, 300-349 = 8 units, greater than350  = 10 units.    . insulin glargine (LANTUS SOLOSTAR) 100 UNIT/ML Solostar Pen Inject 35 Units into the skin 2 (two) times daily. (Patient taking differently: Inject 35 Units into the skin 2 (two) times daily. Pt takes 70 units in the morning and 70 units at night.) 15 mL 11  . lidocaine-prilocaine (EMLA) cream APPLY TO AFFECTED AREA 1 time DAILY 30 g 3  . losartan (COZAAR) 100 MG tablet Take 1 tablet (100 mg total) by mouth daily. 90 tablet 1  . metFORMIN (GLUCOPHAGE) 500 MG tablet Take 500 mg by mouth 2 (two) times daily with a meal.     . Nutritional Supplements (FEEDING SUPPLEMENT, GLUCERNA 1.2 CAL,) LIQD Take 237 mLs by mouth 3 (three) times daily with meals.    . Omega-3 Fatty Acids (FISH OIL) 1000 MG CAPS Take 1,000 mg by mouth daily.    Marland Kitchen omeprazole (PRILOSEC) 20 MG capsule Take 1 capsule (20 mg total) by mouth daily. 30 capsule 11  . ondansetron (ZOFRAN-ODT) 4 MG disintegrating tablet Take 1 tablet (4 mg total) by mouth 2 (two) times daily as needed for nausea or vomiting. 30 tablet 2  . oxyCODONE (OXY IR/ROXICODONE) 5 MG immediate release tablet Take 1-2 tablets (5-10 mg total) by mouth every 8 (eight) hours as needed for severe pain. 15 tablet 0  . potassium chloride (KLOR-CON) 10 MEQ tablet Take 20 mEq by mouth daily. DAILY WITH LASIX    . prochlorperazine (COMPAZINE) 10 MG tablet Take 1 tablet (10 mg total) by mouth every 6 (six) hours as needed for nausea or vomiting. 30 tablet 1  . RESTASIS 0.05 % ophthalmic emulsion Place 1 drop into both eyes 2 (two) times daily.    . rosuvastatin (CRESTOR) 5 MG tablet Take 5 mg by mouth at bedtime.    Marland Kitchen spironolactone (ALDACTONE) 25 MG tablet Take 1 tablet (25 mg total) by mouth daily. 90 tablet 1  . tiZANidine (ZANAFLEX) 4 MG tablet Take 4 mg by mouth 2 (two) times daily as needed for muscle spasms.     Marland Kitchen torsemide (DEMADEX) 20 MG tablet Take 2 tablets (40 mg total) by mouth daily. 180 tablet 3  . vitamin E 180 MG (400 UNITS) capsule Take 400 Units  by mouth daily.     No current facility-administered medications for this visit.    PHYSICAL EXAMINATION: ECOG PERFORMANCE STATUS: {CHL ONC ECOG [phone]}  No vitals taken today, Exam not performed today   LABORATORY DATA:  I have reviewed the data as listed CBC Latest Ref Rng & Units 10/26/2020 10/19/2020 09/01/2020  WBC 4.0 - 10.5 K/uL 6.9 7.3 8.9  Hemoglobin 12.0 - 15.0 g/dL 12.9 12.1 10.3(L)  Hematocrit 36.0 - 46.0 % 40.1 38.5 32.3(L)  Platelets 150 - 400 K/uL 104(L) 238 277     CMP Latest Ref Rng &  Units 10/26/2020 10/19/2020 09/01/2020  Glucose 70 - 99 mg/dL 241(H) 223(H) 293(H)  BUN 8 - 23 mg/dL 21 29(H) 14  Creatinine 0.44 - 1.00 mg/dL 1.33(H) 1.35(H) 1.02(H)  Sodium 135 - 145 mmol/L 141 138 140  Potassium 3.5 - 5.1 mmol/L 3.6 4.1 3.8  Chloride 98 - 111 mmol/L 100 103 108  CO2 22 - 32 mmol/L 26 29 30   Calcium 8.9 - 10.3 mg/dL 10.6(H) 10.4(H) 10.1  Total Protein 6.5 - 8.1 g/dL - 7.0 6.9  Total Bilirubin 0.3 - 1.2 mg/dL - 0.4 0.5  Alkaline Phos 38 - 126 U/L - 63 67  AST 15 - 41 U/L - 13(L) 11(L)  ALT 0 - 44 U/L - 15 17      RADIOGRAPHIC STUDIES: I have personally reviewed the radiological images as listed and agreed with the findings in the report. DG Chest 2 View  Result Date: 10/26/2020 CLINICAL DATA:  Chest pain EXAM: CHEST - 2 VIEW COMPARISON:  08/25/2020 FINDINGS: Cardiac shadow is stable. Aortic calcifications are again seen. Right-sided chest wall port is noted in satisfactory position. Lungs are clear. No bony abnormality is noted. IMPRESSION: No active cardiopulmonary disease. Electronically Signed   By: Inez Catalina M.D.   On: 10/26/2020 15:13     ASSESSMENT & PLAN:  Cheryl Blankenship is a 70 y.o. female with   1.Malignant neoplasm of upper-outer quadrant of left breast, StageII, c(T2N1M0), ER+/PR+/HER2+, GradeII -She was diagnosed in 06/2020.She has 12cm calcification and 4.5cm mass in her left breast with2 abnormalLN. Her biopsy showed her  calcifications are DCIS and her 4.5cm mass is invasive ductal carcinoma with LN involvement, ER/PR/HER2 positive. -HerMRI Breast from 07/31/20 shows very extensive disease in her left breast and 3 abnormal LNs in the left axilla. There isindeterminate enhancing43mmass within the upper inner right breast. -Her 08/11/20 right breast biopsy showed benign papillary lesion. -Herbone scan from 08/03/20 shows uptake in her lumbar spine, but nonspecific. She has known lumbar back pain from arthritis. Her CT CAP from 08/07/20 showed known breast masses and left axillary LN andnodistant metastasis. She has mild thickening of endometrium, pt denies vaginal bleeding. -Given theextensivedisease in her left breast,shewillneedmastectomy with Dr TCindra Evesthe only way to cure her cancer. -She startedneoadjuvantchemoTHPq3weekson11/17/21. Due to poor toleration after C1, she was switched to KRyder Systemon 09/04/20. Plan for 5-6 cycles in total. C2 postponed due to COVID (+) in early 09/2020.  -After 1 dose THP and 1 Cycle Kadcyla her left breast mass is no longer palpable on exam. Her left axillary LN is still palpable. This indicates good clinical response to treatment. ***   2. Symptoms Management: General body pain, Diarrhea, N&V  -With first week on THP she hadgeneral body pain, mainly in legs, shoulders and neck, diarrhea, N&V, Fatigue.  -For pain Norco was not enough so I started her on Oxycodoneup to 2 tablets q8hours (08/11/20). -Her THP was changed to Kadcyla on 09/04/20.  -She did have mild intermittent nausea and diarrhea with first cycle Kadcyla, but more manageable than on THP. Will continue Zofran and compazine for nausea and imodium and lomotil for her diarrhea.  -She continues to have upper leg pain with cramping. She notes she is taking her potassium. I recommend she increase her Cymbalta.  -I recommend she drink 30-40 ounces of liquid a day.   3. Comorbidities:  CHF,poorly controlledDM with neuropathy, HTN, Arthritis in hips, Elevated Cr  -Due to Nausea and stomach pain fromJardiance, she takes Zofran as needed.  -Continue  medications and f/u with PCP and Cardiologist Dr. Marisue Ivan.  -She had 2 falls in 2021due to multiple medications and poor sleep. She ambulates with cane.  -Pt is on Lasix which she notes leads to urinary incontinence. She wears depends for this. -Her baseline ECHO from 07/31/20 was normal. Will monitor on Herceptin.  -She has had increased LE edema from her recent CHF exacerbation. She will continue to f/u with Cardiologist.  -She had a syncope episode in [place] the last week of 09/2020. She took her insulin without eating. She is currently on 70 units of insulin and sliding scale insulin. I advised her to eat regularly with her insulin. She will continue to f/u with her PCP and our dietician.   4. Genetic testing  -Given her mild family history of cancer (mother with Ovarian, niece and 2 cousins with cancer) she is eligible for genetic testing. She is agreeable to proceed.   5. Social Support -She does have decreased memory, she is on medication. Her family help her with history. -She lives in [place] alone in an apartmentwith other older women. Sheis currently able to take care of herself independently. Her daughter is in town and her son is in [place]. She has other non-biological children.  -She plan to have care given at home soon.She has Medicare and Medicaid.  6. Leg edema, L>R -Developed since last week, likely related to IVF and low protein level -will getDoppler showed out DVT  7. COVID (+) in early 09/2020  -This was contracted after family gathering. She was asymptomatic and did not require treatment. She tested negative in later 09/2020.  -She has recovered well   PLAN: *** -Lab, flush, F/u and Kadcyla in 3, 6, 9 weeks.     No problem-specific Assessment & Plan notes found for this  encounter.   No orders of the defined types were placed in this encounter.  I discussed the assessment and treatment plan with the patient. The patient was provided an opportunity to ask questions and all were answered. The patient agreed with the plan and demonstrated an understanding of the instructions.  The patient was advised to call back or seek an in-person evaluation if the symptoms worsen or if the condition fails to improve as anticipated.  The total time spent in the appointment was {CHL ONC TIME VISIT - HDQQI:[id number]}.    Joslyn Devon 10/26/2020   Oneal Deputy, am acting as scribe for [name], MD.   {Add scribe attestation statement}

## 2020-10-26 NOTE — Telephone Encounter (Signed)
Cheryl Blankenship's daughter Cheryl Blankenship left vm stating that her mom was having pain under her arm and on top of her breast.  When I returned the call she had just left urgent care were she was directed to the ED.  She had an EKG done that showed irregularities.

## 2020-10-27 ENCOUNTER — Emergency Department (HOSPITAL_COMMUNITY): Payer: Medicare HMO

## 2020-10-27 DIAGNOSIS — R110 Nausea: Secondary | ICD-10-CM | POA: Diagnosis not present

## 2020-10-27 DIAGNOSIS — R0789 Other chest pain: Secondary | ICD-10-CM

## 2020-10-27 IMAGING — CT CT ANGIO CHEST
2 of 6 series · 18 of 36 positions shown · IV contrast (omnipaque)
Comparison: None.

CLINICAL DATA: Chest pain and positive D-dimer

EXAM:
CT ANGIOGRAPHY CHEST WITH CONTRAST
TECHNIQUE: Multidetector CT imaging of the chest was performed using the
standard protocol during bolus administration of intravenous
contrast. Multiplanar CT image reconstructions and MIPs were
obtained to evaluate the vascular anatomy.
CONTRAST:  75mL OMNIPAQUE IOHEXOL 350 MG/ML SOLN

[Series 5: thins · axial · 0.68mm/px · z∈[+1391,+1636]mm · 17 of 277 slices shown]
[im 16/277  lung]
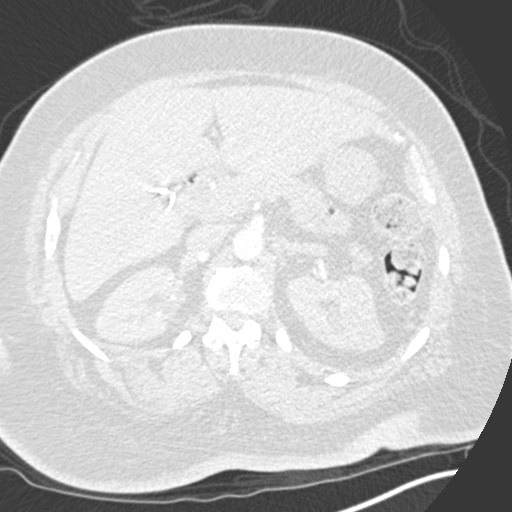
[im 31/277  mediastinal]
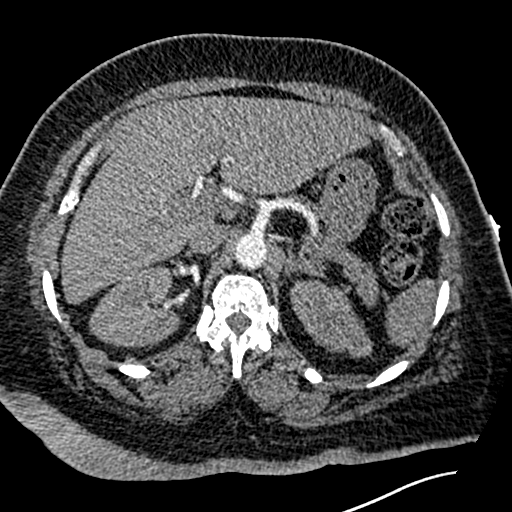
[im 47/277  lung]
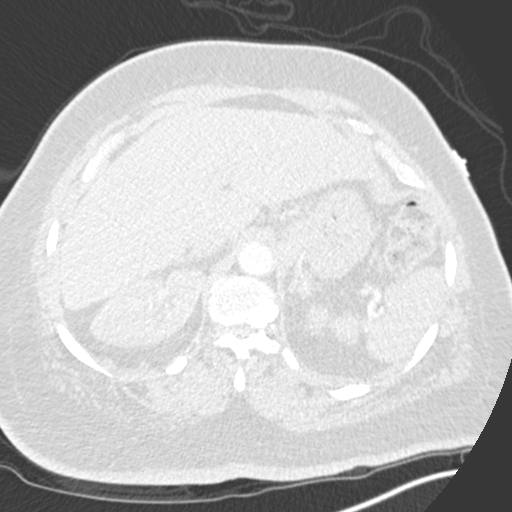
[im 62/277  mediastinal]
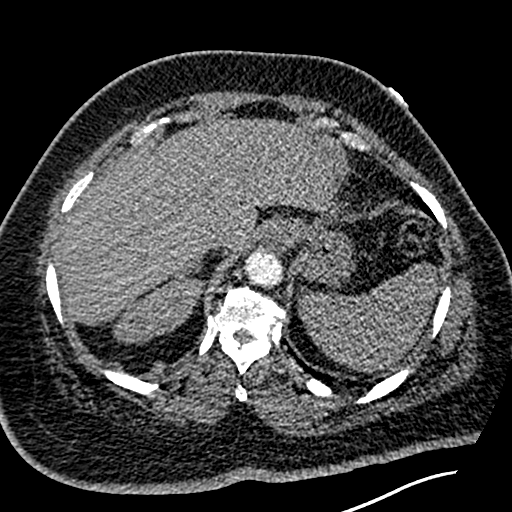
[im 77/277  lung]
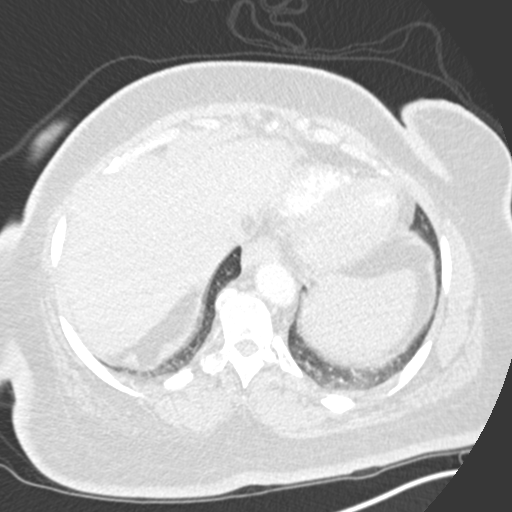
[im 93/277  mediastinal]
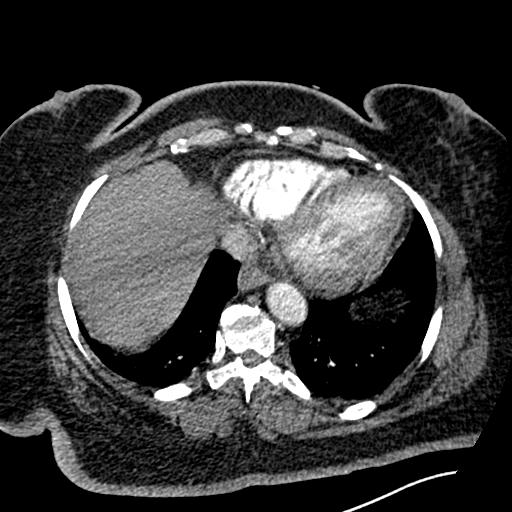
[im 108/277  lung]
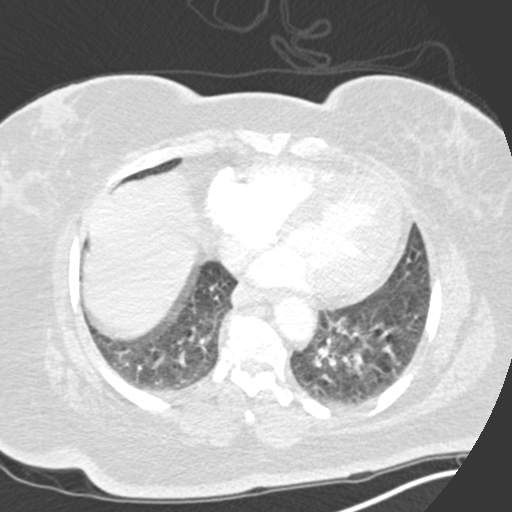
[im 123/277  mediastinal]
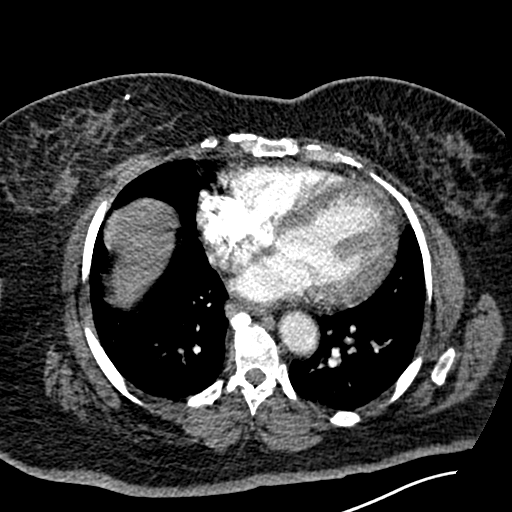
[im 139/277  lung]
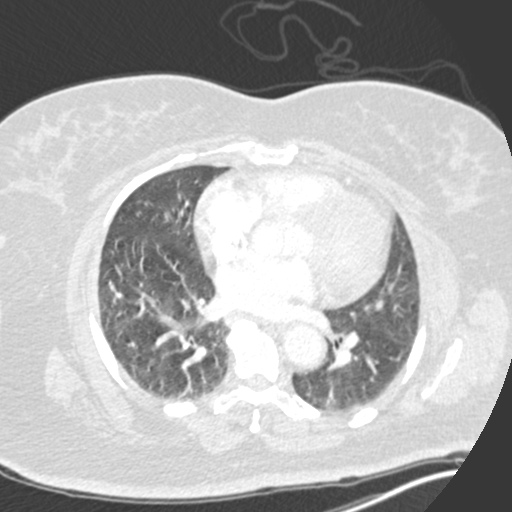
[im 154/277  mediastinal]
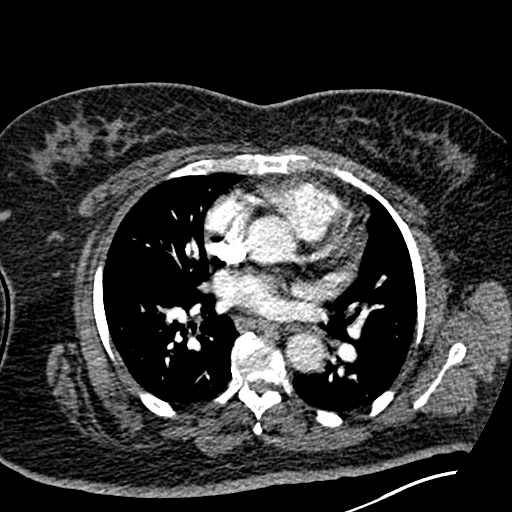
[im 169/277  lung]
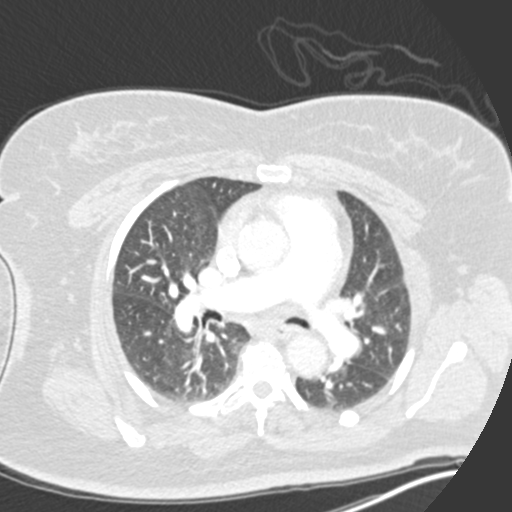
[im 185/277  mediastinal]
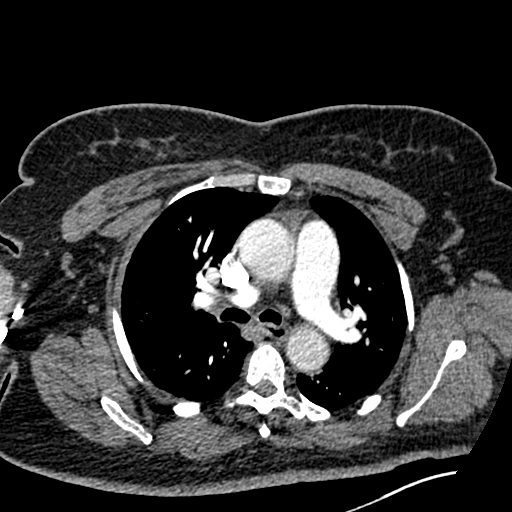
[im 200/277  lung]
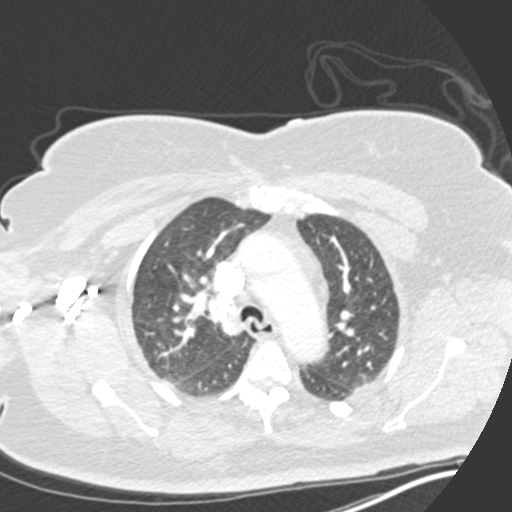
[im 215/277  mediastinal]
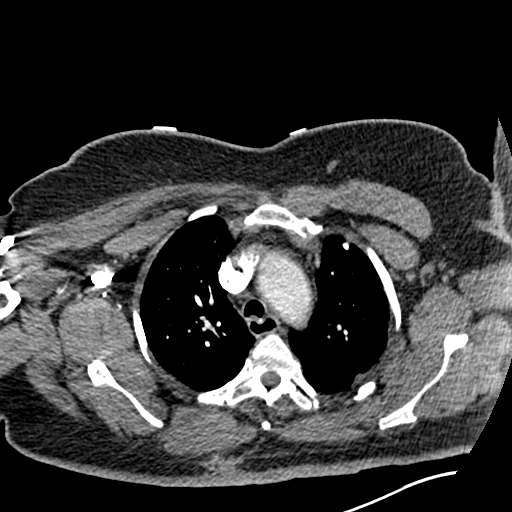
[im 231/277  lung]
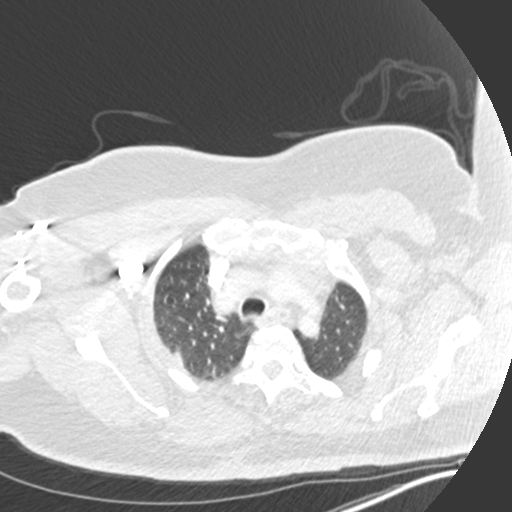
[im 246/277  mediastinal]
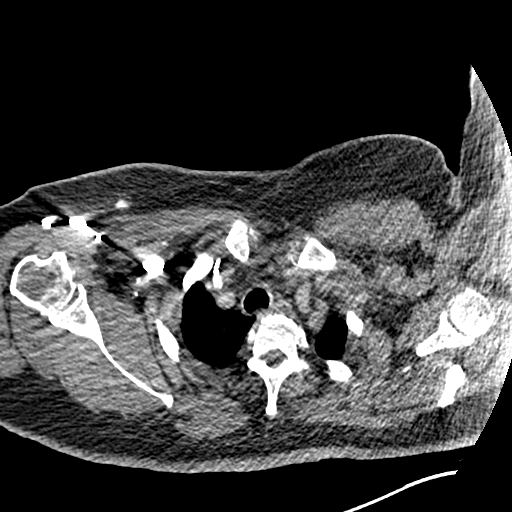
[im 261/277  lung]
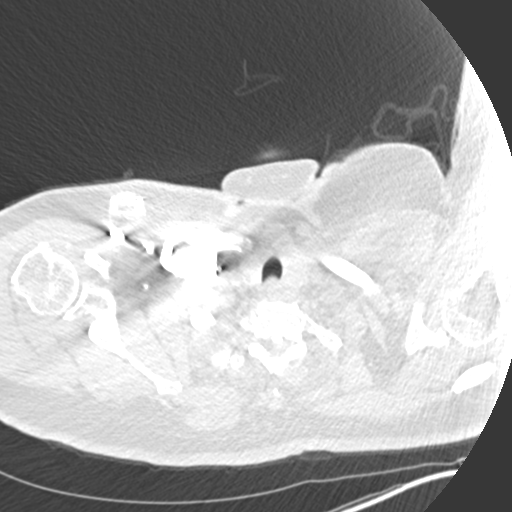

[Series 7: coronal mpr · coronal · 0.55mm/px · 1 of 149 slices shown]
[im 75/149  mediastinal]
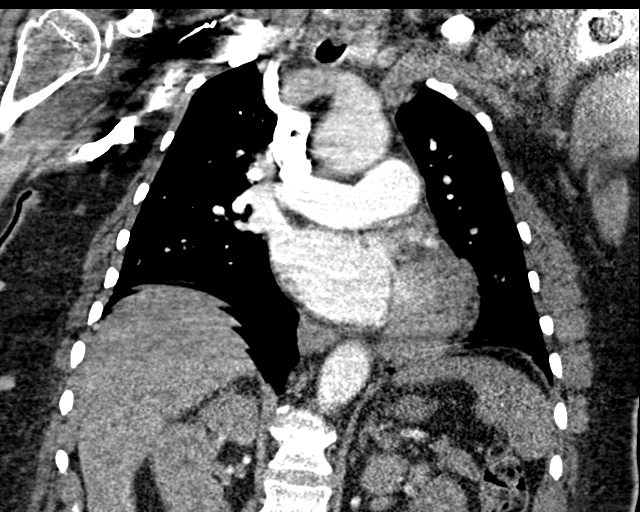

[18 of 36 positions shown; findings below may reference images not displayed]

FINDINGS: Cardiovascular: There is slightly suboptimal opacification of the
main pulmonary artery, however no central or proximal segmental
pulmonary embolism is seen. The heart is normal in size. No
pericardial effusion or thickening. No evidence right heart strain.
There is normal three-vessel brachiocephalic anatomy without
proximal stenosis. Scattered mild aortic atherosclerosis is noted.

Mediastinum/Nodes: No hilar, mediastinal, or axillary adenopathy.
Thyroid gland, trachea, and esophagus demonstrate no significant
findings.

Lungs/Pleura: Minimal ground-glass opacity seen at both lung bases.
No large airspace consolidation or pleural effusion.

Upper Abdomen: No acute abnormalities present in the visualized
portions of the upper abdomen.

Musculoskeletal: No chest wall abnormality. No acute or significant
osseous findings.

Review of the MIP images confirms the above findings.
IMPRESSION: Slightly suboptimal opacification of the main pulmonary artery,
however no central or proximal segmental pulmonary embolism

Minimal bibasilar atelectasis

Aortic Atherosclerosis ([XX]-[XX]).

## 2020-10-27 NOTE — Discharge Instructions (Addendum)
Please return to the emergency department if you develop worsening chest pain as discussed.  Please take pain medicine as prescribed.  Follow-up with cardiology.

## 2020-10-27 NOTE — Consult Note (Signed)
Cheryl Blankenship BHA:[id number] DOB: 1950/10/20 DOA: 10/26/2020     PCP: [name], PA-C     Patient arrived to ER on 10/26/20 at [facility] Referred by Attending Lennice Sites, DO   Patient coming from: home   Chief Complaint:   Chief Complaint  Patient presents with  . Chest Pain    HPI: Cheryl Blankenship is a 70 y.o. female with medical history significant of breast cancer diabetes    Presented with   1 week history of pain in her right breast as well as under her right breast reproducible by palpation she also noted that when she is talking she is aware of her breathing. Troponins within normal range x2  EKG nonischemic Although D-dimer was elevated PE study was negative for PE Discussed case at length with patient as well as ER provider At this point agree with no indication for admission Given muscular skeletal nature of pain and low suspicion for ACS Recommended patient to follow-up with her oncology given ongoing generalized pain after recent chemotherapy    The following Work up has been ordered so far:  Orders Placed This Encounter  Procedures  . SARS Coronavirus 2 by RT PCR (hospital order, performed in [facility] hospital lab) Nasopharyngeal Nasopharyngeal Swab  . DG Chest 2 View  . CT Angio Chest PE W and/or Wo Contrast  . Basic metabolic panel  . CBC  . Hepatic function panel  . Lipase, blood  . D-dimer, quantitative (not at [facility])  . Document Height and Actual Weight  . Consult to hospitalist  ALL PATIENTS BEING ADMITTED/HAVING PROCEDURES NEED COVID-19 SCREENING  . Airborne and Contact precautions  . ED EKG      Following Medications were ordered in ER: Medications  sodium chloride 0.9 % bolus 1,000 mL (1,000 mLs Intravenous New Bag/Given 10/26/20 2236)  HYDROmorphone (DILAUDID) injection 1 mg (1 mg Intravenous Given 10/26/20 2237)  ondansetron (ZOFRAN) injection 4 mg (4 mg Intravenous Given 10/26/20 2236)  iohexol (OMNIPAQUE) 350 MG/ML  injection 75 mL (75 mLs Intravenous Contrast Given 10/27/20 0004)    Significant initial  Findings: Abnormal Labs Reviewed  BASIC METABOLIC PANEL - Abnormal; Notable for the following components:      Result Value   Glucose, Bld 241 (*)    Creatinine, Ser 1.33 (*)    Calcium 10.6 (*)    GFR, Estimated 44 (*)    All other components within normal limits  CBC - Abnormal; Notable for the following components:   Platelets 104 (*)    All other components within normal limits  D-DIMER, QUANTITATIVE (NOT AT [facility]) - Abnormal; Notable for the following components:   D-Dimer, Quant 2.18 (*)    All other components within normal limits    Otherwise labs showing:    Recent Labs  Lab 10/26/20 1447  NA 141  K 3.6  CO2 26  GLUCOSE 241*  BUN 21  CREATININE 1.33*  CALCIUM 10.6*    Cr    Stable,  Lab Results  Component Value Date   CREATININE 1.33 (H) 10/26/2020   CREATININE 1.35 (H) 10/19/2020   CREATININE 1.02 (H) 09/01/2020    Recent Labs  Lab 10/26/20 2235  AST 38  ALT 29  ALKPHOS 67  BILITOT 0.8  PROT 7.9  ALBUMIN 3.9   Lab Results  Component Value Date   CALCIUM 10.6 (H) 10/26/2020        WBC      Component Value Date/Time  WBC 6.9 10/26/2020 1447   LYMPHSABS 3.3 10/19/2020 1315   MONOABS 0.4 10/19/2020 1315   EOSABS 0.2 10/19/2020 1315   BASOSABS 0.0 10/19/2020 1315        Plt: Lab Results  Component Value Date   PLT 104 (L) 10/26/2020    COVID-19 Labs  Recent Labs    10/26/20 2235  DDIMER 2.18*      HG/HCT * stable,  Down *Up from baseline see below    Component Value Date/Time   HGB 12.9 10/26/2020 1447   HGB 12.1 10/19/2020 1315   HCT 40.1 10/26/2020 1447   MCV 87.6 10/26/2020 1447    Recent Labs  Lab 10/26/20 2235  LIPASE 24   No results for input(s): AMMONIA in the last 168 hours.     Troponin 9-17 Cardiac Panel (last 3 results) No results for input(s): CKTOTAL, CKMB, TROPONINI, RELINDX in the last 72 hours.     ECG:  Ordered Personally reviewed by me showing: HR : 77 Rhythm  LBBB,     QTC 451   BNP (last 3 results) Recent Labs    08/25/20 2326  BNP 45.4    DM  labs:  HbA1C: Recent Labs    08/26/20 1014  HGBA1C 9.8*       ED Triage Vitals  Enc Vitals Group     BP 10/26/20 1428 (!) 177/79     Pulse Rate 10/26/20 1428 78     Resp 10/26/20 1428 18     Temp 10/26/20 1428 97.9 F (36.6 C)     Temp Source 10/26/20 1428 Oral     SpO2 10/26/20 1428 95 %     Weight --      Height --      Head Circumference --      Peak Flow --      Pain Score 10/26/20 1429 10     Pain Loc --      Pain Edu? --      Excl. in [place]? --   TMAX(24)@       Review of Systems:    Pertinent positives include: Chest pain  Constitutional:  No weight loss, night sweats, Fevers, chills, fatigue, weight loss  HEENT:  No headaches, Difficulty swallowing,Tooth/dental problems,Sore throat,  No sneezing, itching, ear ache, nasal congestion, post nasal drip,  Cardio-vascular:    Orthopnea, PND, anasarca, dizziness, palpitations.no Bilateral lower extremity swelling  GI:  No heartburn, indigestion, abdominal pain, nausea, vomiting, diarrhea, change in bowel habits, loss of appetite, melena, blood in stool, hematemesis Resp:  no shortness of breath at rest. No dyspnea on exertion, No excess mucus, no productive cough, No non-productive cough, No coughing up of blood.No change in color of mucus.No wheezing. Skin:  no rash or lesions. No jaundice GU:  no dysuria, change in color of urine, no urgency or frequency. No straining to urinate.  No flank pain.  Musculoskeletal:  No joint pain or no joint swelling. No decreased range of motion. No back pain.  Psych:  No change in mood or affect. No depression or anxiety. No memory loss.  Neuro: no localizing neurological complaints, no tingling, no weakness, no double vision, no gait abnormality, no slurred speech, no confusion  All systems reviewed and apart from [place] all  are negative  Past Medical History:   Past Medical History:  Diagnosis Date  . Anxiety   . Breast cancer ([place])   . CHF (congestive heart failure) ([place])   . Diabetes mellitus without complication (  [place])    type 2  . Hypertension       Past Surgical History:  Procedure Laterality Date  . APPENDECTOMY    . CHOLECYSTECTOMY    . COLONOSCOPY    . EYE SURGERY     cataracts  . MULTIPLE TOOTH EXTRACTIONS    . PORTACATH PLACEMENT N/A 08/04/2020   Procedure: INSERTION PORT-A-CATH WITH ULTRASOUND GUIDANCE;  Surgeon: [name], MD;  Location: [facility];  Service: General;  Laterality: N/A;  . STOMACH SURGERY      Social History:  Ambulatory  independently       reports that she has quit smoking. She has a 90.00 pack-year smoking history. She has never used smokeless tobacco. She reports previous alcohol use. She reports that she does not use drugs.     Family History:   Family History  Problem Relation Age of Onset  . Heart attack Sister   . Diabetes Sister   . Heart attack Mother   . Ovarian cancer Mother 64  . Heart attack Brother   . Cancer Niece        bone cancer   . Cancer Cousin        unknown type cancer   . Cancer Cousin        metastatic cancer to bone     Allergies: Allergies  Allergen Reactions  . Coconut (Cocos Nucifera) Allergy Skin Test     Hives and swells mouth  . Hydrochlorothiazide Swelling    tongue  . Lisinopril Swelling    Angioedema. Tolerates Losartan.  Marland Kitchen Coconut Oil Hives     Prior to Admission medications   Medication Sig Start Date End Date Taking? Authorizing Provider  Accu-Chek FastClix Lancets MISC  08/14/20   [provider]  acetaminophen (TYLENOL) 500 MG tablet Take 500 mg by mouth every 4 (four) hours as needed for mild pain.    [provider]  albuterol (VENTOLIN HFA) 108 (90 Base) MCG/ACT inhaler Inhale 1-2 puffs into the lungs every 4 (four) hours as needed for wheezing or shortness of breath. 09/01/20    Alla Feeling, NP  amLODipine (NORVASC) 10 MG tablet Take 1 tablet (10 mg total) by mouth daily. 10/09/20 09/02/21  Geralynn Rile, MD  aspirin 81 MG chewable tablet Chew 81 mg by mouth daily.     [provider]  carvedilol (COREG) 25 MG tablet Take 1 tablet (25 mg total) by mouth 2 (two) times daily with a meal. 10/09/20 01/07/21  [name], [name], MD  Cholecalciferol 25 MCG (1000 UT) capsule Take 1,000 Units by mouth daily. 06/25/20   [provider]  diphenoxylate-atropine (LOMOTIL) 2.5-0.025 MG tablet Take 1-2 tablets by mouth 4 (four) times daily as needed for diarrhea or loose stools. 09/01/20   Alla Feeling, NP  DULoxetine (CYMBALTA) 30 MG capsule Take 30 mg by mouth daily. 04/21/20   [provider]  empagliflozin (JARDIANCE) 10 MG TABS tablet Take 1 tablet (10 mg total) by mouth daily before breakfast. 10/09/20   [name], [name], MD  fluorometholone (FML) 0.1 % ophthalmic suspension Place 1 drop into both eyes daily. 06/09/20   [provider]  folic acid (FOLVITE) 1 MG tablet Take 1 tablet by mouth daily. 07/14/20   [provider]  gabapentin (NEURONTIN) 400 MG capsule Take 400 mg by mouth 3 (three) times daily.  04/21/20   [provider]  glipiZIDE (GLUCOTROL XL) 10 MG 24 hr tablet Take 10 mg by mouth daily  with breakfast.  06/24/20   [provider]  HYDROcodone-acetaminophen (NORCO/VICODIN) 5-325 MG tablet Take 1 tablet by mouth every 6 (six) hours as needed for up to 15 doses for moderate pain. 10/26/20   Curatolo, Adam, DO  insulin aspart (NOVOLOG) 100 UNIT/ML injection Inject 2-10 Units into the skin 3 (three) times daily before meals. Per sliding scale:  150-199 = 2 units, 200-249 = 4 units, 250-299 = 6 units, 300-349 = 8 units, greater than350 = 10 units.    [provider]  insulin glargine (LANTUS SOLOSTAR) 100 UNIT/ML Solostar Pen Inject 35 Units into the skin 2 (two) times daily. Patient taking  differently: Inject 35 Units into the skin 2 (two) times daily. Pt takes 70 units in the morning and 70 units at night. 08/27/20   Oretha Milch D, MD  lidocaine-prilocaine (EMLA) cream APPLY TO AFFECTED AREA 1 time DAILY 09/15/20   Alla Feeling, NP  losartan (COZAAR) 100 MG tablet Take 1 tablet (100 mg total) by mouth daily. 10/09/20   Geralynn Rile, MD  metFORMIN (GLUCOPHAGE) 500 MG tablet Take 500 mg by mouth 2 (two) times daily with a meal.  06/25/20   [provider]  Nutritional Supplements (FEEDING SUPPLEMENT, GLUCERNA 1.2 CAL,) LIQD Take 237 mLs by mouth 3 (three) times daily with meals.    [provider]  Omega-3 Fatty Acids (FISH OIL) 1000 MG CAPS Take 1,000 mg by mouth daily.    [provider]  omeprazole (PRILOSEC) 20 MG capsule Take 1 capsule (20 mg total) by mouth daily. 10/09/20   Geralynn Rile, MD  ondansetron (ZOFRAN-ODT) 4 MG disintegrating tablet Take 1 tablet (4 mg total) by mouth 2 (two) times daily as needed for up to 30 doses for nausea or vomiting. 10/26/20   Curatolo, Adam, DO  potassium chloride (KLOR-CON) 10 MEQ tablet Take 20 mEq by mouth daily. DAILY WITH LASIX    [provider]  prochlorperazine (COMPAZINE) 10 MG tablet Take 1 tablet (10 mg total) by mouth every 6 (six) hours as needed for nausea or vomiting. 10/19/20   [name], MD  RESTASIS 0.05 % ophthalmic emulsion Place 1 drop into both eyes 2 (two) times daily. 07/13/20   [provider]  rosuvastatin (CRESTOR) 5 MG tablet Take 5 mg by mouth at bedtime. 06/25/20   [provider]  spironolactone (ALDACTONE) 25 MG tablet Take 1 tablet (25 mg total) by mouth daily. 10/09/20   O'NealCassie Freer, MD  tiZANidine (ZANAFLEX) 4 MG tablet Take 4 mg by mouth 2 (two) times daily as needed for muscle spasms.  06/23/20   [provider]  torsemide (DEMADEX) 20 MG tablet Take 2 tablets (40 mg total) by mouth daily. 10/09/20 01/07/21  O'NealCassie Freer, MD  vitamin E 180 MG (400 UNITS) capsule Take 400 Units by mouth daily.    [provider]   Physical Exam: Vitals with BMI 10/26/2020 10/26/2020 10/26/2020  Height - - -  Weight - - -  BMI - - -  Systolic [phone]  Diastolic 95 88 79  Pulse 71 76 78     1. General:  in No Acute distress   Chronically ill -appearing 2. Psychological: Alert and  Oriented 3. Head/ENT:   Moist Mucous Membranes                          Head Non traumatic, neck supple  Poor Dentition 4. SKIN:   decreased Skin turgor,  Skin clean Dry and intact no rash 5. Heart: Regular rate and rhythm no  Murmur, no Rub or gallop 6. Lungs:  Clear to auscultation bilaterally, no wheezes or crackles   7. Abdomen: Soft,  non-tender, Non distended  obese   8. Lower extremities: no clubbing, cyanosis, no   edema 9. Neurologically Grossly intact, moving all 4 extremities equally  10. MSK: Normal range of motion Right breast exam significant for tenderness to palpation but no skin changes consistent with cellulitis present.  Recommended following up with oncology given history of left-sided breast cancer  All other LABS:     Recent Labs  Lab 10/26/20 1447  WBC 6.9  HGB 12.9  HCT 40.1  MCV 87.6  PLT 104*     Recent Labs  Lab 10/26/20 1447  NA 141  K 3.6  CL 100  CO2 26  GLUCOSE 241*  BUN 21  CREATININE 1.33*  CALCIUM 10.6*     Recent Labs  Lab 10/26/20 2235  AST 38  ALT 29  ALKPHOS 67  BILITOT 0.8  PROT 7.9  ALBUMIN 3.9       Cultures: No results found for: SDES, [place], CULT, REPTSTATUS   Radiological Exams on Admission: DG Chest 2 View  Result Date: 10/26/2020 CLINICAL DATA:  Chest pain EXAM: CHEST - 2 VIEW COMPARISON:  08/25/2020 FINDINGS: Cardiac shadow is stable. Aortic calcifications are again seen. Right-sided chest wall port is noted in satisfactory position. Lungs are clear. No bony abnormality is noted. IMPRESSION: No active cardiopulmonary  disease. Electronically Signed   By: Inez Catalina M.D.   On: 10/26/2020 15:13    Chart has been reviewed    Dekota Shenk 10/27/2020, 1:53 AM    [facility]     after 2 AM please page floor coverage PA If 7AM-7PM, please contact the day team taking care of the patient using [email or web address]   Patient was evaluated in the context of the global COVID-19 pandemic, which necessitated consideration that the patient might be at risk for infection with the SARS-CoV-2 virus that causes COVID-19. Institutional protocols and algorithms that pertain to the evaluation of patients at risk for COVID-19 are in a state of rapid change based on information released by regulatory bodies including the CDC and federal and state organizations. These policies and algorithms were followed during the patient's care.

## 2020-10-28 ENCOUNTER — Inpatient Hospital Stay: Payer: Medicare HMO

## 2020-10-28 ENCOUNTER — Encounter: Payer: Self-pay | Admitting: Hematology

## 2020-10-28 ENCOUNTER — Other Ambulatory Visit: Payer: Self-pay

## 2020-10-28 ENCOUNTER — Telehealth: Payer: Self-pay

## 2020-10-28 ENCOUNTER — Inpatient Hospital Stay: Payer: Medicare HMO | Attending: Hematology | Admitting: Hematology

## 2020-10-28 VITALS — BP 171/98 | HR 71 | Temp 99.0°F | Resp 16 | Ht 63.0 in

## 2020-10-28 DIAGNOSIS — R112 Nausea with vomiting, unspecified: Secondary | ICD-10-CM | POA: Insufficient documentation

## 2020-10-28 DIAGNOSIS — Z170 Estrogen receptor positive status [ER+]: Secondary | ICD-10-CM | POA: Diagnosis not present

## 2020-10-28 DIAGNOSIS — C50412 Malignant neoplasm of upper-outer quadrant of left female breast: Secondary | ICD-10-CM

## 2020-10-28 DIAGNOSIS — R079 Chest pain, unspecified: Secondary | ICD-10-CM | POA: Insufficient documentation

## 2020-10-28 DIAGNOSIS — Z8616 Personal history of COVID-19: Secondary | ICD-10-CM | POA: Insufficient documentation

## 2020-10-28 LAB — CBC WITH DIFFERENTIAL (CANCER CENTER ONLY)
Abs Immature Granulocytes: 0.01 10*3/uL (ref 0.00–0.07)
Basophils Absolute: 0 10*3/uL (ref 0.0–0.1)
Basophils Relative: 1 %
Eosinophils Absolute: 0.1 10*3/uL (ref 0.0–0.5)
Eosinophils Relative: 2 %
HCT: 37.6 % (ref 36.0–46.0)
Hemoglobin: 12.3 g/dL (ref 12.0–15.0)
Immature Granulocytes: 0 %
Lymphocytes Relative: 38 %
Lymphs Abs: 2.4 10*3/uL (ref 0.7–4.0)
MCH: 27.6 pg (ref 26.0–34.0)
MCHC: 32.7 g/dL (ref 30.0–36.0)
MCV: 84.3 fL (ref 80.0–100.0)
Monocytes Absolute: 0.7 10*3/uL (ref 0.1–1.0)
Monocytes Relative: 10 %
Neutro Abs: 3.2 10*3/uL (ref 1.7–7.7)
Neutrophils Relative %: 49 %
Platelet Count: 117 10*3/uL — ABNORMAL LOW (ref 150–400)
RBC: 4.46 MIL/uL (ref 3.87–5.11)
RDW: 12.9 % (ref 11.5–15.5)
WBC Count: 6.5 10*3/uL (ref 4.0–10.5)
nRBC: 0 % (ref 0.0–0.2)

## 2020-10-28 LAB — CMP (CANCER CENTER ONLY)
ALT: 27 U/L (ref 0–44)
AST: 33 U/L (ref 15–41)
Albumin: 3.7 g/dL (ref 3.5–5.0)
Alkaline Phosphatase: 71 U/L (ref 38–126)
Anion gap: 8 (ref 5–15)
BUN: 11 mg/dL (ref 8–23)
CO2: 30 mmol/L (ref 22–32)
Calcium: 10.7 mg/dL — ABNORMAL HIGH (ref 8.9–10.3)
Chloride: 97 mmol/L — ABNORMAL LOW (ref 98–111)
Creatinine: 1.03 mg/dL — ABNORMAL HIGH (ref 0.44–1.00)
GFR, Estimated: 60 mL/min — ABNORMAL LOW (ref >60)
Glucose, Bld: 297 mg/dL — ABNORMAL HIGH (ref 70–99)
Potassium: 3.7 mmol/L (ref 3.5–5.1)
Sodium: 135 mmol/L (ref 135–145)
Total Bilirubin: 0.6 mg/dL (ref 0.3–1.2)
Total Protein: 7.9 g/dL (ref 6.5–8.1)

## 2020-10-28 MED ORDER — PROMETHAZINE HCL 25 MG/ML IJ SOLN: 25.0000 mg | Freq: Once | INTRAMUSCULAR | Status: DC

## 2020-10-28 MED ORDER — SODIUM CHLORIDE 0.9% FLUSH
10.0000 mL | Freq: Once | INTRAVENOUS | Status: AC | PRN
  Administered 2020-10-28: 10 mL
  Filled 2020-10-28: qty 10

## 2020-10-28 MED ORDER — HEPARIN SOD (PORK) LOCK FLUSH 100 UNIT/ML IV SOLN
500.0000 [IU] | Freq: Once | INTRAVENOUS | Status: DC | PRN
  Filled 2020-10-28: qty 5

## 2020-10-28 MED ORDER — SODIUM CHLORIDE 0.9 % IV SOLN
12.5000 mg | Freq: Once | INTRAVENOUS | Status: DC
  Filled 2020-10-28: qty 0.5

## 2020-10-28 MED ORDER — SODIUM CHLORIDE 0.9 % IV SOLN
Freq: Once | INTRAVENOUS | Status: AC
  Filled 2020-10-28: qty 250

## 2020-10-28 MED ORDER — PROMETHAZINE HCL 25 MG/ML IJ SOLN
INTRAMUSCULAR | Status: AC
  Filled 2020-10-28: qty 1

## 2020-10-28 MED ORDER — ZOLPIDEM TARTRATE 5 MG PO TABS: 5.0000 mg | ORAL_TABLET | Freq: Every evening | ORAL | 0 refills | Status: AC | PRN

## 2020-10-28 MED ORDER — SODIUM CHLORIDE 0.9 % IV SOLN
25.0000 mg | Freq: Once | INTRAVENOUS | Status: AC
  Administered 2020-10-28: 25 mg via INTRAVENOUS
  Filled 2020-10-28: qty 1

## 2020-10-28 MED ORDER — PROMETHAZINE HCL 25 MG RE SUPP: 25.0000 mg | Freq: Three times a day (TID) | RECTAL | 1 refills | Status: DC | PRN

## 2020-10-28 NOTE — Telephone Encounter (Signed)
Ms White's daughter Laqueta Due called. Ms Downie is still  having pain under her arm and on top of her breast.  She went to the ED on Tuesday.  She is now nauseated and vomiting.  I instructed her to take zofran 8mg  total, 2 of the 4mg  OTC tablets.  I also advised her to take pain medication with food.  She states she is.  Appts made for today for evaluation.

## 2020-10-28 NOTE — Progress Notes (Signed)
[name]   [phone] [phone]) [phone]   Clinic Follow up Note   Patient Care Team: [name], PA-C as PCP - General (Internal Medicine) [name], RN as Oncology Nurse Navigator [name], RN as Oncology Nurse Navigator [name], MD as Consulting Physician (General Surgery) [name], MD as Consulting Physician (Hematology) [name], MD as Consulting Physician (Radiation Oncology) [name], [name], MD as Consulting Physician (Cardiology)  Date of Service:  10/28/2020  CHIEF COMPLAINT: F/u of left breast cancer  SUMMARY OF ONCOLOGIC HISTORY: Oncology History Overview Note  Cancer Staging Malignant neoplasm of upper-outer quadrant of left breast in female, estrogen receptor positive ([place]) Staging form: Breast, AJCC 8th Edition - Clinical stage from 07/16/2020: Stage IB (cT2, cN1, cM0, G2, ER+, PR+, HER2+) - Signed by [name], MD on 07/21/2020    Malignant neoplasm of upper-outer quadrant of left breast in female, estrogen receptor positive ([place])  07/01/2020 Mammogram   IMPRESSION: 1. There is a 12 cm span of highly suspicious calcifications involving the upper outer and upper inner quadrants of the left breast. On ultrasound, within this area of calcifications, there are confluent masses at 1 o'clock spanning at least 4.5 cm. An additional suspicious mass is seen in the left breast at 2 o'clock.  -Ultrasound targeted to the left breast at 1 o'clock, 5 cm from the nipple demonstrates a broad area of hypoechoic irregular tissue/confluent masses spanning at least 4.5 cm. There is a separate oval hypoechoic mass with indistinct margins at 2 o'clock, 7 cm from the nipple measuring 0.9 x 0.4 x 0.9 cm. Ultrasound of the left axilla demonstrates 2 abnormally thickened lymph nodes with cortices measuring 6-7 mm. There is an additional lymph node with a borderline cortex of 4 mm.   2. There are 2 lymph nodes  with thickened cortices in the left axilla, and an additional lymph node with a borderline thickened cortex.   07/16/2020 Cancer Staging   Staging form: Breast, AJCC 8th Edition - Clinical stage from 07/16/2020: Stage IIA (cT3, cN1, cM0, G2, ER+, PR+, HER2+) - Signed by [name], MD on 08/05/2020   07/16/2020 Initial Biopsy   Diagnosis 1. 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 Breast, left, needle core biopsy, 1 o'clock - INVASIVE DUCTAL CARCINOMA - SEE COMMENT 3. Lymph node, biopsy - METASTATIC CARCINOMA INVOLVING A LYMPH NODE - SEE COMMENT Microscopic Comment 1. Based on the biopsy, the ductal carcinoma in situ has a comedo pattern, high nuclear grade and measures 0.7 cm in greatest linear extent. Prognostic markers (ER/PR) are pending and will be reported in an addendum. 2. Based on the biopsy, the carcinoma appears Nottingham grade 2 of 3 and measures 1.5 cm in greatest linear extent. Prognostic markers (ER/PR/ki-67/HER2) are pending and will be reported in an addendum. Dr. Saralyn Pilar reviewed the case and agrees with the above diagnosis. These results were called to [facility] on July 17, 2020. 3. Based on the biopsy, the carcinoma appears measures 1.0 cm in greatest linear extent. Prognostic markers (ER/PR/ki-67/HER2) are pending and will be reported in an addendum.   07/16/2020 Receptors her2   1. PROGNOSTIC INDICATORS Results: IMMUNOHISTOCHEMICAL AND MORPHOMETRIC ANALYSIS PERFORMED MANUALLY Estrogen Receptor: 30%, POSITIVE, STRONG STAINING INTENSITY Progesterone Receptor: 5%, POSITIVE, STRONG STAINING INTENSITY  2. PROGNOSTIC INDICATORS Results: IMMUNOHISTOCHEMICAL AND MORPHOMETRIC ANALYSIS PERFORMED MANUALLY The tumor cells are POSITIVE for  Her2 (3+). Estrogen Receptor: 85%, POSITIVE, STRONG STAINING INTENSITY Progesterone Receptor:  60%, POSITIVE, STRONG STAINING INTENSITY Proliferation Marker Ki67: 25%  3. PROGNOSTIC INDICATORS Results: IMMUNOHISTOCHEMICAL AND MORPHOMETRIC ANALYSIS PERFORMED MANUALLY The tumor cells are POSITIVE for Her2 (3+). Estrogen Receptor: 95%, POSITIVE, STRONG STAINING INTENSITY Progesterone Receptor: 45%, POSITIVE, STRONG STAINING INTENSITY Proliferation Marker Ki67: 30%   07/20/2020 Initial Diagnosis   Malignant neoplasm of upper-outer quadrant of left breast in female, estrogen receptor positive ([place])   07/31/2020 Echocardiogram   Baseline Echo  IMPRESSIONS     1. Left ventricular ejection fraction, by estimation, is 65 to 70%. The  left ventricle has normal function. The left ventricle has no regional  wall motion abnormalities. Left ventricular diastolic parameters are  indeterminate. The average left  ventricular global longitudinal strain is -17.1 %.   2. Right ventricular systolic function is normal. The right ventricular  size is normal. There is normal pulmonary artery systolic pressure.   3. The mitral valve is normal in structure. Trivial mitral valve  regurgitation. No evidence of mitral stenosis.   4. The aortic valve is normal in structure. Aortic valve regurgitation is  not visualized. No aortic stenosis is present.    07/31/2020 Breast MRI   IMPRESSION: 1. Extensive non mass enhancement within the left breast extending from the nipple to the chest wall. Overall findings are most compatible with extensive malignancy involving the majority of the left breast. 2. Indeterminate enhancing mass within the upper inner right breast. 3. Three morphologically abnormal lymph nodes within the left axilla. One of these nodes has been biopsied compatible with metastatic adenopathy.     08/03/2020 Imaging   Bone Scan whole body  IMPRESSION: Increased radiotracer uptake in the mid to lower lumbar regions of uncertain etiology. Correlation with radiography to assess  for potential arthropathy in these areas advised. Increased uptake in major joints is likely of arthropathic etiology. Increased uptake in the mid face is likely due to paranasal sinus disease. Distribution of radiotracer uptake in bony structures elsewhere unremarkable.   08/04/2020 Procedure   PAC placement by Dr [name]   08/05/2020 - 10/28/2020 Neo-Adjuvant Chemotherapy   Neoadjuvant THP q3weeks for 4-6 cycles starting 08/05/20, followed by Herceptin or Kadcyla q3weeks to complete 1 year of treatment. Stopped THP after 1 dose due to poor toleration       -I switched her to Treutlen starting 08/25/20. Stopped after C2 due to poor toleration (10/28/20)   08/07/2020 Imaging   CT CAP  IMPRESSION: 2.7 cm mass in the upper outer left breast, likely corresponding to the patient's known primary breast neoplasm.   8 mm short axis left axillary node, suspicious for nodal metastasis in this clinical context.   No findings suspicious for distant metastasis.   Endometrium is mildly prominent, measuring 11 mm. Correlate for vaginal bleeding and consider pelvic ultrasound for further evaluation if clinically warranted.   08/11/2020 Pathology Results   Diagnosis Breast, right, needle core biopsy, right breast - PAPILLARY LESION - SEE COMMENT Microscopic Comment The biopsy consists of a papillary lesion with a focal area of adenosis. Immunohistochemistry (SMM, calponin and p63) shows retention of the myoepithelial cell layer in the focus of adenosis. 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 These results were called to [facility] on August 12, 2020.      CURRENT THERAPY:  -Neo-adjuvantKadcyla q3weeks starting 08/25/20, D/c after C2 due to poor toleration. PENDING antiestrogen and Herceptin therapy.  -Symptom Management  INTERVAL HISTORY:  Meggin Ola is here for a follow up. She presents to the clinic with her daughter. She notes  since she had treatment she was fatigued and tired. She notes after 7 days she started having burning pain in her chest and underarms along with abdominal pain and constipation. She had nausea and vomiting which lead her to go to Urgent care 2 days ago. She had enlargement on left side of heart on EKG and was told to go to ER, which she went same day. She was given CT Angio yesterday which was negative. She was given Norco for pain 2 days ago which has not helped. She notes for her trouble sleeping she tried melatonin which did not help. She notes her last BM was over a week.     REVIEW OF SYSTEMS:   Constitutional: Denies fevers, chills or abnormal weight loss (+) Fatigue (+) Insomnia  Eyes: Denies blurriness of vision Ears, nose, mouth, throat, and face: Denies mucositis or sore throat Respiratory: Denies cough, dyspnea or wheezes  Cardiovascular: Denies palpitation, chest discomfort or lower extremity swelling Gastrointestinal: Denies heartburn or change in bowel habits (+) N&V (+) Abdominal pain, constipation Skin: Denies abnormal skin rashes Lymphatics: Denies new lymphadenopathy or easy bruising Neurological:Denies numbness, tingling or new weaknesses Behavioral/Psych: Mood is stable, no new changes  All other systems were reviewed with the patient and are negative.  MEDICAL HISTORY:  Past Medical History:  Diagnosis Date  . Anxiety   . Breast cancer ([place])   . CHF (congestive heart failure) ([place])   . Diabetes mellitus without complication ([place])    type 2  . Hypertension     SURGICAL HISTORY: Past Surgical History:  Procedure Laterality Date  . APPENDECTOMY    . CHOLECYSTECTOMY    . COLONOSCOPY    . EYE SURGERY     cataracts  . MULTIPLE TOOTH EXTRACTIONS    . PORTACATH PLACEMENT N/A 08/04/2020   Procedure: INSERTION PORT-A-CATH WITH ULTRASOUND GUIDANCE;  Surgeon: [name], MD;  Location: [facility];  Service: General;  Laterality: N/A;  . STOMACH SURGERY      I have  reviewed the social history and family history with the patient and they are unchanged from previous note.  ALLERGIES:  is allergic to coconut (cocos nucifera) allergy skin test, hydrochlorothiazide, lisinopril, and coconut oil.  MEDICATIONS:  Current Outpatient Medications  Medication Sig Dispense Refill  . promethazine (PHENERGAN) 25 MG suppository Place 1 suppository (25 mg total) rectally every 8 (eight) hours as needed for nausea or vomiting. 30 each 1  . zolpidem (AMBIEN) 5 MG tablet Take 1 tablet (5 mg total) by mouth at bedtime as needed for sleep. 20 tablet 0  . Accu-Chek FastClix Lancets MISC     . acetaminophen (TYLENOL) 500 MG tablet Take 500 mg by mouth every 4 (four) hours as needed for mild pain.    Marland Kitchen albuterol (VENTOLIN HFA) 108 (90 Base) MCG/ACT inhaler Inhale 1-2 puffs into the lungs every 4 (four) hours as needed for wheezing or shortness of breath. 8 g 0  . amLODipine (NORVASC) 10 MG tablet Take 1 tablet (10 mg total) by mouth daily. 30 tablet 10  . aspirin 81 MG chewable tablet Chew 81 mg by mouth daily.     . carvedilol (COREG) 25 MG tablet Take 1 tablet (25 mg total) by mouth 2 (two) times daily with a meal. 180 tablet 3  . Cholecalciferol 25 MCG (1000 UT) capsule Take 1,000 Units by mouth  daily.    . diphenoxylate-atropine (LOMOTIL) 2.5-0.025 MG tablet Take 1-2 tablets by mouth 4 (four) times daily as needed for diarrhea or loose stools. 60 tablet 2  . DULoxetine (CYMBALTA) 30 MG capsule Take 30 mg by mouth daily.    . empagliflozin (JARDIANCE) 10 MG TABS tablet Take 1 tablet (10 mg total) by mouth daily before breakfast. 90 tablet 1  . fluorometholone (FML) 0.1 % ophthalmic suspension Place 1 drop into both eyes daily.    . folic acid (FOLVITE) 1 MG tablet Take 1 tablet by mouth daily.    Marland Kitchen gabapentin (NEURONTIN) 400 MG capsule Take 400 mg by mouth 3 (three) times daily.     Marland Kitchen glipiZIDE (GLUCOTROL XL) 10 MG 24 hr tablet Take 10 mg by mouth daily with breakfast.     .  HYDROcodone-acetaminophen (NORCO/VICODIN) 5-325 MG tablet Take 1 tablet by mouth every 6 (six) hours as needed for up to 15 doses for moderate pain. 15 tablet 0  . insulin aspart (NOVOLOG) 100 UNIT/ML injection Inject 2-10 Units into the skin 3 (three) times daily before meals. Per sliding scale:  150-199 = 2 units, 200-249 = 4 units, 250-299 = 6 units, 300-349 = 8 units, greater than350 = 10 units.    . insulin glargine (LANTUS SOLOSTAR) 100 UNIT/ML Solostar Pen Inject 35 Units into the skin 2 (two) times daily. (Patient taking differently: Inject 35 Units into the skin 2 (two) times daily. Pt takes 70 units in the morning and 70 units at night.) 15 mL 11  . lidocaine-prilocaine (EMLA) cream APPLY TO AFFECTED AREA 1 time DAILY 30 g 3  . losartan (COZAAR) 100 MG tablet Take 1 tablet (100 mg total) by mouth daily. 90 tablet 1  . metFORMIN (GLUCOPHAGE) 500 MG tablet Take 500 mg by mouth 2 (two) times daily with a meal.     . Nutritional Supplements (FEEDING SUPPLEMENT, GLUCERNA 1.2 CAL,) LIQD Take 237 mLs by mouth 3 (three) times daily with meals.    . Omega-3 Fatty Acids (FISH OIL) 1000 MG CAPS Take 1,000 mg by mouth daily.    Marland Kitchen omeprazole (PRILOSEC) 20 MG capsule Take 1 capsule (20 mg total) by mouth daily. 30 capsule 11  . ondansetron (ZOFRAN-ODT) 4 MG disintegrating tablet Take 1 tablet (4 mg total) by mouth 2 (two) times daily as needed for up to 30 doses for nausea or vomiting. 30 tablet 0  . potassium chloride (KLOR-CON) 10 MEQ tablet Take 20 mEq by mouth daily. DAILY WITH LASIX    . prochlorperazine (COMPAZINE) 10 MG tablet Take 1 tablet (10 mg total) by mouth every 6 (six) hours as needed for nausea or vomiting. 30 tablet 1  . RESTASIS 0.05 % ophthalmic emulsion Place 1 drop into both eyes 2 (two) times daily.    . rosuvastatin (CRESTOR) 5 MG tablet Take 5 mg by mouth at bedtime.    Marland Kitchen spironolactone (ALDACTONE) 25 MG tablet Take 1 tablet (25 mg total) by mouth daily. 90 tablet 1  . tiZANidine  (ZANAFLEX) 4 MG tablet Take 4 mg by mouth 2 (two) times daily as needed for muscle spasms.     Marland Kitchen torsemide (DEMADEX) 20 MG tablet Take 2 tablets (40 mg total) by mouth daily. 180 tablet 3  . vitamin E 180 MG (400 UNITS) capsule Take 400 Units by mouth daily.     No current facility-administered medications for this visit.    PHYSICAL EXAMINATION: ECOG PERFORMANCE STATUS: 3 - Symptomatic, >50% confined to bed  Vitals:   10/28/20 1457  BP: (!) 171/98  Pulse: 71  Resp: 16  Temp: 99 F (37.2 C)  SpO2: 97%   Filed Weights    Due to COVID19 we will limit examination to appearance. Patient had no complaints.  GENERAL:alert, no distress  SKIN: skin color normal, no rashes or significant lesions EYES: normal, Conjunctiva are pink and non-injected, sclera clear  NEURO: alert & oriented x 3 with fluent speech   LABORATORY DATA:  I have reviewed the data as listed CBC Latest Ref Rng & Units 10/28/2020 10/26/2020 10/19/2020  WBC 4.0 - 10.5 K/uL 6.5 6.9 7.3  Hemoglobin 12.0 - 15.0 g/dL 12.3 12.9 12.1  Hematocrit 36.0 - 46.0 % 37.6 40.1 38.5  Platelets 150 - 400 K/uL 117(L) 104(L) 238     CMP Latest Ref Rng & Units 10/28/2020 10/26/2020 10/19/2020  Glucose 70 - 99 mg/dL 297(H) 241(H) 223(H)  BUN 8 - 23 mg/dL 11 21 29(H)  Creatinine 0.44 - 1.00 mg/dL 1.03(H) 1.33(H) 1.35(H)  Sodium 135 - 145 mmol/L 135 141 138  Potassium 3.5 - 5.1 mmol/L 3.7 3.6 4.1  Chloride 98 - 111 mmol/L 97(L) 100 103  CO2 22 - 32 mmol/L 30 26 29   Calcium 8.9 - 10.3 mg/dL 10.7(H) 10.6(H) 10.4(H)  Total Protein 6.5 - 8.1 g/dL 7.9 7.9 7.0  Total Bilirubin 0.3 - 1.2 mg/dL 0.6 0.8 0.4  Alkaline Phos 38 - 126 U/L 71 67 63  AST 15 - 41 U/L 33 38 13(L)  ALT 0 - 44 U/L 27 29 15       RADIOGRAPHIC STUDIES: I have personally reviewed the radiological images as listed and agreed with the findings in the report. CT Angio Chest PE W and/or Wo Contrast  Result Date: 10/27/2020 CLINICAL DATA:  Chest pain and positive D-dimer  EXAM: CT ANGIOGRAPHY CHEST WITH CONTRAST TECHNIQUE: Multidetector CT imaging of the chest was performed using the standard protocol during bolus administration of intravenous contrast. Multiplanar CT image reconstructions and MIPs were obtained to evaluate the vascular anatomy. CONTRAST:  17m OMNIPAQUE IOHEXOL 350 MG/ML SOLN COMPARISON:  None. FINDINGS: Cardiovascular: There is slightly suboptimal opacification of the main pulmonary artery, however no central or proximal segmental pulmonary embolism is seen. The heart is normal in size. No pericardial effusion or thickening. No evidence right heart strain. There is normal three-vessel brachiocephalic anatomy without proximal stenosis. Scattered mild aortic atherosclerosis is noted. Mediastinum/Nodes: No hilar, mediastinal, or axillary adenopathy. Thyroid gland, trachea, and esophagus demonstrate no significant findings. Lungs/Pleura: Minimal ground-glass opacity seen at both lung bases. No large airspace consolidation or pleural effusion. Upper Abdomen: No acute abnormalities present in the visualized portions of the upper abdomen. Musculoskeletal: No chest wall abnormality. No acute or significant osseous findings. Review of the MIP images confirms the above findings. IMPRESSION: Slightly suboptimal opacification of the main pulmonary artery, however no central or proximal segmental pulmonary embolism Minimal bibasilar atelectasis Aortic Atherosclerosis (ICD10-I70.0). Electronically Signed   By: BPrudencio PairM.D.   On: 10/27/2020 00:27     ASSESSMENT & PLAN:  Cheryl Taddeois a 70y.o. female with   1.Malignant neoplasm of upper-outer quadrant of left breast, StageII, c(T2N1M0), ER+/PR+/HER2+, GradeII -She was diagnosed in 06/2020.She has 12cm calcification and 4.5cm mass in her left breast with2 abnormalLN. Her biopsy showed her calcifications are DCIS and her 4.5cm mass is invasive ductal carcinoma with LN involvement, ER/PR/HER2  positive. -HerMRI Breast from 07/31/20 shows very extensive disease in her left breast and 3  abnormal LNs in the left axilla. There isindeterminate enhancing17mmass within the upper inner right breast. -Her 08/11/20 right breast biopsy showed benign papillary lesion. -Herbone scan from 08/03/20 shows uptake in her lumbar spine, but nonspecific. She has known lumbar back pain from arthritis. Her CT CAP from 08/07/20 showed known breast masses and left axillary LN andnodistant metastasis. She has mild thickening of endometrium, pt denies vaginal bleeding. -Given theextensivedisease in her left breast,shewillneedmastectomy with Dr TCindra Evesthe only way to cure her cancer. -She startedneoadjuvantchemoTHPq3weekson11/17/21. Due to poor toleration after C1, she was switched to KRyder Systemon 09/04/20. Plan for 5-6 cycles in total. C2 postponed due to COVID (+) in early 09/2020.  -She tolerated first Kadcyla moderately well with nausea and diarrhea. However, she poorly tolerated C2 with persistent N&V, Constipation, Abdominal pain, chest pain, insomnia. Will d/c Kadcyla.  -I discussed starting antiestrogen therapy sooner and continuing anti-HER2 treatment with Herceptin q3weeks while we consult on when to proceed with breast surgery. She is open to this.  -f/u on 2/11   2. Symptoms Management: General body pain, Diarrhea, N&V  -With first week on THP she hadgeneral body pain, mainly in legs, shoulders and neck, diarrhea, N&V, Fatigue.  -She continues to have upper leg pain with cramping. She notes she is taking her potassium. I recommend she increase her Cymbalta.  -She had been using imodium and lomotil for her diarrhea, but has not taken lately.  -Her THP was changed to [facility] [place] 09/04/20. She moderately tolerated first dose of Kadcyla but second cycle she had 10 days of significant fatigue and 7-10 days later experienced intractable N&V, abdominal pain, constipation,  insomnia and chest pain.  -For insomnia melatonin has not helped. I will call in short course Ambien to only take as needed, not nightly.  -For nausea she has zofran and compazine, but not enough. She is struggling to keep anything down including pills. I will call in phenergan and give IV Fluids today (2/29/22).  -She has not had BM in 1 week. I recommend she use fleet enema and then Doculax suppository. She can use Miralax as needed going forward. I recommend low residual diet for now.  -For pain she has tried Norco and Oxycodone, most recently was prescribed Norco on ED 10/26/20 discharge.  -With BM has abdominal pain can improve. For her chest pain will evaluate with her cardiologist.   3. Comorbidities: CHF,poorly controlledDM with neuropathy, HTN, Arthritis in hips, Elevated Cr, LE edema   -Due to Nausea and stomach pain fromJardiance, she takes Zofran as needed.  -Continue medications and f/u with PCP and Cardiologist Dr. OMarisue Ivan  -She had 2 falls in 2021due to multiple medications and poor sleep. She ambulates with cane.  -Pt is on Lasix which she notes leads to urinary incontinence. She wears depends for this. -Her baseline ECHO from 07/31/20 was normal. Will monitor on Herceptin.  -She has had increased LE edema from her recent CHF exacerbation. She will continue to f/u with Cardiologist.   4. Chest pain  -For the past 2 days (10/26/20) she has been having new chest pain and underarm burning sensation. Pt notes her Urgent care EKG was abnormal, but was not of concern on ER work up. Her 10/26/20 ED CT Angio was negative but she did have slightly elevated troponin. I will consult with her cardiologist, to discuss possible etiology.   5. Genetic testing  -Given her mild family history of cancer (mother with Ovarian, niece and 2 cousins with cancer). She agreed  to proceed but has not proceeded yet.   6. Social Support -She does have decreased memory, she is on medication. Her family help  her with history. -She lives in [place] alone in an apartmentwith other older women. Sheis currently able to take care of herself independently. Her daughter is in town and her son is in [place]. She has other non-biological children.  -She plan to have care given at home soon.She has Medicare and Medicaid.  7. COVID (+) in early 09/2020  -This was contracted after family gathering. She was asymptomatic and did not require treatment. She tested negative in later 09/2020.  -She has recovered well   PLAN: -I called in Phenergan and Ambien today  -Proceed with IV Fluids 566m today with phenegan in clinic  -Lab, IV Fluids and F/u on 2/11 -plan to stop Kadcyla due to poor tolerance -will reach out to her cardiologist Dr. ODavina Pokefor her chest pain.   No problem-specific Assessment & Plan notes found for this encounter.   No orders of the defined types were placed in this encounter.  All questions were answered. The patient knows to call the clinic with any problems, questions or concerns. No barriers to learning was detected. The total time spent in the appointment was 30 minutes.     YTruitt Merle MD 10/28/2020   I, AJoslyn Devon am acting as scribe for YTruitt Merle MD.   I have reviewed the above documentation for accuracy and completeness, and I agree with the above.

## 2020-10-28 NOTE — Progress Notes (Signed)
Cheryl Blankenship   [phone] [phone]) [phone]   Clinic Follow up Note   Patient Care Team: [name], PA-C as PCP - General (Internal Medicine) [name], RN as Oncology Nurse Navigator [name], RN as Oncology Nurse Navigator [name], MD as Consulting Physician (General Surgery) [name], MD as Consulting Physician (Hematology) [name], MD as Consulting Physician (Radiation Oncology) [name], [name], MD as Consulting Physician (Cardiology)  Date of Service:  10/30/2020  CHIEF COMPLAINT:  F/u of left breast cancer  SUMMARY OF ONCOLOGIC HISTORY: Oncology History Overview Note  Cancer Staging Malignant neoplasm of upper-outer quadrant of left breast in female, estrogen receptor positive ([place]) Staging form: Breast, AJCC 8th Edition - Clinical stage from 07/16/2020: Stage IB (cT2, cN1, cM0, G2, ER+, PR+, HER2+) - Signed by [name], MD on 07/21/2020    Malignant neoplasm of upper-outer quadrant of left breast in female, estrogen receptor positive ([place])  07/01/2020 Mammogram   IMPRESSION: 1. There is a 12 cm span of highly suspicious calcifications involving the upper outer and upper inner quadrants of the left breast. On ultrasound, within this area of calcifications, there are confluent masses at 1 o'clock spanning at least 4.5 cm. An additional suspicious mass is seen in the left breast at 2 o'clock.  -Ultrasound targeted to the left breast at 1 o'clock, 5 cm from the nipple demonstrates a broad area of hypoechoic irregular tissue/confluent masses spanning at least 4.5 cm. There is a separate oval hypoechoic mass with indistinct margins at 2 o'clock, 7 cm from the nipple measuring 0.9 x 0.4 x 0.9 cm. Ultrasound of the left axilla demonstrates 2 abnormally thickened lymph nodes with cortices measuring 6-7 mm. There is an additional lymph node with a borderline cortex of 4 mm.   2. There are 2 lymph  nodes with thickened cortices in the left axilla, and an additional lymph node with a borderline thickened cortex.   07/16/2020 Cancer Staging   Staging form: Breast, AJCC 8th Edition - Clinical stage from 07/16/2020: Stage IIA (cT3, cN1, cM0, G2, ER+, PR+, HER2+) - Signed by [name], MD on 08/05/2020   07/16/2020 Initial Biopsy   Diagnosis 1. 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 Breast, left, needle core biopsy, 1 o'clock - INVASIVE DUCTAL CARCINOMA - SEE COMMENT 3. Lymph node, biopsy - METASTATIC CARCINOMA INVOLVING A LYMPH NODE - SEE COMMENT Microscopic Comment 1. Based on the biopsy, the ductal carcinoma in situ has a comedo pattern, high nuclear grade and measures 0.7 cm in greatest linear extent. Prognostic markers (ER/PR) are pending and will be reported in an addendum. 2. Based on the biopsy, the carcinoma appears Nottingham grade 2 of 3 and measures 1.5 cm in greatest linear extent. Prognostic markers (ER/PR/ki-67/HER2) are pending and will be reported in an addendum. Dr. Saralyn Blankenship reviewed the case and agrees with the above diagnosis. These results were called to [facility] on July 17, 2020. 3. Based on the biopsy, the carcinoma appears measures 1.0 cm in greatest linear extent. Prognostic markers (ER/PR/ki-67/HER2) are pending and will be reported in an addendum.   07/16/2020 Receptors her2   1. PROGNOSTIC INDICATORS Results: IMMUNOHISTOCHEMICAL AND MORPHOMETRIC ANALYSIS PERFORMED MANUALLY Estrogen Receptor: 30%, POSITIVE, STRONG STAINING INTENSITY Progesterone Receptor: 5%, POSITIVE, STRONG STAINING INTENSITY  2. PROGNOSTIC INDICATORS Results: IMMUNOHISTOCHEMICAL AND MORPHOMETRIC ANALYSIS PERFORMED MANUALLY The tumor cells are POSITIVE  for Her2 (3+). Estrogen Receptor: 85%, POSITIVE, STRONG STAINING INTENSITY Progesterone  Receptor: 60%, POSITIVE, STRONG STAINING INTENSITY Proliferation Marker Ki67: 25%  3. PROGNOSTIC INDICATORS Results: IMMUNOHISTOCHEMICAL AND MORPHOMETRIC ANALYSIS PERFORMED MANUALLY The tumor cells are POSITIVE for Her2 (3+). Estrogen Receptor: 95%, POSITIVE, STRONG STAINING INTENSITY Progesterone Receptor: 45%, POSITIVE, STRONG STAINING INTENSITY Proliferation Marker Ki67: 30%   07/20/2020 Initial Diagnosis   Malignant neoplasm of upper-outer quadrant of left breast in female, estrogen receptor positive ([place])   07/31/2020 Echocardiogram   Baseline Echo  IMPRESSIONS     1. Left ventricular ejection fraction, by estimation, is 65 to 70%. The  left ventricle has normal function. The left ventricle has no regional  wall motion abnormalities. Left ventricular diastolic parameters are  indeterminate. The average left  ventricular global longitudinal strain is -17.1 %.   2. Right ventricular systolic function is normal. The right ventricular  size is normal. There is normal pulmonary artery systolic pressure.   3. The mitral valve is normal in structure. Trivial mitral valve  regurgitation. No evidence of mitral stenosis.   4. The aortic valve is normal in structure. Aortic valve regurgitation is  not visualized. No aortic stenosis is present.    07/31/2020 Breast MRI   IMPRESSION: 1. Extensive non mass enhancement within the left breast extending from the nipple to the chest wall. Overall findings are most compatible with extensive malignancy involving the majority of the left breast. 2. Indeterminate enhancing mass within the upper inner right breast. 3. Three morphologically abnormal lymph nodes within the left axilla. One of these nodes has been biopsied compatible with metastatic adenopathy.     08/03/2020 Imaging   Bone Scan whole body  IMPRESSION: Increased radiotracer uptake in the mid to lower lumbar regions of uncertain etiology. Correlation with radiography to  assess for potential arthropathy in these areas advised. Increased uptake in major joints is likely of arthropathic etiology. Increased uptake in the mid face is likely due to paranasal sinus disease. Distribution of radiotracer uptake in bony structures elsewhere unremarkable.   08/04/2020 Procedure   PAC placement by Dr [name]   08/05/2020 - 10/28/2020 Neo-Adjuvant Chemotherapy   Neoadjuvant THP q3weeks for 4-6 cycles starting 08/05/20, followed by Herceptin or Kadcyla q3weeks to complete 1 year of treatment. Stopped THP after 1 dose due to poor toleration       -I switched her to [place] starting 08/25/20. Stopped after C2 due to poor toleration (10/28/20)   08/07/2020 Imaging   CT CAP  IMPRESSION: 2.7 cm mass in the upper outer left breast, likely corresponding to the patient's known primary breast neoplasm.   8 mm short axis left axillary node, suspicious for nodal metastasis in this clinical context.   No findings suspicious for distant metastasis.   Endometrium is mildly prominent, measuring 11 mm. Correlate for vaginal bleeding and consider pelvic ultrasound for further evaluation if clinically warranted.   08/11/2020 Pathology Results   Diagnosis Breast, right, needle core biopsy, right breast - PAPILLARY LESION - SEE COMMENT Microscopic Comment The biopsy consists of a papillary lesion with a focal area of adenosis. Immunohistochemistry (SMM, calponin and p63) shows retention of the myoepithelial cell layer in the focus of adenosis. 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 These results were called to [facility] on August 12, 2020.      CURRENT THERAPY:  -Neo-adjuvantKadcyla q3weeks starting 08/25/20, D/c after C2 due to poor toleration. PENDING antiestrogen and Herceptin therapy.  -Symptoms Management  INTERVAL HISTORY:  Cheryl Blankenship is here for a follow up. She presents to the clinic alone. I called her  husband on the phone.  She notes she went to [facility] ED by ambulance early this morning due to her unbearable pain. She notes  Her family was not allowed in the ambulance. She was given dilaudid in the ER. She notes this has improved her pain some, but not completely. The ED staff brought her to the cancer center for her appointment this morning.  Her husband Cheryl Blankenship was added to her chart information with her permission. She notes her son Cheryl Blankenship and her husband is the one who picked up her Hydrocodone on 10/27/20 that was given by ED. She notes her husband has helped with her medications at time, but he does not want her to take as much medication. She notes she tried only 1 tablet of hydrocodone. She plans to bring her husband and her medication with her at next visit. She notes she has not received Ambien yet, as her daughter should have picked it up.    REVIEW OF SYSTEMS:   Constitutional: Denies fevers, chills or abnormal weight loss Eyes: Denies blurriness of vision Ears, nose, mouth, throat, and face: Denies mucositis or sore throat Respiratory: Denies cough, dyspnea or wheezes Cardiovascular: Denies palpitation, chest discomfort or lower extremity swelling Gastrointestinal:  Denies nausea, heartburn or change in bowel habits Skin: Denies abnormal skin rashes Lymphatics: Denies new lymphadenopathy or easy bruising Neurological:Denies numbness, tingling or new weaknesses Behavioral/Psych: Mood is stable, no new changes  All other systems were reviewed with the patient and are negative.  MEDICAL HISTORY:  Past Medical History:  Diagnosis Date  . Anxiety   . Breast cancer ([place])   . CHF (congestive heart failure) ([place])   . Diabetes mellitus without complication ([place])    type 2  . Hypertension     SURGICAL HISTORY: Past Surgical History:  Procedure Laterality Date  . APPENDECTOMY    . CHOLECYSTECTOMY    . COLONOSCOPY    . EYE SURGERY     cataracts  . MULTIPLE TOOTH EXTRACTIONS     . PORTACATH PLACEMENT N/A 08/04/2020   Procedure: INSERTION PORT-A-CATH WITH ULTRASOUND GUIDANCE;  Surgeon: [name], MD;  Location: [facility];  Service: General;  Laterality: N/A;  . STOMACH SURGERY      I have reviewed the social history and family history with the patient and they are unchanged from previous note.  ALLERGIES:  is allergic to coconut (cocos nucifera) allergy skin test, hydrochlorothiazide, lisinopril, and coconut oil.  MEDICATIONS:  No current facility-administered medications for this visit.   No current outpatient medications on file.   Facility-Administered Medications Ordered in Other Visits  Medication Dose Route Frequency Provider Last Rate Last Admin  . acetaminophen (TYLENOL) tablet 500 mg  500 mg Oral Q4H PRN Harold Hedge, MD      . albuterol (VENTOLIN HFA) 108 (90 Base) MCG/ACT inhaler 1-2 puff  1-2 puff Inhalation Q4H PRN Harold Hedge, MD      . amLODipine (NORVASC) tablet 10 mg  10 mg Oral Daily Harold Hedge, MD      . aspirin chewable tablet 81 mg  81 mg Oral Daily Harold Hedge, MD      . carvedilol (COREG) tablet 25 mg  25 mg Oral BID WC Harold Hedge, MD      . DULoxetine (CYMBALTA) DR capsule 30 mg  30 mg Oral Daily Marva Panda  E, MD      . enoxaparin (LOVENOX) injection 40 mg  40 mg Subcutaneous Q24H Marva Panda E, MD      . insulin aspart (novoLOG) injection 0-15 Units  0-15 Units Subcutaneous TID WC Harold Hedge, MD      . insulin aspart (novoLOG) injection 0-5 Units  0-5 Units Subcutaneous QHS Harold Hedge, MD      . insulin glargine (LANTUS) injection 25 Units  25 Units Subcutaneous QHS Harold Hedge, MD      . labetalol (NORMODYNE) injection 10 mg  10 mg Intravenous Q2H PRN Harold Hedge, MD      . ondansetron [facility]) injection 4 mg  4 mg Intravenous Q6H PRN Harold Hedge, MD      . pantoprazole (PROTONIX) injection 40 mg  40 mg Intravenous Daily Marva Panda E, MD      . rosuvastatin (CRESTOR) tablet 5 mg  5 mg Oral QHS  Harold Hedge, MD      . torsemide [facility]) tablet 40 mg  40 mg Oral Daily Harold Hedge, MD        PHYSICAL EXAMINATION: ECOG PERFORMANCE STATUS: 3 - Symptomatic, >50% confined to bed  Vitals:   10/30/20 0832  BP: (!) 152/86  Pulse: 76  Resp: 16  Temp: 97.7 F (36.5 C)  SpO2: 92%   Filed Weights   10/30/20 0832  Weight: 207 lb 12.8 oz (94.3 kg)    Due to COVID19 we will limit examination to appearance. Patient had no complaints.  GENERAL:alert, no distress and comfortable SKIN: skin color normal, no rashes or significant lesions EYES: normal, Conjunctiva are pink and non-injected, sclera clear  NEURO: alert & oriented x 3 with fluent speech   LABORATORY DATA:  I have reviewed the data as listed CBC Latest Ref Rng & Units 10/30/2020 10/28/2020 10/26/2020  WBC 4.0 - 10.5 K/uL 6.7 6.5 6.9  Hemoglobin 12.0 - 15.0 g/dL 13.1 12.3 12.9  Hematocrit 36.0 - 46.0 % 39.8 37.6 40.1  Platelets 150 - 400 K/uL 155 117(L) 104(L)     CMP Latest Ref Rng & Units 10/28/2020 10/26/2020 10/19/2020  Glucose 70 - 99 mg/dL 297(H) 241(H) 223(H)  BUN 8 - 23 mg/dL 11 21 29(H)  Creatinine 0.44 - 1.00 mg/dL 1.03(H) 1.33(H) 1.35(H)  Sodium 135 - 145 mmol/L 135 141 138  Potassium 3.5 - 5.1 mmol/L 3.7 3.6 4.1  Chloride 98 - 111 mmol/L 97(L) 100 103  CO2 22 - 32 mmol/L _0 Calcium 8.9 - 10.3 mg/dL 10.7(H) 10.6(H) 10.4(H)  Total Protein 6.5 - 8.1 g/dL 7.9 7.9 7.0  Total Bilirubin 0.3 - 1.2 mg/dL 0.6 0.8 0.4  Alkaline Phos 38 - 126 U/L 71 67 63  AST 15 - 41 U/L 33 38 13(L)  ALT 0 - 44 U/L _1 RADIOGRAPHIC STUDIES: I have personally reviewed the radiological images as listed and agreed with the findings in the report. No results found.   ASSESSMENT & PLAN:  Lasasha Brophy is a 70 y.o. female with   1.Malignant neoplasm of upper-outer quadrant of left breast, StageII, c(T2N1M0), ER+/PR+/HER2+, GradeII -She was diagnosed in 06/2020.She has 12cm calcification and 4.5cm mass in  her left breast with2 abnormalLN. 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 -HerMRI Breast from 07/31/20 shows very extensive disease in her left breast and 3 abnormal LNs in  the left axilla. There isindeterminate enhancing57mmass within the upper inner right breast. -Her 08/11/20 right breast biopsy showed benign papillary lesion. -Herbone scan from 08/03/20 shows uptake in her lumbar spine, but nonspecific. She has known lumbar back pain from arthritis. Her CT CAP from 08/07/20 showed known breast masses and left axillary LN andnodistant metastasis. She has mild thickening of endometrium, pt denies vaginal bleeding. -Given theextensivedisease in her left breast,shewillneedmastectomy with Dr TCindra Evesthe only way to cure her cancer. -She startedneoadjuvantchemoTHPq3weekson11/17/21. Due to poor toleration after C1, she was switched to [employer] Systemon 09/04/20.Plan for 5-6 cycles in total. C2 postponed due to COVID (+) in early 09/2020.  -She tolerated first Kadcyla moderately well with nausea and diarrhea. However, she poorly tolerated C2 with persistent N&V, Constipation, Abdominal pain, chest pain, insomnia. Will d/c Kadcyla.  -I discussed starting antiestrogen therapy sooner and continuing anti-HER2 treatment with Herceptin q3weeks while we consult on when to proceed with breast surgery. She is open to this. Plan to start after her pain is controlled.    2. Symptoms Management: General body pain, Diarrhea, N&V  -With first week on THP she hadgeneral body pain, mainly in legs, shoulders and neck, diarrhea, N&V, Fatigue.  -She continues to have upper leg pain with cramping. She notes she is taking her potassium. I recommend she increase her Cymbalta. -She had been using imodium and lomotil for her diarrhea, but has not taken lately.  -Her THP was changed to [place]  09/04/20.She moderately tolerated first dose of Kadcyla but second cycle she had 10 days of significant fatigue and 7-10 days later experienced intractable N&V, abdominal pain, constipation, insomnia and chest pain.  -For insomnia melatonin has not helped. I previously called in short course Ambien to only take as needed, not nightly. This has not been picked up yet.  -For nausea she has zofran and compazine, but not enough. She is struggling to keep anything down including pills. She previously prescribed her phenergan.  -For severe constipation, she can use fleet enema and Doculax suppository. She can otherwise use Miralax as needed going forward. I recommend low residual diet for now.  -For pain she has tried Norco and Oxycodone, most recently was prescribed Norco on ED 10/26/20 discharge.  -She and her husband have struggled to manage her pain at home with medication. I discussed [facility] hospital admission to control her pain and review better pain management education with her and her family. She is agreeable. Will admit today (10/30/20).   3. Chest pain  -For the past 2 days (10/26/20) she has been having new chest pain and underarm burning sensation. Pt notes her Urgent care EKG was abnormal, but was not of concern on ER work up. Her 10/26/20 ED CT Angio was negative but she did have slightly elevated troponin. -I discussed her chest pain with her cardiologist Dr. OAudie Boxand he notes this is not related to her heart condition. WE suspect this pain is related severe Acid reflux or muscular chest pain. I will start her on PPI such as Protonix.    4. Comorbidities: CHF,poorly controlledDM with neuropathy, HTN, Arthritis in hips, Elevated Cr, LE edema   -Due to Nausea and stomach pain fromJardiance, she takes Zofran as needed.  -Continue medications and f/u with PCP and Cardiologist Dr. OMarisue Ivan  -She had 2 falls in 2021due to multiple medications and poor sleep. She ambulates with cane.  -Pt is on Lasix  which she notes leads to urinary incontinence. She wears depends for this. -  Her baseline ECHO from 07/31/20 was normal. Will monitor on Herceptin. -She has had increased LE edema from her recent CHF exacerbation. She will continue to f/u with Cardiologist.  5. Genetic testing  -Given her mild family history of cancer (mother with Ovarian, niece and 2 cousins with cancer). She agreed to proceed but has not proceeded yet.   6. Social Support -She does have decreased memory, she is on medication. Her family help her with history. -[place] in an apartmentwith other older women. Sheis currently able to take care of herself independently. Her daughter is in town and her son is in [place]. She has other non-biological children. She notes having a husband Cheryl Blankenship. -She plan to have care given at home soon.She has Medicare and Medicaid.  7. COVID (+) in early 09/2020. She was asymptomatic and did not require treatment. She tested negative in later 09/2020. She has recovered well  PLAN: -Due to her intractable N/V and uncontrolled chest pain, will admit to [facility], Dr. Neysa Bonito has kindly agreed to admit her  -I called [facility] Dr. Alessandra Bevels and he will see pt when she gets admitted today  -I called pt's husband and daughter today    No problem-specific Assessment & Plan notes found for this encounter.   No orders of the defined types were placed in this encounter.  All questions were answered. The patient knows to call the clinic with any problems, questions or concerns. No barriers to learning was detected. The total time spent in the appointment was 40 minutes.     [name], MD 10/30/2020   I, Joslyn Devon, am acting as scribe for [name], MD.   I have reviewed the above documentation for accuracy and completeness, and I agree with the above.

## 2020-10-28 NOTE — Patient Instructions (Signed)

## 2020-10-29 ENCOUNTER — Ambulatory Visit: Payer: Medicare HMO | Admitting: Hematology

## 2020-10-29 ENCOUNTER — Telehealth: Payer: Self-pay

## 2020-10-29 NOTE — Telephone Encounter (Signed)
Cheryl Blankenship called stating that her mom is still having pain and she is refusing to eating.  She denies nausea.  She states her mom does not have any pain medication.  Hydrocodone was sent by ED physician on 2/7 and that was picked up by Ms Kerstein's husband per Nila Nephew

## 2020-10-29 NOTE — Telephone Encounter (Signed)
Spoke with Pts daughter about most recent lab results pt and family were aware s/p in office visit yesterday nothing further call ended

## 2020-10-29 NOTE — Telephone Encounter (Signed)
-----   Message from Alla Feeling, NP sent at 10/28/2020  3:20 PM EST ----- Please let her know calcium is elevated. If she is taking calcium supplement please hold it and hydrate.  Thanks, Regan Rakers, NP

## 2020-10-30 ENCOUNTER — Observation Stay (HOSPITAL_COMMUNITY): Payer: Medicare HMO

## 2020-10-30 ENCOUNTER — Encounter (HOSPITAL_COMMUNITY): Payer: Self-pay

## 2020-10-30 ENCOUNTER — Encounter (HOSPITAL_COMMUNITY): Payer: Self-pay | Admitting: Cardiovascular Disease

## 2020-10-30 ENCOUNTER — Other Ambulatory Visit: Payer: Self-pay

## 2020-10-30 ENCOUNTER — Encounter: Payer: Self-pay | Admitting: Hematology

## 2020-10-30 ENCOUNTER — Inpatient Hospital Stay (HOSPITAL_COMMUNITY)
Admission: AD | Admit: 2020-10-30 | Discharge: 2020-11-04 | DRG: 392 | Disposition: A | Discharge status: Home / Self Care | Payer: Medicare HMO | Source: Ambulatory Visit | Attending: Internal Medicine | Admitting: Internal Medicine

## 2020-10-30 ENCOUNTER — Inpatient Hospital Stay (HOSPITAL_BASED_OUTPATIENT_CLINIC_OR_DEPARTMENT_OTHER): Payer: Medicare HMO | Admitting: Hematology

## 2020-10-30 ENCOUNTER — Inpatient Hospital Stay: Payer: Medicare HMO

## 2020-10-30 ENCOUNTER — Emergency Department (HOSPITAL_COMMUNITY)
Admission: EM | Admit: 2020-10-30 | Discharge: 2020-10-30 | Disposition: A | Discharge status: Home / Self Care | Payer: Medicare HMO | Source: Home / Self Care | Attending: Emergency Medicine | Admitting: Emergency Medicine

## 2020-10-30 ENCOUNTER — Encounter (HOSPITAL_COMMUNITY): Payer: Self-pay | Admitting: Internal Medicine

## 2020-10-30 ENCOUNTER — Ambulatory Visit (HOSPITAL_COMMUNITY): Payer: Medicare HMO

## 2020-10-30 VITALS — BP 152/86 | HR 76 | Temp 97.7°F | Resp 16 | Ht 63.0 in | Wt 207.8 lb

## 2020-10-30 DIAGNOSIS — E876 Hypokalemia: Secondary | ICD-10-CM | POA: Diagnosis present

## 2020-10-30 DIAGNOSIS — C50412 Malignant neoplasm of upper-outer quadrant of left female breast: Secondary | ICD-10-CM

## 2020-10-30 DIAGNOSIS — Z9049 Acquired absence of other specified parts of digestive tract: Secondary | ICD-10-CM

## 2020-10-30 DIAGNOSIS — E1165 Type 2 diabetes mellitus with hyperglycemia: Secondary | ICD-10-CM | POA: Diagnosis present

## 2020-10-30 DIAGNOSIS — Z170 Estrogen receptor positive status [ER+]: Secondary | ICD-10-CM | POA: Diagnosis not present

## 2020-10-30 DIAGNOSIS — I509 Heart failure, unspecified: Secondary | ICD-10-CM | POA: Insufficient documentation

## 2020-10-30 DIAGNOSIS — Z87891 Personal history of nicotine dependence: Secondary | ICD-10-CM

## 2020-10-30 DIAGNOSIS — R110 Nausea: Secondary | ICD-10-CM

## 2020-10-30 DIAGNOSIS — Z794 Long term (current) use of insulin: Secondary | ICD-10-CM | POA: Insufficient documentation

## 2020-10-30 DIAGNOSIS — Z8041 Family history of malignant neoplasm of ovary: Secondary | ICD-10-CM

## 2020-10-30 DIAGNOSIS — Z7982 Long term (current) use of aspirin: Secondary | ICD-10-CM

## 2020-10-30 DIAGNOSIS — E785 Hyperlipidemia, unspecified: Secondary | ICD-10-CM | POA: Diagnosis present

## 2020-10-30 DIAGNOSIS — K449 Diaphragmatic hernia without obstruction or gangrene: Secondary | ICD-10-CM | POA: Diagnosis present

## 2020-10-30 DIAGNOSIS — G4700 Insomnia, unspecified: Secondary | ICD-10-CM | POA: Diagnosis present

## 2020-10-30 DIAGNOSIS — E860 Dehydration: Secondary | ICD-10-CM

## 2020-10-30 DIAGNOSIS — R112 Nausea with vomiting, unspecified: Principal | ICD-10-CM | POA: Diagnosis present

## 2020-10-30 DIAGNOSIS — Z79899 Other long term (current) drug therapy: Secondary | ICD-10-CM | POA: Insufficient documentation

## 2020-10-30 DIAGNOSIS — Z7984 Long term (current) use of oral hypoglycemic drugs: Secondary | ICD-10-CM

## 2020-10-30 DIAGNOSIS — I110 Hypertensive heart disease with heart failure: Secondary | ICD-10-CM | POA: Diagnosis present

## 2020-10-30 DIAGNOSIS — K5900 Constipation, unspecified: Secondary | ICD-10-CM | POA: Diagnosis not present

## 2020-10-30 DIAGNOSIS — I10 Essential (primary) hypertension: Secondary | ICD-10-CM

## 2020-10-30 DIAGNOSIS — Z833 Family history of diabetes mellitus: Secondary | ICD-10-CM

## 2020-10-30 DIAGNOSIS — K219 Gastro-esophageal reflux disease without esophagitis: Secondary | ICD-10-CM | POA: Diagnosis present

## 2020-10-30 DIAGNOSIS — Z8616 Personal history of COVID-19: Secondary | ICD-10-CM

## 2020-10-30 DIAGNOSIS — C50012 Malignant neoplasm of nipple and areola, left female breast: Secondary | ICD-10-CM | POA: Insufficient documentation

## 2020-10-30 DIAGNOSIS — E1140 Type 2 diabetes mellitus with diabetic neuropathy, unspecified: Secondary | ICD-10-CM | POA: Diagnosis present

## 2020-10-30 DIAGNOSIS — K2970 Gastritis, unspecified, without bleeding: Secondary | ICD-10-CM | POA: Diagnosis present

## 2020-10-30 DIAGNOSIS — G893 Neoplasm related pain (acute) (chronic): Secondary | ICD-10-CM | POA: Insufficient documentation

## 2020-10-30 DIAGNOSIS — N179 Acute kidney failure, unspecified: Secondary | ICD-10-CM | POA: Diagnosis present

## 2020-10-30 DIAGNOSIS — E119 Type 2 diabetes mellitus without complications: Secondary | ICD-10-CM | POA: Insufficient documentation

## 2020-10-30 DIAGNOSIS — I5032 Chronic diastolic (congestive) heart failure: Secondary | ICD-10-CM | POA: Diagnosis present

## 2020-10-30 DIAGNOSIS — R079 Chest pain, unspecified: Secondary | ICD-10-CM | POA: Diagnosis not present

## 2020-10-30 DIAGNOSIS — R140 Abdominal distension (gaseous): Secondary | ICD-10-CM

## 2020-10-30 DIAGNOSIS — K5909 Other constipation: Secondary | ICD-10-CM | POA: Diagnosis present

## 2020-10-30 DIAGNOSIS — T451X5A Adverse effect of antineoplastic and immunosuppressive drugs, initial encounter: Secondary | ICD-10-CM | POA: Diagnosis present

## 2020-10-30 DIAGNOSIS — Z6836 Body mass index (BMI) 36.0-36.9, adult: Secondary | ICD-10-CM

## 2020-10-30 LAB — COMPREHENSIVE METABOLIC PANEL
ALT: 49 U/L — ABNORMAL HIGH (ref 0–44)
AST: 50 U/L — ABNORMAL HIGH (ref 15–41)
Albumin: 3.7 g/dL (ref 3.5–5.0)
Alkaline Phosphatase: 60 U/L (ref 38–126)
Anion gap: 10 (ref 5–15)
BUN: 17 mg/dL (ref 8–23)
CO2: 28 mmol/L (ref 22–32)
Calcium: 10.5 mg/dL — ABNORMAL HIGH (ref 8.9–10.3)
Chloride: 99 mmol/L (ref 98–111)
Creatinine, Ser: 0.86 mg/dL (ref 0.44–1.00)
GFR, Estimated: >60 mL/min (ref >60)
Glucose, Bld: 191 mg/dL — ABNORMAL HIGH (ref 70–99)
Potassium: 3 mmol/L — ABNORMAL LOW (ref 3.5–5.1)
Sodium: 137 mmol/L (ref 135–145)
Total Bilirubin: 0.9 mg/dL (ref 0.3–1.2)
Total Protein: 7.5 g/dL (ref 6.5–8.1)

## 2020-10-30 LAB — CBC
HCT: 39.8 % (ref 36.0–46.0)
Hemoglobin: 13.1 g/dL (ref 12.0–15.0)
MCH: 27.8 pg (ref 26.0–34.0)
MCHC: 32.9 g/dL (ref 30.0–36.0)
MCV: 84.5 fL (ref 80.0–100.0)
Platelets: 155 10*3/uL (ref 150–400)
RBC: 4.71 MIL/uL (ref 3.87–5.11)
RDW: 12.7 % (ref 11.5–15.5)
WBC: 6.7 10*3/uL (ref 4.0–10.5)
nRBC: 0 % (ref 0.0–0.2)

## 2020-10-30 LAB — TROPONIN I (HIGH SENSITIVITY)
Troponin I (High Sensitivity): 12 ng/L (ref <18)
Troponin I (High Sensitivity): 12 ng/L (ref <18)

## 2020-10-30 LAB — LIPID PANEL
Cholesterol: 134 mg/dL (ref 0–200)
HDL: 38 mg/dL — ABNORMAL LOW (ref >40)
LDL Cholesterol: 63 mg/dL (ref 0–99)
Total CHOL/HDL Ratio: 3.5 RATIO
Triglycerides: 165 mg/dL — ABNORMAL HIGH (ref <150)
VLDL: 33 mg/dL (ref 0–40)

## 2020-10-30 LAB — GLUCOSE, CAPILLARY
Glucose-Capillary: 177 mg/dL — ABNORMAL HIGH (ref 70–99)
Glucose-Capillary: 175 mg/dL — ABNORMAL HIGH (ref 70–99)

## 2020-10-30 LAB — MAGNESIUM: Magnesium: 1.8 mg/dL (ref 1.7–2.4)

## 2020-10-30 LAB — HEMOGLOBIN A1C
Hgb A1c MFr Bld: 10.3 % — ABNORMAL HIGH (ref 4.8–5.6)
Mean Plasma Glucose: 248.91 mg/dL

## 2020-10-30 IMAGING — DX DG ABDOMEN 1V
2 series · 2 of 2 positions shown · non-contrast
Comparison: None.

CLINICAL DATA: Abdominal pain and distension.

EXAM:
ABDOMEN - 1 VIEW

[abdomen kub (1 of 2)]
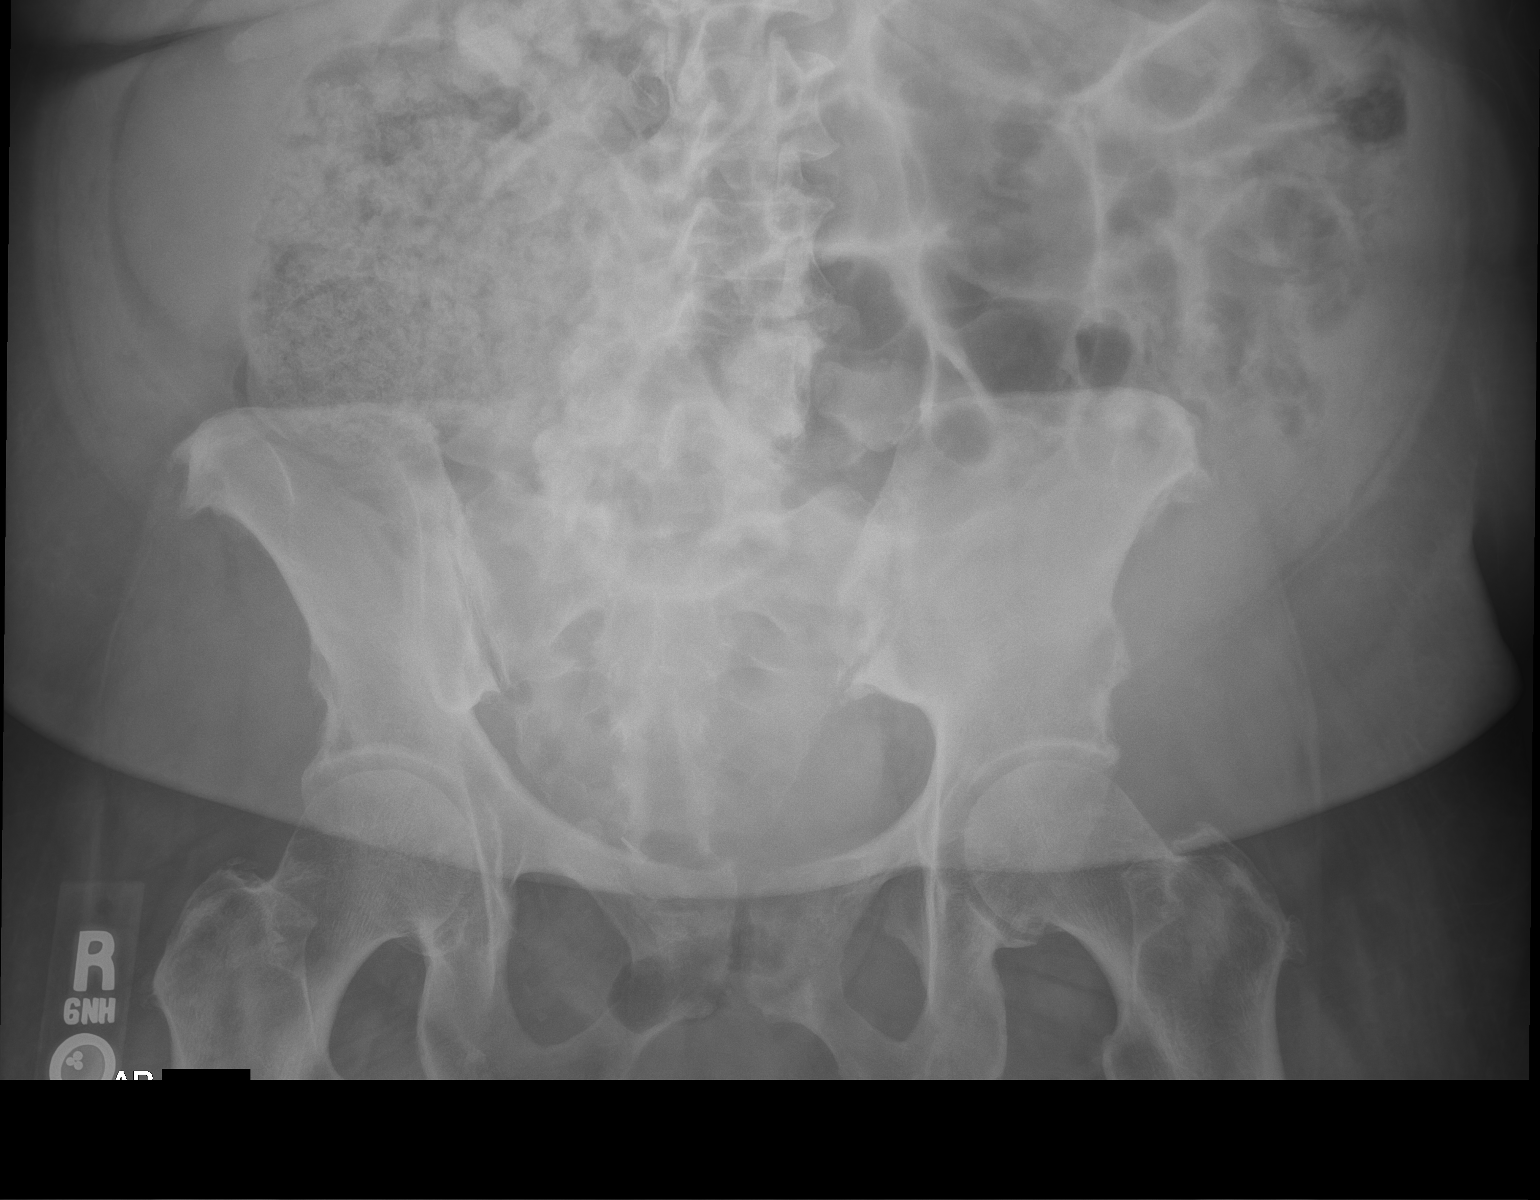

[abdomen kub (2 of 2)]
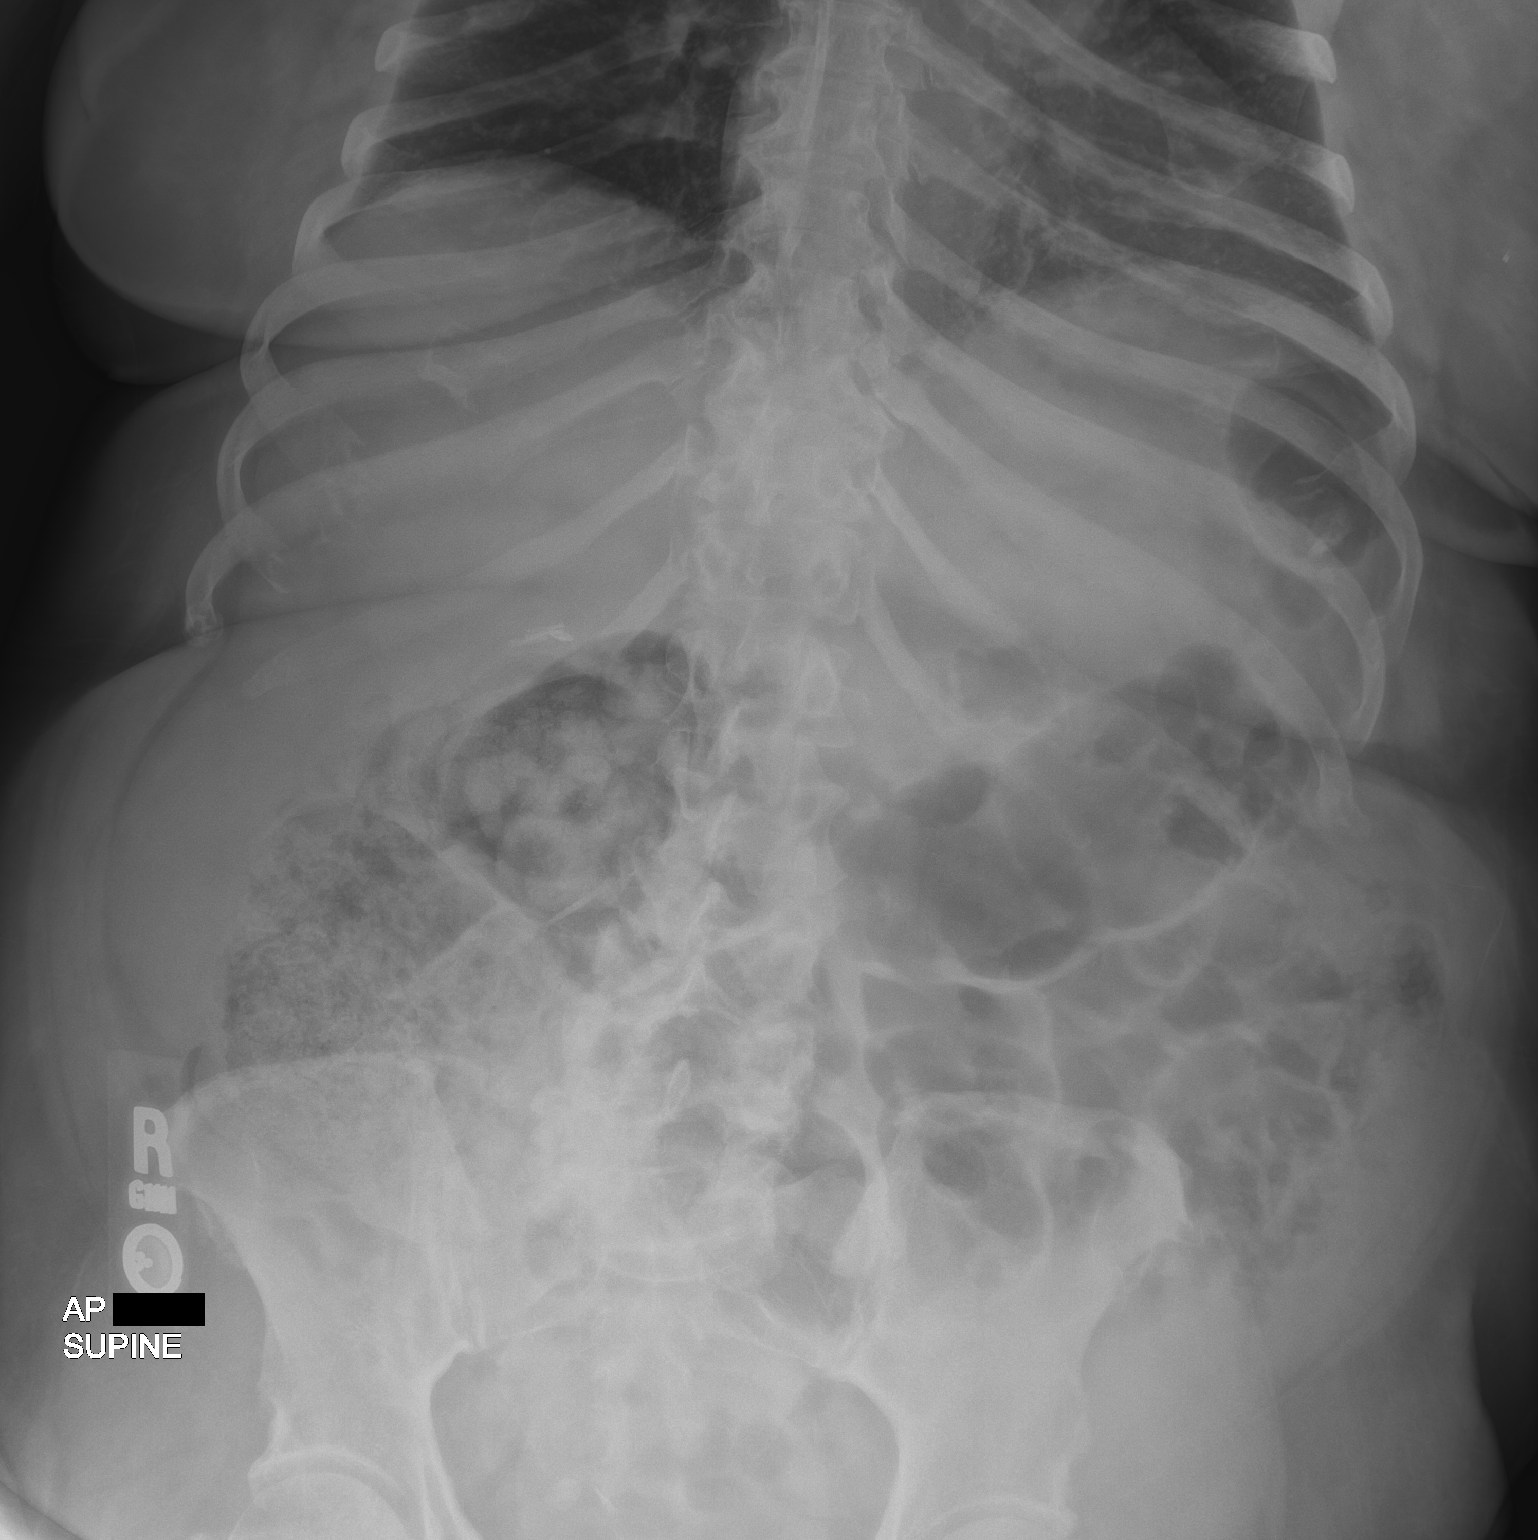

[2 of 2 positions shown; findings below may reference images not displayed]

FINDINGS: The visualized lung bases are grossly clear.  No pleural effusions.

Moderate air throughout the small bowel and colon which may suggest
an ileus. No free air. The bony structures are intact.
IMPRESSION: Possible ileus.

## 2020-10-30 MED ORDER — IOHEXOL 9 MG/ML PO SOLN
ORAL | Status: AC
  Filled 2020-10-30: qty 1000

## 2020-10-30 MED ORDER — ENOXAPARIN SODIUM 40 MG/0.4ML ~~LOC~~ SOLN
40.0000 mg | SUBCUTANEOUS | Status: DC
  Administered 2020-10-30 – 2020-11-03 (×5): 40 mg via SUBCUTANEOUS
  Filled 2020-10-30 (×6): qty 0.4

## 2020-10-30 MED ORDER — SODIUM CHLORIDE 0.9 % IV SOLN
Freq: Once | INTRAVENOUS | Status: AC
  Filled 2020-10-30: qty 250

## 2020-10-30 MED ORDER — CARVEDILOL 25 MG PO TABS
25.0000 mg | ORAL_TABLET | Freq: Two times a day (BID) | ORAL | Status: DC
  Administered 2020-10-30 – 2020-11-04 (×10): 25 mg via ORAL
  Filled 2020-10-30 (×11): qty 1

## 2020-10-30 MED ORDER — ACETAMINOPHEN 500 MG PO TABS
500.0000 mg | ORAL_TABLET | ORAL | Status: DC | PRN
  Administered 2020-10-30 – 2020-11-03 (×7): 500 mg via ORAL
  Filled 2020-10-30 (×7): qty 1

## 2020-10-30 MED ORDER — PANTOPRAZOLE SODIUM 40 MG IV SOLR
40.0000 mg | Freq: Every day | INTRAVENOUS | Status: DC
  Administered 2020-10-30 – 2020-11-04 (×6): 40 mg via INTRAVENOUS
  Filled 2020-10-30 (×7): qty 40

## 2020-10-30 MED ORDER — ALBUTEROL SULFATE HFA 108 (90 BASE) MCG/ACT IN AERS
1.0000 | INHALATION_SPRAY | RESPIRATORY_TRACT | Status: DC | PRN
  Filled 2020-10-30: qty 6.7

## 2020-10-30 MED ORDER — HYDROMORPHONE HCL 2 MG/ML IJ SOLN
2.0000 mg | Freq: Once | INTRAMUSCULAR | Status: AC
  Administered 2020-10-30: 2 mg via INTRAMUSCULAR
  Filled 2020-10-30: qty 1

## 2020-10-30 MED ORDER — ONDANSETRON 8 MG PO TBDP
8.0000 mg | ORAL_TABLET | Freq: Once | ORAL | Status: AC
  Administered 2020-10-30: 8 mg via ORAL
  Filled 2020-10-30: qty 1

## 2020-10-30 MED ORDER — INSULIN ASPART 100 UNIT/ML ~~LOC~~ SOLN: 0.0000 [IU] | Freq: Every day | SUBCUTANEOUS | Status: DC

## 2020-10-30 MED ORDER — ASPIRIN 81 MG PO CHEW
81.0000 mg | CHEWABLE_TABLET | Freq: Every day | ORAL | Status: DC
  Administered 2020-10-30 – 2020-11-04 (×6): 81 mg via ORAL
  Filled 2020-10-30 (×6): qty 1

## 2020-10-30 MED ORDER — INSULIN ASPART 100 UNIT/ML ~~LOC~~ SOLN
0.0000 [IU] | Freq: Three times a day (TID) | SUBCUTANEOUS | Status: DC
  Administered 2020-10-30: 3 [IU] via SUBCUTANEOUS
  Administered 2020-10-31 – 2020-11-03 (×4): 2 [IU] via SUBCUTANEOUS

## 2020-10-30 MED ORDER — LABETALOL HCL 5 MG/ML IV SOLN
10.0000 mg | INTRAVENOUS | Status: DC | PRN
  Filled 2020-10-30: qty 4

## 2020-10-30 MED ORDER — INSULIN GLARGINE 100 UNIT/ML ~~LOC~~ SOLN
25.0000 [IU] | Freq: Every day | SUBCUTANEOUS | Status: DC
  Administered 2020-10-30 – 2020-11-03 (×5): 25 [IU] via SUBCUTANEOUS
  Filled 2020-10-30 (×5): qty 0.25

## 2020-10-30 MED ORDER — AMLODIPINE BESYLATE 10 MG PO TABS
10.0000 mg | ORAL_TABLET | Freq: Every day | ORAL | Status: DC
  Administered 2020-10-30 – 2020-11-04 (×6): 10 mg via ORAL
  Filled 2020-10-30 (×6): qty 1

## 2020-10-30 MED ORDER — SODIUM CHLORIDE 0.9 % IV SOLN
25.0000 mg | Freq: Once | INTRAVENOUS | Status: AC
  Administered 2020-10-30: 25 mg via INTRAVENOUS
  Filled 2020-10-30: qty 1

## 2020-10-30 MED ORDER — TORSEMIDE 20 MG PO TABS
40.0000 mg | ORAL_TABLET | Freq: Every day | ORAL | Status: DC
  Administered 2020-10-30 – 2020-11-01 (×3): 40 mg via ORAL
  Filled 2020-10-30 (×4): qty 2

## 2020-10-30 MED ORDER — DULOXETINE HCL 30 MG PO CPEP
30.0000 mg | ORAL_CAPSULE | Freq: Every day | ORAL | Status: DC
  Administered 2020-10-30 – 2020-10-31 (×2): 30 mg via ORAL
  Filled 2020-10-30 (×2): qty 1

## 2020-10-30 MED ORDER — ONDANSETRON HCL 4 MG/2ML IJ SOLN
4.0000 mg | Freq: Four times a day (QID) | INTRAMUSCULAR | Status: DC | PRN
  Administered 2020-10-31 (×2): 4 mg via INTRAVENOUS
  Filled 2020-10-30 (×3): qty 2

## 2020-10-30 MED ORDER — PROMETHAZINE HCL 25 MG/ML IJ SOLN: 25.0000 mg | Freq: Once | INTRAMUSCULAR | Status: DC

## 2020-10-30 MED ORDER — ROSUVASTATIN CALCIUM 5 MG PO TABS
5.0000 mg | ORAL_TABLET | Freq: Every day | ORAL | Status: DC
  Administered 2020-10-30 – 2020-11-03 (×5): 5 mg via ORAL
  Filled 2020-10-30 (×5): qty 1

## 2020-10-30 MED ORDER — POTASSIUM CHLORIDE 10 MEQ/100ML IV SOLN
10.0000 meq | INTRAVENOUS | Status: AC
  Administered 2020-10-30 (×4): 10 meq via INTRAVENOUS
  Filled 2020-10-30 (×3): qty 100

## 2020-10-30 NOTE — ED Triage Notes (Signed)
Pt to ED by EMS from home with c/o generalized body pain, abdominal pain and associated nausea. Pt was seen here on 2/7 from the same. Pt states she is waiting on her pharmacy to fill her prescriptions and deliver them to her. Pt is scheduled to see her PCP today for the same. Pt is a breast cancer pt currently undergoing chemo. Arrives A+O, BP is elevated. All other VSS, NADN.

## 2020-10-30 NOTE — Progress Notes (Signed)
DISCONTINUE OFF PATHWAY REGIMEN - Breast   [id number]:Ado-Trastuzumab Emtansine 3.6 mg/kg IV D1 q21 Days:   A cycle is every 21 days:     Ado-trastuzumab emtansine   **Always confirm dose/schedule in your pharmacy ordering system**  REASON: Toxicities / Adverse Event PRIOR TREATMENT: Off Pathway: Ado-Trastuzumab Emtansine 3.6 mg/kg IV D1 q21 Days TREATMENT RESPONSE: Unable to Evaluate  START OFF PATHWAY REGIMEN - Breast   [id number]:Trastuzumab and hyaluronidase-oysk 600 mg/10,000 units SUBQ D1 q21 Days:   A cycle is every 21 days:     Trastuzumab and hyaluronidase-oysk   **Always confirm dose/schedule in your pharmacy ordering system**  Patient Characteristics: Preoperative or Nonsurgical Candidate (Clinical Staging), Neoadjuvant Therapy followed by Surgery, Invasive Disease, Chemotherapy, HER2 Positive, ER Positive Therapeutic Status: Preoperative or Nonsurgical Candidate (Clinical Staging) AJCC M Category: cM0 AJCC Grade: G2 Breast Surgical Plan: Neoadjuvant Therapy followed by Surgery ER Status: Positive (+) AJCC 8 Stage Grouping: IIA HER2 Status: Positive (+) AJCC T Category: cT3 AJCC N Category: cN1 PR Status: Positive (+) Intent of Therapy: Curative Intent, Discussed with Patient

## 2020-10-30 NOTE — H&P (Signed)
History and Physical        Hospital Admission Note Date: 10/30/2020  Patient name: Cheryl Blankenship Medical record number: [id number] Date of birth: 1951-04-07 Age: 70 y.o. Gender: female  PCP: [name], PA-C    Chief Complaint    No chief complaint on file.     HPI:   This is a 70 year old female with past medical history of breast cancer, asymptomatic COVID-19 infection early January 2022 that did not require treatment, type 2 diabetes with neuropathy, hypertension, CHF who was a direct admit from the cancer center for intractable chest pain for the past week.  She was seen in the ED on 10/26/2020 for chest wall pain which was present for a week at that time and work-up was negative for cardiac ischemia.  She was given a prescription for hydrocodone but did not yet fill the prescription and her pain had since worsened.  She continued to have pain across her upper chest rated 10/10 described as burning in nature worse with movement or palpation and presented to the ED this a.m. for the symptoms with associated nausea.  CTA chest was negative but did have slightly elevated troponin.  She was given Dilaudid IM with improvement in her symptoms and was discharged in stable condition with plans to follow-up at her already scheduled oncology appointment at 8 AM this morning.  Her oncologist discussed the pain with her cardiologist and this was not deemed to be cardiogenic in nature.  It was thought her pain was more related to severe acid reflux or musculoskeletal in nature but due to her intractable pain and difficulty controlling it at home and was given Phenergan in the office.  Her oncologist asked to have her admitted to the hospital for further evaluation.  Dr. Burr Medico had already consulted Dr. Alessandra Bevels, [facility], prior to arrival who will see in consult.  Currently, patient  is a poor historian as she is somewhat confused after receiving Phenergan prior to arrival but states that she has had constipation, abdominal bloating, nausea and vomiting for the past several days.    There were no vitals filed for this visit.   Review of Systems:  Review of Systems  All other systems reviewed and are negative.   Medical/Social/Family History   Past Medical History: Past Medical History:  Diagnosis Date  . Anxiety   . Breast cancer ([place])   . CHF (congestive heart failure) ([place])   . Diabetes mellitus without complication ([place])    type 2  . Hypertension     Past Surgical History:  Procedure Laterality Date  . APPENDECTOMY    . CHOLECYSTECTOMY    . COLONOSCOPY    . EYE SURGERY     cataracts  . MULTIPLE TOOTH EXTRACTIONS    . PORTACATH PLACEMENT N/A 08/04/2020   Procedure: INSERTION PORT-A-CATH WITH ULTRASOUND GUIDANCE;  Surgeon: [name], MD;  Location: [facility];  Service: General;  Laterality: N/A;  . STOMACH SURGERY      Medications: Prior to Admission medications   Medication Sig Start Date End Date Taking? Authorizing Provider  Accu-Chek FastClix Lancets MISC  08/14/20   [provider]  acetaminophen (TYLENOL) 500 MG tablet Take 500 mg  by mouth every 4 (four) hours as needed for mild pain.    [provider]  albuterol (VENTOLIN HFA) 108 (90 Base) MCG/ACT inhaler Inhale 1-2 puffs into the lungs every 4 (four) hours as needed for wheezing or shortness of breath. 09/01/20   Alla Feeling, NP  amLODipine (NORVASC) 10 MG tablet Take 1 tablet (10 mg total) by mouth daily. 10/09/20 09/02/21  Geralynn Rile, MD  aspirin 81 MG chewable tablet Chew 81 mg by mouth daily.     [provider]  carvedilol (COREG) 25 MG tablet Take 1 tablet (25 mg total) by mouth 2 (two) times daily with a meal. 10/09/20 01/07/21  [name], [name], MD  Cholecalciferol 25 MCG (1000 UT) capsule Take 1,000 Units by mouth daily. 06/25/20    [provider]  diphenoxylate-atropine (LOMOTIL) 2.5-0.025 MG tablet Take 1-2 tablets by mouth 4 (four) times daily as needed for diarrhea or loose stools. 09/01/20   Alla Feeling, NP  DULoxetine (CYMBALTA) 30 MG capsule Take 30 mg by mouth daily. 04/21/20   [provider]  empagliflozin (JARDIANCE) 10 MG TABS tablet Take 1 tablet (10 mg total) by mouth daily before breakfast. 10/09/20   [name], [name], MD  fluorometholone (FML) 0.1 % ophthalmic suspension Place 1 drop into both eyes daily. 06/09/20   [provider]  folic acid (FOLVITE) 1 MG tablet Take 1 tablet by mouth daily. 07/14/20   [provider]  gabapentin (NEURONTIN) 400 MG capsule Take 400 mg by mouth 3 (three) times daily.  04/21/20   [provider]  glipiZIDE (GLUCOTROL XL) 10 MG 24 hr tablet Take 10 mg by mouth daily with breakfast.  06/24/20   [provider]  HYDROcodone-acetaminophen (NORCO/VICODIN) 5-325 MG tablet Take 1 tablet by mouth every 6 (six) hours as needed for up to 15 doses for moderate pain. 10/26/20   Curatolo, Adam, DO  insulin aspart (NOVOLOG) 100 UNIT/ML injection Inject 2-10 Units into the skin 3 (three) times daily before meals. Per sliding scale:  150-199 = 2 units, 200-249 = 4 units, 250-299 = 6 units, 300-349 = 8 units, greater than350 = 10 units.    [provider]  insulin glargine (LANTUS SOLOSTAR) 100 UNIT/ML Solostar Pen Inject 35 Units into the skin 2 (two) times daily. Patient taking differently: Inject 35 Units into the skin 2 (two) times daily. Pt takes 70 units in the morning and 70 units at night. 08/27/20   Oretha Milch D, MD  lidocaine-prilocaine (EMLA) cream APPLY TO AFFECTED AREA 1 time DAILY 09/15/20   Alla Feeling, NP  losartan (COZAAR) 100 MG tablet Take 1 tablet (100 mg total) by mouth daily. 10/09/20   Geralynn Rile, MD  metFORMIN (GLUCOPHAGE) 500 MG tablet Take 500 mg by mouth 2 (two) times daily with a meal.   06/25/20   [provider]  Nutritional Supplements (FEEDING SUPPLEMENT, GLUCERNA 1.2 CAL,) LIQD Take 237 mLs by mouth 3 (three) times daily with meals.    [provider]  Omega-3 Fatty Acids (FISH OIL) 1000 MG CAPS Take 1,000 mg by mouth daily.    [provider]  omeprazole (PRILOSEC) 20 MG capsule Take 1 capsule (20 mg total) by mouth daily. 10/09/20   Geralynn Rile, MD  ondansetron (ZOFRAN-ODT) 4 MG disintegrating tablet Take 1 tablet (4 mg total) by mouth 2 (two) times daily as needed for up to 30 doses for nausea or vomiting. 10/26/20   Lennice Sites, DO  potassium chloride (KLOR-CON) 10 MEQ tablet Take 20 mEq by mouth daily. DAILY WITH LASIX    [provider]  prochlorperazine (COMPAZINE) 10 MG tablet Take 1 tablet (10 mg total) by mouth every 6 (six) hours as needed for nausea or vomiting. 10/19/20   [name], MD  promethazine (PHENERGAN) 25 MG suppository Place 1 suppository (25 mg total) rectally every 8 (eight) hours as needed for nausea or vomiting. 10/28/20   [name], MD  RESTASIS 0.05 % ophthalmic emulsion Place 1 drop into both eyes 2 (two) times daily. 07/13/20   [provider]  rosuvastatin (CRESTOR) 5 MG tablet Take 5 mg by mouth at bedtime. 06/25/20   [provider]  spironolactone (ALDACTONE) 25 MG tablet Take 1 tablet (25 mg total) by mouth daily. 10/09/20   O'NealCassie Freer, MD  tiZANidine (ZANAFLEX) 4 MG tablet Take 4 mg by mouth 2 (two) times daily as needed for muscle spasms.  06/23/20   [provider]  torsemide (DEMADEX) 20 MG tablet Take 2 tablets (40 mg total) by mouth daily. 10/09/20 01/07/21  O'NealCassie Freer, MD  vitamin E 180 MG (400 UNITS) capsule Take 400 Units by mouth daily.    [provider]  zolpidem (AMBIEN) 5 MG tablet Take 1 tablet (5 mg total) by mouth at bedtime as needed for sleep. 10/28/20   [name], MD    Allergies:   Allergies  Allergen Reactions  . Coconut  (Cocos Nucifera) Allergy Skin Test     Hives and swells mouth  . Hydrochlorothiazide Swelling    tongue  . Lisinopril Swelling    Angioedema. Tolerates Losartan.  . Coconut Oil Hives    Social History:  reports that she has quit smoking. She has a 90.00 pack-year smoking history. She has never used smokeless tobacco. She reports previous alcohol use. She reports that she does not use drugs.  Family History: Family History  Problem Relation Age of Onset  . Heart attack Sister   . Diabetes Sister   . Heart attack Mother   . Ovarian cancer Mother 51  . Heart attack Brother   . Cancer Niece        bone cancer   . Cancer Cousin        unknown type cancer   . Cancer Cousin        metastatic cancer to bone      Objective   Physical Exam: There were no vitals taken for this visit.  Physical Exam Vitals and nursing note reviewed.  Constitutional:      Appearance: She is not ill-appearing.  HENT:     Head: Normocephalic.  Eyes:     Conjunctiva/sclera: Conjunctivae normal.  Cardiovascular:     Rate and Rhythm: Normal rate and regular rhythm.  Pulmonary:     Effort: Pulmonary effort is normal.     Breath sounds: Normal breath sounds.  Abdominal:     General: There is distension.     Tenderness: There is abdominal tenderness.  Musculoskeletal:        General: No swelling or tenderness.  Neurological:     Mental Status: She is alert.     Comments: Oriented but having trouble finding words and keeping thoughts on track  Psychiatric:        Mood and Affect: Mood normal.     LABS on Admission: I have personally reviewed all the labs and imaging below    Basic Metabolic Panel: Recent Labs  Lab  10/28/20 1427 10/30/20 1513  NA 135 137  K 3.7 3.0*  CL 97* 99  CO2 30 28  GLUCOSE 297* 191*  BUN 11 17  CREATININE 1.03* 0.86  CALCIUM 10.7* 10.5*   Liver Function Tests: Recent Labs  Lab 10/28/20 1427 10/30/20 1513  AST 33 50*  ALT 27 49*  ALKPHOS 71 60   BILITOT 0.6 0.9  PROT 7.9 7.5  ALBUMIN 3.7 3.7   Recent Labs  Lab 10/26/20 2235  LIPASE 24   No results for input(s): AMMONIA in the last 168 hours. CBC: Recent Labs  Lab 10/28/20 1427 10/30/20 1513  WBC 6.5 6.7  NEUTROABS 3.2  --   HGB 12.3 13.1  HCT 37.6 39.8  MCV 84.3 84.5  PLT 117* 155   Cardiac Enzymes: No results for input(s): CKTOTAL, CKMB, CKMBINDEX, TROPONINI in the last 168 hours. BNP: Invalid input(s): POCBNP CBG: Recent Labs  Lab 10/30/20 1623  GLUCAP 177*    Radiological Exams on Admission:  DG Abd 1 View  Result Date: 10/30/2020 CLINICAL DATA:  Abdominal pain and distension. EXAM: ABDOMEN - 1 VIEW COMPARISON:  None. FINDINGS: The visualized lung bases are grossly clear.  No pleural effusions. Moderate air throughout the small bowel and colon which may suggest an ileus. No free air. The bony structures are intact. IMPRESSION: Possible ileus. Electronically Signed   By: Marijo Sanes M.D.   On: 10/30/2020 16:28      EKG: unchanged from previous tracings   A & P   Principal Problem:   Chest pain Active Problems:   Malignant neoplasm of upper-outer quadrant of left breast in female, estrogen receptor positive ([place])   Essential hypertension   Constipation   Type 2 diabetes mellitus without complication ([place])   1. Chest/abdominal pain, unlikely cardiac etiology with concern for GERD vs. obstruction a. Chest pain has been ongoing for the past week without any bump in troponin and EKG at her baseline-unlikely cardiac in nature b. abdominal x-ray c. CBC and CMP d. Protonix IV e. Continue Telemetry for tonight f. GI consulted, discussed with Dr. Floreen Comber see in consult tomorrow and recommends n.p.o. after midnight for possible procedure g. Will make Npo pending imaging   2. Altered mental status, suspect mild medication induced delirium  a. Received Dilaudid this a.m. followed by Phenergan b. Hold further opiates and  monitor  3. Chronic diastolic heart failure, not in exacerbation a. Continue home Coreg, torsemide once tolerating p.o. intake b. Holding losartan  4. Hypertension a. Holding home p.o. meds while n.p.o. for now, restart when tolerating p.o. intake b. As needed labetalol  5. Type 2 diabetes a. Hold home meds b. Lantus 25 units with sliding scale  6. Left-sided breast cancer a. Follows with Dr. Burr Medico.  Seems that plan is to start antiestrogen therapy and continue anti-HER-2 treatment after pain is controlled   DVT prophylaxis: Lovenox   Code Status: Full Code  Diet: N.p.o. Family Communication: Admission, patients condition and plan of care including tests being ordered have been discussed with the patient who indicates understanding and agrees with the plan and Code Status. Patient's daughter was updated  Disposition Plan: The appropriate patient status for this patient is OBSERVATION. Observation status is judged to be reasonable and necessary in order to provide the required intensity of service to ensure the patient's safety. The patient's presenting symptoms, physical exam findings, and initial radiographic and laboratory data in the context of their medical condition is felt to place them at decreased  risk for further clinical deterioration. Furthermore, it is anticipated that the patient will be medically stable for discharge from the hospital within 2 midnights of admission. The following factors support the patient status of observation.   " The patient's presenting symptoms include chest pain. " The physical exam findings include abdominal distention. " The initial radiographic and laboratory data are pending.    Status is: Observation  The patient remains OBS appropriate and will d/c before 2 midnights.  Dispo: The patient is from: Home              Anticipated d/c is to: Home              Anticipated d/c date is: 1 day              Patient currently is not medically  stable to d/c.   Difficult to place patient No    Consultants  . GI  Procedures  . None  Time Spent on Admission: 60 minutes    Harold Hedge, DO [facility]  10/30/2020, 5:11 PM

## 2020-10-30 NOTE — Patient Instructions (Signed)
Patient transferred to 1606.  Bedside verbal report given to [place], Therapist, sports.  All questions answered.

## 2020-10-30 NOTE — ED Provider Notes (Signed)
[facility] DEPT Provider Note: Georgena Spurling, MD, FACEP  CSN: [id number] MRN: [id number] ARRIVAL: 10/30/20 at [place]: WA20/WA20   CHIEF COMPLAINT  Chest Pain   HISTORY OF PRESENT ILLNESS  10/30/20 5:58 AM Mariann Laster Emmalena Canny is a 70 y.o. female who is undergoing treatment for cancer of the left breast. She was seen in the ED on 10/26/2020 for chest wall pain which has been present for about a week. Work-up was negative for cardiac ischemia and she was given a prescription for 15 hydrocodone tablets. She has not yet had that prescription delivered from her pharmacy and her pain has worsened. She is having pain across her upper chest which she rates as a 10 out of 10. It is burning in nature and worse with movement or palpation. She has associated nausea as well. She has an appointment with her oncologist this morning at 8 AM at the [facility].   Past Medical History:  Diagnosis Date  . Anxiety   . Breast cancer ([place])   . CHF (congestive heart failure) ([place])   . Diabetes mellitus without complication ([place])    type 2  . Hypertension     Past Surgical History:  Procedure Laterality Date  . APPENDECTOMY    . CHOLECYSTECTOMY    . COLONOSCOPY    . EYE SURGERY     cataracts  . MULTIPLE TOOTH EXTRACTIONS    . PORTACATH PLACEMENT N/A 08/04/2020   Procedure: INSERTION PORT-A-CATH WITH ULTRASOUND GUIDANCE;  Surgeon: [name], MD;  Location: [facility];  Service: General;  Laterality: N/A;  . STOMACH SURGERY      Family History  Problem Relation Age of Onset  . Heart attack Sister   . Diabetes Sister   . Heart attack Mother   . Ovarian cancer Mother 71  . Heart attack Brother   . Cancer Niece        bone cancer   . Cancer Cousin        unknown type cancer   . Cancer Cousin        metastatic cancer to bone     Social History   Tobacco Use  . Smoking status: Former Smoker    Packs/day: 3.00    Years: 30.00    Pack years: 90.00  . Smokeless tobacco:  Never Used  Vaping Use  . Vaping Use: Never used  Substance Use Topics  . 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 Drug use: Never    Prior to Admission medications   Medication Sig Start Date End Date Taking? Authorizing Provider  Accu-Chek FastClix Lancets MISC  08/14/20   [provider]  acetaminophen (TYLENOL) 500 MG tablet Take 500 mg by mouth every 4 (four) hours as needed for mild pain.    [provider]  albuterol (VENTOLIN HFA) 108 (90 Base) MCG/ACT inhaler Inhale 1-2 puffs into the lungs every 4 (four) hours as needed for wheezing or shortness of breath. 09/01/20   Alla Feeling, NP  amLODipine (NORVASC) 10 MG tablet Take 1 tablet (10 mg total) by mouth daily. 10/09/20 09/02/21  Geralynn Rile, MD  aspirin 81 MG chewable tablet Chew 81 mg by mouth daily.     [provider]  carvedilol (COREG) 25 MG tablet Take 1 tablet (25 mg total) by mouth 2 (two) times daily with a meal. 10/09/20 01/07/21  [name], [name], MD  Cholecalciferol 25 MCG (  1000 UT) capsule Take 1,000 Units by mouth daily. 06/25/20   [provider]  diphenoxylate-atropine (LOMOTIL) 2.5-0.025 MG tablet Take 1-2 tablets by mouth 4 (four) times daily as needed for diarrhea or loose stools. 09/01/20   Alla Feeling, NP  DULoxetine (CYMBALTA) 30 MG capsule Take 30 mg by mouth daily. 04/21/20   [provider]  empagliflozin (JARDIANCE) 10 MG TABS tablet Take 1 tablet (10 mg total) by mouth daily before breakfast. 10/09/20   [name], [name], MD  fluorometholone (FML) 0.1 % ophthalmic suspension Place 1 drop into both eyes daily. 06/09/20   [provider]  folic acid (FOLVITE) 1 MG tablet Take 1 tablet by mouth daily. 07/14/20   [provider]  gabapentin (NEURONTIN) 400 MG capsule Take 400 mg by mouth 3 (three) times daily.  04/21/20   [provider]  glipiZIDE (GLUCOTROL XL) 10 MG 24 hr  tablet Take 10 mg by mouth daily with breakfast.  06/24/20   [provider]  HYDROcodone-acetaminophen (NORCO/VICODIN) 5-325 MG tablet Take 1 tablet by mouth every 6 (six) hours as needed for up to 15 doses for moderate pain. 10/26/20   Curatolo, Adam, DO  insulin aspart (NOVOLOG) 100 UNIT/ML injection Inject 2-10 Units into the skin 3 (three) times daily before meals. Per sliding scale:  150-199 = 2 units, 200-249 = 4 units, 250-299 = 6 units, 300-349 = 8 units, greater than350 = 10 units.    [provider]  insulin glargine (LANTUS SOLOSTAR) 100 UNIT/ML Solostar Pen Inject 35 Units into the skin 2 (two) times daily. Patient taking differently: Inject 35 Units into the skin 2 (two) times daily. Pt takes 70 units in the morning and 70 units at night. 08/27/20   Oretha Milch D, MD  lidocaine-prilocaine (EMLA) cream APPLY TO AFFECTED AREA 1 time DAILY 09/15/20   Alla Feeling, NP  losartan (COZAAR) 100 MG tablet Take 1 tablet (100 mg total) by mouth daily. 10/09/20   Geralynn Rile, MD  metFORMIN (GLUCOPHAGE) 500 MG tablet Take 500 mg by mouth 2 (two) times daily with a meal.  06/25/20   [provider]  Nutritional Supplements (FEEDING SUPPLEMENT, GLUCERNA 1.2 CAL,) LIQD Take 237 mLs by mouth 3 (three) times daily with meals.    [provider]  Omega-3 Fatty Acids (FISH OIL) 1000 MG CAPS Take 1,000 mg by mouth daily.    [provider]  omeprazole (PRILOSEC) 20 MG capsule Take 1 capsule (20 mg total) by mouth daily. 10/09/20   Geralynn Rile, MD  ondansetron (ZOFRAN-ODT) 4 MG disintegrating tablet Take 1 tablet (4 mg total) by mouth 2 (two) times daily as needed for up to 30 doses for nausea or vomiting. 10/26/20   Curatolo, Adam, DO  potassium chloride (KLOR-CON) 10 MEQ tablet Take 20 mEq by mouth daily. DAILY WITH LASIX    [provider]  prochlorperazine (COMPAZINE) 10 MG tablet Take 1 tablet (10 mg total) by mouth every 6 (six) hours  as needed for nausea or vomiting. 10/19/20   [name], MD  promethazine (PHENERGAN) 25 MG suppository Place 1 suppository (25 mg total) rectally every 8 (eight) hours as needed for nausea or vomiting. 10/28/20   [name], MD  RESTASIS 0.05 % ophthalmic emulsion Place 1 drop into both eyes 2 (two) times daily. 07/13/20   [provider]  rosuvastatin (CRESTOR) 5 MG tablet Take 5 mg by mouth at bedtime. 06/25/20   [provider]  spironolactone (  ALDACTONE) 25 MG tablet Take 1 tablet (25 mg total) by mouth daily. 10/09/20   O'NealCassie Freer, MD  tiZANidine (ZANAFLEX) 4 MG tablet Take 4 mg by mouth 2 (two) times daily as needed for muscle spasms.  06/23/20   [provider]  torsemide (DEMADEX) 20 MG tablet Take 2 tablets (40 mg total) by mouth daily. 10/09/20 01/07/21  O'NealCassie Freer, MD  vitamin E 180 MG (400 UNITS) capsule Take 400 Units by mouth daily.    [provider]  zolpidem (AMBIEN) 5 MG tablet Take 1 tablet (5 mg total) by mouth at bedtime as needed for sleep. 10/28/20   [name], MD    Allergies Coconut (cocos nucifera) allergy skin test, Hydrochlorothiazide, Lisinopril, and Coconut oil   REVIEW OF SYSTEMS  Negative except as noted here or in the History of Present Illness.   PHYSICAL EXAMINATION  Initial Vital Signs Blood pressure (!) 186/96, pulse 76, temperature 98.5 F (36.9 C), temperature source Oral, resp. rate 19, height 5\' 3"  (1.6 m), weight 98 kg, SpO2 99 %.  Examination General: Well-developed, well-nourished female in no acute distress; appearance consistent with age of record HENT: normocephalic; atraumatic Eyes: pupils equal, round and reactive to light; extraocular muscles intact Neck: supple Heart: regular rate and rhythm Lungs: clear to auscultation bilaterally Chest: Upper chest wall tenderness which reproduces the pain of the chief complaint Abdomen: soft; nondistended; nontender; bowel sounds present Extremities:  No deformity; full range of motion; pulses normal Neurologic: Awake, alert and oriented; motor function intact in all extremities and symmetric; no facial droop Skin: Warm and dry Psychiatric: Tearful   RESULTS  Summary of this visit's results, reviewed and interpreted by myself:  EKG Interpretation:  Date & Time: 10/30/2020 5:56 AM  Rate: 77  Rhythm: normal sinus rhythm  QRS Axis: normal  Intervals: normal  ST/T Wave abnormalities: normal  Conduction Disutrbances:left bundle branch block  Narrative Interpretation:   Old EKG Reviewed: unchanged  Laboratory Studies: No results found for this or any previous visit (from the past 24 hour(s)). Imaging Studies: No results found.  ED COURSE and MDM  Nursing notes, initial and subsequent vitals signs, including pulse oximetry, reviewed and interpreted by myself.  Vitals:   10/30/20 0544 10/30/20 0547  BP:  (!) 186/96  Pulse:  76  Resp:  19  Temp:  98.5 F (36.9 C)  TempSrc:  Oral  SpO2:  99%  Weight: 98 kg   Height: 5\' 3"  (1.6 m)    Medications  ondansetron (ZOFRAN-ODT) disintegrating tablet 8 mg (has no administration in time range)  HYDROmorphone (DILAUDID) injection 2 mg (has no administration in time range)   We will treat the patient's nausea and pain in the ED and transport her to the cancer center for her 8 AM appointment.  6:42 AM Patient feeling much better after IM Dilaudid.   PROCEDURES  Procedures   ED DIAGNOSES     ICD-10-CM   1. 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

## 2020-10-30 NOTE — ED Notes (Signed)
Printed pt dc papers, advised pt to keep all upcoming appointments, and assisted pt in getting dressed to go to appt at cancer center. CNA will take pt via wheelchair to cancer center for appt. Pt A&O X 4, no questions, calm and cooperative.

## 2020-10-30 NOTE — Plan of Care (Signed)
Pt arrived to [facility] via [place]. Notably drowsy from receiving two dose of Phenergan at the cancer center. Port accessed with + blood return, dressing c/d/I. VS WNL. Ox4. Skin WNL. Awaiting admission orders.    Problem: Education: Goal: Knowledge of General Education information will improve Description: Including pain rating scale, medication(s)/side effects and non-pharmacologic comfort measures Outcome: Progressing

## 2020-10-31 ENCOUNTER — Observation Stay (HOSPITAL_COMMUNITY): Payer: Medicare HMO

## 2020-10-31 DIAGNOSIS — Z7982 Long term (current) use of aspirin: Secondary | ICD-10-CM | POA: Diagnosis not present

## 2020-10-31 DIAGNOSIS — E876 Hypokalemia: Secondary | ICD-10-CM | POA: Diagnosis present

## 2020-10-31 DIAGNOSIS — Z8041 Family history of malignant neoplasm of ovary: Secondary | ICD-10-CM | POA: Diagnosis not present

## 2020-10-31 DIAGNOSIS — Z7984 Long term (current) use of oral hypoglycemic drugs: Secondary | ICD-10-CM | POA: Diagnosis not present

## 2020-10-31 DIAGNOSIS — C50412 Malignant neoplasm of upper-outer quadrant of left female breast: Secondary | ICD-10-CM | POA: Diagnosis present

## 2020-10-31 DIAGNOSIS — Z87891 Personal history of nicotine dependence: Secondary | ICD-10-CM | POA: Diagnosis not present

## 2020-10-31 DIAGNOSIS — K219 Gastro-esophageal reflux disease without esophagitis: Secondary | ICD-10-CM | POA: Diagnosis present

## 2020-10-31 DIAGNOSIS — K2970 Gastritis, unspecified, without bleeding: Secondary | ICD-10-CM | POA: Diagnosis present

## 2020-10-31 DIAGNOSIS — Z833 Family history of diabetes mellitus: Secondary | ICD-10-CM | POA: Diagnosis not present

## 2020-10-31 DIAGNOSIS — Z170 Estrogen receptor positive status [ER+]: Secondary | ICD-10-CM | POA: Diagnosis not present

## 2020-10-31 DIAGNOSIS — I5032 Chronic diastolic (congestive) heart failure: Secondary | ICD-10-CM | POA: Diagnosis present

## 2020-10-31 DIAGNOSIS — Z8616 Personal history of COVID-19: Secondary | ICD-10-CM | POA: Diagnosis not present

## 2020-10-31 DIAGNOSIS — Z79899 Other long term (current) drug therapy: Secondary | ICD-10-CM | POA: Diagnosis not present

## 2020-10-31 DIAGNOSIS — K5900 Constipation, unspecified: Secondary | ICD-10-CM | POA: Diagnosis not present

## 2020-10-31 DIAGNOSIS — R112 Nausea with vomiting, unspecified: Secondary | ICD-10-CM | POA: Diagnosis present

## 2020-10-31 DIAGNOSIS — K5909 Other constipation: Secondary | ICD-10-CM | POA: Diagnosis present

## 2020-10-31 DIAGNOSIS — Z794 Long term (current) use of insulin: Secondary | ICD-10-CM | POA: Diagnosis not present

## 2020-10-31 DIAGNOSIS — E785 Hyperlipidemia, unspecified: Secondary | ICD-10-CM | POA: Diagnosis present

## 2020-10-31 DIAGNOSIS — E1140 Type 2 diabetes mellitus with diabetic neuropathy, unspecified: Secondary | ICD-10-CM | POA: Diagnosis present

## 2020-10-31 DIAGNOSIS — T451X5A Adverse effect of antineoplastic and immunosuppressive drugs, initial encounter: Secondary | ICD-10-CM | POA: Diagnosis present

## 2020-10-31 DIAGNOSIS — N179 Acute kidney failure, unspecified: Secondary | ICD-10-CM | POA: Diagnosis present

## 2020-10-31 DIAGNOSIS — I10 Essential (primary) hypertension: Secondary | ICD-10-CM | POA: Diagnosis not present

## 2020-10-31 DIAGNOSIS — R079 Chest pain, unspecified: Secondary | ICD-10-CM | POA: Diagnosis not present

## 2020-10-31 DIAGNOSIS — Z6836 Body mass index (BMI) 36.0-36.9, adult: Secondary | ICD-10-CM | POA: Diagnosis not present

## 2020-10-31 DIAGNOSIS — I110 Hypertensive heart disease with heart failure: Secondary | ICD-10-CM | POA: Diagnosis present

## 2020-10-31 LAB — COMPREHENSIVE METABOLIC PANEL
ALT: 60 U/L — ABNORMAL HIGH (ref 0–44)
AST: 61 U/L — ABNORMAL HIGH (ref 15–41)
Albumin: 3.8 g/dL (ref 3.5–5.0)
Alkaline Phosphatase: 60 U/L (ref 38–126)
Anion gap: 11 (ref 5–15)
BUN: 19 mg/dL (ref 8–23)
CO2: 28 mmol/L (ref 22–32)
Calcium: 10.5 mg/dL — ABNORMAL HIGH (ref 8.9–10.3)
Chloride: 95 mmol/L — ABNORMAL LOW (ref 98–111)
Creatinine, Ser: 0.94 mg/dL (ref 0.44–1.00)
GFR, Estimated: >60 mL/min (ref >60)
Glucose, Bld: 171 mg/dL — ABNORMAL HIGH (ref 70–99)
Potassium: 3.5 mmol/L (ref 3.5–5.1)
Sodium: 134 mmol/L — ABNORMAL LOW (ref 135–145)
Total Bilirubin: 0.8 mg/dL (ref 0.3–1.2)
Total Protein: 7.6 g/dL (ref 6.5–8.1)

## 2020-10-31 LAB — CBC
HCT: 39.9 % (ref 36.0–46.0)
Hemoglobin: 13.1 g/dL (ref 12.0–15.0)
MCH: 27.6 pg (ref 26.0–34.0)
MCHC: 32.8 g/dL (ref 30.0–36.0)
MCV: 84 fL (ref 80.0–100.0)
Platelets: 169 10*3/uL (ref 150–400)
RBC: 4.75 MIL/uL (ref 3.87–5.11)
RDW: 12.8 % (ref 11.5–15.5)
WBC: 7.1 10*3/uL (ref 4.0–10.5)
nRBC: 0 % (ref 0.0–0.2)

## 2020-10-31 LAB — SARS CORONAVIRUS 2 (TAT 6-24 HRS): SARS Coronavirus 2: NEGATIVE

## 2020-10-31 LAB — GLUCOSE, CAPILLARY
Glucose-Capillary: 135 mg/dL — ABNORMAL HIGH (ref 70–99)
Glucose-Capillary: 101 mg/dL — ABNORMAL HIGH (ref 70–99)
Glucose-Capillary: 112 mg/dL — ABNORMAL HIGH (ref 70–99)
Glucose-Capillary: 140 mg/dL — ABNORMAL HIGH (ref 70–99)

## 2020-10-31 IMAGING — CT CT ABD-PELV W/O CM
2 of 4 series · 17 of 46 positions shown, 19 images · non-contrast
Comparison: [DATE]

CLINICAL DATA: Abdominal distension. Recent diagnosis of breast
cancer.

EXAM:
CT ABDOMEN AND PELVIS WITHOUT CONTRAST
TECHNIQUE: Multidetector CT imaging of the abdomen and pelvis was performed
following the standard protocol without IV contrast.

[Series 2: axial st · axial · 0.96mm/px · z∈[-489,-84]mm · 14 of 91 slices shown, 16 images]
[im 5/91  soft-tissue]
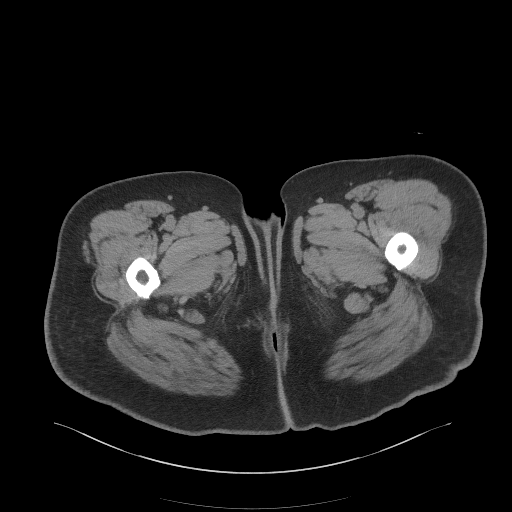
[im 5/91  bone]
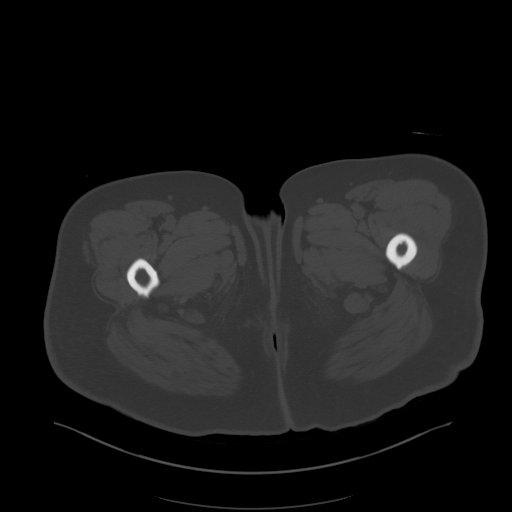
[im 10/91  soft-tissue]
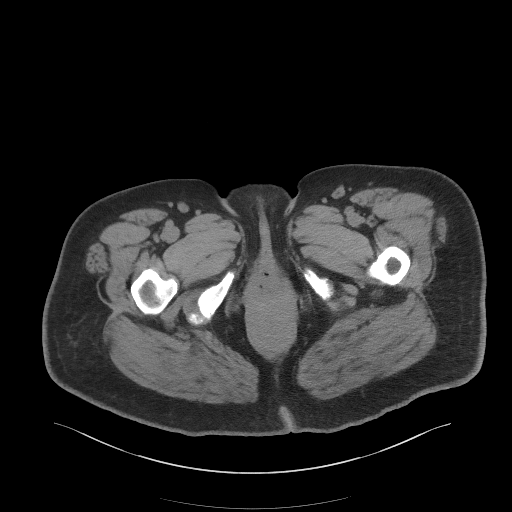
[im 19/91  soft-tissue]
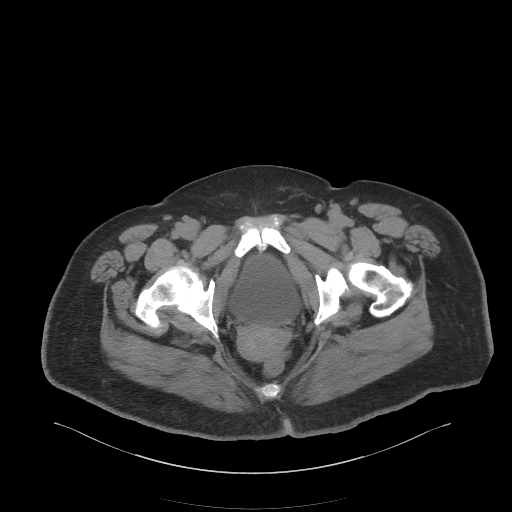
[im 24/91  soft-tissue]
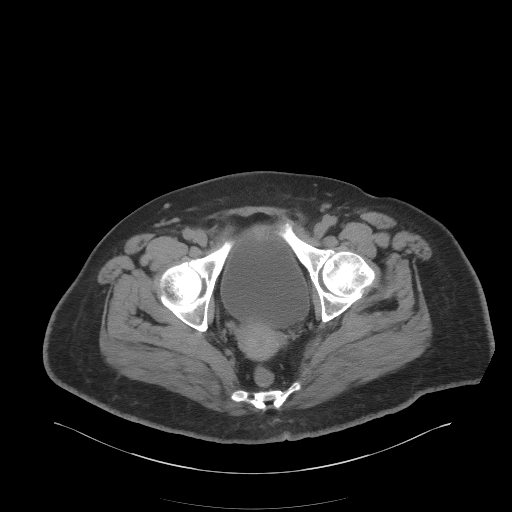
[im 29/91  soft-tissue]
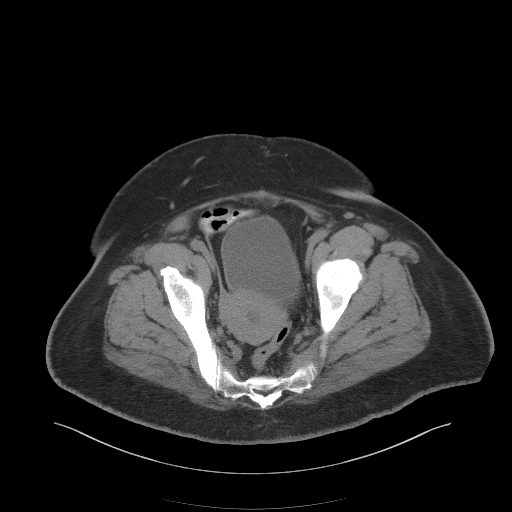
[im 38/91  soft-tissue]
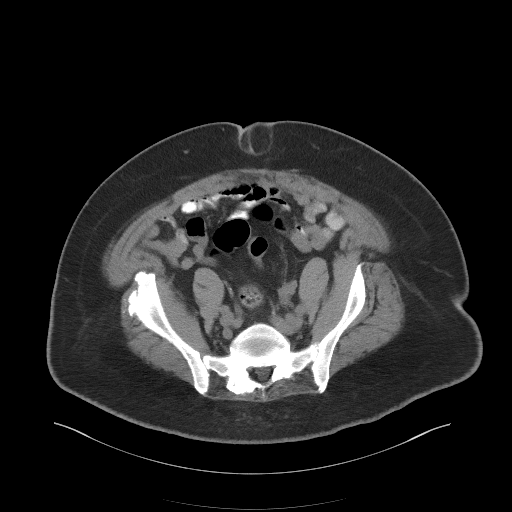
[im 43/91  soft-tissue]
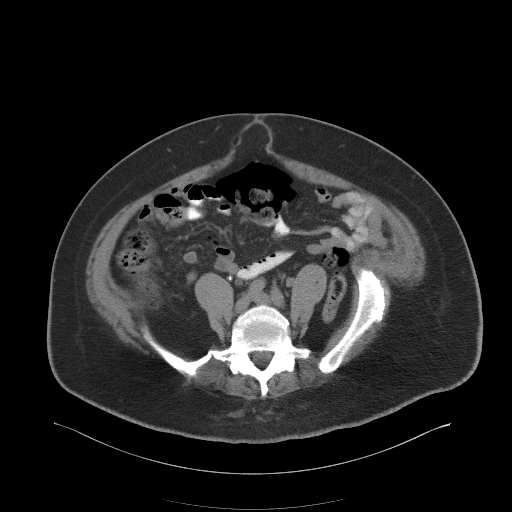
[im 48/91  soft-tissue]
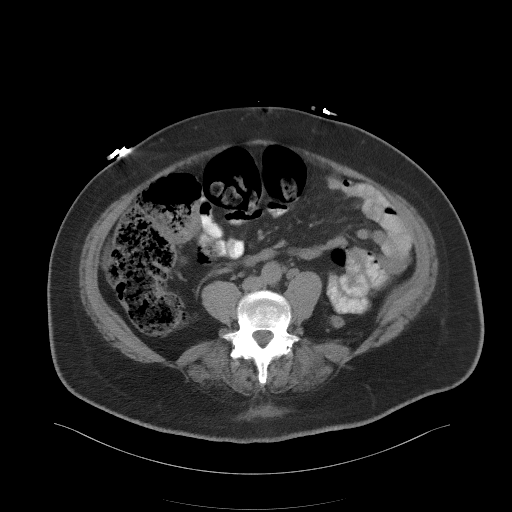
[im 53/91  soft-tissue]
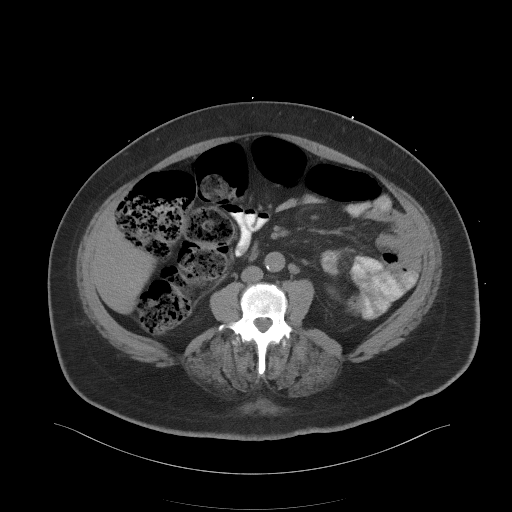
[im 53/91  bone]
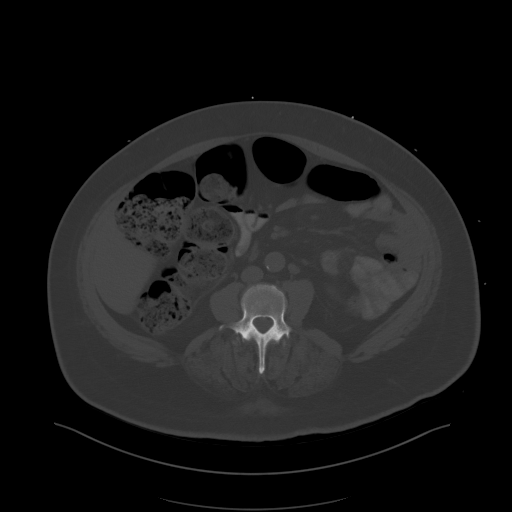
[im 62/91  soft-tissue]
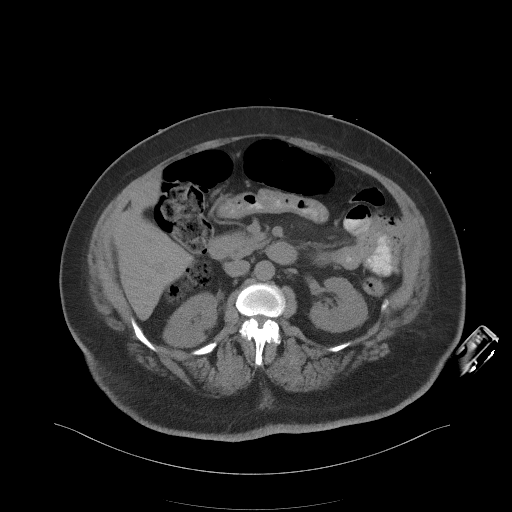
[im 67/91  soft-tissue]
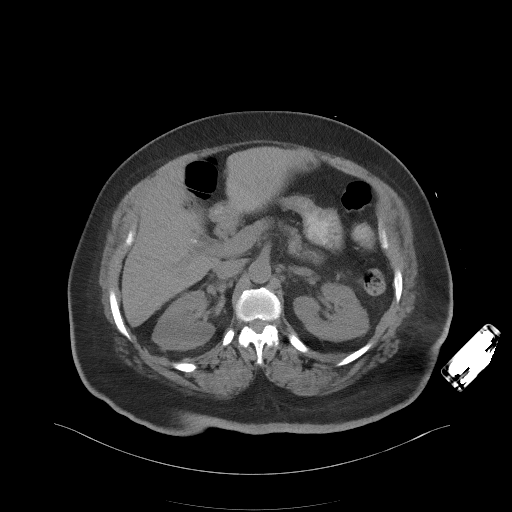
[im 72/91  soft-tissue]
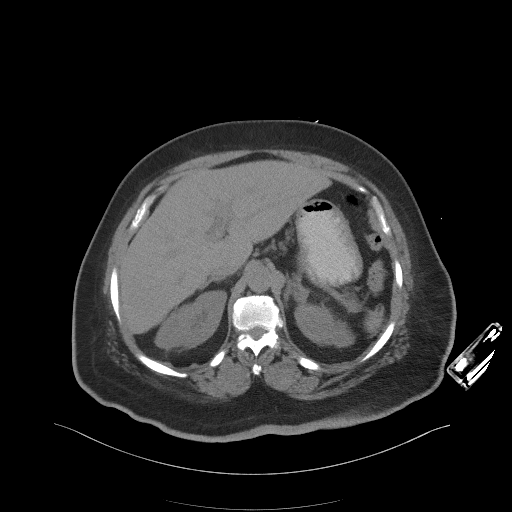
[im 81/91  soft-tissue]
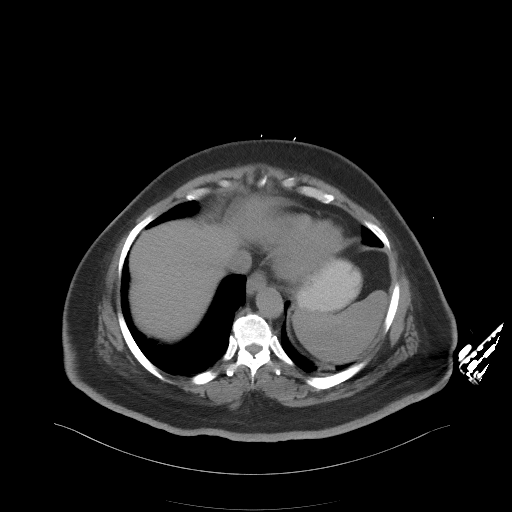
[im 86/91  soft-tissue]
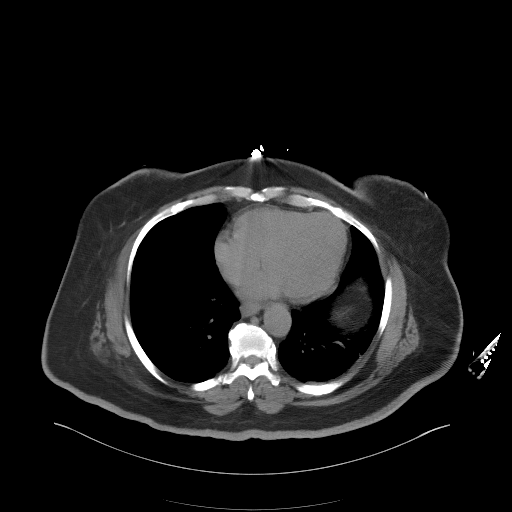

[Series 5: coronal st · coronal · 0.91mm/px · 3 of 119 slices shown]
[im 40/119  soft-tissue]
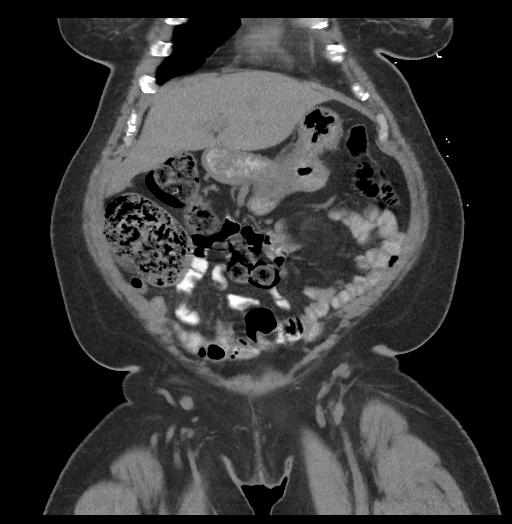
[im 53/119  soft-tissue]
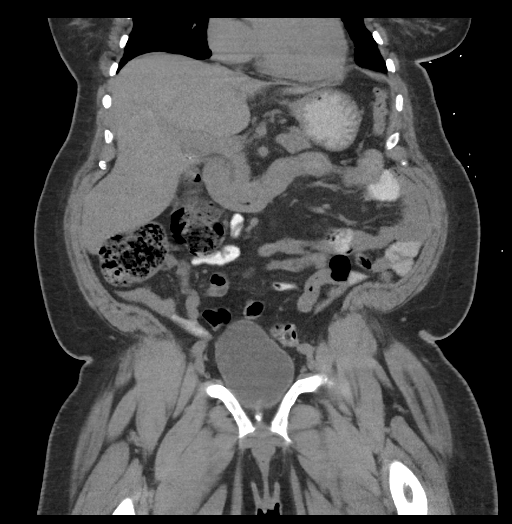
[im 66/119  soft-tissue]
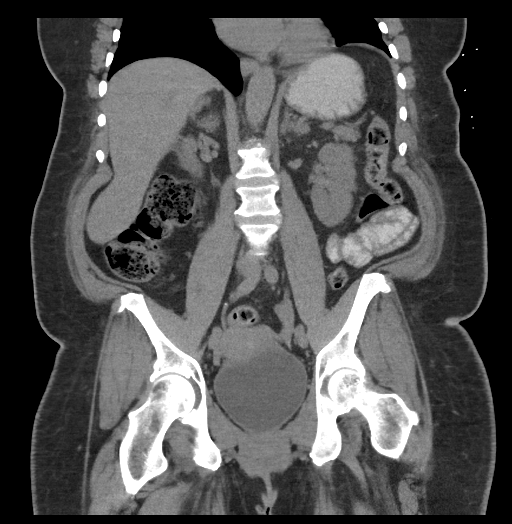

[17 of 46 positions shown; findings below may reference images not displayed]

FINDINGS: Lower chest: Linear atelectasis in the left lower lobe.

Hepatobiliary: Liver appears normal without contrast. Previous
cholecystectomy.

Pancreas: Normal

Spleen: Normal

Adrenals/Urinary Tract: Adrenal glands are normal. Kidneys appear
normal without contrast. No sign of obstruction. Bladder is normal.

Stomach/Bowel: Stomach is normal. Small bowel pattern is normal. No
small bowel obstruction. Large amount of stool and gas in the right
colon and gas in the transverse colon. Left colon is normal. Rectum
is normal.

Vascular/Lymphatic: Aortic atherosclerosis. No aneurysm. IVC is
normal. No retroperitoneal adenopathy.

Reproductive: No pelvic mass. Small amount of fluid in the
endometrial canal as seen previously.

Other: No free fluid or air.

Musculoskeletal: Umbilical hernia containing only fat. Chronic
degenerative changes of the mid to lower lumbar spine.
IMPRESSION: 1. No acute finding by CT other than a large amount of stool and gas
in the right colon and gas in the transverse colon. No small bowel
obstruction.
2. Previous cholecystectomy.
3. Aortic atherosclerosis.
4. Umbilical hernia containing only fat.
5. Small amount of fluid in the endometrial canal as seen
previously.

Aortic Atherosclerosis ([XA]-[XA]).

## 2020-10-31 MED ORDER — MORPHINE SULFATE (PF) 2 MG/ML IV SOLN
2.0000 mg | Freq: Once | INTRAVENOUS | Status: AC
  Administered 2020-10-31: 2 mg via INTRAVENOUS
  Filled 2020-10-31: qty 1

## 2020-10-31 MED ORDER — SUCRALFATE 1 GM/10ML PO SUSP
1.0000 g | Freq: Three times a day (TID) | ORAL | Status: DC
  Administered 2020-10-31 – 2020-11-04 (×15): 1 g via ORAL
  Filled 2020-10-31 (×16): qty 10

## 2020-10-31 MED ORDER — CHLORHEXIDINE GLUCONATE CLOTH 2 % EX PADS
6.0000 | MEDICATED_PAD | Freq: Every day | CUTANEOUS | Status: DC
  Administered 2020-10-31 – 2020-11-04 (×5): 6 via TOPICAL

## 2020-10-31 MED ORDER — DULOXETINE HCL 30 MG PO CPEP
30.0000 mg | ORAL_CAPSULE | Freq: Once | ORAL | Status: AC
  Administered 2020-10-31: 30 mg via ORAL
  Filled 2020-10-31: qty 1

## 2020-10-31 MED ORDER — BISACODYL 5 MG PO TBEC
5.0000 mg | DELAYED_RELEASE_TABLET | Freq: Every day | ORAL | Status: DC | PRN
  Administered 2020-10-31 – 2020-11-03 (×3): 5 mg via ORAL
  Filled 2020-10-31 (×3): qty 1

## 2020-10-31 MED ORDER — SODIUM CHLORIDE 0.9 % IV SOLN: INTRAVENOUS | Status: DC

## 2020-10-31 MED ORDER — HYDROCODONE-ACETAMINOPHEN 5-325 MG PO TABS
1.0000 | ORAL_TABLET | Freq: Four times a day (QID) | ORAL | Status: DC | PRN
  Administered 2020-10-31: 1 via ORAL
  Filled 2020-10-31: qty 1

## 2020-10-31 MED ORDER — DULOXETINE HCL 60 MG PO CPEP
60.0000 mg | ORAL_CAPSULE | Freq: Every day | ORAL | Status: DC
  Administered 2020-11-01 – 2020-11-04 (×4): 60 mg via ORAL
  Filled 2020-10-31 (×4): qty 1

## 2020-10-31 MED ORDER — TRAZODONE HCL 50 MG PO TABS
50.0000 mg | ORAL_TABLET | Freq: Once | ORAL | Status: AC
  Administered 2020-10-31: 50 mg via ORAL
  Filled 2020-10-31: qty 1

## 2020-10-31 NOTE — Progress Notes (Signed)
PROGRESS NOTE    Cheryl Blankenship  LEX:[id number] DOB: 1951-03-15 DOA: 10/30/2020 PCP: [name], PA-C   Brief Narrative: 70 year old female with history of breast cancer, symptomatic Covid infection in early January, type 2 diabetes mellitus with neuropathy, hypertension, diastolic CHF who has been admitted from cancer center with intractable chest pain for the past week.  As per the report She was seen in the ED on 10/26/2020 for chest wall pain which was present for a week at that time and work-up was negative for cardiac ischemia.  She was given a prescription for hydrocodone but did not yet fill the prescription and her pain had since worsened.  She continued to have pain across her upper chest rated 10/10 described as burning in nature worse with movement or palpation and presented to the ED this a.m. for the symptoms with associated nausea.  CTA chest was negative but did have slightly elevated troponin.  She was given Dilaudid IM with improvement in her symptoms and was discharged in stable condition with plans to follow-up at her already scheduled oncology appointment at 8 AM this morning.  Her oncologist discussed the pain with her cardiologist and this was not deemed to be cardiogenic in nature.  It was thought her pain was more related to severe acid reflux or musculoskeletal in nature but due to her intractable pain and difficulty controlling it at home and was given Phenergan in the office.  Her oncologist asked to have her admitted to the hospital for further evaluation.  Dr. Burr Medico had already consulted Dr. Alessandra Bevels, [facility], prior to arrival who will see in consult.  Subjective: Seen this morning patient c/o of burning pain under bilateral breast area and epigastrium. C/O of nausea.   GI in room.  Assessment & Plan:  Intractable nausea/vomiting and uncontrolled Chest pain and abdominal pain with concern for GERD versus obstruction, W/ underarm burning sensation, since  10/26/2020 with no significant bump in troponin and no ischemic change on EKG had ED evaluation.  Dr. Burr Medico I discussed with her cardiologist Dr. Johann Capers does not feel this is cardiac and GI work-up is being planned, was started on PPI.  GI on board and possible EGD in am.  Altered mental status mild medication induced delirium In ED after Dilaudid and Phenergan in the ED.  Opiates being held, supportive care.  Appears alert awake and oriented.  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 Annamaria Boots was on neoadjuvant chemo and she is to [facility] on 12/21-poorly tolerated due to persistent nausea vomiting constipation abdominal pain chest pain insomnia-and discontinued and there is plan to start antiestrogen therapy and cont Herceptin q3 wk while waiting on consultation for breast surgery.  Managed by Dr. Annamaria Boots.  Mild hypercalcemia likely from malignancy.  Monitor calcium level.  General body pain diarrhea nausea vomiting constipation: Symptomatic management.  Complains of mostly constipation at this time.  Essential hypertension/HLD: Blood pressure is fairly controlled today, borderline on admission.  On amlodipine, carvedilol, on statins.  Chronic diastolic CHF not in exacerbation, continue Demedex, coreg,holding losartan. Net IO Since Admission: -1,425 mL [10/31/20 1049]   Type 2 diabetes mellitus without complication: Continue current Lantus and sliding scale insulin monitor CBG. Has poorly controlled hemoglobin A1c 10.3 Recent Labs  Lab 10/30/20 1623 10/30/20 2140 10/31/20 0746  GLUCAP 177* 175* 135*   Covid positive in early January 2022, asymptomatic did not need treatment and tested negative  later in January 2022  Morbid obesity BMI 36.  Outpatient PCP follow-up  Nutrition: Diet Order            Diet NPO time specified  Diet effective now                Pt's Body mass index is 36.98 kg/m.  DVT  prophylaxis: enoxaparin (LOVENOX) injection 40 mg Start: 10/30/20 1800 Code Status:   Code Status: Full Code  Family Communication: plan of care discussed with patient at bedside.  Status XF:[id number] as Observation Patient remains hospitalized for ongoing management of her symptoms with plan for further gi eval and possible endoscopy in the morning. Dispo: The patient is from: Home              Anticipated d/c is to: Home              Anticipated d/c date is: 2 days              Patient currently is not medically stable to d/c.   Difficult to place patient No  Consultants:see note  Procedures:see note  Culture/Microbiology No results found for: SDES, SPECREQUEST, CULT, REPTSTATUS  Other culture-see note  Medications: Scheduled Meds: . amLODipine  10 mg Oral Daily  . aspirin  81 mg Oral Daily  . carvedilol  25 mg Oral BID WC  . Chlorhexidine Gluconate Cloth  6 each Topical Daily  . DULoxetine  30 mg Oral Daily  . enoxaparin (LOVENOX) injection  40 mg Subcutaneous Q24H  . insulin aspart  0-15 Units Subcutaneous TID WC  . insulin aspart  0-5 Units Subcutaneous QHS  . insulin glargine  25 Units Subcutaneous QHS  . pantoprazole (PROTONIX) IV  40 mg Intravenous Daily  . rosuvastatin  5 mg Oral QHS  . torsemide  40 mg Oral Daily   Continuous Infusions:  Antimicrobials: Anti-infectives (From admission, onward)   None     Objective: Vitals: Today's Vitals   10/31/20 0140 10/31/20 0224 10/31/20 0602 10/31/20 0622  BP:    139/74  Pulse:    68  Resp:      Temp:    98.3 F (36.8 C)  TempSrc:    Oral  SpO2:    93%  Weight:      Height:      PainSc: 7  Asleep 7      Intake/Output Summary (Last 24 hours) at 10/31/2020 1049 Last data filed at 10/31/2020 1696 Gross per 24 hour  Intake 50 ml  Output 1475 ml  Net -1425 ml   Filed Weights   10/30/20 1443  Weight: 94.7 kg   Weight change:   Intake/Output from previous day: 02/11 0701 - 02/12 0700 In: 50 [P.O.:50] Out:  1475 [Urine:1475] Intake/Output this shift: No intake/output data recorded. Filed Weights   10/30/20 1443  Weight: 94.7 kg    Examination: General exam: AAO, obese, on room air, not in acute distress. HEENT:Oral mucosa moist, Ear/Nose WNL grossly,dentition normal. Respiratory system: bilaterally clear,no wheezing or crackles,no use of accessory muscle, non tender. Cardiovascular system: S1 & S2 +, regular, No JVD. Gastrointestinal system: Abdomen soft, mildly tender epigastric area,ND, BS+. Nervous System:Alert, awake, moving extremities and grossly nonfocal Extremities: No edema, distal peripheral pulses palpable.  Skin: No rashes,no icterus. MSK: Normal muscle bulk,tone, power   Data Reviewed: I have personally reviewed following labs and imaging studies CBC: Recent Labs  Lab 10/26/20 1447 10/28/20 1427 10/30/20 1513 10/31/20 0400  WBC 6.9 6.5 6.7 7.1  NEUTROABS  --  3.2  --   --   HGB 12.9 12.3 13.1 13.1  HCT 40.1 37.6 39.8 39.9  MCV 87.6 84.3 84.5 84.0  PLT 104* 117* 155 497   Basic Metabolic Panel: Recent Labs  Lab 10/26/20 1447 10/28/20 1427 10/30/20 1513 10/30/20 1656 10/31/20 0400  NA 141 135 137  --  134*  K 3.6 3.7 3.0*  --  3.5  CL 100 97* 99  --  95*  CO2 26 30 28   --  28  GLUCOSE 241* 297* 191*  --  171*  BUN 21 11 17   --  19  CREATININE 1.33* 1.03* 0.86  --  0.94  CALCIUM 10.6* 10.7* 10.5*  --  10.5*  MG  --   --   --  1.8  --    GFR: Estimated Creatinine Clearance: 63.5 mL/min (by C-G formula based on SCr of 0.94 mg/dL). Liver Function Tests: Recent Labs  Lab 10/26/20 2235 10/28/20 1427 10/30/20 1513 10/31/20 0400  AST 38 33 50* 61*  ALT 29 27 49* 60*  ALKPHOS 67 71 60 60  BILITOT 0.8 0.6 0.9 0.8  PROT 7.9 7.9 7.5 7.6  ALBUMIN 3.9 3.7 3.7 3.8   Recent Labs  Lab 10/26/20 2235  LIPASE 24   No results for input(s): AMMONIA in the last 168 hours. Coagulation Profile: No results for input(s): INR, PROTIME in the last 168  hours. Cardiac Enzymes: No results for input(s): CKTOTAL, CKMB, CKMBINDEX, TROPONINI in the last 168 hours. BNP (last 3 results) No results for input(s): PROBNP in the last 8760 hours. HbA1C: Recent Labs    10/30/20 1513  HGBA1C 10.3*   CBG: Recent Labs  Lab 10/30/20 1623 10/30/20 2140 10/31/20 0746  GLUCAP 177* 175* 135*   Lipid Profile: Recent Labs    10/30/20 1513  CHOL 134  HDL 38*  LDLCALC 63  TRIG 165*  CHOLHDL 3.5   Thyroid Function Tests: No results for input(s): TSH, T4TOTAL, FREET4, T3FREE, THYROIDAB in the last 72 hours. Anemia Panel: No results for input(s): VITAMINB12, FOLATE, FERRITIN, TIBC, IRON, RETICCTPCT in the last 72 hours. Sepsis Labs: No results for input(s): PROCALCITON, LATICACIDVEN in the last 168 hours.  No results found for this or any previous visit (from the past 240 hour(s)).   Radiology Studies: CT ABDOMEN PELVIS WO CONTRAST  Result Date: 10/31/2020 CLINICAL DATA:  Abdominal distension. Recent diagnosis of breast cancer. EXAM: CT ABDOMEN AND PELVIS WITHOUT CONTRAST TECHNIQUE: Multidetector CT imaging of the abdomen and pelvis was performed following the standard protocol without IV contrast. COMPARISON:  08/07/2020 FINDINGS: Lower chest: Linear atelectasis in the left lower lobe. Hepatobiliary: Liver appears normal without contrast. Previous cholecystectomy. Pancreas: Normal Spleen: Normal Adrenals/Urinary Tract: Adrenal glands are normal. Kidneys appear normal without contrast. No sign of obstruction. Bladder is normal. Stomach/Bowel: Stomach is normal. Small bowel pattern is normal. No small bowel obstruction. Large amount of stool and gas in the right colon and gas in the transverse colon. Left colon is normal. Rectum is normal. Vascular/Lymphatic: Aortic atherosclerosis. No aneurysm. IVC is normal. No retroperitoneal adenopathy. Reproductive: No pelvic mass. Small amount of fluid in the endometrial canal as seen previously. Other: No free  fluid or air. Musculoskeletal: Umbilical hernia containing only fat. Chronic degenerative changes of the mid to lower lumbar spine. IMPRESSION: 1. No acute finding by CT other than a large amount of stool and gas in the right colon and gas in the transverse colon. No small bowel obstruction.  2. Previous cholecystectomy. 3. Aortic atherosclerosis. 4. Umbilical hernia containing only fat. 5. Small amount of fluid in the endometrial canal as seen previously. Aortic Atherosclerosis (ICD10-I70.0). Electronically Signed   By: Nelson Chimes M.D.   On: 10/31/2020 01:33   DG Abd 1 View  Result Date: 10/30/2020 CLINICAL DATA:  Abdominal pain and distension. EXAM: ABDOMEN - 1 VIEW COMPARISON:  None. FINDINGS: The visualized lung bases are grossly clear.  No pleural effusions. Moderate air throughout the small bowel and colon which may suggest an ileus. No free air. The bony structures are intact. IMPRESSION: Possible ileus. Electronically Signed   By: Marijo Sanes M.D.   On: 10/30/2020 16:28     LOS: 0 days   Antonieta Pert, MD [facility]  10/31/2020, 10:49 AM

## 2020-10-31 NOTE — H&P (View-Only) (Signed)
[facility] Consultation Note  Referring Provider: [facility] Primary Care Physician:  [name], PA-C  Reason for Consultation:  Nausea, vomiting, abdominal pain.  HPI: Cheryl Blankenship is a 70 y.o. female whom we've been asked to see for nausea, vomiting, abdominal pain.  Patient reports no history of any significant GI issues, and no problems at all like she's having, until she started chemotherapy for her breast cancer.  Now has ongoing upper abdominal pain, nausea, vomiting.  CT and labs constipation otherwise unrevealing.  Is unable to eat.  No hematemesis, dysphagia, odynophagia, melena.  Colonoscopy many years ago (no details); denies prior endoscopy; report of "stomach surgery" noted but no further details noted, and no obvious abnormalities or surgical staples on stomach seen on recent CT.   Past Medical History:  Diagnosis Date  . Anxiety   . Breast cancer ([place])   . CHF (congestive heart failure) ([place])   . Diabetes mellitus without complication ([place])    type 2  . Hypertension     Past Surgical History:  Procedure Laterality Date  . APPENDECTOMY    . CHOLECYSTECTOMY    . COLONOSCOPY    . EYE SURGERY     cataracts  . MULTIPLE TOOTH EXTRACTIONS    . PORTACATH PLACEMENT N/A 08/04/2020   Procedure: INSERTION PORT-A-CATH WITH ULTRASOUND GUIDANCE;  Surgeon: [name], MD;  Location: [facility];  Service: General;  Laterality: N/A;  . STOMACH SURGERY      Prior to Admission medications   Medication Sig Start Date End Date Taking? Authorizing Provider  albuterol (VENTOLIN HFA) 108 (90 Base) MCG/ACT inhaler Inhale 1-2 puffs into the lungs every 4 (four) hours as needed for wheezing or shortness of breath. 09/01/20  Yes Alla Feeling, NP  amLODipine (NORVASC) 10 MG tablet Take 1 tablet (10 mg total) by mouth daily. 10/09/20 09/02/21 Yes [name], [name], MD  aspirin 81 MG chewable tablet Chew 81 mg by mouth daily.    Yes [provider]  carvedilol (COREG) 25 MG tablet Take 1 tablet (25 mg total) by mouth 2 (two) times daily with a meal. 10/09/20 01/07/21 Yes [name], [name], MD  Cholecalciferol 25 MCG (1000 UT) capsule Take 1,000 Units by mouth daily. 06/25/20  Yes [provider]  DULoxetine (CYMBALTA) 30 MG capsule Take 30 mg by mouth daily. 04/21/20  Yes [provider]  folic acid (FOLVITE) 1 MG tablet Take 1 mg by mouth daily. 07/14/20  Yes [provider]  gabapentin (NEURONTIN) 400 MG capsule Take 400 mg by mouth 3 (three) times daily.  04/21/20  Yes [provider]  HYDROcodone-acetaminophen (NORCO/VICODIN) 5-325 MG tablet Take 1 tablet by mouth every 6 (six) hours as needed for up to 15 doses for moderate pain. 10/26/20  Yes Curatolo, Adam, DO  insulin aspart (NOVOLOG) 100 UNIT/ML injection Inject 2-10 Units into the skin 3 (three) times daily before meals. Per sliding scale:  150-199 = 2 units, 200-249 = 4 units, 250-299 = 6 units, 300-349 = 8 units, greater than350 = 10 units.   Yes [provider]  insulin glargine (LANTUS SOLOSTAR) 100 UNIT/ML Solostar Pen Inject 35 Units into the skin 2 (two) times daily. Patient taking differently: Inject 35 Units into the skin 2 (two) times daily. Pt takes 70 units in the morning and 70 units at night. 08/27/20  Yes Oretha Milch D, MD  lidocaine-prilocaine (EMLA) cream APPLY TO AFFECTED AREA 1 time DAILY Patient taking differently: Apply 1 application  topically daily as needed (pain). 09/15/20  Yes Alla Feeling, NP  losartan (COZAAR) 100 MG tablet Take 1 tablet (100 mg total) by mouth daily. 10/09/20  Yes [name], [name], MD  metFORMIN (GLUCOPHAGE) 500 MG tablet Take 500 mg by mouth 2 (two) times daily with a meal.  06/25/20  Yes [provider]  Nutritional Supplements (FEEDING SUPPLEMENT, GLUCERNA 1.2 CAL,) LIQD Take 237 mLs by mouth 3 (three) times daily with meals.   Yes [provider]  Omega-3  Fatty Acids (FISH OIL) 1000 MG CAPS Take 1,000 mg by mouth daily.   Yes [provider]  omeprazole (PRILOSEC) 20 MG capsule Take 1 capsule (20 mg total) by mouth daily. 10/09/20  Yes [name], [name], MD  ondansetron (ZOFRAN-ODT) 4 MG disintegrating tablet Take 1 tablet (4 mg total) by mouth 2 (two) times daily as needed for up to 30 doses for nausea or vomiting. 10/26/20  Yes Curatolo, Adam, DO  prochlorperazine (COMPAZINE) 10 MG tablet Take 1 tablet (10 mg total) by mouth every 6 (six) hours as needed for nausea or vomiting. 10/19/20  Yes [name], MD  RESTASIS 0.05 % ophthalmic emulsion Place 1 drop into both eyes 2 (two) times daily. 07/13/20  Yes [provider]  rosuvastatin (CRESTOR) 5 MG tablet Take 5 mg by mouth at bedtime. 06/25/20  Yes [provider]  spironolactone (ALDACTONE) 25 MG tablet Take 1 tablet (25 mg total) by mouth daily. 10/09/20  Yes [name], [name], MD  torsemide (DEMADEX) 20 MG tablet Take 2 tablets (40 mg total) by mouth daily. 10/09/20 01/07/21 Yes [name], [name], MD  Accu-Chek FastClix Lancets MISC  08/14/20   [provider]  diphenoxylate-atropine (LOMOTIL) 2.5-0.025 MG tablet Take 1-2 tablets by mouth 4 (four) times daily as needed for diarrhea or loose stools. Patient not taking: Reported on 10/30/2020 09/01/20   Alla Feeling, NP  empagliflozin (JARDIANCE) 10 MG TABS tablet Take 1 tablet (10 mg total) by mouth daily before breakfast. Patient not taking: Reported on 10/30/2020 10/09/20   Geralynn Rile, MD  glipiZIDE (GLUCOTROL XL) 10 MG 24 hr tablet Take 10 mg by mouth daily with breakfast.  06/24/20   [provider]  promethazine (PHENERGAN) 25 MG suppository Place 1 suppository (25 mg total) rectally every 8 (eight) hours as needed for nausea or vomiting. 10/28/20   [name], MD  zolpidem (AMBIEN) 5 MG tablet Take 1 tablet (5 mg total) by mouth at bedtime as needed for sleep. 10/28/20   [name], MD     Current Facility-Administered Medications  Medication Dose Route Frequency Provider Last Rate Last Admin  . acetaminophen (TYLENOL) tablet 500 mg  500 mg Oral Q4H PRN Harold Hedge, MD   500 mg at 10/31/20 0948  . albuterol (VENTOLIN HFA) 108 (90 Base) MCG/ACT inhaler 1-2 puff  1-2 puff Inhalation Q4H PRN Harold Hedge, MD      . amLODipine (NORVASC) tablet 10 mg  10 mg Oral Daily Harold Hedge, MD   10 mg at 10/31/20 0947  . aspirin chewable tablet 81 mg  81 mg Oral Daily Harold Hedge, MD   81 mg at 10/31/20 0949  . carvedilol (COREG) tablet 25 mg  25 mg Oral BID WC Harold Hedge, MD   25 mg at 10/31/20 0949  . Chlorhexidine Gluconate Cloth 2 % PADS 6 each  6 each Topical Daily Antonieta Pert, MD      . Derrill Memo ON  11/01/2020] DULoxetine (CYMBALTA) DR capsule 60 mg  60 mg Oral Daily Kc, Ramesh, MD      . enoxaparin (LOVENOX) injection 40 mg  40 mg Subcutaneous Q24H Harold Hedge, MD   40 mg at 10/30/20 1813  . insulin aspart (novoLOG) injection 0-15 Units  0-15 Units Subcutaneous TID WC Harold Hedge, MD   2 Units at 10/31/20 0850  . insulin aspart (novoLOG) injection 0-5 Units  0-5 Units Subcutaneous QHS Harold Hedge, MD      . insulin glargine (LANTUS) injection 25 Units  25 Units Subcutaneous QHS Harold Hedge, MD   25 Units at 10/30/20 2137  . labetalol (NORMODYNE) injection 10 mg  10 mg Intravenous Q2H PRN Harold Hedge, MD      . ondansetron [facility]) injection 4 mg  4 mg Intravenous Q6H PRN Harold Hedge, MD   4 mg at 10/31/20 1121  . pantoprazole (PROTONIX) injection 40 mg  40 mg Intravenous Daily Harold Hedge, MD   40 mg at 10/31/20 1036  . rosuvastatin (CRESTOR) tablet 5 mg  5 mg Oral QHS Harold Hedge, MD   5 mg at 10/30/20 2136  . torsemide (DEMADEX) tablet 40 mg  40 mg Oral Daily Harold Hedge, MD   40 mg at 10/31/20 1884    Allergies as of 10/30/2020 - Review Complete 10/30/2020  Allergen Reaction Noted  . Coconut (cocos nucifera) allergy skin test  09/07/2020  .  Hydrochlorothiazide Swelling 08/03/2020  . Lisinopril Swelling 03/10/2020  . Coconut oil Hives 07/30/2020    Family History  Problem Relation Age of Onset  . Heart attack Sister   . Diabetes Sister   . Heart attack Mother   . Ovarian cancer Mother 42  . Heart attack Brother   . Cancer Niece        bone cancer   . Cancer Cousin        unknown type cancer   . Cancer Cousin        metastatic cancer to bone     Social History   Socioeconomic History  . Marital status: Married    Spouse name: Not on file  . Number of children: 2  . Years of education: Not on file  . Highest education level: Not on file  Occupational History  . Occupation: retired Human resources officer  Tobacco Use  . Smoking status: Former Smoker    Packs/day: 3.00    Years: 30.00    Pack years: 90.00  . Smokeless tobacco: Never Used  Vaping Use  . Vaping Use: Never used  Substance and Sexual Activity  . 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 Drug use: Never  . Sexual activity: Not on file  Other Topics Concern  . Not on file  Social History Narrative  . Not on file   Social Determinants of Health   Financial Resource Strain: Not on file  Food Insecurity: Not on file  Transportation Needs: Not on file  Physical Activity: Not on file  Stress: Not on file  Social Connections: Not on file  Intimate Partner Violence: Not on file    Review of Systems: As per HPI, all others negative  Physical Exam: Vital signs in last 24 hours: Temp:  [98.2 F (36.8 C)-98.7 F (37.1 C)] 98.3 F (36.8 C) (02/12 0622) Pulse Rate:  [68-79] 68 (02/12 0622) Resp:  [17] 17 (02/11 1443) BP: (139-177)/(74-103)  139/74 (02/12 0622) SpO2:  [93 %-94 %] 93 % (02/12 0622) Weight:  [94.7 kg] 94.7 kg (02/11 1443)   General:   Alert, obese, tearful and frustrated Head:  Normocephalic and atraumatic. Eyes:  Sclera clear, no icterus.   Conjunctiva pink. Ears:  Normal auditory  acuity. Nose:  No deformity, discharge,  or lesions. Mouth:  No deformity or lesions.  Oropharynx pink & moist. Neck:  Supple; no masses or thyromegaly. Abdomen:  Soft, protuberant, mild epigastric tenderness, no distention. No masses, hepatosplenomegaly or hernias noted. Normal bowel sounds, without guarding, and without rebound.     Msk:  Symmetrical without gross deformities. Normal posture. Pulses:  Normal pulses noted. Extremities:  Without clubbing or edema. Neurologic:  Alert and  oriented x4; diffusely weak, otherwise grossly normal neurologically. Skin:  Intact without significant lesions or rashes. Psych:  Tearful. Normal mood and affect.   Lab Results: Recent Labs    10/28/20 1427 10/30/20 1513 10/31/20 0400  WBC 6.5 6.7 7.1  HGB 12.3 13.1 13.1  HCT 37.6 39.8 39.9  PLT 117* 155 169   BMET Recent Labs    10/28/20 1427 10/30/20 1513 10/31/20 0400  NA 135 137 134*  K 3.7 3.0* 3.5  CL 97* 99 95*  CO2 30 28 28   GLUCOSE 297* 191* 171*  BUN 11 17 19   CREATININE 1.03* 0.86 0.94  CALCIUM 10.7* 10.5* 10.5*   LFT Recent Labs    10/31/20 0400  PROT 7.6  ALBUMIN 3.8  AST 61*  ALT 60*  ALKPHOS 60  BILITOT 0.8   PT/INR No results for input(s): LABPROT, INR in the last 72 hours.  Studies/Results: CT ABDOMEN PELVIS WO CONTRAST  Result Date: 10/31/2020 CLINICAL DATA:  Abdominal distension. Recent diagnosis of breast cancer. EXAM: CT ABDOMEN AND PELVIS WITHOUT CONTRAST TECHNIQUE: Multidetector CT imaging of the abdomen and pelvis was performed following the standard protocol without IV contrast. COMPARISON:  08/07/2020 FINDINGS: Lower chest: Linear atelectasis in the left lower lobe. Hepatobiliary: Liver appears normal without contrast. Previous cholecystectomy. Pancreas: Normal Spleen: Normal Adrenals/Urinary Tract: Adrenal glands are normal. Kidneys appear normal without contrast. No sign of obstruction. Bladder is normal. Stomach/Bowel: Stomach is normal. Small  bowel pattern is normal. No small bowel obstruction. Large amount of stool and gas in the right colon and gas in the transverse colon. Left colon is normal. Rectum is normal. Vascular/Lymphatic: Aortic atherosclerosis. No aneurysm. IVC is normal. No retroperitoneal adenopathy. Reproductive: No pelvic mass. Small amount of fluid in the endometrial canal as seen previously. Other: No free fluid or air. Musculoskeletal: Umbilical hernia containing only fat. Chronic degenerative changes of the mid to lower lumbar spine. IMPRESSION: 1. No acute finding by CT other than a large amount of stool and gas in the right colon and gas in the transverse colon. No small bowel obstruction. 2. Previous cholecystectomy. 3. Aortic atherosclerosis. 4. Umbilical hernia containing only fat. 5. Small amount of fluid in the endometrial canal as seen previously. Aortic Atherosclerosis (ICD10-I70.0). Electronically Signed   By: Nelson Chimes M.D.   On: 10/31/2020 01:33   DG Abd 1 View  Result Date: 10/30/2020 CLINICAL DATA:  Abdominal pain and distension. EXAM: ABDOMEN - 1 VIEW COMPARISON:  None. FINDINGS: The visualized lung bases are grossly clear.  No pleural effusions. Moderate air throughout the small bowel and colon which may suggest an ileus. No free air. The bony structures are intact. IMPRESSION: Possible ileus. Electronically Signed   By: Marijo Sanes M.D.   On:  10/30/2020 16:28    Impression:  1.  Nausea and vomiting. 2.  Epigastric pain.  Negative labs and imaging. 3.  Breast cancer on chemotherapy. 4.  Multiple medical problems.  Plan:  1.  Patient reports all of her symptoms started after initiating chemotherapy, so may be due to that, although other causes can't be excluded.  Abrupt onset after chemo not typical of gastroparesis, although her chemo-related nausea/vomiting could be enhanced if she has propensity to gastroparesis. 2.  Clear liquid diet today. 3.  PPI.  Antiemetics. 4.  Add sucralfate  suspension. 5.  Plan for endoscopy tomorrow; if this is unrevealing, would manage supportively without any further GI tract evaluation. 6.  Case discussed with Dr. Maren Beach. 7.  Needs COVID screening pre-procedure. 8.  Risks (bleeding, infection, bowel perforation that could require surgery, sedation-related changes in cardiopulmonary systems), benefits (identification and possible treatment of source of symptoms, exclusion of certain causes of symptoms), and alternatives (watchful waiting, radiographic imaging studies, empiric medical treatment) of upper endoscopy (EGD) were explained to patient/family in detail and patient wishes to proceed. 9.  [facility] will follow.   LOS: 0 days   Maripat Borba M  10/31/2020, 12:12 PM  Cell [phone] If no answer or after 5 PM call [phone]

## 2020-10-31 NOTE — Consult Note (Signed)
[facility] Consultation Note  Referring Provider: [facility] Primary Care Physician:  [name], PA-C  Reason for Consultation:  Nausea, vomiting, abdominal pain.  HPI: Cheryl Blankenship is a 70 y.o. female whom we've been asked to see for nausea, vomiting, abdominal pain.  Patient reports no history of any significant GI issues, and no problems at all like she's having, until she started chemotherapy for her breast cancer.  Now has ongoing upper abdominal pain, nausea, vomiting.  CT and labs constipation otherwise unrevealing.  Is unable to eat.  No hematemesis, dysphagia, odynophagia, melena.  Colonoscopy many years ago (no details); denies prior endoscopy; report of "stomach surgery" noted but no further details noted, and no obvious abnormalities or surgical staples on stomach seen on recent CT.   Past Medical History:  Diagnosis Date  . Anxiety   . Breast cancer ([place])   . CHF (congestive heart failure) ([place])   . Diabetes mellitus without complication ([place])    type 2  . Hypertension     Past Surgical History:  Procedure Laterality Date  . APPENDECTOMY    . CHOLECYSTECTOMY    . COLONOSCOPY    . EYE SURGERY     cataracts  . MULTIPLE TOOTH EXTRACTIONS    . PORTACATH PLACEMENT N/A 08/04/2020   Procedure: INSERTION PORT-A-CATH WITH ULTRASOUND GUIDANCE;  Surgeon: [name], MD;  Location: [facility];  Service: General;  Laterality: N/A;  . STOMACH SURGERY      Prior to Admission medications   Medication Sig Start Date End Date Taking? Authorizing Provider  albuterol (VENTOLIN HFA) 108 (90 Base) MCG/ACT inhaler Inhale 1-2 puffs into the lungs every 4 (four) hours as needed for wheezing or shortness of breath. 09/01/20  Yes Alla Feeling, NP  amLODipine (NORVASC) 10 MG tablet Take 1 tablet (10 mg total) by mouth daily. 10/09/20 09/02/21 Yes [name], [name], MD  aspirin 81 MG chewable tablet Chew 81 mg by mouth daily.    Yes [provider]  carvedilol (COREG) 25 MG tablet Take 1 tablet (25 mg total) by mouth 2 (two) times daily with a meal. 10/09/20 01/07/21 Yes [name], [name], MD  Cholecalciferol 25 MCG (1000 UT) capsule Take 1,000 Units by mouth daily. 06/25/20  Yes [provider]  DULoxetine (CYMBALTA) 30 MG capsule Take 30 mg by mouth daily. 04/21/20  Yes [provider]  folic acid (FOLVITE) 1 MG tablet Take 1 mg by mouth daily. 07/14/20  Yes [provider]  gabapentin (NEURONTIN) 400 MG capsule Take 400 mg by mouth 3 (three) times daily.  04/21/20  Yes [provider]  HYDROcodone-acetaminophen (NORCO/VICODIN) 5-325 MG tablet Take 1 tablet by mouth every 6 (six) hours as needed for up to 15 doses for moderate pain. 10/26/20  Yes Curatolo, Adam, DO  insulin aspart (NOVOLOG) 100 UNIT/ML injection Inject 2-10 Units into the skin 3 (three) times daily before meals. Per sliding scale:  150-199 = 2 units, 200-249 = 4 units, 250-299 = 6 units, 300-349 = 8 units, greater than350 = 10 units.   Yes [provider]  insulin glargine (LANTUS SOLOSTAR) 100 UNIT/ML Solostar Pen Inject 35 Units into the skin 2 (two) times daily. Patient taking differently: Inject 35 Units into the skin 2 (two) times daily. Pt takes 70 units in the morning and 70 units at night. 08/27/20  Yes Oretha Milch D, MD  lidocaine-prilocaine (EMLA) cream APPLY TO AFFECTED AREA 1 time DAILY Patient taking differently: Apply 1 application  topically daily as needed (pain). 09/15/20  Yes Alla Feeling, NP  losartan (COZAAR) 100 MG tablet Take 1 tablet (100 mg total) by mouth daily. 10/09/20  Yes [name], [name], MD  metFORMIN (GLUCOPHAGE) 500 MG tablet Take 500 mg by mouth 2 (two) times daily with a meal.  06/25/20  Yes [provider]  Nutritional Supplements (FEEDING SUPPLEMENT, GLUCERNA 1.2 CAL,) LIQD Take 237 mLs by mouth 3 (three) times daily with meals.   Yes [provider]  Omega-3  Fatty Acids (FISH OIL) 1000 MG CAPS Take 1,000 mg by mouth daily.   Yes [provider]  omeprazole (PRILOSEC) 20 MG capsule Take 1 capsule (20 mg total) by mouth daily. 10/09/20  Yes [name], [name], MD  ondansetron (ZOFRAN-ODT) 4 MG disintegrating tablet Take 1 tablet (4 mg total) by mouth 2 (two) times daily as needed for up to 30 doses for nausea or vomiting. 10/26/20  Yes Curatolo, Adam, DO  prochlorperazine (COMPAZINE) 10 MG tablet Take 1 tablet (10 mg total) by mouth every 6 (six) hours as needed for nausea or vomiting. 10/19/20  Yes [name], MD  RESTASIS 0.05 % ophthalmic emulsion Place 1 drop into both eyes 2 (two) times daily. 07/13/20  Yes [provider]  rosuvastatin (CRESTOR) 5 MG tablet Take 5 mg by mouth at bedtime. 06/25/20  Yes [provider]  spironolactone (ALDACTONE) 25 MG tablet Take 1 tablet (25 mg total) by mouth daily. 10/09/20  Yes [name], [name], MD  torsemide (DEMADEX) 20 MG tablet Take 2 tablets (40 mg total) by mouth daily. 10/09/20 01/07/21 Yes [name], [name], MD  Accu-Chek FastClix Lancets MISC  08/14/20   [provider]  diphenoxylate-atropine (LOMOTIL) 2.5-0.025 MG tablet Take 1-2 tablets by mouth 4 (four) times daily as needed for diarrhea or loose stools. Patient not taking: Reported on 10/30/2020 09/01/20   Alla Feeling, NP  empagliflozin (JARDIANCE) 10 MG TABS tablet Take 1 tablet (10 mg total) by mouth daily before breakfast. Patient not taking: Reported on 10/30/2020 10/09/20   Geralynn Rile, MD  glipiZIDE (GLUCOTROL XL) 10 MG 24 hr tablet Take 10 mg by mouth daily with breakfast.  06/24/20   [provider]  promethazine (PHENERGAN) 25 MG suppository Place 1 suppository (25 mg total) rectally every 8 (eight) hours as needed for nausea or vomiting. 10/28/20   [name], MD  zolpidem (AMBIEN) 5 MG tablet Take 1 tablet (5 mg total) by mouth at bedtime as needed for sleep. 10/28/20   [name], MD     Current Facility-Administered Medications  Medication Dose Route Frequency Provider Last Rate Last Admin  . acetaminophen (TYLENOL) tablet 500 mg  500 mg Oral Q4H PRN Harold Hedge, MD   500 mg at 10/31/20 0948  . albuterol (VENTOLIN HFA) 108 (90 Base) MCG/ACT inhaler 1-2 puff  1-2 puff Inhalation Q4H PRN Harold Hedge, MD      . amLODipine (NORVASC) tablet 10 mg  10 mg Oral Daily Harold Hedge, MD   10 mg at 10/31/20 0947  . aspirin chewable tablet 81 mg  81 mg Oral Daily Harold Hedge, MD   81 mg at 10/31/20 0949  . carvedilol (COREG) tablet 25 mg  25 mg Oral BID WC Harold Hedge, MD   25 mg at 10/31/20 0949  . Chlorhexidine Gluconate Cloth 2 % PADS 6 each  6 each Topical Daily Antonieta Pert, MD      . Derrill Memo ON  11/01/2020] DULoxetine (CYMBALTA) DR capsule 60 mg  60 mg Oral Daily Kc, Ramesh, MD      . enoxaparin (LOVENOX) injection 40 mg  40 mg Subcutaneous Q24H Harold Hedge, MD   40 mg at 10/30/20 1813  . insulin aspart (novoLOG) injection 0-15 Units  0-15 Units Subcutaneous TID WC Harold Hedge, MD   2 Units at 10/31/20 0850  . insulin aspart (novoLOG) injection 0-5 Units  0-5 Units Subcutaneous QHS Harold Hedge, MD      . insulin glargine (LANTUS) injection 25 Units  25 Units Subcutaneous QHS Harold Hedge, MD   25 Units at 10/30/20 2137  . labetalol (NORMODYNE) injection 10 mg  10 mg Intravenous Q2H PRN Harold Hedge, MD      . ondansetron [facility]) injection 4 mg  4 mg Intravenous Q6H PRN Harold Hedge, MD   4 mg at 10/31/20 1121  . pantoprazole (PROTONIX) injection 40 mg  40 mg Intravenous Daily Harold Hedge, MD   40 mg at 10/31/20 1036  . rosuvastatin (CRESTOR) tablet 5 mg  5 mg Oral QHS Harold Hedge, MD   5 mg at 10/30/20 2136  . torsemide (DEMADEX) tablet 40 mg  40 mg Oral Daily Harold Hedge, MD   40 mg at 10/31/20 5176    Allergies as of 10/30/2020 - Review Complete 10/30/2020  Allergen Reaction Noted  . Coconut (cocos nucifera) allergy skin test  09/07/2020  .  Hydrochlorothiazide Swelling 08/03/2020  . Lisinopril Swelling 03/10/2020  . Coconut oil Hives 07/30/2020    Family History  Problem Relation Age of Onset  . Heart attack Sister   . Diabetes Sister   . Heart attack Mother   . Ovarian cancer Mother 19  . Heart attack Brother   . Cancer Niece        bone cancer   . Cancer Cousin        unknown type cancer   . Cancer Cousin        metastatic cancer to bone     Social History   Socioeconomic History  . Marital status: Married    Spouse name: Not on file  . Number of children: 2  . Years of education: Not on file  . Highest education level: Not on file  Occupational History  . Occupation: retired Human resources officer  Tobacco Use  . Smoking status: Former Smoker    Packs/day: 3.00    Years: 30.00    Pack years: 90.00  . Smokeless tobacco: Never Used  Vaping Use  . Vaping Use: Never used  Substance and Sexual Activity  . 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 Drug use: Never  . Sexual activity: Not on file  Other Topics Concern  . Not on file  Social History Narrative  . Not on file   Social Determinants of Health   Financial Resource Strain: Not on file  Food Insecurity: Not on file  Transportation Needs: Not on file  Physical Activity: Not on file  Stress: Not on file  Social Connections: Not on file  Intimate Partner Violence: Not on file    Review of Systems: As per HPI, all others negative  Physical Exam: Vital signs in last 24 hours: Temp:  [98.2 F (36.8 C)-98.7 F (37.1 C)] 98.3 F (36.8 C) (02/12 0622) Pulse Rate:  [68-79] 68 (02/12 0622) Resp:  [17] 17 (02/11 1443) BP: (139-177)/(74-103)  139/74 (02/12 0622) SpO2:  [93 %-94 %] 93 % (02/12 0622) Weight:  [94.7 kg] 94.7 kg (02/11 1443)   General:   Alert, obese, tearful and frustrated Head:  Normocephalic and atraumatic. Eyes:  Sclera clear, no icterus.   Conjunctiva pink. Ears:  Normal auditory  acuity. Nose:  No deformity, discharge,  or lesions. Mouth:  No deformity or lesions.  Oropharynx pink & moist. Neck:  Supple; no masses or thyromegaly. Abdomen:  Soft, protuberant, mild epigastric tenderness, no distention. No masses, hepatosplenomegaly or hernias noted. Normal bowel sounds, without guarding, and without rebound.     Msk:  Symmetrical without gross deformities. Normal posture. Pulses:  Normal pulses noted. Extremities:  Without clubbing or edema. Neurologic:  Alert and  oriented x4; diffusely weak, otherwise grossly normal neurologically. Skin:  Intact without significant lesions or rashes. Psych:  Tearful. Normal mood and affect.   Lab Results: Recent Labs    10/28/20 1427 10/30/20 1513 10/31/20 0400  WBC 6.5 6.7 7.1  HGB 12.3 13.1 13.1  HCT 37.6 39.8 39.9  PLT 117* 155 169   BMET Recent Labs    10/28/20 1427 10/30/20 1513 10/31/20 0400  NA 135 137 134*  K 3.7 3.0* 3.5  CL 97* 99 95*  CO2 30 28 28   GLUCOSE 297* 191* 171*  BUN 11 17 19   CREATININE 1.03* 0.86 0.94  CALCIUM 10.7* 10.5* 10.5*   LFT Recent Labs    10/31/20 0400  PROT 7.6  ALBUMIN 3.8  AST 61*  ALT 60*  ALKPHOS 60  BILITOT 0.8   PT/INR No results for input(s): LABPROT, INR in the last 72 hours.  Studies/Results: CT ABDOMEN PELVIS WO CONTRAST  Result Date: 10/31/2020 CLINICAL DATA:  Abdominal distension. Recent diagnosis of breast cancer. EXAM: CT ABDOMEN AND PELVIS WITHOUT CONTRAST TECHNIQUE: Multidetector CT imaging of the abdomen and pelvis was performed following the standard protocol without IV contrast. COMPARISON:  08/07/2020 FINDINGS: Lower chest: Linear atelectasis in the left lower lobe. Hepatobiliary: Liver appears normal without contrast. Previous cholecystectomy. Pancreas: Normal Spleen: Normal Adrenals/Urinary Tract: Adrenal glands are normal. Kidneys appear normal without contrast. No sign of obstruction. Bladder is normal. Stomach/Bowel: Stomach is normal. Small  bowel pattern is normal. No small bowel obstruction. Large amount of stool and gas in the right colon and gas in the transverse colon. Left colon is normal. Rectum is normal. Vascular/Lymphatic: Aortic atherosclerosis. No aneurysm. IVC is normal. No retroperitoneal adenopathy. Reproductive: No pelvic mass. Small amount of fluid in the endometrial canal as seen previously. Other: No free fluid or air. Musculoskeletal: Umbilical hernia containing only fat. Chronic degenerative changes of the mid to lower lumbar spine. IMPRESSION: 1. No acute finding by CT other than a large amount of stool and gas in the right colon and gas in the transverse colon. No small bowel obstruction. 2. Previous cholecystectomy. 3. Aortic atherosclerosis. 4. Umbilical hernia containing only fat. 5. Small amount of fluid in the endometrial canal as seen previously. Aortic Atherosclerosis (ICD10-I70.0). Electronically Signed   By: Nelson Chimes M.D.   On: 10/31/2020 01:33   DG Abd 1 View  Result Date: 10/30/2020 CLINICAL DATA:  Abdominal pain and distension. EXAM: ABDOMEN - 1 VIEW COMPARISON:  None. FINDINGS: The visualized lung bases are grossly clear.  No pleural effusions. Moderate air throughout the small bowel and colon which may suggest an ileus. No free air. The bony structures are intact. IMPRESSION: Possible ileus. Electronically Signed   By: Marijo Sanes M.D.   On:  10/30/2020 16:28    Impression:  1.  Nausea and vomiting. 2.  Epigastric pain.  Negative labs and imaging. 3.  Breast cancer on chemotherapy. 4.  Multiple medical problems.  Plan:  1.  Patient reports all of her symptoms started after initiating chemotherapy, so may be due to that, although other causes can't be excluded.  Abrupt onset after chemo not typical of gastroparesis, although her chemo-related nausea/vomiting could be enhanced if she has propensity to gastroparesis. 2.  Clear liquid diet today. 3.  PPI.  Antiemetics. 4.  Add sucralfate  suspension. 5.  Plan for endoscopy tomorrow; if this is unrevealing, would manage supportively without any further GI tract evaluation. 6.  Case discussed with Dr. Maren Beach. 7.  Needs COVID screening pre-procedure. 8.  Risks (bleeding, infection, bowel perforation that could require surgery, sedation-related changes in cardiopulmonary systems), benefits (identification and possible treatment of source of symptoms, exclusion of certain causes of symptoms), and alternatives (watchful waiting, radiographic imaging studies, empiric medical treatment) of upper endoscopy (EGD) were explained to patient/family in detail and patient wishes to proceed. 9.  [facility] will follow.   LOS: 0 days   Waylyn Tenbrink M  10/31/2020, 12:12 PM  Cell [phone] If no answer or after 5 PM call [phone]

## 2020-11-01 ENCOUNTER — Inpatient Hospital Stay (HOSPITAL_COMMUNITY): Payer: Medicare HMO | Admitting: Anesthesiology

## 2020-11-01 ENCOUNTER — Encounter (HOSPITAL_COMMUNITY): Payer: Self-pay | Admitting: Internal Medicine

## 2020-11-01 ENCOUNTER — Encounter (HOSPITAL_COMMUNITY)
Admission: AD | Disposition: A | Discharge status: Home / Self Care | Payer: Self-pay | Source: Ambulatory Visit | Attending: Internal Medicine

## 2020-11-01 DIAGNOSIS — I10 Essential (primary) hypertension: Secondary | ICD-10-CM | POA: Diagnosis not present

## 2020-11-01 HISTORY — PX: ESOPHAGOGASTRODUODENOSCOPY (EGD) WITH PROPOFOL: SHX5813

## 2020-11-01 LAB — GLUCOSE, CAPILLARY
Glucose-Capillary: 93 mg/dL (ref 70–99)
Glucose-Capillary: 88 mg/dL (ref 70–99)
Glucose-Capillary: 102 mg/dL — ABNORMAL HIGH (ref 70–99)
Glucose-Capillary: 120 mg/dL — ABNORMAL HIGH (ref 70–99)

## 2020-11-01 SURGERY — ESOPHAGOGASTRODUODENOSCOPY (EGD) WITH PROPOFOL
Anesthesia: Monitor Anesthesia Care

## 2020-11-01 MED ORDER — TRAZODONE HCL 50 MG PO TABS
50.0000 mg | ORAL_TABLET | Freq: Every evening | ORAL | Status: DC | PRN
  Administered 2020-11-01 – 2020-11-03 (×3): 50 mg via ORAL
  Filled 2020-11-01 (×3): qty 1

## 2020-11-01 MED ORDER — ONDANSETRON HCL 4 MG/2ML IJ SOLN
4.0000 mg | Freq: Four times a day (QID) | INTRAMUSCULAR | Status: DC
  Administered 2020-11-01 – 2020-11-04 (×13): 4 mg via INTRAVENOUS
  Filled 2020-11-01 (×13): qty 2

## 2020-11-01 MED ORDER — LACTATED RINGERS IV SOLN: INTRAVENOUS | Status: DC | PRN

## 2020-11-01 MED ORDER — PROPOFOL 10 MG/ML IV BOLUS
INTRAVENOUS | Status: DC | PRN
  Administered 2020-11-01 (×4): 20 mg via INTRAVENOUS
  Administered 2020-11-01: 40 mg via INTRAVENOUS

## 2020-11-01 MED ORDER — MORPHINE SULFATE (PF) 2 MG/ML IV SOLN
2.0000 mg | Freq: Once | INTRAVENOUS | Status: AC
  Administered 2020-11-01: 2 mg via INTRAVENOUS
  Filled 2020-11-01: qty 1

## 2020-11-01 MED ORDER — ESMOLOL HCL 100 MG/10ML IV SOLN
INTRAVENOUS | Status: DC | PRN
  Administered 2020-11-01: 10 mg via INTRAVENOUS

## 2020-11-01 MED ORDER — MORPHINE SULFATE 15 MG PO TABS
7.5000 mg | ORAL_TABLET | ORAL | Status: DC | PRN
  Administered 2020-11-01 – 2020-11-04 (×10): 7.5 mg via ORAL
  Filled 2020-11-01 (×11): qty 1

## 2020-11-01 MED ORDER — HYDROCODONE-ACETAMINOPHEN 5-325 MG PO TABS
1.0000 | ORAL_TABLET | Freq: Four times a day (QID) | ORAL | Status: DC | PRN
  Filled 2020-11-01: qty 1

## 2020-11-01 MED ORDER — PROPOFOL 500 MG/50ML IV EMUL
INTRAVENOUS | Status: AC
  Filled 2020-11-01: qty 200

## 2020-11-01 MED ORDER — MORPHINE SULFATE (PF) 2 MG/ML IV SOLN
2.0000 mg | INTRAVENOUS | Status: DC | PRN
  Administered 2020-11-01 – 2020-11-03 (×2): 2 mg via INTRAVENOUS
  Filled 2020-11-01 (×2): qty 1

## 2020-11-01 SURGICAL SUPPLY — 15 items

## 2020-11-01 NOTE — Anesthesia Preprocedure Evaluation (Addendum)
Anesthesia Evaluation  Patient identified by MRN, date of birth, ID band Patient awake    Reviewed: Allergy & Precautions, NPO status , Patient's Chart, lab work & pertinent test results, reviewed documented beta blocker date and time   Airway Mallampati: II  TM Distance: >3 FB Neck ROM: Full    Dental  (+) Dental Advisory Given, Chipped, Missing   Pulmonary former smoker,    Pulmonary exam normal breath sounds clear to auscultation       Cardiovascular hypertension, Pt. on medications and Pt. on home beta blockers +CHF  Normal cardiovascular exam Rhythm:Regular Rate:Normal  Echo 07/31/20: 1. Left ventricular ejection fraction, by estimation, is 65 to 70%. The  left ventricle has normal function. The left ventricle has no regional  wall motion abnormalities. Left ventricular diastolic parameters are  indeterminate. The average left  ventricular global longitudinal strain is -17.1 %.  2. Right ventricular systolic function is normal. The right ventricular  size is normal. There is normal pulmonary artery systolic pressure.  3. The mitral valve is normal in structure. Trivial mitral valve  regurgitation. No evidence of mitral stenosis.  4. The aortic valve is normal in structure. Aortic valve regurgitation is  not visualized. No aortic stenosis is present.    Neuro/Psych PSYCHIATRIC DISORDERS Anxiety negative neurological ROS     GI/Hepatic Neg liver ROS, GERD  Medicated,N/V, abdominal pain    Endo/Other  diabetes, Type 2, Oral Hypoglycemic Agents, Insulin DependentObesity   Renal/GU negative Renal ROS     Musculoskeletal negative musculoskeletal ROS (+)   Abdominal   Peds  Hematology negative hematology ROS (+)   Anesthesia Other Findings Day of surgery medications reviewed with the patient.  Reproductive/Obstetrics                           Anesthesia Physical Anesthesia  Plan  ASA: III  Anesthesia Plan: MAC   Post-op Pain Management:    Induction: Intravenous  PONV Risk Score and Plan: 2 and Propofol infusion and Treatment may vary due to age or medical condition  Airway Management Planned: Nasal Cannula and Natural Airway  Additional Equipment:   Intra-op Plan:   Post-operative Plan:   Informed Consent: I have reviewed the patients History and Physical, chart, labs and discussed the procedure including the risks, benefits and alternatives for the proposed anesthesia with the patient or authorized representative who has indicated his/her understanding and acceptance.     Dental advisory given  Plan Discussed with: CRNA  Anesthesia Plan Comments:        Anesthesia Quick Evaluation

## 2020-11-01 NOTE — Transfer of Care (Signed)
Immediate Anesthesia Transfer of Care Note  Patient: Cheryl Blankenship  Procedure(s) Performed: ESOPHAGOGASTRODUODENOSCOPY (EGD) WITH PROPOFOL (N/A )  Patient Location: PACU  Anesthesia Type:MAC  Level of Consciousness: sedated  Airway & Oxygen Therapy: Patient Spontanous Breathing and Patient connected to face mask oxygen  Post-op Assessment: Report given to RN and Post -op Vital signs reviewed and stable  Post vital signs: Reviewed and stable  Last Vitals:  Vitals Value Taken Time  BP    Temp    Pulse 57 11/01/20 1112  Resp 12 11/01/20 1112  SpO2 100 % 11/01/20 1112  Vitals shown include unvalidated device data.  Last Pain:  Vitals:   11/01/20 1022  TempSrc: Oral  PainSc: 8       Patients Stated Pain Goal: 0 (35/70/17 7939)  Complications: No complications documented.

## 2020-11-01 NOTE — Progress Notes (Signed)
PROGRESS NOTE    Cheryl Blankenship  TMH:[id number] DOB: Mar 17, 1951 DOA: 10/30/2020 PCP: [name], PA-C   Brief Narrative: 70 year old female with history of breast cancer, symptomatic Covid infection in early January, type 2 diabetes mellitus with neuropathy, hypertension, diastolic CHF who has been admitted from cancer center with intractable chest pain for the past week.  As per the report She was seen in the ED on 10/26/2020 for chest wall pain which was present for a week at that time and work-up was negative for cardiac ischemia.  She was given a prescription for hydrocodone but did not yet fill the prescription and her pain had since worsened.  She continued to have pain across her upper chest rated 10/10 described as burning in nature worse with movement or palpation and presented to the ED this a.m. for the symptoms with associated nausea.  CTA chest was negative but did have slightly elevated troponin.  She was given Dilaudid IM with improvement in her symptoms and was discharged in stable condition with plans to follow-up at her already scheduled oncology appointment at 8 AM this morning.  Her oncologist discussed the pain with her cardiologist and this was not deemed to be cardiogenic in nature.  It was thought her pain was more related to severe acid reflux or musculoskeletal in nature but due to her intractable pain and difficulty controlling it at home and was given Phenergan in the office.  Her oncologist asked to have her admitted to the hospital for further evaluation.  Dr. Burr Medico had already consulted Dr. Alessandra Bevels, [facility], prior to arrival who will see in consult.  Subjective: Seen this morning.  She is waiting for endoscopy.  Complains of ongoing epigastric pain requesting IV morphine, as pills are not helping.  Assessment & Plan:  Intractable nausea/vomiting and uncontrolled Chest pain and abdominal pain:burning sensation/epigastric pain mostly since 10/26/2020 with no  significant bump in troponin-troponin negative on admission.  EKG nonischemic.  She had negative CT eval.  Seen by GI underwent EGD report as below suspecting chemotherapy since her symptoms started after her chemo.  Continue pain management will await for further input from her oncologist. Dr. Burr Medico had discussed with her cardiologist Dr. Johann Capers does not feel this is cardiac  XQJ:JHERD hiatal hernia,Gastritis.Normal duodenal bulb, first portion of the duodenum and second portion of the duodenum,suspect symptoms are most likely chemotherapy-related (negative labs, CT, endoscopy; symptoms all started after chemotherapy).  Altered mental status mild medication induced delirium In ED after Dilaudid and Phenergan in the ED. she is at baseline mental status.  Tolerating morphine/poopiates for pain.  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 Annamaria Boots was on neoadjuvant chemo and she is to [facility] on 12/21-poorly tolerated due to persistent nausea vomiting constipation abdominal pain chest pain insomnia-and discontinued and there is plan to start antiestrogen therapy and cont Herceptin q3 wk while waiting on consultation for breast surgery.  Managed by Dr. Annamaria Boots.  Await for further oncology input.  Mild hypercalcemia likely from malignancy.  Outpatient oncology follow-up  General body pain diarrhea nausea vomiting constipation: Continue symptomatic management.   Essential hypertension/HLD: BP is well controlled.  Continue her home meds held before EGD.  On amlodipine, carvedilol, on statins.  Chronic diastolic CHF not in exacerbation, continue Demedex, coreg,holding losartan.  Denies shortness of breath. Net IO Since Admission: -1,685 mL [11/01/20 1148]   Mild transaminitis.  Monitor  Type 2  diabetes mellitus without complication: Blood sugar is fairly controlled continue Lantus and sliding scale insulin.Has poorly  controlled hemoglobin A1c 10.3 Recent Labs  Lab 10/31/20 0746 10/31/20 1155 10/31/20 1639 10/31/20 2044 11/01/20 0738  GLUCAP 135* 101* 112* 140* 93   Covid positive in early January 2022, asymptomatic did not need treatment and tested negative later in January 2022  Morbid obesity BMI 36.  Patient PCP follow-up  Nutrition: Diet Order            Diet clear liquid Room service appropriate? Yes; Fluid consistency: Thin  Diet effective now                Pt's Body mass index is 36.98 kg/m.  DVT prophylaxis: enoxaparin (LOVENOX) injection 40 mg Start: 10/30/20 1800 Code Status:   Code Status: Full Code  Family Communication: plan of care discussed with patient at bedside.  Status VP:XTGGYIRS as Observation Patient remains hospitalized for ongoing management of her symptoms with plan for further gi eval and possible endoscopy in the morning. Dispo: The patient is from: Home              Anticipated d/c is to: Home              Anticipated d/c date is: 1 day              Patient currently is not medically stable to d/c.   Difficult to place patient No  Consultants:see note  Procedures:see note  Culture/Microbiology No results found for: SDES, SPECREQUEST, CULT, REPTSTATUS  Other culture-see note  Medications: Scheduled Meds: . amLODipine  10 mg Oral Daily  . aspirin  81 mg Oral Daily  . carvedilol  25 mg Oral BID WC  . Chlorhexidine Gluconate Cloth  6 each Topical Daily  . DULoxetine  60 mg Oral Daily  . enoxaparin (LOVENOX) injection  40 mg Subcutaneous Q24H  . insulin aspart  0-15 Units Subcutaneous TID WC  . insulin aspart  0-5 Units Subcutaneous QHS  . insulin glargine  25 Units Subcutaneous QHS  . ondansetron (ZOFRAN) IV  4 mg Intravenous Q6H  . pantoprazole (PROTONIX) IV  40 mg Intravenous Daily  . rosuvastatin  5 mg Oral QHS  . sucralfate  1 g Oral TID WC & HS  . torsemide  40 mg Oral Daily   Continuous Infusions:  Antimicrobials: Anti-infectives (From  admission, onward)   None     Objective: Vitals: Today's Vitals   11/01/20 1022 11/01/20 1114 11/01/20 1121 11/01/20 1135  BP: 139/68 (!) 93/53 115/87 (!) 154/84  Pulse: 64 (!) 58 62 61  Resp: 10 12 14 10   Temp: 98.7 F (37.1 C) 97.9 F (36.6 C)    TempSrc: Oral Axillary    SpO2: 96% 100% 96% 96%  Weight: 94.7 kg     Height: 5\' 3"  (1.6 m)     PainSc: 8  Asleep 8  8     Intake/Output Summary (Last 24 hours) at 11/01/2020 1148 Last data filed at 11/01/2020 1106 Gross per 24 hour  Intake 440 ml  Output 700 ml  Net -260 ml   Filed Weights   10/30/20 1443 11/01/20 1022  Weight: 94.7 kg 94.7 kg   Weight change:   Intake/Output from previous day: 02/12 0701 - 02/13 0700 In: 240 [P.O.:240] Out: 700 [Urine:700] Intake/Output this shift: Total I/O In: 200 [I.V.:200] Out: 0  Filed Weights   10/30/20 1443 11/01/20 1022  Weight: 94.7 kg 94.7 kg  Examination: General exam: AAOx3, weak, obese,weak appearing. HEENT:Oral mucosa moist, Ear/Nose WNL grossly, dentition normal. Respiratory system: bilaterally diminished at base,no wheezing or crackles,no use of accessory muscle Cardiovascular system: S1 & S2 +, No JVD,. Gastrointestinal system: Abdomen soft, tender epigastrium,ND, BS+ Nervous System:Alert, awake, moving extremities and grossly nonfocal Extremities: No edema, distal peripheral pulses palpable.  Skin: No rashes,no icterus. MSK: Normal muscle bulk,tone, power  Data Reviewed: I have personally reviewed following labs and imaging studies CBC: Recent Labs  Lab 10/26/20 1447 10/28/20 1427 10/30/20 1513 10/31/20 0400  WBC 6.9 6.5 6.7 7.1  NEUTROABS  --  3.2  --   --   HGB 12.9 12.3 13.1 13.1  HCT 40.1 37.6 39.8 39.9  MCV 87.6 84.3 84.5 84.0  PLT 104* 117* 155 532   Basic Metabolic Panel: Recent Labs  Lab 10/26/20 1447 10/28/20 1427 10/30/20 1513 10/30/20 1656 10/31/20 0400  NA 141 135 137  --  134*  K 3.6 3.7 3.0*  --  3.5  CL 100 97* 99  --  95*   CO2 26 30 28   --  28  GLUCOSE 241* 297* 191*  --  171*  BUN 21 11 17   --  19  CREATININE 1.33* 1.03* 0.86  --  0.94  CALCIUM 10.6* 10.7* 10.5*  --  10.5*  MG  --   --   --  1.8  --    GFR: Estimated Creatinine Clearance: 63.5 mL/min (by C-G formula based on SCr of 0.94 mg/dL). Liver Function Tests: Recent Labs  Lab 10/26/20 2235 10/28/20 1427 10/30/20 1513 10/31/20 0400  AST 38 33 50* 61*  ALT 29 27 49* 60*  ALKPHOS 67 71 60 60  BILITOT 0.8 0.6 0.9 0.8  PROT 7.9 7.9 7.5 7.6  ALBUMIN 3.9 3.7 3.7 3.8   Recent Labs  Lab 10/26/20 2235  LIPASE 24   No results for input(s): AMMONIA in the last 168 hours. Coagulation Profile: No results for input(s): INR, PROTIME in the last 168 hours. Cardiac Enzymes: No results for input(s): CKTOTAL, CKMB, CKMBINDEX, TROPONINI in the last 168 hours. BNP (last 3 results) No results for input(s): PROBNP in the last 8760 hours. HbA1C: Recent Labs    10/30/20 1513  HGBA1C 10.3*   CBG: Recent Labs  Lab 10/31/20 0746 10/31/20 1155 10/31/20 1639 10/31/20 2044 11/01/20 0738  GLUCAP 135* 101* 112* 140* 93   Lipid Profile: Recent Labs    10/30/20 1513  CHOL 134  HDL 38*  LDLCALC 63  TRIG 165*  CHOLHDL 3.5   Thyroid Function Tests: No results for input(s): TSH, T4TOTAL, FREET4, T3FREE, THYROIDAB in the last 72 hours. Anemia Panel: No results for input(s): VITAMINB12, FOLATE, FERRITIN, TIBC, IRON, RETICCTPCT in the last 72 hours. Sepsis Labs: No results for input(s): PROCALCITON, LATICACIDVEN in the last 168 hours.  Recent Results (from the past 240 hour(s))  SARS CORONAVIRUS 2 (TAT 6-24 HRS) Nasopharyngeal Nasopharyngeal Swab     Status: None   Collection Time: 10/31/20  2:15 PM   Specimen: Nasopharyngeal Swab  Result Value Ref Range Status   SARS Coronavirus 2 NEGATIVE NEGATIVE Final    Comment: (NOTE) SARS-CoV-2 target nucleic acids are NOT DETECTED.  The SARS-CoV-2 RNA is generally detectable in upper and  lower respiratory specimens during the acute phase of infection. Negative results do not preclude SARS-CoV-2 infection, do not rule out co-infections with other pathogens, and should not be used as the sole basis for treatment or other patient management decisions. Negative  results must be combined with clinical observations, patient history, and epidemiological information. The expected result is Negative.  Fact Sheet for Patients: SugarRoll.be  Fact Sheet for Healthcare Providers: https://www.woods-mathews.com/  This test is not yet approved or cleared by the [place] FDA and  has been authorized for detection and/or diagnosis of SARS-CoV-2 by FDA under an Emergency Use Authorization (EUA). This EUA will remain  in effect (meaning this test can be used) for the duration of the COVID-19 declaration under Se ction 564(b)(1) of the Act, 21 U.S.C. section 360bbb-3(b)(1), unless the authorization is terminated or revoked sooner.  Performed at [facility], [facility] [address], [place], [place] [address]      Radiology Studies: CT ABDOMEN PELVIS WO CONTRAST  Result Date: 10/31/2020 CLINICAL DATA:  Abdominal distension. Recent diagnosis of breast cancer. EXAM: CT ABDOMEN AND PELVIS WITHOUT CONTRAST TECHNIQUE: Multidetector CT imaging of the abdomen and pelvis was performed following the standard protocol without IV contrast. COMPARISON:  08/07/2020 FINDINGS: Lower chest: Linear atelectasis in the left lower lobe. Hepatobiliary: Liver appears normal without contrast. Previous cholecystectomy. Pancreas: Normal Spleen: Normal Adrenals/Urinary Tract: Adrenal glands are normal. Kidneys appear normal without contrast. No sign of obstruction. Bladder is normal. Stomach/Bowel: Stomach is normal. Small bowel pattern is normal. No small bowel obstruction. Large amount of stool and gas in the right colon and gas in the transverse colon. Left colon is  normal. Rectum is normal. Vascular/Lymphatic: Aortic atherosclerosis. No aneurysm. IVC is normal. No retroperitoneal adenopathy. Reproductive: No pelvic mass. Small amount of fluid in the endometrial canal as seen previously. Other: No free fluid or air. Musculoskeletal: Umbilical hernia containing only fat. Chronic degenerative changes of the mid to lower lumbar spine. IMPRESSION: 1. No acute finding by CT other than a large amount of stool and gas in the right colon and gas in the transverse colon. No small bowel obstruction. 2. Previous cholecystectomy. 3. Aortic atherosclerosis. 4. Umbilical hernia containing only fat. 5. Small amount of fluid in the endometrial canal as seen previously. Aortic Atherosclerosis (ICD10-I70.0). Electronically Signed   By: Nelson Chimes M.D.   On: 10/31/2020 01:33   DG Abd 1 View  Result Date: 10/30/2020 CLINICAL DATA:  Abdominal pain and distension. EXAM: ABDOMEN - 1 VIEW COMPARISON:  None. FINDINGS: The visualized lung bases are grossly clear.  No pleural effusions. Moderate air throughout the small bowel and colon which may suggest an ileus. No free air. The bony structures are intact. IMPRESSION: Possible ileus. Electronically Signed   By: Marijo Sanes M.D.   On: 10/30/2020 16:28     LOS: 1 day   Antonieta Pert, MD [facility]  11/01/2020, 11:48 AM

## 2020-11-01 NOTE — Op Note (Signed)
[facility] Patient Name: Cheryl Blankenship Procedure Date: 11/01/2020 MRN: [id number] Attending MD: Arta Silence , MD Date of Birth: [birth year] CSN: [id number] Age: 70 Admit Type: Inpatient Procedure:                Upper GI endoscopy Indications:              Upper abdominal pain, Nausea with vomiting Providers:                Arta Silence, MD, Particia Nearing, RN, Laverda Sorenson, Technician, Marla Roe, CRNA Referring MD:              Medicines:                Monitored Anesthesia Care Complications:            No immediate complications. Estimated Blood Loss:     Estimated blood loss: none. Procedure:                Pre-Anesthesia Assessment:                           - Prior to the procedure, a History and Physical                            was performed, and patient medications and                            allergies were reviewed. The patient's tolerance of                            previous anesthesia was also reviewed. The risks                            and benefits of the procedure and the sedation                            options and risks were discussed with the patient.                            All questions were answered, and informed consent                            was obtained. Prior Anticoagulants: The patient has                            taken no previous anticoagulant or antiplatelet                            agents. ASA Grade Assessment: III - A patient with                            severe systemic disease. After reviewing the risks  and benefits, the patient was deemed in                            satisfactory condition to undergo the procedure.                           After obtaining informed consent, the endoscope was                            passed under direct vision. Throughout the                            procedure, the patient's blood pressure, pulse, and                             oxygen saturations were monitored continuously. The                            [id number]-[id number] ([id number]) Olympus gastroscope was                            introduced through the mouth, and advanced to the                            second part of duodenum. The upper GI endoscopy was                            accomplished without difficulty. The patient                            tolerated the procedure well. Scope In: Scope Out: Findings:      A small hiatal hernia was present.      The exam of the esophagus was otherwise normal.      Patchy mild inflammation was found in the prepyloric region of the       stomach.      The exam of the stomach was otherwise normal.      The duodenal bulb, first portion of the duodenum and second portion of       the duodenum were normal. Impression:               - Small hiatal hernia.                           - Gastritis.                           - Normal duodenal bulb, first portion of the                            duodenum and second portion of the duodenum.                           - Suspect symptoms are most likely  chemotherapy-related (negative labs, CT, endoscopy;                            symptoms all started after chemotherapy). Moderate Sedation:      None Recommendation:           - Return patient to hospital ward for ongoing care.                           - Clear liquid diet.                           - Continue present medications.                           - Check H. pylori serologies today.                           - [facility] will follow-up H. pylori studies but                            otherwise sign-off.                           - Do not anticipate need for further GI testing. Procedure Code(s):        --- Professional ---                           [phone], Esophagogastroduodenoscopy, flexible,                            transoral; diagnostic, including collection of                             specimen(s) by brushing or washing, when performed                            (separate procedure) Diagnosis Code(s):        --- Professional ---                           K44.9, Diaphragmatic hernia without obstruction or                            gangrene                           K29.70, Gastritis, unspecified, without bleeding                           R10.10, Upper abdominal pain, unspecified                           R11.2, Nausea with vomiting, unspecified CPT copyright 2019 American Medical Association. All rights reserved. The codes documented in this report are preliminary and upon coder review may  be revised to meet current compliance requirements. Arta Silence, MD 11/01/2020 11:16:15 AM This report has been signed electronically.  Number of Addenda: 0 

## 2020-11-01 NOTE — Anesthesia Postprocedure Evaluation (Signed)
Anesthesia Post Note  Patient: Desiraye Rolfson  Procedure(s) Performed: ESOPHAGOGASTRODUODENOSCOPY (EGD) WITH PROPOFOL (N/A )     Patient location during evaluation: Endoscopy Anesthesia Type: MAC Level of consciousness: awake and alert Pain management: pain level controlled Vital Signs Assessment: post-procedure vital signs reviewed and stable Respiratory status: spontaneous breathing, nonlabored ventilation and respiratory function stable Cardiovascular status: stable and blood pressure returned to baseline Postop Assessment: no apparent nausea or vomiting Anesthetic complications: no   No complications documented.  Last Vitals:  Vitals:   11/01/20 1201 11/01/20 1228  BP: (!) 148/93 (!) 148/93  Pulse: 60   Resp: 17   Temp: 36.6 C   SpO2: 96%     Last Pain:  Vitals:   11/01/20 1201  TempSrc: Oral  PainSc:                  Catalina Gravel

## 2020-11-01 NOTE — Interval H&P Note (Signed)
History and Physical Interval Note:  11/01/2020 10:50 AM  Cheryl Blankenship  has presented today for surgery, with the diagnosis of nausea, vomiting, abdominal pain.  The various methods of treatment have been discussed with the patient and family. After consideration of risks, benefits and other options for treatment, the patient has consented to  Procedure(s): ESOPHAGOGASTRODUODENOSCOPY (EGD) WITH PROPOFOL (N/A) as a surgical intervention.  The patient's history has been reviewed, patient examined, no change in status, stable for surgery.  I have reviewed the patient's chart and labs.  Questions were answered to the patient's satisfaction.     Landry Dyke

## 2020-11-02 ENCOUNTER — Telehealth: Payer: Self-pay | Admitting: Hematology

## 2020-11-02 ENCOUNTER — Encounter (HOSPITAL_COMMUNITY): Payer: Self-pay | Admitting: Gastroenterology

## 2020-11-02 DIAGNOSIS — R079 Chest pain, unspecified: Secondary | ICD-10-CM

## 2020-11-02 DIAGNOSIS — I10 Essential (primary) hypertension: Secondary | ICD-10-CM | POA: Diagnosis not present

## 2020-11-02 LAB — CBC
HCT: 38.2 % (ref 36.0–46.0)
Hemoglobin: 12.4 g/dL (ref 12.0–15.0)
MCH: 27.6 pg (ref 26.0–34.0)
MCHC: 32.5 g/dL (ref 30.0–36.0)
MCV: 85.1 fL (ref 80.0–100.0)
Platelets: 192 10*3/uL (ref 150–400)
RBC: 4.49 MIL/uL (ref 3.87–5.11)
RDW: 13 % (ref 11.5–15.5)
WBC: 6.5 10*3/uL (ref 4.0–10.5)
nRBC: 0 % (ref 0.0–0.2)

## 2020-11-02 LAB — COMPREHENSIVE METABOLIC PANEL
ALT: 42 U/L (ref 0–44)
AST: 37 U/L (ref 15–41)
Albumin: 3.5 g/dL (ref 3.5–5.0)
Alkaline Phosphatase: 51 U/L (ref 38–126)
Anion gap: 10 (ref 5–15)
BUN: 32 mg/dL — ABNORMAL HIGH (ref 8–23)
CO2: 29 mmol/L (ref 22–32)
Calcium: 10.1 mg/dL (ref 8.9–10.3)
Chloride: 96 mmol/L — ABNORMAL LOW (ref 98–111)
Creatinine, Ser: 1.49 mg/dL — ABNORMAL HIGH (ref 0.44–1.00)
GFR, Estimated: 38 mL/min — ABNORMAL LOW (ref >60)
Glucose, Bld: 109 mg/dL — ABNORMAL HIGH (ref 70–99)
Potassium: 3.2 mmol/L — ABNORMAL LOW (ref 3.5–5.1)
Sodium: 135 mmol/L (ref 135–145)
Total Bilirubin: 0.7 mg/dL (ref 0.3–1.2)
Total Protein: 6.9 g/dL (ref 6.5–8.1)

## 2020-11-02 LAB — GLUCOSE, CAPILLARY
Glucose-Capillary: 114 mg/dL — ABNORMAL HIGH (ref 70–99)
Glucose-Capillary: 111 mg/dL — ABNORMAL HIGH (ref 70–99)
Glucose-Capillary: 101 mg/dL — ABNORMAL HIGH (ref 70–99)
Glucose-Capillary: 161 mg/dL — ABNORMAL HIGH (ref 70–99)

## 2020-11-02 LAB — LIPASE, BLOOD: Lipase: 22 U/L (ref 11–51)

## 2020-11-02 MED ORDER — POLYETHYLENE GLYCOL 3350 17 G PO PACK
17.0000 g | PACK | Freq: Every day | ORAL | Status: DC
  Administered 2020-11-02 – 2020-11-03 (×2): 17 g via ORAL
  Filled 2020-11-02 (×2): qty 1

## 2020-11-02 MED ORDER — LACTULOSE 10 GM/15ML PO SOLN
30.0000 g | ORAL | Status: DC | PRN
  Administered 2020-11-02 – 2020-11-03 (×3): 30 g via ORAL
  Filled 2020-11-02 (×3): qty 45

## 2020-11-02 MED ORDER — SODIUM CHLORIDE 0.9 % IV BOLUS
500.0000 mL | Freq: Once | INTRAVENOUS | Status: AC
  Administered 2020-11-02: 500 mL via INTRAVENOUS

## 2020-11-02 MED ORDER — POTASSIUM CHLORIDE CRYS ER 20 MEQ PO TBCR
40.0000 meq | EXTENDED_RELEASE_TABLET | Freq: Once | ORAL | Status: AC
  Administered 2020-11-02: 40 meq via ORAL
  Filled 2020-11-02: qty 2

## 2020-11-02 MED ORDER — SENNOSIDES-DOCUSATE SODIUM 8.6-50 MG PO TABS
2.0000 | ORAL_TABLET | Freq: Two times a day (BID) | ORAL | Status: DC
  Administered 2020-11-02 – 2020-11-04 (×5): 2 via ORAL
  Filled 2020-11-02 (×5): qty 2

## 2020-11-02 MED ORDER — LACTATED RINGERS IV SOLN
INTRAVENOUS | Status: DC
  Administered 2020-11-02: 1000 mL via INTRAVENOUS

## 2020-11-02 MED ORDER — FLEET ENEMA 7-19 GM/118ML RE ENEM
1.0000 | ENEMA | Freq: Once | RECTAL | Status: AC
  Administered 2020-11-02: 1 via RECTAL
  Filled 2020-11-02: qty 1

## 2020-11-02 NOTE — Progress Notes (Signed)
Bunny Kleist   DOB:26-Oct-1950   PY#:[id number]   TIW#:[id number]  Oncology follow up   Subjective: Patient underwent EGD over the weekend.  Her nausea and vomiting has improved, but she complains significant abdominal pain, and bloating, she has not had BM for a week. She did received iv and oral morphine, which helped her pain but did not last long.   Objective:  Vitals:   11/02/20 1000 11/02/20 1011  BP: 118/76 118/76  Pulse: 60   Resp:    Temp:    SpO2: 100%     Body mass index is 36.98 kg/m.  Intake/Output Summary (Last 24 hours) at 11/02/2020 1242 Last data filed at 11/01/2020 1800 Gross per 24 hour  Intake 580 ml  Output 300 ml  Net 280 ml     Sclerae unicteric  Oropharynx clear  No peripheral adenopathy  Lungs clear -- no rales or rhonchi  Heart regular rate and rhythm  Abdomen slightly distended, diffuse moderate tenderness no rebound pain  MSK no focal spinal tenderness, no peripheral edema  Neuro nonfocal    CBG (last 3)  Recent Labs    11/01/20 2042 11/02/20 0751 11/02/20 1142  GLUCAP 120* 114* 111*     Labs:   Urine Studies No results for input(s): UHGB, CRYS in the last 72 hours.  Invalid input(s): UACOL, UAPR, USPG, UPH, UTP, UGL, [place], UBIL, UNIT, UROB, [place], UEPI, UWBC, Junie Panning [place], [place], [place]  Basic Metabolic Panel: Recent Labs  Lab 10/26/20 1447 10/28/20 1427 10/30/20 1513 10/30/20 1656 10/31/20 0400 11/02/20 0517  NA 141 135 137  --  134* 135  K 3.6 3.7 3.0*  --  3.5 3.2*  CL 100 97* 99  --  95* 96*  CO2 26 30 28   --  28 29  GLUCOSE 241* 297* 191*  --  171* 109*  BUN 21 11 17   --  19 32*  CREATININE 1.33* 1.03* 0.86  --  0.94 1.49*  CALCIUM 10.6* 10.7* 10.5*  --  10.5* 10.1  MG  --   --   --  1.8  --   --    GFR Estimated Creatinine Clearance: 40.1 mL/min (A) (by C-G formula based on SCr of 1.49 mg/dL (H)). Liver Function Tests: Recent Labs  Lab 10/26/20 2235 10/28/20 1427 10/30/20 1513 10/31/20 0400  11/02/20 0517  AST 38 33 50* 61* 37  ALT 29 27 49* 60* 42  ALKPHOS 67 71 60 60 51  BILITOT 0.8 0.6 0.9 0.8 0.7  PROT 7.9 7.9 7.5 7.6 6.9  ALBUMIN 3.9 3.7 3.7 3.8 3.5   Recent Labs  Lab 10/26/20 2235 11/02/20 0517  LIPASE 24 22   No results for input(s): AMMONIA in the last 168 hours. Coagulation profile No results for input(s): INR, PROTIME in the last 168 hours.  CBC: Recent Labs  Lab 10/26/20 1447 10/28/20 1427 10/30/20 1513 10/31/20 0400 11/02/20 0517  WBC 6.9 6.5 6.7 7.1 6.5  NEUTROABS  --  3.2  --   --   --   HGB 12.9 12.3 13.1 13.1 12.4  HCT 40.1 37.6 39.8 39.9 38.2  MCV 87.6 84.3 84.5 84.0 85.1  PLT 104* 117* 155 169 192   Cardiac Enzymes: No results for input(s): CKTOTAL, CKMB, CKMBINDEX, TROPONINI in the last 168 hours. BNP: Invalid input(s): POCBNP CBG: Recent Labs  Lab 11/01/20 1200 11/01/20 1637 11/01/20 2042 11/02/20 0751 11/02/20 1142  GLUCAP 88 102* 120* 114* 111*   D-Dimer No results for input(s):  DDIMER in the last 72 hours. Hgb A1c Recent Labs    10/30/20 1513  HGBA1C 10.3*   Lipid Profile Recent Labs    10/30/20 1513  CHOL 134  HDL 38*  LDLCALC 63  TRIG 165*  CHOLHDL 3.5   Thyroid function studies No results for input(s): TSH, T4TOTAL, T3FREE, THYROIDAB in the last 72 hours.  Invalid input(s): FREET3 Anemia work up No results for input(s): VITAMINB12, FOLATE, FERRITIN, TIBC, IRON, RETICCTPCT in the last 72 hours. Microbiology Recent Results (from the past 240 hour(s))  SARS CORONAVIRUS 2 (TAT 6-24 HRS) Nasopharyngeal Nasopharyngeal Swab     Status: None   Collection Time: 10/31/20  2:15 PM   Specimen: Nasopharyngeal Swab  Result Value Ref Range Status   SARS Coronavirus 2 NEGATIVE NEGATIVE Final    Comment: (NOTE) SARS-CoV-2 target nucleic acids are NOT DETECTED.  The SARS-CoV-2 RNA is generally detectable in upper and lower respiratory specimens during the acute phase of infection. Negative results do not preclude  SARS-CoV-2 infection, do not rule out co-infections with other pathogens, and should not be used as the sole basis for treatment or other patient management decisions. Negative results must be combined with clinical observations, patient history, and epidemiological information. The expected result is Negative.  Fact Sheet for Patients: SugarRoll.be  Fact Sheet for Healthcare Providers: https://www.woods-mathews.com/  This test is not yet approved or cleared by the [place] FDA and  has been authorized for detection and/or diagnosis of SARS-CoV-2 by FDA under an Emergency Use Authorization (EUA). This EUA will remain  in effect (meaning this test can be used) for the duration of the COVID-19 declaration under Se ction 564(b)(1) of the Act, 21 U.S.C. section 360bbb-3(b)(1), unless the authorization is terminated or revoked sooner.  Performed at [facility], [place] [address], [place], [place] [address]       Studies:  No results found.  Assessment: 70 y.o.  Female on neoadjuvant chemotherapy for breast cancer, admitted for intractable nausea vomiting and chest pain  1.  Intractable nausea and vomiting, secondary to chemotherapy, improved 2.  Severe chest pain, unclear etiology, improved 3.  Diffuse abdominal pain, likely secondary to constipation 4.  Stage II left breast cancer, on neoadjuvant chemo, with poor tolerance  5.  Poorly controlled diabetes 6. CHF, hypertension  Plan:  -I reviewed her EGD findings -I also reviewed her recent CT scan abdomen pelvis, which showed a large volume of stool.  Her abdominal pain is related to constipation.  I ordered Fleet enema once today, and lactulose every 4 hours as needed.  She has good bowel movement.  Patient agrees to try -will try to avoid narcotics, tried to explain to pt that it will cause more constipation, ok to use oral morphine for severe pain  -her N/V has much  improved -if her pain is much improved after BM, OK to discharge tomorrow if clinically stable.    [name], MD 11/02/2020  12:42 PM

## 2020-11-02 NOTE — Telephone Encounter (Signed)
Checked out appointment. No LOS notes. No changes made.

## 2020-11-02 NOTE — Progress Notes (Signed)
PROGRESS NOTE    Cheryl Blankenship  DPO:[id number] DOB: Jan 02, 1951 DOA: 10/30/2020 PCP: [name], PA-C   Brief Narrative: 70 year old female with history of breast cancer, symptomatic Covid infection in early January, type 2 diabetes mellitus with neuropathy, hypertension, diastolic CHF who has been admitted from cancer center with intractable chest pain for the past week.  As per the report She was seen in the ED on 10/26/2020 for chest wall pain which was present for a week at that time and work-up was negative for cardiac ischemia.  She was given a prescription for hydrocodone but did not yet fill the prescription and her pain had since worsened.  She continued to have pain across her upper chest rated 10/10 described as burning in nature worse with movement or palpation and presented to the ED this a.m. for the symptoms with associated nausea.  CTA chest was negative but did have slightly elevated troponin.  She was given Dilaudid IM with improvement in her symptoms and was discharged in stable condition with plans to follow-up at her already scheduled oncology appointment at 8 AM this morning.  Her oncologist discussed the pain with her cardiologist and this was not deemed to be cardiogenic in nature.  It was thought her pain was more related to severe acid reflux or musculoskeletal in nature but due to her intractable pain and difficulty controlling it at home and was given Phenergan in the office.  Her oncologist asked to have her admitted to the hospital for further evaluation.  Dr. Burr Medico had already consulted Dr. Alessandra Bevels, [facility], prior to arrival who will see in consult. Patient was seen by GI underwent endoscopy unremarkable, patient continues to complain of pain and being managed with pain medicine.  Subjective:  Afebrile overnight but creatinine has trended up. Patient was started on morphine sulfate orally 2/13 Patient reports of ongoing pain issues and requesting for  more pain medication Complains of constipation would like to try enema  Assessment & Plan:  Intractable nausea/vomiting and uncontrolled Chest pain and abdominal pain:burning sensation/epigastric pain mostly since 10/26/2020 with no significant bump in troponin-troponin negative on admission.  EKG nonischemic.  She had negative CT chest eval. Dr. Burr Medico had discussed with her cardiologist Dr. Johann Capers does not feel this is cardiac.Seen by GI underwent EGD-EGD:Small hiatal hernia,Gastritis.Normal duodenal bulb, first portion of the duodenum and second portion of the duodenum,suspect symptoms are most likely chemotherapy-related (negative labs, CT, endoscopy; symptoms all started after chemotherapy). Suspecting chemotherapy related symptoms, discussed with Dr. Burr Medico started on morphine sulfate 7.5 mg q4hr prn PO for ongoing pain management.Cymbalta dose has been doubles as well to see if it may help this pain as it could be neuropathic pain. Continue PPI/Maalox and other supportive management.   AKI bump in creatinine noted likely in the setting of intractable nausea vomiting decreased oral intake and also Demadex/losartan, we will bolus with IV fluids 500 ml and keep on maintenance fluid monitor renal function closely.  I will stop the Demadex/ARBs. Recent Labs  Lab 10/26/20 1447 10/28/20 1427 10/30/20 1513 10/31/20 0400 11/02/20 0517  BUN 21 11 17 19  32*  CREATININE 1.33* 1.03* 0.86 0.94 1.49*   Hypokalemia  2/2 # 1. being repleted  Constipation ongoing on senna Dulcolax, will add MiraLAX daily and enema x1.  CT abdomen on admission showed a large amount of stool and gas in the right colon  Altered mental status mild medication induced delirium In ED after Dilaudid and Phenergan in the ED. remains  a stable tolerating morphine.   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 Annamaria Boots was on neoadjuvant chemo and she  is to [facility] on 12/21-poorly tolerated due to persistent nausea vomiting constipation abdominal pain chest pain insomnia-and discontinued and there is plan to start antiestrogen therapy and cont Herceptin q3 wk while waiting on consultation for breast surgery.  Managed by Dr. Burr Medico who will be seeing today  Mild hypercalcemia likely from malignancy.  Calcium improved to 10.1.  Outpatient oncology follow-up  General body pain diarrhea nausea vomiting constipation: On symptomatic management..   Essential hypertension/HLD: BP well controlled continue amlodipine carvedilol.  Holding diuretics and losartan due to AKI.  Continue statin.    Chronic diastolic CHF not in exacerbation, holding Demadex and losartan due to AKI watch for fluid overload. Net IO Since Admission: -1,405 mL [11/02/20 0824]   Mild transaminitis.  Monitor  Type 2 diabetes mellitus without complication: Blood sugar is controlled continue current Lantus and sliding scale insulin.Has poorly controlled hemoglobin A1c 10.3 Recent Labs  Lab 11/01/20 0738 11/01/20 1200 11/01/20 1637 11/01/20 2042 11/02/20 0751  GLUCAP 93 88 102* 120* 114*   Covid positive in early January 2022, asymptomatic did not need treatment and tested negative later in January 2022  Morbid obesity BMI 36.  Patient PCP follow-up for weight loss strategy.  Nutrition: Diet Order            Diet clear liquid Room service appropriate? Yes; Fluid consistency: Thin  Diet effective now                Pt's Body mass index is 36.98 kg/m.  DVT prophylaxis: enoxaparin (LOVENOX) injection 40 mg Start: 10/30/20 1800 Code Status:   Code Status: Full Code  Family Communication: plan of care discussed with patient at bedside.  Status is: Inpatient  patient remains hospitalized for ongoing management of her symptoms and now with AKI. Dispo: The patient is from: Home              Anticipated d/c is to: Home              Anticipated d/c date is: 1 day once  renal function is stable and pain is controlled              Patient currently is not medically stable to d/c.   Difficult to place patient No  Consultants:see note  Procedures:see note  Culture/Microbiology No results found for: SDES, SPECREQUEST, CULT, REPTSTATUS  Other culture-see note  Medications: Scheduled Meds: . amLODipine  10 mg Oral Daily  . aspirin  81 mg Oral Daily  . carvedilol  25 mg Oral BID WC  . Chlorhexidine Gluconate Cloth  6 each Topical Daily  . DULoxetine  60 mg Oral Daily  . enoxaparin (LOVENOX) injection  40 mg Subcutaneous Q24H  . insulin aspart  0-15 Units Subcutaneous TID WC  . insulin aspart  0-5 Units Subcutaneous QHS  . insulin glargine  25 Units Subcutaneous QHS  . ondansetron (ZOFRAN) IV  4 mg Intravenous Q6H  . pantoprazole (PROTONIX) IV  40 mg Intravenous Daily  . potassium chloride  40 mEq Oral Once  . rosuvastatin  5 mg Oral QHS  . sucralfate  1 g Oral TID WC & HS   Continuous Infusions: . lactated ringers    . sodium chloride      Antimicrobials: Anti-infectives (From admission, onward)   None  Objective: Vitals: Today's Vitals   11/01/20 2204 11/01/20 2249 11/02/20 0454 11/02/20 0552  BP:   138/89   Pulse:   (!) 59   Resp:   16   Temp:   97.8 F (36.6 C)   TempSrc:   Oral   SpO2:   97%   Weight:      Height:      PainSc: 7  2   Asleep    Intake/Output Summary (Last 24 hours) at 11/02/2020 0824 Last data filed at 11/01/2020 1800 Gross per 24 hour  Intake 780 ml  Output 300 ml  Net 480 ml   Filed Weights   10/30/20 1443 11/01/20 1022  Weight: 94.7 kg 94.7 kg   Weight change:   Intake/Output from previous day: 02/13 0701 - 02/14 0700 In: 780 [P.O.:580; I.V.:200] Out: 300 [Urine:300] Intake/Output this shift: No intake/output data recorded. Filed Weights   10/30/20 1443 11/01/20 1022  Weight: 94.7 kg 94.7 kg    Examination: General exam: AAOx3,NAD, weak appearing. HEENT:Oral mucosa moist, Ear/Nose WNL  grossly, dentition normal. Respiratory system: bilaterally clear,no wheezing or crackles,no use of accessory muscle Cardiovascular system: S1 & S2 +, No JVD,. Gastrointestinal system: Abdomen soft,Tender epigastrium, full/obese abdomen,BS+ Nervous System:Alert, awake, moving extremities and grossly nonfocal Extremities: No edema, distal peripheral pulses palpable.  Skin: No rashes,no icterus. MSK: Normal muscle bulk,tone, power  Data Reviewed: I have personally reviewed following labs and imaging studies CBC: Recent Labs  Lab 10/26/20 1447 10/28/20 1427 10/30/20 1513 10/31/20 0400 11/02/20 0517  WBC 6.9 6.5 6.7 7.1 6.5  NEUTROABS  --  3.2  --   --   --   HGB 12.9 12.3 13.1 13.1 12.4  HCT 40.1 37.6 39.8 39.9 38.2  MCV 87.6 84.3 84.5 84.0 85.1  PLT 104* 117* 155 169 169   Basic Metabolic Panel: Recent Labs  Lab 10/26/20 1447 10/28/20 1427 10/30/20 1513 10/30/20 1656 10/31/20 0400 11/02/20 0517  NA 141 135 137  --  134* 135  K 3.6 3.7 3.0*  --  3.5 3.2*  CL 100 97* 99  --  95* 96*  CO2 26 30 28   --  28 29  GLUCOSE 241* 297* 191*  --  171* 109*  BUN 21 11 17   --  19 32*  CREATININE 1.33* 1.03* 0.86  --  0.94 1.49*  CALCIUM 10.6* 10.7* 10.5*  --  10.5* 10.1  MG  --   --   --  1.8  --   --    GFR: Estimated Creatinine Clearance: 40.1 mL/min (A) (by C-G formula based on SCr of 1.49 mg/dL (H)). Liver Function Tests: Recent Labs  Lab 10/26/20 2235 10/28/20 1427 10/30/20 1513 10/31/20 0400 11/02/20 0517  AST 38 33 50* 61* 37  ALT 29 27 49* 60* 42  ALKPHOS 67 71 60 60 51  BILITOT 0.8 0.6 0.9 0.8 0.7  PROT 7.9 7.9 7.5 7.6 6.9  ALBUMIN 3.9 3.7 3.7 3.8 3.5   Recent Labs  Lab 10/26/20 2235  LIPASE 24   No results for input(s): AMMONIA in the last 168 hours. Coagulation Profile: No results for input(s): INR, PROTIME in the last 168 hours. Cardiac Enzymes: No results for input(s): CKTOTAL, CKMB, CKMBINDEX, TROPONINI in the last 168 hours. BNP (last 3 results) No  results for input(s): PROBNP in the last 8760 hours. HbA1C: Recent Labs    10/30/20 1513  HGBA1C 10.3*   CBG: Recent Labs  Lab 11/01/20 0738 11/01/20 1200 11/01/20 1637 11/01/20  2042 11/02/20 0751  GLUCAP 93 88 102* 120* 114*   Lipid Profile: Recent Labs    10/30/20 1513  CHOL 134  HDL 38*  LDLCALC 63  TRIG 165*  CHOLHDL 3.5   Thyroid Function Tests: No results for input(s): TSH, T4TOTAL, FREET4, T3FREE, THYROIDAB in the last 72 hours. Anemia Panel: No results for input(s): VITAMINB12, FOLATE, FERRITIN, TIBC, IRON, RETICCTPCT in the last 72 hours. Sepsis Labs: No results for input(s): PROCALCITON, LATICACIDVEN in the last 168 hours.  Recent Results (from the past 240 hour(s))  SARS CORONAVIRUS 2 (TAT 6-24 HRS) Nasopharyngeal Nasopharyngeal Swab     Status: None   Collection Time: 10/31/20  2:15 PM   Specimen: Nasopharyngeal Swab  Result Value Ref Range Status   SARS Coronavirus 2 NEGATIVE NEGATIVE Final    Comment: (NOTE) SARS-CoV-2 target nucleic acids are NOT DETECTED.  The SARS-CoV-2 RNA is generally detectable in upper and lower respiratory specimens during the acute phase of infection. Negative results do not preclude SARS-CoV-2 infection, do not rule out co-infections with other pathogens, and should not be used as the sole basis for treatment or other patient management decisions. Negative results must be combined with clinical observations, patient history, and epidemiological information. The expected result is Negative.  Fact Sheet for Patients: SugarRoll.be  Fact Sheet for Healthcare Providers: https://www.woods-mathews.com/  This test is not yet approved or cleared by the [place] FDA and  has been authorized for detection and/or diagnosis of SARS-CoV-2 by FDA under an Emergency Use Authorization (EUA). This EUA will remain  in effect (meaning this test can be used) for the duration of the COVID-19  declaration under Se ction 564(b)(1) of the Act, 21 U.S.C. section 360bbb-3(b)(1), unless the authorization is terminated or revoked sooner.  Performed at [facility], [place] [address], [place], [place] [address]      Radiology Studies: No results found.   LOS: 2 days   Antonieta Pert, MD [facility]  11/02/2020, 8:24 AM

## 2020-11-02 NOTE — Progress Notes (Signed)
Chaplain engaged in initial visit with Cheryl Blankenship.  Chaplain introduced herself and offered support.  Cheryl Blankenship expressed that she was in pain and then doctor arrived.  Chaplain will follow-up.     11/02/20 1200  Clinical Encounter Type  Visited With Patient  Visit Type Initial

## 2020-11-02 NOTE — Progress Notes (Signed)
Pharmacist Chemotherapy Monitoring - Initial Assessment    Anticipated start date: 11/09/20   Regimen:  . Are orders appropriate based on the patient's diagnosis, regimen, and cycle? Yes . Does the plan date match the patient's scheduled date? Yes . Is the sequencing of drugs appropriate? Yes . Are the premedications appropriate for the patient's regimen? Yes . Prior Authorization for treatment is: Pending o If applicable, is the correct biosimilar selected based on the patient's insurance? not applicable  Organ Function and Labs: Marland Kitchen Are dose adjustments needed based on the patient's renal function, hepatic function, or hematologic function? Yes . Are appropriate labs ordered prior to the start of patient's treatment? Yes . Other organ system assessment, if indicated: trastuzumab: Echo/ MUGA . The following baseline labs, if indicated, have been ordered: N/A  Dose Assessment: . Are the drug doses appropriate? Yes . Are the following correct: o Drug concentrations Yes o IV fluid compatible with drug Yes o Administration routes Yes o Timing of therapy Yes . If applicable, does the patient have documented access for treatment and/or plans for port-a-cath placement? not applicable . If applicable, have lifetime cumulative doses been properly documented and assessed? not applicable Lifetime Dose Tracking  No doses have been documented on this patient for the following tracked chemicals: Doxorubicin, Epirubicin, Idarubicin, Daunorubicin, Mitoxantrone, Bleomycin, Oxaliplatin, Carboplatin, Liposomal Doxorubicin  o   Toxicity Monitoring/Prevention: . The patient has the following take home antiemetics prescribed: N/A . The patient has the following take home medications prescribed: N/A . Medication allergies and previous infusion related reactions, if applicable, have been reviewed and addressed. Yes . The patient's current medication list has been assessed for drug-drug interactions with  their chemotherapy regimen. no significant drug-drug interactions were identified on review.  Order Review: . Are the treatment plan orders signed? Yes . Is the patient scheduled to see a provider prior to their treatment? Yes  I verify that I have reviewed each item in the above checklist and answered each question accordingly.  Cheryl Blankenship, RPH, 11/02/2020  1:16 PM

## 2020-11-03 ENCOUNTER — Other Ambulatory Visit: Payer: Self-pay | Admitting: Hematology

## 2020-11-03 DIAGNOSIS — K5900 Constipation, unspecified: Secondary | ICD-10-CM | POA: Diagnosis not present

## 2020-11-03 DIAGNOSIS — R079 Chest pain, unspecified: Secondary | ICD-10-CM | POA: Diagnosis not present

## 2020-11-03 LAB — CBC
HCT: 36.6 % (ref 36.0–46.0)
Hemoglobin: 12 g/dL (ref 12.0–15.0)
MCH: 28.1 pg (ref 26.0–34.0)
MCHC: 32.8 g/dL (ref 30.0–36.0)
MCV: 85.7 fL (ref 80.0–100.0)
Platelets: 204 10*3/uL (ref 150–400)
RBC: 4.27 MIL/uL (ref 3.87–5.11)
RDW: 12.9 % (ref 11.5–15.5)
WBC: 7.8 10*3/uL (ref 4.0–10.5)
nRBC: 0 % (ref 0.0–0.2)

## 2020-11-03 LAB — BASIC METABOLIC PANEL
Anion gap: 8 (ref 5–15)
BUN: 28 mg/dL — ABNORMAL HIGH (ref 8–23)
CO2: 27 mmol/L (ref 22–32)
Calcium: 9.8 mg/dL (ref 8.9–10.3)
Chloride: 101 mmol/L (ref 98–111)
Creatinine, Ser: 1.22 mg/dL — ABNORMAL HIGH (ref 0.44–1.00)
GFR, Estimated: 49 mL/min — ABNORMAL LOW (ref >60)
Glucose, Bld: 160 mg/dL — ABNORMAL HIGH (ref 70–99)
Potassium: 3.3 mmol/L — ABNORMAL LOW (ref 3.5–5.1)
Sodium: 136 mmol/L (ref 135–145)

## 2020-11-03 LAB — GLUCOSE, CAPILLARY
Glucose-Capillary: 138 mg/dL — ABNORMAL HIGH (ref 70–99)
Glucose-Capillary: 131 mg/dL — ABNORMAL HIGH (ref 70–99)
Glucose-Capillary: 126 mg/dL — ABNORMAL HIGH (ref 70–99)
Glucose-Capillary: 150 mg/dL — ABNORMAL HIGH (ref 70–99)

## 2020-11-03 NOTE — Progress Notes (Signed)
PROGRESS NOTE    Cheryl Blankenship  KVQ:[id number] DOB: 01-10-1951 DOA: 10/30/2020 PCP: [name], PA-C   Brief Narrative:  70 year old female with history of breast cancer, symptomatic Covid infection in early January, type 2 diabetes mellitus with neuropathy, hypertension, diastolic CHF who has been admitted from cancer center with intractable chest pain for the past week.  As per the report She was seen in the ED on 10/26/2020 for chest wall pain which was present for a week at that time and work-up was negative for cardiac ischemia. She was given a prescription for hydrocodone but did not yet fill the prescription and her pain had since worsened. She continued to have pain across her upper chest rated 10/10 described as burning in nature worse with movement or palpation and presented to the ED this a.m. for the symptoms with associated nausea. CTA chest was negative but did have slightly elevated troponin. She was given Dilaudid IM with improvement in her symptoms and was discharged in stable condition with plans to follow-up at heralready scheduled oncology appointment at 8 AM this morning. Her oncologist discussed the pain with her cardiologist and this was not deemed to be cardiogenic in nature. It was thought her pain was more related to severe acid reflux or musculoskeletal in nature but due to her intractable pain and difficulty controlling it at homeand was given Phenergan in the office. Her oncologist asked to have her admitted to the hospital for further evaluation. Dr. Burr Medico had already consulted Dr. Alessandra Bevels, [facility], prior to arrival who will see in consult. Patient was seen by GI underwent endoscopy unremarkable, patient continues to complain of pain and being managed with pain medicine-was having constipation and tried on laxatives.  Her Cymbalta dose has been increased to help with the pain.  Subjective: Alert awake oriented some abdominal discomfort this  morning, asking for pain meds. Does not feel strong enough to go home today.   Assessment/Plan  Intractable nausea/vomiting/abdominal pain: Symptoms since starting of her chemo, patient has had extensive work-up  trops normal on admission,EKG nonischemic.  She had negative CTA chest eval. Dr. Burr Medico had discussed with her cardiologist Dr. Johann Capers does not feel this is cardiac.Seen by GI underwent EGD-EGD:Small hiatal hernia,Gastritis.Normal duodenal bulb, first portion of theduodenum and second portion of the duodenum,suspect symptoms are most likely chemotherapy-related (negative labs, CT, endoscopy; symptoms all started after chemotherapy). Suspecting chemotherapy related symptoms plus due to constipation.  Minimize opiate use, continue aggressive bowel regimen had good bowel movement, still having abdominal pain today trying further regimen if does well with diet and bowel movement will discharge later today or tomorrow.  I discussed with Dr. Onnie Graham regarding plan.  Patient Cymbalta has been increased 60 mg as well.  AKI creatinine peaked 1.4, now improving 1.2 continue hydration oral intake.  Demadex ARB on hold and can resume on discharge Recent Labs  Lab 10/28/20 1427 10/30/20 1513 10/31/20 0400 11/02/20 0517 11/03/20 0437  BUN 11 17 19  32* 28*  CREATININE 1.03* 0.86 0.94 1.49* 1.22*   Hypokalemia  2/2 # 1.   Repleted  Altered mental status mild medication induced delirium mental status is normal.  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 Burr Medico was on neoadjuvant chemo and she is to [facility] on 12/21-poorly tolerated due to persistent nausea vomiting constipation abdominal pain chest pain insomnia-and discontinued and there is plan to start antiestrogen therapy and cont  Herceptin q3 wk while waiting on consultation for breast surgery.  Outpatient follow-up with oncology.  Mild hypercalcemia likely from  malignancy vs dehydration- resolved.  General body pain diarrhea nausea vomiting constipation:  Now w/ constipation continue laxatives.  Essential hypertension/HLD: BP stable continue amlodipine/carvedilol and statin.  Holding diuretics losartan for now   Chronic diastolic CHF not in exacerbation, holding Demadex and losartan due to AKI .increase oral hydration.  Stop IV fluids   Mild transaminitis.  Monitor  Type 2 diabetes mellitus without complication: Blood sugar is stable on sliding scale insulin and Lantus .Has poorly controlled hemoglobin A1c 10.3 Recent Labs  Lab 11/02/20 0751 11/02/20 1142 11/02/20 1620 11/02/20 2052 11/03/20 0734  GLUCAP 114* 111* 101* 161* 138*   Covid positive in early January 2022, asymptomatic did not need treatment and tested negative later in January 2022  Morbid obesity BMI 36.  Patient PCP follow-up for weight loss strategy  Nutrition: Diet Order            DIET SOFT Room service appropriate? Yes; Fluid consistency: Thin  Diet effective now               Pt's Body mass index is 36.98 kg/m. DVT prophylaxis: enoxaparin (LOVENOX) injection 40 mg Start: 10/30/20 1800 Code Status:   Code Status: Full Code  Family Communication: plan of care discussed with patient at bedside.  Status is: Inpatient Remains inpatient appropriate because:Inpatient level of care appropriate due to severity of illness  Dispo: The patient is from: Home              Anticipated d/c is to: Home              Anticipated d/c date is: 1 day once having BM and abdomen pain better.              Patient currently is not medically stable to d/c.   Difficult to place patient No  Consultants:see note  Procedures:see note  Unresulted Labs (From admission, onward)         None       Culture/Microbiology No results found for: SDES, SPECREQUEST, CULT, REPTSTATUS  Other culture-see note  Medications: Scheduled Meds: . amLODipine  10 mg Oral Daily  . aspirin   81 mg Oral Daily  . carvedilol  25 mg Oral BID WC  . Chlorhexidine Gluconate Cloth  6 each Topical Daily  . DULoxetine  60 mg Oral Daily  . enoxaparin (LOVENOX) injection  40 mg Subcutaneous Q24H  . insulin aspart  0-15 Units Subcutaneous TID WC  . insulin aspart  0-5 Units Subcutaneous QHS  . insulin glargine  25 Units Subcutaneous QHS  . ondansetron (ZOFRAN) IV  4 mg Intravenous Q6H  . pantoprazole (PROTONIX) IV  40 mg Intravenous Daily  . polyethylene glycol  17 g Oral Daily  . rosuvastatin  5 mg Oral QHS  . senna-docusate  2 tablet Oral BID  . sucralfate  1 g Oral TID WC & HS   Continuous Infusions:  Antimicrobials: Anti-infectives (From admission, onward)   None     Objective: Vitals: Today's Vitals   11/03/20 0508 11/03/20 0830 11/03/20 1054 11/03/20 1509  BP: 108/74     Pulse: 74     Resp: 16     Temp: 97.7 F (36.5 C)     TempSrc: Oral     SpO2: 93%     Weight:      Height:      PainSc:  Asleep  Asleep Asleep    Intake/Output Summary (Last 24 hours) at 11/03/2020 1610 Last data filed at 11/03/2020 0302 Gross per 24 hour  Intake 2707.31 ml  Output 300 ml  Net 2407.31 ml   Filed Weights   10/30/20 1443 11/01/20 1022  Weight: 94.7 kg 94.7 kg   Weight change:   Intake/Output from previous day: 02/14 0701 - 02/15 0700 In: 3147.3 [P.O.:680; I.V.:2467.3] Out: 850 [Urine:850] Intake/Output this shift: No intake/output data recorded. Filed Weights   10/30/20 1443 11/01/20 1022  Weight: 94.7 kg 94.7 kg    Examination: General exam: AAOx3, obese, weak appearing. HEENT:Oral mucosa moist, Ear/Nose WNL grossly,dentition normal. Respiratory system: bilaterally clear,no wheezing or crackles,no use of accessory muscle, non tender. Cardiovascular system: S1 & S2 +, regular, No JVD. Gastrointestinal system: Abdomen soft,Obese, mild diffuse tenderness,ND, BS+. Nervous System:Alert, awake, moving extremities and grossly nonfocal Extremities: No edema, distal  peripheral pulses palpable.  Skin: No rashes,no icterus. MSK: Normal muscle bulk,tone, power  Data Reviewed: I have personally reviewed following labs and imaging studies CBC: Recent Labs  Lab 10/28/20 1427 10/30/20 1513 10/31/20 0400 11/02/20 0517 11/03/20 0437  WBC 6.5 6.7 7.1 6.5 7.8  NEUTROABS 3.2  --   --   --   --   HGB 12.3 13.1 13.1 12.4 12.0  HCT 37.6 39.8 39.9 38.2 36.6  MCV 84.3 84.5 84.0 85.1 85.7  PLT 117* 155 169 192 546   Basic Metabolic Panel: Recent Labs  Lab 10/28/20 1427 10/30/20 1513 10/30/20 1656 10/31/20 0400 11/02/20 0517 11/03/20 0437  NA 135 137  --  134* 135 136  K 3.7 3.0*  --  3.5 3.2* 3.3*  CL 97* 99  --  95* 96* 101  CO2 30 28  --  28 29 27   GLUCOSE 297* 191*  --  171* 109* 160*  BUN 11 17  --  19 32* 28*  CREATININE 1.03* 0.86  --  0.94 1.49* 1.22*  CALCIUM 10.7* 10.5*  --  10.5* 10.1 9.8  MG  --   --  1.8  --   --   --    GFR: Estimated Creatinine Clearance: 49 mL/min (A) (by C-G formula based on SCr of 1.22 mg/dL (H)). Liver Function Tests: Recent Labs  Lab 10/28/20 1427 10/30/20 1513 10/31/20 0400 11/02/20 0517  AST 33 50* 61* 37  ALT 27 49* 60* 42  ALKPHOS 71 60 60 51  BILITOT 0.6 0.9 0.8 0.7  PROT 7.9 7.5 7.6 6.9  ALBUMIN 3.7 3.7 3.8 3.5   Recent Labs  Lab 11/02/20 0517  LIPASE 22   No results for input(s): AMMONIA in the last 168 hours. Coagulation Profile: No results for input(s): INR, PROTIME in the last 168 hours. Cardiac Enzymes: No results for input(s): CKTOTAL, CKMB, CKMBINDEX, TROPONINI in the last 168 hours. BNP (last 3 results) No results for input(s): PROBNP in the last 8760 hours. HbA1C: No results for input(s): HGBA1C in the last 72 hours. CBG: Recent Labs  Lab 11/02/20 1142 11/02/20 1620 11/02/20 2052 11/03/20 0734 11/03/20 1143  GLUCAP 111* 101* 161* 138* 131*   Lipid Profile: No results for input(s): CHOL, HDL, LDLCALC, TRIG, CHOLHDL, LDLDIRECT in the last 72 hours. Thyroid Function  Tests: No results for input(s): TSH, T4TOTAL, FREET4, T3FREE, THYROIDAB in the last 72 hours. Anemia Panel: No results for input(s): VITAMINB12, FOLATE, FERRITIN, TIBC, IRON, RETICCTPCT in the last 72 hours. Sepsis Labs: No results for input(s): PROCALCITON, LATICACIDVEN in the last 168 hours.  Recent  Results (from the past 240 hour(s))  SARS CORONAVIRUS 2 (TAT 6-24 HRS) Nasopharyngeal Nasopharyngeal Swab     Status: None   Collection Time: 10/31/20  2:15 PM   Specimen: Nasopharyngeal Swab  Result Value Ref Range Status   SARS Coronavirus 2 NEGATIVE NEGATIVE Final    Comment: (NOTE) SARS-CoV-2 target nucleic acids are NOT DETECTED.  The SARS-CoV-2 RNA is generally detectable in upper and lower respiratory specimens during the acute phase of infection. Negative results do not preclude SARS-CoV-2 infection, do not rule out co-infections with other pathogens, and should not be used as the sole basis for treatment or other patient management decisions. Negative results must be combined with clinical observations, patient history, and epidemiological information. The expected result is Negative.  Fact Sheet for Patients: SugarRoll.be  Fact Sheet for Healthcare Providers: https://www.woods-mathews.com/  This test is not yet approved or cleared by the [place] FDA and  has been authorized for detection and/or diagnosis of SARS-CoV-2 by FDA under an Emergency Use Authorization (EUA). This EUA will remain  in effect (meaning this test can be used) for the duration of the COVID-19 declaration under Se ction 564(b)(1) of the Act, 21 U.S.C. section 360bbb-3(b)(1), unless the authorization is terminated or revoked sooner.  Performed at [facility], [place] [address] [place], [place] [address]      Radiology Studies: No results found.   LOS: 3 days   Antonieta Pert, MD [facility]  11/03/2020, 4:10 PM

## 2020-11-03 NOTE — Progress Notes (Signed)
Cheryl Blankenship   DOB:Jan 22, 1951   LN#:[id number]   DEY#:[id number]  Oncology follow up   Subjective: Patient had a 3 bowel movement yesterday, with good amount.  She states her abdominal pain has improved, but still moderate overall.  She has not had a bowel movement today.  She is on clear liquid diet, feels hungry, wants to eat.  Denies any nausea vomiting, her chest pain has resolved.  No other concerns.  Objective:  Vitals:   11/02/20 2051 11/03/20 0508  BP: 97/62 108/74  Pulse: 63 74  Resp: 16 16  Temp: 97.8 F (36.6 C) 97.7 F (36.5 C)  SpO2: 98% 93%    Body mass index is 36.98 kg/m.  Intake/Output Summary (Last 24 hours) at 11/03/2020 1311 Last data filed at 11/03/2020 0302 Gross per 24 hour  Intake 2947.31 ml  Output 850 ml  Net 2097.31 ml     Sclerae unicteric  Oropharynx clear  No peripheral adenopathy  Lungs clear -- no rales or rhonchi  Heart regular rate and rhythm  Abdomen slightly distended, diffuse moderate tenderness no rebound pain  MSK no focal spinal tenderness, no peripheral edema  Neuro nonfocal    CBG (last 3)  Recent Labs    11/02/20 2052 11/03/20 0734 11/03/20 1143  GLUCAP 161* 138* 131*     Labs:   Urine Studies No results for input(s): UHGB, CRYS in the last 72 hours.  Invalid input(s): UACOL, UAPR, USPG, UPH, UTP, UGL, [place], UBIL, UNIT, UROB, [place], UEPI, UWBC, [employer], [place], [place]  Basic Metabolic Panel: Recent Labs  Lab 10/28/20 1427 10/30/20 1513 10/30/20 1656 10/31/20 0400 11/02/20 0517 11/03/20 0437  NA 135 137  --  134* 135 136  K 3.7 3.0*  --  3.5 3.2* 3.3*  CL 97* 99  --  95* 96* 101  CO2 30 28  --  28 29 27   GLUCOSE 297* 191*  --  171* 109* 160*  BUN 11 17  --  19 32* 28*  CREATININE 1.03* 0.86  --  0.94 1.49* 1.22*  CALCIUM 10.7* 10.5*  --  10.5* 10.1 9.8  MG  --   --  1.8  --   --   --    GFR Estimated Creatinine Clearance: 49 mL/min (A) (by C-G formula based on SCr of 1.22 mg/dL (H)). Liver  Function Tests: Recent Labs  Lab 10/28/20 1427 10/30/20 1513 10/31/20 0400 11/02/20 0517  AST 33 50* 61* 37  ALT 27 49* 60* 42  ALKPHOS 71 60 60 51  BILITOT 0.6 0.9 0.8 0.7  PROT 7.9 7.5 7.6 6.9  ALBUMIN 3.7 3.7 3.8 3.5   Recent Labs  Lab 11/02/20 0517  LIPASE 22   No results for input(s): AMMONIA in the last 168 hours. Coagulation profile No results for input(s): INR, PROTIME in the last 168 hours.  CBC: Recent Labs  Lab 10/28/20 1427 10/30/20 1513 10/31/20 0400 11/02/20 0517 11/03/20 0437  WBC 6.5 6.7 7.1 6.5 7.8  NEUTROABS 3.2  --   --   --   --   HGB 12.3 13.1 13.1 12.4 12.0  HCT 37.6 39.8 39.9 38.2 36.6  MCV 84.3 84.5 84.0 85.1 85.7  PLT 117* 155 169 192 204   Cardiac Enzymes: No results for input(s): CKTOTAL, CKMB, CKMBINDEX, TROPONINI in the last 168 hours. BNP: Invalid input(s): POCBNP CBG: Recent Labs  Lab 11/02/20 1142 11/02/20 1620 11/02/20 2052 11/03/20 0734 11/03/20 1143  GLUCAP 111* 101* 161* 138* 131*  D-Dimer No results for input(s): DDIMER in the last 72 hours. Hgb A1c No results for input(s): HGBA1C in the last 72 hours. Lipid Profile No results for input(s): CHOL, HDL, LDLCALC, TRIG, CHOLHDL, LDLDIRECT in the last 72 hours. Thyroid function studies No results for input(s): TSH, T4TOTAL, T3FREE, THYROIDAB in the last 72 hours.  Invalid input(s): FREET3 Anemia work up No results for input(s): VITAMINB12, FOLATE, FERRITIN, TIBC, IRON, RETICCTPCT in the last 72 hours. Microbiology Recent Results (from the past 240 hour(s))  SARS CORONAVIRUS 2 (TAT 6-24 HRS) Nasopharyngeal Nasopharyngeal Swab     Status: None   Collection Time: 10/31/20  2:15 PM   Specimen: Nasopharyngeal Swab  Result Value Ref Range Status   SARS Coronavirus 2 NEGATIVE NEGATIVE Final    Comment: (NOTE) SARS-CoV-2 target nucleic acids are NOT DETECTED.  The SARS-CoV-2 RNA is generally detectable in upper and lower respiratory specimens during the acute phase of  infection. Negative results do not preclude SARS-CoV-2 infection, do not rule out co-infections with other pathogens, and should not be used as the sole basis for treatment or other patient management decisions. Negative results must be combined with clinical observations, patient history, and epidemiological information. The expected result is Negative.  Fact Sheet for Patients: SugarRoll.be  Fact Sheet for Healthcare Providers: https://www.woods-mathews.com/  This test is not yet approved or cleared by the [place] FDA and  has been authorized for detection and/or diagnosis of SARS-CoV-2 by FDA under an Emergency Use Authorization (EUA). This EUA will remain  in effect (meaning this test can be used) for the duration of the COVID-19 declaration under Se ction 564(b)(1) of the Act, 21 U.S.C. section 360bbb-3(b)(1), unless the authorization is terminated or revoked sooner.  Performed at [facility], [place] [address] [place], [place] [address]       Studies:  No results found.  Assessment: 70 y.o.  Female on neoadjuvant chemotherapy for breast cancer, admitted for intractable nausea vomiting and chest pain  1.  Intractable nausea and vomiting, secondary to chemotherapy, resolved  2.  Severe chest pain, unclear etiology, resolved  3.  Diffuse abdominal pain, likely secondary to constipation, improved  4.  Stage II left breast cancer, on neoadjuvant chemo, with poor tolerance  5.  Poorly controlled diabetes 6. CHF, hypertension, stable   Plan:  -I encouraged her to use a few more doses of lactulose today, to have a few more bowel movement, to see if her abdominal pain improves.  Her abdomen is still quite distended on exam today, but is softer than yesterday. -Advance her diet -If she is doing well after bowel movement, okay to discharge home later today or tomorrow morning. -I spoke with her nurse and Dr. Lupita Leash today -I will  set up her f/u with me next week   [name], MD 11/03/2020  1:11 PM

## 2020-11-03 NOTE — Care Management Important Message (Signed)
Important Message  Patient Details IM Letter given to the Patient. Name: Cheryl Blankenship MRN: [id number] Date of Birth: 12/03/1950   Medicare Important Message Given:  Yes     Kerin Salen 11/03/2020, 2:02 PM

## 2020-11-04 DIAGNOSIS — R079 Chest pain, unspecified: Secondary | ICD-10-CM | POA: Diagnosis not present

## 2020-11-04 DIAGNOSIS — K5900 Constipation, unspecified: Secondary | ICD-10-CM | POA: Diagnosis not present

## 2020-11-04 LAB — GLUCOSE, CAPILLARY: Glucose-Capillary: 109 mg/dL — ABNORMAL HIGH (ref 70–99)

## 2020-11-04 MED ORDER — LACTULOSE 10 GM/15ML PO SOLN: 30.0000 g | ORAL | 0 refills | Status: DC | PRN

## 2020-11-04 MED ORDER — BISACODYL 5 MG PO TBEC: 5.0000 mg | DELAYED_RELEASE_TABLET | Freq: Every day | ORAL | 0 refills | Status: AC | PRN

## 2020-11-04 MED ORDER — HEPARIN SOD (PORK) LOCK FLUSH 100 UNIT/ML IV SOLN
500.0000 [IU] | Freq: Once | INTRAVENOUS | Status: AC
  Administered 2020-11-04: 500 [IU] via INTRAVENOUS
  Filled 2020-11-04: qty 5

## 2020-11-04 MED ORDER — DULOXETINE HCL 30 MG PO CPEP: 60.0000 mg | ORAL_CAPSULE | Freq: Every day | ORAL | 0 refills | Status: DC

## 2020-11-04 MED ORDER — SUCRALFATE 1 GM/10ML PO SUSP: 1.0000 g | Freq: Three times a day (TID) | ORAL | 0 refills | Status: DC

## 2020-11-04 MED ORDER — SENNOSIDES-DOCUSATE SODIUM 8.6-50 MG PO TABS: 2.0000 | ORAL_TABLET | Freq: Two times a day (BID) | ORAL | 0 refills | Status: AC

## 2020-11-04 NOTE — Discharge Summary (Signed)
Physician Discharge Summary  Cheryl Blankenship BMW:[id number] DOB: Aug 07, 1951 DOA: 10/30/2020  PCP: [name], PA-C  Admit date: 10/30/2020 Discharge date: 11/04/2020  Admitted From: home Disposition:  home  Recommendations for Outpatient Follow-up:  1. Follow up with PCP in 1-2 weeks 2. Please obtain BMP/CBC in one week 3. Please follow up on the following pending results:  Home Health:no  Equipment/Devices: none  Discharge Condition: Stable Code Status:   Code Status: Full Code Diet recommendation:  Diet Order            Diet - low sodium heart healthy           DIET SOFT Room service appropriate? Yes; Fluid consistency: Thin  Diet effective now                  Brief/Interim Summary: 70 year old female with history of breast cancer, symptomatic Covid infection in early January, type 2 diabetes mellitus with neuropathy, hypertension, diastolic CHF who has been admitted from cancer center with intractable chest pain for the past week. As per the report She was seen in the ED on 10/26/2020 for chest wall pain which was present for a week at that time and work-up was negative for cardiac ischemia. She was given a prescription for hydrocodone but did not yet fill the prescription and her pain had since worsened. She continued to have pain across her upper chest rated 10/10 described as burning in nature worse with movement or palpation and presented to the ED this a.m. for the symptoms with associated nausea. CTA chest was negative but did have slightly elevated troponin. She was given Dilaudid IM with improvement in her symptoms and was discharged in stable condition with plans to follow-up at heralready scheduled oncology appointment at 8 AM this morning. Her oncologist discussed the pain with her cardiologist and this was not deemed to be cardiogenic in nature. It was thought her pain was more related to severe acid reflux or musculoskeletal in nature but due to her  intractable pain and difficulty controlling it at homeand was given Phenergan in the office. Her oncologist asked to have her admitted to the hospital for further evaluation. Dr. Burr Medico had already consulted Dr. Alessandra Bevels, [facility], prior to arrival who will see in consult. Patient was seen by GI underwent endoscopy unremarkable, patient continues to complain of pain and being managed with pain medicine-was having constipation and tried on laxatives.  Her Cymbalta dose has been increased to help with the pain. Patient has treated aggressively with laxatives at this time had multiple bowel Manson-Aebli pain is much improved. Discussed about me Imaging use of narcotics may use her home hydrocodone and can follow-up with Dr. Burr Medico if she needs further pain medication and pain control adjustment, I discussed with oncologist and is agreeable w/ plan,  okay for discharge home today.. Patient feels stable for discharge this morning. She is requesting to go home with lactulose.  Discharge Diagnoses:  Intractable nausea/vomiting/abdominal pain: Symptoms since starting of her chemo, patient has had extensive work-up  trops normal on admission,EKG nonischemic. She had negative CTA chesteval. Dr. Burr Medico had discussed with her cardiologist Dr. Johann Capers does not feel this is cardiac.Seen by GI underwent EGD-EGD:Small hiatal hernia,Gastritis.Normal duodenal bulb, first portion of theduodenum and second portion of the duodenum,suspect symptoms are most likely chemotherapy-related (negative labs, CT, endoscopy; symptoms all started after chemotherapy).Suspecting chemotherapy versus constipation related. Symptoms seems to be getting better due to laxatives. She will continue laxatives at home. Continue Cymbalta  at increased dose. Can continue home hydrocodone and advised to follow-up with her oncology for further plan.  AKI creatinine peaked 1.4, improved to 1.2. Resume her home meds outpatient BMP follow-up         Recent Labs  Lab 10/28/20 1427 10/30/20 1513 10/31/20 0400 11/02/20 0517 11/03/20 0437  BUN 11 17 19  32* 28*  CREATININE 1.03* 0.86 0.94 1.49* 1.22*   Hypokalemia2/2 # 1. Was repleted  Altered mental status mild medication induced delirium:mental status is normal.  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 Burr Medico was on neoadjuvant chemo and she is to [facility] on 12/21-poorly tolerated due to persistent nausea vomiting constipation abdominal pain chest pain insomnia-and discontinued and there is plan to start antiestrogen therapy and cont Herceptin q3 wk while waiting on consultation for breast surgery.  Outpatient follow-up with oncology.  Mild hypercalcemialikely from malignancy vs dehydration- resolved.  Essential hypertension/HLD: BP stable continue amlodipine/carvedilol and statin. Resume rest of home meds- diuretics losartan on d/c.  Chronic diastolic CHFnot in exacerbation, resume her Demadex and losartan on d/c.  Mild transaminitis. Monitor  Type 2 diabetes mellitus without complication:Blood sugar is controlled. Resume home insulin regimen and medication.HBA1c 10.3.  Covid positive in early January 2022,asymptomatic did not need treatment and tested negative later in January 2022 Morbid obesity BMI 36.Patient PCP follow-upfor weight loss strategy  Consults:  HEM-ONC  Subjective: Alert,awake, pain better. Wants t ogo home today Discharge Exam: Vitals:   11/03/20 2059 11/04/20 0534  BP: (!) 112/96 (!) 139/106  Pulse: 64 67  Resp: 18 18  Temp: 98 F (36.7 C) 98 F (36.7 C)  SpO2: 98% 95%   General: Pt is alert, awake, not in acute distress Cardiovascular: RRR, S1/S2 +, no rubs, no gallops Respiratory: CTA bilaterally, no wheezing, no rhonchi Abdominal: Soft, NT, ND, bowel sounds + Extremities: no edema, no cyanosis  Discharge Instructions  Discharge  Instructions    Diet - low sodium heart healthy   Complete by: As directed    Discharge instructions   Complete by: As directed    Please call call MD or return to ER for similar or worsening recurring problem that brought you to hospital or if any fever,nausea/vomiting,abdominal pain, uncontrolled pain, chest pain,  shortness of breath or any other alarming symptoms.  Please follow-up your doctor as instructed in a week time and call the office for appointment.  Please avoid alcohol, smoking, or any other illicit substance and maintain healthy habits including taking your regular medications as prescribed.  You were cared for by a hospitalist during your hospital stay. If you have any questions about your discharge medications or the care you received while you were in the hospital after you are discharged, you can call the unit and ask to speak with the hospitalist on call if the hospitalist that took care of you is not available.  Once you are discharged, your primary care physician will handle any further medical issues. Please note that NO REFILLS for any discharge medications will be authorized once you are discharged, as it is imperative that you return to your primary care physician (or establish a relationship with a primary care physician if you do not have one) for your aftercare needs so that they can reassess your need for medications and monitor your lab values   Increase activity slowly   Complete by: As directed      Allergies as of  11/04/2020      Reactions   Coconut (cocos Nucifera) Allergy Skin Test    Hives and swells mouth   Hydrochlorothiazide Swelling   tongue   Lisinopril Swelling   Angioedema. Tolerates Losartan.   Coconut Oil Hives      Medication List    STOP taking these medications   diphenoxylate-atropine 2.5-0.025 MG tablet Commonly known as: LOMOTIL     TAKE these medications   Accu-Chek FastClix Lancets Misc   albuterol 108 (90 Base) MCG/ACT  inhaler Commonly known as: VENTOLIN HFA Inhale 1-2 puffs into the lungs every 4 (four) hours as needed for wheezing or shortness of breath.   amLODipine 10 MG tablet Commonly known as: NORVASC Take 1 tablet (10 mg total) by mouth daily.   aspirin 81 MG chewable tablet Chew 81 mg by mouth daily.   bisacodyl 5 MG EC tablet Commonly known as: DULCOLAX Take 1 tablet (5 mg total) by mouth daily as needed for moderate constipation.   carvedilol 25 MG tablet Commonly known as: COREG Take 1 tablet (25 mg total) by mouth 2 (two) times daily with a meal.   Cholecalciferol 25 MCG (1000 UT) capsule Take 1,000 Units by mouth daily.   DULoxetine 30 MG capsule Commonly known as: CYMBALTA Take 2 capsules (60 mg total) by mouth daily. What changed: how much to take   empagliflozin 10 MG Tabs tablet Commonly known as: JARDIANCE Take 1 tablet (10 mg total) by mouth daily before breakfast.   feeding supplement (GLUCERNA 1.2 CAL) Liqd Take 237 mLs by mouth 3 (three) times daily with meals.   Fish Oil 1000 MG Caps Take 1,000 mg by mouth daily.   folic acid 1 MG tablet Commonly known as: FOLVITE Take 1 mg by mouth daily.   gabapentin 400 MG capsule Commonly known as: NEURONTIN Take 400 mg by mouth 3 (three) times daily.   glipiZIDE 10 MG 24 hr tablet Commonly known as: GLUCOTROL XL Take 10 mg by mouth daily with breakfast.   HYDROcodone-acetaminophen 5-325 MG tablet Commonly known as: NORCO/VICODIN Take 1 tablet by mouth every 6 (six) hours as needed for up to 15 doses for moderate pain.   insulin aspart 100 UNIT/ML injection Commonly known as: novoLOG Inject 2-10 Units into the skin 3 (three) times daily before meals. Per sliding scale:  150-199 = 2 units, 200-249 = 4 units, 250-299 = 6 units, 300-349 = 8 units, greater than350 = 10 units.   lactulose 10 GM/15ML solution Commonly known as: CHRONULAC Take 45 mLs (30 g total) by mouth every 4 (four) hours as needed for mild  constipation.   Lantus SoloStar 100 UNIT/ML Solostar Pen Generic drug: insulin glargine Inject 35 Units into the skin 2 (two) times daily. What changed: additional instructions   lidocaine-prilocaine cream Commonly known as: EMLA APPLY TO AFFECTED AREA 1 time DAILY What changed:   how much to take  how to take this  when to take this  reasons to take this  additional instructions   losartan 100 MG tablet Commonly known as: COZAAR Take 1 tablet (100 mg total) by mouth daily.   metFORMIN 500 MG tablet Commonly known as: GLUCOPHAGE Take 500 mg by mouth 2 (two) times daily with a meal.   omeprazole 20 MG capsule Commonly known as: PRILOSEC Take 1 capsule (20 mg total) by mouth daily.   ondansetron 4 MG disintegrating tablet Commonly known as: ZOFRAN-ODT Take 1 tablet (4 mg total) by mouth 2 (two) times daily as  needed for up to 30 doses for nausea or vomiting.   prochlorperazine 10 MG tablet Commonly known as: COMPAZINE Take 1 tablet (10 mg total) by mouth every 6 (six) hours as needed for nausea or vomiting.   promethazine 25 MG suppository Commonly known as: PHENERGAN Place 1 suppository (25 mg total) rectally every 8 (eight) hours as needed for nausea or vomiting.   Restasis 0.05 % ophthalmic emulsion Generic drug: cycloSPORINE Place 1 drop into both eyes 2 (two) times daily.   rosuvastatin 5 MG tablet Commonly known as: CRESTOR Take 5 mg by mouth at bedtime.   senna-docusate 8.6-50 MG tablet Commonly known as: Senokot-S Take 2 tablets by mouth 2 (two) times daily.   spironolactone 25 MG tablet Commonly known as: Aldactone Take 1 tablet (25 mg total) by mouth daily.   sucralfate 1 GM/10ML suspension Commonly known as: CARAFATE Take 10 mLs (1 g total) by mouth 4 (four) times daily -  with meals and at bedtime.   torsemide 20 MG tablet Commonly known as: DEMADEX Take 2 tablets (40 mg total) by mouth daily.   zolpidem 5 MG tablet Commonly known as:  AMBIEN Take 1 tablet (5 mg total) by mouth at bedtime as needed for sleep.       Follow-up Information    [name], PA-C Follow up.   Specialty: Internal Medicine Contact information: [address] [place] [address] [phone]        [name], MD. Call.   Specialties: Hematology, Oncology Contact information: [address] [place] [address] [phone]              Allergies  Allergen Reactions  . Coconut (Cocos Nucifera) Allergy Skin Test     Hives and swells mouth  . Hydrochlorothiazide Swelling    tongue  . Lisinopril Swelling    Angioedema. Tolerates Losartan.  . Coconut Oil Hives    The results of significant diagnostics from this hospitalization (including imaging, microbiology, ancillary and laboratory) are listed below for reference.    Microbiology: Recent Results (from the past 240 hour(s))  SARS CORONAVIRUS 2 (TAT 6-24 HRS) Nasopharyngeal Nasopharyngeal Swab     Status: None   Collection Time: 10/31/20  2:15 PM   Specimen: Nasopharyngeal Swab  Result Value Ref Range Status   SARS Coronavirus 2 NEGATIVE NEGATIVE Final    Comment: (NOTE) SARS-CoV-2 target nucleic acids are NOT DETECTED.  The SARS-CoV-2 RNA is generally detectable in upper and lower respiratory specimens during the acute phase of infection. Negative results do not preclude SARS-CoV-2 infection, do not rule out co-infections with other pathogens, and should not be used as the sole basis for treatment or other patient management decisions. Negative results must be combined with clinical observations, patient history, and epidemiological information. The expected result is Negative.  Fact Sheet for Patients: SugarRoll.be  Fact Sheet for Healthcare Providers: https://www.woods-mathews.com/  This test is not yet approved or cleared by the [place] FDA and  has been authorized for  detection and/or diagnosis of SARS-CoV-2 by FDA under an Emergency Use Authorization (EUA). This EUA will remain  in effect (meaning this test can be used) for the duration of the COVID-19 declaration under Se ction 564(b)(1) of the Act, 21 U.S.C. section 360bbb-3(b)(1), unless the authorization is terminated or revoked sooner.  Performed at [facility], [place] [address] [place], [place] [address]     Procedures/Studies: CT ABDOMEN PELVIS WO CONTRAST  Result Date: 10/31/2020 CLINICAL  DATA:  Abdominal distension. Recent diagnosis of breast cancer. EXAM: CT ABDOMEN AND PELVIS WITHOUT CONTRAST TECHNIQUE: Multidetector CT imaging of the abdomen and pelvis was performed following the standard protocol without IV contrast. COMPARISON:  08/07/2020 FINDINGS: Lower chest: Linear atelectasis in the left lower lobe. Hepatobiliary: Liver appears normal without contrast. Previous cholecystectomy. Pancreas: Normal Spleen: Normal Adrenals/Urinary Tract: Adrenal glands are normal. Kidneys appear normal without contrast. No sign of obstruction. Bladder is normal. Stomach/Bowel: Stomach is normal. Small bowel pattern is normal. No small bowel obstruction. Large amount of stool and gas in the right colon and gas in the transverse colon. Left colon is normal. Rectum is normal. Vascular/Lymphatic: Aortic atherosclerosis. No aneurysm. IVC is normal. No retroperitoneal adenopathy. Reproductive: No pelvic mass. Small amount of fluid in the endometrial canal as seen previously. Other: No free fluid or air. Musculoskeletal: Umbilical hernia containing only fat. Chronic degenerative changes of the mid to lower lumbar spine. IMPRESSION: 1. No acute finding by CT other than a large amount of stool and gas in the right colon and gas in the transverse colon. No small bowel obstruction. 2. Previous cholecystectomy. 3. Aortic atherosclerosis. 4. Umbilical hernia containing only fat. 5. Small amount of fluid in the endometrial  canal as seen previously. Aortic Atherosclerosis (ICD10-I70.0). Electronically Signed   By: Nelson Chimes M.D.   On: 10/31/2020 01:33   DG Chest 2 View  Result Date: 10/26/2020 CLINICAL DATA:  Chest pain EXAM: CHEST - 2 VIEW COMPARISON:  08/25/2020 FINDINGS: Cardiac shadow is stable. Aortic calcifications are again seen. Right-sided chest wall port is noted in satisfactory position. Lungs are clear. No bony abnormality is noted. IMPRESSION: No active cardiopulmonary disease. Electronically Signed   By: Inez Catalina M.D.   On: 10/26/2020 15:13   DG Abd 1 View  Result Date: 10/30/2020 CLINICAL DATA:  Abdominal pain and distension. EXAM: ABDOMEN - 1 VIEW COMPARISON:  None. FINDINGS: The visualized lung bases are grossly clear.  No pleural effusions. Moderate air throughout the small bowel and colon which may suggest an ileus. No free air. The bony structures are intact. IMPRESSION: Possible ileus. Electronically Signed   By: Marijo Sanes M.D.   On: 10/30/2020 16:28   CT Angio Chest PE W and/or Wo Contrast  Result Date: 10/27/2020 CLINICAL DATA:  Chest pain and positive D-dimer EXAM: CT ANGIOGRAPHY CHEST WITH CONTRAST TECHNIQUE: Multidetector CT imaging of the chest was performed using the standard protocol during bolus administration of intravenous contrast. Multiplanar CT image reconstructions and MIPs were obtained to evaluate the vascular anatomy. CONTRAST:  98mL OMNIPAQUE IOHEXOL 350 MG/ML SOLN COMPARISON:  None. FINDINGS: Cardiovascular: There is slightly suboptimal opacification of the main pulmonary artery, however no central or proximal segmental pulmonary embolism is seen. The heart is normal in size. No pericardial effusion or thickening. No evidence right heart strain. There is normal three-vessel brachiocephalic anatomy without proximal stenosis. Scattered mild aortic atherosclerosis is noted. Mediastinum/Nodes: No hilar, mediastinal, or axillary adenopathy. Thyroid gland, trachea, and esophagus  demonstrate no significant findings. Lungs/Pleura: Minimal ground-glass opacity seen at both lung bases. No large airspace consolidation or pleural effusion. Upper Abdomen: No acute abnormalities present in the visualized portions of the upper abdomen. Musculoskeletal: No chest wall abnormality. No acute or significant osseous findings. Review of the MIP images confirms the above findings. IMPRESSION: Slightly suboptimal opacification of the main pulmonary artery, however no central or proximal segmental pulmonary embolism Minimal bibasilar atelectasis Aortic Atherosclerosis (ICD10-I70.0). Electronically Signed   By: Ebony Cargo.D.  On: 10/27/2020 00:27    Labs: BNP (last 3 results) Recent Labs    08/25/20 2326  BNP 97.3   Basic Metabolic Panel: Recent Labs  Lab 10/28/20 1427 10/30/20 1513 10/30/20 1656 10/31/20 0400 11/02/20 0517 11/03/20 0437  NA 135 137  --  134* 135 136  K 3.7 3.0*  --  3.5 3.2* 3.3*  CL 97* 99  --  95* 96* 101  CO2 30 28  --  28 29 27   GLUCOSE 297* 191*  --  171* 109* 160*  BUN 11 17  --  19 32* 28*  CREATININE 1.03* 0.86  --  0.94 1.49* 1.22*  CALCIUM 10.7* 10.5*  --  10.5* 10.1 9.8  MG  --   --  1.8  --   --   --    Liver Function Tests: Recent Labs  Lab 10/28/20 1427 10/30/20 1513 10/31/20 0400 11/02/20 0517  AST 33 50* 61* 37  ALT 27 49* 60* 42  ALKPHOS 71 60 60 51  BILITOT 0.6 0.9 0.8 0.7  PROT 7.9 7.5 7.6 6.9  ALBUMIN 3.7 3.7 3.8 3.5   Recent Labs  Lab 11/02/20 0517  LIPASE 22   No results for input(s): AMMONIA in the last 168 hours. CBC: Recent Labs  Lab 10/28/20 1427 10/30/20 1513 10/31/20 0400 11/02/20 0517 11/03/20 0437  WBC 6.5 6.7 7.1 6.5 7.8  NEUTROABS 3.2  --   --   --   --   HGB 12.3 13.1 13.1 12.4 12.0  HCT 37.6 39.8 39.9 38.2 36.6  MCV 84.3 84.5 84.0 85.1 85.7  PLT 117* 155 169 192 204   Cardiac Enzymes: No results for input(s): CKTOTAL, CKMB, CKMBINDEX, TROPONINI in the last 168 hours. BNP: Invalid  input(s): POCBNP CBG: Recent Labs  Lab 11/03/20 0734 11/03/20 1143 11/03/20 1705 11/03/20 2106 11/04/20 0732  GLUCAP 138* 131* 126* 150* 109*   D-Dimer No results for input(s): DDIMER in the last 72 hours. Hgb A1c No results for input(s): HGBA1C in the last 72 hours. Lipid Profile No results for input(s): CHOL, HDL, LDLCALC, TRIG, CHOLHDL, LDLDIRECT in the last 72 hours. Thyroid function studies No results for input(s): TSH, T4TOTAL, T3FREE, THYROIDAB in the last 72 hours.  Invalid input(s): FREET3 Anemia work up No results for input(s): VITAMINB12, FOLATE, FERRITIN, TIBC, IRON, RETICCTPCT in the last 72 hours. Urinalysis No results found for: COLORURINE, APPEARANCEUR, [place], [place], [place], [place], [place], [place], PROTEINUR, UROBILINOGEN, NITRITE, LEUKOCYTESUR Sepsis Labs Invalid input(s): PROCALCITONIN,  WBC,  LACTICIDVEN Microbiology Recent Results (from the past 240 hour(s))  SARS CORONAVIRUS 2 (TAT 6-24 HRS) Nasopharyngeal Nasopharyngeal Swab     Status: None   Collection Time: 10/31/20  2:15 PM   Specimen: Nasopharyngeal Swab  Result Value Ref Range Status   SARS Coronavirus 2 NEGATIVE NEGATIVE Final    Comment: (NOTE) SARS-CoV-2 target nucleic acids are NOT DETECTED.  The SARS-CoV-2 RNA is generally detectable in upper and lower respiratory specimens during the acute phase of infection. Negative results do not preclude SARS-CoV-2 infection, do not rule out co-infections with other pathogens, and should not be used as the sole basis for treatment or other patient management decisions. Negative results must be combined with clinical observations, patient history, and epidemiological information. The expected result is Negative.  Fact Sheet for Patients: SugarRoll.be  Fact Sheet for Healthcare Providers: https://www.woods-mathews.com/  This test is not yet approved or cleared by the [place] FDA and  has  been authorized for detection and/or diagnosis of  SARS-CoV-2 by FDA under an Emergency Use Authorization (EUA). This EUA will remain  in effect (meaning this test can be used) for the duration of the COVID-19 declaration under Se ction 564(b)(1) of the Act, 21 U.S.C. section 360bbb-3(b)(1), unless the authorization is terminated or revoked sooner.  Performed at [facility], [place] [address], [place], [place] [address]      Time coordinating discharge: 25 minutes  SIGNED: Antonieta Pert, MD  [facility] 11/04/2020, 10:59 AM  If 7PM-7AM, please contact night-coverage [email or web address]

## 2020-11-04 NOTE — Progress Notes (Signed)
Pt discharged home with all belongings. Discharge education completed with pt. No questions or concerns at this time.

## 2020-11-04 NOTE — Progress Notes (Signed)
Patient with gastritis on 11/01/20 EGD.  H pylori IgG negative (collected 12/31/20).

## 2020-11-05 ENCOUNTER — Telehealth: Payer: Self-pay

## 2020-11-05 ENCOUNTER — Other Ambulatory Visit: Payer: Self-pay | Admitting: Hematology

## 2020-11-05 MED ORDER — MORPHINE SULFATE 15 MG PO TABS: 15.0000 mg | ORAL_TABLET | ORAL | 0 refills | Status: DC | PRN

## 2020-11-05 NOTE — Telephone Encounter (Signed)
Spoke with daughter clarified the pt is having bowel movements also clarified when rounding prior to discharge Dr. Burr Medico inquired and assessed for pain management and per pt nothing extra was needed. Also made daughter aware of morphine tab called into pharmacy daughter states mom has had a BM and is feeling much better

## 2020-11-06 ENCOUNTER — Telehealth (HOSPITAL_COMMUNITY): Payer: Self-pay | Admitting: Cardiovascular Disease

## 2020-11-06 NOTE — Progress Notes (Incomplete)
Cheryl Blankenship   Telephone:([phone] Fax:([phone]) [phone]   Clinic Follow up Note   Patient Care Team: [name], PA-C as PCP - General (Internal Medicine) [name], RN as Oncology Nurse Navigator [name], RN as Oncology Nurse Navigator [name], MD as Consulting Physician (General Surgery) [name], MD as Consulting Physician (Hematology) [name], MD as Consulting Physician (Radiation Oncology) [name], [name], MD as Consulting Physician (Cardiology)  Date of Service:  11/06/2020  CHIEF COMPLAINT: F/u of left breast cancer  SUMMARY OF ONCOLOGIC HISTORY: Oncology History Overview Note  Cancer Staging Malignant neoplasm of upper-outer quadrant of left breast in female, estrogen receptor positive ([place]) Staging form: Breast, AJCC 8th Edition - Clinical stage from 07/16/2020: Stage IB (cT2, cN1, cM0, G2, ER+, PR+, HER2+) - Signed by [name], MD on 07/21/2020    Malignant neoplasm of upper-outer quadrant of left breast in female, estrogen receptor positive ([place])  07/01/2020 Mammogram   IMPRESSION: 1. There is a 12 cm span of highly suspicious calcifications involving the upper outer and upper inner quadrants of the left breast. On ultrasound, within this area of calcifications, there are confluent masses at 1 o'clock spanning at least 4.5 cm. An additional suspicious mass is seen in the left breast at 2 o'clock.  -Ultrasound targeted to the left breast at 1 o'clock, 5 cm from the nipple demonstrates a broad area of hypoechoic irregular tissue/confluent masses spanning at least 4.5 cm. There is a separate oval hypoechoic mass with indistinct margins at 2 o'clock, 7 cm from the nipple measuring 0.9 x 0.4 x 0.9 cm. Ultrasound of the left axilla demonstrates 2 abnormally thickened lymph nodes with cortices measuring 6-7 mm. There is an additional lymph node with a borderline cortex of 4 mm.   2. There are 2 lymph nodes  with thickened cortices in the left axilla, and an additional lymph node with a borderline thickened cortex.   07/16/2020 Cancer Staging   Staging form: Breast, AJCC 8th Edition - Clinical stage from 07/16/2020: Stage IIA (cT3, cN1, cM0, G2, ER+, PR+, HER2+) - Signed by [name], MD on 08/05/2020   07/16/2020 Initial Biopsy   Diagnosis 1. 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 Breast, left, needle core biopsy, 1 o'clock - INVASIVE DUCTAL CARCINOMA - SEE COMMENT 3. Lymph node, biopsy - METASTATIC CARCINOMA INVOLVING A LYMPH NODE - SEE COMMENT Microscopic Comment 1. Based on the biopsy, the ductal carcinoma in situ has a comedo pattern, high nuclear grade and measures 0.7 cm in greatest linear extent. Prognostic markers (ER/PR) are pending and will be reported in an addendum. 2. Based on the biopsy, the carcinoma appears Nottingham grade 2 of 3 and measures 1.5 cm in greatest linear extent. Prognostic markers (ER/PR/ki-67/HER2) are pending and will be reported in an addendum. Dr. Saralyn Pilar reviewed the case and agrees with the above diagnosis. These results were called to [facility] on July 17, 2020. 3. Based on the biopsy, the carcinoma appears measures 1.0 cm in greatest linear extent. Prognostic markers (ER/PR/ki-67/HER2) are pending and will be reported in an addendum.   07/16/2020 Receptors her2   1. PROGNOSTIC INDICATORS Results: IMMUNOHISTOCHEMICAL AND MORPHOMETRIC ANALYSIS PERFORMED MANUALLY Estrogen Receptor: 30%, POSITIVE, STRONG STAINING INTENSITY Progesterone Receptor: 5%, POSITIVE, STRONG STAINING INTENSITY  2. PROGNOSTIC INDICATORS Results: IMMUNOHISTOCHEMICAL AND MORPHOMETRIC ANALYSIS PERFORMED MANUALLY The tumor cells are POSITIVE for  Her2 (3+). Estrogen Receptor: 85%, POSITIVE, STRONG STAINING INTENSITY Progesterone Receptor:  60%, POSITIVE, STRONG STAINING INTENSITY Proliferation Marker Ki67: 25%  3. PROGNOSTIC INDICATORS Results: IMMUNOHISTOCHEMICAL AND MORPHOMETRIC ANALYSIS PERFORMED MANUALLY The tumor cells are POSITIVE for Her2 (3+). Estrogen Receptor: 95%, POSITIVE, STRONG STAINING INTENSITY Progesterone Receptor: 45%, POSITIVE, STRONG STAINING INTENSITY Proliferation Marker Ki67: 30%   07/20/2020 Initial Diagnosis   Malignant neoplasm of upper-outer quadrant of left breast in female, estrogen receptor positive ([place])   07/31/2020 Echocardiogram   Baseline Echo  IMPRESSIONS     1. Left ventricular ejection fraction, by estimation, is 65 to 70%. The  left ventricle has normal function. The left ventricle has no regional  wall motion abnormalities. Left ventricular diastolic parameters are  indeterminate. The average left  ventricular global longitudinal strain is -17.1 %.   2. Right ventricular systolic function is normal. The right ventricular  size is normal. There is normal pulmonary artery systolic pressure.   3. The mitral valve is normal in structure. Trivial mitral valve  regurgitation. No evidence of mitral stenosis.   4. The aortic valve is normal in structure. Aortic valve regurgitation is  not visualized. No aortic stenosis is present.    07/31/2020 Breast MRI   IMPRESSION: 1. Extensive non mass enhancement within the left breast extending from the nipple to the chest wall. Overall findings are most compatible with extensive malignancy involving the majority of the left breast. 2. Indeterminate enhancing mass within the upper inner right breast. 3. Three morphologically abnormal lymph nodes within the left axilla. One of these nodes has been biopsied compatible with metastatic adenopathy.     08/03/2020 Imaging   Bone Scan whole body  IMPRESSION: Increased radiotracer uptake in the mid to lower lumbar regions of uncertain etiology. Correlation with radiography to assess  for potential arthropathy in these areas advised. Increased uptake in major joints is likely of arthropathic etiology. Increased uptake in the mid face is likely due to paranasal sinus disease. Distribution of radiotracer uptake in bony structures elsewhere unremarkable.   08/04/2020 Procedure   PAC placement by Dr [name]   08/05/2020 - 10/28/2020 Neo-Adjuvant Chemotherapy   Neoadjuvant THP q3weeks for 4-6 cycles starting 08/05/20, followed by Herceptin or Kadcyla q3weeks to complete 1 year of treatment. Stopped THP after 1 dose due to poor toleration       -I switched her to [place] starting 08/25/20. Stopped after C2 due to poor toleration (10/28/20)   08/07/2020 Imaging   CT CAP  IMPRESSION: 2.7 cm mass in the upper outer left breast, likely corresponding to the patient's known primary breast neoplasm.   8 mm short axis left axillary node, suspicious for nodal metastasis in this clinical context.   No findings suspicious for distant metastasis.   Endometrium is mildly prominent, measuring 11 mm. Correlate for vaginal bleeding and consider pelvic ultrasound for further evaluation if clinically warranted.   08/11/2020 Pathology Results   Diagnosis Breast, right, needle core biopsy, right breast - PAPILLARY LESION - SEE COMMENT Microscopic Comment The biopsy consists of a papillary lesion with a focal area of adenosis. Immunohistochemistry (SMM, calponin and p63) shows retention of the myoepithelial cell layer in the focus of adenosis. 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 These results were called to [facility] on August 12, 2020.   11/09/2020 -  Chemotherapy      Patient is on Antibody Plan: BREAST TRASTUZUMAB Q21D       CURRENT THERAPY:  -  Neo-adjuvantKadcyla q3weeks starting 08/25/20, D/c after C2 due to poor toleration. PENDING antiestrogen and Herceptin therapy. *** -Symptoms Management   INTERVAL HISTORY:  *** Zakyra Kukuk is here for a follow up. She presents to the clinic ***    REVIEW OF SYSTEMS:  *** Constitutional: Denies fevers, chills or abnormal weight loss Eyes: Denies blurriness of vision Ears, nose, mouth, throat, and face: Denies mucositis or sore throat Respiratory: Denies cough, dyspnea or wheezes Cardiovascular: Denies palpitation, chest discomfort or lower extremity swelling Gastrointestinal:  Denies nausea, heartburn or change in bowel habits Skin: Denies abnormal skin rashes Lymphatics: Denies new lymphadenopathy or easy bruising Neurological:Denies numbness, tingling or new weaknesses Behavioral/Psych: Mood is stable, no new changes  All other systems were reviewed with the patient and are negative.  MEDICAL HISTORY:  Past Medical History:  Diagnosis Date  . Anxiety   . Breast cancer ([place])   . CHF (congestive heart failure) ([place])   . Diabetes mellitus without complication ([place])    type 2  . Hypertension     SURGICAL HISTORY: Past Surgical History:  Procedure Laterality Date  . APPENDECTOMY    . CHOLECYSTECTOMY    . COLONOSCOPY    . ESOPHAGOGASTRODUODENOSCOPY (EGD) WITH PROPOFOL N/A 11/01/2020   Procedure: ESOPHAGOGASTRODUODENOSCOPY (EGD) WITH PROPOFOL;  Surgeon: Arta Silence, MD;  Location: [facility];  Service: Endoscopy;  Laterality: N/A;  . EYE SURGERY     cataracts  . MULTIPLE TOOTH EXTRACTIONS    . PORTACATH PLACEMENT N/A 08/04/2020   Procedure: INSERTION PORT-A-CATH WITH ULTRASOUND GUIDANCE;  Surgeon: [name], MD;  Location: [facility];  Service: General;  Laterality: N/A;  . STOMACH SURGERY      I have reviewed the social history and family history with the patient and they are unchanged from previous note.  ALLERGIES:  is allergic to coconut (cocos nucifera) allergy skin test, hydrochlorothiazide, lisinopril, and coconut oil.  MEDICATIONS:  Current Outpatient Medications  Medication Sig Dispense Refill  . Accu-Chek FastClix  Lancets MISC     . albuterol (VENTOLIN HFA) 108 (90 Base) MCG/ACT inhaler Inhale 1-2 puffs into the lungs every 4 (four) hours as needed for wheezing or shortness of breath. 8 g 0  . amLODipine (NORVASC) 10 MG tablet Take 1 tablet (10 mg total) by mouth daily. 30 tablet 10  . aspirin 81 MG chewable tablet Chew 81 mg by mouth daily.     . bisacodyl (DULCOLAX) 5 MG EC tablet Take 1 tablet (5 mg total) by mouth daily as needed for moderate constipation. 30 tablet 0  . carvedilol (COREG) 25 MG tablet Take 1 tablet (25 mg total) by mouth 2 (two) times daily with a meal. 180 tablet 3  . Cholecalciferol 25 MCG (1000 UT) capsule Take 1,000 Units by mouth daily.    . DULoxetine (CYMBALTA) 30 MG capsule Take 2 capsules (60 mg total) by mouth daily. 60 capsule 0  . folic acid (FOLVITE) 1 MG tablet Take 1 mg by mouth daily.    Marland Kitchen gabapentin (NEURONTIN) 400 MG capsule Take 400 mg by mouth 3 (three) times daily.     Marland Kitchen glipiZIDE (GLUCOTROL XL) 10 MG 24 hr tablet Take 10 mg by mouth daily with breakfast.     . HYDROcodone-acetaminophen (NORCO/VICODIN) 5-325 MG tablet Take 1 tablet by mouth every 6 (six) hours as needed for up to 15 doses for moderate pain. 15 tablet 0  . insulin aspart (NOVOLOG) 100 UNIT/ML injection Inject 2-10 Units into the skin 3 (three) times daily  before meals. Per sliding scale:  150-199 = 2 units, 200-249 = 4 units, 250-299 = 6 units, 300-349 = 8 units, greater than350 = 10 units.    . insulin glargine (LANTUS SOLOSTAR) 100 UNIT/ML Solostar Pen Inject 35 Units into the skin 2 (two) times daily. (Patient taking differently: Inject 35 Units into the skin 2 (two) times daily. Pt takes 70 units in the morning and 70 units at night.) 15 mL 11  . lactulose (CHRONULAC) 10 GM/15ML solution Take 45 mLs (30 g total) by mouth every 4 (four) hours as needed for mild constipation. 236 mL 0  . lidocaine-prilocaine (EMLA) cream APPLY TO AFFECTED AREA 1 time DAILY (Patient taking differently: Apply 1  application topically daily as needed (pain).) 30 g 3  . losartan (COZAAR) 100 MG tablet Take 1 tablet (100 mg total) by mouth daily. 90 tablet 1  . metFORMIN (GLUCOPHAGE) 500 MG tablet Take 500 mg by mouth 2 (two) times daily with a meal.     . morphine (MSIR) 15 MG tablet Take 1 tablet (15 mg total) by mouth every 4 (four) hours as needed for severe pain. 10 tablet 0  . Nutritional Supplements (FEEDING SUPPLEMENT, GLUCERNA 1.2 CAL,) LIQD Take 237 mLs by mouth 3 (three) times daily with meals.    . Omega-3 Fatty Acids (FISH OIL) 1000 MG CAPS Take 1,000 mg by mouth daily.    Marland Kitchen omeprazole (PRILOSEC) 20 MG capsule Take 1 capsule (20 mg total) by mouth daily. 30 capsule 11  . ondansetron (ZOFRAN-ODT) 4 MG disintegrating tablet Take 1 tablet (4 mg total) by mouth 2 (two) times daily as needed for up to 30 doses for nausea or vomiting. 30 tablet 0  . prochlorperazine (COMPAZINE) 10 MG tablet Take 1 tablet (10 mg total) by mouth every 6 (six) hours as needed for nausea or vomiting. 30 tablet 1  . promethazine (PHENERGAN) 25 MG suppository Place 1 suppository (25 mg total) rectally every 8 (eight) hours as needed for nausea or vomiting. 30 each 1  . RESTASIS 0.05 % ophthalmic emulsion Place 1 drop into both eyes 2 (two) times daily.    . rosuvastatin (CRESTOR) 5 MG tablet Take 5 mg by mouth at bedtime.    . senna-docusate (SENOKOT-S) 8.6-50 MG tablet Take 2 tablets by mouth 2 (two) times daily. 120 tablet 0  . spironolactone (ALDACTONE) 25 MG tablet Take 1 tablet (25 mg total) by mouth daily. 90 tablet 1  . sucralfate (CARAFATE) 1 GM/10ML suspension Take 10 mLs (1 g total) by mouth 4 (four) times daily -  with meals and at bedtime. 420 mL 0  . torsemide (DEMADEX) 20 MG tablet Take 2 tablets (40 mg total) by mouth daily. 180 tablet 3  . zolpidem (AMBIEN) 5 MG tablet Take 1 tablet (5 mg total) by mouth at bedtime as needed for sleep. 20 tablet 0   No current facility-administered medications for this visit.     PHYSICAL EXAMINATION: ECOG PERFORMANCE STATUS: {CHL ONC ECOG PS:[phone]}  There were no vitals filed for this visit. There were no vitals filed for this visit. *** GENERAL:alert, no distress and comfortable SKIN: skin color, texture, turgor are normal, no rashes or significant lesions EYES: normal, Conjunctiva are pink and non-injected, sclera clear {OROPHARYNX:no exudate, no erythema and lips, buccal mucosa, and tongue normal}  NECK: supple, thyroid normal size, non-tender, without nodularity LYMPH:  no palpable lymphadenopathy in the cervical, axillary {or inguinal} LUNGS: clear to auscultation and percussion with normal breathing effort  HEART: regular rate & rhythm and no murmurs and no lower extremity edema ABDOMEN:abdomen soft, non-tender and normal bowel sounds Musculoskeletal:no cyanosis of digits and no clubbing  NEURO: alert & oriented x 3 with fluent speech, no focal motor/sensory deficits  LABORATORY DATA:  I have reviewed the data as listed CBC Latest Ref Rng & Units 11/03/2020 11/02/2020 10/31/2020  WBC 4.0 - 10.5 K/uL 7.8 6.5 7.1  Hemoglobin 12.0 - 15.0 g/dL 12.0 12.4 13.1  Hematocrit 36.0 - 46.0 % 36.6 38.2 39.9  Platelets 150 - 400 K/uL 204 192 169     CMP Latest Ref Rng & Units 11/03/2020 11/02/2020 10/31/2020  Glucose 70 - 99 mg/dL 160(H) 109(H) 171(H)  BUN 8 - 23 mg/dL 28(H) 32(H) 19  Creatinine 0.44 - 1.00 mg/dL 1.22(H) 1.49(H) 0.94  Sodium 135 - 145 mmol/L 136 135 134(L)  Potassium 3.5 - 5.1 mmol/L 3.3(L) 3.2(L) 3.5  Chloride 98 - 111 mmol/L 101 96(L) 95(L)  CO2 22 - 32 mmol/L 27 29 28   Calcium 8.9 - 10.3 mg/dL 9.8 10.1 10.5(H)  Total Protein 6.5 - 8.1 g/dL - 6.9 7.6  Total Bilirubin 0.3 - 1.2 mg/dL - 0.7 0.8  Alkaline Phos 38 - 126 U/L - 51 60  AST 15 - 41 U/L - 37 61(H)  ALT 0 - 44 U/L - 42 60(H)      RADIOGRAPHIC STUDIES: I have personally reviewed the radiological images as listed and agreed with the findings in the report. No results found.    ASSESSMENT & PLAN:  Donicia Druck is a 70 y.o. female with    1.Malignant neoplasm of upper-outer quadrant of left breast, StageII, c(T2N1M0), ER+/PR+/HER2+, GradeII -She was diagnosed in 06/2020.She has 12cm calcification and 4.5cm mass in her left breast with2 abnormalLN. 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 -HerMRI Breast from 07/31/20 shows very extensive disease in her left breast and 3 abnormal LNs in the left axilla. There isindeterminate enhancing47mmass within the upper inner right breast. -Her 08/11/20 right breast biopsy showed benign papillary lesion. -Herbone scan from 08/03/20 shows uptake in her lumbar spine, but nonspecific. She has known lumbar back pain from arthritis. Her CT CAP from 08/07/20 showed known breast masses and left axillary LN andnodistant metastasis. She has mild thickening of endometrium, pt denies vaginal bleeding. -Given theextensivedisease in her left breast,shewillneedmastectomy with Dr TCindra Evesthe only way to cure her cancer. -She startedneoadjuvantchemoTHPq3weekson11/17/21. Due to poor toleration after C1, she was switched to KR[employer] Systemon 09/04/20.Plan for 5-6 cycles in total. C2 postponed due to COVID (+) in early 09/2020.  -She tolerated first Kadcyla moderately well with nausea and diarrhea.However, she poorly tolerated C2 withpersistentN&V, Constipation, Abdominal pain, chest pain, insomnia. Will d/c Kadcyla.  -I discussed starting antiestrogen therapy sooner and continuing anti-HER2 treatment with Herceptin q3weeks while we consult on when to proceed with breast surgery. She is open to this. Plan to start after her pain is controlled.    2. Symptoms Management: General body pain, Diarrhea, N&V  -With first week on THP she hadgeneral body pain, mainly in legs, shoulders and neck, diarrhea, N&V, Fatigue.   -She continues to have upper leg pain with cramping. She notes she is taking her potassium. I recommend she increase her Cymbalta. -She had been usingimodium and lomotil for her diarrhea, but has not taken lately. -Her THP was changed to KPortiaon 09/04/20.She moderately tolerated first dose of Kadcyla but second  cycle she had 10 days of significant fatigue and 7-10 days later experienced intractable N&V, abdominal pain, constipation, insomnia and chest pain.  -For insomnia melatonin has not helped. I previously called in short course Ambien to only take as needed, not nightly. This has not been picked up yet.  -For nausea she has zofran and compazine, but not enough. She is struggling to keep anything down including pills. She previously prescribed her phenergan.  -For severe constipation, she can use fleet enema and Doculax suppository. She can otherwise use Miralax as needed going forward. I recommend low residual diet for now. -For painshe has tried Norco andOxycodone, most recently was prescribed Norco on ED 10/26/20 discharge.  -She and her husband have struggled to manage her pain at home with medication. I discussed [facility] hospital admission to control her pain and review better pain management education with her and her family. She is agreeable. Will admit today (10/30/20).   3. Chest pain -For the past 2 days (10/26/20) she has been having new chest pain and underarm burning sensation. Pt notes her Urgent care EKGwas abnormal, but was not of concern on ER work up. Her 10/26/20 ED CT Angio was negativebut she did have slightly elevated troponin. -I discussed her chest pain with her cardiologist Dr. Audie Box and he notes this is not related to her heart condition. WE suspect this pain is related severe Acid reflux or muscular chest pain. I will start her on PPI such as Protonix.    4. Comorbidities: CHF,poorly controlledDM with neuropathy, HTN, Arthritis in hips, Elevated Cr, LE  edema -Due to Nausea and stomach pain fromJardiance, she takes Zofran as needed.  -Continue medications and f/u with PCP and Cardiologist Dr. Marisue Ivan.  -She had 2 falls in 2021due to multiple medications and poor sleep. She ambulates with cane.  -Pt is on Lasix which she notes leads to urinary incontinence. She wears depends for this. -Her baseline ECHO from 07/31/20 was normal. Will monitor on Herceptin. -She has had increased LE edema from her recent CHF exacerbation. She will continue to f/u with Cardiologist.  5. Genetic testing  -Given her mild family history of cancer (mother with Ovarian, niece and 2 cousins with cancer). She agreed to proceed but has not proceeded yet.  6. Social Support -She does have decreased memory, she is on medication. Her family help her with history. -She lives in [place] alone in an apartmentwith other older women. Sheis currently able to take care of herself independently. Her daughter is in town and her son is in [employer]. She has other non-biological children. She notes having a husband Larna Daughters. -She plan to have care given at home soon.She has Medicare and Medicaid.  7. COVID (+) in early 09/2020. She was asymptomatic and did not require treatment. She tested negative in later 09/2020. She has recovered well  PLAN: -Due to her intractable N/V and uncontrolled chest pain, will admit to [facility], Dr. Neysa Bonito has kindly agreed to admit her  -I called [facility] Dr. Alessandra Bevels and he will see pt when she gets admitted today  -I called pt's husband and daughter today    No problem-specific Assessment & Plan notes found for this encounter.   No orders of the defined types were placed in this encounter.  All questions were answered. The patient knows to call the clinic with any problems, questions or concerns. No barriers to learning was detected. The total time spent in the appointment was {CHL ONC TIME VISIT -  CMKLK:[id number]}.      Cheryl Blankenship 11/06/2020   Oneal Deputy, am acting as scribe for [name], MD.   {Add scribe attestation statement}

## 2020-11-06 NOTE — Telephone Encounter (Signed)
FYI. Thanks.

## 2020-11-06 NOTE — Telephone Encounter (Signed)
Just an FYI. We have made several attempts to contact this patient including sending a letter to schedule or reschedule their echocardiogram. We will be removing the patient from the echo [place].  10/30/20 NO SHOW-MAILED LETTER [facility]     Thank you

## 2020-11-09 ENCOUNTER — Inpatient Hospital Stay: Payer: Medicare HMO

## 2020-11-09 ENCOUNTER — Ambulatory Visit: Payer: Medicare HMO | Admitting: Hematology

## 2020-11-09 ENCOUNTER — Other Ambulatory Visit: Payer: Medicare HMO

## 2020-11-09 ENCOUNTER — Encounter: Payer: Self-pay | Admitting: *Deleted

## 2020-11-09 ENCOUNTER — Ambulatory Visit: Payer: Medicare HMO

## 2020-11-09 DIAGNOSIS — C50412 Malignant neoplasm of upper-outer quadrant of left female breast: Secondary | ICD-10-CM

## 2020-11-09 LAB — H. PYLORI ANTIBODY, IGG: H Pylori IgG: 0.61 {index_val} (ref 0.00–0.79)

## 2020-11-24 ENCOUNTER — Other Ambulatory Visit: Payer: Self-pay | Admitting: Hematology

## 2020-11-24 ENCOUNTER — Telehealth: Payer: Self-pay

## 2020-11-24 ENCOUNTER — Telehealth: Payer: Self-pay | Admitting: Hematology

## 2020-11-24 NOTE — Telephone Encounter (Signed)
-----   Message from [name], MD sent at 11/24/2020 11:29 AM EST ----- Regarding: RE: Stopping treatment I need to see pt, please set up an OV this week, and encourage her daughter coming in with her. I will order scan when I see her. OK to schedule at 12N, or 12:40 or 4pm if no other options, no lab.   Thanks   Krista Blue  ----- Message ----- From: Kelli Hope, LPN Sent: 04/21/7792  96:88 AM EST To: [name], MD, Royston Bake, RN Subject: Stopping treatment                             Hi Dr Burr Medico, received a call from above Pt's daughter. She stated that Pt. Has decided to stop chemo and wants her port removed. Before this takes place she would like a MRI to just get information about if the cancer spread or shrunk. Daughter wants to speak with you before this process and wants your feedback on this decision. [phone]

## 2020-11-24 NOTE — Telephone Encounter (Signed)
Spoke with Pt's daughter to inform her that Dr Burr Medico would like to see her in the office and discuss this decision and wants the daughter to come as well. Also informed her that she will order the scan after the visit. Daughter verbalized understanding stated she will accompany Pt to appointment. Schedule message sent to schedule pt for appointment.

## 2020-11-24 NOTE — Telephone Encounter (Signed)
Scheduled follow-up appointment per 3/8 schedule message. Patient's daughter is aware.

## 2020-11-25 NOTE — Progress Notes (Signed)
[place]   [phone] [phone]   Clinic Follow up Note   Patient Care Team: [name], PA-C as PCP - General (Internal Medicine) [name], RN as Oncology Nurse Navigator [name], RN as Oncology Nurse Navigator [name], MD as Consulting Physician (General Surgery) [name], MD as Consulting Physician (Hematology) [name], MD as Consulting Physician (Radiation Oncology) [name], [name], MD as Consulting Physician (Cardiology)  Date of Service:  11/26/2020  CHIEF COMPLAINT: F/u of left breast cancer  SUMMARY OF ONCOLOGIC HISTORY: Oncology History Overview Note  Cancer Staging Malignant neoplasm of upper-outer quadrant of left breast in female, estrogen receptor positive ([place]) Staging form: Breast, AJCC 8th Edition - Clinical stage from 07/16/2020: Stage IB (cT2, cN1, cM0, G2, ER+, PR+, HER2+) - Signed by [name], MD on 07/21/2020    Malignant neoplasm of upper-outer quadrant of left breast in female, estrogen receptor positive ([place])  07/01/2020 Mammogram   IMPRESSION: 1. There is a 12 cm span of highly suspicious calcifications involving the upper outer and upper inner quadrants of the left breast. On ultrasound, within this area of calcifications, there are confluent masses at 1 o'clock spanning at least 4.5 cm. An additional suspicious mass is seen in the left breast at 2 o'clock.  -Ultrasound targeted to the left breast at 1 o'clock, 5 cm from the nipple demonstrates a broad area of hypoechoic irregular tissue/confluent masses spanning at least 4.5 cm. There is a separate oval hypoechoic mass with indistinct margins at 2 o'clock, 7 cm from the nipple measuring 0.9 x 0.4 x 0.9 cm. Ultrasound of the left axilla demonstrates 2 abnormally thickened lymph nodes with cortices measuring 6-7 mm. There is an additional lymph node with a borderline cortex of 4 mm.   2. There are 2 lymph nodes  with thickened cortices in the left axilla, and an additional lymph node with a borderline thickened cortex.   07/16/2020 Cancer Staging   Staging form: Breast, AJCC 8th Edition - Clinical stage from 07/16/2020: Stage IIA (cT3, cN1, cM0, G2, ER+, PR+, HER2+) - Signed by [name], MD on 08/05/2020   07/16/2020 Initial Biopsy   Diagnosis 1. 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 Breast, left, needle core biopsy, 1 o'clock - INVASIVE DUCTAL CARCINOMA - SEE COMMENT 3. Lymph node, biopsy - METASTATIC CARCINOMA INVOLVING A LYMPH NODE - SEE COMMENT Microscopic Comment 1. Based on the biopsy, the ductal carcinoma in situ has a comedo pattern, high nuclear grade and measures 0.7 cm in greatest linear extent. Prognostic markers (ER/PR) are pending and will be reported in an addendum. 2. Based on the biopsy, the carcinoma appears Nottingham grade 2 of 3 and measures 1.5 cm in greatest linear extent. Prognostic markers (ER/PR/ki-67/HER2) are pending and will be reported in an addendum. Dr. Saralyn Pilar reviewed the case and agrees with the above diagnosis. These results were called to [facility] on July 17, 2020. 3. Based on the biopsy, the carcinoma appears measures 1.0 cm in greatest linear extent. Prognostic markers (ER/PR/ki-67/HER2) are pending and will be reported in an addendum.   07/16/2020 Receptors her2   1. PROGNOSTIC INDICATORS Results: IMMUNOHISTOCHEMICAL AND MORPHOMETRIC ANALYSIS PERFORMED MANUALLY Estrogen Receptor: 30%, POSITIVE, STRONG STAINING INTENSITY Progesterone Receptor: 5%, POSITIVE, STRONG STAINING INTENSITY  2. PROGNOSTIC INDICATORS Results: IMMUNOHISTOCHEMICAL AND MORPHOMETRIC ANALYSIS PERFORMED MANUALLY The tumor cells are POSITIVE for  Her2 (3+). Estrogen Receptor: 85%, POSITIVE, STRONG STAINING INTENSITY Progesterone Receptor:  60%, POSITIVE, STRONG STAINING INTENSITY Proliferation Marker Ki67: 25%  3. PROGNOSTIC INDICATORS Results: IMMUNOHISTOCHEMICAL AND MORPHOMETRIC ANALYSIS PERFORMED MANUALLY The tumor cells are POSITIVE for Her2 (3+). Estrogen Receptor: 95%, POSITIVE, STRONG STAINING INTENSITY Progesterone Receptor: 45%, POSITIVE, STRONG STAINING INTENSITY Proliferation Marker Ki67: 30%   07/20/2020 Initial Diagnosis   Malignant neoplasm of upper-outer quadrant of left breast in female, estrogen receptor positive ([place])   07/31/2020 Echocardiogram   Baseline Echo  IMPRESSIONS     1. Left ventricular ejection fraction, by estimation, is 65 to 70%. The  left ventricle has normal function. The left ventricle has no regional  wall motion abnormalities. Left ventricular diastolic parameters are  indeterminate. The average left  ventricular global longitudinal strain is -17.1 %.   2. Right ventricular systolic function is normal. The right ventricular  size is normal. There is normal pulmonary artery systolic pressure.   3. The mitral valve is normal in structure. Trivial mitral valve  regurgitation. No evidence of mitral stenosis.   4. The aortic valve is normal in structure. Aortic valve regurgitation is  not visualized. No aortic stenosis is present.    07/31/2020 Breast MRI   IMPRESSION: 1. Extensive non mass enhancement within the left breast extending from the nipple to the chest wall. Overall findings are most compatible with extensive malignancy involving the majority of the left breast. 2. Indeterminate enhancing mass within the upper inner right breast. 3. Three morphologically abnormal lymph nodes within the left axilla. One of these nodes has been biopsied compatible with metastatic adenopathy.     08/03/2020 Imaging   Bone Scan whole body  IMPRESSION: Increased radiotracer uptake in the mid to lower lumbar regions of uncertain etiology. Correlation with radiography to assess  for potential arthropathy in these areas advised. Increased uptake in major joints is likely of arthropathic etiology. Increased uptake in the mid face is likely due to paranasal sinus disease. Distribution of radiotracer uptake in bony structures elsewhere unremarkable.   08/04/2020 Procedure   PAC placement by Dr [name]   08/05/2020 - 10/28/2020 Neo-Adjuvant Chemotherapy   Neoadjuvant THP q3weeks for 4-6 cycles starting 08/05/20, followed by Herceptin or Kadcyla q3weeks to complete 1 year of treatment. Stopped THP after 1 dose due to poor toleration       -I switched her to [place] starting 08/25/20. Stopped after C2 due to poor toleration (10/28/20)   08/07/2020 Imaging   CT CAP  IMPRESSION: 2.7 cm mass in the upper outer left breast, likely corresponding to the patient's known primary breast neoplasm.   8 mm short axis left axillary node, suspicious for nodal metastasis in this clinical context.   No findings suspicious for distant metastasis.   Endometrium is mildly prominent, measuring 11 mm. Correlate for vaginal bleeding and consider pelvic ultrasound for further evaluation if clinically warranted.   08/11/2020 Pathology Results   Diagnosis Breast, right, needle core biopsy, right breast - PAPILLARY LESION - SEE COMMENT Microscopic Comment The biopsy consists of a papillary lesion with a focal area of adenosis. Immunohistochemistry (SMM, calponin and p63) shows retention of the myoepithelial cell layer in the focus of adenosis. 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 These results were called to [facility] on August 12, 2020.   10/27/2020 Imaging   CT Angio chest  IMPRESSION: Slightly suboptimal opacification of the main pulmonary artery, however no central or proximal segmental pulmonary embolism  Minimal bibasilar atelectasis   Aortic Atherosclerosis (ICD10-I70.0).   10/31/2020 Imaging   CT AP  IMPRESSION: 1.  No acute finding by CT other than a large amount of stool and gas in the right colon and gas in the transverse colon. No small bowel obstruction. 2. Previous cholecystectomy. 3. Aortic atherosclerosis. 4. Umbilical hernia containing only fat. 5. Small amount of fluid in the endometrial canal as seen previously.   Aortic Atherosclerosis (ICD10-I70.0).      CURRENT THERAPY:  -PENDING antiestrogen and Herceptin therapy.  -Symptoms Management   INTERVAL HISTORY:  Cheryl Blankenship is here for a follow up. She was admitted for chest pain last month, which has resolved.  Feeling better, still has diffuse tingling in whole body  No recurrent chest pain, but has some pain in bilateral axilla, has limited ROM of both shoulders, especially on right side. She was seen by PCP  She is constipated, she is on lactulose now  No leg edema Still very fatigue, with low appetite, weight is stable   All other systems were reviewed with the patient and are negative.  MEDICAL HISTORY:  Past Medical History:  Diagnosis Date  . Anxiety   . Breast cancer ([place])   . CHF (congestive heart failure) ([place])   . Diabetes mellitus without complication ([place])    type 2  . Hypertension     SURGICAL HISTORY: Past Surgical History:  Procedure Laterality Date  . APPENDECTOMY    . CHOLECYSTECTOMY    . COLONOSCOPY    . ESOPHAGOGASTRODUODENOSCOPY (EGD) WITH PROPOFOL N/A 11/01/2020   Procedure: ESOPHAGOGASTRODUODENOSCOPY (EGD) WITH PROPOFOL;  Surgeon: Arta Silence, MD;  Location: [facility];  Service: Endoscopy;  Laterality: N/A;  . EYE SURGERY     cataracts  . MULTIPLE TOOTH EXTRACTIONS    . PORTACATH PLACEMENT N/A 08/04/2020   Procedure: INSERTION PORT-A-CATH WITH ULTRASOUND GUIDANCE;  Surgeon: [name], MD;  Location: [facility];  Service: General;  Laterality: N/A;  . STOMACH SURGERY      I have reviewed the social history and family history with the patient and they are unchanged from previous  note.  ALLERGIES:  is allergic to coconut (cocos nucifera) allergy skin test, hydrochlorothiazide, lisinopril, and coconut oil.  MEDICATIONS:  Current Outpatient Medications  Medication Sig Dispense Refill  . ALPRAZolam (XANAX) 0.25 MG tablet Take 1-2 tablets (0.25-0.5 mg total) by mouth 3 (three) times daily as needed for anxiety or sleep. 30 tablet 0  . Accu-Chek FastClix Lancets MISC     . albuterol (VENTOLIN HFA) 108 (90 Base) MCG/ACT inhaler Inhale 1-2 puffs into the lungs every 4 (four) hours as needed for wheezing or shortness of breath. 8 g 0  . amLODipine (NORVASC) 10 MG tablet Take 1 tablet (10 mg total) by mouth daily. 30 tablet 10  . aspirin 81 MG chewable tablet Chew 81 mg by mouth daily.     . bisacodyl (DULCOLAX) 5 MG EC tablet Take 1 tablet (5 mg total) by mouth daily as needed for moderate constipation. 30 tablet 0  . carvedilol (COREG) 25 MG tablet Take 1 tablet (25 mg total) by mouth 2 (two) times daily with a meal. 180 tablet 3  . Cholecalciferol 25 MCG (1000 UT) capsule Take 1,000 Units by mouth daily.    . DULoxetine (CYMBALTA) 30 MG capsule Take 2 capsules (60 mg total) by mouth daily. 60 capsule 0  . folic acid (FOLVITE) 1 MG tablet Take 1 mg by mouth daily.    Marland Kitchen gabapentin (  NEURONTIN) 400 MG capsule Take 400 mg by mouth 3 (three) times daily.     Marland Kitchen glipiZIDE (GLUCOTROL XL) 10 MG 24 hr tablet Take 10 mg by mouth daily with breakfast.     . HYDROcodone-acetaminophen (NORCO/VICODIN) 5-325 MG tablet Take 1 tablet by mouth every 6 (six) hours as needed for up to 15 doses for moderate pain. 15 tablet 0  . insulin aspart (NOVOLOG) 100 UNIT/ML injection Inject 2-10 Units into the skin 3 (three) times daily before meals. Per sliding scale:  150-199 = 2 units, 200-249 = 4 units, 250-299 = 6 units, 300-349 = 8 units, greater than350 = 10 units.    . insulin glargine (LANTUS SOLOSTAR) 100 UNIT/ML Solostar Pen Inject 35 Units into the skin 2 (two) times daily. (Patient taking  differently: Inject 35 Units into the skin 2 (two) times daily. Pt takes 70 units in the morning and 70 units at night.) 15 mL 11  . lactulose (CHRONULAC) 10 GM/15ML solution Take 45 mLs (30 g total) by mouth every 4 (four) hours as needed for mild constipation. 236 mL 0  . lidocaine-prilocaine (EMLA) cream APPLY TO AFFECTED AREA 1 time DAILY (Patient taking differently: Apply 1 application topically daily as needed (pain).) 30 g 3  . losartan (COZAAR) 100 MG tablet Take 1 tablet (100 mg total) by mouth daily. 90 tablet 1  . metFORMIN (GLUCOPHAGE) 500 MG tablet Take 500 mg by mouth 2 (two) times daily with a meal.     . morphine (MSIR) 15 MG tablet Take 1 tablet (15 mg total) by mouth every 4 (four) hours as needed for severe pain. 10 tablet 0  . Nutritional Supplements (FEEDING SUPPLEMENT, GLUCERNA 1.2 CAL,) LIQD Take 237 mLs by mouth 3 (three) times daily with meals.    . Omega-3 Fatty Acids (FISH OIL) 1000 MG CAPS Take 1,000 mg by mouth daily.    Marland Kitchen omeprazole (PRILOSEC) 20 MG capsule Take 1 capsule (20 mg total) by mouth daily. 30 capsule 11  . ondansetron (ZOFRAN-ODT) 4 MG disintegrating tablet Take 1 tablet (4 mg total) by mouth 2 (two) times daily as needed for up to 30 doses for nausea or vomiting. 30 tablet 0  . prochlorperazine (COMPAZINE) 10 MG tablet Take 1 tablet (10 mg total) by mouth every 6 (six) hours as needed for nausea or vomiting. 30 tablet 1  . promethazine (PHENERGAN) 25 MG suppository Place 1 suppository (25 mg total) rectally every 8 (eight) hours as needed for nausea or vomiting. 30 each 1  . RESTASIS 0.05 % ophthalmic emulsion Place 1 drop into both eyes 2 (two) times daily.    . rosuvastatin (CRESTOR) 5 MG tablet Take 5 mg by mouth at bedtime.    . senna-docusate (SENOKOT-S) 8.6-50 MG tablet Take 2 tablets by mouth 2 (two) times daily. 120 tablet 0  . spironolactone (ALDACTONE) 25 MG tablet Take 1 tablet (25 mg total) by mouth daily. 90 tablet 1  . sucralfate (CARAFATE) 1  GM/10ML suspension Take 10 mLs (1 g total) by mouth 4 (four) times daily -  with meals and at bedtime. 420 mL 0  . torsemide (DEMADEX) 20 MG tablet Take 2 tablets (40 mg total) by mouth daily. 180 tablet 3  . zolpidem (AMBIEN) 5 MG tablet Take 1 tablet (5 mg total) by mouth at bedtime as needed for sleep. 20 tablet 0   No current facility-administered medications for this visit.    PHYSICAL EXAMINATION: ECOG PERFORMANCE STATUS: 2 - Symptomatic, <50% confined  to bed  Vitals:   11/26/20 1247  BP: 115/67  Pulse: 73  Resp: 18  Temp: 98.6 F (37 C)  SpO2: 97%   Filed Weights   11/26/20 1247  Weight: 206 lb 3.2 oz (93.5 kg)    GENERAL:alert, no distress and comfortable SKIN: skin color, texture, turgor are normal, no rashes or significant lesions EYES: normal, Conjunctiva are pink and non-injected, sclera clear NECK: supple, thyroid normal size, non-tender, without nodularity LYMPH:  no palpable lymphadenopathy in the cervical, axillary  LUNGS: clear to auscultation and percussion with normal breathing effort HEART: regular rate & rhythm and no murmurs and no lower extremity edema ABDOMEN:abdomen soft, non-tender and normal bowel sounds Musculoskeletal:no cyanosis of digits and no clubbing  NEURO: alert & oriented x 3 with fluent speech, no focal motor/sensory deficits Breasts: Breast inspection showed them to be symmetrical with no nipple discharge. Palpation of the breasts showed a soft mass 3.5X2cm in upper outer quadrant of left breast, and a 1-2 cm node in left axillar, no other obvious mass that I could appreciate.   LABORATORY DATA:  I have reviewed the data as listed CBC Latest Ref Rng & Units 11/03/2020 11/02/2020 10/31/2020  WBC 4.0 - 10.5 K/uL 7.8 6.5 7.1  Hemoglobin 12.0 - 15.0 g/dL 12.0 12.4 13.1  Hematocrit 36.0 - 46.0 % 36.6 38.2 39.9  Platelets 150 - 400 K/uL 204 192 169     CMP Latest Ref Rng & Units 11/03/2020 11/02/2020 10/31/2020  Glucose 70 - 99 mg/dL 160(H)  109(H) 171(H)  BUN 8 - 23 mg/dL 28(H) 32(H) 19  Creatinine 0.44 - 1.00 mg/dL 1.22(H) 1.49(H) 0.94  Sodium 135 - 145 mmol/L 136 135 134(L)  Potassium 3.5 - 5.1 mmol/L 3.3(L) 3.2(L) 3.5  Chloride 98 - 111 mmol/L 101 96(L) 95(L)  CO2 22 - 32 mmol/L _0 Calcium 8.9 - 10.3 mg/dL 9.8 10.1 10.5(H)  Total Protein 6.5 - 8.1 g/dL - 6.9 7.6  Total Bilirubin 0.3 - 1.2 mg/dL - 0.7 0.8  Alkaline Phos 38 - 126 U/L - 51 60  AST 15 - 41 U/L - 37 61(H)  ALT 0 - 44 U/L - 42 60(H)      RADIOGRAPHIC STUDIES: I have personally reviewed the radiological images as listed and agreed with the findings in the report. No results found.   ASSESSMENT & PLAN:  Lorry Furber is a 70 y.o. female with   1.Malignant neoplasm of upper-outer quadrant of left breast, StageII, c(T2N1M0), ER+/PR+/HER2+, GradeII -She was diagnosed in 06/2020.She has 12cm calcification and 4.5cm mass in her left breast with2 abnormalLN. 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 -HerMRI Breast from 07/31/20 shows very extensive disease in her left breast and 3 abnormal LNs in the left axilla. There isindeterminate enhancing55mmass within the upper inner right breast. -Her 08/11/20 right breast biopsy showed benign papillary lesion. -Herbone scan from 08/03/20 shows uptake in her lumbar spine, but nonspecific. She has known lumbar back pain from arthritis. Her CT CAP from 08/07/20 showed known breast masses and left axillary LN andnodistant metastasis. She has mild thickening of endometrium, pt denies vaginal bleeding. -Given theextensivedisease in her left breast,shewillneedmastectomy with Dr TCindra Evesthe only way to cure her cancer. -She startedneoadjuvantchemoTHPq3weekson11/17/21. Due to poor toleration after C1, she was switched to [employer]on 09/04/20. C2 postponed due to COVID (+) in early 09/2020.  Due to poor toleration of C2 Kadcyla,  chemo held since cycle 2.  -She has recovered well from her recent hospitalization a month ago.  Exam showed decreased size of left breast mass and axillary adenopathy. -I recommend breast MRI to evaluate her response to limited to neoadjuvant chemotherapy, and proceed with surgery afterwards. -I recommend her to start aromatase inhibitor exemestane and Herceptin in the next few weeks, and continue after surgery, to reduce her risk of recurrence.  Due to her very poor tolerance to chemotherapy, I do not plan to give her additional chemo after surgery. Pt does not want more chemo  -We will refer her back to her surgeon Dr. [name]  2. Diffuse diabetic neuropathy  -She has pre-existing neuropathy, was on Neurontin and Cymbalta before cancer diagnosis, got worse after chemo  -She will continue Cymbalta and Neurontin -Physical therapy referral due to her recent deconditioning   3. Chest pain -Onset 10/26/20. Her 10/26/20 ED CT Angio was negativebut she did have slightly elevated troponin. -I discussed her chest pain with her cardiologist Dr. Audie Box and he notes this is not related to her heart condition. We suspect this pain is related severe Acid reflux or muscular chest pain. I will start her on PPI such as Protonix.  -resolved now   4. Comorbidities: CHF,poorly controlledDM with neuropathy, HTN, Arthritis in hips, Elevated Cr, LE edema -Due to Nausea and stomach pain fromJardiance, she takes Zofran as needed.  -Continue medications and f/u with PCP and Cardiologist Dr. Marisue Ivan.  -She had 2 falls in 2021due to multiple medications and poor sleep. She ambulates with cane.  -Pt is on Lasix which she notes leads to urinary incontinence. She wears depends for this. -Her baseline ECHO from 07/31/20 was normal. Will monitor on Herceptin. -She has had increased LE edema from her recent CHF exacerbation. She will continue to f/u with Cardiologist.  5.  Genetic testing, she agreed to proceed but has not proceeded with testing yet.  6. Social Support -She does have decreased memory, she is on medication. Her family help her with history. -She lives in [place] alone in an apartmentwith other older women. Sheis currently able to take care of herself independently. Her daughter is in town and her son is in [place]. She has other non-biological children. She notes having a husband Larna Daughters. -She plan to have care given at home soon.She has Medicare and Medicaid.  7. COVID (+) in early 09/2020. She was asymptomatic and did not require treatment. She tested negative in later 09/2020. She has recovered well   PLAN: -breast MRI in 1-2 weeks -f/u with Dr. [name] to schedule breast surgery  -f/u in 2 weeks to start Herceptin injection and exemestane   No problem-specific Assessment & Plan notes found for this encounter.   Orders Placed This Encounter  Procedures  . MR BREAST BILATERAL W WO CONTRAST INC CAD    Standing Status:   Future    Standing Expiration Date:   11/26/2021    Order Specific Question:   If indicated for the ordered procedure, I authorize the administration of contrast media per Radiology protocol    Answer:   Yes    Order Specific Question:   What is the patient's sedation requirement?    Answer:   No Sedation    Order Specific Question:   Does the patient have a pacemaker or implanted devices?    Answer:   No    Order Specific Question:   Radiology Contrast Protocol - do NOT remove file path  Answer:   [email or web address]    Order Specific Question:   Preferred imaging location?    Answer:   [facility] (table limit-550lbs)  . Ambulatory referral to Physical Therapy    Referral Priority:   Routine    Referral Type:   Physical Medicine    Referral Reason:   Specialty Services Required    Requested Specialty:   Physical Therapy    Number of Visits Requested:    1   All questions were answered. The patient knows to call the clinic with any problems, questions or concerns. No barriers to learning was detected. The total time spent in the appointment was 30 minutes.     [name], MD 11/26/2020   I, Joslyn Devon, am acting as scribe for [name], MD.   I have reviewed the above documentation for accuracy and completeness, and I agree with the above.

## 2020-11-26 ENCOUNTER — Encounter: Payer: Self-pay | Admitting: Hematology

## 2020-11-26 ENCOUNTER — Inpatient Hospital Stay: Payer: Medicare HMO | Attending: Hematology | Admitting: Hematology

## 2020-11-26 ENCOUNTER — Other Ambulatory Visit: Payer: Self-pay

## 2020-11-26 VITALS — BP 115/67 | HR 73 | Temp 98.6°F | Resp 18 | Ht 63.0 in | Wt 206.2 lb

## 2020-11-26 DIAGNOSIS — E1140 Type 2 diabetes mellitus with diabetic neuropathy, unspecified: Secondary | ICD-10-CM | POA: Diagnosis not present

## 2020-11-26 DIAGNOSIS — Z794 Long term (current) use of insulin: Secondary | ICD-10-CM | POA: Insufficient documentation

## 2020-11-26 DIAGNOSIS — E119 Type 2 diabetes mellitus without complications: Secondary | ICD-10-CM | POA: Diagnosis not present

## 2020-11-26 DIAGNOSIS — C50412 Malignant neoplasm of upper-outer quadrant of left female breast: Secondary | ICD-10-CM

## 2020-11-26 DIAGNOSIS — I509 Heart failure, unspecified: Secondary | ICD-10-CM | POA: Diagnosis not present

## 2020-11-26 DIAGNOSIS — I152 Hypertension secondary to endocrine disorders: Secondary | ICD-10-CM

## 2020-11-26 DIAGNOSIS — I110 Hypertensive heart disease with heart failure: Secondary | ICD-10-CM | POA: Insufficient documentation

## 2020-11-26 DIAGNOSIS — R5383 Other fatigue: Secondary | ICD-10-CM | POA: Diagnosis not present

## 2020-11-26 DIAGNOSIS — E1169 Type 2 diabetes mellitus with other specified complication: Secondary | ICD-10-CM

## 2020-11-26 DIAGNOSIS — Z5112 Encounter for antineoplastic immunotherapy: Secondary | ICD-10-CM | POA: Insufficient documentation

## 2020-11-26 DIAGNOSIS — Z170 Estrogen receptor positive status [ER+]: Secondary | ICD-10-CM

## 2020-11-26 DIAGNOSIS — E669 Obesity, unspecified: Secondary | ICD-10-CM

## 2020-11-26 DIAGNOSIS — Z8616 Personal history of COVID-19: Secondary | ICD-10-CM | POA: Insufficient documentation

## 2020-11-26 DIAGNOSIS — E1159 Type 2 diabetes mellitus with other circulatory complications: Secondary | ICD-10-CM

## 2020-11-26 MED ORDER — ALPRAZOLAM 0.25 MG PO TABS: 0.2500 mg | ORAL_TABLET | Freq: Three times a day (TID) | ORAL | 0 refills | Status: DC | PRN

## 2020-11-27 ENCOUNTER — Telehealth: Payer: Self-pay | Admitting: Hematology

## 2020-11-27 NOTE — Telephone Encounter (Signed)
Added infusion appointment and cancelled injection appointment per 3/11 schedule message. Patient's daughter is aware of changes.

## 2020-11-30 ENCOUNTER — Other Ambulatory Visit: Payer: Medicare HMO

## 2020-11-30 ENCOUNTER — Encounter: Payer: Self-pay | Admitting: *Deleted

## 2020-11-30 ENCOUNTER — Ambulatory Visit: Payer: Medicare HMO | Admitting: Hematology

## 2020-11-30 ENCOUNTER — Ambulatory Visit: Payer: Medicare HMO

## 2020-12-03 ENCOUNTER — Telehealth: Payer: Self-pay | Admitting: *Deleted

## 2020-12-03 NOTE — Telephone Encounter (Signed)
Confirmed patient's prescription benefit information with [facility] as [facility] eligibility not found per CoverMyMeds KEY: [id number] for Alprazolam.  Pharmacy report alprazolam went through insurance is ready for pick-up with 0.35.  No further PA activity needed.

## 2020-12-04 ENCOUNTER — Ambulatory Visit
Admission: RE | Admit: 2020-12-04 | Discharge: 2020-12-04 | Disposition: A | Discharge status: Home / Self Care | Payer: Medicare HMO | Source: Ambulatory Visit | Attending: Hematology | Admitting: Hematology

## 2020-12-04 ENCOUNTER — Other Ambulatory Visit: Payer: Self-pay

## 2020-12-04 DIAGNOSIS — Z170 Estrogen receptor positive status [ER+]: Secondary | ICD-10-CM

## 2020-12-04 DIAGNOSIS — C50412 Malignant neoplasm of upper-outer quadrant of left female breast: Secondary | ICD-10-CM

## 2020-12-04 IMAGING — MR MR BREAST BILAT WO/W CM
8 of 12 series · 31 of 48 positions shown · IV contrast (10 ml gadavist)
Comparison: Previous exams including breast MRI dated [DATE].

CLINICAL DATA: Known LEFT breast cancer. Assess treatment response.

LABS:  Formed at [REDACTED].
EXAM:
BILATERAL BREAST MRI WITH AND WITHOUT CONTRAST
TECHNIQUE: Multiplanar, multisequence MR images of both breasts were obtained
prior to and following the intravenous administration of 10 ml of
Gadavist

[Series 2: t2_tirm_tra ipat (a-p) · axial · 3.0mm · 0.74mm/px · 1 of 55 slices shown]
[im 1/55]
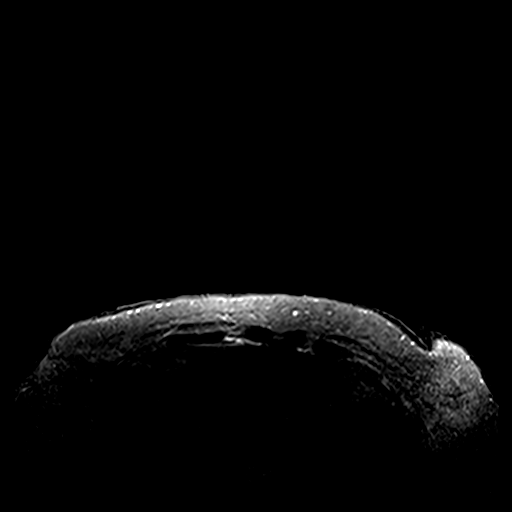

[Series 3: fl3d pre-cm no · axial · non-contrast · 1.2mm · 0.99mm/px · z∈[-50,+121]mm · 5 of 144 slices shown]
[im 1/144]
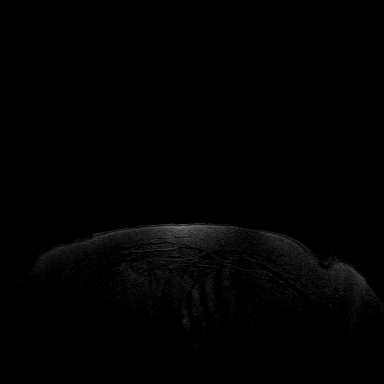
[im 36/144]
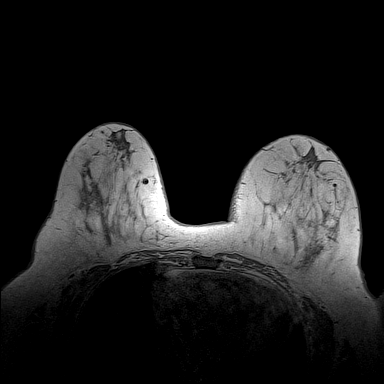
[im 72/144]
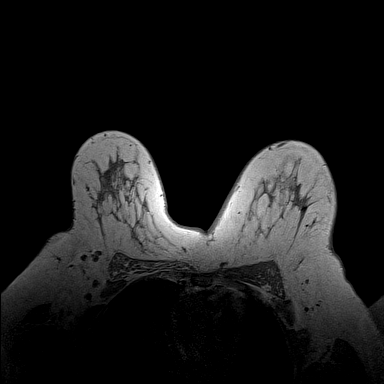
[im 108/144]
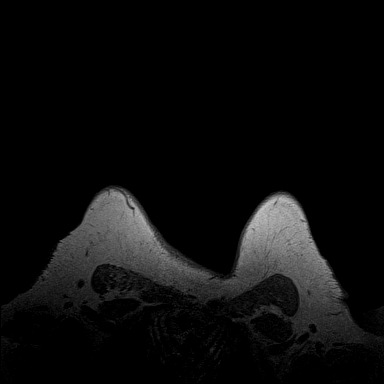
[im 144/144]
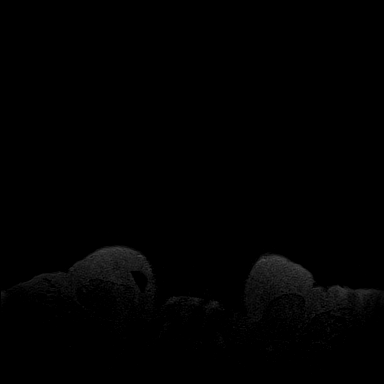

[Series 4: fl3d pre-cm · axial · non-contrast · 1.2mm · 0.99mm/px · z∈[-50,+121]mm · 5 of 144 slices shown]
[im 1/144]
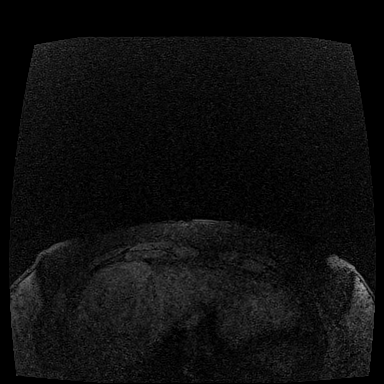
[im 36/144]
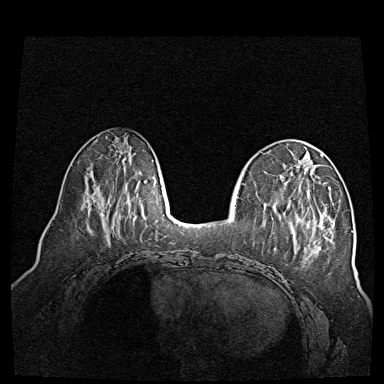
[im 72/144]
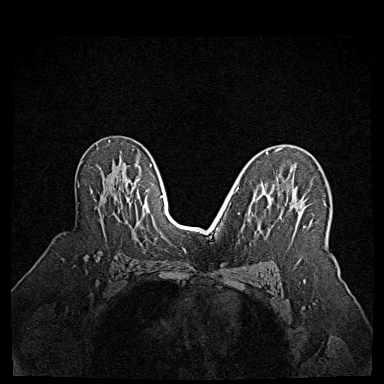
[im 108/144]
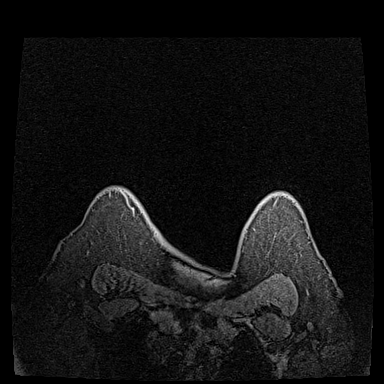
[im 144/144]
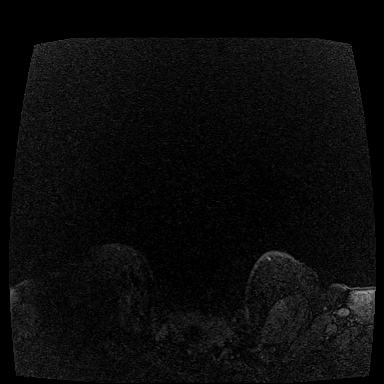

[Series 5: fl3d post-cm 20 · axial · 1.2mm · 0.99mm/px · z∈[-50,+121]mm · 5 of 144 slices shown (1 of 3)]
[im 1/144]
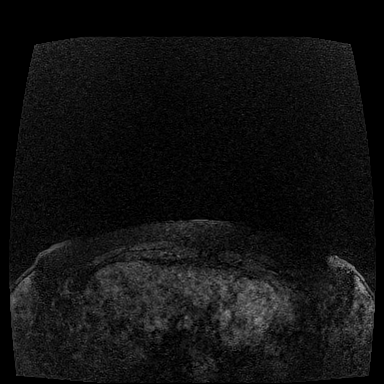
[im 36/144]
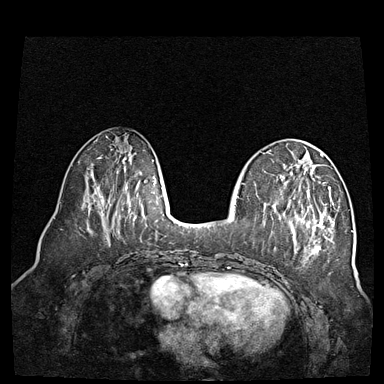
[im 72/144]
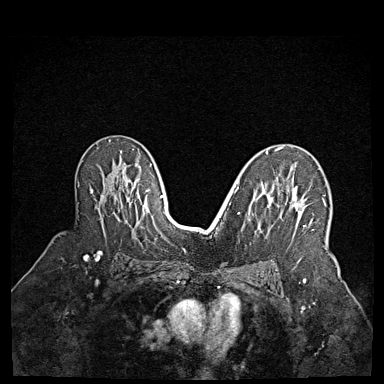
[im 108/144]
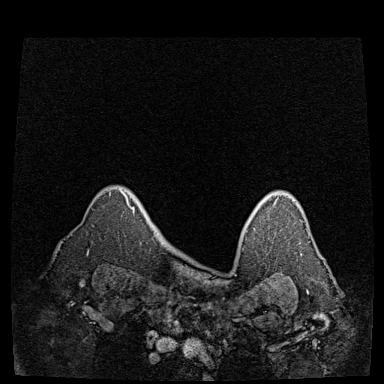
[im 144/144]
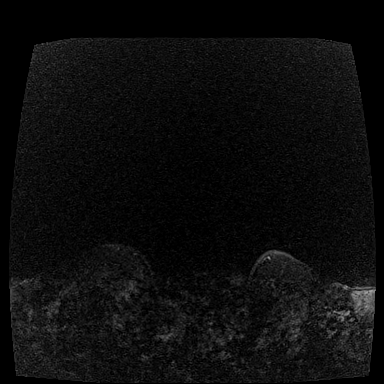

[Series 6: fl3d post-cm 20 · axial · 1.2mm · 0.99mm/px · z∈[-50,+121]mm · 5 of 144 slices shown (2 of 3)]
[im 1/144]
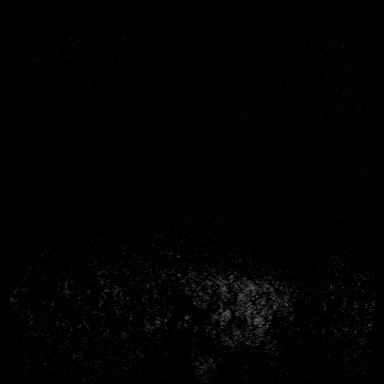
[im 36/144]
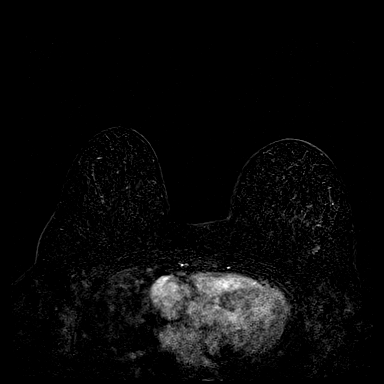
[im 72/144]
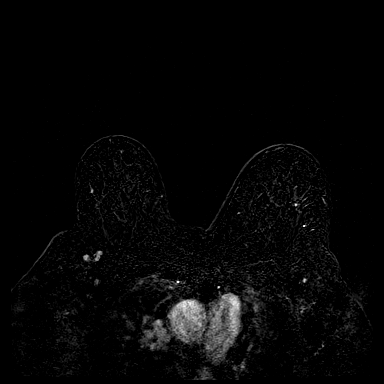
[im 108/144]
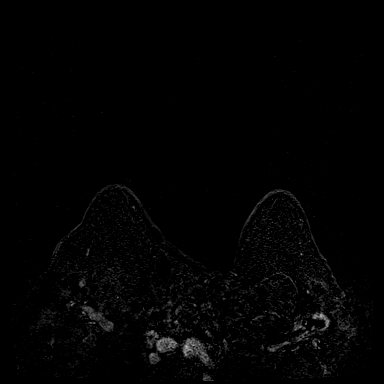
[im 144/144]
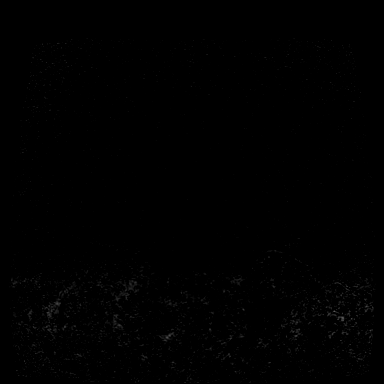

[Series 7: fl3d post-cm 20 · axial · 172.8mm · 0.99mm/px · 1 of 1 slices shown (3 of 3)]
[im 1/1]
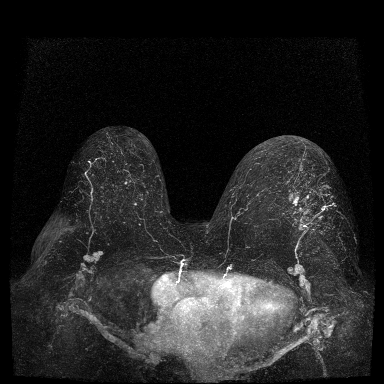

[Series 8: fl3d post-cm 3min · axial · 1.2mm · 0.99mm/px · z∈[-50,+121]mm · 6 of 144 slices shown]
[im 1/144]
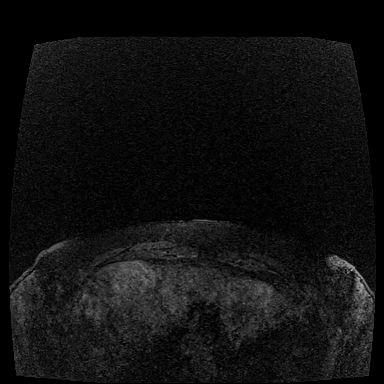
[im 29/144]
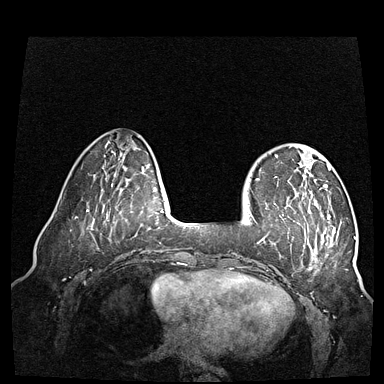
[im 58/144]
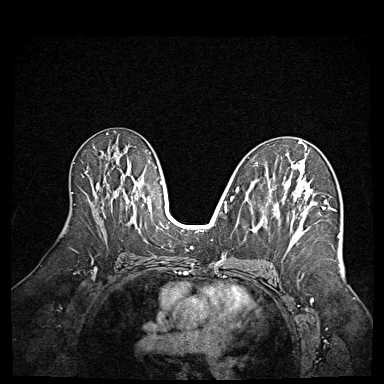
[im 86/144]
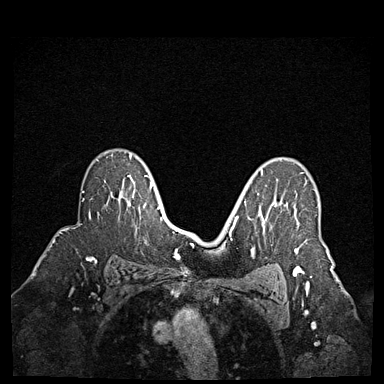
[im 115/144]
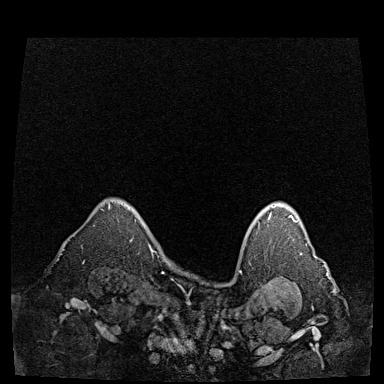
[im 144/144]
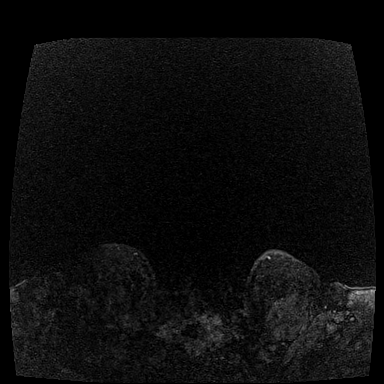

[Series 9: fl3d post-cm 3min_sub · axial · 1.2mm · 0.99mm/px · z∈[-50,+18]mm · 3 of 144 slices shown]
[im 1/144]
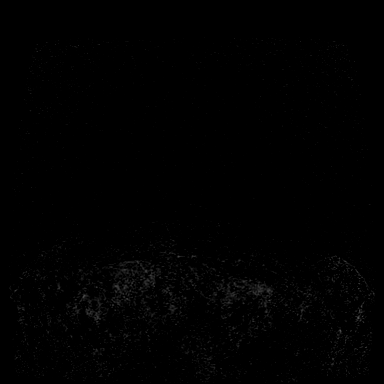
[im 29/144]
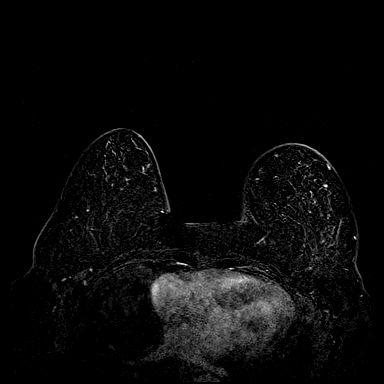
[im 58/144]
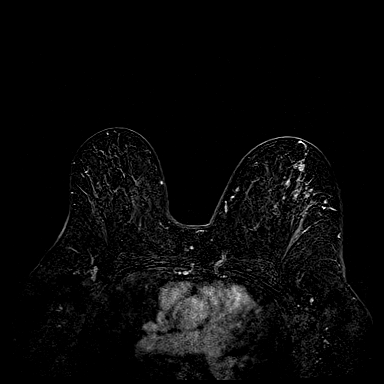

[31 of 48 positions shown; findings below may reference images not displayed]

Three-dimensional MR images were rendered by post-processing of the
original MR data on an independent workstation. The
three-dimensional MR images were interpreted, and findings are
reported in the following complete MRI report for this study. Three
dimensional images were evaluated at the independent interpreting
workstation using the [employer] thin client.
FINDINGS: Breast composition: c. Heterogeneous fibroglandular tissue.

Background parenchymal enhancement: Mild

Right breast: No suspicious enhancing mass, non-mass enhancement or
secondary signs of malignancy.

Left breast: Significant improvement of both the extent and strength
of the mass and non-mass enhancement throughout the outer LEFT
breast when compared to the earlier MRI of [DATE].

However, there remains scattered small masses within the outer to
central LEFT breast, involving the upper outer quadrant and lower
outer quadrant, largest measuring 9 mm (series 6, image 92).

There also remains scattered areas of non-mass enhancement
throughout the upper-outer quadrant and lower outer quadrant,
although again with less extent and less robust enhancement. These
areas of non-mass enhancement are as follows:

Lower outer quadrant, at posterior depth, measuring 4.4 cm extent
(series 6, image 106).

Upper outer quadrant, at middle depth, measuring 3.2 cm extent
(series 6, image 91).

Upper-outer quadrant, anterior to middle depth, measuring 2.7 cm
extent (series 6, image 75).

Lymph nodes: The 3 morphologically abnormal lymph nodes in the LEFT
axilla identified on previous MRI, 1 with a biopsy clip, are all
slightly smaller indicating response to treatment.

There are no new enlarged or morphologically abnormal lymph nodes
within the bilateral axillary or internal mammary chain regions.

Ancillary findings:  None.
IMPRESSION: 1. Evidence of a positive response to interval treatment. There has
been a significant improvement of both the extent and strength of
the enhancement throughout the outer LEFT breast when compared to
the earlier MRI of [DATE].
[DATE]. However, there remains scattered areas of mass and non-mass
enhancement throughout the outer LEFT breast, involving the upper
outer quadrant and lower outer quadrant, as detailed above.
3. The 3 morphologically abnormal lymph nodes in the LEFT axilla
identified on previous MRI, 1 a biopsy-proven metastasis, are all
slightly smaller indicating an additional positive response to
interval treatment.
4. No evidence of malignancy within the RIGHT breast.

RECOMMENDATION:
Per treatment plan for patient's known LEFT breast cancer and LEFT
axillary lymph node metastasis.

BI-RADS CATEGORY  6: Known biopsy-proven malignancy.

## 2020-12-04 MED ORDER — GADOBUTROL 1 MMOL/ML IV SOLN
10.0000 mL | Freq: Once | INTRAVENOUS | Status: AC | PRN
  Administered 2020-12-04: 10 mL via INTRAVENOUS

## 2020-12-07 ENCOUNTER — Encounter: Payer: Self-pay | Admitting: Physical Therapy

## 2020-12-07 ENCOUNTER — Encounter: Payer: Self-pay | Admitting: *Deleted

## 2020-12-07 ENCOUNTER — Ambulatory Visit: Payer: Medicare HMO | Attending: Hematology | Admitting: Physical Therapy

## 2020-12-07 ENCOUNTER — Other Ambulatory Visit: Payer: Self-pay

## 2020-12-07 DIAGNOSIS — R262 Difficulty in walking, not elsewhere classified: Secondary | ICD-10-CM

## 2020-12-07 DIAGNOSIS — M6281 Muscle weakness (generalized): Secondary | ICD-10-CM | POA: Diagnosis present

## 2020-12-07 DIAGNOSIS — R293 Abnormal posture: Secondary | ICD-10-CM | POA: Diagnosis present

## 2020-12-07 DIAGNOSIS — I890 Lymphedema, not elsewhere classified: Secondary | ICD-10-CM | POA: Diagnosis present

## 2020-12-07 DIAGNOSIS — M25611 Stiffness of right shoulder, not elsewhere classified: Secondary | ICD-10-CM | POA: Diagnosis present

## 2020-12-07 DIAGNOSIS — Z170 Estrogen receptor positive status [ER+]: Secondary | ICD-10-CM | POA: Insufficient documentation

## 2020-12-07 DIAGNOSIS — M25612 Stiffness of left shoulder, not elsewhere classified: Secondary | ICD-10-CM | POA: Insufficient documentation

## 2020-12-07 DIAGNOSIS — R296 Repeated falls: Secondary | ICD-10-CM | POA: Insufficient documentation

## 2020-12-07 DIAGNOSIS — C50412 Malignant neoplasm of upper-outer quadrant of left female breast: Secondary | ICD-10-CM

## 2020-12-07 NOTE — Progress Notes (Signed)
[place]   [phone] [phone]   Clinic Follow up Note   Patient Care Team: [name], PA-C as PCP - General (Internal Medicine) [name], RN as Oncology Nurse Navigator [name], RN as Oncology Nurse Navigator [name], MD as Consulting Physician (General Surgery) [name], MD as Consulting Physician (Hematology) [name], MD as Consulting Physician (Radiation Oncology) [name], [name], MD as Consulting Physician (Cardiology)  Date of Service:  12/10/2020  CHIEF COMPLAINT: F/u of left breast cancer  SUMMARY OF ONCOLOGIC HISTORY: Oncology History Overview Note  Cancer Staging Malignant neoplasm of upper-outer quadrant of left breast in female, estrogen receptor positive ([place]) Staging form: Breast, AJCC 8th Edition - Clinical stage from 07/16/2020: Stage IB (cT2, cN1, cM0, G2, ER+, PR+, HER2+) - Signed by [name], MD on 07/21/2020    Malignant neoplasm of upper-outer quadrant of left breast in female, estrogen receptor positive ([place])  07/01/2020 Mammogram   IMPRESSION: 1. There is a 12 cm span of highly suspicious calcifications involving the upper outer and upper inner quadrants of the left breast. On ultrasound, within this area of calcifications, there are confluent masses at 1 o'clock spanning at least 4.5 cm. An additional suspicious mass is seen in the left breast at 2 o'clock.  -Ultrasound targeted to the left breast at 1 o'clock, 5 cm from the nipple demonstrates a broad area of hypoechoic irregular tissue/confluent masses spanning at least 4.5 cm. There is a separate oval hypoechoic mass with indistinct margins at 2 o'clock, 7 cm from the nipple measuring 0.9 x 0.4 x 0.9 cm. Ultrasound of the left axilla demonstrates 2 abnormally thickened lymph nodes with cortices measuring 6-7 mm. There is an additional lymph node with a borderline cortex of 4 mm.   2. There are 2 lymph nodes  with thickened cortices in the left axilla, and an additional lymph node with a borderline thickened cortex.   07/16/2020 Cancer Staging   Staging form: Breast, AJCC 8th Edition - Clinical stage from 07/16/2020: Stage IIA (cT3, cN1, cM0, G2, ER+, PR+, HER2+) - Signed by [name], MD on 08/05/2020   07/16/2020 Initial Biopsy   Diagnosis 1. 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 Breast, left, needle core biopsy, 1 o'clock - INVASIVE DUCTAL CARCINOMA - SEE COMMENT 3. Lymph node, biopsy - METASTATIC CARCINOMA INVOLVING A LYMPH NODE - SEE COMMENT Microscopic Comment 1. Based on the biopsy, the ductal carcinoma in situ has a comedo pattern, high nuclear grade and measures 0.7 cm in greatest linear extent. Prognostic markers (ER/PR) are pending and will be reported in an addendum. 2. Based on the biopsy, the carcinoma appears Nottingham grade 2 of 3 and measures 1.5 cm in greatest linear extent. Prognostic markers (ER/PR/ki-67/HER2) are pending and will be reported in an addendum. Dr. Saralyn Pilar reviewed the case and agrees with the above diagnosis. These results were called to [facility] on July 17, 2020. 3. Based on the biopsy, the carcinoma appears measures 1.0 cm in greatest linear extent. Prognostic markers (ER/PR/ki-67/HER2) are pending and will be reported in an addendum.   07/16/2020 Receptors her2   1. PROGNOSTIC INDICATORS Results: IMMUNOHISTOCHEMICAL AND MORPHOMETRIC ANALYSIS PERFORMED MANUALLY Estrogen Receptor: 30%, POSITIVE, STRONG STAINING INTENSITY Progesterone Receptor: 5%, POSITIVE, STRONG STAINING INTENSITY  2. PROGNOSTIC INDICATORS Results: IMMUNOHISTOCHEMICAL AND MORPHOMETRIC ANALYSIS PERFORMED MANUALLY The tumor cells are POSITIVE for  Her2 (3+). Estrogen Receptor: 85%, POSITIVE, STRONG STAINING INTENSITY Progesterone Receptor:  60%, POSITIVE, STRONG STAINING INTENSITY Proliferation Marker Ki67: 25%  3. PROGNOSTIC INDICATORS Results: IMMUNOHISTOCHEMICAL AND MORPHOMETRIC ANALYSIS PERFORMED MANUALLY The tumor cells are POSITIVE for Her2 (3+). Estrogen Receptor: 95%, POSITIVE, STRONG STAINING INTENSITY Progesterone Receptor: 45%, POSITIVE, STRONG STAINING INTENSITY Proliferation Marker Ki67: 30%   07/20/2020 Initial Diagnosis   Malignant neoplasm of upper-outer quadrant of left breast in female, estrogen receptor positive ([place])   07/31/2020 Echocardiogram   Baseline Echo  IMPRESSIONS     1. Left ventricular ejection fraction, by estimation, is 65 to 70%. The  left ventricle has normal function. The left ventricle has no regional  wall motion abnormalities. Left ventricular diastolic parameters are  indeterminate. The average left  ventricular global longitudinal strain is -17.1 %.   2. Right ventricular systolic function is normal. The right ventricular  size is normal. There is normal pulmonary artery systolic pressure.   3. The mitral valve is normal in structure. Trivial mitral valve  regurgitation. No evidence of mitral stenosis.   4. The aortic valve is normal in structure. Aortic valve regurgitation is  not visualized. No aortic stenosis is present.    07/31/2020 Breast MRI   IMPRESSION: 1. Extensive non mass enhancement within the left breast extending from the nipple to the chest wall. Overall findings are most compatible with extensive malignancy involving the majority of the left breast. 2. Indeterminate enhancing mass within the upper inner right breast. 3. Three morphologically abnormal lymph nodes within the left axilla. One of these nodes has been biopsied compatible with metastatic adenopathy.     08/03/2020 Imaging   Bone Scan whole body  IMPRESSION: Increased radiotracer uptake in the mid to lower lumbar regions of uncertain etiology. Correlation with radiography to assess  for potential arthropathy in these areas advised. Increased uptake in major joints is likely of arthropathic etiology. Increased uptake in the mid face is likely due to paranasal sinus disease. Distribution of radiotracer uptake in bony structures elsewhere unremarkable.   08/04/2020 Procedure   PAC placement by Dr [name]   08/05/2020 - 10/28/2020 Neo-Adjuvant Chemotherapy   Neoadjuvant THP q3weeks for 4-6 cycles starting 08/05/20, followed by Herceptin or Kadcyla q3weeks to complete 1 year of treatment. Stopped THP after 1 dose due to poor toleration       -I switched her to [place] starting 08/25/20. Stopped after C2 due to poor toleration (10/28/20)   08/07/2020 Imaging   CT CAP  IMPRESSION: 2.7 cm mass in the upper outer left breast, likely corresponding to the patient's known primary breast neoplasm.   8 mm short axis left axillary node, suspicious for nodal metastasis in this clinical context.   No findings suspicious for distant metastasis.   Endometrium is mildly prominent, measuring 11 mm. Correlate for vaginal bleeding and consider pelvic ultrasound for further evaluation if clinically warranted.   08/11/2020 Pathology Results   Diagnosis Breast, right, needle core biopsy, right breast - PAPILLARY LESION - SEE COMMENT Microscopic Comment The biopsy consists of a papillary lesion with a focal area of adenosis. Immunohistochemistry (SMM, calponin and p63) shows retention of the myoepithelial cell layer in the focus of adenosis. 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 These results were called to [facility] on August 12, 2020.   10/27/2020 Imaging   CT Angio chest  IMPRESSION: Slightly suboptimal opacification of the main pulmonary artery, however no central or proximal segmental pulmonary embolism  Minimal bibasilar atelectasis   Aortic Atherosclerosis (ICD10-I70.0).   10/31/2020 Imaging   CT AP  IMPRESSION: 1.  No acute finding by CT other than a large amount of stool and gas in the right colon and gas in the transverse colon. No small bowel obstruction. 2. Previous cholecystectomy. 3. Aortic atherosclerosis. 4. Umbilical hernia containing only fat. 5. Small amount of fluid in the endometrial canal as seen previously.   Aortic Atherosclerosis (ICD10-I70.0).   12/04/2020 Imaging   MRI Breast IMPRESSION: 1. Evidence of a positive response to interval treatment. There has been a significant improvement of both the extent and strength of the enhancement throughout the outer LEFT breast when compared to the earlier MRI of 07/31/2020. 2. However, there remains scattered areas of mass and non-mass enhancement throughout the outer LEFT breast, involving the upper outer quadrant and lower outer quadrant, as detailed above. 3. The 3 morphologically abnormal lymph nodes in the LEFT axilla identified on previous MRI, 1 a biopsy-proven metastasis, are all slightly smaller indicating an additional positive response to interval treatment. 4. No evidence of malignancy within the RIGHT breast.   12/10/2020 -  Chemotherapy    Patient is on Treatment Plan: BREAST TRASTUZUMAB Q21D         CURRENT THERAPY:  -PENDING antiestrogen and Herceptin therapy.  -Symptoms Management  INTERVAL HISTORY:  Cheryl Blankenship is here for a follow up. She presents to the clinic alone. She comes in a wheelchair  She complains numbness in her arms, she uses 4-5 pillows at night.  She also has numberness at toes, able to walk with walker. No chest pain or other new symptoms  Eating better, ocaasional nausea and diarrhea, and sometime constipated    All other systems were reviewed with the patient and are negative.  MEDICAL HISTORY:  Past Medical History:  Diagnosis Date  . Anxiety   . Breast cancer ([place])   . CHF (congestive heart failure) ([place])   . Diabetes mellitus without complication ([place])    type 2  .  Hypertension     SURGICAL HISTORY: Past Surgical History:  Procedure Laterality Date  . APPENDECTOMY    . CHOLECYSTECTOMY    . COLONOSCOPY    . ESOPHAGOGASTRODUODENOSCOPY (EGD) WITH PROPOFOL N/A 11/01/2020   Procedure: ESOPHAGOGASTRODUODENOSCOPY (EGD) WITH PROPOFOL;  Surgeon: Arta Silence, MD;  Location: [facility];  Service: Endoscopy;  Laterality: N/A;  . EYE SURGERY     cataracts  . MULTIPLE TOOTH EXTRACTIONS    . PORTACATH PLACEMENT N/A 08/04/2020   Procedure: INSERTION PORT-A-CATH WITH ULTRASOUND GUIDANCE;  Surgeon: [name], MD;  Location: [facility];  Service: General;  Laterality: N/A;  . STOMACH SURGERY      I have reviewed the social history and family history with the patient and they are unchanged from previous note.  ALLERGIES:  is allergic to coconut (cocos nucifera) allergy skin test, hydrochlorothiazide, lisinopril, and coconut oil.  MEDICATIONS:  Current Outpatient Medications  Medication Sig Dispense Refill  . exemestane (AROMASIN) 25 MG tablet Take 1 tablet (25 mg total) by mouth daily after breakfast. 30 tablet 2  . Accu-Chek FastClix Lancets MISC     . albuterol (VENTOLIN HFA) 108 (90 Base) MCG/ACT inhaler Inhale 1-2 puffs into the lungs every 4 (four) hours as needed for wheezing or shortness of breath. 8 g 0  . ALPRAZolam (XANAX) 0.25 MG tablet Take 1-2 tablets (0.25-0.5 mg total) by mouth 3 (three) times daily as needed for anxiety or sleep. 30 tablet 0  .  amLODipine (NORVASC) 10 MG tablet Take 1 tablet (10 mg total) by mouth daily. 30 tablet 10  . aspirin 81 MG chewable tablet Chew 81 mg by mouth daily.     . bisacodyl (DULCOLAX) 5 MG EC tablet Take 1 tablet (5 mg total) by mouth daily as needed for moderate constipation. 30 tablet 0  . carvedilol (COREG) 25 MG tablet Take 1 tablet (25 mg total) by mouth 2 (two) times daily with a meal. 180 tablet 3  . Cholecalciferol 25 MCG (1000 UT) capsule Take 1,000 Units by mouth daily.    . DULoxetine (CYMBALTA)  30 MG capsule Take 2 capsules (60 mg total) by mouth daily. 60 capsule 0  . folic acid (FOLVITE) 1 MG tablet Take 1 mg by mouth daily.    Marland Kitchen gabapentin (NEURONTIN) 400 MG capsule Take 400 mg by mouth 3 (three) times daily.     Marland Kitchen glipiZIDE (GLUCOTROL XL) 10 MG 24 hr tablet Take 10 mg by mouth daily with breakfast.     . HYDROcodone-acetaminophen (NORCO/VICODIN) 5-325 MG tablet Take 1 tablet by mouth every 6 (six) hours as needed for up to 15 doses for moderate pain. 15 tablet 0  . insulin aspart (NOVOLOG) 100 UNIT/ML injection Inject 2-10 Units into the skin 3 (three) times daily before meals. Per sliding scale:  150-199 = 2 units, 200-249 = 4 units, 250-299 = 6 units, 300-349 = 8 units, greater than350 = 10 units.    . insulin glargine (LANTUS SOLOSTAR) 100 UNIT/ML Solostar Pen Inject 35 Units into the skin 2 (two) times daily. (Patient taking differently: Inject 35 Units into the skin 2 (two) times daily. Pt takes 70 units in the morning and 70 units at night.) 15 mL 11  . lactulose (CHRONULAC) 10 GM/15ML solution Take 45 mLs (30 g total) by mouth every 4 (four) hours as needed for mild constipation. 236 mL 0  . lidocaine-prilocaine (EMLA) cream APPLY TO AFFECTED AREA 1 time DAILY (Patient taking differently: Apply 1 application topically daily as needed (pain).) 30 g 3  . losartan (COZAAR) 100 MG tablet Take 1 tablet (100 mg total) by mouth daily. 90 tablet 1  . metFORMIN (GLUCOPHAGE) 500 MG tablet Take 500 mg by mouth 2 (two) times daily with a meal.     . morphine (MSIR) 15 MG tablet Take 1 tablet (15 mg total) by mouth every 4 (four) hours as needed for severe pain. 10 tablet 0  . Nutritional Supplements (FEEDING SUPPLEMENT, GLUCERNA 1.2 CAL,) LIQD Take 237 mLs by mouth 3 (three) times daily with meals.    . Omega-3 Fatty Acids (FISH OIL) 1000 MG CAPS Take 1,000 mg by mouth daily.    Marland Kitchen omeprazole (PRILOSEC) 20 MG capsule Take 1 capsule (20 mg total) by mouth daily. 30 capsule 11  . ondansetron  (ZOFRAN-ODT) 4 MG disintegrating tablet Take 1 tablet (4 mg total) by mouth 2 (two) times daily as needed for up to 30 doses for nausea or vomiting. 30 tablet 0  . prochlorperazine (COMPAZINE) 10 MG tablet Take 1 tablet (10 mg total) by mouth every 6 (six) hours as needed for nausea or vomiting. 30 tablet 1  . promethazine (PHENERGAN) 25 MG suppository Place 1 suppository (25 mg total) rectally every 8 (eight) hours as needed for nausea or vomiting. 30 each 1  . RESTASIS 0.05 % ophthalmic emulsion Place 1 drop into both eyes 2 (two) times daily.    . rosuvastatin (CRESTOR) 5 MG tablet Take 5 mg by  mouth at bedtime.    Marland Kitchen spironolactone (ALDACTONE) 25 MG tablet Take 1 tablet (25 mg total) by mouth daily. 90 tablet 1  . sucralfate (CARAFATE) 1 GM/10ML suspension Take 10 mLs (1 g total) by mouth 4 (four) times daily -  with meals and at bedtime. 420 mL 0  . torsemide (DEMADEX) 20 MG tablet Take 2 tablets (40 mg total) by mouth daily. 180 tablet 3  . zolpidem (AMBIEN) 5 MG tablet Take 1 tablet (5 mg total) by mouth at bedtime as needed for sleep. 20 tablet 0   No current facility-administered medications for this visit.    PHYSICAL EXAMINATION: ECOG PERFORMANCE STATUS: 3 - Symptomatic, >50% confined to bed  Vitals:   12/10/20 1133  BP: (!) 144/70  Pulse: 65  Resp: 17  Temp: (!) 97 F (36.1 C)  SpO2: 97%   Filed Weights   12/10/20 1133  Weight: 209 lb 1.6 oz (94.8 kg)    GENERAL:alert, no distress and comfortable SKIN: skin color, texture, turgor are normal, no rashes or significant lesions EYES: normal, Conjunctiva are pink and non-injected, sclera clear NECK: supple, thyroid normal size, non-tender, without nodularity LYMPH:  no palpable lymphadenopathy in the cervical, axillary  LUNGS: clear to auscultation and percussion with normal breathing effort HEART: regular rate & rhythm and no murmurs and no lower extremity edema ABDOMEN:abdomen soft, non-tender and normal bowel  sounds Musculoskeletal:no cyanosis of digits and no clubbing  NEURO: alert & oriented x 3 with fluent speech, no focal motor/sensory deficits  LABORATORY DATA:  I have reviewed the data as listed CBC Latest Ref Rng & Units 12/10/2020 11/03/2020 11/02/2020  WBC 4.0 - 10.5 K/uL 7.4 7.8 6.5  Hemoglobin 12.0 - 15.0 g/dL 11.3(L) 12.0 12.4  Hematocrit 36.0 - 46.0 % 34.3(L) 36.6 38.2  Platelets 150 - 400 K/uL 271 204 192     CMP Latest Ref Rng & Units 12/10/2020 11/03/2020 11/02/2020  Glucose 70 - 99 mg/dL 245(H) 160(H) 109(H)  BUN 8 - 23 mg/dL 25(H) 28(H) 32(H)  Creatinine 0.44 - 1.00 mg/dL 1.15(H) 1.22(H) 1.49(H)  Sodium 135 - 145 mmol/L 139 136 135  Potassium 3.5 - 5.1 mmol/L 3.9 3.3(L) 3.2(L)  Chloride 98 - 111 mmol/L 99 101 96(L)  CO2 22 - 32 mmol/L 31 27 29   Calcium 8.9 - 10.3 mg/dL 10.6(H) 9.8 10.1  Total Protein 6.5 - 8.1 g/dL 7.2 - 6.9  Total Bilirubin 0.3 - 1.2 mg/dL 0.3 - 0.7  Alkaline Phos 38 - 126 U/L 62 - 51  AST 15 - 41 U/L 15 - 37  ALT 0 - 44 U/L 15 - 42      RADIOGRAPHIC STUDIES: I have personally reviewed the radiological images as listed and agreed with the findings in the report. No results found.   ASSESSMENT & PLAN:  Totiana Everson is a 70 y.o. female with    1.Malignant neoplasm of upper-outer quadrant of left breast, StageII, c(T2N1M0), ER+/PR+/HER2+, GradeII -She was diagnosed in 06/2020.She has 12cm calcification and 4.5cm mass in her left breast with2 abnormalLN. 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 -HerMRI Breast from 07/31/20 shows very extensive disease in her left breast and 3 abnormal LNs in the left axilla. There isindeterminate enhancing82mmass within the upper inner right breast. -Her 08/11/20 right breast biopsy showed benign papillary lesion. -Herbone scan from 08/03/20 shows uptake in her lumbar spine, but nonspecific. She has known  lumbar back  pain from arthritis. Her CT CAP from 08/07/20 showed known breast masses and left axillary LN andnodistant metastasis. She has mild thickening of endometrium, pt denies vaginal bleeding. -Given theextensivedisease in her left breast,shewillneedmastectomy with Dr Cindra Eves the only way to cure her cancer. -She startedneoadjuvantchemoTHPq3weekson11/17/21. Due to poor toleration after C1, she was switched to [employer] on 09/04/20. C2 postponed due to COVID (+) in early 09/2020. Due to poor toleration of C2 Kadcyla, chemo held since cycle 2.  -Given poor toleration, will proceed with surgery sooner. We discussed her breast MRI from 12/04/20 which showed good partial response.  -She is scheduled to see Dr. Johnny Bridge next Monday -I recommend Herceptin every 3 weeks and exemestane 29m daily to reduce her risk of recurrence after surgery.  I again reviewed the potential benefit and side effects with patient and her daughter (on the phone) today, she agrees to proceed. -She is due for echocardiogram, scheduled to see her cardiologist next week. -Follow-up in 3 weeks with treatment.  2. Diffuse diabetic neuropathy  -She has pre-existing neuropathy, was on Neurontin and Cymbalta before cancer diagnosis, got worse after chemo  -She will continue Cymbalta and Neurontin -Physical therapy referral due to her recent deconditioning   3. Comorbidities: CHF,poorly controlledDM with neuropathy, HTN, Arthritis in hips -Due to Nausea and stomach pain fromJardiance, she takes Zofran as needed.  -Continue medications and f/u with PCP and Cardiologist Dr. OMarisue Ivan  -She had 2 falls in 2021due to multiple medications and poor sleep. She ambulates with cane.  -Pt is on Lasix which she notes leads to urinary incontinence. She wears depends for this. -Her baseline ECHO from 07/31/20 was normal. Will monitor on Herceptin. -She will continue to f/u with Cardiologist.  4. Genetic  testing, she agreed to proceed but has not proceeded with testing yet.  5. Social Support -She does have decreased memory, she is on medication. Her family help her with history. -[place]. Sheis currently able to take care of herself independently. Her daughter [place] in [place] and [place] She has other non-biological children.She lives with her husband    633 COVID (+) in early 09/2020.She was asymptomatic and did not require treatment. She tested negative in later 09/2020. She has recovered well   PLAN: -breast MRI findings reviewed  -f/u with Dr. TGeorgette Dovernext Monday to schedule breast surgery  -proceed with trastuzumab today, and continue every 3 weeks -I called in exemestane 25 mg daily today, she will start within the next week -Lab and follow-up and Trastuzumab in 3 weeks -I sent a message to her cardiologist for repeated echo when she see him next week  No problem-specific Assessment & Plan notes found for this encounter.   No orders of the defined types were placed in this encounter.  All questions were answered. The patient knows to call the clinic with any problems, questions or concerns. No barriers to learning was detected. The total time spent in the appointment was 30 minutes.     YTruitt Merle MD 12/10/2020   I, AJoslyn Devon am acting as scribe for YTruitt Merle MD.   I have reviewed the above documentation for accuracy and completeness, and I agree with the above.

## 2020-12-07 NOTE — Patient Instructions (Signed)

## 2020-12-07 NOTE — Therapy (Signed)
[place], [place], [address] Phone: [phone]   Fax:  [phone]  Physical Therapy Treatment  Patient Details  Name: Cheryl Blankenship MRN: [id number] Date of Birth: June 30, 1951 Referring Provider (PT): Burr Medico   Encounter Date: 12/07/2020   PT End of Session - 12/07/20 1717    Visit Number 2    Number of Visits 18    Date for PT Re-Evaluation 02/01/21    PT Start Time 1603    PT Stop Time 1700    PT Time Calculation (min) 57 min    Activity Tolerance Patient tolerated treatment well    Behavior During Therapy [facility] for tasks assessed/performed           Past Medical History:  Diagnosis Date  . Anxiety   . Breast cancer ([place])   . CHF (congestive heart failure) ([place])   . Diabetes mellitus without complication ([place])    type 2  . Hypertension     Past Surgical History:  Procedure Laterality Date  . APPENDECTOMY    . CHOLECYSTECTOMY    . COLONOSCOPY    . ESOPHAGOGASTRODUODENOSCOPY (EGD) WITH PROPOFOL N/A 11/01/2020   Procedure: ESOPHAGOGASTRODUODENOSCOPY (EGD) WITH PROPOFOL;  Surgeon: Arta Silence, MD;  Location: [facility];  Service: Endoscopy;  Laterality: N/A;  . EYE SURGERY     cataracts  . MULTIPLE TOOTH EXTRACTIONS    . PORTACATH PLACEMENT N/A 08/04/2020   Procedure: INSERTION PORT-A-CATH WITH ULTRASOUND GUIDANCE;  Surgeon: [name], MD;  Location: [facility];  Service: General;  Laterality: N/A;  . STOMACH SURGERY      There were no vitals filed for this visit.   Subjective Assessment - 12/07/20 1606    Subjective I am frustrated because I can not do the things I used to be able to do. Pt had to stop chemotherapy early due to trouble tolerating it. Plan is to undergo surgery at some point.    Pertinent History Patient was diagnosed on 05/20/2020 with left grade II invasive ductal carcinoma breast cancer. It measures 12 cm and is mostly located in the upper outer quadrant. It is  ER/PR positive and HER2 negative with a Ki67 of 25%. She has a hx of a heart attack, stroke, and heart failure and diabetes. She is very lmied functionally and currently walks with a cane;    Patient Stated Goals to be more mobile, would like to be able to cook more and grocery shop, have more energy to complete her ADLs    Currently in Pain? Yes    Pain Score 10-Worst pain ever    Pain Location Foot   feet and legs   Pain Orientation Right;Left    Pain Descriptors / Indicators Numbness    Pain Type Neuropathic pain    Pain Onset More than a month ago    Pain Frequency Constant    Aggravating Factors  start to move around    Pain Relieving Factors laying down    Effect of Pain on Daily Activities unable to stand to cook              [facility] PT Assessment - 12/07/20 0001      Assessment   Referring Provider (PT) [place]   Has the patient fallen in the past 6 months Yes    How many times? 4 or 5   couple of times pt reports it was from the chemo when going upstairs carrying  food, then fell going down the stairs holding on to the rail and the rail broke   Has the patient had a decrease in activity level because of a fear of falling?  Yes    Is the patient reluctant to leave their home because of a fear of falling?  No      Home Environment   Living Environment Private residence    Living Arrangements Children;Spouse/significant other    Available Help at Discharge Family    Type of Home Apartment    Home Access Stairs to enter    Entrance Stairs-Number of Steps 3    Entrance Stairs-Rails Right    Home Layout One level      Prior Function   Level of Independence Requires assistive device for independence;Needs assistance with ADLs;Needs assistance with homemaking   very fearful of falling   Vocation On disability    Leisure she reports she tries to walk in her home      Cognition   Overall Cognitive Status Within Functional Limits for tasks assessed       Observation/Other Assessments   Observations L UE more swollen in upper arm      Functional Tests   Functional tests Sit to Stand      Sit to Stand   Comments 30 sec sit to stand: 3 x in 30 secs with use of both UEs for support      Posture/Postural Control   Posture/Postural Control Postural limitations    Postural Limitations Rounded Shoulders;Forward head;Increased thoracic kyphosis      AROM   Overall AROM Comments Bilateral shoulder ROM is limited      Strength   Right Hip Flexion 2+/5    Right Hip ABduction 3+/5    Left Hip Flexion 2+/5    Left Hip ABduction 3+/5    Right Knee Flexion 3/5    Right Knee Extension 3/5    Left Knee Flexion 3/5    Left Knee Extension 3/5    Right Ankle Dorsiflexion 3/5    Left Ankle Dorsiflexion 3/5      Transfers   Transfers Sit to Stand    Comments pt has to rock back and forth and push off with arms      Ambulation/Gait   Ambulation/Gait Yes    Ambulation/Gait Assistance 6: Modified independent (Device/Increase time)    Ambulation Distance (Feet) 5 Feet    Assistive device Straight cane    Gait Pattern Step-to pattern;Decreased arm swing - right;Decreased arm swing - left;Decreased step length - right;Decreased step length - left;Decreased dorsiflexion - right;Decreased dorsiflexion - left;Trunk flexed;Wide base of support;Decreased trunk rotation    Ambulation Surface Level                                 PT Education - 12/07/20 1703    Education Details importance of a walking program, decreasing weight of purse to help with balance, how to stand from a chair while holding cane and pushing off with cane    Person(s) Educated Patient    Methods Explanation;Handout    Comprehension Verbalized understanding            PT Short Term Goals - 12/07/20 1703      PT SHORT TERM GOAL #1   Title Pt will be able to sit to stand from a chair 1x without using hands    Baseline unable      Time 4    Period Weeks     Status New    Target Date 01/04/21      PT SHORT TERM GOAL #2   Title Pt will be able to ambulate with a step through gait pattern to improve walking speed    Baseline step to gait    Time 4    Period Weeks    Status New    Target Date 01/04/21      PT SHORT TERM GOAL #3   Title Pt will be indpendent in bed mobility to decrease reliance on caregivers    Baseline pt reports she needs assist for this    Time 4    Period Weeks    Status New    Target Date 01/04/21      PT SHORT TERM GOAL #4   Title Pt will be independent in a home exercise program for LE strengthening to decrease fall risk    Time 4    Period Weeks    Status New    Target Date 01/04/21             PT Long Term Goals - 12/07/20 1714      PT LONG TERM GOAL #1   Title Patient will demonstrate she has regained shoulder ROM to be function and equal to or greater than her right arm post operatively.    Time 6    Period Months    Status New      PT LONG TERM GOAL #2   Title Pt will demonstrate 4/5 bilateral hip flexor strength to decrease fall risk    Time 8    Period Weeks    Status New    Target Date 02/01/21      PT LONG TERM GOAL #3   Title Pt will demonstrate 4/5 ankle dorsiflexion strength to decrease fall risk    Time 8    Period Weeks    Status New    Target Date 02/01/21      PT LONG TERM GOAL #4   Title Pt will be able to sit to stand from a chair without use of UEs 8x in 30 sec to decrease fall risk.    Time 8    Period Weeks    Status New    Target Date 02/01/21      PT LONG TERM GOAL #5   Title Pt will be able to ambulate with step through gait pattern using least restrictive device and demonstrate correct use of device during ambulation    Time 8    Period Weeks    Status New    Target Date 02/01/21                 Plan - 12/07/20 1717    Clinical Impression Statement Pt is undergoing treatment for left grade II invasive ductal carcinoma breast cancer. It measured 12  cm. Is ER/PR/HER2+. Pt had to stop chemo early due to decreased tolerance. Pt reports in the 5 months she has been on chemo she has lost her strength and has difficulty with mobility. Pt requires an assistive device for ambulation (using straight cane today but uses rollator at home) and ambulates with step to gait pattern with wide base of support, forward flexed trunk and decreased foot cleareance. Pt's overall bilateral LE strength is 2+/5 to 3/5. She is unable to get dressed, complete ADLs or cook without assistance. Pt reports it takes her increased time to complete all   tasks. She has a history of repeated falls in the past 6 months. She also has neck and shoulder spasms and decreased bilateral shoulder ROM (visualized but not measured today due to time constraints). Pt would benefit from skilled PT services to increase bilateral LE strength, increase independence with mobility, and instruct pt in a home exercise program.    Comorbidities Hx heart attack, CVA, CHF; multiple recent falls    Rehab Potential Fair    PT Frequency 2x / week    PT Duration 8 weeks    PT Treatment/Interventions ADLs/Self Care Home Management;Therapeutic exercise;Patient/family education;Gait training;Stair training;Functional mobility training;Therapeutic activities;Neuromuscular re-education;Balance training;Manual techniques;Manual lymph drainage;Passive range of motion    PT Next Visit Plan measure circumferences, measure shoulder ROM - add goals, begin supine strengthening for all LEs and work on bed mobility    Consulted and Agree with Plan of Care Patient           Patient will benefit from skilled therapeutic intervention in order to improve the following deficits and impairments:  Postural dysfunction,Decreased range of motion,Decreased knowledge of precautions,Impaired UE functional use,Pain,Decreased balance,Decreased activity tolerance,Increased edema,Abnormal gait,Decreased mobility,Decreased  endurance,Difficulty walking,Increased muscle spasms,Decreased strength,Increased fascial restricitons  Visit Diagnosis: Difficulty in walking, not elsewhere classified - Plan: PT plan of care cert/re-cert  Muscle weakness (generalized) - Plan: PT plan of care cert/re-cert  Stiffness of left shoulder, not elsewhere classified - Plan: PT plan of care cert/re-cert  Stiffness of right shoulder, not elsewhere classified - Plan: PT plan of care cert/re-cert  Lymphedema, not elsewhere classified - Plan: PT plan of care cert/re-cert  Repeated falls - Plan: PT plan of care cert/re-cert  Abnormal posture - Plan: PT plan of care cert/re-cert  Malignant neoplasm of upper-outer quadrant of left breast in female, estrogen receptor positive ([place]) - Plan: PT plan of care cert/re-cert     Problem List Patient Active Problem List   Diagnosis Date Noted  . Chest pain 10/30/2020  . Constipation 10/30/2020  . Type 2 diabetes mellitus without complication ([place]) 83/38/2505  . Exertional dyspnea 08/26/2020  . Essential hypertension 08/26/2020  . Acute diastolic CHF (congestive heart failure) ([place]) 08/26/2020  . Obesity, diabetes, and hypertension syndrome ([place]) 08/26/2020  . Malignant neoplasm of upper-outer quadrant of left breast in female, estrogen receptor positive ([place]) 07/20/2020    Allyson Sabal [facility] 12/07/2020, 5:33 PM  [place], [place], [address] Phone: [phone]   Fax:  [phone]  Name: Cheryl Blankenship MRN: [id number] Date of Birth: [birth year]  Manus Gunning, PT 12/07/20 5:33 PM

## 2020-12-10 ENCOUNTER — Other Ambulatory Visit: Payer: Medicare HMO

## 2020-12-10 ENCOUNTER — Other Ambulatory Visit: Payer: Self-pay

## 2020-12-10 ENCOUNTER — Inpatient Hospital Stay: Payer: Medicare HMO

## 2020-12-10 ENCOUNTER — Inpatient Hospital Stay (HOSPITAL_BASED_OUTPATIENT_CLINIC_OR_DEPARTMENT_OTHER): Payer: Medicare HMO | Admitting: Hematology

## 2020-12-10 ENCOUNTER — Ambulatory Visit: Payer: Medicare HMO

## 2020-12-10 ENCOUNTER — Encounter: Payer: Self-pay | Admitting: Hematology

## 2020-12-10 ENCOUNTER — Ambulatory Visit: Payer: Medicare HMO | Admitting: Hematology

## 2020-12-10 VITALS — BP 144/70 | HR 65 | Temp 97.0°F | Resp 17 | Ht 63.0 in | Wt 209.1 lb

## 2020-12-10 DIAGNOSIS — C50412 Malignant neoplasm of upper-outer quadrant of left female breast: Secondary | ICD-10-CM | POA: Diagnosis not present

## 2020-12-10 DIAGNOSIS — E119 Type 2 diabetes mellitus without complications: Secondary | ICD-10-CM

## 2020-12-10 DIAGNOSIS — Z5112 Encounter for antineoplastic immunotherapy: Secondary | ICD-10-CM | POA: Diagnosis not present

## 2020-12-10 DIAGNOSIS — Z170 Estrogen receptor positive status [ER+]: Secondary | ICD-10-CM | POA: Diagnosis not present

## 2020-12-10 DIAGNOSIS — I10 Essential (primary) hypertension: Secondary | ICD-10-CM

## 2020-12-10 LAB — CMP (CANCER CENTER ONLY)
ALT: 15 U/L (ref 0–44)
AST: 15 U/L (ref 15–41)
Albumin: 3.6 g/dL (ref 3.5–5.0)
Alkaline Phosphatase: 62 U/L (ref 38–126)
Anion gap: 9 (ref 5–15)
BUN: 25 mg/dL — ABNORMAL HIGH (ref 8–23)
CO2: 31 mmol/L (ref 22–32)
Calcium: 10.6 mg/dL — ABNORMAL HIGH (ref 8.9–10.3)
Chloride: 99 mmol/L (ref 98–111)
Creatinine: 1.15 mg/dL — ABNORMAL HIGH (ref 0.44–1.00)
GFR, Estimated: 52 mL/min — ABNORMAL LOW (ref >60)
Glucose, Bld: 245 mg/dL — ABNORMAL HIGH (ref 70–99)
Potassium: 3.9 mmol/L (ref 3.5–5.1)
Sodium: 139 mmol/L (ref 135–145)
Total Bilirubin: 0.3 mg/dL (ref 0.3–1.2)
Total Protein: 7.2 g/dL (ref 6.5–8.1)

## 2020-12-10 LAB — CBC WITH DIFFERENTIAL (CANCER CENTER ONLY)
Abs Immature Granulocytes: 0.02 10*3/uL (ref 0.00–0.07)
Basophils Absolute: 0 10*3/uL (ref 0.0–0.1)
Basophils Relative: 0 %
Eosinophils Absolute: 0.2 10*3/uL (ref 0.0–0.5)
Eosinophils Relative: 3 %
HCT: 34.3 % — ABNORMAL LOW (ref 36.0–46.0)
Hemoglobin: 11.3 g/dL — ABNORMAL LOW (ref 12.0–15.0)
Immature Granulocytes: 0 %
Lymphocytes Relative: 42 %
Lymphs Abs: 3.1 10*3/uL (ref 0.7–4.0)
MCH: 28 pg (ref 26.0–34.0)
MCHC: 32.9 g/dL (ref 30.0–36.0)
MCV: 85.1 fL (ref 80.0–100.0)
Monocytes Absolute: 0.4 10*3/uL (ref 0.1–1.0)
Monocytes Relative: 6 %
Neutro Abs: 3.6 10*3/uL (ref 1.7–7.7)
Neutrophils Relative %: 49 %
Platelet Count: 271 10*3/uL (ref 150–400)
RBC: 4.03 MIL/uL (ref 3.87–5.11)
RDW: 13.2 % (ref 11.5–15.5)
WBC Count: 7.4 10*3/uL (ref 4.0–10.5)
nRBC: 0 % (ref 0.0–0.2)

## 2020-12-10 MED ORDER — SODIUM CHLORIDE 0.9 % IV SOLN
Freq: Once | INTRAVENOUS | Status: AC
  Filled 2020-12-10: qty 250

## 2020-12-10 MED ORDER — HEPARIN SOD (PORK) LOCK FLUSH 100 UNIT/ML IV SOLN
500.0000 [IU] | Freq: Once | INTRAVENOUS | Status: AC | PRN
  Administered 2020-12-10: 500 [IU]
  Filled 2020-12-10: qty 5

## 2020-12-10 MED ORDER — TRASTUZUMAB-ANNS CHEMO 150 MG IV SOLR
750.0000 mg | Freq: Once | INTRAVENOUS | Status: AC
  Administered 2020-12-10: 750 mg via INTRAVENOUS
  Filled 2020-12-10: qty 35.72

## 2020-12-10 MED ORDER — ACETAMINOPHEN 325 MG PO TABS
650.0000 mg | ORAL_TABLET | Freq: Once | ORAL | Status: AC
  Administered 2020-12-10: 650 mg via ORAL

## 2020-12-10 MED ORDER — ACETAMINOPHEN 325 MG PO TABS
ORAL_TABLET | ORAL | Status: AC
  Filled 2020-12-10: qty 2

## 2020-12-10 MED ORDER — SODIUM CHLORIDE 0.9% FLUSH
10.0000 mL | INTRAVENOUS | Status: DC | PRN
  Administered 2020-12-10: 10 mL
  Filled 2020-12-10: qty 10

## 2020-12-10 MED ORDER — DIPHENHYDRAMINE HCL 25 MG PO CAPS
ORAL_CAPSULE | ORAL | Status: AC
  Filled 2020-12-10: qty 2

## 2020-12-10 MED ORDER — DIPHENHYDRAMINE HCL 25 MG PO CAPS
50.0000 mg | ORAL_CAPSULE | Freq: Once | ORAL | Status: AC
  Administered 2020-12-10: 50 mg via ORAL

## 2020-12-10 MED ORDER — EXEMESTANE 25 MG PO TABS: 25.0000 mg | ORAL_TABLET | Freq: Every day | ORAL | 2 refills | Status: DC

## 2020-12-10 NOTE — Progress Notes (Signed)
Per Dr. Burr Medico OK to treat today w/ ECHO from 08/09/20

## 2020-12-10 NOTE — Progress Notes (Signed)
Confirmed dose of trastuzumab today at 8 mg/kg per Dr Burr Medico.  Ok to proceed as she is followed cardiologist.  T.O. Dr Lavonda Jumbo, PharmD 12/10/20 @ [phone]

## 2020-12-10 NOTE — Patient Instructions (Signed)
[facility] Discharge Instructions   Today you received the following immunotherapy agents Trastuzumab  To help prevent nausea and vomiting after your treatment, we encourage you to take your nausea medication as directed. If you develop nausea and vomiting that is not controlled by your nausea medication, call the clinic.   BELOW ARE SYMPTOMS THAT SHOULD BE REPORTED IMMEDIATELY:  *FEVER GREATER THAN 100.5 F  *CHILLS WITH OR WITHOUT FEVER  NAUSEA AND VOMITING THAT IS NOT CONTROLLED WITH YOUR NAUSEA MEDICATION  *UNUSUAL SHORTNESS OF BREATH  *UNUSUAL BRUISING OR BLEEDING  TENDERNESS IN MOUTH AND THROAT WITH OR WITHOUT PRESENCE OF ULCERS  *URINARY PROBLEMS  *BOWEL PROBLEMS  UNUSUAL RASH Items with * indicate a potential emergency and should be followed up as soon as possible.  Feel free to call the clinic should you have any questions or concerns. The clinic phone number is [phone].  Please show the [facility] [place] at check-in to the Emergency Department and triage nurse.    Trastuzumab injection for infusion What is this medicine? TRASTUZUMAB (tras TOO zoo mab) is a monoclonal antibody. It is used to treat breast cancer and stomach cancer. This medicine may be used for other purposes; ask your health care provider or pharmacist if you have questions. COMMON BRAND NAME(S): Herceptin, Galvin Proffer, Trazimera What should I tell my health care provider before I take this medicine? They need to know if you have any of these conditions:  heart disease  heart failure  lung or breathing disease, like asthma  an unusual or allergic reaction to trastuzumab, benzyl alcohol, or other medications, foods, dyes, or preservatives  pregnant or trying to get pregnant  breast-feeding How should I use this medicine? This drug is given as an infusion into a vein. It is administered in a hospital or clinic by a specially trained health care  professional. Talk to your pediatrician regarding the use of this medicine in children. This medicine is not approved for use in children. Overdosage: If you think you have taken too much of this medicine contact a poison control center or emergency room at once. NOTE: This medicine is only for you. Do not share this medicine with others. What if I miss a dose? It is important not to miss a dose. Call your doctor or health care professional if you are unable to keep an appointment. What may interact with this medicine? This medicine may interact with the following medications:  certain types of chemotherapy, such as daunorubicin, doxorubicin, epirubicin, and idarubicin This list may not describe all possible interactions. Give your health care provider a list of all the medicines, herbs, non-prescription drugs, or dietary supplements you use. Also tell them if you smoke, drink alcohol, or use illegal drugs. Some items may interact with your medicine. What should I watch for while using this medicine? Visit your doctor for checks on your progress. Report any side effects. Continue your course of treatment even though you feel ill unless your doctor tells you to stop. Call your doctor or health care professional for advice if you get a fever, chills or sore throat, or other symptoms of a cold or flu. Do not treat yourself. Try to avoid being around people who are sick. You may experience fever, chills and shaking during your first infusion. These effects are usually mild and can be treated with other medicines. Report any side effects during the infusion to your health care professional. Fever and chills usually do not  happen with later infusions. Do not become pregnant while taking this medicine or for 7 months after stopping it. Women should inform their doctor if they wish to become pregnant or think they might be pregnant. Women of child-bearing potential will need to have a negative pregnancy test  before starting this medicine. There is a potential for serious side effects to an unborn child. Talk to your health care professional or pharmacist for more information. Do not breast-feed an infant while taking this medicine or for 7 months after stopping it. Women must use effective birth control with this medicine. What side effects may I notice from receiving this medicine? Side effects that you should report to your doctor or health care professional as soon as possible:  allergic reactions like skin rash, itching or hives, swelling of the face, lips, or tongue  chest pain or palpitations  cough  dizziness  feeling faint or lightheaded, falls  fever  general ill feeling or flu-like symptoms  signs of worsening heart failure like breathing problems; swelling in your legs and feet  unusually weak or tired Side effects that usually do not require medical attention (report to your doctor or health care professional if they continue or are bothersome):  bone pain  changes in taste  diarrhea  joint pain  nausea/vomiting  weight loss This list may not describe all possible side effects. Call your doctor for medical advice about side effects. You may report side effects to FDA at 1-800-FDA-1088. Where should I keep my medicine? This drug is given in a hospital or clinic and will not be stored at home. NOTE: This sheet is a summary. It may not cover all possible information. If you have questions about this medicine, talk to your doctor, pharmacist, or health care provider.  2021 Elsevier/Gold Standard (2016-08-30 14:37:52)

## 2020-12-11 ENCOUNTER — Other Ambulatory Visit: Payer: Self-pay

## 2020-12-11 ENCOUNTER — Telehealth: Payer: Self-pay | Admitting: Internal Medicine

## 2020-12-11 DIAGNOSIS — I5032 Chronic diastolic (congestive) heart failure: Secondary | ICD-10-CM

## 2020-12-11 NOTE — Telephone Encounter (Signed)
Patient aware of Wednesday 01/06/21 3:00pom Echocardiogram appointment at [address], [address]---arrival time is 2:45 pm for check in---follow up with Dr, Audie Box was rescheduled to Tuesday 01/12/21 at 3:20 pm---WIll mail information to patient.

## 2020-12-14 ENCOUNTER — Telehealth: Payer: Self-pay | Admitting: Hematology

## 2020-12-14 ENCOUNTER — Ambulatory Visit: Payer: Self-pay | Admitting: Surgery

## 2020-12-14 DIAGNOSIS — C50912 Malignant neoplasm of unspecified site of left female breast: Secondary | ICD-10-CM

## 2020-12-14 NOTE — H&P (Signed)
History of Present Illness Cheryl Blankenship. Cheryl Delval MD; 12/14/2020 12:17 PM) The patient is a 70 year old female who presents with breast cancer. Breast MDC 07/22/20 Cheryl Blankenship  This is a 70 y.o. female seen in the multidisciplinary breast clinic for a new diagnosis of left breast cancer. The patient was noted to have screening detected calcifications and abnormal appearing lymph nodes. Closer examination revealed a large firm palpable mass in the upper outer quadrant. She underwent further diagnostic work-up which revealed a 12 cm span of highly suspicious calcifications in the upper outer and upper inner quadrants of the left breast. Wiithin this area by ultrasound there are confluent masses at the 1 o'clock position spanning at least 4.5 cm and an additional suspicious mass in the left breast at 2:00 was identified. This location was 7 cm from the nipple and measured 9 mm in greatest dimension. There were 2 left axillary lymph nodes that had cortical thickening and additional lymph node with a borderline cortex of 4 mm, totaling approximately 3 abnormal appearing nodes. She underwent a biopsy on 07/16/2020. 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 A second biopsy of another region of the upper inner quadrant also revealed IDC.  MRI performed 07/31/20 showed extensive non-mass enhancement in the left breast involving the upper outer lower-outer lower inner quadrants extending towards the chest wall with 3 enlarged lymph nodes in the left axilla. This included the biopsied lymph node in the axilla. She had an incidental finding of a small lesion in the right breast upper inner quadrant that  underwent MR biopsy. This revealed a papillary lesion. The patient underwent port placement and neoadjuvant chemotherapy. She tolerated chemotherapy poorly and so her medications were switched. She is currently only on immunotherapy. She had a follow-up MRI on 12/04/20 that showed significant improvement in the left breast with several scattered remaining areas of non-mass enhancement. The lower-outer quadrant measures 4.4 cm the upper outer quadrant measures 3.2 cm, and the upper outer quadrant has another area measuring 2.7 cm. The 3 enlarged lymph nodes are all smaller than previously measured. The right breast shows no areas of any enhancing mass or non-mass enhancement.  She is accompanied by her son and her daughter is on speaker phone.       Problem List/Past Medical 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, MD; 12/14/2020 12:17 PM) Breast Biopsy Left. Cataract Surgery Bilateral. Cesarean Section - Multiple Gallbladder Surgery - Laparoscopic  Diagnostic Studies History Cheryl Blankenship. [name], MD; 12/14/2020 12:17 PM) Colonoscopy 1-5 years ago Mammogram within last year Pap Smear 1-5 years ago  Allergies ([name], [name]; 12/14/2020 11:14 AM) Lisinopril *ANTIHYPERTENSIVES* Allergies Reconciled  Medication History ([name], [name]; 12/14/2020 11:14 AM) Aspirin (81MG Tablet DR, Oral) Active. Norvasc (10MG Tablet, Oral) Active. Lipitor (40MG Tablet, Oral) Active. Cholecalciferol (25 MCG(1000 UT) Capsule, Oral) Active. Cymbalta (30MG Capsule DR Part, Oral) Active. Jardiance (25MG Tablet, Oral) Active. Folic Acid (1MG Tablet, Oral) Active. Lasix (40MG Tablet, Oral) Active. Neurontin (400MG Capsule, Oral) Active. glipiZIDE XL (5MG Tablet ER 24HR, Oral) Active. Lantus SoloStar (100UNIT/ML Soln Pen-inj, Subcutaneous) Active. NovoLIN R (100UNIT/ML  Solution, Injection) Active. Cozaar (100MG Tablet, Oral) Active. Glucophage (1000MG Tablet, Oral) Active. Lopressor (50MG Tablet, Oral) Active. PriLOSEC (20MG Capsule DR, Oral) Active. Zofran ODT (4MG Tablet Disint, Oral) Active. Potassium Chloride (10MEQ Tablet ER, Oral) Active. Lyrica (75MG Capsule, Oral) Active. Zanaflex (4MG Capsule, Oral) Active. traMADol HCl (50MG Tablet, Oral) Active. Medications Reconciled  Social History Cheryl Blankenship. [name], MD; 12/14/2020 12:17 PM) Caffeine use Coffee, Tea. No alcohol use No drug use Tobacco use Former smoker.  Family History Cheryl Blankenship. [name], MD; 12/14/2020 12:17 PM) Arthritis Brother, Mother. Bleeding disorder Mother. Cerebrovascular Accident Mother. Cervical Cancer Mother. Depression Son. Diabetes Mellitus Mother, [name], [name]. Heart Disease Brother, Son. Heart disease in female family member before age 15 Hypertension Mother, Son. Ovarian Cancer Sister. Seizure disorder Son.  Pregnancy / Birth History Cheryl Blankenship. [name], MD; 12/14/2020 12:17 PM) Age at menarche 22 years. Age of menopause <47 Contraceptive History Intrauterine device, Oral contraceptives. Gravida 4 Length (months) of breastfeeding 3-6 Maternal age 33-20 Para 3 Regular periods  Other Problems Cheryl Blankenship. [name], MD; 12/14/2020 12:17 PM) Arthritis Congestive Heart Failure Diabetes Mellitus Gastroesophageal Reflux Disease High blood pressure Hypercholesterolemia Myocardial infarction     Physical Exam [name] [name] MD; 12/14/2020 12:18 PM)  The physical exam findings are as follows: Note:Constitutional: WDWN in NAD, conversant, no obvious deformities; resting comfortably Eyes: Pupils equal, round; sclera anicteric; moist conjunctiva; no lid lag HENT: Oral mucosa moist; good dentition Neck: No masses palpated, trachea midline; no thyromegaly Lungs: CTA bilaterally; normal respiratory effort Breasts: Symmetric,  port site is clear No nipple changes or discharge, no palpable lymphadenopathy, no palpable masses in either breast CV: Regular rate and rhythm; no murmurs; extremities well-perfused with no edema Abd: +bowel sounds, soft, non-tender, no palpable organomegaly; no palpable hernias Musc: Normal gait; no apparent clubbing or cyanosis in extremities Lymphatic: No palpable cervical or axillary lymphadenopathy Skin: Warm, dry; no sign of jaundice Psychiatric - alert and oriented x 4; calm mood and affect    Assessment & Plan [name] 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 The surgical procedure has been discussed with the patient. Potential risks, benefits, alternative treatments, and expected outcomes have been explained. All of the patient's questions at this time have been answered. The likelihood of reaching the patient's treatment goal is good. The patient understand the proposed surgical procedure and wishes to proceed. Note:Had a long discussion with the patient and her children regarding the recommended therapy. She has improved on the limited chemotherapy and immunotherapy that she has received. However there are still several large areas of enhancement in multiple quadrants of the left breast. Therefore lumpectomy is not indicated in this situation. We recommend a left mastectomy with targeted axillary lymph node dissection. The patient and her daughter had a lot of questions which I answered to the best of my ability. She is in agreement with this plan.  The right breast papillary lesion was biopsied and does not really show up on the most recent MRI. Therefore we will not pursue any type of right breast lumpectomy at this time.  I spent some time discussing the upcoming procedure with the patient and her family. I answered  all of their questions to the best of my ability.  Cheryl Blankenship. [name], MD, [facility]  General/ Trauma Surgery   12/14/2020 12:20 PM

## 2020-12-14 NOTE — Telephone Encounter (Signed)
Left message with follow-up appointment per 3/24 los. Gave option to call back to reschedule if needed. 

## 2020-12-14 NOTE — H&P (View-Only) (Signed)
History of Present Illness Cheryl Blankenship. Cheryl Redel MD; 12/14/2020 12:17 PM) The patient is a 70 year old female who presents with breast cancer. Breast MDC 07/22/20 Cheryl Blankenship  This is a 70 y.o. female seen in the multidisciplinary breast clinic for a new diagnosis of left breast cancer. The patient was noted to have screening detected calcifications and abnormal appearing lymph nodes. Closer examination revealed a large firm palpable mass in the upper outer quadrant. She underwent further diagnostic work-up which revealed a 12 cm span of highly suspicious calcifications in the upper outer and upper inner quadrants of the left breast. Wiithin this area by ultrasound there are confluent masses at the 1 o'clock position spanning at least 4.5 cm and an additional suspicious mass in the left breast at 2:00 was identified. This location was 7 cm from the nipple and measured 9 mm in greatest dimension. There were 2 left axillary lymph nodes that had cortical thickening and additional lymph node with a borderline cortex of 4 mm, totaling approximately 3 abnormal appearing nodes. She underwent a biopsy on 07/16/2020. 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 A second biopsy of another region of the upper inner quadrant also revealed IDC.  MRI performed 07/31/20 showed extensive non-mass enhancement in the left breast involving the upper outer lower-outer lower inner quadrants extending towards the chest wall with 3 enlarged lymph nodes in the left axilla. This included the biopsied lymph node in the axilla. She had an incidental finding of a small lesion in the right breast upper inner quadrant that  underwent MR biopsy. This revealed a papillary lesion. The patient underwent port placement and neoadjuvant chemotherapy. She tolerated chemotherapy poorly and so her medications were switched. She is currently only on immunotherapy. She had a follow-up MRI on 12/04/20 that showed significant improvement in the left breast with several scattered remaining areas of non-mass enhancement. The lower-outer quadrant measures 4.4 cm the upper outer quadrant measures 3.2 cm, and the upper outer quadrant has another area measuring 2.7 cm. The 3 enlarged lymph nodes are all smaller than previously measured. The right breast shows no areas of any enhancing mass or non-mass enhancement.  She is accompanied by her son and her daughter is on speaker phone.       Problem List/Past Medical Cheryl Key K. 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 Dann Ventress, MD; 12/14/2020 12:17 PM) Breast Biopsy Left. Cataract Surgery Bilateral. Cesarean Section - Multiple Gallbladder Surgery - Laparoscopic  Diagnostic Studies History Cheryl Blankenship. Cheryl Marczak, MD; 12/14/2020 12:17 PM) Colonoscopy 1-5 years ago Mammogram within last year Pap Smear 1-5 years ago  Allergies (Cheryl Blankenship, [name]; 12/14/2020 11:14 AM) Lisinopril *ANTIHYPERTENSIVES* Allergies Reconciled  Medication History (Cheryl Blankenship, [name]; 12/14/2020 11:14 AM) Aspirin (81MG Tablet DR, Oral) Active. Norvasc (10MG Tablet, Oral) Active. Lipitor (40MG Tablet, Oral) Active. Cholecalciferol (25 MCG(1000 UT) Capsule, Oral) Active. Cymbalta (30MG Capsule DR Part, Oral) Active. Jardiance (25MG Tablet, Oral) Active. Folic Acid (1MG Tablet, Oral) Active. Lasix (40MG Tablet, Oral) Active. Neurontin (400MG Capsule, Oral) Active. glipiZIDE XL (5MG Tablet ER 24HR, Oral) Active. Lantus SoloStar (100UNIT/ML Soln Pen-inj, Subcutaneous) Active. NovoLIN R (100UNIT/ML  Solution, Injection) Active. Cozaar (100MG Tablet, Oral) Active. Glucophage (1000MG Tablet, Oral) Active. Lopressor (50MG Tablet, Oral) Active. PriLOSEC (20MG Capsule DR, Oral) Active. Zofran ODT (4MG Tablet Disint, Oral) Active. Potassium Chloride (10MEQ Tablet ER, Oral) Active. Lyrica (75MG Capsule, Oral) Active. Zanaflex (4MG Capsule, Oral) Active. traMADol HCl (50MG Tablet, Oral) Active. Medications Reconciled  Social History Cheryl Blankenship. Cheryl Peregoy, MD; 12/14/2020 12:17 PM) Caffeine use Coffee, Tea. No alcohol use No drug use Tobacco use Former smoker.  Family History Cheryl Blankenship. Cheryl Dover, MD; 12/14/2020 12:17 PM) Arthritis Brother, Mother. Bleeding disorder Mother. Cerebrovascular Accident Mother. Cervical Cancer Mother. Depression Son. Diabetes Mellitus Mother, Cheryl Blankenship, Cheryl Blankenship. Heart Disease Brother, Son. Heart disease in female family member before age 15 Hypertension Mother, Son. Ovarian Cancer Sister. Seizure disorder Son.  Pregnancy / Birth History Cheryl Blankenship. Cheryl Violette, MD; 12/14/2020 12:17 PM) Age at menarche 22 years. Age of menopause <47 Contraceptive History Intrauterine device, Oral contraceptives. Gravida 4 Length (months) of breastfeeding 3-6 Maternal age 33-20 Para 3 Regular periods  Other Problems Cheryl Blankenship. Cheryl Kothari, MD; 12/14/2020 12:17 PM) Arthritis Congestive Heart Failure Diabetes Mellitus Gastroesophageal Reflux Disease High blood pressure Hypercholesterolemia Myocardial infarction     Physical Exam Cheryl Key K. Cheryl Grewe MD; 12/14/2020 12:18 PM)  The physical exam findings are as follows: Note:Constitutional: WDWN in NAD, conversant, no obvious deformities; resting comfortably Eyes: Pupils equal, round; sclera anicteric; moist conjunctiva; no lid lag HENT: Oral mucosa moist; good dentition Neck: No masses palpated, trachea midline; no thyromegaly Lungs: CTA bilaterally; normal respiratory effort Breasts: Symmetric,  port site is clear No nipple changes or discharge, no palpable lymphadenopathy, no palpable masses in either breast CV: Regular rate and rhythm; no murmurs; extremities well-perfused with no edema Abd: +bowel sounds, soft, non-tender, no palpable organomegaly; no palpable hernias Musc: Normal gait; no apparent clubbing or cyanosis in extremities Lymphatic: No palpable cervical or axillary lymphadenopathy Skin: Warm, dry; no sign of jaundice Psychiatric - alert and oriented x 4; calm mood and affect    Assessment & Plan Cheryl Key K. 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 The surgical procedure has been discussed with the patient. Potential risks, benefits, alternative treatments, and expected outcomes have been explained. All of the patient's questions at this time have been answered. The likelihood of reaching the patient's treatment goal is good. The patient understand the proposed surgical procedure and wishes to proceed. Note:Had a long discussion with the patient and her children regarding the recommended therapy. She has improved on the limited chemotherapy and immunotherapy that she has received. However there are still several large areas of enhancement in multiple quadrants of the left breast. Therefore lumpectomy is not indicated in this situation. We recommend a left mastectomy with targeted axillary lymph node dissection. The patient and her daughter had a lot of questions which I answered to the best of my ability. She is in agreement with this plan.  The right breast papillary lesion was biopsied and does not really show up on the most recent MRI. Therefore we will not pursue any type of right breast lumpectomy at this time.  I spent some time discussing the upcoming procedure with the patient and her family. I answered  all of their questions to the best of my ability.  Cheryl Blankenship. Cheryl Dover, MD, [facility]  General/ Trauma Surgery   12/14/2020 12:20 PM

## 2020-12-15 ENCOUNTER — Encounter: Payer: Self-pay | Admitting: *Deleted

## 2020-12-15 ENCOUNTER — Other Ambulatory Visit: Payer: Self-pay | Admitting: Surgery

## 2020-12-15 DIAGNOSIS — C50912 Malignant neoplasm of unspecified site of left female breast: Secondary | ICD-10-CM

## 2020-12-16 ENCOUNTER — Telehealth: Payer: Self-pay | Admitting: Nurse Practitioner

## 2020-12-16 NOTE — Telephone Encounter (Signed)
Scheduled appts per 3/29 sch msg. Pt's daughter is aware.

## 2020-12-28 ENCOUNTER — Other Ambulatory Visit (HOSPITAL_COMMUNITY)
Admission: RE | Admit: 2020-12-28 | Discharge: 2020-12-28 | Disposition: A | Discharge status: Home / Self Care | Payer: Medicare HMO | Source: Ambulatory Visit | Attending: Surgery | Admitting: Surgery

## 2020-12-28 DIAGNOSIS — Z01812 Encounter for preprocedural laboratory examination: Secondary | ICD-10-CM | POA: Diagnosis present

## 2020-12-28 DIAGNOSIS — Z20822 Contact with and (suspected) exposure to covid-19: Secondary | ICD-10-CM | POA: Diagnosis not present

## 2020-12-28 LAB — SARS CORONAVIRUS 2 (TAT 6-24 HRS): SARS Coronavirus 2: NEGATIVE

## 2020-12-29 ENCOUNTER — Encounter (HOSPITAL_COMMUNITY): Payer: Self-pay | Admitting: Surgery

## 2020-12-29 ENCOUNTER — Ambulatory Visit
Admission: RE | Admit: 2020-12-29 | Discharge: 2020-12-29 | Disposition: A | Discharge status: Home / Self Care | Payer: Medicare HMO | Source: Ambulatory Visit | Attending: Surgery | Admitting: Surgery

## 2020-12-29 ENCOUNTER — Other Ambulatory Visit: Payer: Self-pay

## 2020-12-29 ENCOUNTER — Other Ambulatory Visit: Payer: Self-pay | Admitting: Surgery

## 2020-12-29 DIAGNOSIS — C50912 Malignant neoplasm of unspecified site of left female breast: Secondary | ICD-10-CM

## 2020-12-29 IMAGING — US US PLC BREAST LOC DEV 1ST LESION INC US GUIDE*L*
1 series · 2 of 2 positions shown · non-contrast
Comparison: Previous exam(s).

CLINICAL DATA: Patient is scheduled for targeted LEFT axillary
lymph node dissection requiring preoperative radioactive seed
localization.

EXAM:
ULTRASOUND GUIDED RADIOACTIVE SEED LOCALIZATION OF THE LEFT AXILLA

[Series 1: us plc breast loc dev 1st lesion inc us guide*left · 0.06mm/px · 2 of 2 slices shown]
[im 1/2]
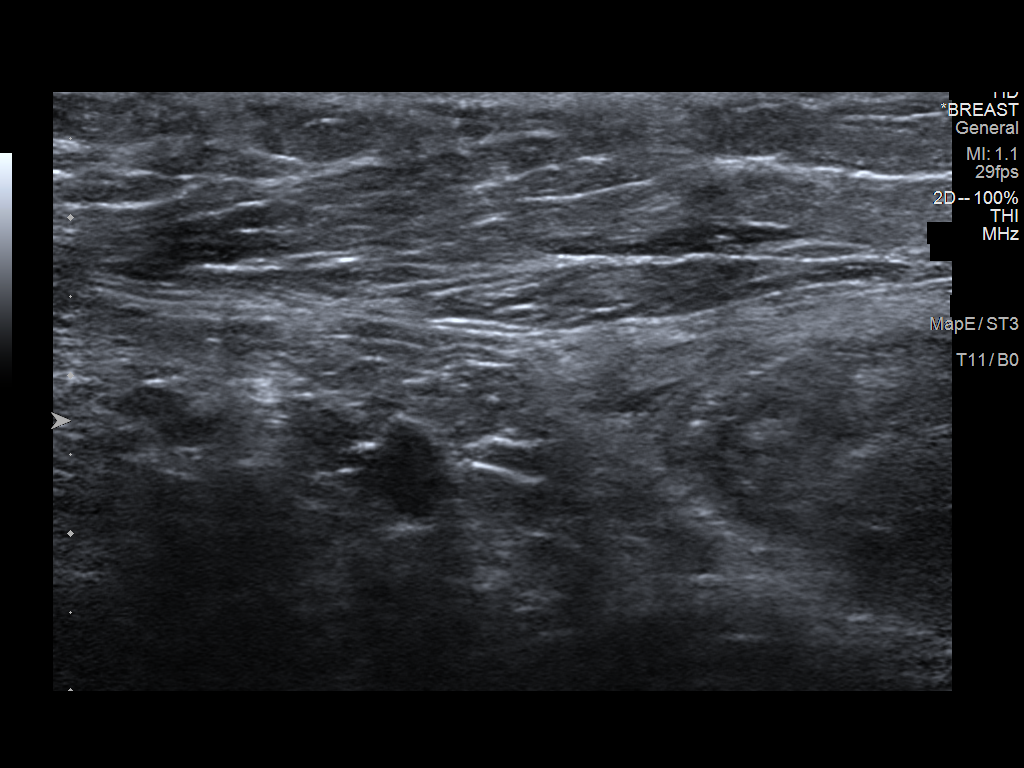
[im 2/2]
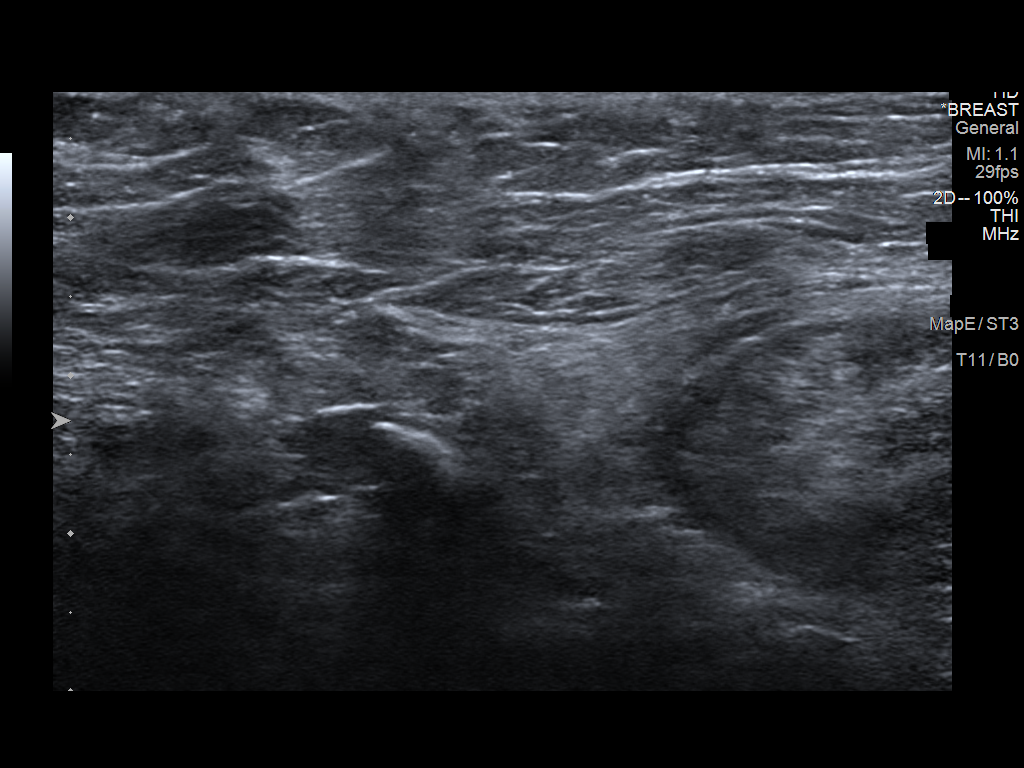

[2 of 2 positions shown; findings below may reference images not displayed]

FINDINGS: Patient presents for radioactive seed localization prior to targeted
lymph node dissection. I met with the patient and we discussed the
procedure of seed localization including benefits and alternatives.
We discussed the high likelihood of a successful procedure. We
discussed the risks of the procedure including infection, bleeding,
tissue injury and further surgery. We discussed the low dose of
radioactivity involved in the procedure. Informed, written consent
was given.

The usual time-out protocol was performed immediately prior to the
procedure.

Using ultrasound guidance, sterile technique, 1% lidocaine and an
[06] radioactive seed, the axillary lymph node which appeared to
contain a biopsy clip was localized using a lateral approach. The
follow-up mammogram images confirm the seed to be within a different
lymph node than the originally biopsied lymph node, and this was
marked for Dr. HERMELINDO.

Follow-up survey of the patient confirms presence of the radioactive
seed.

Order number of [06] seed:  [PHONE_NUMBER].

Total activity:  0.258 millicuries reference Date: [DATE]

The patient tolerated the procedure well and was released from the
[REDACTED]. She was given instructions regarding seed removal.
IMPRESSION: 1. Radioactive seed localization left axilla. No apparent
complications.
2. Postprocedure mammogram shows that this radioactive seed
placement is within a morphologically abnormal lymph node does not
correspond to the originally biopsied lymph node. As such,
additional attempt will be made to place a second (separate)
radioactive seed into the lymph node containing the heart shaped
clip using mammographic guidance. Additional procedure report to
follow.

## 2020-12-29 IMAGING — MG MM BREAST LOCALIZATION CLIP
4 series · 4 of 12 positions shown · non-contrast
Comparison: Previous exam(s).

CLINICAL DATA: Status post ultrasound-guided radioactive seed
placement. Patient is scheduled for a targeted LEFT axillary lymph
node dissection tomorrow.

EXAM:
DIAGNOSTIC LEFT MAMMOGRAM POST ULTRASOUND-GUIDED RADIOACTIVE SEED
PLACEMENT

[L MLO synth-2D (1 of 2)]
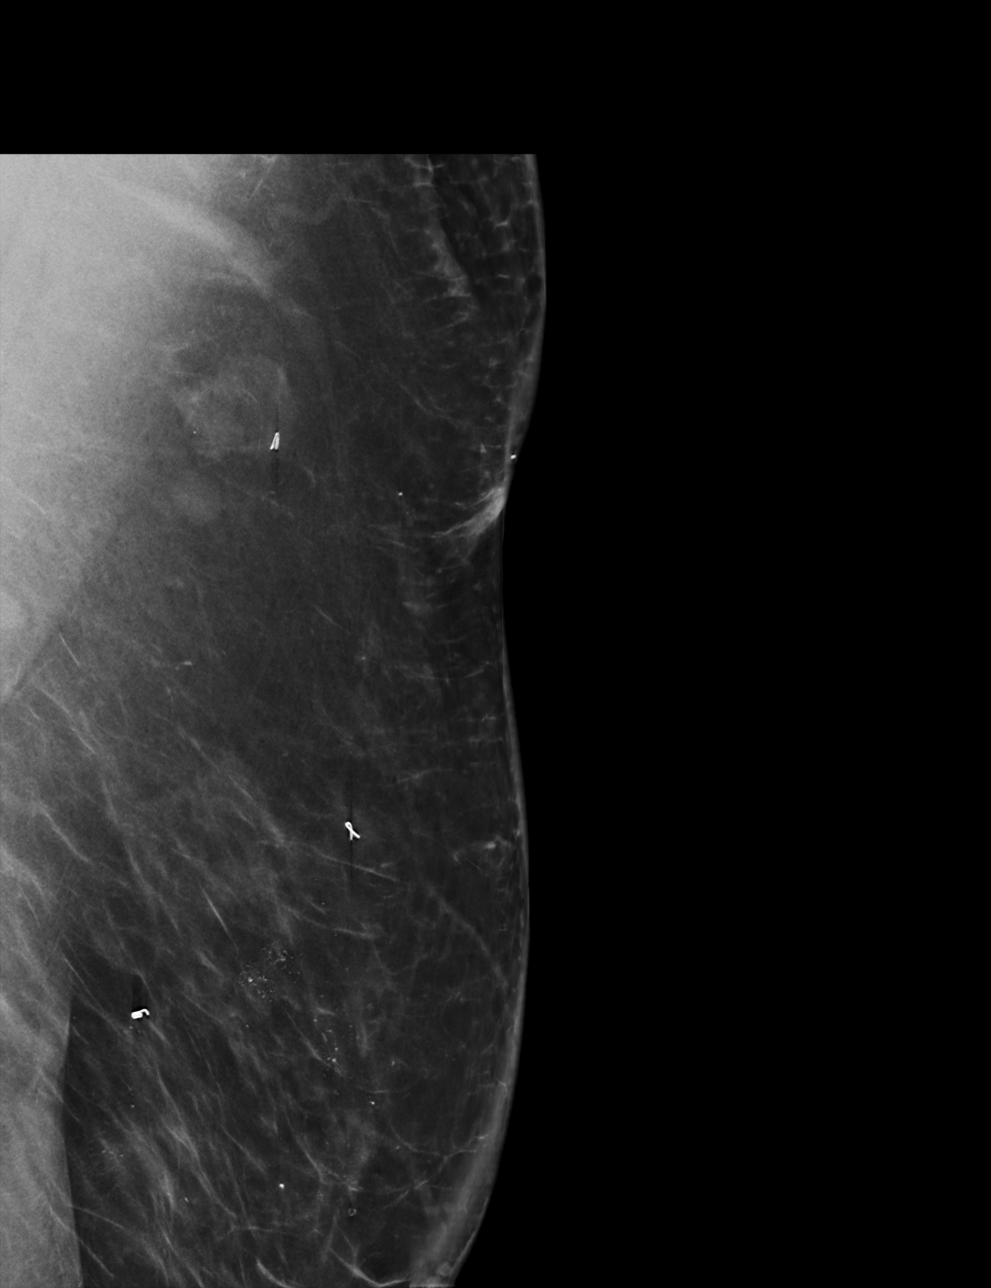

[L MLO synth-2D (2 of 2)]
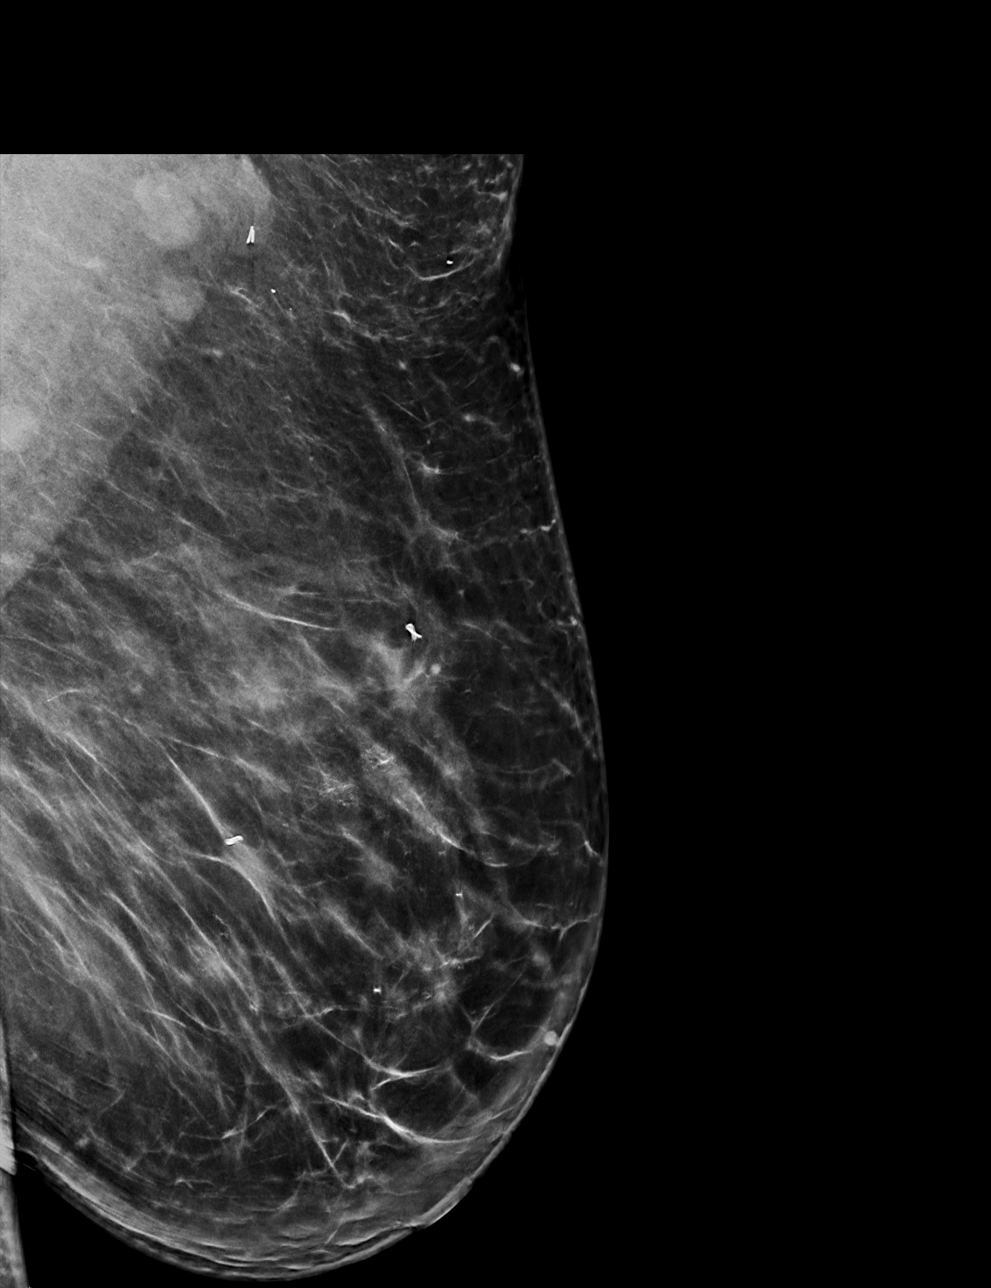

[L MLO tomo (1 of 2) · tomo slice 63/125.0]
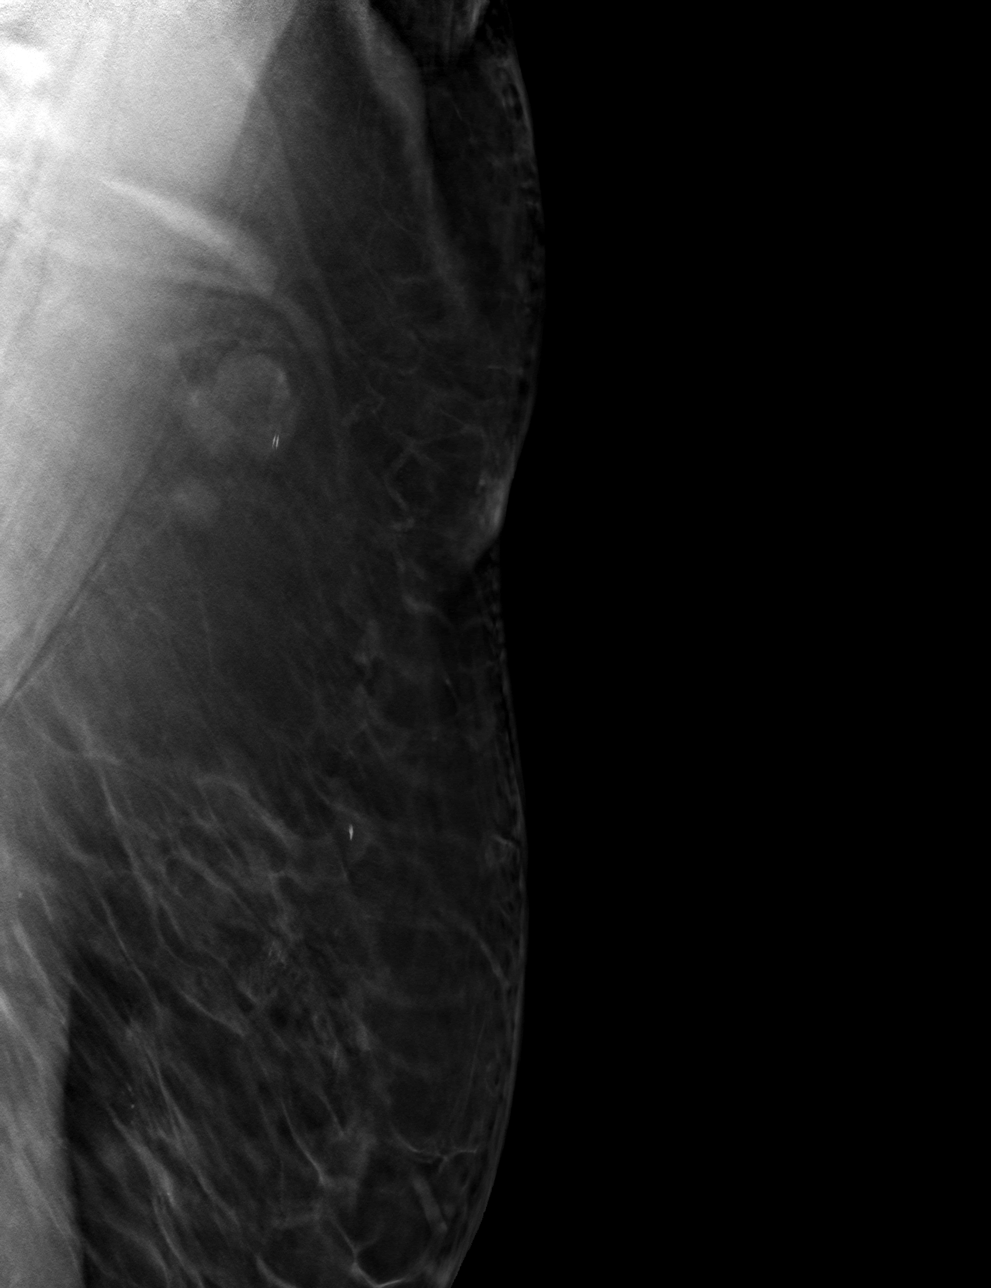

[L MLO tomo (2 of 2) · tomo slice 47/94.0]
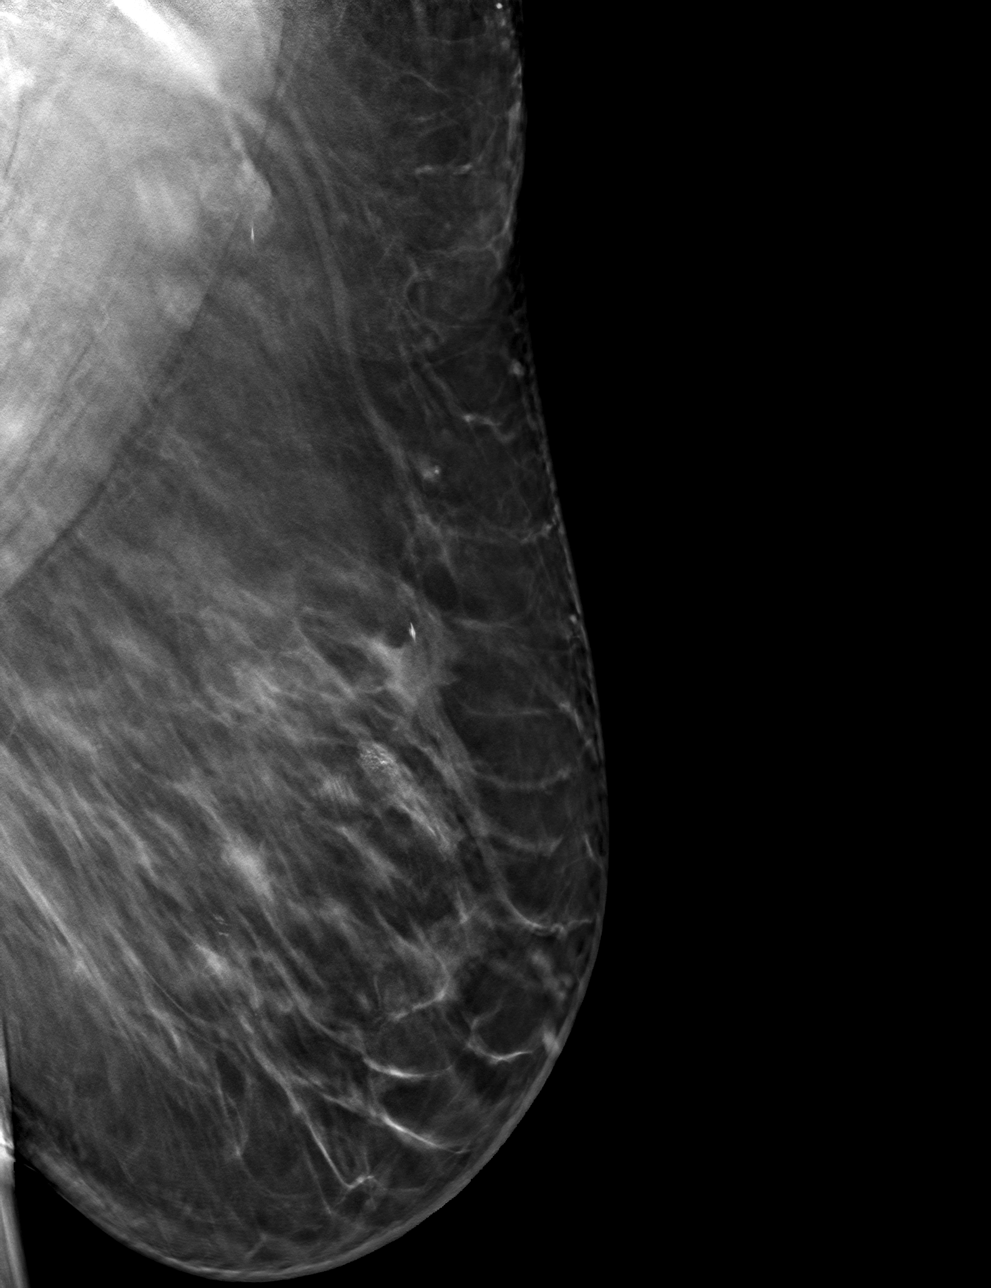

[4 of 12 positions shown; findings below may reference images not displayed]

FINDINGS: Mammographic images were obtained following ultrasound-guided
radioactive seed placement. These demonstrate that the radioactive
seed has been placed into a different morphologically abnormal lymph
node than the lymph node which was biopsied on [DATE].
IMPRESSION: Radioactive seed placed today using ultrasound guidance is located
within a different morphologically abnormal lymph node than the
lymph node which was biopsied on [DATE].

As such, an additional radioactive seed will be placed into the
lymph node containing the heart shaped clip using mammographic
guidance.

Final Assessment: Post Procedure Mammograms for Seed Placement

## 2020-12-29 IMAGING — MG MM PLC BREAST LOC DEV 1ST LESION INC MAMMO GUIDE*L*
8 of 12 series · 8 of 32 positions shown · non-contrast
Comparison: Previous exam(s).

CLINICAL DATA: Patient is scheduled for targeted LEFT axillary
lymph node dissection tomorrow. Ultrasound-guided radioactive seed
placement was performed earlier today. Subsequent postprocedural
mammogram showed that the radioactive seed was placed into a lymph
node which did not correspond to the lymph node biopsied on
[DATE] (containing a heart shaped clip). Now, using mammographic
guidance, attempt will be made to place a radioactive seed into the
lymph node which contains the heart shaped clip.

EXAM:
MAMMOGRAPHIC GUIDED RADIOACTIVE SEED LOCALIZATION OF THE LEFT AXILLA

[L XCCL (1 of 2)]
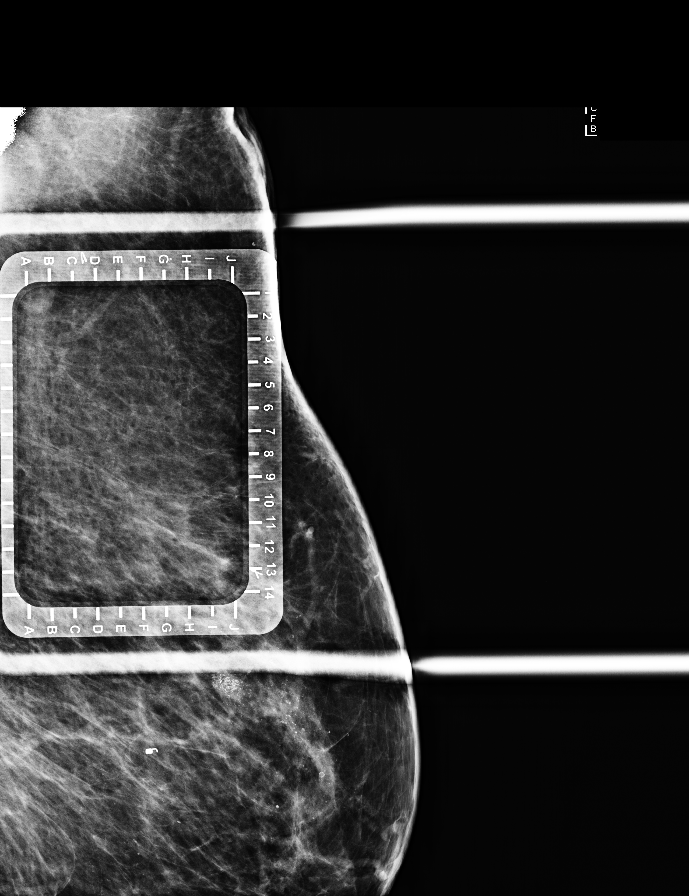

[L XCCL (2 of 2)]
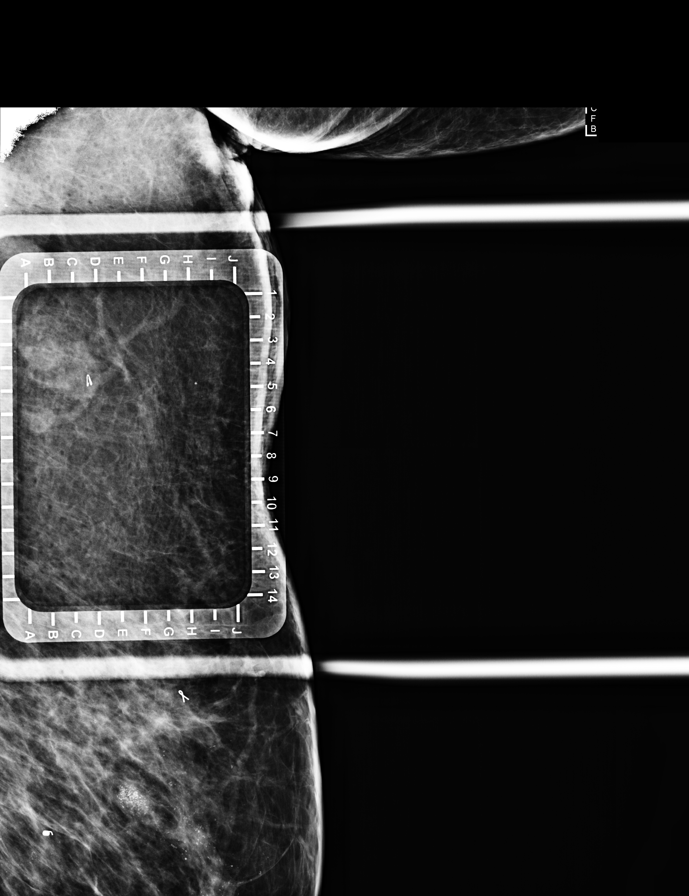

[L MLO synth-2D]
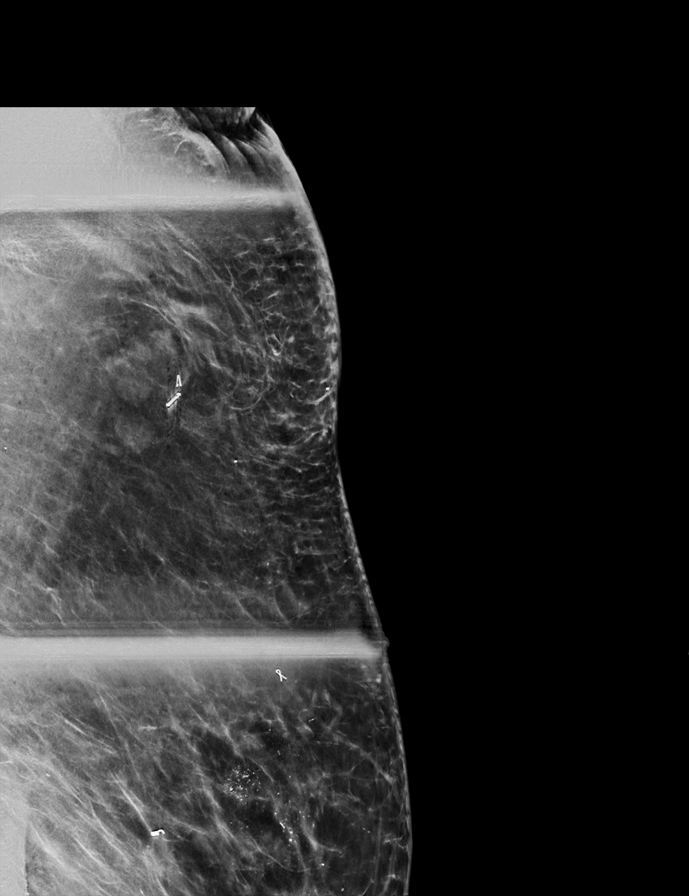

[L XCCL synth-2D (1 of 4)]
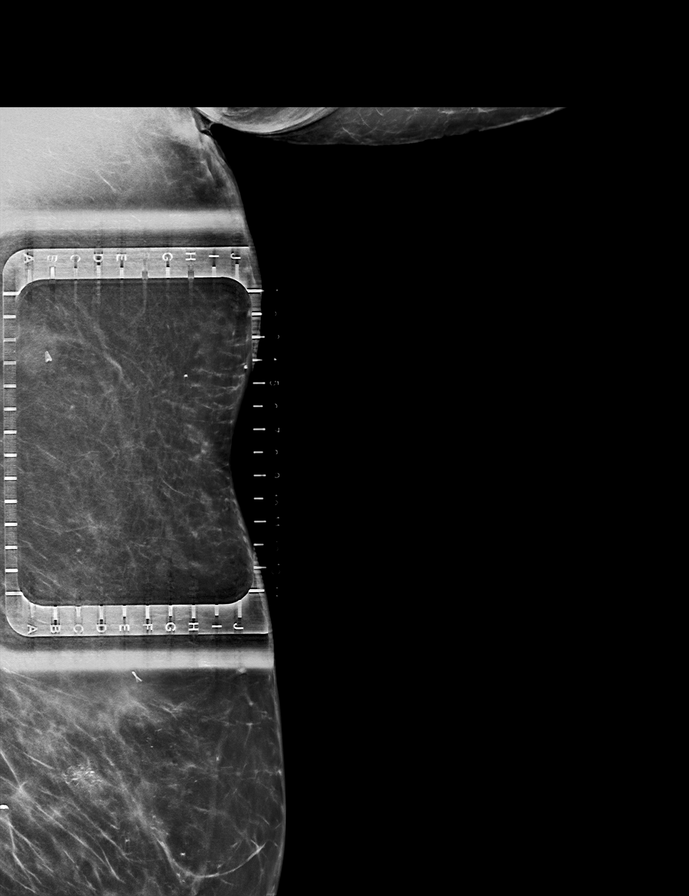

[L XCCL synth-2D (2 of 4)]
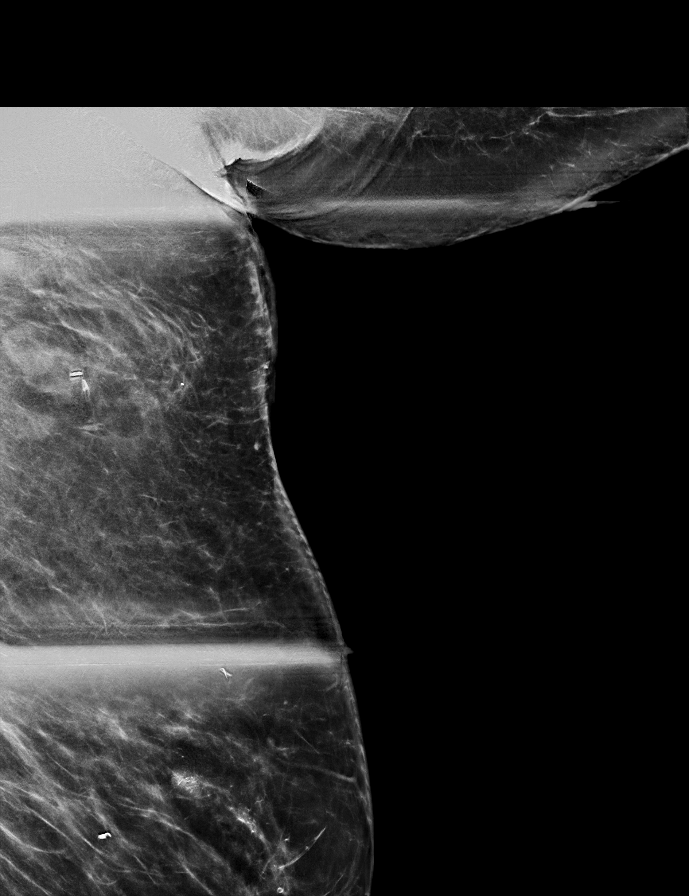

[L XCCL synth-2D (3 of 4)]
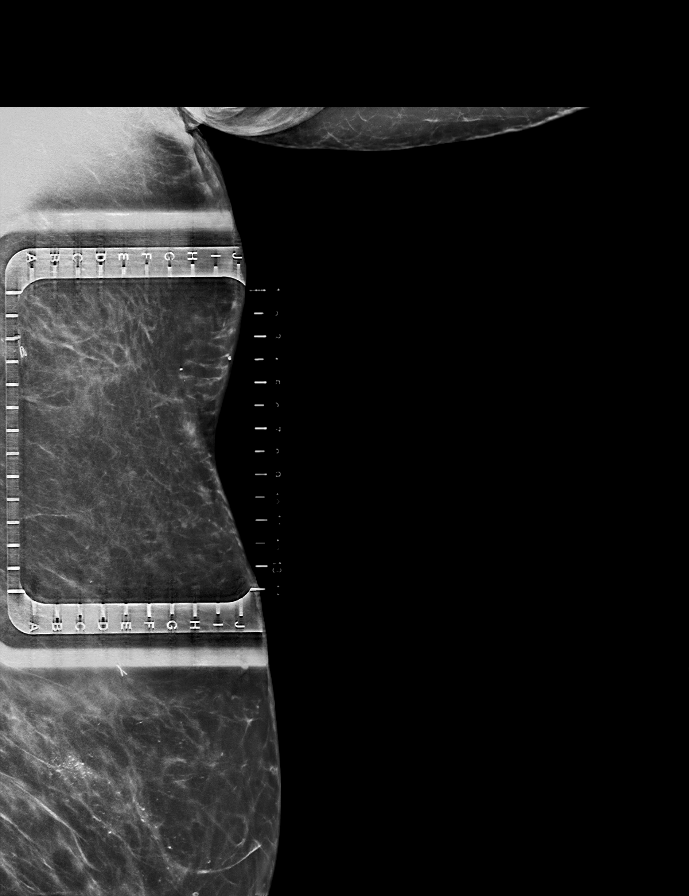

[L XCCL synth-2D (4 of 4)]
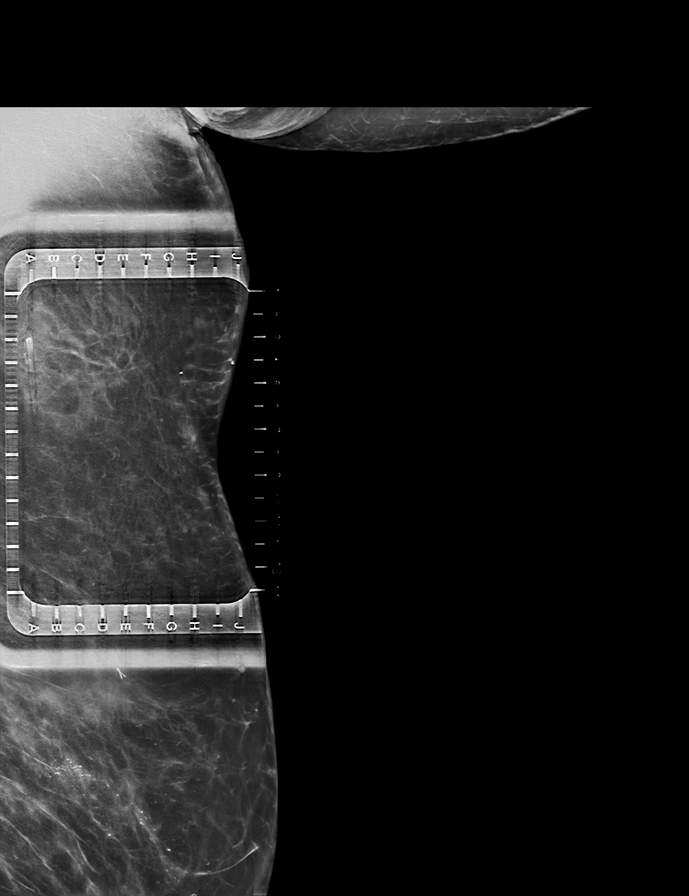

[L XCCL tomo · tomo slice 43/86.0]
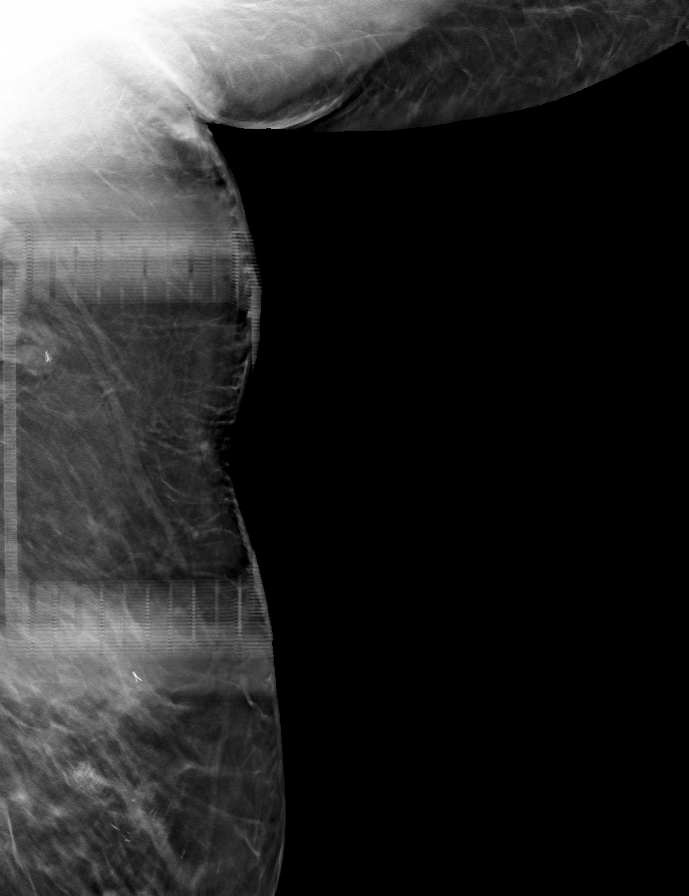

[8 of 32 positions shown; findings below may reference images not displayed]

FINDINGS: Patient presents for radioactive seed localization prior to targeted
lymph node dissection. I met with the patient and we discussed the
procedure of seed localization including benefits and alternatives.
We discussed the high likelihood of a successful procedure. We
discussed the risks of the procedure including infection, bleeding,
tissue injury and further surgery. We discussed the low dose of
radioactivity involved in the procedure. Informed, written consent
was given.

The usual time-out protocol was performed immediately prior to the
procedure.

Using mammographic guidance, sterile technique, 1% lidocaine and an
[3I] radioactive seed, the heart shaped clip within the lower LEFT
axilla was localized using a superior approach. The follow-up
mammogram images confirm the seed in the expected location and were
marked for Dr. PAULUS N.

Follow-up survey of the patient confirms presence of the radioactive
seed.

Order number of [3I] seed:  [PHONE_NUMBER].

Total activity:  0.258 millicuries reference Date: [DATE]

The patient tolerated the procedure well and was released from the
[REDACTED]. She was given instructions regarding seed removal.
IMPRESSION: Radioactive seed localization left breast. No apparent
complications.

There are now 2 radioactive seeds within 2 separate lymph nodes in
the LEFT axilla. This was discussed with Dr. PAULUS N on [DATE] at
[DATE] p.m.

## 2020-12-29 NOTE — Progress Notes (Signed)
Spoke with pt and daughter, Cheryl Blankenship for pre-op call. Pt has hx of CHF, no recent shortness of breath, chest pain or fluid in her ankles. Pt is a type 2 diabetic. Last A1C was 10.3 on 10/30/20. Pt states her fasting blood sugar has improved since she is not getting chemo now. States they are between 130-150. Instructed pt not to take her Jardiance, Glipizide or Metformin in the AM. Instructed pt to take 1/2 of her regular dose of Lantus Insulin this evening and in the AM. She will take 35 units each time. Instructed pt to check her blood sugar when she gets up in the AM.  If blood sugar is >220 take 1/2 of usual correction dose of Novolog insulin. If blood sugar is 70 or below, treat with 1/2 cup of clear juice (apple or cranberry) and recheck blood sugar 15 minutes after drinking juice - On arrival be sure to let your nurse know that you had to drink the juice.  Covid test done 12/28/20 and it's negative. Pt states she has been in quarantine since the test was done except [facility]. Pt understands that she stays in quarantine until she comes to the hospital tomorrow.

## 2020-12-29 NOTE — Anesthesia Preprocedure Evaluation (Addendum)
Anesthesia Evaluation  Patient identified by MRN, date of birth, ID band Patient awake    Reviewed: Allergy & Precautions, NPO status , Patient's Chart, lab work & pertinent test results  History of Anesthesia Complications (+) PONV  Airway Mallampati: II  TM Distance: >3 FB     Dental   Pulmonary former smoker,    breath sounds clear to auscultation       Cardiovascular hypertension, +CHF   Rhythm:Regular Rate:Normal     Neuro/Psych PSYCHIATRIC DISORDERS Anxiety Depression CVA    GI/Hepatic Neg liver ROS, GERD  ,  Endo/Other  diabetes  Renal/GU negative Renal ROS     Musculoskeletal   Abdominal   Peds  Hematology   Anesthesia Other Findings   Reproductive/Obstetrics                            Anesthesia Physical Anesthesia Plan  ASA: III  Anesthesia Plan: General   Post-op Pain Management:    Induction: Intravenous  PONV Risk Score and Plan: 4 or greater  Airway Management Planned: Oral ETT  Additional Equipment:   Intra-op Plan:   Post-operative Plan:   Informed Consent: I have reviewed the patients History and Physical, chart, labs and discussed the procedure including the risks, benefits and alternatives for the proposed anesthesia with the patient or authorized representative who has indicated his/her understanding and acceptance.     Dental advisory given  Plan Discussed with: Anesthesiologist and CRNA  Anesthesia Plan Comments:        Anesthesia Quick Evaluation

## 2020-12-30 ENCOUNTER — Encounter (HOSPITAL_COMMUNITY)
Admission: RE | Disposition: A | Discharge status: Home / Self Care | Payer: Self-pay | Source: Home / Self Care | Attending: Surgery

## 2020-12-30 ENCOUNTER — Observation Stay (HOSPITAL_COMMUNITY)
Admission: RE | Admit: 2020-12-30 | Discharge: 2021-01-02 | Disposition: A | Discharge status: Home / Self Care | Payer: Medicare HMO | Attending: Surgery | Admitting: Surgery

## 2020-12-30 ENCOUNTER — Other Ambulatory Visit: Payer: Self-pay

## 2020-12-30 ENCOUNTER — Ambulatory Visit (HOSPITAL_COMMUNITY): Payer: Medicare HMO | Admitting: Anesthesiology

## 2020-12-30 ENCOUNTER — Encounter (HOSPITAL_COMMUNITY): Payer: Self-pay | Admitting: Surgery

## 2020-12-30 ENCOUNTER — Ambulatory Visit
Admit: 2020-12-30 | Discharge: 2020-12-30 | Disposition: A | Discharge status: Home / Self Care | Payer: Medicare HMO | Attending: Surgery | Admitting: Surgery

## 2020-12-30 ENCOUNTER — Ambulatory Visit
Admission: RE | Admit: 2020-12-30 | Discharge: 2020-12-30 | Disposition: A | Discharge status: Home / Self Care | Payer: Medicare HMO | Source: Ambulatory Visit | Attending: Surgery | Admitting: Surgery

## 2020-12-30 DIAGNOSIS — Z794 Long term (current) use of insulin: Secondary | ICD-10-CM | POA: Diagnosis not present

## 2020-12-30 DIAGNOSIS — Z87891 Personal history of nicotine dependence: Secondary | ICD-10-CM | POA: Insufficient documentation

## 2020-12-30 DIAGNOSIS — D0512 Intraductal carcinoma in situ of left breast: Principal | ICD-10-CM | POA: Insufficient documentation

## 2020-12-30 DIAGNOSIS — Z7982 Long term (current) use of aspirin: Secondary | ICD-10-CM | POA: Diagnosis not present

## 2020-12-30 DIAGNOSIS — I5031 Acute diastolic (congestive) heart failure: Secondary | ICD-10-CM | POA: Diagnosis not present

## 2020-12-30 DIAGNOSIS — C50912 Malignant neoplasm of unspecified site of left female breast: Secondary | ICD-10-CM

## 2020-12-30 DIAGNOSIS — Z7984 Long term (current) use of oral hypoglycemic drugs: Secondary | ICD-10-CM | POA: Insufficient documentation

## 2020-12-30 DIAGNOSIS — E119 Type 2 diabetes mellitus without complications: Secondary | ICD-10-CM | POA: Insufficient documentation

## 2020-12-30 DIAGNOSIS — I110 Hypertensive heart disease with heart failure: Secondary | ICD-10-CM | POA: Insufficient documentation

## 2020-12-30 DIAGNOSIS — Z79899 Other long term (current) drug therapy: Secondary | ICD-10-CM | POA: Insufficient documentation

## 2020-12-30 HISTORY — DX: Personal history of urinary calculi: Z87.442

## 2020-12-30 HISTORY — DX: Cerebral infarction, unspecified: I63.9

## 2020-12-30 HISTORY — PX: MASTECTOMY WITH AXILLARY LYMPH NODE DISSECTION: SHX5661

## 2020-12-30 HISTORY — DX: Other specified postprocedural states: Z98.890

## 2020-12-30 HISTORY — DX: Gastro-esophageal reflux disease without esophagitis: K21.9

## 2020-12-30 HISTORY — DX: Depression, unspecified: F32.A

## 2020-12-30 HISTORY — DX: Other specified postprocedural states: R11.2

## 2020-12-30 LAB — CBC
HCT: 37.8 % (ref 36.0–46.0)
Hemoglobin: 12 g/dL (ref 12.0–15.0)
MCH: 28.4 pg (ref 26.0–34.0)
MCHC: 31.7 g/dL (ref 30.0–36.0)
MCV: 89.4 fL (ref 80.0–100.0)
Platelets: 285 10*3/uL (ref 150–400)
RBC: 4.23 MIL/uL (ref 3.87–5.11)
RDW: 13.2 % (ref 11.5–15.5)
WBC: 8 10*3/uL (ref 4.0–10.5)
nRBC: 0 % (ref 0.0–0.2)

## 2020-12-30 LAB — BASIC METABOLIC PANEL
Anion gap: 8 (ref 5–15)
BUN: 30 mg/dL — ABNORMAL HIGH (ref 8–23)
CO2: 30 mmol/L (ref 22–32)
Calcium: 10.7 mg/dL — ABNORMAL HIGH (ref 8.9–10.3)
Chloride: 100 mmol/L (ref 98–111)
Creatinine, Ser: 1.19 mg/dL — ABNORMAL HIGH (ref 0.44–1.00)
GFR, Estimated: 50 mL/min — ABNORMAL LOW (ref >60)
Glucose, Bld: 194 mg/dL — ABNORMAL HIGH (ref 70–99)
Potassium: 3.7 mmol/L (ref 3.5–5.1)
Sodium: 138 mmol/L (ref 135–145)

## 2020-12-30 LAB — HEMOGLOBIN A1C
Hgb A1c MFr Bld: 10.1 % — ABNORMAL HIGH (ref 4.8–5.6)
Mean Plasma Glucose: 243.17 mg/dL

## 2020-12-30 LAB — POCT I-STAT, CHEM 8
BUN: 32 mg/dL — ABNORMAL HIGH (ref 8–23)
Calcium, Ion: 1.35 mmol/L (ref 1.15–1.40)
Chloride: 101 mmol/L (ref 98–111)
Creatinine, Ser: 1.2 mg/dL — ABNORMAL HIGH (ref 0.44–1.00)
Glucose, Bld: 179 mg/dL — ABNORMAL HIGH (ref 70–99)
HCT: 35 % — ABNORMAL LOW (ref 36.0–46.0)
Hemoglobin: 11.9 g/dL — ABNORMAL LOW (ref 12.0–15.0)
Potassium: 3.7 mmol/L (ref 3.5–5.1)
Sodium: 140 mmol/L (ref 135–145)
TCO2: 28 mmol/L (ref 22–32)

## 2020-12-30 LAB — GLUCOSE, CAPILLARY
Glucose-Capillary: 187 mg/dL — ABNORMAL HIGH (ref 70–99)
Glucose-Capillary: 147 mg/dL — ABNORMAL HIGH (ref 70–99)
Glucose-Capillary: 318 mg/dL — ABNORMAL HIGH (ref 70–99)
Glucose-Capillary: 322 mg/dL — ABNORMAL HIGH (ref 70–99)

## 2020-12-30 IMAGING — MG MM BREAST SURGICAL SPECIMEN
1 series · 1 of 1 positions shown · non-contrast
Comparison: Previous exam(s).

CLINICAL DATA: Status post targeted axillary lymph node dissection
today after earlier radioactive seed localization.

EXAM:
SPECIMEN RADIOGRAPH OF THE LEFT AXILLA

[L]
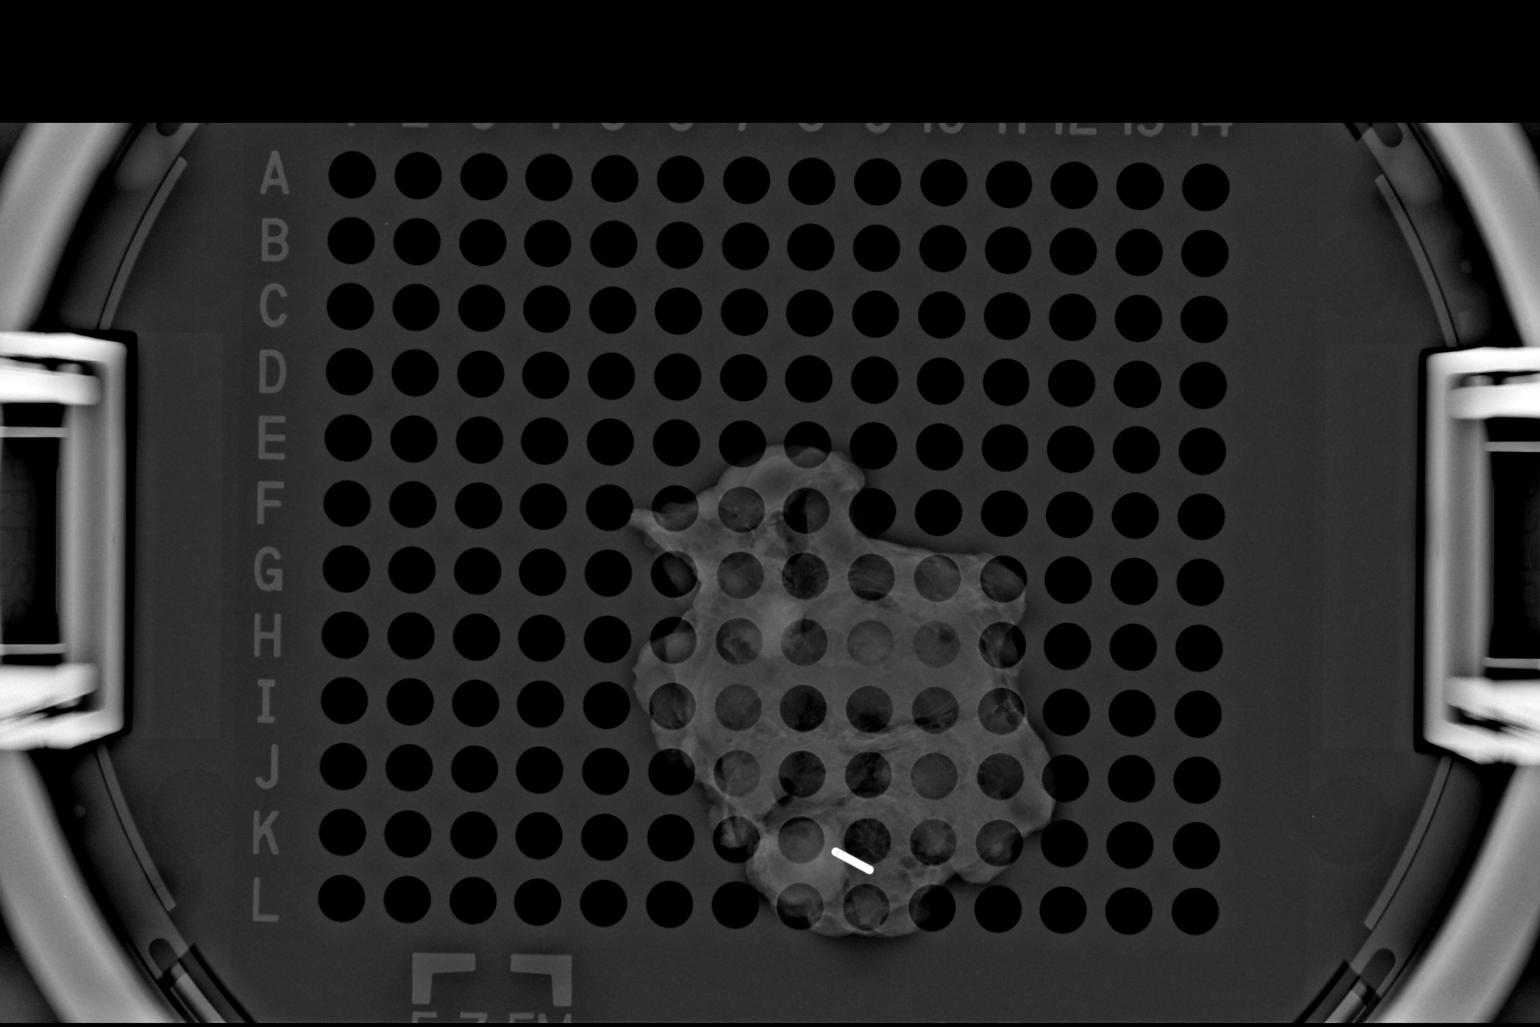

[1 of 1 positions shown; findings below may reference images not displayed]

FINDINGS: Status post excision of the LEFT axilla. The radioactive seed is
present, completely intact, and were marked for pathology.
IMPRESSION: Specimen radiograph of the left axilla.

## 2020-12-30 IMAGING — MG MM BREAST SURGICAL SPECIMEN
1 series · 1 of 1 positions shown · non-contrast
Comparison: Previous exam(s).

CLINICAL DATA: Status post targeted lymph node dissection today
after earlier radioactive seed localization.

EXAM:
SPECIMEN RADIOGRAPH OF THE LEFT BREAST

[L]
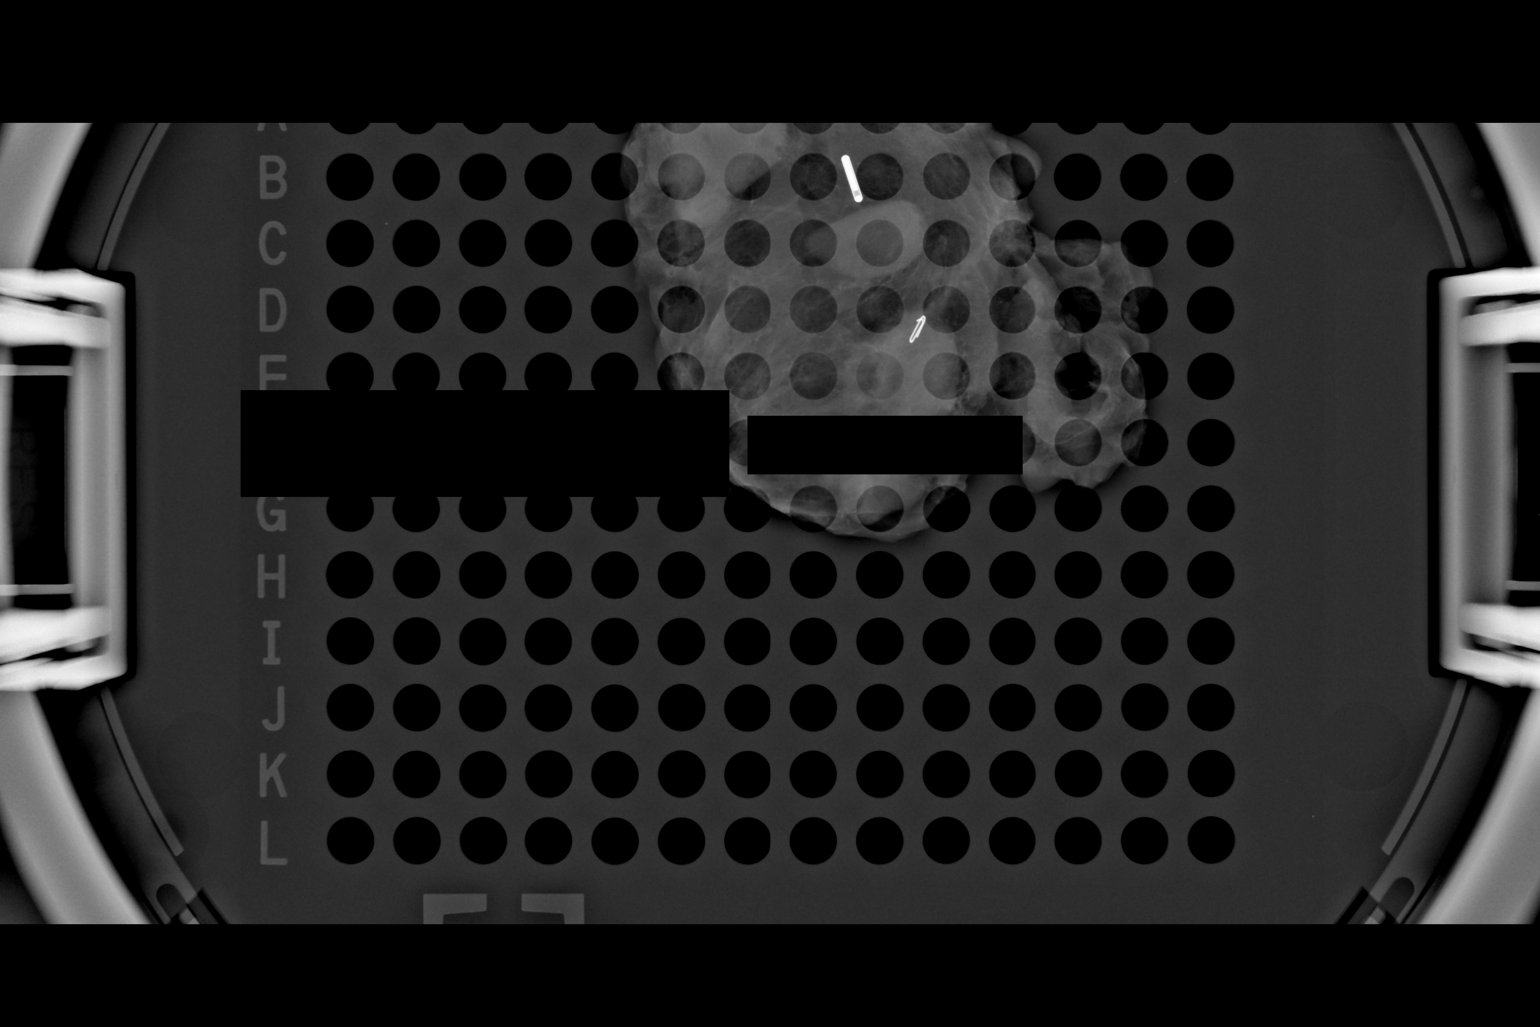

[1 of 1 positions shown; findings below may reference images not displayed]

FINDINGS: Status post excision of the left axilla. The radioactive seed and
biopsy marker clip are present, completely intact, and were marked
for pathology. Findings discussed with the OR staff during the
procedure.
IMPRESSION: Specimen radiograph of the left axilla.

## 2020-12-30 SURGERY — MASTECTOMY WITH AXILLARY LYMPH NODE DISSECTION
Anesthesia: General | Site: Breast | Laterality: Left

## 2020-12-30 MED ORDER — EXEMESTANE 25 MG PO TABS
25.0000 mg | ORAL_TABLET | Freq: Every day | ORAL | Status: DC
  Administered 2020-12-31 – 2021-01-02 (×3): 25 mg via ORAL
  Filled 2020-12-30 (×3): qty 1

## 2020-12-30 MED ORDER — PROPOFOL 10 MG/ML IV BOLUS
INTRAVENOUS | Status: DC | PRN
  Administered 2020-12-30: 150 mg via INTRAVENOUS

## 2020-12-30 MED ORDER — HYDRALAZINE HCL 20 MG/ML IJ SOLN
5.0000 mg | Freq: Once | INTRAMUSCULAR | Status: AC
  Administered 2020-12-30: 5 mg via INTRAVENOUS

## 2020-12-30 MED ORDER — CHLORHEXIDINE GLUCONATE 0.12 % MT SOLN: 15.0000 mL | Freq: Once | OROMUCOSAL | Status: AC

## 2020-12-30 MED ORDER — METHYLENE BLUE 0.5 % INJ SOLN
INTRAVENOUS | Status: AC
  Filled 2020-12-30: qty 10

## 2020-12-30 MED ORDER — FUROSEMIDE 40 MG PO TABS
40.0000 mg | ORAL_TABLET | Freq: Every day | ORAL | Status: DC
  Administered 2020-12-30 – 2021-01-02 (×4): 40 mg via ORAL
  Filled 2020-12-30 (×4): qty 1

## 2020-12-30 MED ORDER — FENTANYL CITRATE (PF) 100 MCG/2ML IJ SOLN
25.0000 ug | INTRAMUSCULAR | Status: AC | PRN
  Administered 2020-12-30 (×6): 25 ug via INTRAVENOUS

## 2020-12-30 MED ORDER — PROCHLORPERAZINE MALEATE 10 MG PO TABS
10.0000 mg | ORAL_TABLET | Freq: Four times a day (QID) | ORAL | Status: DC | PRN
  Filled 2020-12-30: qty 1

## 2020-12-30 MED ORDER — SODIUM CHLORIDE 0.9 % IV SOLN: INTRAVENOUS | Status: DC

## 2020-12-30 MED ORDER — GLUCERNA 1.2 CAL PO LIQD
237.0000 mL | Freq: Three times a day (TID) | ORAL | Status: DC
  Administered 2020-12-31 (×3): 237 mL via ORAL
  Filled 2020-12-30 (×6): qty 237

## 2020-12-30 MED ORDER — INSULIN GLARGINE 100 UNIT/ML ~~LOC~~ SOLN
70.0000 [IU] | Freq: Two times a day (BID) | SUBCUTANEOUS | Status: DC
  Administered 2020-12-30 – 2020-12-31 (×3): 70 [IU] via SUBCUTANEOUS
  Filled 2020-12-30 (×5): qty 0.7

## 2020-12-30 MED ORDER — ORAL CARE MOUTH RINSE: 15.0000 mL | Freq: Once | OROMUCOSAL | Status: AC

## 2020-12-30 MED ORDER — DIPHENOXYLATE-ATROPINE 2.5-0.025 MG PO TABS: 1.0000 | ORAL_TABLET | Freq: Four times a day (QID) | ORAL | Status: DC | PRN

## 2020-12-30 MED ORDER — MIDAZOLAM HCL 5 MG/5ML IJ SOLN
INTRAMUSCULAR | Status: DC | PRN
  Administered 2020-12-30: 2 mg via INTRAVENOUS

## 2020-12-30 MED ORDER — FENTANYL CITRATE (PF) 250 MCG/5ML IJ SOLN
INTRAMUSCULAR | Status: AC
  Filled 2020-12-30: qty 5

## 2020-12-30 MED ORDER — CHLORHEXIDINE GLUCONATE CLOTH 2 % EX PADS: 6.0000 | MEDICATED_PAD | Freq: Once | CUTANEOUS | Status: DC

## 2020-12-30 MED ORDER — ALBUTEROL SULFATE HFA 108 (90 BASE) MCG/ACT IN AERS
1.0000 | INHALATION_SPRAY | RESPIRATORY_TRACT | Status: DC | PRN
  Filled 2020-12-30: qty 6.7

## 2020-12-30 MED ORDER — PROPOFOL 10 MG/ML IV BOLUS
INTRAVENOUS | Status: AC
  Filled 2020-12-30: qty 20

## 2020-12-30 MED ORDER — DEXAMETHASONE SODIUM PHOSPHATE 10 MG/ML IJ SOLN
INTRAMUSCULAR | Status: DC | PRN
  Administered 2020-12-30: 10 mg via INTRAVENOUS

## 2020-12-30 MED ORDER — KETOROLAC TROMETHAMINE 30 MG/ML IJ SOLN
INTRAMUSCULAR | Status: AC
  Filled 2020-12-30: qty 1

## 2020-12-30 MED ORDER — FENTANYL CITRATE (PF) 250 MCG/5ML IJ SOLN
INTRAMUSCULAR | Status: DC | PRN
  Administered 2020-12-30 (×2): 50 ug via INTRAVENOUS

## 2020-12-30 MED ORDER — INSULIN ASPART 100 UNIT/ML ~~LOC~~ SOLN
0.0000 [IU] | Freq: Every day | SUBCUTANEOUS | Status: DC
  Administered 2020-12-30: 5 [IU] via SUBCUTANEOUS

## 2020-12-30 MED ORDER — CARVEDILOL 25 MG PO TABS
25.0000 mg | ORAL_TABLET | Freq: Two times a day (BID) | ORAL | Status: DC
  Administered 2020-12-30 – 2021-01-02 (×6): 25 mg via ORAL
  Filled 2020-12-30 (×6): qty 1

## 2020-12-30 MED ORDER — PHENYLEPHRINE HCL-NACL 10-0.9 MG/250ML-% IV SOLN
INTRAVENOUS | Status: DC | PRN
  Administered 2020-12-30: 20 ug/min via INTRAVENOUS

## 2020-12-30 MED ORDER — LIDOCAINE 2% (20 MG/ML) 5 ML SYRINGE
INTRAMUSCULAR | Status: DC | PRN
  Administered 2020-12-30: 100 mg via INTRAVENOUS

## 2020-12-30 MED ORDER — CEFAZOLIN SODIUM-DEXTROSE 2-4 GM/100ML-% IV SOLN
2.0000 g | INTRAVENOUS | Status: AC
  Administered 2020-12-30: 2 g via INTRAVENOUS
  Filled 2020-12-30: qty 100

## 2020-12-30 MED ORDER — FENTANYL CITRATE (PF) 100 MCG/2ML IJ SOLN
INTRAMUSCULAR | Status: AC
  Filled 2020-12-30: qty 2

## 2020-12-30 MED ORDER — INSULIN ASPART 100 UNIT/ML ~~LOC~~ SOLN
0.0000 [IU] | Freq: Three times a day (TID) | SUBCUTANEOUS | Status: DC
  Administered 2020-12-30: 11 [IU] via SUBCUTANEOUS
  Administered 2020-12-31: 3 [IU] via SUBCUTANEOUS
  Administered 2020-12-31: 2 [IU] via SUBCUTANEOUS
  Administered 2020-12-31: 3 [IU] via SUBCUTANEOUS
  Administered 2021-01-02: 2 [IU] via SUBCUTANEOUS
  Administered 2021-01-02: 3 [IU] via SUBCUTANEOUS

## 2020-12-30 MED ORDER — PANTOPRAZOLE SODIUM 40 MG PO TBEC
40.0000 mg | DELAYED_RELEASE_TABLET | Freq: Every day | ORAL | Status: DC
  Administered 2020-12-30 – 2021-01-02 (×4): 40 mg via ORAL
  Filled 2020-12-30 (×4): qty 1

## 2020-12-30 MED ORDER — KETOROLAC TROMETHAMINE 30 MG/ML IJ SOLN
30.0000 mg | Freq: Once | INTRAMUSCULAR | Status: AC
  Administered 2020-12-30: 30 mg via INTRAVENOUS

## 2020-12-30 MED ORDER — ALPRAZOLAM 0.25 MG PO TABS
0.2500 mg | ORAL_TABLET | Freq: Three times a day (TID) | ORAL | Status: DC | PRN
  Administered 2020-12-30 – 2021-01-01 (×3): 0.5 mg via ORAL
  Administered 2021-01-01: 0.25 mg via ORAL
  Filled 2020-12-30: qty 1
  Filled 2020-12-30 (×3): qty 2

## 2020-12-30 MED ORDER — CYCLOSPORINE 0.05 % OP EMUL
1.0000 [drp] | Freq: Two times a day (BID) | OPHTHALMIC | Status: DC | PRN
  Filled 2020-12-30: qty 1

## 2020-12-30 MED ORDER — HYDRALAZINE HCL 20 MG/ML IJ SOLN
INTRAMUSCULAR | Status: AC
  Filled 2020-12-30: qty 1

## 2020-12-30 MED ORDER — SODIUM CHLORIDE (PF) 0.9 % IJ SOLN
INTRAMUSCULAR | Status: AC
  Filled 2020-12-30: qty 10

## 2020-12-30 MED ORDER — ROCURONIUM BROMIDE 10 MG/ML (PF) SYRINGE
PREFILLED_SYRINGE | INTRAVENOUS | Status: DC | PRN
  Administered 2020-12-30: 50 mg via INTRAVENOUS

## 2020-12-30 MED ORDER — INSULIN GLARGINE 100 UNIT/ML SOLOSTAR PEN: 70.0000 [IU] | PEN_INJECTOR | Freq: Two times a day (BID) | SUBCUTANEOUS | Status: DC

## 2020-12-30 MED ORDER — PHENYLEPHRINE 40 MCG/ML (10ML) SYRINGE FOR IV PUSH (FOR BLOOD PRESSURE SUPPORT)
PREFILLED_SYRINGE | INTRAVENOUS | Status: DC | PRN
  Administered 2020-12-30 (×2): 80 ug via INTRAVENOUS

## 2020-12-30 MED ORDER — ONDANSETRON HCL 4 MG/2ML IJ SOLN
4.0000 mg | Freq: Four times a day (QID) | INTRAMUSCULAR | Status: DC | PRN
Start: 2020-12-30 — End: 2021-01-02

## 2020-12-30 MED ORDER — SUGAMMADEX SODIUM 200 MG/2ML IV SOLN
INTRAVENOUS | Status: DC | PRN
  Administered 2020-12-30: 300 mg via INTRAVENOUS

## 2020-12-30 MED ORDER — MORPHINE SULFATE (PF) 2 MG/ML IV SOLN
2.0000 mg | INTRAVENOUS | Status: DC | PRN
  Administered 2020-12-30: 2 mg via INTRAVENOUS
  Filled 2020-12-30 (×2): qty 1

## 2020-12-30 MED ORDER — EMPAGLIFLOZIN 10 MG PO TABS
10.0000 mg | ORAL_TABLET | Freq: Every day | ORAL | Status: DC
  Administered 2020-12-30 – 2020-12-31 (×2): 10 mg via ORAL
  Filled 2020-12-30 (×4): qty 1

## 2020-12-30 MED ORDER — ZOLPIDEM TARTRATE 5 MG PO TABS
5.0000 mg | ORAL_TABLET | Freq: Every evening | ORAL | Status: DC | PRN
  Administered 2020-12-30 – 2020-12-31 (×2): 5 mg via ORAL
  Filled 2020-12-30 (×2): qty 1

## 2020-12-30 MED ORDER — ACETAMINOPHEN 500 MG PO TABS
1000.0000 mg | ORAL_TABLET | ORAL | Status: AC
  Administered 2020-12-30: 1000 mg via ORAL
  Filled 2020-12-30: qty 2

## 2020-12-30 MED ORDER — DIPHENHYDRAMINE HCL 12.5 MG/5ML PO ELIX: 12.5000 mg | ORAL_SOLUTION | Freq: Four times a day (QID) | ORAL | Status: DC | PRN

## 2020-12-30 MED ORDER — GABAPENTIN 400 MG PO CAPS
400.0000 mg | ORAL_CAPSULE | Freq: Three times a day (TID) | ORAL | Status: DC
  Administered 2020-12-30 – 2021-01-02 (×10): 400 mg via ORAL
  Filled 2020-12-30 (×10): qty 1

## 2020-12-30 MED ORDER — ONDANSETRON HCL 4 MG/2ML IJ SOLN
INTRAMUSCULAR | Status: DC | PRN
  Administered 2020-12-30: 4 mg via INTRAVENOUS

## 2020-12-30 MED ORDER — POTASSIUM CHLORIDE CRYS ER 20 MEQ PO TBCR
20.0000 meq | EXTENDED_RELEASE_TABLET | Freq: Every day | ORAL | Status: DC
  Administered 2020-12-30 – 2021-01-02 (×4): 20 meq via ORAL
  Filled 2020-12-30 (×4): qty 1

## 2020-12-30 MED ORDER — LOSARTAN POTASSIUM 50 MG PO TABS
100.0000 mg | ORAL_TABLET | Freq: Every day | ORAL | Status: DC
  Administered 2020-12-30 – 2021-01-02 (×4): 100 mg via ORAL
  Filled 2020-12-30 (×4): qty 2

## 2020-12-30 MED ORDER — CHLORHEXIDINE GLUCONATE 0.12 % MT SOLN
OROMUCOSAL | Status: AC
  Administered 2020-12-30: 15 mL via OROMUCOSAL
  Filled 2020-12-30: qty 15

## 2020-12-30 MED ORDER — TRAMADOL HCL 50 MG PO TABS
50.0000 mg | ORAL_TABLET | Freq: Four times a day (QID) | ORAL | Status: DC | PRN
Start: 2020-12-30 — End: 2021-01-02

## 2020-12-30 MED ORDER — SPIRONOLACTONE 25 MG PO TABS
25.0000 mg | ORAL_TABLET | Freq: Every day | ORAL | Status: DC
  Administered 2020-12-30 – 2021-01-02 (×4): 25 mg via ORAL
  Filled 2020-12-30 (×4): qty 1

## 2020-12-30 MED ORDER — ENOXAPARIN SODIUM 40 MG/0.4ML ~~LOC~~ SOLN
40.0000 mg | SUBCUTANEOUS | Status: DC
  Administered 2020-12-31 – 2021-01-02 (×3): 40 mg via SUBCUTANEOUS
  Filled 2020-12-30 (×3): qty 0.4

## 2020-12-30 MED ORDER — GLIPIZIDE ER 5 MG PO TB24
5.0000 mg | ORAL_TABLET | Freq: Every day | ORAL | Status: DC
  Administered 2020-12-31: 5 mg via ORAL
  Filled 2020-12-30 (×4): qty 1

## 2020-12-30 MED ORDER — METHOCARBAMOL 500 MG PO TABS: 500.0000 mg | ORAL_TABLET | Freq: Three times a day (TID) | ORAL | Status: DC | PRN

## 2020-12-30 MED ORDER — ONDANSETRON 4 MG PO TBDP: 4.0000 mg | ORAL_TABLET | Freq: Four times a day (QID) | ORAL | Status: DC | PRN

## 2020-12-30 MED ORDER — CEFAZOLIN SODIUM-DEXTROSE 2-4 GM/100ML-% IV SOLN
2.0000 g | Freq: Three times a day (TID) | INTRAVENOUS | Status: AC
  Administered 2020-12-30: 2 g via INTRAVENOUS
  Filled 2020-12-30: qty 100

## 2020-12-30 MED ORDER — ACETAMINOPHEN 500 MG PO TABS
1000.0000 mg | ORAL_TABLET | Freq: Four times a day (QID) | ORAL | Status: DC
  Administered 2020-12-30 – 2021-01-02 (×12): 1000 mg via ORAL
  Filled 2020-12-30 (×13): qty 2

## 2020-12-30 MED ORDER — 0.9 % SODIUM CHLORIDE (POUR BTL) OPTIME
TOPICAL | Status: DC | PRN
  Administered 2020-12-30: 1000 mL

## 2020-12-30 MED ORDER — MIDAZOLAM HCL 2 MG/2ML IJ SOLN
INTRAMUSCULAR | Status: AC
  Filled 2020-12-30: qty 2

## 2020-12-30 MED ORDER — LACTATED RINGERS IV SOLN: INTRAVENOUS | Status: DC

## 2020-12-30 MED ORDER — DIPHENHYDRAMINE HCL 50 MG/ML IJ SOLN: 12.5000 mg | Freq: Four times a day (QID) | INTRAMUSCULAR | Status: DC | PRN

## 2020-12-30 MED ORDER — OXYCODONE HCL 5 MG PO TABS
5.0000 mg | ORAL_TABLET | ORAL | Status: DC | PRN
  Administered 2020-12-30 (×2): 5 mg via ORAL
  Filled 2020-12-30 (×2): qty 1

## 2020-12-30 MED ORDER — METFORMIN HCL 500 MG PO TABS
500.0000 mg | ORAL_TABLET | Freq: Two times a day (BID) | ORAL | Status: DC
  Administered 2020-12-30 – 2020-12-31 (×3): 500 mg via ORAL
  Filled 2020-12-30 (×5): qty 1

## 2020-12-30 SURGICAL SUPPLY — 46 items
APPLIER CLIP 9.375 MED OPEN (MISCELLANEOUS) ×2
BENZOIN TINCTURE PRP APPL 2/3 (GAUZE/BANDAGES/DRESSINGS) ×4 IMPLANT
BINDER BREAST LRG (GAUZE/BANDAGES/DRESSINGS) IMPLANT
BINDER BREAST XLRG (GAUZE/BANDAGES/DRESSINGS) IMPLANT
CANISTER SUCT 3000ML PPV (MISCELLANEOUS) ×2 IMPLANT
CHLORAPREP W/TINT 26 (MISCELLANEOUS) ×2 IMPLANT
CLIP APPLIE 9.375 MED OPEN (MISCELLANEOUS) ×1 IMPLANT
CNTNR URN SCR LID CUP LEK RST (MISCELLANEOUS) ×1 IMPLANT
CONT SPEC 4OZ STRL OR WHT (MISCELLANEOUS) ×1
COVER PROBE W GEL 5X96 (DRAPES) ×2 IMPLANT
COVER SURGICAL LIGHT HANDLE (MISCELLANEOUS) ×2 IMPLANT
COVER WAND RF STERILE (DRAPES) ×2 IMPLANT
DRAIN CHANNEL 19F RND (DRAIN) ×2 IMPLANT
DRAPE CHEST BREAST 15X10 FENES (DRAPES) ×2 IMPLANT
DRSG PAD ABDOMINAL 8X10 ST (GAUZE/BANDAGES/DRESSINGS) ×4 IMPLANT
ELECT BLADE 4.0 EZ CLEAN MEGAD (MISCELLANEOUS) ×2
ELECT CAUTERY BLADE 6.4 (BLADE) ×2 IMPLANT
ELECT REM PT RETURN 9FT ADLT (ELECTROSURGICAL) ×2
ELECTRODE BLDE 4.0 EZ CLN MEGD (MISCELLANEOUS) ×1 IMPLANT
ELECTRODE REM PT RTRN 9FT ADLT (ELECTROSURGICAL) ×1 IMPLANT
EVACUATOR SILICONE 100CC (DRAIN) ×2 IMPLANT
GAUZE SPONGE 4X4 12PLY STRL (GAUZE/BANDAGES/DRESSINGS) ×4 IMPLANT
GLOVE BIO SURGEON STRL SZ7 (GLOVE) ×2 IMPLANT
GLOVE BIOGEL PI IND STRL 7.5 (GLOVE) ×1 IMPLANT
GLOVE BIOGEL PI INDICATOR 7.5 (GLOVE) ×1
GOWN STRL REUS W/ TWL LRG LVL3 (GOWN DISPOSABLE) ×2 IMPLANT
GOWN STRL REUS W/TWL LRG LVL3 (GOWN DISPOSABLE) ×2
KIT BASIN OR (CUSTOM PROCEDURE TRAY) ×2 IMPLANT
KIT TURNOVER KIT B (KITS) ×2 IMPLANT
NEEDLE 18GX1X1/2 (RX/OR ONLY) (NEEDLE) ×2 IMPLANT
NEEDLE FILTER BLUNT 18X 1/2SAF (NEEDLE) ×1
NEEDLE FILTER BLUNT 18X1 1/2 (NEEDLE) ×1 IMPLANT
NEEDLE HYPO 25GX1X1/2 BEV (NEEDLE) ×2 IMPLANT
NS IRRIG 1000ML POUR BTL (IV SOLUTION) ×2 IMPLANT
PACK GENERAL/GYN (CUSTOM PROCEDURE TRAY) ×2 IMPLANT
PAD ARMBOARD 7.5X6 YLW CONV (MISCELLANEOUS) ×2 IMPLANT
PENCIL SMOKE EVACUATOR (MISCELLANEOUS) ×2 IMPLANT
SPECIMEN JAR X LARGE (MISCELLANEOUS) ×2 IMPLANT
STAPLER VISISTAT 35W (STAPLE) ×2 IMPLANT
STRIP CLOSURE SKIN 1/2X4 (GAUZE/BANDAGES/DRESSINGS) ×4 IMPLANT
SUT ETHILON 2 0 FS 18 (SUTURE) ×2 IMPLANT
SUT MNCRL AB 4-0 PS2 18 (SUTURE) ×2 IMPLANT
SUT VIC AB 3-0 SH 18 (SUTURE) ×2 IMPLANT
SYR CONTROL 10ML LL (SYRINGE) ×2 IMPLANT
TOWEL GREEN STERILE (TOWEL DISPOSABLE) ×2 IMPLANT
TOWEL GREEN STERILE FF (TOWEL DISPOSABLE) ×2 IMPLANT

## 2020-12-30 NOTE — Anesthesia Procedure Notes (Signed)
Procedure Name: Intubation Date/Time: 12/30/2020 8:41 AM Performed by: Amadeo Garnet, CRNA Pre-anesthesia Checklist: Patient identified, Emergency Drugs available, Suction available and Patient being monitored Patient Re-evaluated:Patient Re-evaluated prior to induction Oxygen Delivery Method: Circle system utilized Preoxygenation: Pre-oxygenation with 100% oxygen Induction Type: IV induction Ventilation: Mask ventilation without difficulty and Oral airway inserted - appropriate to patient size Laryngoscope Size: Mac and 3 Grade View: Grade I Tube type: Oral Tube size: 7.0 mm Number of attempts: 1 Airway Equipment and Method: Stylet and Oral airway Placement Confirmation: ETT inserted through vocal cords under direct vision,  positive ETCO2 and breath sounds checked- equal and bilateral Secured at: 22 cm Tube secured with: Tape Dental Injury: Teeth and Oropharynx as per pre-operative assessment

## 2020-12-30 NOTE — Op Note (Signed)
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 Surgeon:Gleason Ardoin K Shaterica Mcclatchy Assistant:  Carlena Hurl, PA-C Anesthesia: General with pectoralis block Indications:This is a 70 y.o. female seen in the multidisciplinary breast clinic for a new diagnosis of left breast cancer. The patient was noted to have screening detected calcifications and abnormal appearing lymph nodes. Closer examination revealed a large firm palpable mass in the upper outer quadrant. She underwent further diagnostic work-up which revealed a 12 cm span of highly suspicious calcifications in the upper outer and upper inner quadrants of the left breast. Wiithin this area by ultrasound there are confluent masses at the 1 o'clock position spanning at least 4.5 cm and an additional suspicious mass in the left breast at 2:00 was identified. This location was 7 cm from the nipple and measured 9 mm in greatest dimension. There were 2 left axillary lymph nodes that had cortical thickening and additional lymph node with a borderline cortex of 4 mm, totaling approximately 3 abnormal appearing nodes. She underwent a biopsy on 07/16/2020. 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 A second biopsy of another region of the upper inner quadrant also revealed IDC.  MRI performed 07/31/20 showed extensive non-mass enhancement in the left breast involving the upper outer lower-outer lower inner quadrants extending towards the chest wall with 3 enlarged lymph nodes in the left axilla. This included the  biopsied lymph node in the axilla. She had an incidental finding of a small lesion in the right breast upper inner quadrant that underwent MR biopsy. This revealed a papillary lesion. The patient underwent port placement and neoadjuvant chemotherapy. She tolerated chemotherapy poorly and so her medications were switched. She is currently only on immunotherapy. She had a follow-up MRI on 12/04/20 that showed significant improvement in the left breast with several scattered remaining areas of non-mass enhancement. The lower-outer quadrant measures 4.4 cm the upper outer quadrant measures 3.2 cm, and the upper outer quadrant has another area measuring 2.7 cm. The 3 enlarged lymph nodes are all smaller than previously measured. The right breast shows no areas of any enhancing mass or non-mass enhancement.  Description of procedure: The patient is brought to the operating room and placed in the supine position on the operating room table.  2 radioactive seeds had been placed yesterday in her left axilla by radiology.  After an adequate level of general anesthesia was obtained, her left chest and axilla were prepped with ChloraPrep and draped in sterile fashion.  A timeout was taken to ensure the proper patient and proper procedure.  I interrogated the axilla with the neoprobe.  There are 2 separate areas of radioactivity.  I outlined an elliptical incision to include the nipple areolar complex.  We made our superior incision and raised superior skin flaps to the edge of the sternum medially, the infraclavicular chest wall superiorly, and the anterior edge of the latissimus laterally.  We then made our inferior incision and took our skin flaps down to the inframammary crease.  We then dissected the breast tissue off of the chest wall from medial to lateral.  As we near the axilla I  interrogated the axilla with the neoprobe.  There is a seed that is more anterior.  We dissected to this area.  We removed a cluster  of lymph nodes including the anterior radioactive seed and sent this as a separate specimen.  Specimen mammogram showed a biopsy clip as well as a radioactive seed within this cluster of lymph nodes.  The mastectomy specimen was removed and was oriented with a suture lateral.  This was also sent to pathology.  We then continued dissecting more superiorly into the axilla.  We had identified the area of remaining radioactivity.  We dissected a few lymph nodes from this area.  Specimen mammogram confirmed the presence of the more posterior seed.  We inspected carefully for hemostasis.  The entire wound was thoroughly irrigated.  A 19 French drain was brought in through the stab incision and secured with 2-0 Ethilon.  3-0 Vicryl sutures were used to close the subcutaneous tissues.  A few sutures were used to tack the skin flaps down to the chest wall.  4-0 Monocryl was used to close the skin.  Benzoin and Steri-Strips were applied.  The patient was then extubated and brought to the recovery room in stable condition.  All sponge, instrument, and needle counts are correct.  Imogene Burn. [name], MD, [facility]  General/ Trauma Surgery   12/30/2020 10:37 AM

## 2020-12-30 NOTE — Interval H&P Note (Signed)
History and Physical Interval Note:  12/30/2020 6:47 AM  Cheryl Blankenship  has presented today for surgery, with the diagnosis of LEFT BREAST INVASIVE DUCTAL CARCINOMA WITH AXILLARY METASTASES.  The various methods of treatment have been discussed with the patient and family. After consideration of risks, benefits and other options for treatment, the patient has consented to  Procedure(s): LEFT MASTECTOMY WITH TARGETED AXILLARY [place] (Left) as a surgical intervention.  The patient's history has been reviewed, patient examined, no change in status, stable for surgery.  I have reviewed the patient's chart and labs.  Questions were answered to the patient's satisfaction.     [name]

## 2020-12-30 NOTE — Anesthesia Procedure Notes (Signed)
Anesthesia Regional Block: Pectoralis block   Pre-Anesthetic Checklist: ,, timeout performed, Correct Patient, Correct Site, Correct Laterality, Correct Procedure, Correct Position, site marked, Risks and benefits discussed,  Surgical consent,  Pre-op evaluation,  At surgeon's request and post-op pain management  Laterality: Left  Prep: chloraprep       Needles:  Injection technique: Single-shot  Needle Type: Echogenic Needle          Additional Needles:   Procedures: Doppler guided,,,, ultrasound used (permanent image in chart),,,,  Narrative:  Start time: 12/30/2020 8:00 AM End time: 12/30/2020 8:15 AM  Performed by: Personally  Anesthesiologist: Belinda Block, MD

## 2020-12-30 NOTE — Anesthesia Postprocedure Evaluation (Signed)
Anesthesia Post Note  Patient: Cheryl Blankenship  Procedure(s) Performed: LEFT MASTECTOMY WITH TARGETED AXILLARY LYMPH NODE DISSECTION (Left Breast)     Patient location during evaluation: PACU Anesthesia Type: General Level of consciousness: awake Pain management: pain level controlled Vital Signs Assessment: post-procedure vital signs reviewed and stable Respiratory status: spontaneous breathing Cardiovascular status: stable Postop Assessment: no apparent nausea or vomiting Anesthetic complications: no   No complications documented.  Last Vitals:  Vitals:   12/30/20 0643 12/30/20 1042  BP: (!) 174/121   Pulse: 68   Resp: 18   Temp: 37.1 C 36.4 C  SpO2: 99% 99%    Last Pain:  Vitals:   12/30/20 1042  TempSrc:   PainSc: Asleep                 Aseneth Hack

## 2020-12-30 NOTE — Progress Notes (Signed)
Dr.[name] notified of patient new onset of chest pain.EKG ordered and completed. Patient medicated with PRN pain medication. No new orders at this time

## 2020-12-30 NOTE — Transfer of Care (Signed)
Immediate Anesthesia Transfer of Care Note  Patient: Cheryl Blankenship  Procedure(s) Performed: LEFT MASTECTOMY WITH TARGETED AXILLARY LYMPH NODE DISSECTION (Left Breast)  Patient Location: PACU  Anesthesia Type:GA combined with regional for post-op pain  Level of Consciousness: awake, alert  and oriented  Airway & Oxygen Therapy: Patient Spontanous Breathing and Patient connected to face mask oxygen  Post-op Assessment: Report given to RN, Post -op Vital signs reviewed and stable and Patient moving all extremities  Post vital signs: Reviewed and stable  Last Vitals:  Vitals Value Taken Time  BP 194/123 12/30/20 1045  Temp 36.4 C 12/30/20 1042  Pulse 64 12/30/20 1047  Resp 13 12/30/20 1047  SpO2 100 % 12/30/20 1047  Vitals shown include unvalidated device data.  Last Pain:  Vitals:   12/30/20 1042  TempSrc:   PainSc: Asleep         Complications: No complications documented.

## 2020-12-30 NOTE — Progress Notes (Addendum)
MD [facility] notified of patient's continued pain with no relief despite administration of Fentanyl. Verbal order to give 30 mg IV Toradol, when this RN put the order in, a warning popped up stating that 30 mg is over recommended limit for this patient. MD notified and stated that it is okay to go ahead and give 30 mg.

## 2020-12-31 ENCOUNTER — Encounter (HOSPITAL_COMMUNITY): Payer: Self-pay | Admitting: Surgery

## 2020-12-31 ENCOUNTER — Other Ambulatory Visit: Payer: Medicare HMO

## 2020-12-31 ENCOUNTER — Ambulatory Visit: Payer: Medicare HMO

## 2020-12-31 ENCOUNTER — Ambulatory Visit: Payer: Medicare HMO | Admitting: Nurse Practitioner

## 2020-12-31 DIAGNOSIS — D0512 Intraductal carcinoma in situ of left breast: Secondary | ICD-10-CM | POA: Diagnosis not present

## 2020-12-31 LAB — CBC
HCT: 32.9 % — ABNORMAL LOW (ref 36.0–46.0)
Hemoglobin: 10.4 g/dL — ABNORMAL LOW (ref 12.0–15.0)
MCH: 27.7 pg (ref 26.0–34.0)
MCHC: 31.6 g/dL (ref 30.0–36.0)
MCV: 87.5 fL (ref 80.0–100.0)
Platelets: 245 10*3/uL (ref 150–400)
RBC: 3.76 MIL/uL — ABNORMAL LOW (ref 3.87–5.11)
RDW: 13.2 % (ref 11.5–15.5)
WBC: 8.2 10*3/uL (ref 4.0–10.5)
nRBC: 0 % (ref 0.0–0.2)

## 2020-12-31 LAB — BASIC METABOLIC PANEL
Anion gap: 8 (ref 5–15)
BUN: 36 mg/dL — ABNORMAL HIGH (ref 8–23)
CO2: 26 mmol/L (ref 22–32)
Calcium: 9.8 mg/dL (ref 8.9–10.3)
Chloride: 101 mmol/L (ref 98–111)
Creatinine, Ser: 1.62 mg/dL — ABNORMAL HIGH (ref 0.44–1.00)
GFR, Estimated: 35 mL/min — ABNORMAL LOW (ref >60)
Glucose, Bld: 247 mg/dL — ABNORMAL HIGH (ref 70–99)
Potassium: 4.5 mmol/L (ref 3.5–5.1)
Sodium: 135 mmol/L (ref 135–145)

## 2020-12-31 LAB — GLUCOSE, CAPILLARY
Glucose-Capillary: 162 mg/dL — ABNORMAL HIGH (ref 70–99)
Glucose-Capillary: 197 mg/dL — ABNORMAL HIGH (ref 70–99)
Glucose-Capillary: 131 mg/dL — ABNORMAL HIGH (ref 70–99)
Glucose-Capillary: 135 mg/dL — ABNORMAL HIGH (ref 70–99)

## 2020-12-31 MED ORDER — OXYCODONE HCL 5 MG PO TABS
5.0000 mg | ORAL_TABLET | Freq: Four times a day (QID) | ORAL | Status: DC | PRN
  Administered 2020-12-31 (×2): 5 mg via ORAL
  Filled 2020-12-31 (×3): qty 1

## 2020-12-31 MED ORDER — DOCUSATE SODIUM 100 MG PO CAPS
100.0000 mg | ORAL_CAPSULE | Freq: Two times a day (BID) | ORAL | Status: DC
  Administered 2020-12-31 – 2021-01-02 (×4): 100 mg via ORAL
  Filled 2020-12-31 (×4): qty 1

## 2020-12-31 NOTE — Progress Notes (Signed)
Inpatient Diabetes Program Recommendations  AACE/ADA: New Consensus Statement on Inpatient Glycemic Control (2015)  Target Ranges:  Prepandial:   less than 140 mg/dL      Peak postprandial:   less than 180 mg/dL (1-2 hours)      Critically ill patients:  140 - 180 mg/dL   Results for ASA, FATH (MRN [id number]) as of 12/31/2020 11:29  Ref. Range 12/30/2020 06:28 12/30/2020 10:41 12/30/2020 16:43 12/30/2020 21:29 12/31/2020 07:51  Glucose-Capillary Latest Ref Range: 70 - 99 mg/dL 187 (H) 147 (H) 318 (H) 322 (H) 162 (H)  Results for ERIONNA, STRUM (MRN [id number]) as of 12/31/2020 11:29  Ref. Range 10/30/2020 15:13 12/30/2020 14:38  Hemoglobin A1C Latest Ref Range: 4.8 - 5.6 % 10.3 (H) 10.1 (H)   Review of Glycemic Control  Diabetes history: DM2 Outpatient Diabetes medications: Lantus 70 units BID, Novolog 2-6 units TID, Jardiance 10 mg daily, Glipizide XL 5 mg QAM, Metformin 500 mg BID Current orders for Inpatient glycemic control:  Lantus 70 units BID, Novolog 0-15 units TID with meals, Novolog 0-5 units QHS, Jardiance 10 mg daily, Metformin 500 mg BID, Glipizide XL 5 mg QAM  Inpatient Diabetes Program Recommendations:    HbgA1C:A1C 10.1% on 12/30/20 indicating an average glucose of 243 mg/dl over the past 2-3 months.   NOTE: Spoke with patient over the phone about diabetes and home regimen for diabetes control. Patient reports being followed by PCP for diabetes management and currently taking DM medications as listed above. Patient reports had been running high at home but recently improved.  Inquired about prior A1C and patient reports last A1C about 1 month ago was in 8% range. Discussed A1C results (10.1% on 12/30/20) and explained that current A1C indicates an average glucose of 243 mg/dl over the past 2-3 months. Patient was very surprised that A1C had gone back up.  Patient reports that she has been under a lot of stress due to dx of breast cancer, chemo treatments, and she states she  has also been on steroids. Discussed impact of steroids and stress on DM control.  Discussed glucose and A1C goals. Discussed importance of checking CBGs and maintaining good CBG control to prevent long-term and short-term complications especially following surgery.  Encouraged patient to monitor glucose frequently at home and to contact PCP if glucose is consistently over 180 mg/dl so adjustments with DM medication could be made if needed. Patient states she would like a prescription for a new glucometer at discharge and reports she has all DM medication and supplies.  Patient verbalized understanding of information discussed and reports no further questions at this time related to diabetes. At time of discharge, please provide Rx for glucose monitoring kit (#[id number]).  Thanks, Barnie Alderman, RN, MSN, CDE Diabetes Coordinator Inpatient Diabetes Program [phone] (Team Pager)

## 2020-12-31 NOTE — Progress Notes (Signed)
1 Day Post-Op   Subjective/Chief Complaint: Patient with some right chest soreness Tolerating diet    Objective: Vital signs in last 24 hours: Temp:  [97.5 F (36.4 C)-98.3 F (36.8 C)] 98.2 F (36.8 C) (04/14 0628) Pulse Rate:  [60-79] 60 (04/14 0628) Resp:  [10-19] 17 (04/14 0628) BP: (106-197)/(61-96) 118/63 (04/14 0628) SpO2:  [93 %-100 %] 94 % (04/14 0628) Weight:  [93.4 kg] 93.4 kg (04/13 1331)    Intake/Output from previous day: 04/13 0701 - 04/14 0700 In: 2340 [P.O.:1140; I.V.:1100; IV Piggyback:100] Out: 565 [Urine:450; Drains:95; Blood:20] Intake/Output this shift: Total I/O In: -  Out: 600 [Urine:600]  General appearance: alert, cooperative and no distress Left chest - flaps viable; no hematoma; serosanguinous drainage in drain bulb  Lab Results:  Recent Labs    12/30/20 0710 12/30/20 0801 12/31/20 0315  WBC 8.0  --  8.2  HGB 12.0 11.9* 10.4*  HCT 37.8 35.0* 32.9*  PLT 285  --  245   BMET Recent Labs    12/30/20 0710 12/30/20 0801 12/31/20 0315  NA 138 140 135  K 3.7 3.7 4.5  CL 100 101 101  CO2 30  --  26  GLUCOSE 194* 179* 247*  BUN 30* 32* 36*  CREATININE 1.19* 1.20* 1.62*  CALCIUM 10.7*  --  9.8   PT/INR No results for input(s): LABPROT, INR in the last 72 hours. ABG No results for input(s): PHART, HCO3 in the last 72 hours.  Invalid input(s): PCO2, PO2  Studies/Results: MM BREAST SURGICAL SPECIMEN  Result Date: 12/30/2020 CLINICAL DATA:  Status post targeted axillary lymph node dissection today after earlier radioactive seed localization. EXAM: SPECIMEN RADIOGRAPH OF THE LEFT AXILLA COMPARISON:  Previous exam(s). FINDINGS: Status post excision of the LEFT axilla. The radioactive seed is present, completely intact, and were marked for pathology. IMPRESSION: Specimen radiograph of the left axilla. Electronically Signed   By: Franki Cabot M.D.   On: 12/30/2020 10:05   MM Breast Surgical Specimen  Result Date: 12/30/2020 CLINICAL  DATA:  Status post targeted lymph node dissection today after earlier radioactive seed localization. EXAM: SPECIMEN RADIOGRAPH OF THE LEFT BREAST COMPARISON:  Previous exam(s). FINDINGS: Status post excision of the left axilla. The radioactive seed and biopsy marker clip are present, completely intact, and were marked for pathology. Findings discussed with the OR staff during the procedure. IMPRESSION: Specimen radiograph of the left axilla. Electronically Signed   By: Franki Cabot M.D.   On: 12/30/2020 09:45   MM LT RADIOACTIVE SEED LOC MAMMO GUIDE  Result Date: 12/29/2020 CLINICAL DATA:  Patient is scheduled for targeted LEFT axillary lymph node dissection tomorrow. Ultrasound-guided radioactive seed placement was performed earlier today. Subsequent postprocedural mammogram showed that the radioactive seed was placed into a lymph node which did not correspond to the lymph node biopsied on 07/16/2020 (containing a heart shaped clip). Now, using mammographic guidance, attempt will be made to place a radioactive seed into the lymph node which contains the heart shaped clip. EXAM: MAMMOGRAPHIC GUIDED RADIOACTIVE SEED LOCALIZATION OF THE LEFT AXILLA COMPARISON:  Previous exam(s). FINDINGS: Patient presents for radioactive seed localization prior to targeted lymph node dissection. I met with the patient and we discussed the procedure of seed localization including benefits and alternatives. We discussed the high likelihood of a successful procedure. We discussed the risks of the procedure including infection, bleeding, tissue injury and further surgery. We discussed the low dose of radioactivity involved in the procedure. Informed, written consent was given. The usual  time-out protocol was performed immediately prior to the procedure. Using mammographic guidance, sterile technique, 1% lidocaine and an I-125 radioactive seed, the heart shaped clip within the lower LEFT axilla was localized using a superior approach.  The follow-up mammogram images confirm the seed in the expected location and were marked for Dr. [name]. Follow-up survey of the patient confirms presence of the radioactive seed. Order number of I-125 seed:  [id number]. Total activity:  2.542 millicuries reference Date: 12/21/2020 The patient tolerated the procedure well and was released from the [facility]. She was given instructions regarding seed removal. IMPRESSION: Radioactive seed localization left breast. No apparent complications. There are now 2 radioactive seeds within 2 separate lymph nodes in the LEFT axilla. This was discussed with Dr. [name] on 12/29/2020 at 3:40 p.m. Electronically Signed   By: Franki Cabot M.D.   On: 12/29/2020 15:49   [place] LT RADIOACTIVE SEED LOC  Result Date: 12/29/2020 CLINICAL DATA:  Patient is scheduled for targeted LEFT axillary lymph node dissection requiring preoperative radioactive seed localization. EXAM: ULTRASOUND GUIDED RADIOACTIVE SEED LOCALIZATION OF THE LEFT AXILLA COMPARISON:  Previous exam(s). FINDINGS: Patient presents for radioactive seed localization prior to targeted lymph node dissection. I met with the patient and we discussed the procedure of seed localization including benefits and alternatives. We discussed the high likelihood of a successful procedure. We discussed the risks of the procedure including infection, bleeding, tissue injury and further surgery. We discussed the low dose of radioactivity involved in the procedure. Informed, written consent was given. The usual time-out protocol was performed immediately prior to the procedure. Using ultrasound guidance, sterile technique, 1% lidocaine and an I-125 radioactive seed, the axillary lymph node which appeared to contain a biopsy clip was localized using a lateral approach. The follow-up mammogram images confirm the seed to be within a different lymph node than the originally biopsied lymph node, and this was marked for Dr. [name]. Follow-up survey  of the patient confirms presence of the radioactive seed. Order number of I-125 seed:  [id number]. Total activity:  3.151 millicuries reference Date: 12/21/2020 The patient tolerated the procedure well and was released from the [facility]. She was given instructions regarding seed removal. IMPRESSION: 1. Radioactive seed localization left axilla. No apparent complications. 2. Postprocedure mammogram shows that this radioactive seed placement is within a morphologically abnormal lymph node does not correspond to the originally biopsied lymph node. As such, additional attempt will be made to place a second (separate) radioactive seed into the lymph node containing the heart shaped clip using mammographic guidance. Additional procedure report to follow. Electronically Signed   By: Franki Cabot M.D.   On: 12/29/2020 15:29   MM CLIP PLACEMENT LEFT  Result Date: 12/29/2020 CLINICAL DATA:  Status post ultrasound-guided radioactive seed placement. Patient is scheduled for a targeted LEFT axillary lymph node dissection tomorrow. EXAM: DIAGNOSTIC LEFT MAMMOGRAM POST ULTRASOUND-GUIDED RADIOACTIVE SEED PLACEMENT COMPARISON:  Previous exam(s). FINDINGS: Mammographic images were obtained following ultrasound-guided radioactive seed placement. These demonstrate that the radioactive seed has been placed into a different morphologically abnormal lymph node than the lymph node which was biopsied on 07/16/2020. IMPRESSION: Radioactive seed placed today using ultrasound guidance is located within a different morphologically abnormal lymph node than the lymph node which was biopsied on 07/16/2020. As such, an additional radioactive seed will be placed into the lymph node containing the heart shaped clip using mammographic guidance. Final Assessment: Post Procedure Mammograms for Seed Placement Electronically Signed   By: Roxy Horseman.D.  On: 12/29/2020 15:42    Anti-infectives: Anti-infectives (From admission, onward)    Start     Dose/Rate Route Frequency Ordered Stop   12/30/20 1700  ceFAZolin (ANCEF) IVPB 2g/100 mL premix        2 g 200 mL/hr over 30 Minutes Intravenous Every 8 hours 12/30/20 1320 12/30/20 1809   12/30/20 0630  ceFAZolin (ANCEF) IVPB 2g/100 mL premix        2 g 200 mL/hr over 30 Minutes Intravenous On call to O.R. 12/30/20 0617 12/30/20 0855      Assessment/Plan: s/p Procedure(s): LEFT MASTECTOMY WITH TARGETED AXILLARY LYMPH NODE DISSECTION (Left) Adjust pain meds.  Possible discharge later today.  LOS: 1 day    Cheryl Blankenship 12/31/2020

## 2020-12-31 NOTE — Discharge Instructions (Signed)
[facility] °[phone] ° °MASTECTOMY: POST OP INSTRUCTIONS ° °Always review your discharge instruction sheet given to you by the facility where your surgery was performed. °IF YOU HAVE DISABILITY OR FAMILY LEAVE FORMS, YOU MUST BRING THEM TO THE OFFICE FOR PROCESSING.   °DO NOT GIVE THEM TO YOUR DOCTOR. °A prescription for pain medication may be given to you upon discharge.  Take your pain medication as prescribed, if needed.  If narcotic pain medicine is not needed, then you may take acetaminophen (Tylenol) or ibuprofen (Advil) as needed. °1. Take your usually prescribed medications unless otherwise directed. °2. If you need a refill on your pain medication, please contact your pharmacy.  They will contact our office to request authorization.  Prescriptions will not be filled after 5pm or on week-ends. °3. You should follow a light diet the first few days after arrival home, such as soup and crackers, etc.  Resume your normal diet the day after surgery. °4. Most patients will experience some swelling and bruising on the chest and underarm.  Ice packs will help.  Swelling and bruising can take several days to resolve.  °5. It is common to experience some constipation if taking pain medication after surgery.  Increasing fluid intake and taking a stool softener (such as Colace) will usually help or prevent this problem from occurring.  A mild laxative (Milk of Magnesia or Miralax) should be taken according to package instructions if there are no bowel movements after 48 hours. °6. Unless discharge instructions indicate otherwise, leave your bandage dry and in place until your next appointment in 3-5 days.  You may take a limited sponge bath.  No tube baths or showers until the drains are removed.  You may have steri-strips (small skin tapes) in place directly over the incision.  These strips should be left on the skin for 7-10 days.  If your surgeon used skin glue on the incision, you may  shower in 24 hours.  The glue will flake off over the next 2-3 weeks.  Any sutures or staples will be removed at the office during your follow-up visit. °7. DRAINS:  If you have drains in place, it is important to keep a list of the amount of drainage produced each day in your drains.  Before leaving the hospital, you should be instructed on drain care.  Call our office if you have any questions about your drains. °8. ACTIVITIES:  You may resume regular (light) daily activities beginning the next day--such as daily self-care, walking, climbing stairs--gradually increasing activities as tolerated.  You may have sexual intercourse when it is comfortable.  Refrain from any heavy lifting or straining until approved by your doctor. °a. You may drive when you are no longer taking prescription pain medication, you can comfortably wear a seatbelt, and you can safely maneuver your car and apply brakes. °b. RETURN TO WORK:  __________________________________________________________ °9. You should see your doctor in the office for a follow-up appointment approximately 3-5 days after your surgery.  Your doctor’s nurse will typically make your follow-up appointment when she calls you with your pathology report.  Expect your pathology report 2-3 business days after your surgery.  You may call to check if you do not hear from us after three days.   °10. OTHER INSTRUCTIONS: ______________________________________________________________________________________________ ____________________________________________________________________________________________ °WHEN TO CALL YOUR DOCTOR: °1. Fever over 101.0 °2. Nausea and/or vomiting °3. Extreme swelling or bruising °4. Continued bleeding from incision. °5. Increased pain, redness, or drainage from the incision. °  The clinic staff is available to answer your questions during regular business hours.  Please don’t hesitate to call and ask to speak to one of the nurses for clinical  concerns.  If you have a medical emergency, go to the nearest emergency room or call 911.  A surgeon from [facility] is always on call at the hospital. °[address], [address], [place], [place]  [address] ? [address] [address], [place], [place]   [address] °[phone] ? [phone] ? FAX [phone] °Web site: [email or web address] °

## 2021-01-01 ENCOUNTER — Ambulatory Visit: Payer: Medicare HMO | Admitting: Cardiovascular Disease

## 2021-01-01 DIAGNOSIS — D0512 Intraductal carcinoma in situ of left breast: Secondary | ICD-10-CM | POA: Diagnosis not present

## 2021-01-01 LAB — CBC
HCT: 32.9 % — ABNORMAL LOW (ref 36.0–46.0)
Hemoglobin: 10.3 g/dL — ABNORMAL LOW (ref 12.0–15.0)
MCH: 28.1 pg (ref 26.0–34.0)
MCHC: 31.3 g/dL (ref 30.0–36.0)
MCV: 89.6 fL (ref 80.0–100.0)
Platelets: 246 10*3/uL (ref 150–400)
RBC: 3.67 MIL/uL — ABNORMAL LOW (ref 3.87–5.11)
RDW: 13.4 % (ref 11.5–15.5)
WBC: 8.9 10*3/uL (ref 4.0–10.5)
nRBC: 0 % (ref 0.0–0.2)

## 2021-01-01 LAB — BASIC METABOLIC PANEL
Anion gap: 7 (ref 5–15)
BUN: 37 mg/dL — ABNORMAL HIGH (ref 8–23)
CO2: 26 mmol/L (ref 22–32)
Calcium: 9.8 mg/dL (ref 8.9–10.3)
Chloride: 105 mmol/L (ref 98–111)
Creatinine, Ser: 1.31 mg/dL — ABNORMAL HIGH (ref 0.44–1.00)
GFR, Estimated: 45 mL/min — ABNORMAL LOW (ref >60)
Glucose, Bld: 145 mg/dL — ABNORMAL HIGH (ref 70–99)
Potassium: 4.2 mmol/L (ref 3.5–5.1)
Sodium: 138 mmol/L (ref 135–145)

## 2021-01-01 LAB — GLUCOSE, CAPILLARY
Glucose-Capillary: 61 mg/dL — ABNORMAL LOW (ref 70–99)
Glucose-Capillary: 60 mg/dL — ABNORMAL LOW (ref 70–99)
Glucose-Capillary: 85 mg/dL (ref 70–99)
Glucose-Capillary: 115 mg/dL — ABNORMAL HIGH (ref 70–99)
Glucose-Capillary: 106 mg/dL — ABNORMAL HIGH (ref 70–99)
Glucose-Capillary: 147 mg/dL — ABNORMAL HIGH (ref 70–99)

## 2021-01-01 MED ORDER — OXYCODONE HCL 5 MG PO TABS
5.0000 mg | ORAL_TABLET | Freq: Four times a day (QID) | ORAL | Status: DC | PRN
Start: 2021-01-01 — End: 2021-01-02
  Administered 2021-01-01 – 2021-01-02 (×3): 5 mg via ORAL
  Filled 2021-01-01 (×3): qty 1

## 2021-01-01 MED ORDER — INSULIN GLARGINE 100 UNIT/ML ~~LOC~~ SOLN
35.0000 [IU] | Freq: Two times a day (BID) | SUBCUTANEOUS | Status: DC
  Administered 2021-01-01 – 2021-01-02 (×2): 35 [IU] via SUBCUTANEOUS
  Filled 2021-01-01 (×3): qty 0.35

## 2021-01-01 MED ORDER — GLUCERNA SHAKE PO LIQD
237.0000 mL | Freq: Three times a day (TID) | ORAL | Status: DC
  Administered 2021-01-01 (×2): 237 mL via ORAL

## 2021-01-01 NOTE — Progress Notes (Signed)
2 Days Post-Op   Subjective/Chief Complaint: Some hypoglycemia overnight Pain control adequate.  Objective: Vital signs in last 24 hours: Temp:  [97.8 F (36.6 C)-98.2 F (36.8 C)] 97.8 F (36.6 C) (04/15 0307) Pulse Rate:  [61-66] 61 (04/15 0307) Resp:  [15-19] 15 (04/15 0307) BP: (111-123)/(60-62) 116/62 (04/15 0307) SpO2:  [95 %-98 %] 98 % (04/15 0307) Last BM Date: 01/01/21  Intake/Output from previous day: 04/14 0701 - 04/15 0700 In: 2796.1 [P.O.:920; I.V.:1876.1] Out: 1670 [Urine:1600; Drains:70] Intake/Output this shift: Total I/O In: 360 [P.O.:360] Out: -   General appearance: alert, cooperative and no distress Left chest - flaps viable; no hematoma; serosanguinous drainage in drain bulb  Lab Results:  Recent Labs    12/31/20 0315 01/01/21 0135  WBC 8.2 8.9  HGB 10.4* 10.3*  HCT 32.9* 32.9*  PLT 245 246   BMET Recent Labs    12/31/20 0315 01/01/21 0135  NA 135 138  K 4.5 4.2  CL 101 105  CO2 26 26  GLUCOSE 247* 145*  BUN 36* 37*  CREATININE 1.62* 1.31*  CALCIUM 9.8 9.8   PT/INR No results for input(s): LABPROT, INR in the last 72 hours. ABG No results for input(s): PHART, HCO3 in the last 72 hours.  Invalid input(s): PCO2, PO2  Studies/Results: No results found.  Anti-infectives: Anti-infectives (From admission, onward)   Start     Dose/Rate Route Frequency Ordered Stop   12/30/20 1700  ceFAZolin (ANCEF) IVPB 2g/100 mL premix        2 g 200 mL/hr over 30 Minutes Intravenous Every 8 hours 12/30/20 1320 12/30/20 1809   12/30/20 0630  ceFAZolin (ANCEF) IVPB 2g/100 mL premix        2 g 200 mL/hr over 30 Minutes Intravenous On call to O.R. 12/30/20 0617 12/30/20 0855      Assessment/Plan: s/p Procedure(s): LEFT MASTECTOMY WITH TARGETED AXILLARY LYMPH NODE DISSECTION (Left) Adjust pain meds.  Possible discharge later today.  Patient stated she takes 70 units twice daily long acting insulin but prescription says 35 units twice daily -  will switch back to 35 units twice daily for now and check her glucose thorughout the day today and tomorrow    LOS: 1 day    Felicie Morn 01/01/2021

## 2021-01-01 NOTE — Progress Notes (Signed)
Inpatient Diabetes Program Recommendations  AACE/ADA: New Consensus Statement on Inpatient Glycemic Control   Target Ranges:  Prepandial:   less than 140 mg/dL      Peak postprandial:   less than 180 mg/dL (1-2 hours)      Critically ill patients:  140 - 180 mg/dL  Results for Cheryl Blankenship, Cheryl Blankenship (MRN [id number]) as of 01/01/2021 08:07  Ref. Range 12/31/2020 07:51 12/31/2020 12:09 12/31/2020 17:35 12/31/2020 21:02 01/01/2021 07:56  Glucose-Capillary Latest Ref Range: 70 - 99 mg/dL 162 (H) 197 (H) 131 (H) 135 (H) 61 (L)    Review of Glycemic Control  Diabetes history: DM2 Outpatient Diabetes medications: Lantus 70 units BID, Novolog 2-6 units TID, Jardiance 10 mg daily, Glipizide XL 5 mg QAM, Metformin 500 mg BID Current orders for Inpatient glycemic control:  Lantus 70 units BID, Novolog 0-15 units TID with meals, Novolog 0-5 units QHS, Jardiance 10 mg daily, Metformin 500 mg BID, Glipizide XL 5 mg QAM  Inpatient Diabetes Program Recommendations:    Insulin: If remains inpatient, please decrease Lantus to 68 units BID.  HbgA1C:A1C 10.1% on 12/30/20 indicating an average glucose of 243 mg/dl over the past 2-3 months  Rx at Discharge: At time of discharge, please provide Rx for glucose monitoring kit (#[id number]).  Thanks, Barnie Alderman, RN, MSN, CDE Diabetes Coordinator Inpatient Diabetes Program [phone] (Team Pager from 8am to 5pm)

## 2021-01-01 NOTE — Progress Notes (Signed)
Spoke to patient daughter Vladimir Creeks about her care for today. She is agreeing about her staying an extra night. The daughter concern about pain meds for home. Reassure daughter that pain meds should be given on discharge.

## 2021-01-02 DIAGNOSIS — D0512 Intraductal carcinoma in situ of left breast: Secondary | ICD-10-CM | POA: Diagnosis not present

## 2021-01-02 LAB — GLUCOSE, CAPILLARY
Glucose-Capillary: 158 mg/dL — ABNORMAL HIGH (ref 70–99)
Glucose-Capillary: 122 mg/dL — ABNORMAL HIGH (ref 70–99)
Glucose-Capillary: 139 mg/dL — ABNORMAL HIGH (ref 70–99)

## 2021-01-02 MED ORDER — OXYCODONE-ACETAMINOPHEN 5-325 MG PO TABS: 1.0000 | ORAL_TABLET | ORAL | 0 refills | Status: DC | PRN

## 2021-01-02 NOTE — Plan of Care (Signed)

## 2021-01-02 NOTE — Progress Notes (Signed)
Breast Cancer Bag given to pt.

## 2021-01-02 NOTE — Discharge Summary (Signed)
Patient ID: Cheryl Blankenship [id number] 70 y.o. Jul 18, 1951  12/30/2020  Discharge date and time: 01/02/2021  Admitting Physician: [place]  Discharge Physician: [place]  Admission Diagnoses: Invasive ductal carcinoma of breast, female, left [facility]) [C50.912] Patient Active Problem List   Diagnosis Date Noted  . Invasive ductal carcinoma of breast, female, left ([place]) 12/30/2020  . Chest pain 10/30/2020  . Constipation 10/30/2020  . Type 2 diabetes mellitus without complication ([place]) 40/98/1191  . Exertional dyspnea 08/26/2020  . Essential hypertension 08/26/2020  . Acute diastolic CHF (congestive heart failure) ([place]) 08/26/2020  . Obesity, diabetes, and hypertension syndrome ([place]) 08/26/2020  . Malignant neoplasm of upper-outer quadrant of left breast in female, estrogen receptor positive ([place]) 07/20/2020     Discharge Diagnoses:  Breast cancer, Final path: A. BREAST, LEFT, MASTECTOMY:  - Focal residual invasive ductal carcinoma status post neoadjuvant  treatment.  - Residual ductal carcinoma in situ.  - Margins of resection are not involved.  - Biopsy clip (X2).  - See oncology table.  B. LYMPH NODES, LEFT #1, DISSECTION:  - Three lymph nodes, negative for carcinoma (0/3).  - Biopsy clip (X1).  C. LYMPH NODES, LEFT #2, DISSECTION:  - Two lymph nodes, negative for carcinoma (0/2).   Patient Active Problem List   Diagnosis Date Noted  . Invasive ductal carcinoma of breast, female, left ([place]) 12/30/2020  . Chest pain 10/30/2020  . Constipation 10/30/2020  . Type 2 diabetes mellitus without complication ([place]) 47/82/9562  . Exertional dyspnea 08/26/2020  . Essential hypertension 08/26/2020  . Acute diastolic CHF (congestive heart failure) ([place]) 08/26/2020  . Obesity, diabetes, and hypertension syndrome ([place]) 08/26/2020  . Malignant neoplasm of upper-outer quadrant of left breast in female, estrogen receptor positive ([place]) 07/20/2020     Operations: Procedure(s): LEFT MASTECTOMY WITH TARGETED AXILLARY LYMPH NODE DISSECTION  Admission Condition: good  Discharged Condition: good  Indication for Admission: Breast cancer  Hospital Course: Ms. Cichowski presented for elective left mastectomy with sentinel lymph node biopsy on 12/30/20, with Dr. [name].  She dealt with some hypoglycemia postoperatively.  She was instructed to take 35 units of long acting insulin twice daily in the post-operative period unless her sugars become too high again then she can go back to her 70 unit dose twice daily. She was instructed to follow up with her primary care physician for ongoing insulin management.    Consults: None  Significant Diagnostic Studies: none  Treatments: surgery: as above  Disposition: Home  Patient Instructions:  Allergies as of 01/02/2021      Reactions   Coconut (cocos Nucifera) Allergy Skin Test    Hives and swells mouth   Hydrochlorothiazide Swelling   tongue   Lisinopril Swelling   Angioedema. Tolerates Losartan.   Coconut Oil Hives      Medication List    TAKE these medications   Accu-Chek FastClix Lancets Misc   albuterol 108 (90 Base) MCG/ACT inhaler Commonly known as: VENTOLIN HFA Inhale 1-2 puffs into the lungs every 4 (four) hours as needed for wheezing or shortness of breath.   ALPRAZolam 0.25 MG tablet Commonly known as: XANAX Take 1-2 tablets (0.25-0.5 mg total) by mouth 3 (three) times daily as needed for anxiety or sleep.   amLODipine 10 MG tablet Commonly known as: NORVASC Take 1 tablet (10 mg total) by mouth daily.   aspirin 81 MG chewable tablet Chew 81 mg by mouth daily.   bisacodyl 5 MG EC tablet Commonly known as: DULCOLAX Take 1  tablet (5 mg total) by mouth daily as needed for moderate constipation.   carvedilol 25 MG tablet Commonly known as: COREG Take 1 tablet (25 mg total) by mouth 2 (two) times daily with a meal.   Cholecalciferol 25 MCG (1000 UT) capsule Take 1,000  Units by mouth daily.   diphenoxylate-atropine 2.5-0.025 MG tablet Commonly known as: LOMOTIL Take 1 tablet by mouth 4 (four) times daily as needed for diarrhea or loose stools.   empagliflozin 10 MG Tabs tablet Commonly known as: JARDIANCE Take 10 mg by mouth daily.   exemestane 25 MG tablet Commonly known as: AROMASIN Take 1 tablet (25 mg total) by mouth daily after breakfast.   feeding supplement (GLUCERNA 1.2 CAL) Liqd Take 237 mLs by mouth 3 (three) times daily with meals.   Fish Oil 1000 MG Caps Take 1,000 mg by mouth daily.   folic acid 1 MG tablet Commonly known as: FOLVITE Take 1 mg by mouth daily.   furosemide 20 MG tablet Commonly known as: LASIX Take 40 mg by mouth daily.   gabapentin 400 MG capsule Commonly known as: NEURONTIN Take 400 mg by mouth 3 (three) times daily.   glipiZIDE 5 MG 24 hr tablet Commonly known as: GLUCOTROL XL Take 5 mg by mouth daily with breakfast.   insulin aspart 100 UNIT/ML injection Commonly known as: novoLOG Inject 2-10 Units into the skin 3 (three) times daily before meals. Per sliding scale:  150-199 = 2 units, 200-249 = 4 units, 250-299 = 6 units, 300-349 = 8 units, greater than350 = 10 units.   Lantus SoloStar 100 UNIT/ML Solostar Pen Generic drug: insulin glargine Inject 35 Units into the skin 2 (two) times daily. What changed:   how much to take  additional instructions   lidocaine-prilocaine cream Commonly known as: EMLA APPLY TO AFFECTED AREA 1 time DAILY What changed:   how much to take  how to take this  when to take this  reasons to take this  additional instructions   losartan 100 MG tablet Commonly known as: COZAAR Take 1 tablet (100 mg total) by mouth daily.   metFORMIN 500 MG tablet Commonly known as: GLUCOPHAGE Take 500 mg by mouth 2 (two) times daily with a meal.   methocarbamol 500 MG tablet Commonly known as: ROBAXIN Take 500 mg by mouth every 8 (eight) hours as needed for muscle  spasms.   omeprazole 20 MG capsule Commonly known as: PRILOSEC Take 1 capsule (20 mg total) by mouth daily.   ondansetron 4 MG disintegrating tablet Commonly known as: ZOFRAN-ODT Take 1 tablet (4 mg total) by mouth 2 (two) times daily as needed for up to 30 doses for nausea or vomiting.   oxyCODONE-acetaminophen 5-325 MG tablet Commonly known as: Percocet Take 1 tablet by mouth every 4 (four) hours as needed for severe pain.   potassium chloride 10 MEQ tablet Commonly known as: KLOR-CON Take 20 mEq by mouth daily.   prochlorperazine 10 MG tablet Commonly known as: COMPAZINE Take 1 tablet (10 mg total) by mouth every 6 (six) hours as needed for nausea or vomiting.   promethazine 25 MG suppository Commonly known as: PHENERGAN Place 1 suppository (25 mg total) rectally every 8 (eight) hours as needed for nausea or vomiting.   Restasis 0.05 % ophthalmic emulsion Generic drug: cycloSPORINE Place 1 drop into both eyes 2 (two) times daily as needed (dry eyes).   spironolactone 25 MG tablet Commonly known as: Aldactone Take 1 tablet (25 mg total) by mouth  daily.   sucralfate 1 GM/10ML suspension Commonly known as: CARAFATE Take 10 mLs (1 g total) by mouth 4 (four) times daily -  with meals and at bedtime.   vitamin B-12 1000 MCG tablet Commonly known as: CYANOCOBALAMIN Take 1,000 mcg by mouth daily.   zolpidem 5 MG tablet Commonly known as: AMBIEN Take 1 tablet (5 mg total) by mouth at bedtime as needed for sleep.       Activity: activity as tolerated Diet: regular diet Wound Care: keep wound clean and dry  Follow-up:  With Dr. [name] in 3 week.  Signed: Nickola Major Twanda Stakes General, Bariatric, & Minimally Invasive Surgery [facility], [place]   01/02/2021, 9:50 AM

## 2021-01-04 LAB — SURGICAL PATHOLOGY

## 2021-01-05 ENCOUNTER — Encounter: Payer: Self-pay | Admitting: *Deleted

## 2021-01-06 ENCOUNTER — Encounter: Payer: Self-pay | Admitting: *Deleted

## 2021-01-06 ENCOUNTER — Other Ambulatory Visit (HOSPITAL_COMMUNITY): Payer: Medicare HMO

## 2021-01-06 ENCOUNTER — Encounter (HOSPITAL_COMMUNITY): Payer: Self-pay | Admitting: Cardiovascular Disease

## 2021-01-06 DIAGNOSIS — Z170 Estrogen receptor positive status [ER+]: Secondary | ICD-10-CM

## 2021-01-06 DIAGNOSIS — C50412 Malignant neoplasm of upper-outer quadrant of left female breast: Secondary | ICD-10-CM

## 2021-01-11 NOTE — Progress Notes (Deleted)
Cardiology Office Note:   Date:  01/11/2021  NAME:  Cheryl Blankenship    MRN: [id number] DOB:  1951-07-01   PCP:  [name], PA-C  Cardiologist:  No primary care provider on file.  Electrophysiologist:  None   Referring MD: Dayton Scrape*   No chief complaint on file. ***  History of Present Illness:   Cheryl Blankenship is a 70 y.o. female with a hx of diastolic HF, breast CA (s/p mastectomy on neo-adjuvant herceptin/exemestane), HLD, HTN who presents for follow-up. Has not tolerated chemo for breast CA. Underwent mastectomy and is on herceptin. Needs follow-up echo.   Problem List 1. DM -A1c10.1 -Normal stress test, normal echo 08/2019([facility]) 2. HTN 3. HLD -T chol 154, TG 139, HDL 60, LDL 66 4. Breast CA Stage II, ER+, PR+, HER2+  -Dx 06/2020 -did not tolerate chemotherapy  -L mastectomy with lymph node dissection 12/30/2020 -currently on neo-adjuvant antiestrogen therapy (herceptin/exemestane) 5. HFpEF -EF 65-70%  Past Medical History: Past Medical History:  Diagnosis Date  . Anxiety   . Breast cancer ([place])   . CHF (congestive heart failure) ([place])   . Depression   . Diabetes mellitus without complication ([place])    type 2  . GERD (gastroesophageal reflux disease)   . History of kidney stones   . Hypertension   . PONV (postoperative nausea and vomiting)   . Stroke [facility])     Past Surgical History: Past Surgical History:  Procedure Laterality Date  . APPENDECTOMY    . CHOLECYSTECTOMY    . COLONOSCOPY    . ESOPHAGOGASTRODUODENOSCOPY (EGD) WITH PROPOFOL N/A 11/01/2020   Procedure: ESOPHAGOGASTRODUODENOSCOPY (EGD) WITH PROPOFOL;  Surgeon: Arta Silence, MD;  Location: [facility];  Service: Endoscopy;  Laterality: N/A;  . EYE SURGERY     cataracts  . MASTECTOMY WITH AXILLARY LYMPH NODE DISSECTION Left 12/30/2020   Procedure: LEFT MASTECTOMY WITH TARGETED AXILLARY LYMPH NODE DISSECTION;  Surgeon: [name], MD;  Location: [place];  Service: General;  Laterality: Left;  Marland Kitchen MULTIPLE TOOTH EXTRACTIONS    . PORTACATH PLACEMENT N/A 08/04/2020   Procedure: INSERTION PORT-A-CATH WITH ULTRASOUND GUIDANCE;  Surgeon: [name], MD;  Location: [place];  Service: General;  Laterality: N/A;  . STOMACH SURGERY      Current Medications: No outpatient medications have been marked as taking for the 01/12/21 encounter (Appointment) with [name], [name], MD.     Allergies:    Coconut (cocos nucifera) allergy skin test, Hydrochlorothiazide, Lisinopril, and Coconut oil   Social History: Social History   Socioeconomic History  . Marital status: Married    Spouse name: Not on file  . Number of children: 2  . Years of education: Not on file  . Highest education level: Not on file  Occupational History  . Occupation: retired Human resources officer  Tobacco Use  . Smoking status: Former Smoker    Packs/day: 3.00    Years: 30.00    Pack years: 90.00  . Smokeless tobacco: Never Used  Vaping Use  . Vaping Use: Never used  Substance and Sexual Activity  . 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 Drug use: Never  . Sexual activity: Not on file  Other Topics Concern  . Not on file  Social History Narrative  . Not on file   Social Determinants of Health   Financial Resource Strain: Not on file  Food Insecurity: Not on file  Transportation  Needs: Not on file  Physical Activity: Not on file  Stress: Not on file  Social Connections: Not on file     Family History: The patient's ***family history includes Cancer in her cousin, cousin, and niece; Diabetes in her sister; Heart attack in her brother, mother, and sister; Ovarian cancer (age of onset: 10) in her mother.  ROS:   All other ROS reviewed and negative. Pertinent positives noted in the HPI.     EKGs/Labs/Other Studies Reviewed:   The following studies were personally reviewed by me today:  EKG:  EKG is *** ordered  today.  The ekg ordered today demonstrates ***, and was personally reviewed by me.   Recent Labs: 08/25/2020: B Natriuretic Peptide 45.4 10/30/2020: Magnesium 1.8 12/10/2020: ALT 15 01/01/2021: BUN 37; Creatinine, Ser 1.31; Hemoglobin 10.3; Platelets 246; Potassium 4.2; Sodium 138   Recent Lipid Panel    Component Value Date/Time   CHOL 134 10/30/2020 1513   TRIG 165 (H) 10/30/2020 1513   HDL 38 (L) 10/30/2020 1513   CHOLHDL 3.5 10/30/2020 1513   VLDL 33 10/30/2020 1513   LDLCALC 63 10/30/2020 1513    Physical Exam:   VS:  There were no vitals taken for this visit.   Wt Readings from Last 3 Encounters:  12/30/20 206 lb (93.4 kg)  12/10/20 209 lb 1.6 oz (94.8 kg)  11/26/20 206 lb 3.2 oz (93.5 kg)    General: Well nourished, well developed, in no acute distress Head: Atraumatic, normal size  Eyes: PEERLA, EOMI  Neck: Supple, no JVD Endocrine: No thryomegaly Cardiac: Normal S1, S2; RRR; no murmurs, rubs, or gallops Lungs: Clear to auscultation bilaterally, no wheezing, rhonchi or rales  Abd: Soft, nontender, no hepatomegaly  Ext: No edema, pulses 2+ Musculoskeletal: No deformities, BUE and BLE strength normal and equal Skin: Warm and dry, no rashes   Neuro: Alert and oriented to person, place, time, and situation, CNII-XII grossly intact, no focal deficits  Psych: Normal mood and affect   ASSESSMENT:   Cheryl Blankenship is a 70 y.o. female who presents for the following: No diagnosis found.  PLAN:   There are no diagnoses linked to this encounter.  Disposition: No follow-ups on file.  Medication Adjustments/Labs and Tests Ordered: Current medicines are reviewed at length with the patient today.  Concerns regarding medicines are outlined above.  No orders of the defined types were placed in this encounter.  No orders of the defined types were placed in this encounter.   There are no Patient Instructions on file for this visit.   Time Spent with Patient: I have spent  a total of *** minutes with patient reviewing hospital notes, telemetry, EKGs, labs and examining the patient as well as establishing an assessment and plan that was discussed with the patient.  > 50% of time was spent in direct patient care.  Signed, Addison Naegeli. Audie Box, MD, [place]  [address], [place], [place] [address] [phone]  01/11/2021 8:26 PM

## 2021-01-12 ENCOUNTER — Ambulatory Visit: Payer: Medicare HMO | Admitting: Cardiovascular Disease

## 2021-01-12 ENCOUNTER — Telehealth: Payer: Self-pay

## 2021-01-12 DIAGNOSIS — Z09 Encounter for follow-up examination after completed treatment for conditions other than malignant neoplasm: Secondary | ICD-10-CM

## 2021-01-12 DIAGNOSIS — I5032 Chronic diastolic (congestive) heart failure: Secondary | ICD-10-CM

## 2021-01-12 DIAGNOSIS — I10 Essential (primary) hypertension: Secondary | ICD-10-CM

## 2021-01-12 DIAGNOSIS — E782 Mixed hyperlipidemia: Secondary | ICD-10-CM

## 2021-01-12 NOTE — Telephone Encounter (Signed)
Called patient daughter- advised of missed appointment for today 04/26- daughter apologized as she was suppose to call this morning, and got busy with work. She states her mother had an appointment yesterday and had some things completed and she was sore this morning and wasn't feeling well. She did reschedule for next week- okay per Dr.[name] to be seen without having ECHO completed and we will wait a few weeks for this to be completed due to recent Mastectomy. Patient daughter thankful for call- and reschedule no other questions at this time.

## 2021-01-14 NOTE — Progress Notes (Signed)
[place]   Telephone:([phone]) [phone] Fax:(336) [phone]   Clinic Follow up Note   Patient Care Team: [name], PA-C as PCP - General (Internal Medicine) [name], RN as Oncology Nurse Navigator [name], RN as Oncology Nurse Navigator [name], MD as Consulting Physician (General Surgery) [name], MD as Consulting Physician (Hematology) [name], MD as Consulting Physician (Radiation Oncology) [name], [name], MD as Consulting Physician (Cardiology)  Date of Service:  01/21/2021  CHIEF COMPLAINT: f/u of left breast cancer  SUMMARY OF ONCOLOGIC HISTORY: Oncology History Overview Note  Cancer Staging Malignant neoplasm of upper-outer quadrant of left breast in female, estrogen receptor positive ([place]) Staging form: Breast, AJCC 8th Edition - Clinical stage from 07/16/2020: Stage IB (cT2, cN1, cM0, G2, ER+, PR+, HER2+) - Signed by [name], MD on 07/21/2020    Malignant neoplasm of upper-outer quadrant of left breast in female, estrogen receptor positive ([place])  07/01/2020 Mammogram   IMPRESSION: 1. There is a 12 cm span of highly suspicious calcifications involving the upper outer and upper inner quadrants of the left breast. On ultrasound, within this area of calcifications, there are confluent masses at 1 o'clock spanning at least 4.5 cm. An additional suspicious mass is seen in the left breast at 2 o'clock.  -Ultrasound targeted to the left breast at 1 o'clock, 5 cm from the nipple demonstrates a broad area of hypoechoic irregular tissue/confluent masses spanning at least 4.5 cm. There is a separate oval hypoechoic mass with indistinct margins at 2 o'clock, 7 cm from the nipple measuring 0.9 x 0.4 x 0.9 cm. Ultrasound of the left axilla demonstrates 2 abnormally thickened lymph nodes with cortices measuring 6-7 mm. There is an additional lymph node with a borderline cortex of 4 mm.   2. There are 2 lymph nodes  with thickened cortices in the left axilla, and an additional lymph node with a borderline thickened cortex.   07/16/2020 Cancer Staging   Staging form: Breast, AJCC 8th Edition - Clinical stage from 07/16/2020: Stage IIA (cT3, cN1, cM0, G2, ER+, PR+, HER2+) - Signed by [name], MD on 08/05/2020   07/16/2020 Initial Biopsy   Diagnosis 1. 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 Breast, left, needle core biopsy, 1 o'clock - INVASIVE DUCTAL CARCINOMA - SEE COMMENT 3. Lymph node, biopsy - METASTATIC CARCINOMA INVOLVING A LYMPH NODE - SEE COMMENT Microscopic Comment 1. Based on the biopsy, the ductal carcinoma in situ has a comedo pattern, high nuclear grade and measures 0.7 cm in greatest linear extent. Prognostic markers (ER/PR) are pending and will be reported in an addendum. 2. Based on the biopsy, the carcinoma appears Nottingham grade 2 of 3 and measures 1.5 cm in greatest linear extent. Prognostic markers (ER/PR/ki-67/HER2) are pending and will be reported in an addendum. Dr. Saralyn Pilar reviewed the case and agrees with the above diagnosis. These results were called to [facility] on July 17, 2020. 3. Based on the biopsy, the carcinoma appears measures 1.0 cm in greatest linear extent. Prognostic markers (ER/PR/ki-67/HER2) are pending and will be reported in an addendum.   07/16/2020 Receptors her2   1. PROGNOSTIC INDICATORS Results: IMMUNOHISTOCHEMICAL AND MORPHOMETRIC ANALYSIS PERFORMED MANUALLY Estrogen Receptor: 30%, POSITIVE, STRONG STAINING INTENSITY Progesterone Receptor: 5%, POSITIVE, STRONG STAINING INTENSITY  2. PROGNOSTIC INDICATORS Results: IMMUNOHISTOCHEMICAL AND MORPHOMETRIC ANALYSIS PERFORMED MANUALLY The tumor cells are POSITIVE for  Her2 (3+). Estrogen Receptor: 85%, POSITIVE, STRONG STAINING INTENSITY Progesterone Receptor:  60%, POSITIVE, STRONG STAINING INTENSITY Proliferation Marker Ki67: 25%  3. PROGNOSTIC INDICATORS Results: IMMUNOHISTOCHEMICAL AND MORPHOMETRIC ANALYSIS PERFORMED MANUALLY The tumor cells are POSITIVE for Her2 (3+). Estrogen Receptor: 95%, POSITIVE, STRONG STAINING INTENSITY Progesterone Receptor: 45%, POSITIVE, STRONG STAINING INTENSITY Proliferation Marker Ki67: 30%   07/20/2020 Initial Diagnosis   Malignant neoplasm of upper-outer quadrant of left breast in female, estrogen receptor positive ([place])   07/31/2020 Echocardiogram   Baseline Echo  IMPRESSIONS     1. Left ventricular ejection fraction, by estimation, is 65 to 70%. The  left ventricle has normal function. The left ventricle has no regional  wall motion abnormalities. Left ventricular diastolic parameters are  indeterminate. The average left  ventricular global longitudinal strain is -17.1 %.   2. Right ventricular systolic function is normal. The right ventricular  size is normal. There is normal pulmonary artery systolic pressure.   3. The mitral valve is normal in structure. Trivial mitral valve  regurgitation. No evidence of mitral stenosis.   4. The aortic valve is normal in structure. Aortic valve regurgitation is  not visualized. No aortic stenosis is present.    07/31/2020 Breast MRI   IMPRESSION: 1. Extensive non mass enhancement within the left breast extending from the nipple to the chest wall. Overall findings are most compatible with extensive malignancy involving the majority of the left breast. 2. Indeterminate enhancing mass within the upper inner right breast. 3. Three morphologically abnormal lymph nodes within the left axilla. One of these nodes has been biopsied compatible with metastatic adenopathy.     08/03/2020 Imaging   Bone Scan whole body  IMPRESSION: Increased radiotracer uptake in the mid to lower lumbar regions of uncertain etiology. Correlation with radiography to assess  for potential arthropathy in these areas advised. Increased uptake in major joints is likely of arthropathic etiology. Increased uptake in the mid face is likely due to paranasal sinus disease. Distribution of radiotracer uptake in bony structures elsewhere unremarkable.   08/04/2020 Procedure   PAC placement by Dr [name]   08/05/2020 - 10/28/2020 Neo-Adjuvant Chemotherapy   Neoadjuvant THP q3weeks for 4-6 cycles starting 08/05/20, followed by Herceptin or Kadcyla q3weeks to complete 1 year of treatment. Stopped THP after 1 dose due to poor toleration       -I switched her to [place] starting 08/25/20. Stopped after C2 due to poor toleration (10/28/20)   08/07/2020 Imaging   CT CAP  IMPRESSION: 2.7 cm mass in the upper outer left breast, likely corresponding to the patient's known primary breast neoplasm.   8 mm short axis left axillary node, suspicious for nodal metastasis in this clinical context.   No findings suspicious for distant metastasis.   Endometrium is mildly prominent, measuring 11 mm. Correlate for vaginal bleeding and consider pelvic ultrasound for further evaluation if clinically warranted.   08/11/2020 Pathology Results   Diagnosis Breast, right, needle core biopsy, right breast - PAPILLARY LESION - SEE COMMENT Microscopic Comment The biopsy consists of a papillary lesion with a focal area of adenosis. Immunohistochemistry (SMM, calponin and p63) shows retention of the myoepithelial cell layer in the focus of adenosis. 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 These results were called to [facility] on August 12, 2020.   10/27/2020 Imaging   CT Angio chest  IMPRESSION: Slightly suboptimal opacification of the main pulmonary artery, however no central or proximal segmental pulmonary embolism  Minimal bibasilar atelectasis   Aortic Atherosclerosis (ICD10-I70.0).   10/31/2020 Imaging   CT AP  IMPRESSION: 1.  No acute finding by CT other than a large amount of stool and gas in the right colon and gas in the transverse colon. No small bowel obstruction. 2. Previous cholecystectomy. 3. Aortic atherosclerosis. 4. Umbilical hernia containing only fat. 5. Small amount of fluid in the endometrial canal as seen previously.   Aortic Atherosclerosis (ICD10-I70.0).   12/04/2020 Imaging   MRI Breast IMPRESSION: 1. Evidence of a positive response to interval treatment. There has been a significant improvement of both the extent and strength of the enhancement throughout the outer LEFT breast when compared to the earlier MRI of 07/31/2020. 2. However, there remains scattered areas of mass and non-mass enhancement throughout the outer LEFT breast, involving the upper outer quadrant and lower outer quadrant, as detailed above. 3. The 3 morphologically abnormal lymph nodes in the LEFT axilla identified on previous MRI, 1 a biopsy-proven metastasis, are all slightly smaller indicating an additional positive response to interval treatment. 4. No evidence of malignancy within the RIGHT breast.   12/10/2020 -  Chemotherapy    Patient is on Treatment Plan: BREAST TRASTUZUMAB Q21D      12/30/2020 Surgery   A. BREAST, LEFT, MASTECTOMY:  - Focal residual invasive ductal carcinoma status post neoadjuvant  treatment.  - Residual ductal carcinoma in situ.  - Margins of resection are not involved.  - Biopsy clip (X2).   B. LYMPH NODES, LEFT #1, DISSECTION:  - Three lymph nodes, negative for carcinoma (0/3).  - Biopsy clip (X1).   C. LYMPH NODES, LEFT #2, DISSECTION:  - Two lymph nodes, negative for carcinoma (0/2).   PROGNOSTIC INDICATOR RESULTS:  - The tumor cells are POSITIVE for Her2 (3+).  - Estrogen Receptor:       POSITIVE, 90%, STRONG STAINING  - Progesterone Receptor:   POSITIVE, 50%, MODERATE STAINING    12/30/2020 Cancer Staging   Staging form: Breast, AJCC 8th Edition - Pathologic stage  from 12/30/2020: No Stage Recommended (ypT1a, pN0, cM0, G2, ER+, PR+, HER2+) - Signed by [name], MD on 01/20/2021 Stage prefix: Post-therapy Histologic grading system: 3 grade system Residual tumor (R): R0 - None      CURRENT THERAPY:  -Herceptin, every 3 weeks -Exemestane daily  INTERVAL HISTORY:  Cheryl Blankenship is here for a follow up of breast cancer. She was last seen by me on 12/10/20. She presents to the clinic accompanied by daughter, as well as her son via speakerphone. She notes she had her drain taken out recently. She reports continued discomfort to this area, improved since removal of the drain, without pain.  All other systems were reviewed with the patient and are negative.  MEDICAL HISTORY:  Past Medical History:  Diagnosis Date  . Anxiety   . Breast cancer ([place])   . CHF (congestive heart failure) ([place])   . Depression   . Diabetes mellitus without complication ([place])    type 2  . GERD (gastroesophageal reflux disease)   . History of kidney stones   . Hypertension   . PONV (postoperative nausea and vomiting)   . Stroke [facility])     SURGICAL HISTORY: Past Surgical History:  Procedure Laterality Date  . APPENDECTOMY    . CHOLECYSTECTOMY    . COLONOSCOPY    . ESOPHAGOGASTRODUODENOSCOPY (EGD) WITH PROPOFOL N/A 11/01/2020   Procedure: ESOPHAGOGASTRODUODENOSCOPY (EGD) WITH PROPOFOL;  Surgeon: Arta Silence, MD;  Location: [facility];  Service: Endoscopy;  Laterality: N/A;  . EYE SURGERY     cataracts  . MASTECTOMY WITH AXILLARY LYMPH NODE DISSECTION Left 12/30/2020   Procedure: LEFT MASTECTOMY WITH TARGETED AXILLARY LYMPH NODE DISSECTION;  Surgeon: [name], MD;  Location: [place];  Service: General;  Laterality: Left;  Marland Kitchen MULTIPLE TOOTH EXTRACTIONS    . PORTACATH PLACEMENT N/A 08/04/2020   Procedure: INSERTION PORT-A-CATH WITH ULTRASOUND GUIDANCE;  Surgeon: [name], MD;  Location: [place];  Service: General;  Laterality: N/A;  . STOMACH SURGERY       I have reviewed the social history and family history with the patient and they are unchanged from previous note.  ALLERGIES:  is allergic to coconut (cocos nucifera) allergy skin test, hydrochlorothiazide, lisinopril, and coconut oil.  MEDICATIONS:  Current Outpatient Medications  Medication Sig Dispense Refill  . Accu-Chek FastClix Lancets MISC     . albuterol (VENTOLIN HFA) 108 (90 Base) MCG/ACT inhaler Inhale 1-2 puffs into the lungs every 4 (four) hours as needed for wheezing or shortness of breath. 8 g 0  . ALPRAZolam (XANAX) 0.25 MG tablet Take 1-2 tablets (0.25-0.5 mg total) by mouth 3 (three) times daily as needed for anxiety or sleep. 30 tablet 0  . amLODipine (NORVASC) 10 MG tablet Take 1 tablet (10 mg total) by mouth daily. (Patient not taking: No sig reported) 30 tablet 10  . aspirin 81 MG chewable tablet Chew 81 mg by mouth daily.     . bisacodyl (DULCOLAX) 5 MG EC tablet Take 1 tablet (5 mg total) by mouth daily as needed for moderate constipation. (Patient not taking: No sig reported) 30 tablet 0  . carvedilol (COREG) 25 MG tablet Take 1 tablet (25 mg total) by mouth 2 (two) times daily with a meal. 180 tablet 3  . Cholecalciferol 25 MCG (1000 UT) capsule Take 1,000 Units by mouth daily.    . diphenoxylate-atropine (LOMOTIL) 2.5-0.025 MG tablet Take 1 tablet by mouth 4 (four) times daily as needed for diarrhea or loose stools.    . empagliflozin (JARDIANCE) 10 MG TABS tablet Take 10 mg by mouth daily.    Marland Kitchen exemestane (AROMASIN) 25 MG tablet Take 1 tablet (25 mg total) by mouth daily after breakfast. 30 tablet 2  . folic acid (FOLVITE) 1 MG tablet Take 1 mg by mouth daily.    . furosemide (LASIX) 20 MG tablet Take 40 mg by mouth daily.    Marland Kitchen gabapentin (NEURONTIN) 400 MG capsule Take 400 mg by mouth 3 (three) times daily.     Marland Kitchen glipiZIDE (GLUCOTROL XL) 5 MG 24 hr tablet Take 5 mg by mouth daily with breakfast.    . insulin aspart (NOVOLOG) 100 UNIT/ML injection Inject 2-10  Units into the skin 3 (three) times daily before meals. Per sliding scale:  150-199 = 2 units, 200-249 = 4 units, 250-299 = 6 units, 300-349 = 8 units, greater than350 = 10 units.    . insulin glargine (LANTUS SOLOSTAR) 100 UNIT/ML Solostar Pen Inject 35 Units into the skin 2 (two) times daily. (Patient taking differently: Inject 70 Units into the skin 2 (two) times daily. Pt takes 70 units in the morning and 70 units at night.) 15 mL 11  . lidocaine-prilocaine (EMLA) cream Apply 1 application topically as needed (pain). 30 g 3  . losartan (COZAAR) 100 MG tablet Take 1 tablet (100 mg total) by mouth daily. 90 tablet 1  . metFORMIN (GLUCOPHAGE) 500 MG tablet Take 500 mg by mouth 2 (two)  times daily with a meal.     . methocarbamol (ROBAXIN) 500 MG tablet Take 500 mg by mouth every 8 (eight) hours as needed for muscle spasms.    . Nutritional Supplements (FEEDING SUPPLEMENT, GLUCERNA 1.2 CAL,) LIQD Take 237 mLs by mouth 3 (three) times daily with meals.    . Omega-3 Fatty Acids (FISH OIL) 1000 MG CAPS Take 1,000 mg by mouth daily.    Marland Kitchen omeprazole (PRILOSEC) 20 MG capsule Take 1 capsule (20 mg total) by mouth daily. 30 capsule 11  . ondansetron (ZOFRAN-ODT) 4 MG disintegrating tablet Take 1 tablet (4 mg total) by mouth 2 (two) times daily as needed for up to 30 doses for nausea or vomiting. 30 tablet 0  . oxyCODONE-acetaminophen (PERCOCET) 5-325 MG tablet Take 1 tablet by mouth every 4 (four) hours as needed for severe pain. 20 tablet 0  . potassium chloride (KLOR-CON) 10 MEQ tablet Take 20 mEq by mouth daily.    . prochlorperazine (COMPAZINE) 10 MG tablet Take 1 tablet (10 mg total) by mouth every 6 (six) hours as needed for nausea or vomiting. 30 tablet 1  . promethazine (PHENERGAN) 25 MG suppository Place 1 suppository (25 mg total) rectally every 8 (eight) hours as needed for nausea or vomiting. (Patient not taking: Reported on 01/21/2021) 30 each 1  . RESTASIS 0.05 % ophthalmic emulsion Place 1 drop  into both eyes 2 (two) times daily as needed (dry eyes).    Marland Kitchen spironolactone (ALDACTONE) 25 MG tablet Take 1 tablet (25 mg total) by mouth daily. 90 tablet 1  . sucralfate (CARAFATE) 1 GM/10ML suspension Take 10 mLs (1 g total) by mouth 4 (four) times daily -  with meals and at bedtime. (Patient not taking: No sig reported) 420 mL 0  . vitamin B-12 (CYANOCOBALAMIN) 1000 MCG tablet Take 1,000 mcg by mouth daily.    Marland Kitchen zolpidem (AMBIEN) 5 MG tablet Take 1 tablet (5 mg total) by mouth at bedtime as needed for sleep. 20 tablet 0   No current facility-administered medications for this visit.    PHYSICAL EXAMINATION: ECOG PERFORMANCE STATUS: 2 - Symptomatic, <50% confined to bed  Vitals:   01/21/21 1048  BP: 110/82  Pulse: 68  Resp: 17  Temp: 98.8 F (37.1 C)  SpO2: 99%   Filed Weights   01/21/21 1048  Weight: 209 lb (94.8 kg)   Due to COVID19 we will limit examination to appearance. Patient had no complaints.  GENERAL:alert, no distress and comfortable SKIN: skin color normal, no rashes or significant lesions EYES: normal, Conjunctiva are pink and non-injected, sclera clear  NEURO: alert & oriented x 3 with fluent speech Breast/Chest Wall: She has some residual swelling, but the incision is healing well.  LABORATORY DATA:  I have reviewed the data as listed CBC Latest Ref Rng & Units 01/21/2021 01/01/2021 12/31/2020  WBC 4.0 - 10.5 K/uL 6.2 8.9 8.2  Hemoglobin 12.0 - 15.0 g/dL 11.1(L) 10.3(L) 10.4(L)  Hematocrit 36.0 - 46.0 % 34.6(L) 32.9(L) 32.9(L)  Platelets 150 - 400 K/uL 306 246 245     CMP Latest Ref Rng & Units 01/21/2021 01/01/2021 12/31/2020  Glucose 70 - 99 mg/dL 346(H) 145(H) 247(H)  BUN 8 - 23 mg/dL 26(H) 37(H) 36(H)  Creatinine 0.44 - 1.00 mg/dL 1.25(H) 1.31(H) 1.62(H)  Sodium 135 - 145 mmol/L 139 138 135  Potassium 3.5 - 5.1 mmol/L 4.1 4.2 4.5  Chloride 98 - 111 mmol/L 100 105 101  CO2 22 - 32 mmol/L 29 26 26  Calcium 8.9 - 10.3 mg/dL 10.3 9.8 9.8  Total Protein 6.5  - 8.1 g/dL 6.9 - -  Total Bilirubin 0.3 - 1.2 mg/dL 0.4 - -  Alkaline Phos 38 - 126 U/L 72 - -  AST 15 - 41 U/L 12(L) - -  ALT 0 - 44 U/L 9 - -      RADIOGRAPHIC STUDIES: I have personally reviewed the radiological images as listed and agreed with the findings in the report. No results found.   ASSESSMENT & PLAN:  Kammie Scioli is a 70 y.o. female with   1.Malignant neoplasm of upper-outer quadrant of left breast, StageII, c(T2N1M0), ER+/PR+/HER2+, GradeII, ypT1aN0 -She was diagnosed in 06/2020.She has 12cm calcification and 4.5cm mass in her left breast with2 abnormalLN. 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 -HerMRI Breast from 07/31/20 shows very extensive disease in her left breast and 3 abnormal LNs in the left axilla. There isindeterminate enhancing5mmass within the upper inner right breast. -Her 08/11/20 right breast biopsy showed benign papillary lesion. -Herbone scan from 08/03/20 shows uptake in her lumbar spine, but nonspecific. She has known lumbar back pain from arthritis. Her CT CAP from 08/07/20 showed known breast masses and left axillary LN andnodistant metastasis.  -She started neoadjuvant chemo THP q3weeks on 08/05/20. Due to poor toleration after C1, she was switched to [facility] 09/04/20.C2 postponed due to COVID (+) in early 09/2020.Due to poor toleration of C2 Kadcyla,chemo discontinued after cycle 2. -I recommend Herceptin every 3 weeks to complete one year therapy. Due to her previous diarrhea and relative small benefit of Perjeta, will restart Perjeta. -Exemestane started 12/10/20, she is tolerating well  -She underwent left mastectomy on 12/30/20 under Dr. TGeorgette Dover Pathology showed: focal residual invasive ductal carcinoma s/p neoadjuvant treatment; residual DCIS; margins of resection not involved; all 5 lymph nodes negative (0/5). She had a  minimal residual cancer, excellent response to neoadjuvant chemotherapy.  I reviewed it with patient in detail today. -She had her drain removed recently. There is some residual swelling on exam today, but she is healing well overall. -She has not undergone echocardiogram yet due to recovering from surgery. This is scheduled for 02/03/21. -She is scheduled to follow up with Dr. MLisbeth Renshawon 01/28/21. We discussed the reasoning behind postmastectomy radiation for her today. She is at a higher risk of recurrence due to her positive lymph nodes at diagnosis. -She will proceed with Herceptin today.  2.Diffuse diabetic neuropathy  -She has pre-existing neuropathy, was on Neurontin and Cymbaltabefore cancer diagnosis, got worse after chemo  -She will continue Cymbalta and Neurontin  3. Comorbidities: CHF,poorly controlledDM with neuropathy, HTN, Arthritis in hips -Due to Nausea and stomach pain fromJardiance, she takes Zofran as needed.  -Continue medications and f/u with PCP and Cardiologist Dr. OMarisue Ivan  -She had 2 falls in 2021due to multiple medications and poor sleep. She ambulates with cane.  -Pt is on Lasix which she notes leads to urinary incontinence. She wears depends for this. -Her baseline ECHO from 07/31/20 was normal. Will monitor on Herceptin.Repeat scheduled 02/03/21.  4. Genetic testing, she agreed to proceed but has not proceededwith testingyet.  5. Social Support -She does have decreased memory, she is on medication. Her family help her with history. -[place] apartment[place] other [place] women. Sheis currently able to take care of herself independently. Her daughter is in town and her son is in  [place]. She has other non-biological children.She lives with her husband   81. COVID (+) in early 09/2020.She was asymptomatic and did not require treatment. She tested negative in later 09/2020. She has recovered well   PLAN: -surgical pathology  results reviewed, she had near complete response  -f/u with PT on 01/25/21 -f/u with rad onc on 01/28/21 -echo on 02/03/21 -proceed with trastuzumab today, and continue every 3 weeks -labs and f/u in 9 weeks -continue exemestane   No problem-specific Assessment & Plan notes found for this encounter.   No orders of the defined types were placed in this encounter.  All questions were answered. The patient knows to call the clinic with any problems, questions or concerns. No barriers to learning was detected. The total time spent in the appointment was 30 minutes.     [name], MD 01/21/2021   I, Wilburn Mylar, am acting as scribe for [name], MD.   I have reviewed the above documentation for accuracy and completeness, and I agree with the above.

## 2021-01-17 NOTE — Progress Notes (Deleted)
Cardiology Office Note:   Date:  01/17/2021  NAME:  Cheryl Blankenship    MRN: [id number] DOB:  02/10/1951   PCP:  [name], PA-C  Cardiologist:  No primary care provider on file.  Electrophysiologist:  None   Referring MD: Dayton Scrape*   No chief complaint on file. ***  History of Present Illness:   Cheryl Blankenship is a 70 y.o. female with a hx of HFpEF, DM, HTN, breast ca s/p mastectomy who presents for follow-up. Due for echocardiogram. Did not tolerate chemo for breast cancer. Underwent mastectomy and lymph node dissection 12/30/2020.    Problem List 1. DM -A1c10.1 -Normal stress test, normal echo 08/2019(Cheryl Blankenship) 2. HTN 3. HLD -T chol 154, TG 139, HDL 60, LDL 66 4. Breast CA Stage II -ER+, PR+, HER2+ -currently on neo-adjuvant antiestrogen oral therapy (herceptin/exemestane) -did not tolerate chemo  -L mastectomy with lymph node dissection 12/30/2020 5. HFpEF -EF 65-70%  Past Medical History: Past Medical History:  Diagnosis Date  . Anxiety   . Breast cancer ([place])   . CHF (congestive heart failure) ([place])   . Depression   . Diabetes mellitus without complication ([place])    type 2  . GERD (gastroesophageal reflux disease)   . History of kidney stones   . Hypertension   . PONV (postoperative nausea and vomiting)   . Stroke [facility])     Past Surgical History: Past Surgical History:  Procedure Laterality Date  . APPENDECTOMY    . CHOLECYSTECTOMY    . COLONOSCOPY    . ESOPHAGOGASTRODUODENOSCOPY (EGD) WITH PROPOFOL N/A 11/01/2020   Procedure: ESOPHAGOGASTRODUODENOSCOPY (EGD) WITH PROPOFOL;  Surgeon: Arta Silence, MD;  Location: [facility];  Service: Endoscopy;  Laterality: N/A;  . EYE SURGERY     cataracts  . MASTECTOMY WITH AXILLARY LYMPH NODE DISSECTION Left 12/30/2020   Procedure: LEFT MASTECTOMY WITH TARGETED AXILLARY LYMPH NODE DISSECTION;  Surgeon: [name], MD;  Location: [facility];  Service: General;  Laterality: Left;   Marland Kitchen MULTIPLE TOOTH EXTRACTIONS    . PORTACATH PLACEMENT N/A 08/04/2020   Procedure: INSERTION PORT-A-CATH WITH ULTRASOUND GUIDANCE;  Surgeon: [name], MD;  Location: [facility];  Service: General;  Laterality: N/A;  . STOMACH SURGERY      Current Medications: No outpatient medications have been marked as taking for the 01/18/21 encounter (Appointment) with [name], [name], MD.     Allergies:    Coconut (cocos nucifera) allergy skin test, Hydrochlorothiazide, Lisinopril, and Coconut oil   Social History: Social History   Socioeconomic History  . Marital status: Married    Spouse name: Not on file  . Number of children: 2  . Years of education: Not on file  . Highest education level: Not on file  Occupational History  . Occupation: retired Human resources officer  Tobacco Use  . Smoking status: Former Smoker    Packs/day: 3.00    Years: 30.00    Pack years: 90.00  . Smokeless tobacco: Never Used  Vaping Use  . Vaping Use: Never used  Substance and Sexual Activity  . 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 Drug use: Never  . Sexual activity: Not on file  Other Topics Concern  . Not on file  Social History Narrative  . Not on file   Social Determinants of Health   Financial Resource Strain: Not on file  Food Insecurity: Not on file  Transportation Needs: Not on file  Physical Activity: Not on file  Stress: Not on file  Social Connections: Not on file     Family History: The patient's ***family history includes Cancer in her cousin, cousin, and niece; Diabetes in her sister; Heart attack in her brother, mother, and sister; Ovarian cancer (age of onset: 5) in her mother.  ROS:   All other ROS reviewed and negative. Pertinent positives noted in the HPI.     EKGs/Labs/Other Studies Reviewed:   The following studies were personally reviewed by me today:  EKG:  EKG is *** ordered today.  The ekg ordered today demonstrates  ***, and was personally reviewed by me.   TTE 07/31/2020 1. Left ventricular ejection fraction, by estimation, is 65 to 70%. The  left ventricle has normal function. The left ventricle has no regional  wall motion abnormalities. Left ventricular diastolic parameters are  indeterminate. The average left  ventricular global longitudinal strain is -17.1 %.  2. Right ventricular systolic function is normal. The right ventricular  size is normal. There is normal pulmonary artery systolic pressure.  3. The mitral valve is normal in structure. Trivial mitral valve  regurgitation. No evidence of mitral stenosis.  4. The aortic valve is normal in structure. Aortic valve regurgitation is  not visualized. No aortic stenosis is present.   Recent Labs: 08/25/2020: B Natriuretic Peptide 45.4 10/30/2020: Magnesium 1.8 12/10/2020: ALT 15 01/01/2021: BUN 37; Creatinine, Ser 1.31; Hemoglobin 10.3; Platelets 246; Potassium 4.2; Sodium 138   Recent Lipid Panel    Component Value Date/Time   CHOL 134 10/30/2020 1513   TRIG 165 (H) 10/30/2020 1513   HDL 38 (L) 10/30/2020 1513   CHOLHDL 3.5 10/30/2020 1513   VLDL 33 10/30/2020 1513   LDLCALC 63 10/30/2020 1513    Physical Exam:   VS:  There were no vitals taken for this visit.   Wt Readings from Last 3 Encounters:  12/30/20 206 lb (93.4 kg)  12/10/20 209 lb 1.6 oz (94.8 kg)  11/26/20 206 lb 3.2 oz (93.5 kg)    General: Well nourished, well developed, in no acute distress Head: Atraumatic, normal size  Eyes: PEERLA, EOMI  Neck: Supple, no JVD Endocrine: No thryomegaly Cardiac: Normal S1, S2; RRR; no murmurs, rubs, or gallops Lungs: Clear to auscultation bilaterally, no wheezing, rhonchi or rales  Abd: Soft, nontender, no hepatomegaly  Ext: No edema, pulses 2+ Musculoskeletal: No deformities, BUE and BLE strength normal and equal Skin: Warm and dry, no rashes   Neuro: Alert and oriented to person, place, time, and situation, CNII-XII grossly  intact, no focal deficits  Psych: Normal mood and affect   ASSESSMENT:   Cheryl Blankenship is a 70 y.o. female who presents for the following: No diagnosis found.  PLAN:   There are no diagnoses linked to this encounter.  Disposition: No follow-ups on file.  Medication Adjustments/Labs and Tests Ordered: Current medicines are reviewed at length with the patient today.  Concerns regarding medicines are outlined above.  No orders of the defined types were placed in this encounter.  No orders of the defined types were placed in this encounter.   There are no Patient Instructions on file for this visit.   Time Spent with Patient: I have spent a total of *** minutes with patient reviewing hospital notes, telemetry, EKGs, labs and examining the patient as well as establishing an assessment and plan that was discussed with the patient.  > 50% of time was spent in direct patient care.  Signed,  Cheryl Bells T. Audie Box, MD, [place]  [address], [place], [place] [address] [phone]  01/17/2021 3:43 PM

## 2021-01-18 ENCOUNTER — Ambulatory Visit: Payer: Medicare HMO | Admitting: Cardiovascular Disease

## 2021-01-18 DIAGNOSIS — Z09 Encounter for follow-up examination after completed treatment for conditions other than malignant neoplasm: Secondary | ICD-10-CM

## 2021-01-18 DIAGNOSIS — E782 Mixed hyperlipidemia: Secondary | ICD-10-CM

## 2021-01-18 DIAGNOSIS — I5032 Chronic diastolic (congestive) heart failure: Secondary | ICD-10-CM

## 2021-01-18 DIAGNOSIS — I10 Essential (primary) hypertension: Secondary | ICD-10-CM

## 2021-01-19 ENCOUNTER — Encounter: Payer: Self-pay | Admitting: *Deleted

## 2021-01-19 ENCOUNTER — Other Ambulatory Visit: Payer: Self-pay | Admitting: Nurse Practitioner

## 2021-01-19 DIAGNOSIS — C50412 Malignant neoplasm of upper-outer quadrant of left female breast: Secondary | ICD-10-CM

## 2021-01-19 DIAGNOSIS — Z170 Estrogen receptor positive status [ER+]: Secondary | ICD-10-CM

## 2021-01-20 ENCOUNTER — Telehealth (HOSPITAL_COMMUNITY): Payer: Self-pay | Admitting: Cardiovascular Disease

## 2021-01-20 NOTE — Telephone Encounter (Signed)
Sent to primary nurse as FYI 

## 2021-01-20 NOTE — Telephone Encounter (Signed)
Just an FYI. We have made several attempts to contact this patient including sending a letter to schedule or reschedule their echocardiogram. We will be removing the patient from the echo [place].   01/06/21 No SHOWED-MAILED LETTER [facility]    Thank you

## 2021-01-20 NOTE — Telephone Encounter (Signed)
Noted. Thanks.

## 2021-01-21 ENCOUNTER — Inpatient Hospital Stay (HOSPITAL_BASED_OUTPATIENT_CLINIC_OR_DEPARTMENT_OTHER): Payer: Medicare HMO | Admitting: Hematology

## 2021-01-21 ENCOUNTER — Other Ambulatory Visit: Payer: Medicare HMO

## 2021-01-21 ENCOUNTER — Inpatient Hospital Stay: Payer: Medicare HMO

## 2021-01-21 ENCOUNTER — Inpatient Hospital Stay: Payer: Medicare HMO | Attending: Hematology

## 2021-01-21 ENCOUNTER — Encounter: Payer: Self-pay | Admitting: Hematology

## 2021-01-21 ENCOUNTER — Other Ambulatory Visit: Payer: Self-pay

## 2021-01-21 VITALS — BP 110/82 | HR 68 | Temp 98.8°F | Resp 17 | Ht 63.0 in | Wt 209.0 lb

## 2021-01-21 DIAGNOSIS — Z170 Estrogen receptor positive status [ER+]: Secondary | ICD-10-CM

## 2021-01-21 DIAGNOSIS — R110 Nausea: Secondary | ICD-10-CM | POA: Diagnosis not present

## 2021-01-21 DIAGNOSIS — C50412 Malignant neoplasm of upper-outer quadrant of left female breast: Secondary | ICD-10-CM

## 2021-01-21 DIAGNOSIS — Z5112 Encounter for antineoplastic immunotherapy: Secondary | ICD-10-CM | POA: Insufficient documentation

## 2021-01-21 LAB — CBC WITH DIFFERENTIAL (CANCER CENTER ONLY)
Abs Immature Granulocytes: 0.02 10*3/uL (ref 0.00–0.07)
Basophils Absolute: 0 10*3/uL (ref 0.0–0.1)
Basophils Relative: 1 %
Eosinophils Absolute: 0.2 10*3/uL (ref 0.0–0.5)
Eosinophils Relative: 4 %
HCT: 34.6 % — ABNORMAL LOW (ref 36.0–46.0)
Hemoglobin: 11.1 g/dL — ABNORMAL LOW (ref 12.0–15.0)
Immature Granulocytes: 0 %
Lymphocytes Relative: 38 %
Lymphs Abs: 2.3 10*3/uL (ref 0.7–4.0)
MCH: 28.1 pg (ref 26.0–34.0)
MCHC: 32.1 g/dL (ref 30.0–36.0)
MCV: 87.6 fL (ref 80.0–100.0)
Monocytes Absolute: 0.4 10*3/uL (ref 0.1–1.0)
Monocytes Relative: 6 %
Neutro Abs: 3.3 10*3/uL (ref 1.7–7.7)
Neutrophils Relative %: 51 %
Platelet Count: 306 10*3/uL (ref 150–400)
RBC: 3.95 MIL/uL (ref 3.87–5.11)
RDW: 13 % (ref 11.5–15.5)
WBC Count: 6.2 10*3/uL (ref 4.0–10.5)
nRBC: 0 % (ref 0.0–0.2)

## 2021-01-21 LAB — CMP (CANCER CENTER ONLY)
ALT: 9 U/L (ref 0–44)
AST: 12 U/L — ABNORMAL LOW (ref 15–41)
Albumin: 3.5 g/dL (ref 3.5–5.0)
Alkaline Phosphatase: 72 U/L (ref 38–126)
Anion gap: 10 (ref 5–15)
BUN: 26 mg/dL — ABNORMAL HIGH (ref 8–23)
CO2: 29 mmol/L (ref 22–32)
Calcium: 10.3 mg/dL (ref 8.9–10.3)
Chloride: 100 mmol/L (ref 98–111)
Creatinine: 1.25 mg/dL — ABNORMAL HIGH (ref 0.44–1.00)
GFR, Estimated: 47 mL/min — ABNORMAL LOW (ref >60)
Glucose, Bld: 346 mg/dL — ABNORMAL HIGH (ref 70–99)
Potassium: 4.1 mmol/L (ref 3.5–5.1)
Sodium: 139 mmol/L (ref 135–145)
Total Bilirubin: 0.4 mg/dL (ref 0.3–1.2)
Total Protein: 6.9 g/dL (ref 6.5–8.1)

## 2021-01-21 MED ORDER — ACETAMINOPHEN 325 MG PO TABS
650.0000 mg | ORAL_TABLET | Freq: Once | ORAL | Status: AC
  Administered 2021-01-21: 650 mg via ORAL

## 2021-01-21 MED ORDER — SODIUM CHLORIDE 0.9 % IV SOLN
INTRAVENOUS | Status: DC
  Filled 2021-01-21: qty 250

## 2021-01-21 MED ORDER — SODIUM CHLORIDE 0.9 % IV SOLN
25.0000 mg | Freq: Once | INTRAVENOUS | Status: AC
  Administered 2021-01-21: 25 mg via INTRAVENOUS
  Filled 2021-01-21: qty 1

## 2021-01-21 MED ORDER — SODIUM CHLORIDE 0.9% FLUSH
10.0000 mL | Freq: Once | INTRAVENOUS | Status: DC | PRN
  Filled 2021-01-21: qty 10

## 2021-01-21 MED ORDER — ACETAMINOPHEN 325 MG PO TABS
ORAL_TABLET | ORAL | Status: AC
  Filled 2021-01-21: qty 2

## 2021-01-21 MED ORDER — TRASTUZUMAB-ANNS CHEMO 150 MG IV SOLR
750.0000 mg | Freq: Once | INTRAVENOUS | Status: AC
  Administered 2021-01-21: 750 mg via INTRAVENOUS
  Filled 2021-01-21: qty 35.72

## 2021-01-21 MED ORDER — SODIUM CHLORIDE 0.9 % IV SOLN
25.0000 mg | Freq: Once | INTRAVENOUS | Status: DC
  Filled 2021-01-21: qty 1

## 2021-01-21 MED ORDER — DIPHENHYDRAMINE HCL 25 MG PO CAPS
50.0000 mg | ORAL_CAPSULE | Freq: Once | ORAL | Status: AC
  Administered 2021-01-21: 50 mg via ORAL

## 2021-01-21 MED ORDER — DIPHENHYDRAMINE HCL 25 MG PO CAPS
ORAL_CAPSULE | ORAL | Status: AC
  Filled 2021-01-21: qty 2

## 2021-01-21 MED ORDER — SODIUM CHLORIDE 0.9% FLUSH
10.0000 mL | INTRAVENOUS | Status: DC | PRN
  Administered 2021-01-21: 10 mL
  Filled 2021-01-21: qty 10

## 2021-01-21 MED ORDER — HEPARIN SOD (PORK) LOCK FLUSH 100 UNIT/ML IV SOLN
500.0000 [IU] | Freq: Once | INTRAVENOUS | Status: AC | PRN
  Administered 2021-01-21: 500 [IU]
  Filled 2021-01-21: qty 5

## 2021-01-21 NOTE — Progress Notes (Signed)
Per Dr. Burr Medico, no need for extra IV fluids today.

## 2021-01-21 NOTE — Patient Instructions (Addendum)
[facility]  Discharge Instructions: ?Thank you for choosing [facility] to provide your oncology and hematology care.  ? ?If you have a lab appointment with the Cancer Center, please go directly to the Cancer Center and check in at the registration area. ?  ?Wear comfortable clothing and clothing appropriate for easy access to any Portacath or PICC line.  ? ?We strive to give you quality time with your provider. You may need to reschedule your appointment if you arrive late (15 or more minutes).  Arriving late affects you and other patients whose appointments are after yours.  Also, if you miss three or more appointments without notifying the office, you may be dismissed from the clinic at the provider?s discretion.    ?  ?For prescription refill requests, have your pharmacy contact our office and allow 72 hours for refills to be completed.   ? ?Today you received the following chemotherapy and/or immunotherapy agents: Trastuzumab    ?  ?To help prevent nausea and vomiting after your treatment, we encourage you to take your nausea medication as directed. ? ?BELOW ARE SYMPTOMS THAT SHOULD BE REPORTED IMMEDIATELY: ?*FEVER GREATER THAN 100.4 F (38 ?C) OR HIGHER ?*CHILLS OR SWEATING ?*NAUSEA AND VOMITING THAT IS NOT CONTROLLED WITH YOUR NAUSEA MEDICATION ?*UNUSUAL SHORTNESS OF BREATH ?*UNUSUAL BRUISING OR BLEEDING ?*URINARY PROBLEMS (pain or burning when urinating, or frequent urination) ?*BOWEL PROBLEMS (unusual diarrhea, constipation, pain near the anus) ?TENDERNESS IN MOUTH AND THROAT WITH OR WITHOUT PRESENCE OF ULCERS (sore throat, sores in mouth, or a toothache) ?UNUSUAL RASH, SWELLING OR PAIN  ?UNUSUAL VAGINAL DISCHARGE OR ITCHING  ? ?Items with * indicate a potential emergency and should be followed up as soon as possible or go to the Emergency Department if any problems should occur. ? ?Please show the CHEMOTHERAPY ALERT CARD or IMMUNOTHERAPY ALERT CARD at check-in  to the Emergency Department and triage nurse. ? ?Should you have questions after your visit or need to cancel or reschedule your appointment, please contact [facility]  Dept: [phone]  and follow the prompts.  Office hours are 8:00 a.m. to 4:30 p.m. Monday - Friday. Please note that voicemails left after 4:00 p.m. may not be returned until the following business day.  We are closed weekends and major holidays. You have access to a nurse at all times for urgent questions. Please call the main number to the clinic Dept: [phone] and follow the prompts. ? ? ?For any non-urgent questions, you may also contact your provider using MyChart. We now offer e-Visits for anyone 18 and older to request care online for non-urgent symptoms. For details visit [email or web address]. ?  ?Also download the MyChart app! Go to the app store, search "MyChart", open the app, select [facility], and log in with your MyChart username and password. ? ?Due to Covid, a mask is required upon entering the hospital/clinic. If you do not have a mask, one will be given to you upon arrival. For doctor visits, patients may have 1 support person aged 18 or older with them. For treatment visits, patients cannot have anyone with them due to current Covid guidelines and our immunocompromised population.  ? ?

## 2021-01-22 ENCOUNTER — Telehealth: Payer: Self-pay | Admitting: Hematology

## 2021-01-22 NOTE — Telephone Encounter (Signed)
Left message with follow-up appointments per 5/5 los. Gave option to call back to reschedule if needed.

## 2021-01-25 ENCOUNTER — Ambulatory Visit: Payer: Medicare HMO | Attending: Hematology | Admitting: Physical Therapy

## 2021-01-25 DIAGNOSIS — I890 Lymphedema, not elsewhere classified: Secondary | ICD-10-CM | POA: Insufficient documentation

## 2021-01-25 DIAGNOSIS — M6281 Muscle weakness (generalized): Secondary | ICD-10-CM | POA: Insufficient documentation

## 2021-01-25 DIAGNOSIS — R262 Difficulty in walking, not elsewhere classified: Secondary | ICD-10-CM | POA: Insufficient documentation

## 2021-01-25 DIAGNOSIS — M25612 Stiffness of left shoulder, not elsewhere classified: Secondary | ICD-10-CM | POA: Insufficient documentation

## 2021-01-25 DIAGNOSIS — Z483 Aftercare following surgery for neoplasm: Secondary | ICD-10-CM | POA: Insufficient documentation

## 2021-01-25 DIAGNOSIS — M25611 Stiffness of right shoulder, not elsewhere classified: Secondary | ICD-10-CM | POA: Insufficient documentation

## 2021-01-28 ENCOUNTER — Ambulatory Visit
Admission: RE | Admit: 2021-01-28 | Discharge: 2021-01-28 | Disposition: A | Discharge status: Home / Self Care | Payer: Medicare HMO | Source: Ambulatory Visit | Attending: Radiation Oncology | Admitting: Radiation Oncology

## 2021-01-28 ENCOUNTER — Other Ambulatory Visit: Payer: Self-pay

## 2021-01-28 DIAGNOSIS — C50412 Malignant neoplasm of upper-outer quadrant of left female breast: Secondary | ICD-10-CM

## 2021-01-28 NOTE — Progress Notes (Signed)
Patients is aware that this is a phone visit . Meaningful use is complete

## 2021-01-28 NOTE — Progress Notes (Signed)
Radiation Oncology         [phone] ________________________________  Outpatient Follow Up - Conducted via telephone due to current COVID-19 concerns for limiting patient exposure  I spoke with the patient to conduct this consult visit via telephone to spare the patient unnecessary potential exposure in the healthcare setting during the current COVID-19 pandemic. The patient was notified in advance and was offered a [place] meeting to allow for face to face communication but unfortunately reported that they did not have the appropriate resources/technology to support such a visit and instead preferred to proceed with a telephone visit.  Name: Cheryl Blankenship        MRN: [id number]  Date of Service: 01/28/2021 DOB: 04/24/1951  CC:Cheryl Devoid, PA-C  [name], MD     REFERRING PHYSICIAN: [name], MD   DIAGNOSIS: The encounter diagnosis was Malignant neoplasm of upper-outer quadrant of left breast in female, estrogen receptor positive ([place]).   HISTORY OF PRESENT ILLNESS: Cheryl Blankenship is a 70 y.o. female originally seen in the multidisciplinary breast clinic for a new diagnosis of left breast cancer. The patient was noted to have screening detected calcifications and abnormal appearing lymph nodes.  She underwent further diagnostic work-up which revealed a 12 cm span of highly suspicious calcifications in the upper outer and upper inner quadrants of the left breast, within this area by ultrasound there are confluent masses at the 1 o'clock position spanning at least 4.5 cm and an additional suspicious mass in the left breast at 2:00 was identified.  This location was 7 cm from the nipple and measured 9 mm in greatest dimension.  There were 2 left axillary lymph nodes that had cortical thickening and additional lymph node with a borderline cortex of 4 mm, totaling approximately 3 abnormal appearing nodes.  She underwent a biopsy on 07/16/2020, within the 1 o'clock position an  invasive ductal carcinoma that was grade 2 was biopsied, the tumor was ER/PR positive, HER-2 was also amplified and her Ki-67 was between 25 to 30%.  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     Since her last visit she did undergo MRI work-up showing extensive non-mass enhancement and 3 abnormal appearing lymph nodes in the left axilla, she also had a right papillary lesion.  She received systemic chemotherapy between 08/05/2020 and 10/19/2020.  Her post treatment MRI showed improvement in her primary tumor and improvement in the size of the 3 abnormal lymph nodes.  She subsequently underwent left mastectomy with targeted axillary lymph node dissection on 12/30/2020, final pathology revealed focal residual invasive ductal carcinoma and residual DCIS, her margins were clear and a total of 5 lymph nodes were negative in the axilla.  Her tumor was retested and was again triple positive.  Her case was also discussed in multidisciplinary breast oncology conference and it was recommended that she receive antiestrogen plus Perjeta and she is contacted today to discuss adjuvant radiotherapy.    PREVIOUS RADIATION THERAPY: No   PAST MEDICAL HISTORY:  Past Medical History:  Diagnosis Date  . Anxiety   . Breast cancer ([place])   . CHF (congestive heart failure) ([place])   . Depression   . Diabetes mellitus without complication ([place])    type 2  . GERD (gastroesophageal reflux disease)   . History of kidney stones   . Hypertension   .  PONV (postoperative nausea and vomiting)   . Stroke [facility])        PAST SURGICAL HISTORY: Past Surgical History:  Procedure Laterality Date  . APPENDECTOMY    . CHOLECYSTECTOMY    . COLONOSCOPY    . ESOPHAGOGASTRODUODENOSCOPY (EGD) WITH PROPOFOL N/A 11/01/2020   Procedure: ESOPHAGOGASTRODUODENOSCOPY (EGD) WITH  PROPOFOL;  Surgeon: Arta Silence, MD;  Location: [facility];  Service: Endoscopy;  Laterality: N/A;  . EYE SURGERY     cataracts  . MASTECTOMY WITH AXILLARY LYMPH NODE DISSECTION Left 12/30/2020   Procedure: LEFT MASTECTOMY WITH TARGETED AXILLARY LYMPH NODE DISSECTION;  Surgeon: [name], MD;  Location: [facility];  Service: General;  Laterality: Left;  Marland Kitchen MULTIPLE TOOTH EXTRACTIONS    . PORTACATH PLACEMENT N/A 08/04/2020   Procedure: INSERTION PORT-A-CATH WITH ULTRASOUND GUIDANCE;  Surgeon: [name], MD;  Location: [facility];  Service: General;  Laterality: N/A;  . STOMACH SURGERY       FAMILY HISTORY:  Family History  Problem Relation Age of Onset  . Heart attack Sister   . Diabetes Sister   . Heart attack Mother   . Ovarian cancer Mother 4  . Heart attack Brother   . Cancer Niece        bone cancer   . Cancer Cousin        unknown type cancer   . Cancer Cousin        metastatic cancer to bone      SOCIAL HISTORY:  reports that she has quit smoking. She has a 90.00 pack-year smoking history. She has never used smokeless tobacco. She reports previous alcohol use. She reports that she does not use drugs. The patient is married and resides in [place]. In 2021 she relocated to [place] from [place] to be closer to her family.  ALLERGIES: Coconut (cocos nucifera) allergy skin test, Hydrochlorothiazide, Lisinopril, and Coconut oil   MEDICATIONS:  Current Outpatient Medications  Medication Sig Dispense Refill  . Accu-Chek FastClix Lancets MISC     . albuterol (VENTOLIN HFA) 108 (90 Base) MCG/ACT inhaler Inhale 1-2 puffs into the lungs every 4 (four) hours as needed for wheezing or shortness of breath. 8 g 0  . ALPRAZolam (XANAX) 0.25 MG tablet Take 1-2 tablets (0.25-0.5 mg total) by mouth 3 (three) times daily as needed for anxiety or sleep. 30 tablet 0  . aspirin 81 MG chewable tablet Chew 81 mg by mouth daily.     . Cholecalciferol 25 MCG (1000 UT) capsule Take 1,000  Units by mouth daily.    . diphenoxylate-atropine (LOMOTIL) 2.5-0.025 MG tablet Take 1 tablet by mouth 4 (four) times daily as needed for diarrhea or loose stools.    . empagliflozin (JARDIANCE) 10 MG TABS tablet Take 10 mg by mouth daily.    Marland Kitchen exemestane (AROMASIN) 25 MG tablet Take 1 tablet (25 mg total) by mouth daily after breakfast. 30 tablet 2  . folic acid (FOLVITE) 1 MG tablet Take 1 mg by mouth daily.    . furosemide (LASIX) 20 MG tablet Take 40 mg by mouth daily.    Marland Kitchen gabapentin (NEURONTIN) 400 MG capsule Take 400 mg by mouth 3 (three) times daily.     Marland Kitchen glipiZIDE (GLUCOTROL XL) 5 MG 24 hr tablet Take 5 mg by mouth daily with breakfast.    . insulin aspart (NOVOLOG) 100 UNIT/ML injection Inject 2-10 Units into the skin 3 (three) times daily before meals. Per sliding scale:  150-199 = 2 units, 200-249 = 4  units, 250-299 = 6 units, 300-349 = 8 units, greater than350 = 10 units.    . insulin glargine (LANTUS SOLOSTAR) 100 UNIT/ML Solostar Pen Inject 35 Units into the skin 2 (two) times daily. (Patient taking differently: Inject 70 Units into the skin 2 (two) times daily. Pt takes 70 units in the morning and 70 units at night.) 15 mL 11  . lidocaine-prilocaine (EMLA) cream Apply 1 application topically as needed (pain). 30 g 3  . losartan (COZAAR) 100 MG tablet Take 1 tablet (100 mg total) by mouth daily. 90 tablet 1  . metFORMIN (GLUCOPHAGE) 500 MG tablet Take 500 mg by mouth 2 (two) times daily with a meal.     . methocarbamol (ROBAXIN) 500 MG tablet Take 500 mg by mouth every 8 (eight) hours as needed for muscle spasms.    . Nutritional Supplements (FEEDING SUPPLEMENT, GLUCERNA 1.2 CAL,) LIQD Take 237 mLs by mouth 3 (three) times daily with meals.    . Omega-3 Fatty Acids (FISH OIL) 1000 MG CAPS Take 1,000 mg by mouth daily.    Marland Kitchen omeprazole (PRILOSEC) 20 MG capsule Take 1 capsule (20 mg total) by mouth daily. 30 capsule 11  . ondansetron (ZOFRAN-ODT) 4 MG disintegrating tablet Take 1 tablet  (4 mg total) by mouth 2 (two) times daily as needed for up to 30 doses for nausea or vomiting. 30 tablet 0  . potassium chloride (KLOR-CON) 10 MEQ tablet Take 20 mEq by mouth daily.    . prochlorperazine (COMPAZINE) 10 MG tablet Take 1 tablet (10 mg total) by mouth every 6 (six) hours as needed for nausea or vomiting. 30 tablet 1  . RESTASIS 0.05 % ophthalmic emulsion Place 1 drop into both eyes 2 (two) times daily as needed (dry eyes).    Marland Kitchen spironolactone (ALDACTONE) 25 MG tablet Take 1 tablet (25 mg total) by mouth daily. 90 tablet 1  . vitamin B-12 (CYANOCOBALAMIN) 1000 MCG tablet Take 1,000 mcg by mouth daily.    Marland Kitchen zolpidem (AMBIEN) 5 MG tablet Take 1 tablet (5 mg total) by mouth at bedtime as needed for sleep. 20 tablet 0  . amLODipine (NORVASC) 10 MG tablet Take 1 tablet (10 mg total) by mouth daily. (Patient not taking: No sig reported) 30 tablet 10  . bisacodyl (DULCOLAX) 5 MG EC tablet Take 1 tablet (5 mg total) by mouth daily as needed for moderate constipation. (Patient not taking: No sig reported) 30 tablet 0  . carvedilol (COREG) 25 MG tablet Take 1 tablet (25 mg total) by mouth 2 (two) times daily with a meal. 180 tablet 3  . oxyCODONE-acetaminophen (PERCOCET) 5-325 MG tablet Take 1 tablet by mouth every 4 (four) hours as needed for severe pain. 20 tablet 0  . promethazine (PHENERGAN) 25 MG suppository Place 1 suppository (25 mg total) rectally every 8 (eight) hours as needed for nausea or vomiting. (Patient not taking: Reported on 01/21/2021) 30 each 1  . sucralfate (CARAFATE) 1 GM/10ML suspension Take 10 mLs (1 g total) by mouth 4 (four) times daily -  with meals and at bedtime. (Patient not taking: No sig reported) 420 mL 0   No current facility-administered medications for this encounter.     REVIEW OF SYSTEMS: On review of systems, the patient reports that she is having some fullness in her chest wall and left upper extremity. She denies any other concerns but notes the swelling  came on about 2-3 days ago.     PHYSICAL EXAM:  Unable to assess  due to encounter type.    ECOG = 0  0 - Asymptomatic (Fully active, able to carry on all predisease activities without restriction)  1 - Symptomatic but completely ambulatory (Restricted in physically strenuous activity but ambulatory and able to carry out work of a light or sedentary nature. For example, light housework, office work)  2 - Symptomatic, <50% in bed during the day (Ambulatory and capable of all self care but unable to carry out any work activities. Up and about more than 50% of waking hours)  3 - Symptomatic, >50% in bed, but not bedbound (Capable of only limited self-care, confined to bed or chair 50% or more of waking hours)  4 - Bedbound (Completely disabled. Cannot carry on any self-care. Totally confined to bed or chair)  5 - Death   Eustace Pen MM, Creech RH, Tormey DC, et al. [phone]). "Toxicity and response criteria of the [facility]". [place] Oncol. 5 (6): 649-55    LABORATORY DATA:  Lab Results  Component Value Date   WBC 6.2 01/21/2021   HGB 11.1 (L) 01/21/2021   HCT 34.6 (L) 01/21/2021   MCV 87.6 01/21/2021   PLT 306 01/21/2021   Lab Results  Component Value Date   NA 139 01/21/2021   K 4.1 01/21/2021   CL 100 01/21/2021   CO2 29 01/21/2021   Lab Results  Component Value Date   ALT 9 01/21/2021   AST 12 (L) 01/21/2021   ALKPHOS 72 01/21/2021   BILITOT 0.4 01/21/2021      RADIOGRAPHY: MM BREAST SURGICAL SPECIMEN  Result Date: 12/30/2020 CLINICAL DATA:  Status post targeted axillary lymph node dissection today after earlier radioactive seed localization. EXAM: SPECIMEN RADIOGRAPH OF THE LEFT AXILLA COMPARISON:  Previous exam(s). FINDINGS: Status post excision of the LEFT axilla. The radioactive seed is present, completely intact, and were marked for pathology. IMPRESSION: Specimen radiograph of the left axilla. Electronically Signed   By: Franki Cabot M.D.    On: 12/30/2020 10:05   MM Breast Surgical Specimen  Result Date: 12/30/2020 CLINICAL DATA:  Status post targeted lymph node dissection today after earlier radioactive seed localization. EXAM: SPECIMEN RADIOGRAPH OF THE LEFT BREAST COMPARISON:  Previous exam(s). FINDINGS: Status post excision of the left axilla. The radioactive seed and biopsy marker clip are present, completely intact, and were marked for pathology. Findings discussed with the OR staff during the procedure. IMPRESSION: Specimen radiograph of the left axilla. Electronically Signed   By: Franki Cabot M.D.   On: 12/30/2020 09:45   MM LT RADIOACTIVE SEED LOC MAMMO GUIDE  Result Date: 12/29/2020 CLINICAL DATA:  Patient is scheduled for targeted LEFT axillary lymph node dissection tomorrow. Ultrasound-guided radioactive seed placement was performed earlier today. Subsequent postprocedural mammogram showed that the radioactive seed was placed into a lymph node which did not correspond to the lymph node biopsied on 07/16/2020 (containing a heart shaped clip). Now, using mammographic guidance, attempt will be made to place a radioactive seed into the lymph node which contains the heart shaped clip. EXAM: MAMMOGRAPHIC GUIDED RADIOACTIVE SEED LOCALIZATION OF THE LEFT AXILLA COMPARISON:  Previous exam(s). FINDINGS: Patient presents for radioactive seed localization prior to targeted lymph node dissection. I met with the patient and we discussed the procedure of seed localization including benefits and alternatives. We discussed the high likelihood of a successful procedure. We discussed the risks of the procedure including infection, bleeding, tissue injury and further surgery. We discussed the low dose of radioactivity involved in  the procedure. Informed, written consent was given. The usual time-out protocol was performed immediately prior to the procedure. Using mammographic guidance, sterile technique, 1% lidocaine and an I-125 radioactive seed,  the heart shaped clip within the lower LEFT axilla was localized using a superior approach. The follow-up mammogram images confirm the seed in the expected location and were marked for Dr. [name]. Follow-up survey of the patient confirms presence of the radioactive seed. Order number of I-125 seed:  [id number]. Total activity:  2.353 millicuries reference Date: 12/21/2020 The patient tolerated the procedure well and was released from the [facility]. She was given instructions regarding seed removal. IMPRESSION: Radioactive seed localization left breast. No apparent complications. There are now 2 radioactive seeds within 2 separate lymph nodes in the LEFT axilla. This was discussed with Dr. [name] on 12/29/2020 at 3:40 p.m. Electronically Signed   By: Franki Cabot M.D.   On: 12/29/2020 15:49   [place] LT RADIOACTIVE SEED LOC  Result Date: 12/29/2020 CLINICAL DATA:  Patient is scheduled for targeted LEFT axillary lymph node dissection requiring preoperative radioactive seed localization. EXAM: ULTRASOUND GUIDED RADIOACTIVE SEED LOCALIZATION OF THE LEFT AXILLA COMPARISON:  Previous exam(s). FINDINGS: Patient presents for radioactive seed localization prior to targeted lymph node dissection. I met with the patient and we discussed the procedure of seed localization including benefits and alternatives. We discussed the high likelihood of a successful procedure. We discussed the risks of the procedure including infection, bleeding, tissue injury and further surgery. We discussed the low dose of radioactivity involved in the procedure. Informed, written consent was given. The usual time-out protocol was performed immediately prior to the procedure. Using ultrasound guidance, sterile technique, 1% lidocaine and an I-125 radioactive seed, the axillary lymph node which appeared to contain a biopsy clip was localized using a lateral approach. The follow-up mammogram images confirm the seed to be within a different lymph node  than the originally biopsied lymph node, and this was marked for Dr. [name]. Follow-up survey of the patient confirms presence of the radioactive seed. Order number of I-125 seed:  [id number]. Total activity:  0.867 millicuries reference Date: 12/21/2020 The patient tolerated the procedure well and was released from the [facility]. She was given instructions regarding seed removal. IMPRESSION: 1. Radioactive seed localization left axilla. No apparent complications. 2. Postprocedure mammogram shows that this radioactive seed placement is within a morphologically abnormal lymph node does not correspond to the originally biopsied lymph node. As such, additional attempt will be made to place a second (separate) radioactive seed into the lymph node containing the heart shaped clip using mammographic guidance. Additional procedure report to follow. Electronically Signed   By: Franki Cabot M.D.   On: 12/29/2020 15:29   MM CLIP PLACEMENT LEFT  Result Date: 12/29/2020 CLINICAL DATA:  Status post ultrasound-guided radioactive seed placement. Patient is scheduled for a targeted LEFT axillary lymph node dissection tomorrow. EXAM: DIAGNOSTIC LEFT MAMMOGRAM POST ULTRASOUND-GUIDED RADIOACTIVE SEED PLACEMENT COMPARISON:  Previous exam(s). FINDINGS: Mammographic images were obtained following ultrasound-guided radioactive seed placement. These demonstrate that the radioactive seed has been placed into a different morphologically abnormal lymph node than the lymph node which was biopsied on 07/16/2020. IMPRESSION: Radioactive seed placed today using ultrasound guidance is located within a different morphologically abnormal lymph node than the lymph node which was biopsied on 07/16/2020. As such, an additional radioactive seed will be placed into the lymph node containing the heart shaped clip using mammographic guidance. Final Assessment: Post Procedure Mammograms for Seed Placement Electronically  Signed   By: Franki Cabot  M.D.   On: 12/29/2020 15:42       IMPRESSION/PLAN: 1. 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 Dr. Lisbeth Renshaw discusses the patient's course since our last visit and the final results of pathology. She has done well since completing chemotherapy and is healing well per report from her surgery. She would benefit from external radiotherapy to the chest wall and regional nodes  to reduce risks of local recurrence followed by antiestrogen therapy. She will continue targeted HER2 therapy. We discussed the risks, benefits, short, and long term effects of radiotherapy, as well as the curative intent, and the patient is interested in proceeding. Dr. Lisbeth Renshaw discusses the delivery and logistics of radiotherapy and anticipates a course of 6 1/2 weeks of radiotherapy. She will come in for simulation on 02/08/21 at which time she will sign written consent to proceed. 2. Possible Lymphedema. The assessment is limited due to the encounter type. I reached out to PT to see if they can work her in tomorrow or early next week to help with what sounds to be lymphedema. We would like for this to improve/resolve prior to proceeding with radiotherapy.   Given current concerns for patient exposure during the COVID-19 pandemic, this encounter was conducted via telephone.  The patient has provided two factor identification and has given verbal consent for this type of encounter and has been advised to only accept a meeting of this type in a secure network environment. The time spent during this encounter was 45 minutes including preparation, discussion, and coordination of the patient's care. The attendants for this meeting include  Dr. Lisbeth Renshaw, Hayden Pedro  and Robinn Overholt and her daughter Shaune Pascal. During the encounter, Dr. Lisbeth Renshaw, and Hayden Pedro were located at [facility].  Annalena Piatt was located at home with her daughter Jessica Priest.    The above documentation reflects my direct findings during this shared patient visit. Please see the separate note by Dr. Lisbeth Renshaw on this date for the remainder of the patient's plan of care.    Carola Rhine, PAC

## 2021-02-02 ENCOUNTER — Encounter: Payer: Self-pay | Admitting: *Deleted

## 2021-02-02 ENCOUNTER — Ambulatory Visit: Payer: Medicare HMO | Admitting: Rehabilitation

## 2021-02-03 ENCOUNTER — Telehealth: Payer: Self-pay

## 2021-02-03 ENCOUNTER — Inpatient Hospital Stay (HOSPITAL_COMMUNITY): Admission: RE | Admit: 2021-02-03 | Payer: Medicare HMO | Source: Ambulatory Visit

## 2021-02-03 NOTE — Telephone Encounter (Signed)
Cheryl Blankenship left vm stating she needed to cancel appt today because of transportation issues.  This appt was for her echocardiogram.  I have reschedule this appt to be the same day as her infusion appt on 5/27. I spoke with her daughter Cheryl Blankenship.  She verbalized understanding.

## 2021-02-08 ENCOUNTER — Ambulatory Visit
Admission: RE | Admit: 2021-02-08 | Discharge: 2021-02-08 | Disposition: A | Discharge status: Home / Self Care | Payer: Medicare HMO | Source: Ambulatory Visit | Attending: Radiation Oncology | Admitting: Radiation Oncology

## 2021-02-08 ENCOUNTER — Other Ambulatory Visit: Payer: Self-pay

## 2021-02-08 DIAGNOSIS — Z510 Encounter for antineoplastic radiation therapy: Secondary | ICD-10-CM | POA: Diagnosis present

## 2021-02-08 DIAGNOSIS — C50412 Malignant neoplasm of upper-outer quadrant of left female breast: Secondary | ICD-10-CM | POA: Diagnosis present

## 2021-02-08 DIAGNOSIS — Z170 Estrogen receptor positive status [ER+]: Secondary | ICD-10-CM | POA: Diagnosis not present

## 2021-02-09 ENCOUNTER — Ambulatory Visit: Payer: Medicare HMO | Admitting: Physical Therapy

## 2021-02-09 ENCOUNTER — Other Ambulatory Visit (HOSPITAL_COMMUNITY): Payer: Self-pay | Admitting: Student

## 2021-02-09 DIAGNOSIS — R262 Difficulty in walking, not elsewhere classified: Secondary | ICD-10-CM | POA: Diagnosis present

## 2021-02-09 DIAGNOSIS — I890 Lymphedema, not elsewhere classified: Secondary | ICD-10-CM | POA: Diagnosis present

## 2021-02-09 DIAGNOSIS — M25611 Stiffness of right shoulder, not elsewhere classified: Secondary | ICD-10-CM | POA: Diagnosis present

## 2021-02-09 DIAGNOSIS — M25612 Stiffness of left shoulder, not elsewhere classified: Secondary | ICD-10-CM | POA: Diagnosis present

## 2021-02-09 DIAGNOSIS — M6281 Muscle weakness (generalized): Secondary | ICD-10-CM | POA: Diagnosis present

## 2021-02-09 DIAGNOSIS — Z483 Aftercare following surgery for neoplasm: Secondary | ICD-10-CM

## 2021-02-09 NOTE — Therapy (Signed)
[place], [place], [address] Phone: [phone]   Fax:  [phone]  Physical Therapy Re- Evaluation  Patient Details  Name: Cheryl Blankenship MRN: [id number] Date of Birth: 17-Sep-1951 Referring Provider (PT): Shona Simpson   Encounter Date: 02/09/2021    Past Medical History:  Diagnosis Date  . Anxiety   . Breast cancer ([place])   . CHF (congestive heart failure) ([place])   . Depression   . Diabetes mellitus without complication ([place])    type 2  . GERD (gastroesophageal reflux disease)   . History of kidney stones   . Hypertension   . PONV (postoperative nausea and vomiting)   . Stroke [facility])     Past Surgical History:  Procedure Laterality Date  . APPENDECTOMY    . CHOLECYSTECTOMY    . COLONOSCOPY    . ESOPHAGOGASTRODUODENOSCOPY (EGD) WITH PROPOFOL N/A 11/01/2020   Procedure: ESOPHAGOGASTRODUODENOSCOPY (EGD) WITH PROPOFOL;  Surgeon: Arta Silence, MD;  [facility];  Service: Endoscopy;  Laterality: N/A;  . EYE SURGERY     cataracts  . MASTECTOMY WITH AXILLARY LYMPH NODE DISSECTION Left 12/30/2020   Procedure: LEFT MASTECTOMY WITH TARGETED AXILLARY LYMPH NODE DISSECTION;  Surgeon: [name], MD;  Location: [place];  Service: General;  Laterality: Left;  [place] Kitchen MULTIPLE TOOTH EXTRACTIONS    . PORTACATH PLACEMENT N/A 08/04/2020   Procedure: INSERTION PORT-A-CATH WITH ULTRASOUND GUIDANCE;  Surgeon: [name], MD;  Location: ;  Service: General;  Laterality: N/A;  . STOMACH SURGERY      There were no vitals filed for this visit.    Subjective Assessment - 02/09/21 1305    Subjective Pt says she had surgery on her left side on she is having problems with her left arm.  She says she has hardness from her neck down under ther arm and under the the breast. She is having pain and swelling in her arm  she is preparing for radiation that will start 02/16/2021    Pertinent History  Patient was diagnosed on 05/20/2020 with left grade II invasive ductal carcinoma breast cancer. It measures 12 cm and is mostly located in the upper outer quadrant. It is ER/PR positive and HER2 negative with a Ki67 of 25%. She had chemotherpy tom 11/7//21-1/31/2022. She had numbness in her fingers and toes from that. She had a left mastectomy 12/30/2020 with 0/5 nodes removed.  She has a hx of a heart attack, stroke, and heart failure and diabetes. She is very lmied functionally and currently walks with a cane;    Patient Stated Goals to use her arm more    Currently in Pain? Yes    Pain Score 10-Worst pain ever    Pain Location Axilla    Pain Orientation Left    Pain Type Surgical pain    Pain Radiating Towards from axilla to the breast    Pain Onset More than a month ago    Pain Frequency Constant    Aggravating Factors  when she tries to move her left arm    Pain Relieving Factors medication so she can sleep              [facility] PT Assessment - 02/09/21 1311      Assessment   Medical Diagnosis left breast cancer    Referring Provider (PT) Shona Simpson    Onset Date/Surgical Date 05/20/20    Hand Dominance Right    Prior Therapy none  Precautions   Precautions Other (comment)    Precaution Comments at risk for lymphedema      Restrictions   Weight Bearing Restrictions No      Balance Screen   Has the patient fallen in the past 6 months No      [place] residence    Living Arrangements Children;Spouse/significant other    Available Help at Discharge Family    Type of [place] to enter    Entrance Stairs-Number of Steps 3    Entrance Stairs-Rails Right    [place] One level      Prior Function   Level of [place] device for independence;Needs assistance with ADLs;Needs assistance with homemaking   uses a cane or a walker   Vocation On disability    Leisure she reports she  tries to walk in her home      Cognition   Overall Cognitive Status Within Functional Limits for tasks assessed      Observation/Other Assessments   Observations Pt comes in wearing an ill fitting underwire bra that leave compression marks on her skin. Pt with visible congestion in left upper quadrant after mastectomy. Incision appears to be healing well.    Skin Integrity no open areas seen      Functional Tests   Functional tests --      Sit to Stand   Comments --      AROM   Overall AROM Comments Bilateral shoulder ROM is limited    Right Shoulder Extension 35 Degrees    Right Shoulder Flexion 130 Degrees    Right Shoulder ABduction 135 Degrees    Right Shoulder External Rotation 68 Degrees    Left Shoulder Extension 20 Degrees    Left Shoulder Flexion 65 Degrees    Left Shoulder ABduction 58 Degrees    Left Shoulder External Rotation 30 Degrees      Strength   Overall Strength Unable to assess;Due to pain    Strength Assessment Site Shoulder    Right/Left Shoulder Right;Left    Right Shoulder Flexion 3-/5    Right Shoulder ABduction 3-/5    Right Shoulder External Rotation 3-/5    Left Shoulder Flexion 2-/5    Left Shoulder ABduction 2-/5    Left Shoulder External Rotation 2-/5             LYMPHEDEMA/ONCOLOGY QUESTIONNAIRE - 02/09/21 0001      Type   Cancer Type left breast cancer      Surgeries   Mastectomy Date 12/30/20    Number Lymph Nodes Removed 5      Treatment   Past Chemotherapy Treatment Yes    Date 10/19/20    Active Radiation Treatment --   to start radiation soon     What other symptoms do you have   Are you Having Heaviness or Tightness Yes    Are you having Pain Yes    Are you having pitting edema No    Is it Hard or Difficult finding clothes that fit Yes    Do you have infections No    Is there Decreased scar mobility Yes    [place] Sign No      Lymphedema Assessments   Lymphedema Assessments Upper extremities      Left Upper  Extremity Lymphedema   10 cm Proximal to Olecranon Process 40 cm    Olecranon Process 30 cm  10 cm Proximal to Ulnar Styloid Process 26 cm    Just Proximal to Ulnar Styloid Process 19 cm    Across Hand at [employer] 21 cm    At [place] of 2nd Digit 7 cm                   Objective measurements completed on examination: See above findings.       [place] Adult PT Treatment/Exercise - 02/09/21 1311      Transfers   Transfers --    Comments --      Ambulation/Gait   Ambulation/Gait --    Ambulation/Gait Assistance --    Ambulation Distance (Feet) --    Assistive device --    Gait Pattern --      Posture/Postural Control   Posture/Postural Control --    Postural Limitations --      Exercises   Exercises Shoulder      Shoulder Exercises: Supine   External Rotation AAROM;AROM;Left;5 reps   with dowel   Flexion AAROM;Both;5 reps   with dowel   ABduction AAROM;Left;5 reps   with dowel   Other Supine Exercises AAROM with manual assist for shoulder elevation                  PT Education - 02/09/21 1720    Education Details where to get a compression bra. supine dowel exercise    Person(s) Educated Patient    Methods Explanation;Demonstration    Comprehension Verbalized understanding;Need further instruction            PT Short Term Goals - 12/07/20 1703      PT SHORT TERM GOAL #1   Title Pt will be able to sit to stand from a chair 1x without using hands    Baseline unable    Time 4    Period Weeks    Status New    Target Date 01/04/21      PT SHORT TERM GOAL #2   Title Pt will be able to ambulate with a step through gait pattern to improve walking speed    Baseline step to gait    Time 4    Period Weeks    Status New    Target Date 01/04/21      PT SHORT TERM GOAL #3   Title Pt will be indpendent in bed mobility to decrease reliance on caregivers    Baseline pt reports she needs assist for this    Time 4    Period Weeks    Status  New    Target Date 01/04/21      PT SHORT TERM GOAL #4   Title Pt will be independent in a home exercise program for LE strengthening to decrease fall risk    Time 4    Period Weeks    Status New    Target Date 01/04/21             PT Long Term Goals - 02/09/21 1731      PT LONG TERM GOAL #1   Title Pt will have 120 degrees of left shoulder abduction so that she can tolerate radiation .    Time 4    Period Months    Status New      PT LONG TERM GOAL #2   Title Pt will be independent a home exercise program for shoulder ROM and strength    Time 4    Period Weeks  Status New                  Plan - 02/09/21 1724    Clinical Impression Statement Pt comes back to PT after mastectomy with left upper quadrant congestion, pain, very limited ROM and much difficulty raising her left arm. She would benefit from a compression bra and infomration about how to get one and script given to her today. She was also instructed in dowel exericses but had difficutly doing them due to left shoulder pain and weakness as well as limited ROM.  Pt has previous mobilty issues that may slow her progress with PT, Did not address those today as post op care and preparing for radiation is the priority at this time,    Personal Factors and Comorbidities Comorbidity 3+    Comorbidities Hx heart attack, CVA, CHF,DM  multiple recent falls mastectomy    Examination-Activity Limitations Locomotion Level;Reach Overhead;Stand;Dressing;Hygiene/Grooming    Stability/Clinical Decision Making Evolving/Moderate complexity    Clinical Decision Making Moderate    Rehab Potential Fair    PT Frequency 2x / week    PT Duration 4 weeks    PT Treatment/Interventions ADLs/Self Care Home Management;Therapeutic exercise;Patient/family education;Gait training;Stair training;Functional mobility training;Therapeutic activities;Neuromuscular re-education;Balance training;Manual techniques;Manual lymph drainage;Passive  range of motion;Orthotic Fit/Training;Taping    PT Next Visit Plan concentrate on shoulder ROM to be able to tolerate radiation: pulleys, wall stretches UE ranger, A/PROM MLD to decrease congestion, did pt get bra?    Consulted and Agree with Plan of Care Patient           Patient will benefit from skilled therapeutic intervention in order to improve the following deficits and impairments:  Postural dysfunction,Decreased range of motion,Decreased knowledge of precautions,Impaired UE functional use,Pain,Decreased balance,Decreased activity tolerance,Increased edema,Abnormal gait,Decreased mobility,Decreased endurance,Difficulty walking,Increased muscle spasms,Decreased strength,Increased fascial restricitons,Obesity,Impaired flexibility,Impaired perceived functional ability  Visit Diagnosis: Aftercare following surgery for neoplasm  Stiffness of left shoulder, not elsewhere classified  Stiffness of right shoulder, not elsewhere classified  Lymphedema, not elsewhere classified  Muscle weakness (generalized)  Difficulty in walking, not elsewhere classified     Problem List Patient Active Problem List   Diagnosis Date Noted  . Invasive ductal carcinoma of breast, female, left ([place]) 12/30/2020  . Chest pain 10/30/2020  . Constipation 10/30/2020  . Type 2 diabetes mellitus without complication ([place]) 00/45/9977  . Exertional dyspnea 08/26/2020  . Essential hypertension 08/26/2020  . Acute diastolic CHF (congestive heart failure) ([place]) 08/26/2020  . Obesity, diabetes, and hypertension syndrome ([place]) 08/26/2020  . Malignant neoplasm of upper-outer quadrant of left breast in female, estrogen receptor positive ([place]) 07/20/2020   Donato Heinz. Owens Shark PT  [place] Levo 02/09/2021, 5:38 PM  [place], [place], [address] Phone: [phone]   Fax:  [phone]  Name: Cheryl Blankenship MRN:  [id number] Date of Birth: 1951/04/19

## 2021-02-09 NOTE — Patient Instructions (Signed)
First of all, check with your insurance company to see if provider is in [place] (for wigs and compression sleeves / gloves/gauntlets )  [place], [place] [address] [phone]  Will file some insurances --- call for appointment   Second to [facility] (for mastectomy prosthetics and garments) [place], [place] [address] [phone] Will file some insurances --- call for appointment  [facility]  [address]  [place], [place] [address] [phone] Lower extremity garments  [facility] [place], [place]  [address] [place] [facility] (for compression garments, especilly for lower extremities) [address] [place],   [address] [phone] Call for appointment    Jerrol Banana ,certified fitter [place]  [phone]  [employer] (for mastectomy supplies and garments) [place],  [address] [phone]  Other Resources: National Lymphedema Network:  www.lymphnet.org www.Klosetraining.com for patient articles and self manual lymph drainage information www.lymphedemablog.com has informative articles.  DishTag.es.com www.lymphedemaproducts.com Www.brightlifedirect.com     SHOULDER: Flexion - Supine (Cane)        Cancer Rehab [phone]    Hold cane in both hands. Raise arms up overhead. Do not allow back to arch. Hold _5__ seconds. Do __5-10__ times; __1-2__ times a day.   SELF ASSISTED WITH OBJECT: Shoulder Abduction / Adduction - Supine    Hold cane with both hands. Move both arms from side to side, keep elbows straight.  Hold when stretch felt for __5__ seconds. Repeat __5-10__ times; __1-2__ times a day. Once this becomes easier progress to third picture bringing affected arm towards ear by staying out to side. Same hold for _5_seconds. Repeat   _5-10_ times, _1-2_ times/day.  Shoulder Blade Stretch    Clasp fingers behind head with elbows touching in front of face. Pull elbows back while pressing shoulder blades together. Relax and hold as tolerated, can place pillow under elbow here for comfort as needed and to allow for prolonged stretch.  Repeat __5__ times. Do __1-2__ sessions per day.     SHOULDER: External Rotation - Supine (Cane)    Hold cane with both hands. Rotate arm away from body. Keep elbow on floor and next to body. _5-10__ reps per set, hold 5 seconds, _1-2__ sets per day. Add towel to keep elbow at side.  Copyright  VHI. All rights reserved.

## 2021-02-10 ENCOUNTER — Ambulatory Visit: Payer: Medicare HMO

## 2021-02-11 ENCOUNTER — Ambulatory Visit (HOSPITAL_COMMUNITY): Admission: RE | Admit: 2021-02-11 | Payer: Medicare HMO | Source: Ambulatory Visit

## 2021-02-11 ENCOUNTER — Other Ambulatory Visit: Payer: Medicare HMO

## 2021-02-11 ENCOUNTER — Other Ambulatory Visit: Payer: Self-pay

## 2021-02-11 ENCOUNTER — Inpatient Hospital Stay: Payer: Medicare HMO

## 2021-02-11 ENCOUNTER — Ambulatory Visit: Payer: Medicare HMO | Admitting: Nurse Practitioner

## 2021-02-11 VITALS — BP 136/67 | HR 67 | Temp 98.4°F | Resp 18 | Wt 207.8 lb

## 2021-02-11 DIAGNOSIS — C50412 Malignant neoplasm of upper-outer quadrant of left female breast: Secondary | ICD-10-CM

## 2021-02-11 DIAGNOSIS — Z5112 Encounter for antineoplastic immunotherapy: Secondary | ICD-10-CM | POA: Diagnosis not present

## 2021-02-11 DIAGNOSIS — Z170 Estrogen receptor positive status [ER+]: Secondary | ICD-10-CM

## 2021-02-11 MED ORDER — DIPHENHYDRAMINE HCL 25 MG PO CAPS
50.0000 mg | ORAL_CAPSULE | Freq: Once | ORAL | Status: AC
  Administered 2021-02-11: 50 mg via ORAL

## 2021-02-11 MED ORDER — DIPHENHYDRAMINE HCL 25 MG PO CAPS
ORAL_CAPSULE | ORAL | Status: AC
  Filled 2021-02-11: qty 2

## 2021-02-11 MED ORDER — ACETAMINOPHEN 325 MG PO TABS
ORAL_TABLET | ORAL | Status: AC
  Filled 2021-02-11: qty 2

## 2021-02-11 MED ORDER — SODIUM CHLORIDE 0.9 % IV SOLN
Freq: Once | INTRAVENOUS | Status: AC
  Filled 2021-02-11: qty 250

## 2021-02-11 MED ORDER — TRASTUZUMAB-ANNS CHEMO 150 MG IV SOLR
6.0000 mg/kg | Freq: Once | INTRAVENOUS | Status: AC
  Administered 2021-02-11: 567 mg via INTRAVENOUS
  Filled 2021-02-11: qty 27

## 2021-02-11 MED ORDER — ACETAMINOPHEN 325 MG PO TABS
650.0000 mg | ORAL_TABLET | Freq: Once | ORAL | Status: AC
  Administered 2021-02-11: 650 mg via ORAL

## 2021-02-11 MED ORDER — SODIUM CHLORIDE 0.9% FLUSH
10.0000 mL | INTRAVENOUS | Status: DC | PRN
  Administered 2021-02-11: 10 mL
  Filled 2021-02-11: qty 10

## 2021-02-11 MED ORDER — HEPARIN SOD (PORK) LOCK FLUSH 100 UNIT/ML IV SOLN
500.0000 [IU] | Freq: Once | INTRAVENOUS | Status: AC | PRN
  Administered 2021-02-11: 500 [IU]
  Filled 2021-02-11: qty 5

## 2021-02-12 ENCOUNTER — Ambulatory Visit (HOSPITAL_COMMUNITY): Payer: Medicare HMO

## 2021-02-12 ENCOUNTER — Encounter: Payer: Self-pay | Admitting: *Deleted

## 2021-02-14 DIAGNOSIS — Z510 Encounter for antineoplastic radiation therapy: Secondary | ICD-10-CM | POA: Diagnosis not present

## 2021-02-16 ENCOUNTER — Ambulatory Visit: Admission: RE | Admit: 2021-02-16 | Payer: Medicare HMO | Source: Ambulatory Visit | Admitting: Radiation Oncology

## 2021-02-16 ENCOUNTER — Other Ambulatory Visit: Payer: Self-pay

## 2021-02-16 DIAGNOSIS — Z510 Encounter for antineoplastic radiation therapy: Secondary | ICD-10-CM | POA: Diagnosis not present

## 2021-02-17 ENCOUNTER — Ambulatory Visit
Admission: RE | Admit: 2021-02-17 | Discharge: 2021-02-17 | Disposition: A | Discharge status: Home / Self Care | Payer: Medicare HMO | Source: Ambulatory Visit | Attending: Radiation Oncology | Admitting: Radiation Oncology

## 2021-02-17 DIAGNOSIS — C50912 Malignant neoplasm of unspecified site of left female breast: Secondary | ICD-10-CM | POA: Diagnosis present

## 2021-02-18 ENCOUNTER — Ambulatory Visit
Admission: RE | Admit: 2021-02-18 | Discharge: 2021-02-18 | Disposition: A | Discharge status: Home / Self Care | Payer: Medicare HMO | Source: Ambulatory Visit | Attending: Radiation Oncology | Admitting: Radiation Oncology

## 2021-02-18 ENCOUNTER — Other Ambulatory Visit: Payer: Self-pay

## 2021-02-18 DIAGNOSIS — C50912 Malignant neoplasm of unspecified site of left female breast: Secondary | ICD-10-CM | POA: Diagnosis not present

## 2021-02-18 NOTE — Progress Notes (Signed)
Pt here for patient teaching.  Pt given Radiation and You booklet, skin care instructions, [place]ra deodorant and Radiaplex gel.  Reviewed areas of pertinence such as fatigue, hair loss, skin changes, breast tenderness and breast swelling . Pt able to give teach back of to pat skin and use unscented/gentle soap,apply Radiaplex bid, avoid applying anything to skin within 4 hours of treatment, avoid wearing an under wire bra and to use an electric razor if they must shave. Pt verbalizes understanding of information given and will contact nursing with any questions or concerns.     Http://rtanswers.org/treatmentinformation/whattoexpect/index

## 2021-02-19 ENCOUNTER — Ambulatory Visit
Admission: RE | Admit: 2021-02-19 | Discharge: 2021-02-19 | Disposition: A | Discharge status: Home / Self Care | Payer: Medicare HMO | Source: Ambulatory Visit | Attending: Radiation Oncology | Admitting: Radiation Oncology

## 2021-02-19 DIAGNOSIS — C50912 Malignant neoplasm of unspecified site of left female breast: Secondary | ICD-10-CM | POA: Diagnosis not present

## 2021-02-19 MED ORDER — ALRA NON-METALLIC DEODORANT (RAD-ONC)
1.0000 | Freq: Once | TOPICAL | Status: AC
Start: 2021-02-19 — End: 2021-02-19
  Administered 2021-02-19: 1 via TOPICAL

## 2021-02-19 MED ORDER — RADIAPLEXRX EX GEL: Freq: Once | CUTANEOUS | Status: AC

## 2021-02-22 ENCOUNTER — Ambulatory Visit: Payer: Medicare HMO

## 2021-02-23 ENCOUNTER — Ambulatory Visit
Admission: RE | Admit: 2021-02-23 | Discharge: 2021-02-23 | Disposition: A | Discharge status: Home / Self Care | Payer: Medicare HMO | Source: Ambulatory Visit | Attending: Radiation Oncology | Admitting: Radiation Oncology

## 2021-02-23 ENCOUNTER — Other Ambulatory Visit: Payer: Self-pay

## 2021-02-23 DIAGNOSIS — C50912 Malignant neoplasm of unspecified site of left female breast: Secondary | ICD-10-CM | POA: Diagnosis not present

## 2021-02-24 ENCOUNTER — Ambulatory Visit: Payer: Medicare HMO

## 2021-02-24 ENCOUNTER — Ambulatory Visit: Payer: Medicare HMO | Attending: Hematology | Admitting: Physical Therapy

## 2021-02-24 ENCOUNTER — Encounter: Payer: Self-pay | Admitting: Physical Therapy

## 2021-02-24 DIAGNOSIS — R296 Repeated falls: Secondary | ICD-10-CM

## 2021-02-24 DIAGNOSIS — M25611 Stiffness of right shoulder, not elsewhere classified: Secondary | ICD-10-CM | POA: Diagnosis present

## 2021-02-24 DIAGNOSIS — C50412 Malignant neoplasm of upper-outer quadrant of left female breast: Secondary | ICD-10-CM | POA: Insufficient documentation

## 2021-02-24 DIAGNOSIS — M6281 Muscle weakness (generalized): Secondary | ICD-10-CM | POA: Diagnosis present

## 2021-02-24 DIAGNOSIS — Z483 Aftercare following surgery for neoplasm: Secondary | ICD-10-CM | POA: Insufficient documentation

## 2021-02-24 DIAGNOSIS — R293 Abnormal posture: Secondary | ICD-10-CM | POA: Diagnosis present

## 2021-02-24 DIAGNOSIS — M25612 Stiffness of left shoulder, not elsewhere classified: Secondary | ICD-10-CM | POA: Diagnosis present

## 2021-02-24 DIAGNOSIS — Z170 Estrogen receptor positive status [ER+]: Secondary | ICD-10-CM | POA: Insufficient documentation

## 2021-02-24 DIAGNOSIS — R262 Difficulty in walking, not elsewhere classified: Secondary | ICD-10-CM | POA: Diagnosis present

## 2021-02-24 DIAGNOSIS — I890 Lymphedema, not elsewhere classified: Secondary | ICD-10-CM | POA: Insufficient documentation

## 2021-02-24 NOTE — Therapy (Signed)
[place], [place], [address] Phone: [phone]   Fax:  [phone]  Physical Therapy Treatment  Patient Details  Name: Cheryl Blankenship MRN: [id number] Date of Birth: 1951-04-23 Referring Provider (PT): Shona Simpson   Encounter Date: 02/24/2021   PT End of Session - 02/24/21 1615    Visit Number 2    Number of Visits 18    Date for PT Re-Evaluation 03/12/21    PT Start Time 1500    PT Stop Time 1555    PT Time Calculation (min) 55 min    Activity Tolerance Patient tolerated treatment well    Behavior During Therapy [facility] for tasks assessed/performed           Past Medical History:  Diagnosis Date  . Anxiety   . Breast cancer ([place])   . CHF (congestive heart failure) ([place])   . Depression   . Diabetes mellitus without complication ([place])    type 2  . GERD (gastroesophageal reflux disease)   . History of kidney stones   . Hypertension   . PONV (postoperative nausea and vomiting)   . Stroke [facility])     Past Surgical History:  Procedure Laterality Date  . APPENDECTOMY    . CHOLECYSTECTOMY    . COLONOSCOPY    . ESOPHAGOGASTRODUODENOSCOPY (EGD) WITH PROPOFOL N/A 11/01/2020   Procedure: ESOPHAGOGASTRODUODENOSCOPY (EGD) WITH PROPOFOL;  Surgeon: Arta Silence, MD;  [facility];  Service: Endoscopy;  Laterality: N/A;  . EYE SURGERY     cataracts  . MASTECTOMY WITH AXILLARY LYMPH NODE DISSECTION Left 12/30/2020   Procedure: LEFT MASTECTOMY WITH TARGETED AXILLARY LYMPH NODE DISSECTION;  Surgeon: [name], MD;  Location: [place];  Service: General;  Laterality: Left;  Marland Kitchen MULTIPLE TOOTH EXTRACTIONS    . PORTACATH PLACEMENT N/A 08/04/2020   Procedure: INSERTION PORT-A-CATH WITH ULTRASOUND GUIDANCE;  Surgeon: [name], MD;  Location: [facility];  Service: General;  Laterality: N/A;  . STOMACH SURGERY      There were no vitals filed for this visit.   Subjective Assessment - 02/24/21  1506    Subjective She says the pain and numbness in her feet is worse than it was before radiation  and she has to take the nerve pills more. She is   She says that she cannot be as active as she was.  She is nauseous and has no appetite. She feels swollen in her chest , adomen and left arm.    Pertinent History Patient was diagnosed on 05/20/2020 with left grade II invasive ductal carcinoma breast cancer. It measures 12 cm and is mostly located in the upper outer quadrant. It is ER/PR positive and HER2 negative with a Ki67 of 25%. She had chemotherpy tom 11/7//21-1/31/2022. She had numbness in her fingers and toes from that. She had a left mastectomy 12/30/2020 with 0/5 nodes removed.  She has a hx of a heart attack, stroke, and heart failure and diabetes. She is very limited functionally and currently walks with a cane;    Patient Stated Goals to use her arm more    Currently in Pain? Yes    Pain Score --   did not rate, but pt moans, moves very slowly and winces with movement   Pain Orientation --   pain all over, but more on the left side   Pain Onset In the past 7 days   gotten much worse since radiation started   Pain Frequency Constant  Aggravating Factors  when she tries to move    Pain Relieving Factors taking more medication    Multiple Pain Sites Yes                 LYMPHEDEMA/ONCOLOGY QUESTIONNAIRE - 02/24/21 0001      Left Upper Extremity Lymphedema   10 cm Proximal to Olecranon Process 41 cm    Olecranon Process 30 cm    15 cm Proximal to Ulnar Styloid Process 30 cm    10 cm Proximal to Ulnar Styloid Process 27 cm    Just Proximal to Ulnar Styloid Process 18.6 cm    Across Hand at [employer] 21 cm    At [place] of 2nd Digit 7 cm                      [facility] Adult PT Treatment/Exercise - 02/24/21 0001      Exercises   Exercises Shoulder      Shoulder Exercises: Pulleys   Flexion 2 minutes    Flexion Limitations cues to use right arm to pull down to  stretch left arm. Pt was able to stretch further as she continued the movement.      Shoulder Exercises: ROM/Strengthening   Wall Wash with folded towel on wall in standing. Pt had difficulty raising left arm      Manual Therapy   Manual Therapy Edema management;Manual Lymphatic Drainage (MLD);Passive ROM    Edema Management remesured left arm.    Manual Lymphatic Drainage (MLD) diaphragmatic breaths, short neck around radiation field,  left shoulder and upper arm to hand and return. then to sidelying for posterior anastamosis.    Passive ROM to left shoulder                    PT Short Term Goals - 12/07/20 1703      PT SHORT TERM GOAL #1   Title Pt will be able to sit to stand from a chair 1x without using hands    Baseline unable    Time 4    Period Weeks    Status New    Target Date 01/04/21      PT SHORT TERM GOAL #2   Title Pt will be able to ambulate with a step through gait pattern to improve walking speed    Baseline step to gait    Time 4    Period Weeks    Status New    Target Date 01/04/21      PT SHORT TERM GOAL #3   Title Pt will be indpendent in bed mobility to decrease reliance on caregivers    Baseline pt reports she needs assist for this    Time 4    Period Weeks    Status New    Target Date 01/04/21      PT SHORT TERM GOAL #4   Title Pt will be independent in a home exercise program for LE strengthening to decrease fall risk    Time 4    Period Weeks    Status New    Target Date 01/04/21             PT Long Term Goals - 02/09/21 1731      PT LONG TERM GOAL #1   Title Pt will have 120 degrees of left shoulder abduction so that she can tolerate radiation .    Time 4    Period Months  Status New      PT LONG TERM GOAL #2   Title Pt will be independent a home exercise program for shoulder ROM and strength    Time 4    Period Weeks    Status New                 Plan - 02/24/21 1615    Clinical Impression Statement Pt  reports she is not doing well with radiation and she is very upset but her increased pain and decreased mobility. I sent an inbasket to her nurse navigaters to alert them and call to talk to patient.  She has congestion in left upper quadrant and abdomen that was not relieved with MLD. She was able to perform pulley exercise with some improvement in stretching and had difficulty with standing wall wash exericse. Pt motivated to continue PT    Personal Factors and Comorbidities Comorbidity 3+    Comorbidities Hx heart attack, CVA, CHF,DM  multiple recent falls mastectomy    Examination-Activity Limitations Locomotion Level;Reach Overhead;Stand;Dressing;Hygiene/Grooming    Stability/Clinical Decision Making Evolving/Moderate complexity    Rehab Potential Fair    PT Frequency 2x / week    PT Duration 4 weeks    PT Treatment/Interventions ADLs/Self Care Home Management;Therapeutic exercise;Patient/family education;Gait training;Stair training;Functional mobility training;Therapeutic activities;Neuromuscular re-education;Balance training;Manual techniques;Manual lymph drainage;Passive range of motion;Orthotic Fit/Training;Taping    PT Next Visit Plan MLD in sitting to decongest outside of radiation field concentrate on shoulder ROM to be able to tolerate radiation: pulleys, wall stretches UE ranger, A/PROM MLD to decrease congestion, did pt get bra?    PT Home Exercise Plan Post op shoulder ROM HEP    Consulted and Agree with Plan of Care Patient           Patient will benefit from skilled therapeutic intervention in order to improve the following deficits and impairments:  Postural dysfunction,Decreased range of motion,Decreased knowledge of precautions,Impaired UE functional use,Pain,Decreased balance,Decreased activity tolerance,Increased edema,Abnormal gait,Decreased mobility,Decreased endurance,Difficulty walking,Increased muscle spasms,Decreased strength,Increased fascial  restricitons,Obesity,Impaired flexibility,Impaired perceived functional ability  Visit Diagnosis: Aftercare following surgery for neoplasm  Stiffness of left shoulder, not elsewhere classified  Stiffness of right shoulder, not elsewhere classified  Lymphedema, not elsewhere classified  Muscle weakness (generalized)  Difficulty in walking, not elsewhere classified  Repeated falls  Abnormal posture     Problem List Patient Active Problem List   Diagnosis Date Noted  . Invasive ductal carcinoma of breast, female, left ([place]) 12/30/2020  . Chest pain 10/30/2020  . Constipation 10/30/2020  . Type 2 diabetes mellitus without complication ([place]) 87/68/1157  . Exertional dyspnea 08/26/2020  . Essential hypertension 08/26/2020  . Acute diastolic CHF (congestive heart failure) ([place]) 08/26/2020  . Obesity, diabetes, and hypertension syndrome ([place]) 08/26/2020  . Malignant neoplasm of upper-outer quadrant of left breast in female, estrogen receptor positive ([place]) 07/20/2020   Donato Heinz. Owens Shark PT  [place] Levo 02/24/2021, 4:20 PM  [place], [place], [address] Phone: [phone]   Fax:  [phone]  Name: Cheryl Blankenship MRN: [id number] Date of Birth: [birth year]

## 2021-02-25 ENCOUNTER — Ambulatory Visit
Admission: RE | Admit: 2021-02-25 | Discharge: 2021-02-25 | Disposition: A | Discharge status: Home / Self Care | Payer: Medicare HMO | Source: Ambulatory Visit | Attending: Radiation Oncology | Admitting: Radiation Oncology

## 2021-02-25 ENCOUNTER — Telehealth: Payer: Self-pay | Admitting: Radiation Oncology

## 2021-02-25 ENCOUNTER — Other Ambulatory Visit: Payer: Self-pay

## 2021-02-25 DIAGNOSIS — C50912 Malignant neoplasm of unspecified site of left female breast: Secondary | ICD-10-CM | POA: Diagnosis not present

## 2021-02-25 NOTE — Telephone Encounter (Signed)
I attempted to call the patient about some of her concerns she shared with PT about her treatment. I will try to see her today at the time of her treatment.

## 2021-02-26 ENCOUNTER — Other Ambulatory Visit: Payer: Self-pay | Admitting: Radiation Oncology

## 2021-02-26 ENCOUNTER — Ambulatory Visit
Admission: RE | Admit: 2021-02-26 | Discharge: 2021-02-26 | Disposition: A | Discharge status: Home / Self Care | Payer: Medicare HMO | Source: Ambulatory Visit | Attending: Radiation Oncology | Admitting: Radiation Oncology

## 2021-02-26 DIAGNOSIS — C50912 Malignant neoplasm of unspecified site of left female breast: Secondary | ICD-10-CM | POA: Diagnosis not present

## 2021-02-26 MED ORDER — OXYCODONE-ACETAMINOPHEN 5-325 MG PO TABS: 1.0000 | ORAL_TABLET | Freq: Three times a day (TID) | ORAL | 0 refills | Status: DC | PRN

## 2021-02-26 MED ORDER — ALPRAZOLAM 0.25 MG PO TABS: 0.2500 mg | ORAL_TABLET | Freq: Three times a day (TID) | ORAL | 0 refills | Status: AC | PRN

## 2021-03-01 ENCOUNTER — Other Ambulatory Visit: Payer: Self-pay

## 2021-03-01 ENCOUNTER — Ambulatory Visit
Admission: RE | Admit: 2021-03-01 | Discharge: 2021-03-01 | Disposition: A | Discharge status: Home / Self Care | Payer: Medicare HMO | Source: Ambulatory Visit | Attending: Radiation Oncology | Admitting: Radiation Oncology

## 2021-03-01 DIAGNOSIS — C50912 Malignant neoplasm of unspecified site of left female breast: Secondary | ICD-10-CM | POA: Diagnosis not present

## 2021-03-02 ENCOUNTER — Ambulatory Visit
Admission: RE | Admit: 2021-03-02 | Discharge: 2021-03-02 | Disposition: A | Discharge status: Home / Self Care | Payer: Medicare HMO | Source: Ambulatory Visit | Attending: Radiation Oncology | Admitting: Radiation Oncology

## 2021-03-02 ENCOUNTER — Ambulatory Visit: Payer: Medicare HMO | Admitting: Physical Therapy

## 2021-03-02 ENCOUNTER — Other Ambulatory Visit: Payer: Self-pay

## 2021-03-02 ENCOUNTER — Encounter: Payer: Self-pay | Admitting: Physical Therapy

## 2021-03-02 DIAGNOSIS — Z483 Aftercare following surgery for neoplasm: Secondary | ICD-10-CM

## 2021-03-02 DIAGNOSIS — R293 Abnormal posture: Secondary | ICD-10-CM

## 2021-03-02 DIAGNOSIS — M25612 Stiffness of left shoulder, not elsewhere classified: Secondary | ICD-10-CM

## 2021-03-02 DIAGNOSIS — M25611 Stiffness of right shoulder, not elsewhere classified: Secondary | ICD-10-CM

## 2021-03-02 DIAGNOSIS — C50912 Malignant neoplasm of unspecified site of left female breast: Secondary | ICD-10-CM | POA: Diagnosis not present

## 2021-03-02 DIAGNOSIS — I890 Lymphedema, not elsewhere classified: Secondary | ICD-10-CM

## 2021-03-02 DIAGNOSIS — R262 Difficulty in walking, not elsewhere classified: Secondary | ICD-10-CM

## 2021-03-02 DIAGNOSIS — M6281 Muscle weakness (generalized): Secondary | ICD-10-CM

## 2021-03-02 NOTE — Therapy (Signed)
[place], [place], [address] Phone: [phone]   Fax:  [phone]  Physical Therapy Treatment  Patient Details  Name: Cheryl Blankenship MRN: [id number] Date of Birth: Jun 20, 1951 Referring Provider (PT): Shona Simpson   Encounter Date: 03/02/2021   PT End of Session - 03/02/21 1717     Visit Number 3    Number of Visits 18    Date for PT Re-Evaluation 03/12/21    PT Start Time 1455    PT Stop Time 1555    PT Time Calculation (min) 60 min    Activity Tolerance Patient tolerated treatment well    Behavior During Therapy [facility] for tasks assessed/performed             Past Medical History:  Diagnosis Date   Anxiety    Breast cancer ([place])    CHF (congestive heart failure) ([place])    Depression    Diabetes mellitus without complication ([place])    type 2   GERD (gastroesophageal reflux disease)    History of kidney stones    Hypertension    PONV (postoperative nausea and vomiting)    Stroke [facility])     Past Surgical History:  Procedure Laterality Date   APPENDECTOMY     CHOLECYSTECTOMY     COLONOSCOPY     ESOPHAGOGASTRODUODENOSCOPY (EGD) WITH PROPOFOL N/A 11/01/2020   Procedure: ESOPHAGOGASTRODUODENOSCOPY (EGD) WITH PROPOFOL;  Surgeon: Arta Silence, MD;  [facility];  Service: Endoscopy;  Laterality: N/A;   EYE SURGERY     cataracts   MASTECTOMY WITH AXILLARY LYMPH NODE DISSECTION Left 12/30/2020   Procedure: LEFT MASTECTOMY WITH TARGETED AXILLARY LYMPH NODE DISSECTION;  Surgeon: [name], MD;  Location: [place];  Service: General;  Laterality: Left;   MULTIPLE TOOTH EXTRACTIONS     PORTACATH PLACEMENT N/A 08/04/2020   Procedure: INSERTION PORT-A-CATH WITH ULTRASOUND GUIDANCE;  Surgeon: [name], MD;  Location: [place];  Service: General;  Laterality: N/A;   STOMACH SURGERY      There were no vitals filed for this visit.   Subjective Assessment - 03/02/21 1458      Subjective Pt states that she got some new medicines that she hopes will help, but she still is not feeling well.  She is going to see the cardiologist on Friday    Pertinent History Patient was diagnosed on 05/20/2020 with left grade II invasive ductal carcinoma breast cancer. It measures 12 cm and is mostly located in the upper outer quadrant. It is ER/PR positive and HER2 negative with a Ki67 of 25%. She had chemotherpy tom 11/7//21-1/31/2022. She had numbness in her fingers and toes from that. She had a left mastectomy 12/30/2020 with 0/5 nodes removed.  She has a hx of a heart attack, stroke, and heart failure and diabetes. She is very limited functionally and currently walks with a cane;    Patient Stated Goals to use her arm more    Currently in Pain? Yes    Pain Score 8     Pain Location Leg   feet and toes from neuropathy   Pain Orientation Right;Left    Pain Descriptors / Indicators Numbness    Pain Type Neuropathic pain    Pain Radiating Towards legs feet and toes                               [facility] Adult PT Treatment/Exercise - 03/02/21 0001  Ambulation/Gait   Ambulation/Gait Yes    Gait Comments 100 feet with rollator with cues to increase posture      Exercises   Exercises Shoulder;Ankle;Knee/Hip;Other Exercises    Other Exercises  neck and thoracic ROM in sitting. Pt has diffiuclty with shoulder rolls and scapular ROM   thoughout session, cues to for pt to activate elbow extension and hand activation so pt has a more "purposeful" upper extremity activation     Knee/Hip Exercises: Standing   Other Standing Knee Exercises 5 reps of sit to stand from high mat. Pt with difficulty controlling descent, so did mini hip hinge focusing on getting hips behind her and then extend to glute set.  Pt better able to control descent after that practice    Other Standing Knee Exercises with shift to each leg with stance lateral at hip width apart and also in staggered  stance      Knee/Hip Exercises: Seated   Long Arc Quad AROM;Both;5 reps      Knee/Hip Exercises: Supine   Terminal Knee Extension AROM;Both;3 sets;5 reps   bolster under knees     Shoulder Exercises: Supine   External Rotation AROM;Left;5 reps   gentle manual assist   Flexion AAROM;Both;5 reps   with dowel   Diagonals AAROM;Left;5 reps   with manual assist in midrange with cues to keep elbow straight and use hand activation   Other Supine Exercises manual assist for shoulder ROM in painfree range in multiple planes      Shoulder Exercises: Seated   Elevation AROM;Right;10 reps   activation of nonaffected arm to get more active motion of body     Manual Therapy   Manual Therapy Soft tissue mobilization;Myofascial release    Manual therapy comments Pt has very tight pec major and posterior shoulder muscles and attemtped brift myofascial release and soft tissue work, but she did not tolerate it as it caused pain                      PT Short Term Goals - 12/07/20 1703       PT SHORT TERM GOAL #1   Title Pt will be able to sit to stand from a chair 1x without using hands    Baseline unable    Time 4    Period Weeks    Status New    Target Date 01/04/21      PT SHORT TERM GOAL #2   Title Pt will be able to ambulate with a step through gait pattern to improve walking speed    Baseline step to gait    Time 4    Period Weeks    Status New    Target Date 01/04/21      PT SHORT TERM GOAL #3   Title Pt will be indpendent in bed mobility to decrease reliance on caregivers    Baseline pt reports she needs assist for this    Time 4    Period Weeks    Status New    Target Date 01/04/21      PT SHORT TERM GOAL #4   Title Pt will be independent in a home exercise program for LE strengthening to decrease fall risk    Time 4    Period Weeks    Status New    Target Date 01/04/21               PT Long Term Goals - 02/09/21 1731  PT LONG TERM GOAL #1    Title Pt will have 120 degrees of left shoulder abduction so that she can tolerate radiation .    Time 4    Period Months    Status New      PT LONG TERM GOAL #2   Title Pt will be independent a home exercise program for shoulder ROM and strength    Time 4    Period Weeks    Status New                   Plan - 03/02/21 1611     Clinical Impression Statement Pt reports she is still having difficulty with moving around and she gets "stiff". However, she appeared to be less uncomfotable than last visit  Pt needed consistent cues to activate elbow wrist and finger extension with exercises.  She continues to have complaints of pain so worked on active movement of her right ( unaffected ) arm that she can move more easily.  Also worked on [employer] with sit to stand and weight shift as well as walking to increase general strength Pt does not tolerate much manual work on the tight muscles of her left shoulder as she says it causes her pain    Personal Factors and Comorbidities Comorbidity 3+    Comorbidities Hx heart attack, CVA, CHF,DM  multiple recent falls mastectomy    Examination-Activity Limitations Locomotion Level;Reach Overhead;Stand;Dressing;Hygiene/Grooming    Rehab Potential Fair    PT Frequency 2x / week    PT Treatment/Interventions ADLs/Self Care Home Management;Therapeutic exercise;Patient/family education;Gait training;Stair training;Functional mobility training;Therapeutic activities;Neuromuscular re-education;Balance training;Manual techniques;Manual lymph drainage;Passive range of motion;Orthotic Fit/Training;Taping    PT Next Visit Plan A/PROM MLD to decrease congestion and decrease pain as able AROM of neck thoracic area, shoulders.  sit to stand from a high mat, weight shift, posture Try UE ranger for active left shoulder work    [employer] and Agree with Plan of Care Patient             Patient will benefit from skilled therapeutic intervention in order  to improve the following deficits and impairments:  Postural dysfunction, Decreased range of motion, Decreased knowledge of precautions, Impaired UE functional use, Pain, Decreased balance, Decreased activity tolerance, Increased edema, Abnormal gait, Decreased mobility, Decreased endurance, Difficulty walking, Increased muscle spasms, Decreased strength, Increased fascial restricitons, Obesity, Impaired flexibility, Impaired perceived functional ability  Visit Diagnosis: Aftercare following surgery for neoplasm  Stiffness of left shoulder, not elsewhere classified  Stiffness of right shoulder, not elsewhere classified  Lymphedema, not elsewhere classified  Muscle weakness (generalized)  Difficulty in walking, not elsewhere classified  Abnormal posture     Problem List Patient Active Problem List   Diagnosis Date Noted   Invasive ductal carcinoma of breast, female, left ([place]) 12/30/2020   Chest pain 10/30/2020   Constipation 10/30/2020   Type 2 diabetes mellitus without complication ([place]) 65/99/3570   Exertional dyspnea 08/26/2020   Essential hypertension 17/79/3903   Acute diastolic CHF (congestive heart failure) ([place]) 08/26/2020   Obesity, diabetes, and hypertension syndrome ([place]) 08/26/2020   Malignant neoplasm of upper-outer quadrant of left breast in female, estrogen receptor positive ([place]) 07/20/2020   Donato Heinz. Owens Shark PT  Norwood Levo 03/02/2021, 5:24 PM  [place], [place], [address] Phone: [phone]   Fax:  [phone]  Name: Cheryl Blankenship MRN: [id number] Date of Birth: [birth year]

## 2021-03-03 ENCOUNTER — Ambulatory Visit
Admission: RE | Admit: 2021-03-03 | Discharge: 2021-03-03 | Disposition: A | Discharge status: Home / Self Care | Payer: Medicare HMO | Source: Ambulatory Visit | Attending: Radiation Oncology | Admitting: Radiation Oncology

## 2021-03-03 ENCOUNTER — Other Ambulatory Visit: Payer: Self-pay

## 2021-03-03 DIAGNOSIS — C50912 Malignant neoplasm of unspecified site of left female breast: Secondary | ICD-10-CM | POA: Diagnosis not present

## 2021-03-04 ENCOUNTER — Encounter: Payer: Self-pay | Admitting: Physical Therapy

## 2021-03-04 ENCOUNTER — Telehealth: Payer: Self-pay

## 2021-03-04 ENCOUNTER — Ambulatory Visit: Payer: Medicare HMO | Admitting: Hematology

## 2021-03-04 ENCOUNTER — Ambulatory Visit
Admission: RE | Admit: 2021-03-04 | Discharge: 2021-03-04 | Disposition: A | Discharge status: Home / Self Care | Payer: Medicare HMO | Source: Ambulatory Visit | Attending: Radiation Oncology | Admitting: Radiation Oncology

## 2021-03-04 ENCOUNTER — Inpatient Hospital Stay: Payer: Medicare HMO | Attending: Nurse Practitioner

## 2021-03-04 ENCOUNTER — Other Ambulatory Visit: Payer: Medicare HMO

## 2021-03-04 VITALS — BP 164/89 | HR 63 | Temp 98.2°F | Resp 18 | Wt 212.0 lb

## 2021-03-04 DIAGNOSIS — Z170 Estrogen receptor positive status [ER+]: Secondary | ICD-10-CM | POA: Insufficient documentation

## 2021-03-04 DIAGNOSIS — C50912 Malignant neoplasm of unspecified site of left female breast: Secondary | ICD-10-CM | POA: Diagnosis not present

## 2021-03-04 DIAGNOSIS — Z5112 Encounter for antineoplastic immunotherapy: Secondary | ICD-10-CM | POA: Insufficient documentation

## 2021-03-04 DIAGNOSIS — C50412 Malignant neoplasm of upper-outer quadrant of left female breast: Secondary | ICD-10-CM | POA: Diagnosis present

## 2021-03-04 MED ORDER — ACETAMINOPHEN 325 MG PO TABS
650.0000 mg | ORAL_TABLET | Freq: Once | ORAL | Status: AC
  Administered 2021-03-04: 650 mg via ORAL

## 2021-03-04 MED ORDER — HEPARIN SOD (PORK) LOCK FLUSH 100 UNIT/ML IV SOLN
500.0000 [IU] | Freq: Once | INTRAVENOUS | Status: AC | PRN
  Administered 2021-03-04: 500 [IU]
  Filled 2021-03-04: qty 5

## 2021-03-04 MED ORDER — ACETAMINOPHEN 325 MG PO TABS
ORAL_TABLET | ORAL | Status: AC
  Filled 2021-03-04: qty 2

## 2021-03-04 MED ORDER — SODIUM CHLORIDE 0.9% FLUSH
10.0000 mL | INTRAVENOUS | Status: DC | PRN
  Administered 2021-03-04: 10 mL
  Filled 2021-03-04: qty 10

## 2021-03-04 MED ORDER — DIPHENHYDRAMINE HCL 25 MG PO CAPS
ORAL_CAPSULE | ORAL | Status: AC
  Filled 2021-03-04: qty 2

## 2021-03-04 MED ORDER — SODIUM CHLORIDE 0.9 % IV SOLN
Freq: Once | INTRAVENOUS | Status: AC
  Filled 2021-03-04: qty 250

## 2021-03-04 MED ORDER — DIPHENHYDRAMINE HCL 25 MG PO CAPS
50.0000 mg | ORAL_CAPSULE | Freq: Once | ORAL | Status: AC
  Administered 2021-03-04: 50 mg via ORAL

## 2021-03-04 MED ORDER — TRASTUZUMAB-ANNS CHEMO 150 MG IV SOLR
6.0000 mg/kg | Freq: Once | INTRAVENOUS | Status: AC
  Administered 2021-03-04: 567 mg via INTRAVENOUS
  Filled 2021-03-04: qty 27

## 2021-03-04 NOTE — Progress Notes (Signed)
Per Dr. Burr Medico okay to treat with ECHO from 08/2020.

## 2021-03-04 NOTE — Patient Instructions (Signed)
[facility]   ?Discharge Instructions: ?Thank you for choosing [facility] to provide your oncology and hematology care.  ? ?If you have a lab appointment with the Cancer Center, please go directly to the Cancer Center and check in at the registration area. ?  ?Wear comfortable clothing and clothing appropriate for easy access to any Portacath or PICC line.  ? ?We strive to give you quality time with your provider. You may need to reschedule your appointment if you arrive late (15 or more minutes).  Arriving late affects you and other patients whose appointments are after yours.  Also, if you miss three or more appointments without notifying the office, you may be dismissed from the clinic at the provider?s discretion.    ?  ?For prescription refill requests, have your pharmacy contact our office and allow 72 hours for refills to be completed.   ? ?Today you received the following chemotherapy and/or immunotherapy agents: trastuzumab-anns    ?  ?To help prevent nausea and vomiting after your treatment, we encourage you to take your nausea medication as directed. ? ?BELOW ARE SYMPTOMS THAT SHOULD BE REPORTED IMMEDIATELY: ?*FEVER GREATER THAN 100.4 F (38 ?C) OR HIGHER ?*CHILLS OR SWEATING ?*NAUSEA AND VOMITING THAT IS NOT CONTROLLED WITH YOUR NAUSEA MEDICATION ?*UNUSUAL SHORTNESS OF BREATH ?*UNUSUAL BRUISING OR BLEEDING ?*URINARY PROBLEMS (pain or burning when urinating, or frequent urination) ?*BOWEL PROBLEMS (unusual diarrhea, constipation, pain near the anus) ?TENDERNESS IN MOUTH AND THROAT WITH OR WITHOUT PRESENCE OF ULCERS (sore throat, sores in mouth, or a toothache) ?UNUSUAL RASH, SWELLING OR PAIN  ?UNUSUAL VAGINAL DISCHARGE OR ITCHING  ? ?Items with * indicate a potential emergency and should be followed up as soon as possible or go to the Emergency Department if any problems should occur. ? ?Please show the CHEMOTHERAPY ALERT CARD or IMMUNOTHERAPY ALERT CARD at  check-in to the Emergency Department and triage nurse. ? ?Should you have questions after your visit or need to cancel or reschedule your appointment, please contact [facility]  Dept: [phone]  and follow the prompts.  Office hours are 8:00 a.m. to 4:30 p.m. Monday - Friday. Please note that voicemails left after 4:00 p.m. may not be returned until the following business day.  We are closed weekends and major holidays. You have access to a nurse at all times for urgent questions. Please call the main number to the clinic Dept: [phone] and follow the prompts. ? ? ?For any non-urgent questions, you may also contact your provider using MyChart. We now offer e-Visits for anyone 18 and older to request care online for non-urgent symptoms. For details visit [email or web address]. ?  ?Also download the MyChart app! Go to the app store, search "MyChart", open the app, select , and log in with your MyChart username and password. ? ?Due to Covid, a mask is required upon entering the hospital/clinic. If you do not have a mask, one will be given to you upon arrival. For doctor visits, patients may have 1 support person aged 18 or older with them. For treatment visits, patients cannot have anyone with them due to current Covid guidelines and our immunocompromised population.  ? ?

## 2021-03-04 NOTE — Telephone Encounter (Signed)
I spoke with Cheryl Blankenship daughter Cheryl Blankenship at Dr Ernestina Penna request regarding echo test.  I told Cheryl Blankenship that per Dr Burr Medico Cheryl Blankenship needs to have her echocardiogram to have any more treatment with trastuzumab.  I reviewed upcoming echo appt with Cheryl Blankenship.  She verbalized understanding.  All questions were answered.

## 2021-03-05 ENCOUNTER — Ambulatory Visit (HOSPITAL_COMMUNITY)
Admission: RE | Admit: 2021-03-05 | Discharge: 2021-03-05 | Disposition: A | Discharge status: Home / Self Care | Payer: Medicare HMO | Source: Ambulatory Visit | Attending: Cardiovascular Disease | Admitting: Cardiovascular Disease

## 2021-03-05 ENCOUNTER — Ambulatory Visit
Admission: RE | Admit: 2021-03-05 | Discharge: 2021-03-05 | Disposition: A | Discharge status: Home / Self Care | Payer: Medicare HMO | Source: Ambulatory Visit | Attending: Radiation Oncology | Admitting: Radiation Oncology

## 2021-03-05 ENCOUNTER — Other Ambulatory Visit: Payer: Self-pay

## 2021-03-05 DIAGNOSIS — I110 Hypertensive heart disease with heart failure: Secondary | ICD-10-CM | POA: Diagnosis not present

## 2021-03-05 DIAGNOSIS — C50912 Malignant neoplasm of unspecified site of left female breast: Secondary | ICD-10-CM | POA: Diagnosis not present

## 2021-03-05 DIAGNOSIS — Z8673 Personal history of transient ischemic attack (TIA), and cerebral infarction without residual deficits: Secondary | ICD-10-CM | POA: Insufficient documentation

## 2021-03-05 DIAGNOSIS — I5032 Chronic diastolic (congestive) heart failure: Secondary | ICD-10-CM | POA: Diagnosis present

## 2021-03-05 DIAGNOSIS — E119 Type 2 diabetes mellitus without complications: Secondary | ICD-10-CM | POA: Diagnosis not present

## 2021-03-05 LAB — ECHOCARDIOGRAM COMPLETE
Area-P 1/2: 3.5 cm2
S' Lateral: 3.1 cm

## 2021-03-05 NOTE — Progress Notes (Signed)
  Echocardiogram 2D Echocardiogram has been performed.  Cheryl Blankenship G Andrea Colglazier 03/05/2021, 11:27 AM

## 2021-03-08 ENCOUNTER — Other Ambulatory Visit: Payer: Self-pay

## 2021-03-08 ENCOUNTER — Ambulatory Visit
Admission: RE | Admit: 2021-03-08 | Discharge: 2021-03-08 | Disposition: A | Discharge status: Home / Self Care | Payer: Medicare HMO | Source: Ambulatory Visit | Attending: Radiation Oncology | Admitting: Radiation Oncology

## 2021-03-08 DIAGNOSIS — C50912 Malignant neoplasm of unspecified site of left female breast: Secondary | ICD-10-CM | POA: Diagnosis not present

## 2021-03-09 ENCOUNTER — Ambulatory Visit
Admission: RE | Admit: 2021-03-09 | Discharge: 2021-03-09 | Disposition: A | Discharge status: Home / Self Care | Payer: Medicare HMO | Source: Ambulatory Visit | Attending: Radiation Oncology | Admitting: Radiation Oncology

## 2021-03-09 DIAGNOSIS — C50912 Malignant neoplasm of unspecified site of left female breast: Secondary | ICD-10-CM | POA: Diagnosis not present

## 2021-03-10 ENCOUNTER — Ambulatory Visit
Admission: RE | Admit: 2021-03-10 | Discharge: 2021-03-10 | Disposition: A | Discharge status: Home / Self Care | Payer: Medicare HMO | Source: Ambulatory Visit | Attending: Radiation Oncology | Admitting: Radiation Oncology

## 2021-03-10 ENCOUNTER — Other Ambulatory Visit: Payer: Self-pay

## 2021-03-10 ENCOUNTER — Ambulatory Visit: Payer: Medicare HMO | Admitting: Physical Therapy

## 2021-03-10 DIAGNOSIS — C50412 Malignant neoplasm of upper-outer quadrant of left female breast: Secondary | ICD-10-CM

## 2021-03-10 DIAGNOSIS — Z483 Aftercare following surgery for neoplasm: Secondary | ICD-10-CM | POA: Diagnosis not present

## 2021-03-10 DIAGNOSIS — R296 Repeated falls: Secondary | ICD-10-CM

## 2021-03-10 DIAGNOSIS — R293 Abnormal posture: Secondary | ICD-10-CM

## 2021-03-10 DIAGNOSIS — R262 Difficulty in walking, not elsewhere classified: Secondary | ICD-10-CM

## 2021-03-10 DIAGNOSIS — M25612 Stiffness of left shoulder, not elsewhere classified: Secondary | ICD-10-CM

## 2021-03-10 DIAGNOSIS — M25611 Stiffness of right shoulder, not elsewhere classified: Secondary | ICD-10-CM

## 2021-03-10 DIAGNOSIS — C50912 Malignant neoplasm of unspecified site of left female breast: Secondary | ICD-10-CM | POA: Diagnosis not present

## 2021-03-10 DIAGNOSIS — M6281 Muscle weakness (generalized): Secondary | ICD-10-CM

## 2021-03-10 DIAGNOSIS — I890 Lymphedema, not elsewhere classified: Secondary | ICD-10-CM

## 2021-03-10 DIAGNOSIS — Z170 Estrogen receptor positive status [ER+]: Secondary | ICD-10-CM

## 2021-03-10 NOTE — Therapy (Signed)
[place], [place], [address] Phone: [phone]   Fax:  [phone]  Physical Therapy Treatment  Patient Details  Name: Cheryl Blankenship MRN: [id number] Date of Birth: 1950-12-20 Referring Provider (PT): Shona Simpson   Encounter Date: 03/10/2021   PT End of Session - 03/10/21 1604     Visit Number 5    Number of Visits 18    Date for PT Re-Evaluation 03/12/21    PT Start Time 1500    PT Stop Time 6283    PT Time Calculation (min) 48 min    Activity Tolerance Patient tolerated treatment well    Behavior During Therapy [facility] for tasks assessed/performed             Past Medical History:  Diagnosis Date   Anxiety    Breast cancer ([place])    CHF (congestive heart failure) ([place])    Depression    Diabetes mellitus without complication ([place])    type 2   GERD (gastroesophageal reflux disease)    History of kidney stones    Hypertension    PONV (postoperative nausea and vomiting)    Stroke [facility])     Past Surgical History:  Procedure Laterality Date   APPENDECTOMY     CHOLECYSTECTOMY     COLONOSCOPY     ESOPHAGOGASTRODUODENOSCOPY (EGD) WITH PROPOFOL N/A 11/01/2020   Procedure: ESOPHAGOGASTRODUODENOSCOPY (EGD) WITH PROPOFOL;  Surgeon: Arta Silence, MD;  [facility];  Service: Endoscopy;  Laterality: N/A;   EYE SURGERY     cataracts   MASTECTOMY WITH AXILLARY LYMPH NODE DISSECTION Left 12/30/2020   Procedure: LEFT MASTECTOMY WITH TARGETED AXILLARY LYMPH NODE DISSECTION;  Surgeon: [name], MD;  Location: [place];  Service: General;  Laterality: Left;   MULTIPLE TOOTH EXTRACTIONS     PORTACATH PLACEMENT N/A 08/04/2020   Procedure: INSERTION PORT-A-CATH WITH ULTRASOUND GUIDANCE;  Surgeon: [name], MD;  Location: [place];  Service: General;  Laterality: N/A;   STOMACH SURGERY      There were no vitals filed for this visit.   Subjective Assessment - 03/10/21 1505      Subjective Pt states she had her treatment this morning and then she went home and had a nap.  She got some new medicine and that has been helping her.    Pertinent History Patient was diagnosed on 05/20/2020 with left grade II invasive ductal carcinoma breast cancer. It measures 12 cm and is mostly located in the upper outer quadrant. It is ER/PR positive and HER2 negative with a Ki67 of 25%. She had chemotherpy tom 11/7//21-1/31/2022. She had numbness in her fingers and toes from that. She had a left mastectomy 12/30/2020 with 0/5 nodes removed.  She has a hx of a heart attack, stroke, and heart failure and diabetes. She is very limited functionally and currently walks with a cane;    Patient Stated Goals to use her arm more    Currently in Pain? Yes    Pain Score 8     Pain Location Arm   also in her feet   Pain Orientation Left                               [facility] Adult PT Treatment/Exercise - 03/10/21 0001       Exercises   Exercises Shoulder;Elbow;Knee/Hip;Ankle;Lumbar    Other Exercises  neck and thoracic ROM in sitting. Pt has diffiuclty with  shoulder rolls and scapular ROM      Lumbar Exercises: Seated   Other Seated Lumbar Exercises sitting on dynadisc. 5 reps of anterior/posteriro tilts, lateral tilts hands to front then hip to hip x 3 rounds      Lumbar Exercises: Supine   Clam 5 reps    Clam Limitations one side at a time    Bridge 5 reps    Bridge Limitations pt not able to lift hips off table    Other Supine Lumbar Exercises attempted lower trunk rotations      Shoulder Exercises: Supine   Protraction AROM;Right;5 reps    External Rotation AAROM;Left    Flexion AAROM;Left      Shoulder Exercises: Seated   Other Seated Exercises UE ranger with hand at elbow height forward and back with each side for 10 reps      Manual Therapy   Manual Therapy Edema management;Manual Lymphatic Drainage (MLD)    Manual therapy comments pt with tightness at left chest.  did not work on that skin due to radiation.    Manual Lymphatic Drainage (MLD) diaphragmatic breaths, short neck around radiation field,  left shoulder and upper arm to hand and return.    Passive ROM to left shoulder as tolerated, pt limited by pain                      PT Short Term Goals - 03/10/21 1607       PT SHORT TERM GOAL #1   Title Pt will be able to sit to stand from a chair 1x without using hands    Time 4    Period Weeks    Status On-going      PT SHORT TERM GOAL #2   Title Pt will be able to ambulate with a step through gait pattern to improve walking speed    Status Achieved      PT SHORT TERM GOAL #3   Title Pt will be indpendent in bed mobility to decrease reliance on caregivers    Time 4    Period Weeks    Status On-going      PT SHORT TERM GOAL #4   Title Pt will be independent in a home exercise program for LE strengthening to decrease fall risk    Time 4    Status On-going               PT Long Term Goals - 03/10/21 1606       PT LONG TERM GOAL #1   Title Pt will have 120 degrees of left shoulder abduction so that she can tolerate radiation .    Time 4    Period Months    Status On-going      PT LONG TERM GOAL #2   Title Pt will be independent a home exercise program for shoulder ROM and strength    Time 4    Period Weeks    Status On-going      PT LONG TERM GOAL #4   Title Pt will be able to sit to stand from a chair without use of UEs 8x in 30 sec to decrease fall risk.    Period Weeks    Status On-going      PT LONG TERM GOAL #5   Title Pt will be able to ambulate with step through gait pattern using least restrictive device and demonstrate correct use of device during ambulation    Time 8  Status On-going                   Plan - 03/10/21 1557     Clinical Impression Statement Pt appears to be doing a bit better today.  She has firm congestion in left chest at site of radiation that we are not doing any  manual work on to protect the skin from outside friction. She continues to have limited range of motion in left arm and is not able to tolerate much ROM due to pain. She did 2 rounds of interval exericses for general strength and mobility and was able to do them with cues but needed some extra time. Performed MLD to left arm as it appears to be congested and it felt palpably better, but she said she still had discomfort.  Pt will need continued PT to help with mobility during radiation as well as more focused work on lymphedema once radiation is complete    Personal Factors and Comorbidities Comorbidity 3+    Comorbidities Hx heart attack, CVA, CHF,DM  multiple recent falls mastectomy    Stability/Clinical Decision Making Evolving/Moderate complexity    PT Frequency 2x / week    PT Treatment/Interventions ADLs/Self Care Home Management;Therapeutic exercise;Patient/family education;Gait training;Stair training;Functional mobility training;Therapeutic activities;Neuromuscular re-education;Balance training;Manual techniques;Manual lymph drainage;Passive range of motion;Orthotic Fit/Training;Taping    PT Next Visit Plan A/PROM MLD to decrease congestion and decrease pain as able AROM of neck thoracic area, shoulders.  sit to stand from a high mat, weight shift, posture continue  UE ranger for active left shoulder work    [employer] and Agree with Plan of Care Patient             Patient will benefit from skilled therapeutic intervention in order to improve the following deficits and impairments:  Postural dysfunction, Decreased range of motion, Decreased knowledge of precautions, Impaired UE functional use, Pain, Decreased balance, Decreased activity tolerance, Increased edema, Abnormal gait, Decreased mobility, Decreased endurance, Difficulty walking, Increased muscle spasms, Decreased strength, Increased fascial restricitons, Obesity, Impaired flexibility, Impaired perceived functional ability  Visit  Diagnosis: Aftercare following surgery for neoplasm  Stiffness of left shoulder, not elsewhere classified  Stiffness of right shoulder, not elsewhere classified  Lymphedema, not elsewhere classified  Muscle weakness (generalized)  Difficulty in walking, not elsewhere classified  Abnormal posture  Repeated falls  Malignant neoplasm of upper-outer quadrant of left breast in female, estrogen receptor positive ([place])     Problem List Patient Active Problem List   Diagnosis Date Noted   Invasive ductal carcinoma of breast, female, left ([place]) 12/30/2020   Chest pain 10/30/2020   Constipation 10/30/2020   Type 2 diabetes mellitus without complication ([place]) 72/53/6644   Exertional dyspnea 08/26/2020   Essential hypertension 03/47/4259   Acute diastolic CHF (congestive heart failure) ([place]) 08/26/2020   Obesity, diabetes, and hypertension syndrome ([place]) 08/26/2020   Malignant neoplasm of upper-outer quadrant of left breast in female, estrogen receptor positive ([place]) 07/20/2020   Donato Heinz. Owens Shark PT  Norwood Levo 03/10/2021, 4:09 PM  [place], [place], [address] Phone: [phone]   Fax:  [phone]  Name: Cheryl Blankenship MRN: [id number] Date of Birth: [birth year]

## 2021-03-11 ENCOUNTER — Ambulatory Visit
Admission: RE | Admit: 2021-03-11 | Discharge: 2021-03-11 | Disposition: A | Discharge status: Home / Self Care | Payer: Medicare HMO | Source: Ambulatory Visit | Attending: Radiation Oncology | Admitting: Radiation Oncology

## 2021-03-11 DIAGNOSIS — C50912 Malignant neoplasm of unspecified site of left female breast: Secondary | ICD-10-CM | POA: Diagnosis not present

## 2021-03-12 ENCOUNTER — Encounter: Payer: Self-pay | Admitting: Hematology

## 2021-03-12 ENCOUNTER — Other Ambulatory Visit: Payer: Self-pay

## 2021-03-12 ENCOUNTER — Ambulatory Visit
Admission: RE | Admit: 2021-03-12 | Discharge: 2021-03-12 | Disposition: A | Discharge status: Home / Self Care | Payer: Medicare HMO | Source: Ambulatory Visit | Attending: Radiation Oncology | Admitting: Radiation Oncology

## 2021-03-12 ENCOUNTER — Ambulatory Visit: Payer: Medicare HMO

## 2021-03-12 DIAGNOSIS — C50912 Malignant neoplasm of unspecified site of left female breast: Secondary | ICD-10-CM | POA: Diagnosis not present

## 2021-03-15 ENCOUNTER — Ambulatory Visit
Admission: RE | Admit: 2021-03-15 | Discharge: 2021-03-15 | Disposition: A | Discharge status: Home / Self Care | Payer: Medicare HMO | Source: Ambulatory Visit | Attending: Radiation Oncology | Admitting: Radiation Oncology

## 2021-03-15 ENCOUNTER — Other Ambulatory Visit: Payer: Self-pay

## 2021-03-15 DIAGNOSIS — C50912 Malignant neoplasm of unspecified site of left female breast: Secondary | ICD-10-CM | POA: Diagnosis not present

## 2021-03-16 ENCOUNTER — Ambulatory Visit
Admission: RE | Admit: 2021-03-16 | Discharge: 2021-03-16 | Disposition: A | Discharge status: Home / Self Care | Payer: Medicare HMO | Source: Ambulatory Visit | Attending: Radiation Oncology | Admitting: Radiation Oncology

## 2021-03-16 DIAGNOSIS — C50912 Malignant neoplasm of unspecified site of left female breast: Secondary | ICD-10-CM | POA: Diagnosis not present

## 2021-03-17 ENCOUNTER — Ambulatory Visit
Admission: RE | Admit: 2021-03-17 | Discharge: 2021-03-17 | Disposition: A | Discharge status: Home / Self Care | Payer: Medicare HMO | Source: Ambulatory Visit | Attending: Radiation Oncology | Admitting: Radiation Oncology

## 2021-03-17 ENCOUNTER — Other Ambulatory Visit: Payer: Self-pay

## 2021-03-17 DIAGNOSIS — C50912 Malignant neoplasm of unspecified site of left female breast: Secondary | ICD-10-CM | POA: Diagnosis not present

## 2021-03-18 ENCOUNTER — Ambulatory Visit
Admission: RE | Admit: 2021-03-18 | Discharge: 2021-03-18 | Disposition: A | Discharge status: Home / Self Care | Payer: Medicare HMO | Source: Ambulatory Visit | Attending: Radiation Oncology | Admitting: Radiation Oncology

## 2021-03-18 ENCOUNTER — Ambulatory Visit: Payer: Medicare HMO | Attending: Hematology | Admitting: Rehabilitation

## 2021-03-18 DIAGNOSIS — C50912 Malignant neoplasm of unspecified site of left female breast: Secondary | ICD-10-CM | POA: Diagnosis not present

## 2021-03-19 ENCOUNTER — Ambulatory Visit
Admission: RE | Admit: 2021-03-19 | Discharge: 2021-03-19 | Disposition: A | Discharge status: Home / Self Care | Payer: Medicare HMO | Source: Ambulatory Visit | Attending: Radiation Oncology | Admitting: Radiation Oncology

## 2021-03-19 ENCOUNTER — Ambulatory Visit: Payer: Medicare HMO | Admitting: Radiation Oncology

## 2021-03-19 ENCOUNTER — Encounter (INDEPENDENT_AMBULATORY_CARE_PROVIDER_SITE_OTHER): Payer: Medicare HMO | Admitting: Ophthalmology

## 2021-03-19 ENCOUNTER — Other Ambulatory Visit: Payer: Self-pay | Admitting: Radiation Oncology

## 2021-03-19 ENCOUNTER — Other Ambulatory Visit: Payer: Self-pay

## 2021-03-19 DIAGNOSIS — Z170 Estrogen receptor positive status [ER+]: Secondary | ICD-10-CM | POA: Diagnosis not present

## 2021-03-19 DIAGNOSIS — Z510 Encounter for antineoplastic radiation therapy: Secondary | ICD-10-CM | POA: Diagnosis present

## 2021-03-19 DIAGNOSIS — C50912 Malignant neoplasm of unspecified site of left female breast: Secondary | ICD-10-CM | POA: Insufficient documentation

## 2021-03-19 DIAGNOSIS — C50412 Malignant neoplasm of upper-outer quadrant of left female breast: Secondary | ICD-10-CM | POA: Insufficient documentation

## 2021-03-19 MED ORDER — RADIAPLEXRX EX GEL: Freq: Once | CUTANEOUS | Status: AC

## 2021-03-19 MED ORDER — PROCHLORPERAZINE MALEATE 10 MG PO TABS: 10.0000 mg | ORAL_TABLET | Freq: Four times a day (QID) | ORAL | 5 refills | Status: DC | PRN

## 2021-03-20 ENCOUNTER — Other Ambulatory Visit: Payer: Self-pay | Admitting: Nurse Practitioner

## 2021-03-23 ENCOUNTER — Encounter: Payer: Self-pay | Admitting: Hematology

## 2021-03-23 ENCOUNTER — Ambulatory Visit
Admission: RE | Admit: 2021-03-23 | Discharge: 2021-03-23 | Disposition: A | Discharge status: Home / Self Care | Payer: Medicare HMO | Source: Ambulatory Visit | Attending: Radiation Oncology | Admitting: Radiation Oncology

## 2021-03-23 ENCOUNTER — Other Ambulatory Visit: Payer: Self-pay

## 2021-03-23 DIAGNOSIS — Z510 Encounter for antineoplastic radiation therapy: Secondary | ICD-10-CM | POA: Diagnosis not present

## 2021-03-24 ENCOUNTER — Ambulatory Visit
Admission: RE | Admit: 2021-03-24 | Discharge: 2021-03-24 | Disposition: A | Discharge status: Home / Self Care | Payer: Medicare HMO | Source: Ambulatory Visit | Attending: Radiation Oncology | Admitting: Radiation Oncology

## 2021-03-24 DIAGNOSIS — Z510 Encounter for antineoplastic radiation therapy: Secondary | ICD-10-CM | POA: Diagnosis not present

## 2021-03-25 ENCOUNTER — Other Ambulatory Visit: Payer: Self-pay

## 2021-03-25 ENCOUNTER — Inpatient Hospital Stay: Payer: Medicare HMO | Attending: Nurse Practitioner

## 2021-03-25 ENCOUNTER — Ambulatory Visit
Admission: RE | Admit: 2021-03-25 | Discharge: 2021-03-25 | Disposition: A | Discharge status: Home / Self Care | Payer: Medicare HMO | Source: Ambulatory Visit | Attending: Radiation Oncology | Admitting: Radiation Oncology

## 2021-03-25 ENCOUNTER — Encounter: Payer: Self-pay | Admitting: Nurse Practitioner

## 2021-03-25 ENCOUNTER — Inpatient Hospital Stay: Payer: Medicare HMO

## 2021-03-25 ENCOUNTER — Inpatient Hospital Stay (HOSPITAL_BASED_OUTPATIENT_CLINIC_OR_DEPARTMENT_OTHER): Payer: Medicare HMO | Admitting: Nurse Practitioner

## 2021-03-25 VITALS — BP 141/68 | HR 77 | Temp 98.8°F | Resp 18 | Ht 63.0 in | Wt 209.8 lb

## 2021-03-25 DIAGNOSIS — Z95828 Presence of other vascular implants and grafts: Secondary | ICD-10-CM

## 2021-03-25 DIAGNOSIS — C50412 Malignant neoplasm of upper-outer quadrant of left female breast: Secondary | ICD-10-CM

## 2021-03-25 DIAGNOSIS — C773 Secondary and unspecified malignant neoplasm of axilla and upper limb lymph nodes: Secondary | ICD-10-CM | POA: Insufficient documentation

## 2021-03-25 DIAGNOSIS — Z170 Estrogen receptor positive status [ER+]: Secondary | ICD-10-CM

## 2021-03-25 DIAGNOSIS — R300 Dysuria: Secondary | ICD-10-CM

## 2021-03-25 DIAGNOSIS — Z5112 Encounter for antineoplastic immunotherapy: Secondary | ICD-10-CM | POA: Diagnosis present

## 2021-03-25 DIAGNOSIS — Z79899 Other long term (current) drug therapy: Secondary | ICD-10-CM | POA: Diagnosis not present

## 2021-03-25 DIAGNOSIS — Z510 Encounter for antineoplastic radiation therapy: Secondary | ICD-10-CM | POA: Diagnosis not present

## 2021-03-25 LAB — CMP (CANCER CENTER ONLY)
ALT: 15 U/L (ref 0–44)
AST: 16 U/L (ref 15–41)
Albumin: 3.6 g/dL (ref 3.5–5.0)
Alkaline Phosphatase: 71 U/L (ref 38–126)
Anion gap: 7 (ref 5–15)
BUN: 17 mg/dL (ref 8–23)
CO2: 28 mmol/L (ref 22–32)
Calcium: 11.1 mg/dL — ABNORMAL HIGH (ref 8.9–10.3)
Chloride: 108 mmol/L (ref 98–111)
Creatinine: 0.89 mg/dL (ref 0.44–1.00)
GFR, Estimated: >60 mL/min (ref >60)
Glucose, Bld: 84 mg/dL (ref 70–99)
Potassium: 3.9 mmol/L (ref 3.5–5.1)
Sodium: 143 mmol/L (ref 135–145)
Total Bilirubin: 0.5 mg/dL (ref 0.3–1.2)
Total Protein: 7.2 g/dL (ref 6.5–8.1)

## 2021-03-25 LAB — CBC WITH DIFFERENTIAL (CANCER CENTER ONLY)
Abs Immature Granulocytes: 0.01 10*3/uL (ref 0.00–0.07)
Basophils Absolute: 0 10*3/uL (ref 0.0–0.1)
Basophils Relative: 0 %
Eosinophils Absolute: 0.2 10*3/uL (ref 0.0–0.5)
Eosinophils Relative: 3 %
HCT: 33.7 % — ABNORMAL LOW (ref 36.0–46.0)
Hemoglobin: 11 g/dL — ABNORMAL LOW (ref 12.0–15.0)
Immature Granulocytes: 0 %
Lymphocytes Relative: 15 %
Lymphs Abs: 0.8 10*3/uL (ref 0.7–4.0)
MCH: 28.3 pg (ref 26.0–34.0)
MCHC: 32.6 g/dL (ref 30.0–36.0)
MCV: 86.6 fL (ref 80.0–100.0)
Monocytes Absolute: 0.4 10*3/uL (ref 0.1–1.0)
Monocytes Relative: 8 %
Neutro Abs: 4 10*3/uL (ref 1.7–7.7)
Neutrophils Relative %: 74 %
Platelet Count: 235 10*3/uL (ref 150–400)
RBC: 3.89 MIL/uL (ref 3.87–5.11)
RDW: 12.8 % (ref 11.5–15.5)
WBC Count: 5.4 10*3/uL (ref 4.0–10.5)
nRBC: 0 % (ref 0.0–0.2)

## 2021-03-25 LAB — URINALYSIS, COMPLETE (UACMP) WITH MICROSCOPIC
Bilirubin Urine: NEGATIVE
Glucose, UA: 150 mg/dL — AB
Hgb urine dipstick: NEGATIVE
Ketones, ur: NEGATIVE mg/dL
Leukocytes,Ua: NEGATIVE
Nitrite: NEGATIVE
Protein, ur: NEGATIVE mg/dL
Specific Gravity, Urine: 1.018 (ref 1.005–1.030)
pH: 6 (ref 5.0–8.0)

## 2021-03-25 MED ORDER — DIPHENHYDRAMINE HCL 25 MG PO CAPS
50.0000 mg | ORAL_CAPSULE | Freq: Once | ORAL | Status: AC
  Administered 2021-03-25: 50 mg via ORAL

## 2021-03-25 MED ORDER — HEPARIN SOD (PORK) LOCK FLUSH 100 UNIT/ML IV SOLN
500.0000 [IU] | Freq: Once | INTRAVENOUS | Status: AC | PRN
  Administered 2021-03-25: 500 [IU]
  Filled 2021-03-25: qty 5

## 2021-03-25 MED ORDER — EXEMESTANE 25 MG PO TABS: 25.0000 mg | ORAL_TABLET | Freq: Every day | ORAL | 2 refills | Status: DC

## 2021-03-25 MED ORDER — ACETAMINOPHEN 325 MG PO TABS
ORAL_TABLET | ORAL | Status: AC
  Filled 2021-03-25: qty 2

## 2021-03-25 MED ORDER — SODIUM CHLORIDE 0.9 % IV SOLN
Freq: Once | INTRAVENOUS | Status: AC
Start: 2021-03-25 — End: 2021-03-25
  Filled 2021-03-25: qty 250

## 2021-03-25 MED ORDER — TRASTUZUMAB-ANNS CHEMO 150 MG IV SOLR
6.0000 mg/kg | Freq: Once | INTRAVENOUS | Status: AC
  Administered 2021-03-25: 567 mg via INTRAVENOUS
  Filled 2021-03-25: qty 27

## 2021-03-25 MED ORDER — DIPHENHYDRAMINE HCL 25 MG PO CAPS
ORAL_CAPSULE | ORAL | Status: AC
  Filled 2021-03-25: qty 1

## 2021-03-25 MED ORDER — SODIUM CHLORIDE 0.9% FLUSH
10.0000 mL | INTRAVENOUS | Status: DC | PRN
  Administered 2021-03-25: 10 mL
  Filled 2021-03-25: qty 10

## 2021-03-25 MED ORDER — ACETAMINOPHEN 325 MG PO TABS
650.0000 mg | ORAL_TABLET | Freq: Once | ORAL | Status: AC
  Administered 2021-03-25: 650 mg via ORAL

## 2021-03-25 MED ORDER — SODIUM CHLORIDE 0.9% FLUSH
10.0000 mL | INTRAVENOUS | Status: AC | PRN
  Administered 2021-03-25: 10 mL
  Filled 2021-03-25: qty 10

## 2021-03-25 NOTE — Progress Notes (Signed)
[place]   [phone] [phone]   Clinic Follow up Note   Patient Care Team: [name], PA-C as PCP - General (Internal Medicine) [name], RN as Oncology Nurse Navigator [name], RN as Oncology Nurse Navigator [name], MD as Consulting Physician (General Surgery) [name], MD as Consulting Physician (Hematology) [name], MD as Consulting Physician (Radiation Oncology) [name], [name], MD as Consulting Physician (Cardiology) 03/25/2021  CHIEF COMPLAINT: Follow up left breast cancer   SUMMARY OF ONCOLOGIC HISTORY: Oncology History Overview Note  Cancer Staging Malignant neoplasm of upper-outer quadrant of left breast in female, estrogen receptor positive ([place]) Staging form: Breast, AJCC 8th Edition - Clinical stage from 07/16/2020: Stage IB (cT2, cN1, cM0, G2, ER+, PR+, HER2+) - Signed by [name], MD on 07/21/2020    Malignant neoplasm of upper-outer quadrant of left breast in female, estrogen receptor positive ([place])  07/01/2020 Mammogram   IMPRESSION: 1. There is a 12 cm span of highly suspicious calcifications involving the upper outer and upper inner quadrants of the left breast. On ultrasound, within this area of calcifications, there are confluent masses at 1 o'clock spanning at least 4.5 cm. An additional suspicious mass is seen in the left breast at 2 o'clock.  -Ultrasound targeted to the left breast at 1 o'clock, 5 cm from the nipple demonstrates a broad area of hypoechoic irregular tissue/confluent masses spanning at least 4.5 cm. There is a separate oval hypoechoic mass with indistinct margins at 2 o'clock, 7 cm from the nipple measuring 0.9 x 0.4 x 0.9 cm. Ultrasound of the left axilla demonstrates 2 abnormally thickened lymph nodes with cortices measuring 6-7 mm. There is an additional lymph node with a borderline cortex of 4 mm.   2. There are 2 lymph nodes with thickened  cortices in the left axilla, and an additional lymph node with a borderline thickened cortex.   07/16/2020 Cancer Staging   Staging form: Breast, AJCC 8th Edition - Clinical stage from 07/16/2020: Stage IIA (cT3, cN1, cM0, G2, ER+, PR+, HER2+) - Signed by [name], MD on 08/05/2020    07/16/2020 Initial Biopsy   Diagnosis 1. 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 Breast, left, needle core biopsy, 1 o'clock - INVASIVE DUCTAL CARCINOMA - SEE COMMENT 3. Lymph node, biopsy - METASTATIC CARCINOMA INVOLVING A LYMPH NODE - SEE COMMENT Microscopic Comment 1. Based on the biopsy, the ductal carcinoma in situ has a comedo pattern, high nuclear grade and measures 0.7 cm in greatest linear extent. Prognostic markers (ER/PR) are pending and will be reported in an addendum. 2. Based on the biopsy, the carcinoma appears Nottingham grade 2 of 3 and measures 1.5 cm in greatest linear extent. Prognostic markers (ER/PR/ki-67/HER2) are pending and will be reported in an addendum. Dr. Saralyn Pilar reviewed the case and agrees with the above diagnosis. These results were called to [facility] on July 17, 2020. 3. Based on the biopsy, the carcinoma appears measures 1.0 cm in greatest linear extent. Prognostic markers (ER/PR/ki-67/HER2) are pending and will be reported in an addendum.   07/16/2020 Receptors her2   1. PROGNOSTIC INDICATORS Results: IMMUNOHISTOCHEMICAL AND MORPHOMETRIC ANALYSIS PERFORMED MANUALLY Estrogen Receptor: 30%, POSITIVE, STRONG STAINING INTENSITY Progesterone Receptor: 5%, POSITIVE, STRONG STAINING INTENSITY  2. PROGNOSTIC INDICATORS Results: IMMUNOHISTOCHEMICAL AND MORPHOMETRIC ANALYSIS PERFORMED MANUALLY The tumor cells are POSITIVE for Her2 (3+). Estrogen  Receptor: 85%, POSITIVE, STRONG STAINING INTENSITY Progesterone Receptor: 60%, POSITIVE,  STRONG STAINING INTENSITY Proliferation Marker Ki67: 25%  3. PROGNOSTIC INDICATORS Results: IMMUNOHISTOCHEMICAL AND MORPHOMETRIC ANALYSIS PERFORMED MANUALLY The tumor cells are POSITIVE for Her2 (3+). Estrogen Receptor: 95%, POSITIVE, STRONG STAINING INTENSITY Progesterone Receptor: 45%, POSITIVE, STRONG STAINING INTENSITY Proliferation Marker Ki67: 30%   07/20/2020 Initial Diagnosis   Malignant neoplasm of upper-outer quadrant of left breast in female, estrogen receptor positive ([place])    07/31/2020 Echocardiogram   Baseline Echo  IMPRESSIONS     1. Left ventricular ejection fraction, by estimation, is 65 to 70%. The  left ventricle has normal function. The left ventricle has no regional  wall motion abnormalities. Left ventricular diastolic parameters are  indeterminate. The average left  ventricular global longitudinal strain is -17.1 %.   2. Right ventricular systolic function is normal. The right ventricular  size is normal. There is normal pulmonary artery systolic pressure.   3. The mitral valve is normal in structure. Trivial mitral valve  regurgitation. No evidence of mitral stenosis.   4. The aortic valve is normal in structure. Aortic valve regurgitation is  not visualized. No aortic stenosis is present.    07/31/2020 Breast MRI   IMPRESSION: 1. Extensive non mass enhancement within the left breast extending from the nipple to the chest wall. Overall findings are most compatible with extensive malignancy involving the majority of the left breast. 2. Indeterminate enhancing mass within the upper inner right breast. 3. Three morphologically abnormal lymph nodes within the left axilla. One of these nodes has been biopsied compatible with metastatic adenopathy.     08/03/2020 Imaging   Bone Scan whole body  IMPRESSION: Increased radiotracer uptake in the mid to lower lumbar regions of uncertain etiology. Correlation with radiography to assess for potential  arthropathy in these areas advised. Increased uptake in major joints is likely of arthropathic etiology. Increased uptake in the mid face is likely due to paranasal sinus disease. Distribution of radiotracer uptake in bony structures elsewhere unremarkable.   08/04/2020 Procedure   PAC placement by Dr [name]   08/05/2020 - 10/28/2020 Neo-Adjuvant Chemotherapy   Neoadjuvant THP q3weeks for 4-6 cycles starting 08/05/20, followed by Herceptin or Kadcyla q3weeks to complete 1 year of treatment. Stopped THP after 1 dose due to poor toleration       -I switched her to [place] starting 08/25/20. Stopped after C2 due to poor toleration (10/28/20)   08/07/2020 Imaging   CT CAP  IMPRESSION: 2.7 cm mass in the upper outer left breast, likely corresponding to the patient's known primary breast neoplasm.   8 mm short axis left axillary node, suspicious for nodal metastasis in this clinical context.   No findings suspicious for distant metastasis.   Endometrium is mildly prominent, measuring 11 mm. Correlate for vaginal bleeding and consider pelvic ultrasound for further evaluation if clinically warranted.   08/11/2020 Pathology Results   Diagnosis Breast, right, needle core biopsy, right breast - PAPILLARY LESION - SEE COMMENT Microscopic Comment The biopsy consists of a papillary lesion with a focal area of adenosis. Immunohistochemistry (SMM, calponin and p63) shows retention of the myoepithelial cell layer in the focus of adenosis. 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 These results were called to [facility] on August 12, 2020.   10/27/2020 Imaging   CT Angio chest  IMPRESSION: Slightly suboptimal opacification of the main pulmonary artery, however no central or proximal segmental pulmonary embolism   Minimal  bibasilar atelectasis   Aortic Atherosclerosis (ICD10-I70.0).   10/31/2020 Imaging   CT AP  IMPRESSION: 1. No acute  finding by CT other than a large amount of stool and gas in the right colon and gas in the transverse colon. No small bowel obstruction. 2. Previous cholecystectomy. 3. Aortic atherosclerosis. 4. Umbilical hernia containing only fat. 5. Small amount of fluid in the endometrial canal as seen previously.   Aortic Atherosclerosis (ICD10-I70.0).   12/04/2020 Imaging   MRI Breast IMPRESSION: 1. Evidence of a positive response to interval treatment. There has been a significant improvement of both the extent and strength of the enhancement throughout the outer LEFT breast when compared to the earlier MRI of 07/31/2020. 2. However, there remains scattered areas of mass and non-mass enhancement throughout the outer LEFT breast, involving the upper outer quadrant and lower outer quadrant, as detailed above. 3. The 3 morphologically abnormal lymph nodes in the LEFT axilla identified on previous MRI, 1 a biopsy-proven metastasis, are all slightly smaller indicating an additional positive response to interval treatment. 4. No evidence of malignancy within the RIGHT breast.   12/10/2020 -  Chemotherapy    Patient is on Treatment Plan: BREAST TRASTUZUMAB Q21D       12/30/2020 Surgery   A. BREAST, LEFT, MASTECTOMY:  - Focal residual invasive ductal carcinoma status post neoadjuvant  treatment.  - Residual ductal carcinoma in situ.  - Margins of resection are not involved.  - Biopsy clip (X2).   B. LYMPH NODES, LEFT #1, DISSECTION:  - Three lymph nodes, negative for carcinoma (0/3).  - Biopsy clip (X1).   C. LYMPH NODES, LEFT #2, DISSECTION:  - Two lymph nodes, negative for carcinoma (0/2).   PROGNOSTIC INDICATOR RESULTS:  - The tumor cells are POSITIVE for Her2 (3+).  - Estrogen Receptor:       POSITIVE, 90%, STRONG STAINING  - Progesterone Receptor:   POSITIVE, 50%, MODERATE STAINING    12/30/2020 Cancer Staging   Staging form: Breast, AJCC 8th Edition - Pathologic stage from  12/30/2020: No Stage Recommended (ypT1a, pN0, cM0, G2, ER+, PR+, HER2+) - Signed by [name], MD on 01/20/2021  Stage prefix: Post-therapy  Histologic grading system: 3 grade system  Residual tumor (R): R0 - None      CURRENT THERAPY:  Herceptin q3 weeks Exemestane daily Radiation therapy   INTERVAL HISTORY: Ms. Bulls returns for follow up and treatment as scheduled. She was last seen 01/21/21 and continues q3 week herceptin, daily exemestane, and radiation.  Her left chest and axilla are painful and itching, pain up to 10/10 and wakes her up at night in some cases.  On Percocet 5-_0 in the morning and 1 at night.  For the past 2 weeks she has low appetite, nausea, and diarrhea up to 3 episodes per day.  Compazine was prescribed but she has not picked it up yet.  She was told she cannot take Imodium.  She has been eating mostly soup.  She also has some burning with urination.  She is very tired, not able to do much after her radiation treatments.  She continues exemestane, has hot and cold flashes.  Denies new bone pain.  Denies fever, chills, cough, chest pain, dyspnea, or other new concerns.    MEDICAL HISTORY:  Past Medical History:  Diagnosis Date   Anxiety    Breast cancer ([place])    CHF (congestive heart failure) ([place])    Depression    Diabetes mellitus without complication (  [place])    type 2   GERD (gastroesophageal reflux disease)    History of kidney stones    Hypertension    PONV (postoperative nausea and vomiting)    Stroke ([place])     SURGICAL HISTORY: Past Surgical History:  Procedure Laterality Date   APPENDECTOMY     CHOLECYSTECTOMY     COLONOSCOPY     ESOPHAGOGASTRODUODENOSCOPY (EGD) WITH PROPOFOL N/A 11/01/2020   Procedure: ESOPHAGOGASTRODUODENOSCOPY (EGD) WITH PROPOFOL;  Surgeon: Arta Silence, MD;  Location: [facility];  Service: Endoscopy;  Laterality: N/A;   EYE SURGERY     cataracts   MASTECTOMY WITH AXILLARY LYMPH NODE DISSECTION Left 12/30/2020    Procedure: LEFT MASTECTOMY WITH TARGETED AXILLARY LYMPH NODE DISSECTION;  Surgeon: [name], MD;  Location: [facility];  Service: General;  Laterality: Left;   MULTIPLE TOOTH EXTRACTIONS     PORTACATH PLACEMENT N/A 08/04/2020   Procedure: INSERTION PORT-A-CATH WITH ULTRASOUND GUIDANCE;  Surgeon: [name], MD;  Location: [place];  Service: General;  Laterality: N/A;   STOMACH SURGERY      I have reviewed the social history and family history with the patient and they are unchanged from previous note.  ALLERGIES:  is allergic to coconut (cocos nucifera) allergy skin test, hydrochlorothiazide, lisinopril, and coconut oil.  MEDICATIONS:  Current Outpatient Medications  Medication Sig Dispense Refill   Accu-Chek FastClix Lancets MISC      albuterol (VENTOLIN HFA) 108 (90 Base) MCG/ACT inhaler Inhale 1-2 puffs into the lungs every 4 (four) hours as needed for wheezing or shortness of breath. 8 g 0   ALPRAZolam (XANAX) 0.25 MG tablet Take 1-2 tablets (0.25-0.5 mg total) by mouth 3 (three) times daily as needed for anxiety or sleep. 30 tablet 0   aspirin 81 MG chewable tablet Chew 81 mg by mouth daily.      Cholecalciferol 25 MCG (1000 UT) capsule Take 1,000 Units by mouth daily.     diphenoxylate-atropine (LOMOTIL) 2.5-0.025 MG tablet TAKE 1 OR 2 TABLETS BY MOUTH 4 TIMES DAILY AS NEEDED FOR DIARRHEA OR LOOSE STOOLS 60 tablet 2   empagliflozin (JARDIANCE) 10 MG TABS tablet Take 10 mg by mouth daily.     folic acid (FOLVITE) 1 MG tablet Take 1 mg by mouth daily.     furosemide (LASIX) 20 MG tablet Take 40 mg by mouth daily.     gabapentin (NEURONTIN) 400 MG capsule Take 400 mg by mouth 3 (three) times daily.      glipiZIDE (GLUCOTROL XL) 5 MG 24 hr tablet Take 5 mg by mouth daily with breakfast.     insulin aspart (NOVOLOG) 100 UNIT/ML injection Inject 2-10 Units into the skin 3 (three) times daily before meals. Per sliding scale:  150-199 = 2 units, 200-249 = 4 units, 250-299 = 6 units, 300-349  = 8 units, greater than350 = 10 units.     insulin glargine (LANTUS SOLOSTAR) 100 UNIT/ML Solostar Pen Inject 35 Units into the skin 2 (two) times daily. (Patient taking differently: Inject 70 Units into the skin 2 (two) times daily. Pt takes 70 units in the morning and 70 units at night.) 15 mL 11   lidocaine-prilocaine (EMLA) cream Apply 1 application topically as needed (pain). 30 g 3   losartan (COZAAR) 100 MG tablet Take 1 tablet (100 mg total) by mouth daily. 90 tablet 1   metFORMIN (GLUCOPHAGE) 500 MG tablet Take 500 mg by mouth 2 (two) times daily with a meal.      methocarbamol (  ROBAXIN) 500 MG tablet Take 500 mg by mouth every 8 (eight) hours as needed for muscle spasms.     Nutritional Supplements (FEEDING SUPPLEMENT, GLUCERNA 1.2 CAL,) LIQD Take 237 mLs by mouth 3 (three) times daily with meals.     Omega-3 Fatty Acids (FISH OIL) 1000 MG CAPS Take 1,000 mg by mouth daily.     omeprazole (PRILOSEC) 20 MG capsule Take 1 capsule (20 mg total) by mouth daily. 30 capsule 11   ondansetron (ZOFRAN-ODT) 4 MG disintegrating tablet Take 1 tablet (4 mg total) by mouth 2 (two) times daily as needed for up to 30 doses for nausea or vomiting. 30 tablet 0   oxyCODONE-acetaminophen (PERCOCET) 5-325 MG tablet Take 1 tablet by mouth every 8 (eight) hours as needed for severe pain. 30 tablet 0   potassium chloride (KLOR-CON) 10 MEQ tablet Take 20 mEq by mouth daily.     prochlorperazine (COMPAZINE) 10 MG tablet Take 1 tablet (10 mg total) by mouth every 6 (six) hours as needed for nausea or vomiting. 30 tablet 5   RESTASIS 0.05 % ophthalmic emulsion Place 1 drop into both eyes 2 (two) times daily as needed (dry eyes).     spironolactone (ALDACTONE) 25 MG tablet Take 1 tablet (25 mg total) by mouth daily. 90 tablet 1   vitamin B-12 (CYANOCOBALAMIN) 1000 MCG tablet Take 1,000 mcg by mouth daily.     zolpidem (AMBIEN) 5 MG tablet Take 1 tablet (5 mg total) by mouth at bedtime as needed for sleep. 20 tablet 0    amLODipine (NORVASC) 10 MG tablet Take 1 tablet (10 mg total) by mouth daily. (Patient not taking: No sig reported) 30 tablet 10   bisacodyl (DULCOLAX) 5 MG EC tablet Take 1 tablet (5 mg total) by mouth daily as needed for moderate constipation. (Patient not taking: No sig reported) 30 tablet 0   carvedilol (COREG) 25 MG tablet Take 1 tablet (25 mg total) by mouth 2 (two) times daily with a meal. 180 tablet 3   exemestane (AROMASIN) 25 MG tablet Take 1 tablet (25 mg total) by mouth daily after breakfast. 90 tablet 2   promethazine (PHENERGAN) 25 MG suppository Place 1 suppository (25 mg total) rectally every 8 (eight) hours as needed for nausea or vomiting. (Patient not taking: No sig reported) 30 each 1   sucralfate (CARAFATE) 1 GM/10ML suspension Take 10 mLs (1 g total) by mouth 4 (four) times daily -  with meals and at bedtime. (Patient not taking: No sig reported) 420 mL 0   No current facility-administered medications for this visit.   Facility-Administered Medications Ordered in Other Visits  Medication Dose Route Frequency Provider Last Rate Last Admin   sodium chloride flush (NS) 0.9 % injection 10 mL  10 mL Intracatheter PRN [name], MD   10 mL at 03/25/21 1156    PHYSICAL EXAMINATION: ECOG PERFORMANCE STATUS: 2 - Symptomatic, <50% confined to bed  Vitals:   03/25/21 0957  BP: (!) 141/68  Pulse: 77  Resp: 18  Temp: 98.8 F (37.1 C)  SpO2: 98%   Filed Weights   03/25/21 0957  Weight: 209 lb 12.8 oz (95.2 kg)    GENERAL:alert, no distress and comfortable SKIN: Left chest wall and axilla with marked hyperpigmentation, few small shallow ulcerations in the skin folds EYES:  sclera clear LUNGS: normal breathing effort HEART: no lower extremity edema ABDOMEN:abdomen soft, non-tender and normal bowel sounds NEURO: alert & oriented x 3 with fluent speech PAC without  erythema  LABORATORY DATA:  I have reviewed the data as listed CBC Latest Ref Rng & Units 03/25/2021  01/21/2021 01/01/2021  WBC 4.0 - 10.5 K/uL 5.4 6.2 8.9  Hemoglobin 12.0 - 15.0 g/dL 11.0(L) 11.1(L) 10.3(L)  Hematocrit 36.0 - 46.0 % 33.7(L) 34.6(L) 32.9(L)  Platelets 150 - 400 K/uL 235 306 246     CMP Latest Ref Rng & Units 03/25/2021 01/21/2021 01/01/2021  Glucose 70 - 99 mg/dL 84 346(H) 145(H)  BUN 8 - 23 mg/dL 17 26(H) 37(H)  Creatinine 0.44 - 1.00 mg/dL 0.89 1.25(H) 1.31(H)  Sodium 135 - 145 mmol/L 143 139 138  Potassium 3.5 - 5.1 mmol/L 3.9 4.1 4.2  Chloride 98 - 111 mmol/L 108 100 105  CO2 22 - 32 mmol/L _0 Calcium 8.9 - 10.3 mg/dL 11.1(H) 10.3 9.8  Total Protein 6.5 - 8.1 g/dL 7.2 6.9 -  Total Bilirubin 0.3 - 1.2 mg/dL 0.5 0.4 -  Alkaline Phos 38 - 126 U/L 71 72 -  AST 15 - 41 U/L 16 12(L) -  ALT 0 - 44 U/L 15 9 -      RADIOGRAPHIC STUDIES: I have personally reviewed the radiological images as listed and agreed with the findings in the report. No results found.   ASSESSMENT & PLAN: Cheryl Blankenship is a 70 y.o. female with     1. Malignant neoplasm of upper-outer quadrant of left breast, Stage II, c(T2N1M0), ER+/PR+/HER2+, Grade II  -She was diagnosed in 06/2020. She has 12cm calcification and 4.5cm mass in her left breast with 2 abnormal LN. 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  -Her MRI Breast from 07/31/20 shows very extensive disease in her left breast and 3 abnormal LNs in the left axilla. There is indeterminate enhancing 11m mass within the upper inner right breast. 08/11/20 right breast biopsy showed benign papillary lesion. -Her bone scan from 08/03/20 shows uptake in her lumbar spine, but nonspecific. She has known lumbar back pain from arthritis. Her CT CAP from 08/07/20 showed known breast masses and left axillary LN and no distant metastasis. -She started neoadjuvant chemo THP q3weeks on 08/05/20 which was dose reduced with C1. Tolerated poorly with body aches,  n/v/d and switched to kadcyla +/- perjeta. She was hospitalized for CHF exacerbation before she could start it.  Due to poor tolerance, Kadcyla DC'd after cycle 2 -She began exemestane 12/10/2020, tolerating well with hot and cold flashes -S/p left mastectomy on 12/30/20 under Dr. TGeorgette Dover Pathology showed: focal residual invasive ductal carcinoma s/p neoadjuvant treatment; residual DCIS; margins of resection not involved; all 5 lymph nodes negative (0/5). She had a minimal residual cancer, excellent response to neoadjuvant chemotherapy. -Currently undergoing adjuvant RT, plan to complete 7/20 per Dr. MLisbeth Renshaw-Continue daily exemestane and every 3 week Herceptin to complete 1 year   2. CHF, DM with neuropathy, HTN, arthritis -Follow-up cardiology, PCP -Monitoring fluid status -Echo 07/31/2020 and 03/05/2021 was adequate to continue HER2 therapy  Disposition: Ms. SLoringappears stable. She continues daily RT, exemestane, and q3 weeks herceptin. She has skin toxicity from RT, she can increase percocet to 2 at night when needed for severe pain. I recommend she discuss topicals with Dr. MLisbeth Renshawtomorrow.   For past 2 weeks she has low appetite, fatigue, N/D, and dysuria. This is likely from her treatment. UA + for glucose and bacteria, culture is pending. We reviewed symptom management.   Labs  reviewed, CBC is stable. Ca 11.1. I recommend to hold calcium supplements and increase hydration. Will monitor.  Proceed with herceptin today, continue q3 weeks to complete 1 year, daily exemestane, and RT until 7/30. We reviewed her overall treatment plan and AI timeline, and I answered her questions.   Follow up with next herceptin in 3 weeks   Orders Placed This Encounter  Procedures   Urine Culture    Standing Status:   Future    Number of Occurrences:   1    Standing Expiration Date:   03/25/2022   Urinalysis, Complete w Microscopic    Standing Status:   Future    Number of Occurrences:   1    Standing  Expiration Date:   03/25/2022    All questions were answered. The patient knows to call the clinic with any problems, questions or concerns. No barriers to learning were detected.  Total encounter time was 30 minutes.     Alla Feeling, NP 03/25/21

## 2021-03-25 NOTE — Patient Instructions (Signed)
[facility]  Discharge Instructions: Thank you for choosing [facility] to provide your oncology and hematology care.   If you have a lab appointment with the Cancer Center, please go directly to the Cancer Center and check in at the registration area.   Wear comfortable clothing and clothing appropriate for easy access to any Portacath or PICC line.   We strive to give you quality time with your provider. You may need to reschedule your appointment if you arrive late (15 or more minutes).  Arriving late affects you and other patients whose appointments are after yours.  Also, if you miss three or more appointments without notifying the office, you may be dismissed from the clinic at the provider's discretion.      For prescription refill requests, have your pharmacy contact our office and allow 72 hours for refills to be completed.    Today you received the following chemotherapy and/or immunotherapy agents trastuzumab      To help prevent nausea and vomiting after your treatment, we encourage you to take your nausea medication as directed.  BELOW ARE SYMPTOMS THAT SHOULD BE REPORTED IMMEDIATELY: *FEVER GREATER THAN 100.4 F (38 C) OR HIGHER *CHILLS OR SWEATING *NAUSEA AND VOMITING THAT IS NOT CONTROLLED WITH YOUR NAUSEA MEDICATION *UNUSUAL SHORTNESS OF BREATH *UNUSUAL BRUISING OR BLEEDING *URINARY PROBLEMS (pain or burning when urinating, or frequent urination) *BOWEL PROBLEMS (unusual diarrhea, constipation, pain near the anus) TENDERNESS IN MOUTH AND THROAT WITH OR WITHOUT PRESENCE OF ULCERS (sore throat, sores in mouth, or a toothache) UNUSUAL RASH, SWELLING OR PAIN  UNUSUAL VAGINAL DISCHARGE OR ITCHING   Items with * indicate a potential emergency and should be followed up as soon as possible or go to the Emergency Department if any problems should occur.  Please show the CHEMOTHERAPY ALERT CARD or IMMUNOTHERAPY ALERT CARD at check-in to  the Emergency Department and triage nurse.  Should you have questions after your visit or need to cancel or reschedule your appointment, please contact [facility]  Dept: [phone]  and follow the prompts.  Office hours are 8:00 a.m. to 4:30 p.m. Monday - Friday. Please note that voicemails left after 4:00 p.m. may not be returned until the following business day.  We are closed weekends and major holidays. You have access to a nurse at all times for urgent questions. Please call the main number to the clinic Dept: [phone] and follow the prompts.   For any non-urgent questions, you may also contact your provider using MyChart. We now offer e-Visits for anyone 18 and older to request care online for non-urgent symptoms. For details visit [email or web address].   Also download the MyChart app! Go to the app store, search "MyChart", open the app, select [facility], and log in with your MyChart username and password.  Due to Covid, a mask is required upon entering the hospital/clinic. If you do not have a mask, one will be given to you upon arrival. For doctor visits, patients may have 1 support person aged 18 or older with them. For treatment visits, patients cannot have anyone with them due to current Covid guidelines and our immunocompromised population.   

## 2021-03-26 ENCOUNTER — Encounter (HOSPITAL_COMMUNITY): Payer: Self-pay

## 2021-03-26 ENCOUNTER — Emergency Department (HOSPITAL_COMMUNITY)
Admission: EM | Admit: 2021-03-26 | Discharge: 2021-03-26 | Disposition: A | Discharge status: Home / Self Care | Payer: Medicare HMO | Attending: Emergency Medicine | Admitting: Emergency Medicine

## 2021-03-26 ENCOUNTER — Ambulatory Visit
Admission: RE | Admit: 2021-03-26 | Discharge: 2021-03-26 | Disposition: A | Discharge status: Home / Self Care | Payer: Medicare HMO | Source: Ambulatory Visit | Attending: Radiation Oncology | Admitting: Radiation Oncology

## 2021-03-26 ENCOUNTER — Telehealth: Payer: Self-pay | Admitting: Hematology

## 2021-03-26 ENCOUNTER — Ambulatory Visit: Payer: Medicare HMO | Admitting: Radiation Oncology

## 2021-03-26 ENCOUNTER — Other Ambulatory Visit: Payer: Self-pay

## 2021-03-26 DIAGNOSIS — Z23 Encounter for immunization: Secondary | ICD-10-CM | POA: Diagnosis not present

## 2021-03-26 DIAGNOSIS — Z853 Personal history of malignant neoplasm of breast: Secondary | ICD-10-CM | POA: Diagnosis not present

## 2021-03-26 DIAGNOSIS — W268XXD Contact with other sharp object(s), not elsewhere classified, subsequent encounter: Secondary | ICD-10-CM | POA: Diagnosis not present

## 2021-03-26 DIAGNOSIS — Z7984 Long term (current) use of oral hypoglycemic drugs: Secondary | ICD-10-CM | POA: Insufficient documentation

## 2021-03-26 DIAGNOSIS — Z87891 Personal history of nicotine dependence: Secondary | ICD-10-CM | POA: Insufficient documentation

## 2021-03-26 DIAGNOSIS — Z5189 Encounter for other specified aftercare: Secondary | ICD-10-CM

## 2021-03-26 DIAGNOSIS — E1140 Type 2 diabetes mellitus with diabetic neuropathy, unspecified: Secondary | ICD-10-CM | POA: Insufficient documentation

## 2021-03-26 DIAGNOSIS — E1169 Type 2 diabetes mellitus with other specified complication: Secondary | ICD-10-CM | POA: Insufficient documentation

## 2021-03-26 DIAGNOSIS — S99922D Unspecified injury of left foot, subsequent encounter: Secondary | ICD-10-CM | POA: Diagnosis present

## 2021-03-26 DIAGNOSIS — Z794 Long term (current) use of insulin: Secondary | ICD-10-CM | POA: Diagnosis not present

## 2021-03-26 DIAGNOSIS — E669 Obesity, unspecified: Secondary | ICD-10-CM | POA: Diagnosis not present

## 2021-03-26 DIAGNOSIS — S91112D Laceration without foreign body of left great toe without damage to nail, subsequent encounter: Secondary | ICD-10-CM | POA: Diagnosis not present

## 2021-03-26 DIAGNOSIS — I110 Hypertensive heart disease with heart failure: Secondary | ICD-10-CM | POA: Insufficient documentation

## 2021-03-26 DIAGNOSIS — Z7982 Long term (current) use of aspirin: Secondary | ICD-10-CM | POA: Insufficient documentation

## 2021-03-26 DIAGNOSIS — I5031 Acute diastolic (congestive) heart failure: Secondary | ICD-10-CM | POA: Insufficient documentation

## 2021-03-26 MED ORDER — TETANUS-DIPHTH-ACELL PERTUSSIS 5-2.5-18.5 LF-MCG/0.5 IM SUSY
0.5000 mL | PREFILLED_SYRINGE | Freq: Once | INTRAMUSCULAR | Status: AC
  Administered 2021-03-26: 0.5 mL via INTRAMUSCULAR
  Filled 2021-03-26: qty 0.5

## 2021-03-26 MED ORDER — CEPHALEXIN 500 MG PO CAPS: 500.0000 mg | ORAL_CAPSULE | Freq: Two times a day (BID) | ORAL | 0 refills | Status: DC

## 2021-03-26 NOTE — ED Triage Notes (Addendum)
Patient states that she was cutting dead skin off of her left great toe with a toe nail clipper and cut the bottom of her left great toe this AM.

## 2021-03-26 NOTE — Discharge Instructions (Addendum)
You were given a prescription for antibiotics. Please take the antibiotic prescription fully.   Use bacitracin twice daily for one week  Please follow up with your primary care provider within 5-7 days for re-evaluation of your symptoms. lease return to the emergency department for any new or worsening symptoms.

## 2021-03-26 NOTE — ED Provider Notes (Signed)
[facility] DEPT Provider Note   CSN: [id number] Arrival date & time: 03/26/21  1005     History Chief Complaint  Patient presents with   Extremity Laceration    Cheryl Blankenship is a 70 y.o. female.  HPI  70 year old female with a history of anxiety, breast cancer, CHF, depression, diabetes, GERD, nephrolithiasis, hypertension, CVA, who presents to the emergency department today for evaluation of a wound to the left great toe.  She states she was cutting off dead skin off her left great toe this morning with toenail clippers and accidentally cut to her left great toe and started bleeding.  She reports a history of neuropathy so has decreased sensation to the left great toe.  She has not up-to-date on her Tdap.  She denies any other injuries.  Past Medical History:  Diagnosis Date   Anxiety    Breast cancer ([place])    CHF (congestive heart failure) ([place])    Depression    Diabetes mellitus without complication ([place])    type 2   GERD (gastroesophageal reflux disease)    History of kidney stones    Hypertension    PONV (postoperative nausea and vomiting)    Stroke [facility])     Patient Active Problem List   Diagnosis Date Noted   Invasive ductal carcinoma of breast, female, left ([place]) 12/30/2020   Chest pain 10/30/2020   Constipation 10/30/2020   Type 2 diabetes mellitus without complication ([place]) 23/76/2831   Exertional dyspnea 08/26/2020   Essential hypertension 51/76/1607   Acute diastolic CHF (congestive heart failure) ([place]) 08/26/2020   Obesity, diabetes, and hypertension syndrome ([place]) 08/26/2020   Malignant neoplasm of upper-outer quadrant of left breast in female, estrogen receptor positive ([place]) 07/20/2020    Past Surgical History:  Procedure Laterality Date   APPENDECTOMY     CHOLECYSTECTOMY     COLONOSCOPY     ESOPHAGOGASTRODUODENOSCOPY (EGD) WITH PROPOFOL N/A 11/01/2020   Procedure: ESOPHAGOGASTRODUODENOSCOPY (EGD) WITH PROPOFOL;   Surgeon: Arta Silence, MD;  Location: [facility] END[facility]COPY;  Service: Endoscopy;  Laterality: N/A;   EYE SURGERY     cataracts   MASTECTOMY WITH AXILLARY LYMPH NODE DISSECTION Left 12/30/2020   Procedure: LEFT MASTECTOMY WITH TARGETED AXILLARY LYMPH NODE DISSECTION;  Surgeon: [name], MD;  Location: [facility];  Service: General;  Laterality: Left;   MULTIPLE TOOTH EXTRACTIONS     PORTACATH PLACEMENT N/A 08/04/2020   Procedure: INSERTION PORT-A-CATH WITH ULTRASOUND GUIDANCE;  Surgeon: [name], MD;  Location: [facility];  Service: General;  Laterality: N/A;   STOMACH SURGERY       OB History   No obstetric history on file.     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 Authorizing Provider  cephALEXin (KEFLEX) 500 MG capsule Take 1 capsule (500 mg total) by mouth 2 (two) times daily for 7 days. 03/26/21 04/02/21 Yes Darey Hershberger S, PA-C  Accu-Chek FastClix Lancets MISC  08/14/20   [provider]  albuterol (VENTOLIN HFA) 108 (90 Base) MCG/ACT inhaler Inhale 1-2 puffs into the lungs every 4 (four) hours as needed for wheezing or shortness of breath. 09/01/20   Alla Feeling, NP  ALPRAZolam Duanne Moron) 0.25 MG tablet Take 1-2 tablets (0.25-0.5 mg total) by mouth 3 (three) times daily as needed for  anxiety or sleep. 02/26/21   [name], MD  amLODipine (NORVASC) 10 MG tablet Take 1 tablet (10 mg total) by mouth daily. Patient not taking: No sig reported 10/09/20 09/02/21  Geralynn Rile, MD  aspirin 81 MG chewable tablet Chew 81 mg by mouth daily.     [provider]  bisacodyl (DULCOLAX) 5 MG EC tablet Take 1 tablet (5 mg total) by mouth daily as needed for moderate constipation. Patient not taking: No sig reported 11/04/20   Antonieta Pert, MD  carvedilol (COREG) 25 MG tablet Take 1 tablet (25 mg total) by mouth 2 (two) times daily with a meal. 10/09/20 01/07/21  [name], [name], MD  Cholecalciferol 25 MCG (1000 UT) capsule Take 1,000 Units by mouth daily. 06/25/20   [provider]  diphenoxylate-atropine (LOMOTIL) 2.5-0.025 MG tablet TAKE 1 OR 2 TABLETS BY MOUTH 4 TIMES DAILY AS NEEDED FOR DIARRHEA OR LOOSE STOOLS 03/23/21   Alla Feeling, NP  empagliflozin (JARDIANCE) 10 MG TABS tablet Take 10 mg by mouth daily.    [provider]  exemestane (AROMASIN) 25 MG tablet Take 1 tablet (25 mg total) by mouth daily after breakfast. 03/25/21   Alla Feeling, NP  folic acid (FOLVITE) 1 MG tablet Take 1 mg by mouth daily. 07/14/20   [provider]  furosemide (LASIX) 20 MG tablet Take 40 mg by mouth daily.    [provider]  gabapentin (NEURONTIN) 400 MG capsule Take 400 mg by mouth 3 (three) times daily.  04/21/20   [provider]  glipiZIDE (GLUCOTROL XL) 5 MG 24 hr tablet Take 5 mg by mouth daily with breakfast. 06/24/20   [provider]  insulin aspart (NOVOLOG) 100 UNIT/ML injection Inject 2-10 Units into the skin 3 (three) times daily before meals. Per sliding scale:  150-199 = 2 units, 200-249 = 4 units, 250-299 = 6 units, 300-349 = 8 units, greater than350 = 10 units.    [provider]  insulin glargine (LANTUS SOLOSTAR) 100 UNIT/ML Solostar Pen Inject 35 Units into the skin 2 (two) times daily. Patient taking  differently: Inject 70 Units into the skin 2 (two) times daily. Pt takes 70 units in the morning and 70 units at night. 08/27/20   Desiree Hane, MD  lidocaine-prilocaine (EMLA) cream Apply 1 application topically as needed (pain). 01/20/21   Alla Feeling, NP  losartan (COZAAR) 100 MG tablet Take 1 tablet (100 mg total) by mouth daily. 10/09/20   Geralynn Rile, MD  metFORMIN (GLUCOPHAGE) 500 MG tablet Take 500 mg by mouth 2 (two) times daily with a meal.  06/25/20   [provider]  methocarbamol (ROBAXIN) 500 MG tablet Take 500  mg by mouth every 8 (eight) hours as needed for muscle spasms.    [provider]  Nutritional Supplements (FEEDING SUPPLEMENT, GLUCERNA 1.2 CAL,) LIQD Take 237 mLs by mouth 3 (three) times daily with meals.    [provider]  Omega-3 Fatty Acids (FISH OIL) 1000 MG CAPS Take 1,000 mg by mouth daily.    [provider]  omeprazole (PRILOSEC) 20 MG capsule Take 1 capsule (20 mg total) by mouth daily. 10/09/20   Geralynn Rile, MD  ondansetron (ZOFRAN-ODT) 4 MG disintegrating tablet Take 1 tablet (4 mg total) by mouth 2 (two) times daily as needed for up to 30 doses for nausea or vomiting. 10/26/20   Curatolo, Adam, DO  oxyCODONE-acetaminophen (PERCOCET) 5-325 MG tablet Take 1 tablet by mouth every 8 (eight) hours as needed for severe pain. 02/26/21 02/26/22  [name], MD  potassium chloride (KLOR-CON) 10 MEQ tablet Take 20 mEq by mouth daily.    [provider]  prochlorperazine (COMPAZINE) 10 MG tablet Take 1 tablet (10 mg total) by mouth every 6 (six) hours as needed for nausea or vomiting. 03/19/21   Tyler Pita, MD  promethazine (PHENERGAN) 25 MG suppository Place 1 suppository (25 mg total) rectally every 8 (eight) hours as needed for nausea or vomiting. Patient not taking: No sig reported 10/28/20   [name], MD  RESTASIS 0.05 % ophthalmic emulsion Place 1 drop into both eyes 2 (two) times daily as needed (dry  eyes). 07/13/20   [provider]  spironolactone (ALDACTONE) 25 MG tablet Take 1 tablet (25 mg total) by mouth daily. 10/09/20   [name], [name], MD  sucralfate (CARAFATE) 1 GM/10ML suspension Take 10 mLs (1 g total) by mouth 4 (four) times daily -  with meals and at bedtime. Patient not taking: No sig reported 11/04/20   Antonieta Pert, MD  vitamin B-12 (CYANOCOBALAMIN) 1000 MCG tablet Take 1,000 mcg by mouth daily.    [provider]  zolpidem (AMBIEN) 5 MG tablet Take 1 tablet (5 mg total) by mouth at bedtime as needed for sleep. 10/28/20   [name], MD    Allergies    Coconut (cocos nucifera) allergy skin test, Hydrochlorothiazide, Lisinopril, and Coconut oil  Review of Systems   Review of Systems  Constitutional:  Negative for fever.  Skin:  Positive for wound.   Physical Exam Updated Vital Signs BP 129/90 (BP Location: Left Arm)   Pulse 60   Temp 98 F (36.7 C) (Oral)   Resp 16   Ht 5\' 3"  (1.6 m)   Wt 95.2 kg   SpO2 97%   BMI 37.16 kg/m   Physical Exam Vitals and nursing note reviewed.  Constitutional:      General: She is not in acute distress.    Appearance: She is well-developed.  HENT:     Head: Normocephalic and atraumatic.  Eyes:     Conjunctiva/sclera: Conjunctivae normal.  Cardiovascular:     Rate and Rhythm: Normal rate.  Pulmonary:     Effort: Pulmonary effort is normal.  Musculoskeletal:        General: Normal range of motion.     Cervical back: Neck supple.     Comments: 0.5cm wound to the left great toe that is nonrepairable. No active bleeding  Skin:    General: Skin is warm and dry.  Neurological:     Mental Status: She is alert.    ED Results / Procedures / Treatments   Labs (all labs  ordered are listed, but only abnormal results are displayed) Labs Reviewed - No data to display  EKG None  Radiology No results found.  Procedures Procedures   Medications Ordered in ED Medications  Tdap (BOOSTRIX) injection 0.5  mL (has no administration in time range)    ED Course  I have reviewed the triage vital signs and the nursing notes.  Pertinent labs & imaging results that were available during my care of the patient were reviewed by me and considered in my medical decision making (see chart for details).    MDM Rules/Calculators/A&P                          70 year old female presents for evaluation of a wound to the left great toe.  Was using toenail clippers prior to arrival to trim away dead skin and accidentally caused a laceration to her left great toe.  On exam, patient has a very small wound to the left great toe that does not appear to be repairable.  She has no active bleeding on exam.  Due to her history of diabetes, wound care provided.  She was given prophylactic antibiotics and advised to use bacitracin and follow-up in 1 week for with her PCP.  She is given Tdap in the ED.  Advised on return precautions.  She voices understanding of the plan and reasons to return.  All questions answered.  Patient stable for discharge.  Final Clinical Impression(s) / ED Diagnoses Final diagnoses:  Visit for wound check    Rx / DC Orders ED Discharge Orders          Ordered    cephALEXin (KEFLEX) 500 MG capsule  2 times daily        03/26/21 [address], Zamariya Neal S, PA-C 03/26/21 1353    Milton Ferguson, MD 03/28/21 1659

## 2021-03-26 NOTE — Telephone Encounter (Signed)
Left message with follow-up appointments per 7/7 los. 

## 2021-03-28 LAB — URINE CULTURE: Culture: >=100000 — AB

## 2021-03-29 ENCOUNTER — Other Ambulatory Visit: Payer: Self-pay | Admitting: Cardiovascular Disease

## 2021-03-29 ENCOUNTER — Ambulatory Visit
Admission: RE | Admit: 2021-03-29 | Discharge: 2021-03-29 | Disposition: A | Discharge status: Home / Self Care | Payer: Medicare HMO | Source: Ambulatory Visit | Attending: Radiation Oncology | Admitting: Radiation Oncology

## 2021-03-29 ENCOUNTER — Other Ambulatory Visit: Payer: Self-pay

## 2021-03-29 DIAGNOSIS — Z510 Encounter for antineoplastic radiation therapy: Secondary | ICD-10-CM | POA: Diagnosis not present

## 2021-03-30 ENCOUNTER — Ambulatory Visit: Payer: Medicare HMO | Admitting: Radiation Oncology

## 2021-03-30 ENCOUNTER — Ambulatory Visit
Admission: RE | Admit: 2021-03-30 | Discharge: 2021-03-30 | Disposition: A | Discharge status: Home / Self Care | Payer: Medicare HMO | Source: Ambulatory Visit | Attending: Radiation Oncology | Admitting: Radiation Oncology

## 2021-03-30 DIAGNOSIS — Z510 Encounter for antineoplastic radiation therapy: Secondary | ICD-10-CM | POA: Diagnosis not present

## 2021-03-31 ENCOUNTER — Ambulatory Visit: Payer: Medicare HMO

## 2021-03-31 ENCOUNTER — Other Ambulatory Visit: Payer: Self-pay

## 2021-03-31 ENCOUNTER — Ambulatory Visit
Admission: RE | Admit: 2021-03-31 | Discharge: 2021-03-31 | Disposition: A | Discharge status: Home / Self Care | Payer: Medicare HMO | Source: Ambulatory Visit | Attending: Radiation Oncology | Admitting: Radiation Oncology

## 2021-03-31 DIAGNOSIS — Z510 Encounter for antineoplastic radiation therapy: Secondary | ICD-10-CM | POA: Diagnosis not present

## 2021-04-01 ENCOUNTER — Other Ambulatory Visit: Payer: Self-pay | Admitting: Nurse Practitioner

## 2021-04-01 ENCOUNTER — Ambulatory Visit: Payer: Medicare HMO

## 2021-04-01 ENCOUNTER — Telehealth: Payer: Self-pay | Admitting: *Deleted

## 2021-04-01 ENCOUNTER — Ambulatory Visit
Admission: RE | Admit: 2021-04-01 | Discharge: 2021-04-01 | Disposition: A | Discharge status: Home / Self Care | Payer: Medicare HMO | Source: Ambulatory Visit | Attending: Radiation Oncology | Admitting: Radiation Oncology

## 2021-04-01 DIAGNOSIS — Z510 Encounter for antineoplastic radiation therapy: Secondary | ICD-10-CM | POA: Diagnosis not present

## 2021-04-01 MED ORDER — CEPHALEXIN 500 MG PO CAPS: 500.0000 mg | ORAL_CAPSULE | Freq: Two times a day (BID) | ORAL | 0 refills | Status: AC

## 2021-04-01 NOTE — Telephone Encounter (Addendum)
-----   Message from Cheryl Feeling, NP sent at 04/01/2021 11:20 AM EDT ----- Can you please confirm she completed keflex for UTI and see how she's doing?  Contacted patient per directions from Ms. Burton. Ms. Cheryl Blankenship stated still has burning/painful urination. She did not take keflex noted as prescribed by PA in [facility] ED because she said pharmacy told her they did not get Rx. EPIC states escript received.   Contacted pharmacy, they said they did not get a prescription for patient.  Ms. Cheryl Blankenship informed - she escribed keflex. Confirmed receipt with pharmacy. Contacted patient to inform that pharmacy has prescription now and she stated she would contact them for delivery. Encouraged patient to continue to stay hydrated. Patient verbalized understanding.

## 2021-04-02 ENCOUNTER — Ambulatory Visit
Admission: RE | Admit: 2021-04-02 | Discharge: 2021-04-02 | Disposition: A | Discharge status: Home / Self Care | Payer: Medicare HMO | Source: Ambulatory Visit | Attending: Radiation Oncology | Admitting: Radiation Oncology

## 2021-04-02 ENCOUNTER — Ambulatory Visit: Payer: Medicare HMO

## 2021-04-02 ENCOUNTER — Other Ambulatory Visit: Payer: Self-pay

## 2021-04-02 DIAGNOSIS — Z170 Estrogen receptor positive status [ER+]: Secondary | ICD-10-CM

## 2021-04-02 DIAGNOSIS — Z510 Encounter for antineoplastic radiation therapy: Secondary | ICD-10-CM | POA: Diagnosis not present

## 2021-04-02 DIAGNOSIS — C50412 Malignant neoplasm of upper-outer quadrant of left female breast: Secondary | ICD-10-CM

## 2021-04-02 MED ORDER — ALRA NON-METALLIC DEODORANT (RAD-ONC)
1.0000 "application " | Freq: Once | TOPICAL | Status: AC
  Administered 2021-04-02: 1 via TOPICAL

## 2021-04-02 MED ORDER — RADIAPLEXRX EX GEL: Freq: Once | CUTANEOUS | Status: AC

## 2021-04-05 ENCOUNTER — Ambulatory Visit: Payer: Medicare HMO

## 2021-04-05 ENCOUNTER — Encounter: Payer: Self-pay | Admitting: *Deleted

## 2021-04-06 ENCOUNTER — Ambulatory Visit: Payer: Medicare HMO

## 2021-04-06 ENCOUNTER — Ambulatory Visit
Admission: RE | Admit: 2021-04-06 | Discharge: 2021-04-06 | Disposition: A | Discharge status: Home / Self Care | Payer: Medicare HMO | Source: Ambulatory Visit | Attending: Radiation Oncology | Admitting: Radiation Oncology

## 2021-04-06 DIAGNOSIS — Z510 Encounter for antineoplastic radiation therapy: Secondary | ICD-10-CM | POA: Diagnosis not present

## 2021-04-07 ENCOUNTER — Ambulatory Visit: Payer: Medicare HMO

## 2021-04-07 ENCOUNTER — Other Ambulatory Visit: Payer: Self-pay

## 2021-04-07 ENCOUNTER — Ambulatory Visit
Admission: RE | Admit: 2021-04-07 | Discharge: 2021-04-07 | Disposition: A | Discharge status: Home / Self Care | Payer: Medicare HMO | Source: Ambulatory Visit | Attending: Radiation Oncology | Admitting: Radiation Oncology

## 2021-04-07 DIAGNOSIS — Z510 Encounter for antineoplastic radiation therapy: Secondary | ICD-10-CM | POA: Diagnosis not present

## 2021-04-08 ENCOUNTER — Ambulatory Visit: Payer: Medicare HMO

## 2021-04-08 ENCOUNTER — Ambulatory Visit
Admission: RE | Admit: 2021-04-08 | Discharge: 2021-04-08 | Disposition: A | Discharge status: Home / Self Care | Payer: Medicare HMO | Source: Ambulatory Visit | Attending: Radiation Oncology | Admitting: Radiation Oncology

## 2021-04-08 ENCOUNTER — Encounter: Payer: Self-pay | Admitting: Radiation Oncology

## 2021-04-08 DIAGNOSIS — Z510 Encounter for antineoplastic radiation therapy: Secondary | ICD-10-CM | POA: Diagnosis not present

## 2021-04-09 ENCOUNTER — Encounter (INDEPENDENT_AMBULATORY_CARE_PROVIDER_SITE_OTHER): Payer: Medicare HMO | Admitting: Ophthalmology

## 2021-04-11 ENCOUNTER — Other Ambulatory Visit: Payer: Self-pay | Admitting: Hematology

## 2021-04-14 ENCOUNTER — Encounter: Payer: Self-pay | Admitting: Hematology

## 2021-04-14 ENCOUNTER — Encounter (INDEPENDENT_AMBULATORY_CARE_PROVIDER_SITE_OTHER): Payer: Medicare HMO | Admitting: Ophthalmology

## 2021-04-14 DIAGNOSIS — Z961 Presence of intraocular lens: Secondary | ICD-10-CM

## 2021-04-14 DIAGNOSIS — I10 Essential (primary) hypertension: Secondary | ICD-10-CM

## 2021-04-14 DIAGNOSIS — H35033 Hypertensive retinopathy, bilateral: Secondary | ICD-10-CM

## 2021-04-14 DIAGNOSIS — E113513 Type 2 diabetes mellitus with proliferative diabetic retinopathy with macular edema, bilateral: Secondary | ICD-10-CM

## 2021-04-14 DIAGNOSIS — H3581 Retinal edema: Secondary | ICD-10-CM

## 2021-04-14 NOTE — Progress Notes (Signed)
                                                                                                                                                             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 She did have difficulty with her skin in the treatment field developing dermatitis. She also has some limited range of motion and was working with PT during therapy.  Plan: The patient will receive a call in about one month from the radiation oncology department. She will continue follow up with Dr. Burr Medico as well.   ________________________________________________    Carola Rhine, [facility]

## 2021-04-16 ENCOUNTER — Inpatient Hospital Stay: Payer: Medicare HMO

## 2021-04-16 ENCOUNTER — Inpatient Hospital Stay (HOSPITAL_BASED_OUTPATIENT_CLINIC_OR_DEPARTMENT_OTHER): Payer: Medicare HMO | Admitting: Hematology

## 2021-04-16 ENCOUNTER — Other Ambulatory Visit: Payer: Self-pay

## 2021-04-16 VITALS — BP 143/71 | HR 65 | Temp 98.7°F | Resp 17 | Wt 211.4 lb

## 2021-04-16 DIAGNOSIS — C50412 Malignant neoplasm of upper-outer quadrant of left female breast: Secondary | ICD-10-CM | POA: Diagnosis not present

## 2021-04-16 DIAGNOSIS — Z170 Estrogen receptor positive status [ER+]: Secondary | ICD-10-CM

## 2021-04-16 DIAGNOSIS — Z95828 Presence of other vascular implants and grafts: Secondary | ICD-10-CM

## 2021-04-16 DIAGNOSIS — Z5112 Encounter for antineoplastic immunotherapy: Secondary | ICD-10-CM | POA: Diagnosis not present

## 2021-04-16 DIAGNOSIS — E2839 Other primary ovarian failure: Secondary | ICD-10-CM | POA: Diagnosis not present

## 2021-04-16 LAB — CBC WITH DIFFERENTIAL (CANCER CENTER ONLY)
Abs Immature Granulocytes: 0.02 10*3/uL (ref 0.00–0.07)
Basophils Absolute: 0 10*3/uL (ref 0.0–0.1)
Basophils Relative: 1 %
Eosinophils Absolute: 0.2 10*3/uL (ref 0.0–0.5)
Eosinophils Relative: 3 %
HCT: 31.6 % — ABNORMAL LOW (ref 36.0–46.0)
Hemoglobin: 10.6 g/dL — ABNORMAL LOW (ref 12.0–15.0)
Immature Granulocytes: 1 %
Lymphocytes Relative: 20 %
Lymphs Abs: 0.9 10*3/uL (ref 0.7–4.0)
MCH: 28.7 pg (ref 26.0–34.0)
MCHC: 33.5 g/dL (ref 30.0–36.0)
MCV: 85.6 fL (ref 80.0–100.0)
Monocytes Absolute: 0.4 10*3/uL (ref 0.1–1.0)
Monocytes Relative: 8 %
Neutro Abs: 2.9 10*3/uL (ref 1.7–7.7)
Neutrophils Relative %: 67 %
Platelet Count: 242 10*3/uL (ref 150–400)
RBC: 3.69 MIL/uL — ABNORMAL LOW (ref 3.87–5.11)
RDW: 13 % (ref 11.5–15.5)
WBC Count: 4.4 10*3/uL (ref 4.0–10.5)
nRBC: 0 % (ref 0.0–0.2)

## 2021-04-16 LAB — CMP (CANCER CENTER ONLY)
ALT: 12 U/L (ref 0–44)
AST: 14 U/L — ABNORMAL LOW (ref 15–41)
Albumin: 3.6 g/dL (ref 3.5–5.0)
Alkaline Phosphatase: 63 U/L (ref 38–126)
Anion gap: 6 (ref 5–15)
BUN: 18 mg/dL (ref 8–23)
CO2: 27 mmol/L (ref 22–32)
Calcium: 10.5 mg/dL — ABNORMAL HIGH (ref 8.9–10.3)
Chloride: 108 mmol/L (ref 98–111)
Creatinine: 1.05 mg/dL — ABNORMAL HIGH (ref 0.44–1.00)
GFR, Estimated: 58 mL/min — ABNORMAL LOW (ref >60)
Glucose, Bld: 141 mg/dL — ABNORMAL HIGH (ref 70–99)
Potassium: 4.1 mmol/L (ref 3.5–5.1)
Sodium: 141 mmol/L (ref 135–145)
Total Bilirubin: 0.4 mg/dL (ref 0.3–1.2)
Total Protein: 7 g/dL (ref 6.5–8.1)

## 2021-04-16 MED ORDER — SODIUM CHLORIDE 0.9% FLUSH
10.0000 mL | INTRAVENOUS | Status: DC | PRN
  Administered 2021-04-16: 10 mL
  Filled 2021-04-16: qty 10

## 2021-04-16 MED ORDER — ACETAMINOPHEN 325 MG PO TABS
650.0000 mg | ORAL_TABLET | Freq: Once | ORAL | Status: AC
  Administered 2021-04-16: 650 mg via ORAL

## 2021-04-16 MED ORDER — SODIUM CHLORIDE 0.9% FLUSH
10.0000 mL | INTRAVENOUS | Status: AC | PRN
  Administered 2021-04-16: 10 mL
  Filled 2021-04-16: qty 10

## 2021-04-16 MED ORDER — HEPARIN SOD (PORK) LOCK FLUSH 100 UNIT/ML IV SOLN
500.0000 [IU] | Freq: Once | INTRAVENOUS | Status: AC | PRN
  Administered 2021-04-16: 500 [IU]
  Filled 2021-04-16: qty 5

## 2021-04-16 MED ORDER — ACETAMINOPHEN 325 MG PO TABS
ORAL_TABLET | ORAL | Status: AC
  Filled 2021-04-16: qty 2

## 2021-04-16 MED ORDER — DIPHENHYDRAMINE HCL 25 MG PO CAPS
50.0000 mg | ORAL_CAPSULE | Freq: Once | ORAL | Status: AC
  Administered 2021-04-16: 50 mg via ORAL

## 2021-04-16 MED ORDER — SODIUM CHLORIDE 0.9 % IV SOLN
Freq: Once | INTRAVENOUS | Status: AC
  Filled 2021-04-16: qty 250

## 2021-04-16 MED ORDER — TRASTUZUMAB-ANNS CHEMO 150 MG IV SOLR
6.0000 mg/kg | Freq: Once | INTRAVENOUS | Status: AC
  Administered 2021-04-16: 567 mg via INTRAVENOUS
  Filled 2021-04-16: qty 27

## 2021-04-16 MED ORDER — DIPHENHYDRAMINE HCL 25 MG PO CAPS
ORAL_CAPSULE | ORAL | Status: AC
  Filled 2021-04-16: qty 2

## 2021-04-16 NOTE — Patient Instructions (Signed)
[facility]  Discharge Instructions: ?Thank you for choosing [facility] to provide your oncology and hematology care.  ? ?If you have a lab appointment with the Cancer Center, please go directly to the Cancer Center and check in at the registration area. ?  ?Wear comfortable clothing and clothing appropriate for easy access to any Portacath or PICC line.  ? ?We strive to give you quality time with your provider. You may need to reschedule your appointment if you arrive late (15 or more minutes).  Arriving late affects you and other patients whose appointments are after yours.  Also, if you miss three or more appointments without notifying the office, you may be dismissed from the clinic at the provider?s discretion.    ?  ?For prescription refill requests, have your pharmacy contact our office and allow 72 hours for refills to be completed.   ? ?Today you received the following chemotherapy and/or immunotherapy agents: Trastuzumab    ?  ?To help prevent nausea and vomiting after your treatment, we encourage you to take your nausea medication as directed. ? ?BELOW ARE SYMPTOMS THAT SHOULD BE REPORTED IMMEDIATELY: ?*FEVER GREATER THAN 100.4 F (38 ?C) OR HIGHER ?*CHILLS OR SWEATING ?*NAUSEA AND VOMITING THAT IS NOT CONTROLLED WITH YOUR NAUSEA MEDICATION ?*UNUSUAL SHORTNESS OF BREATH ?*UNUSUAL BRUISING OR BLEEDING ?*URINARY PROBLEMS (pain or burning when urinating, or frequent urination) ?*BOWEL PROBLEMS (unusual diarrhea, constipation, pain near the anus) ?TENDERNESS IN MOUTH AND THROAT WITH OR WITHOUT PRESENCE OF ULCERS (sore throat, sores in mouth, or a toothache) ?UNUSUAL RASH, SWELLING OR PAIN  ?UNUSUAL VAGINAL DISCHARGE OR ITCHING  ? ?Items with * indicate a potential emergency and should be followed up as soon as possible or go to the Emergency Department if any problems should occur. ? ?Please show the CHEMOTHERAPY ALERT CARD or IMMUNOTHERAPY ALERT CARD at check-in  to the Emergency Department and triage nurse. ? ?Should you have questions after your visit or need to cancel or reschedule your appointment, please contact [facility]  Dept: [phone]  and follow the prompts.  Office hours are 8:00 a.m. to 4:30 p.m. Monday - Friday. Please note that voicemails left after 4:00 p.m. may not be returned until the following business day.  We are closed weekends and major holidays. You have access to a nurse at all times for urgent questions. Please call the main number to the clinic Dept: [phone] and follow the prompts. ? ? ?For any non-urgent questions, you may also contact your provider using MyChart. We now offer e-Visits for anyone 18 and older to request care online for non-urgent symptoms. For details visit [email or web address]. ?  ?Also download the MyChart app! Go to the app store, search "MyChart", open the app, select [place], and log in with your MyChart username and password. ? ?Due to Covid, a mask is required upon entering the hospital/clinic. If you do not have a mask, one will be given to you upon arrival. For doctor visits, patients may have 1 support person aged 18 or older with them. For treatment visits, patients cannot have anyone with them due to current Covid guidelines and our immunocompromised population.  ? ?

## 2021-04-16 NOTE — Progress Notes (Signed)
Cheryl Blankenship   [phone] [phone]   Clinic Follow up Note   Patient Care Team: [name], PA-C as PCP - General (Internal Medicine) [name], RN as Oncology Nurse Navigator [name], RN as Oncology Nurse Navigator [name], MD as Consulting Physician (General Surgery) [name], MD as Consulting Physician (Hematology) [name], MD as Consulting Physician (Radiation Oncology) [name], [name], MD as Consulting Physician (Cardiology)  Date of Service:  04/16/2021  CHIEF COMPLAINT: f/u of left breast cancer  SUMMARY OF ONCOLOGIC HISTORY: Oncology History Overview Note  Cancer Staging Malignant neoplasm of upper-outer quadrant of left breast in female, estrogen receptor positive ([place]) Staging form: Breast, AJCC 8th Edition - Clinical stage from 07/16/2020: Stage IB (cT2, cN1, cM0, G2, ER+, PR+, HER2+) - Signed by [name], MD on 07/21/2020    Malignant neoplasm of upper-outer quadrant of left breast in female, estrogen receptor positive ([place])  07/01/2020 Mammogram   IMPRESSION: 1. There is a 12 cm span of highly suspicious calcifications involving the upper outer and upper inner quadrants of the left breast. On ultrasound, within this area of calcifications, there are confluent masses at 1 o'clock spanning at least 4.5 cm. An additional suspicious mass is seen in the left breast at 2 o'clock.  -Ultrasound targeted to the left breast at 1 o'clock, 5 cm from the nipple demonstrates a broad area of hypoechoic irregular tissue/confluent masses spanning at least 4.5 cm. There is a separate oval hypoechoic mass with indistinct margins at 2 o'clock, 7 cm from the nipple measuring 0.9 x 0.4 x 0.9 cm. Ultrasound of the left axilla demonstrates 2 abnormally thickened lymph nodes with cortices measuring 6-7 mm. There is an additional lymph node with a borderline cortex of 4 mm.   2. There are 2 lymph nodes  with thickened cortices in the left axilla, and an additional lymph node with a borderline thickened cortex.   07/16/2020 Cancer Staging   Staging form: Breast, AJCC 8th Edition - Clinical stage from 07/16/2020: Stage IIA (cT3, cN1, cM0, G2, ER+, PR+, HER2+) - Signed by [name], MD on 08/05/2020    07/16/2020 Initial Biopsy   Diagnosis 1. 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 Breast, left, needle core biopsy, 1 o'clock - INVASIVE DUCTAL CARCINOMA - SEE COMMENT 3. Lymph node, biopsy - METASTATIC CARCINOMA INVOLVING A LYMPH NODE - SEE COMMENT Microscopic Comment 1. Based on the biopsy, the ductal carcinoma in situ has a comedo pattern, high nuclear grade and measures 0.7 cm in greatest linear extent. Prognostic markers (ER/PR) are pending and will be reported in an addendum. 2. Based on the biopsy, the carcinoma appears Nottingham grade 2 of 3 and measures 1.5 cm in greatest linear extent. Prognostic markers (ER/PR/ki-67/HER2) are pending and will be reported in an addendum. Dr. Saralyn Blankenship reviewed the case and agrees with the above diagnosis. These results were called to [facility] on July 17, 2020. 3. Based on the biopsy, the carcinoma appears measures 1.0 cm in greatest linear extent. Prognostic markers (ER/PR/ki-67/HER2) are pending and will be reported in an addendum.   07/16/2020 Receptors her2   1. PROGNOSTIC INDICATORS Results: IMMUNOHISTOCHEMICAL AND MORPHOMETRIC ANALYSIS PERFORMED MANUALLY Estrogen Receptor: 30%, POSITIVE, STRONG STAINING INTENSITY Progesterone Receptor: 5%, POSITIVE, STRONG STAINING INTENSITY  2. PROGNOSTIC INDICATORS Results: IMMUNOHISTOCHEMICAL AND MORPHOMETRIC ANALYSIS PERFORMED MANUALLY The tumor cells are POSITIVE  for Her2 (3+). Estrogen Receptor: 85%, POSITIVE, STRONG STAINING INTENSITY Progesterone Receptor:  60%, POSITIVE, STRONG STAINING INTENSITY Proliferation Marker Ki67: 25%  3. PROGNOSTIC INDICATORS Results: IMMUNOHISTOCHEMICAL AND MORPHOMETRIC ANALYSIS PERFORMED MANUALLY The tumor cells are POSITIVE for Her2 (3+). Estrogen Receptor: 95%, POSITIVE, STRONG STAINING INTENSITY Progesterone Receptor: 45%, POSITIVE, STRONG STAINING INTENSITY Proliferation Marker Ki67: 30%   07/20/2020 Initial Diagnosis   Malignant neoplasm of upper-outer quadrant of left breast in female, estrogen receptor positive ([place])    07/31/2020 Echocardiogram   Baseline Echo  IMPRESSIONS     1. Left ventricular ejection fraction, by estimation, is 65 to 70%. The  left ventricle has normal function. The left ventricle has no regional  wall motion abnormalities. Left ventricular diastolic parameters are  indeterminate. The average left  ventricular global longitudinal strain is -17.1 %.   2. Right ventricular systolic function is normal. The right ventricular  size is normal. There is normal pulmonary artery systolic pressure.   3. The mitral valve is normal in structure. Trivial mitral valve  regurgitation. No evidence of mitral stenosis.   4. The aortic valve is normal in structure. Aortic valve regurgitation is  not visualized. No aortic stenosis is present.    07/31/2020 Breast MRI   IMPRESSION: 1. Extensive non mass enhancement within the left breast extending from the nipple to the chest wall. Overall findings are most compatible with extensive malignancy involving the majority of the left breast. 2. Indeterminate enhancing mass within the upper inner right breast. 3. Three morphologically abnormal lymph nodes within the left axilla. One of these nodes has been biopsied compatible with metastatic adenopathy.     08/03/2020 Imaging   Bone Scan whole body  IMPRESSION: Increased radiotracer uptake in the mid to lower lumbar regions of uncertain etiology. Correlation with radiography to assess  for potential arthropathy in these areas advised. Increased uptake in major joints is likely of arthropathic etiology. Increased uptake in the mid face is likely due to paranasal sinus disease. Distribution of radiotracer uptake in bony structures elsewhere unremarkable.   08/04/2020 Procedure   PAC placement by Dr [name]   08/05/2020 - 10/28/2020 Neo-Adjuvant Chemotherapy   Neoadjuvant THP q3weeks for 4-6 cycles starting 08/05/20, followed by Herceptin or Kadcyla q3weeks to complete 1 year of treatment. Stopped THP after 1 dose due to poor toleration       -I switched her to [place] starting 08/25/20. Stopped after C2 due to poor toleration (10/28/20)   08/07/2020 Imaging   CT CAP  IMPRESSION: 2.7 cm mass in the upper outer left breast, likely corresponding to the patient's known primary breast neoplasm.   8 mm short axis left axillary node, suspicious for nodal metastasis in this clinical context.   No findings suspicious for distant metastasis.   Endometrium is mildly prominent, measuring 11 mm. Correlate for vaginal bleeding and consider pelvic ultrasound for further evaluation if clinically warranted.   08/11/2020 Pathology Results   Diagnosis Breast, right, needle core biopsy, right breast - PAPILLARY LESION - SEE COMMENT Microscopic Comment The biopsy consists of a papillary lesion with a focal area of adenosis. Immunohistochemistry (SMM, calponin and p63) shows retention of the myoepithelial cell layer in the focus of adenosis. 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 These results were called to [facility] on August 12, 2020.   10/27/2020 Imaging   CT Angio chest  IMPRESSION: Slightly suboptimal opacification of the main pulmonary artery, however no central or proximal segmental pulmonary  embolism   Minimal bibasilar atelectasis   Aortic Atherosclerosis (ICD10-I70.0).   10/31/2020 Imaging   CT AP  IMPRESSION: 1.  No acute finding by CT other than a large amount of stool and gas in the right colon and gas in the transverse colon. No small bowel obstruction. 2. Previous cholecystectomy. 3. Aortic atherosclerosis. 4. Umbilical hernia containing only fat. 5. Small amount of fluid in the endometrial canal as seen previously.   Aortic Atherosclerosis (ICD10-I70.0).   12/04/2020 Imaging   MRI Breast IMPRESSION: 1. Evidence of a positive response to interval treatment. There has been a significant improvement of both the extent and strength of the enhancement throughout the outer LEFT breast when compared to the earlier MRI of 07/31/2020. 2. However, there remains scattered areas of mass and non-mass enhancement throughout the outer LEFT breast, involving the upper outer quadrant and lower outer quadrant, as detailed above. 3. The 3 morphologically abnormal lymph nodes in the LEFT axilla identified on previous MRI, 1 a biopsy-proven metastasis, are all slightly smaller indicating an additional positive response to interval treatment. 4. No evidence of malignancy within the RIGHT breast.   12/10/2020 -  Chemotherapy    Patient is on Treatment Plan: BREAST TRASTUZUMAB Q21D       12/30/2020 Surgery   A. BREAST, LEFT, MASTECTOMY:  - Focal residual invasive ductal carcinoma status post neoadjuvant  treatment.  - Residual ductal carcinoma in situ.  - Margins of resection are not involved.  - Biopsy clip (X2).   B. LYMPH NODES, LEFT #1, DISSECTION:  - Three lymph nodes, negative for carcinoma (0/3).  - Biopsy clip (X1).   C. LYMPH NODES, LEFT #2, DISSECTION:  - Two lymph nodes, negative for carcinoma (0/2).   PROGNOSTIC INDICATOR RESULTS:  - The tumor cells are POSITIVE for Her2 (3+).  - Estrogen Receptor:       POSITIVE, 90%, STRONG STAINING  - Progesterone Receptor:   POSITIVE, 50%, MODERATE STAINING    12/30/2020 Cancer Staging   Staging form: Breast, AJCC 8th Edition - Pathologic stage  from 12/30/2020: No Stage Recommended (ypT1a, pN0, cM0, G2, ER+, PR+, HER2+) - Signed by [name], MD on 01/20/2021  Stage prefix: Post-therapy  Histologic grading system: 3 grade system  Residual tumor (R): R0 - None    02/17/2021 - 04/08/2021 Radiation Therapy    Site Technique Total Dose (Gy) Dose per Fx (Gy) Completed Fx Beam Energies  Chest Wall, Left: CW_Lt 3D 50.4/50.4 1.8 28/28 10X  Chest Wall, Left: CW_Lt_SCLV 3D 50.4/50.4 1.8 28/28 6X, 15X  Chest Wall, Left: CW_Lt_Bst Electron 10/10 2 5/5 9E       CURRENT THERAPY:  -Herceptin, every 3 weeks -Perjeta will be added 05/07/20 -Exemestane daily  INTERVAL HISTORY:  Cheryl Blankenship is here for a follow up of breast cancer. She was last seen by me on 01/21/21 and by NP Lacie in the interim. She presents to the clinic alone. She reports pain and peeling from radiation therapy. She reports continued nausea, but her bowel movements are now regular. She reports muscle cramps in her legs.  All other systems were reviewed with the patient and are negative.  MEDICAL HISTORY:  Past Medical History:  Diagnosis Date   Anxiety    Breast cancer ([place])    CHF (congestive heart failure) ([place])    Depression    Diabetes mellitus without complication ([place])    type 2   GERD (gastroesophageal reflux disease)    History of kidney stones  Hypertension    PONV (postoperative nausea and vomiting)    Stroke [facility])     SURGICAL HISTORY: Past Surgical History:  Procedure Laterality Date   APPENDECTOMY     CHOLECYSTECTOMY     COLONOSCOPY     ESOPHAGOGASTRODUODENOSCOPY (EGD) WITH PROPOFOL N/A 11/01/2020   Procedure: ESOPHAGOGASTRODUODENOSCOPY (EGD) WITH PROPOFOL;  Surgeon: Arta Silence, MD;  Location: [facility];  Service: Endoscopy;  Laterality: N/A;   EYE SURGERY     cataracts   MASTECTOMY WITH AXILLARY LYMPH NODE DISSECTION Left 12/30/2020   Procedure: LEFT MASTECTOMY WITH TARGETED AXILLARY LYMPH NODE DISSECTION;  Surgeon: [name], MD;  Location: [place];  Service: General;  Laterality: Left;   MULTIPLE TOOTH EXTRACTIONS     PORTACATH PLACEMENT N/A 08/04/2020   Procedure: INSERTION PORT-A-CATH WITH ULTRASOUND GUIDANCE;  Surgeon: [name], MD;  Location: [place];  Service: General;  Laterality: N/A;   STOMACH SURGERY      I have reviewed the social history and family history with the patient and they are unchanged from previous note.  ALLERGIES:  is allergic to coconut (cocos nucifera) allergy skin test, hydrochlorothiazide, lisinopril, and coconut oil.  MEDICATIONS:  Current Outpatient Medications  Medication Sig Dispense Refill   Accu-Chek FastClix Lancets MISC      albuterol (VENTOLIN HFA) 108 (90 Base) MCG/ACT inhaler Inhale 1-2 puffs into the lungs every 4 (four) hours as needed for wheezing or shortness of breath. 8 g 0   ALPRAZolam (XANAX) 0.25 MG tablet Take 1-2 tablets (0.25-0.5 mg total) by mouth 3 (three) times daily as needed for anxiety or sleep. 30 tablet 0   amLODipine (NORVASC) 10 MG tablet Take 1 tablet (10 mg total) by mouth daily. (Patient not taking: No sig reported) 30 tablet 10   aspirin 81 MG chewable tablet Chew 81 mg by mouth daily.      bisacodyl (DULCOLAX) 5 MG EC tablet Take 1 tablet (5 mg total) by mouth daily as needed for moderate constipation. (Patient not taking: No sig reported) 30 tablet 0   carvedilol (COREG) 25 MG tablet Take 1 tablet (25 mg total) by mouth 2 (two) times daily with a meal. 180 tablet 3   Cholecalciferol 25 MCG (1000 UT) capsule Take 1,000 Units by mouth daily.     diphenoxylate-atropine (LOMOTIL) 2.5-0.025 MG tablet TAKE 1 OR 2 TABLETS BY MOUTH 4 TIMES DAILY AS NEEDED FOR DIARRHEA OR LOOSE STOOLS 60 tablet 2   empagliflozin (JARDIANCE) 10 MG TABS tablet Take 10 mg by mouth daily.     exemestane (AROMASIN) 25 MG tablet Take 1 tablet (25 mg total) by mouth daily after breakfast. 90 tablet 2   folic acid (FOLVITE) 1 MG tablet Take 1 mg by mouth daily.      furosemide (LASIX) 20 MG tablet Take 40 mg by mouth daily.     gabapentin (NEURONTIN) 400 MG capsule Take 400 mg by mouth 3 (three) times daily.      glipiZIDE (GLUCOTROL XL) 5 MG 24 hr tablet Take 5 mg by mouth daily with breakfast.     insulin aspart (NOVOLOG) 100 UNIT/ML injection Inject 2-10 Units into the skin 3 (three) times daily before meals. Per sliding scale:  150-199 = 2 units, 200-249 = 4 units, 250-299 = 6 units, 300-349 = 8 units, greater than350 = 10 units.     insulin glargine (LANTUS SOLOSTAR) 100 UNIT/ML Solostar Pen Inject 35 Units into the skin 2 (two) times daily. (Patient taking differently: Inject 70 Units into  the skin 2 (two) times daily. Pt takes 70 units in the morning and 70 units at night.) 15 mL 11   lidocaine-prilocaine (EMLA) cream Apply 1 application topically as needed (pain). 30 g 3   losartan (COZAAR) 100 MG tablet TAKE 1 TABLET BY MOUTH EVERY DAY 90 tablet 1   metFORMIN (GLUCOPHAGE) 500 MG tablet Take 500 mg by mouth 2 (two) times daily with a meal.      methocarbamol (ROBAXIN) 500 MG tablet Take 500 mg by mouth every 8 (eight) hours as needed for muscle spasms.     Nutritional Supplements (FEEDING SUPPLEMENT, GLUCERNA 1.2 CAL,) LIQD Take 237 mLs by mouth 3 (three) times daily with meals.     Omega-3 Fatty Acids (FISH OIL) 1000 MG CAPS Take 1,000 mg by mouth daily.     omeprazole (PRILOSEC) 20 MG capsule Take 1 capsule (20 mg total) by mouth daily. 30 capsule 11   ondansetron (ZOFRAN-ODT) 4 MG disintegrating tablet Take 1 tablet (4 mg total) by mouth 2 (two) times daily as needed for up to 30 doses for nausea or vomiting. 30 tablet 0   oxyCODONE-acetaminophen (PERCOCET) 5-325 MG tablet Take 1 tablet by mouth every 8 (eight) hours as needed for severe pain. 30 tablet 0   potassium chloride (KLOR-CON) 10 MEQ tablet Take 20 mEq by mouth daily.     prochlorperazine (COMPAZINE) 10 MG tablet Take 1 tablet (10 mg total) by mouth every 6 (six) hours as needed for nausea or  vomiting. 30 tablet 5   promethazine (PHENERGAN) 25 MG suppository Place 1 suppository (25 mg total) rectally every 8 (eight) hours as needed for nausea or vomiting. (Patient not taking: No sig reported) 30 each 1   RESTASIS 0.05 % ophthalmic emulsion Place 1 drop into both eyes 2 (two) times daily as needed (dry eyes).     spironolactone (ALDACTONE) 25 MG tablet TAKE 1 TABLET BY MOUTH EVERY DAY 90 tablet 1   sucralfate (CARAFATE) 1 GM/10ML suspension Take 10 mLs (1 g total) by mouth 4 (four) times daily -  with meals and at bedtime. (Patient not taking: No sig reported) 420 mL 0   vitamin B-12 (CYANOCOBALAMIN) 1000 MCG tablet Take 1,000 mcg by mouth daily.     zolpidem (AMBIEN) 5 MG tablet Take 1 tablet (5 mg total) by mouth at bedtime as needed for sleep. 20 tablet 0   No current facility-administered medications for this visit.    PHYSICAL EXAMINATION: ECOG PERFORMANCE STATUS: 2 - Symptomatic, <50% confined to bed  Vitals:   04/16/21 1435  BP: (!) 143/71  Pulse: 65  Resp: 17  Temp: 98.7 F (37.1 C)  SpO2: 100%   Filed Weights   04/16/21 1435  Weight: 211 lb 6.4 oz (95.9 kg)   Due to COVID19 we will limit examination to appearance. Patient had no complaints.  GENERAL:alert, no distress and comfortable SKIN: skin color normal, no rashes or significant lesions EYES: normal, Conjunctiva are pink and non-injected, sclera clear  NEURO: alert & oriented x 3 with fluent speech Breast/Chest Wall: She has some residual swelling, but the incision is healing well.  LABORATORY DATA:  I have reviewed the data as listed CBC Latest Ref Rng & Units 04/16/2021 03/25/2021 01/21/2021  WBC 4.0 - 10.5 K/uL 4.4 5.4 6.2  Hemoglobin 12.0 - 15.0 g/dL 10.6(L) 11.0(L) 11.1(L)  Hematocrit 36.0 - 46.0 % 31.6(L) 33.7(L) 34.6(L)  Platelets 150 - 400 K/uL 242 235 306     CMP Latest Ref Rng &  Units 04/16/2021 03/25/2021 01/21/2021  Glucose 70 - 99 mg/dL 141(H) 84 346(H)  BUN 8 - 23 mg/dL 18 17 26(H)   Creatinine 0.44 - 1.00 mg/dL 1.05(H) 0.89 1.25(H)  Sodium 135 - 145 mmol/L 141 143 139  Potassium 3.5 - 5.1 mmol/L 4.1 3.9 4.1  Chloride 98 - 111 mmol/L 108 108 100  CO2 22 - 32 mmol/L _0 Calcium 8.9 - 10.3 mg/dL 10.5(H) 11.1(H) 10.3  Total Protein 6.5 - 8.1 g/dL 7.0 7.2 6.9  Total Bilirubin 0.3 - 1.2 mg/dL 0.4 0.5 0.4  Alkaline Phos 38 - 126 U/L 63 71 72  AST 15 - 41 U/L 14(L) 16 12(L)  ALT 0 - 44 U/L _1 RADIOGRAPHIC STUDIES: I have personally reviewed the radiological images as listed and agreed with the findings in the report. No results found.   ASSESSMENT & PLAN:  Revia Nghiem is a 70 y.o. female with   1. Malignant neoplasm of upper-outer quadrant of left breast, Stage II, c(T2N1M0), ER+/PR+/HER2+, Grade II, ypT1aN0 -She was diagnosed in 06/2020. She has 12cm calcification and 4.5cm mass in her left breast with 2 abnormal LN. 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  -Workup showed no evidence of distant disease -She started neoadjuvant chemo THP q3weeks on 08/05/20. Due to poor toleration after C1, she was switched to [employer] on 09/04/20. C2 postponed due to COVID (+) in early 09/2020. Due to poor toleration of C2 Kadcyla, chemo discontinued after cycle 2.  -I recommend Herceptin every 3 weeks to complete one year therapy. Due to her previous diarrhea and relative small benefit of Perjeta, will restart Perjeta 04/16/21. -Exemestane started 12/10/20, she is tolerating well  -She underwent left mastectomy on 12/30/20 under Dr. [name]. Pathology showed: focal residual invasive ductal carcinoma s/p neoadjuvant treatment; residual DCIS; margins of resection not involved; all 5 lymph nodes negative (0/5).  -labs reviewed, adequate to proceed with Herceptin today and continue every 3 weeks to complete 1 year therapy -I discussed the benefit of adjuvant Perjeta again with  her.  She previously had a severe diarrhea with her first cycle of TCHP, and Perjeta has been held.  She is clinically doing much better, and agrees to add Perjeta in the adjuvant setting, will start with next cycle trastuzumab in 3 weeks, after insurance approval. -f/u in 6 weeks    2. Bone Health -She has not had DEXA done before. [facility] -I discussed that AI can decrease her bone density. We will monitor on exemestane.   3. Comorbidities: CHF, poorly controlled DM with neuropathy, HTN, Arthritis in hips -Due to Nausea and stomach pain from Jardiance, she takes Zofran as needed.  -she is on Cymbalta and Neurontin for diabetic neuropathy -Continue medications and f/u with PCP and Cardiologist Dr. Marisue Ivan.  -She had 2 falls in 2021 due to multiple medications and poor sleep. She ambulates with cane.  -Pt is on Lasix which she notes leads to urinary incontinence. She wears depends for this. -Her baseline ECHO from 07/31/20 was normal, repeat 03/05/21 was stable.   4. Genetic testing, she agreed to proceed but has not proceeded with testing yet.    5. Social Support  -She does have decreased memory, she is on medication. Her family help her with history.  -She lives in [place] alone in an apartment with other  older women. She is currently able to take care of herself independently. Her daughter is in town and her son is in [place]. She has other non-biological children. She lives with her husband     74. COVID (+) in early 09/2020. She was asymptomatic and did not require treatment. She tested negative in later 09/2020. She has recovered well      PLAN:  -proceed with trastuzumab today, and continue every 3 weeks -add Perjeta in 3 weeks -bone density ordered -labs and f/u in 6 weeks -continue exemestane   No problem-specific Assessment & Plan notes found for this encounter.   Orders Placed This Encounter  Procedures   DG Bone Density     Standing Status:   Future    Standing Expiration Date:   04/16/2022    Order Specific Question:   Reason for Exam (SYMPTOM  OR DIAGNOSIS REQUIRED)    Answer:   screening    Order Specific Question:   Preferred imaging location?    Answer:   [facility]    All questions were answered. The patient knows to call the clinic with any problems, questions or concerns. No barriers to learning was detected. The total time spent in the appointment was 30 minutes.     [name], MD 04/18/2021   I, Wilburn Mylar, am acting as scribe for [name], MD.   I have reviewed the above documentation for accuracy and completeness, and I agree with the above.

## 2021-04-16 NOTE — Progress Notes (Signed)
Received call from Dr Burr Medico to reload Perjeta dose at 840 mg for first dose since >6 weeks since last dose.  Perjeta 840 mg will also need to be infused over 60 minutes   Order updated.  T.O. Dr Lavonda Jumbo, PharmD 04/16/21 @ [phone]

## 2021-04-18 ENCOUNTER — Encounter: Payer: Self-pay | Admitting: Hematology

## 2021-04-19 ENCOUNTER — Telehealth: Payer: Self-pay | Admitting: Hematology

## 2021-04-19 NOTE — Telephone Encounter (Signed)
Scheduled follow-up appointment per 7/29 los. Patient is aware. 

## 2021-05-05 ENCOUNTER — Encounter (INDEPENDENT_AMBULATORY_CARE_PROVIDER_SITE_OTHER): Payer: Medicare HMO | Admitting: Ophthalmology

## 2021-05-05 NOTE — Progress Notes (Signed)
This nurse received a message from access nurse stating that this patients daughter called in and stated that patient has been having bad chest pains which lasted for approximately 2-3 hours. Also complains of pain in bilateral legs.  Denies Fever, SOB, Cough.  Patient ws advised to got to ED, when offered for EMS to be called for her.  Daughter refused and stated that she would have someone to take the patient.

## 2021-05-06 ENCOUNTER — Ambulatory Visit: Payer: Medicare HMO

## 2021-05-06 ENCOUNTER — Inpatient Hospital Stay: Payer: Medicare HMO

## 2021-05-06 ENCOUNTER — Other Ambulatory Visit: Payer: Medicare HMO

## 2021-05-06 ENCOUNTER — Other Ambulatory Visit: Payer: Self-pay | Admitting: Hematology

## 2021-05-06 ENCOUNTER — Other Ambulatory Visit: Payer: Self-pay

## 2021-05-06 ENCOUNTER — Encounter: Payer: Self-pay | Admitting: *Deleted

## 2021-05-06 ENCOUNTER — Inpatient Hospital Stay: Payer: Medicare HMO | Attending: Hematology

## 2021-05-06 VITALS — BP 170/91 | HR 74 | Temp 98.4°F | Resp 18 | Wt 206.0 lb

## 2021-05-06 DIAGNOSIS — Z5112 Encounter for antineoplastic immunotherapy: Secondary | ICD-10-CM | POA: Insufficient documentation

## 2021-05-06 DIAGNOSIS — Z95828 Presence of other vascular implants and grafts: Secondary | ICD-10-CM

## 2021-05-06 DIAGNOSIS — Z170 Estrogen receptor positive status [ER+]: Secondary | ICD-10-CM

## 2021-05-06 DIAGNOSIS — C50412 Malignant neoplasm of upper-outer quadrant of left female breast: Secondary | ICD-10-CM | POA: Diagnosis present

## 2021-05-06 LAB — CBC WITH DIFFERENTIAL (CANCER CENTER ONLY)
Abs Immature Granulocytes: 0.01 10*3/uL (ref 0.00–0.07)
Basophils Absolute: 0 10*3/uL (ref 0.0–0.1)
Basophils Relative: 0 %
Eosinophils Absolute: 0.1 10*3/uL (ref 0.0–0.5)
Eosinophils Relative: 3 %
HCT: 35.3 % — ABNORMAL LOW (ref 36.0–46.0)
Hemoglobin: 11.4 g/dL — ABNORMAL LOW (ref 12.0–15.0)
Immature Granulocytes: 0 %
Lymphocytes Relative: 20 %
Lymphs Abs: 0.9 10*3/uL (ref 0.7–4.0)
MCH: 28.4 pg (ref 26.0–34.0)
MCHC: 32.3 g/dL (ref 30.0–36.0)
MCV: 87.8 fL (ref 80.0–100.0)
Monocytes Absolute: 0.3 10*3/uL (ref 0.1–1.0)
Monocytes Relative: 7 %
Neutro Abs: 3.1 10*3/uL (ref 1.7–7.7)
Neutrophils Relative %: 70 %
Platelet Count: 265 10*3/uL (ref 150–400)
RBC: 4.02 MIL/uL (ref 3.87–5.11)
RDW: 12.9 % (ref 11.5–15.5)
WBC Count: 4.5 10*3/uL (ref 4.0–10.5)
nRBC: 0 % (ref 0.0–0.2)

## 2021-05-06 LAB — CMP (CANCER CENTER ONLY)
ALT: 16 U/L (ref 0–44)
AST: 17 U/L (ref 15–41)
Albumin: 3.7 g/dL (ref 3.5–5.0)
Alkaline Phosphatase: 75 U/L (ref 38–126)
Anion gap: 9 (ref 5–15)
BUN: 20 mg/dL (ref 8–23)
CO2: 29 mmol/L (ref 22–32)
Calcium: 11.3 mg/dL — ABNORMAL HIGH (ref 8.9–10.3)
Chloride: 104 mmol/L (ref 98–111)
Creatinine: 1.21 mg/dL — ABNORMAL HIGH (ref 0.44–1.00)
GFR, Estimated: 49 mL/min — ABNORMAL LOW (ref >60)
Glucose, Bld: 147 mg/dL — ABNORMAL HIGH (ref 70–99)
Potassium: 4.6 mmol/L (ref 3.5–5.1)
Sodium: 142 mmol/L (ref 135–145)
Total Bilirubin: 0.5 mg/dL (ref 0.3–1.2)
Total Protein: 7.5 g/dL (ref 6.5–8.1)

## 2021-05-06 MED ORDER — SODIUM CHLORIDE 0.9% FLUSH
10.0000 mL | INTRAVENOUS | Status: DC | PRN
  Administered 2021-05-06: 10 mL

## 2021-05-06 MED ORDER — SODIUM CHLORIDE 0.9% FLUSH
10.0000 mL | INTRAVENOUS | Status: AC | PRN
  Administered 2021-05-06: 10 mL

## 2021-05-06 MED ORDER — DIPHENHYDRAMINE HCL 25 MG PO CAPS
ORAL_CAPSULE | ORAL | Status: AC
  Filled 2021-05-06: qty 2

## 2021-05-06 MED ORDER — HEPARIN SOD (PORK) LOCK FLUSH 100 UNIT/ML IV SOLN
500.0000 [IU] | Freq: Once | INTRAVENOUS | Status: AC | PRN
  Administered 2021-05-06: 500 [IU]

## 2021-05-06 MED ORDER — SODIUM CHLORIDE 0.9 % IV SOLN: Freq: Once | INTRAVENOUS | Status: AC

## 2021-05-06 MED ORDER — ACETAMINOPHEN 325 MG PO TABS
650.0000 mg | ORAL_TABLET | Freq: Once | ORAL | Status: AC
  Administered 2021-05-06: 650 mg via ORAL

## 2021-05-06 MED ORDER — SODIUM CHLORIDE 0.9 % IV SOLN
6.0000 mg/kg | Freq: Once | INTRAVENOUS | Status: AC
  Administered 2021-05-06: 567 mg via INTRAVENOUS
  Filled 2021-05-06: qty 27

## 2021-05-06 MED ORDER — OXYCODONE HCL 5 MG PO TABS
5.0000 mg | ORAL_TABLET | Freq: Once | ORAL | Status: AC
  Administered 2021-05-06: 5 mg via ORAL

## 2021-05-06 MED ORDER — SODIUM CHLORIDE 0.9 % IV SOLN
840.0000 mg | Freq: Once | INTRAVENOUS | Status: AC
  Administered 2021-05-06: 840 mg via INTRAVENOUS
  Filled 2021-05-06: qty 28

## 2021-05-06 MED ORDER — ACETAMINOPHEN 325 MG PO TABS
ORAL_TABLET | ORAL | Status: AC
  Filled 2021-05-06: qty 2

## 2021-05-06 MED ORDER — DIPHENHYDRAMINE HCL 25 MG PO CAPS
50.0000 mg | ORAL_CAPSULE | Freq: Once | ORAL | Status: AC
Start: 2021-05-06 — End: 2021-05-06
  Administered 2021-05-06: 50 mg via ORAL

## 2021-05-06 MED ORDER — OXYCODONE HCL 5 MG PO TABS
ORAL_TABLET | ORAL | Status: AC
  Filled 2021-05-06: qty 1

## 2021-05-06 NOTE — Patient Instructions (Addendum)
[facility]  Discharge Instructions: Thank you for choosing [facility] to provide your oncology and hematology care.   If you have a lab appointment with the [facility], please go directly to the [facility] and check in at the registration area.   Wear comfortable clothing and clothing appropriate for easy access to any Portacath or PICC line.   We strive to give you quality time with your provider. You may need to reschedule your appointment if you arrive late (15 or more minutes).  Arriving late affects you and other patients whose appointments are after yours.  Also, if you miss three or more appointments without notifying the office, you may be dismissed from the clinic at the provider's discretion.      For prescription refill requests, have your pharmacy contact our office and allow 72 hours for refills to be completed.    Today you received the following chemotherapy and/or immunotherapy agents: Trastuzumab (Kanjinti) and Pertuzumab (Perjeta).   To help prevent nausea and vomiting after your treatment, we encourage you to take your nausea medication as directed.  BELOW ARE SYMPTOMS THAT SHOULD BE REPORTED IMMEDIATELY: *FEVER GREATER THAN 100.4 F (38 C) OR HIGHER *CHILLS OR SWEATING *NAUSEA AND VOMITING THAT IS NOT CONTROLLED WITH YOUR NAUSEA MEDICATION *UNUSUAL SHORTNESS OF BREATH *UNUSUAL BRUISING OR BLEEDING *URINARY PROBLEMS (pain or burning when urinating, or frequent urination) *BOWEL PROBLEMS (unusual diarrhea, constipation, pain near the anus) TENDERNESS IN MOUTH AND THROAT WITH OR WITHOUT PRESENCE OF ULCERS (sore throat, sores in mouth, or a toothache) UNUSUAL RASH, SWELLING OR PAIN  UNUSUAL VAGINAL DISCHARGE OR ITCHING   Items with * indicate a potential emergency and should be followed up as soon as possible or go to the Emergency Department if any problems should occur.  Please show the CHEMOTHERAPY ALERT CARD or  IMMUNOTHERAPY ALERT CARD at check-in to the Emergency Department and triage nurse.  Should you have questions after your visit or need to cancel or reschedule your appointment, please contact [facility]  Dept: [phone]  and follow the prompts.  Office hours are 8:00 a.m. to 4:30 p.m. Monday - Friday. Please note that voicemails left after 4:00 p.m. may not be returned until the following business day.  We are closed weekends and major holidays. You have access to a nurse at all times for urgent questions. Please call the main number to the clinic Dept: [phone] and follow the prompts.   For any non-urgent questions, you may also contact your provider using MyChart. We now offer e-Visits for anyone 46 and older to request care online for non-urgent symptoms. For details visit [email or web address].   Also download the MyChart app! Go to the app store, search "MyChart", open the app, select [facility], and log in with your MyChart username and password.  Due to Covid, a mask is required upon entering the hospital/clinic. If you do not have a mask, one will be given to you upon arrival. For doctor visits, patients may have 1 support person aged 37 or older with them. For treatment visits, patients cannot have anyone with them due to current Covid guidelines and our immunocompromised population.   Pertuzumab injection What is this medication? PERTUZUMAB (per TOOZ ue mab) is a monoclonal antibody. It is used to treatbreast cancer. This medicine may be used for other purposes; ask your health care provider orpharmacist if you have questions. COMMON BRAND NAME(S): PERJETA What should I tell my  care team before I take this medication? They need to know if you have any of these conditions: heart disease heart failure high blood pressure history of irregular heart beat recent or ongoing radiation therapy an unusual or allergic reaction to pertuzumab, other  medicines, foods, dyes, or preservatives pregnant or trying to get pregnant breast-feeding How should I use this medication? This medicine is for infusion into a vein. It is given by a health careprofessional in a hospital or clinic setting. Talk to your pediatrician regarding the use of this medicine in children.Special care may be needed. Overdosage: If you think you have taken too much of this medicine contact apoison control center or emergency room at once. NOTE: This medicine is only for you. Do not share this medicine with others. What if I miss a dose? It is important not to miss your dose. Call your doctor or health careprofessional if you are unable to keep an appointment. What may interact with this medication? Interactions are not expected. Give your health care provider a list of all the medicines, herbs, non-prescription drugs, or dietary supplements you use. Also tell them if you smoke, drink alcohol, or use illegal drugs. Some items may interact with yourmedicine. This list may not describe all possible interactions. Give your health care provider a list of all the medicines, herbs, non-prescription drugs, or dietary supplements you use. Also tell them if you smoke, drink alcohol, or use illegaldrugs. Some items may interact with your medicine. What should I watch for while using this medication? Your condition will be monitored carefully while you are receiving this medicine. Report any side effects. Continue your course of treatment eventhough you feel ill unless your doctor tells you to stop. Do not become pregnant while taking this medicine or for 7 months after stopping it. Women should inform their doctor if they wish to become pregnant or think they might be pregnant. Women of child-bearing potential will need to have a negative pregnancy test before starting this medicine. There is a potential for serious side effects to an unborn child. Talk to your health care professional  or pharmacist for more information. Do not breast-feed an infantwhile taking this medicine or for 7 months after stopping it. Women must use effective birth control with this medicine. Call your doctor or health care professional for advice if you get a fever, chills or sore throat, or other symptoms of a cold or flu. Do not treatyourself. Try to avoid being around people who are sick. You may experience fever, chills, and headache during the infusion. Report anyside effects during the infusion to your health care professional. What side effects may I notice from receiving this medication? Side effects that you should report to your doctor or health care professionalas soon as possible: breathing problems chest pain or palpitations dizziness feeling faint or lightheaded fever or chills skin rash, itching or hives sore throat swelling of the face, lips, or tongue swelling of the legs or ankles unusually weak or tired Side effects that usually do not require medical attention (report to yourdoctor or health care professional if they continue or are bothersome): diarrhea hair loss nausea, vomiting tiredness This list may not describe all possible side effects. Call your doctor for medical advice about side effects. You may report side effects to FDA at1-800-FDA-1088. Where should I keep my medication? This drug is given in a hospital or clinic and will not be stored at home. NOTE: This sheet is a summary. It may not cover  all possible information. If you have questions about this medicine, talk to your doctor, pharmacist, orhealth care provider.  2022 Elsevier/Gold Standard (2015-10-08 12:08:50)

## 2021-05-06 NOTE — Progress Notes (Signed)
Pt. here today for treatment. Denies chest pain, dizziness, and no shortness of breath noted. Pt. states pain in legs are better, but still having pain in right upper leg/thigh. Negative Homans sign. Heat applied and Tylenol po given. Dr. Burr Medico notified and in for observation/assessment. Medicated pt. with Oxycodone 5 mg po. Pt. BP elevated, pt. denies chest pain, dizziness, no shortness of breath noted. Per Dr. Burr Medico, ok for discharge. Treatment completed.

## 2021-05-07 ENCOUNTER — Encounter (INDEPENDENT_AMBULATORY_CARE_PROVIDER_SITE_OTHER): Payer: Medicare HMO | Admitting: Ophthalmology

## 2021-05-07 DIAGNOSIS — Z961 Presence of intraocular lens: Secondary | ICD-10-CM

## 2021-05-07 DIAGNOSIS — H35033 Hypertensive retinopathy, bilateral: Secondary | ICD-10-CM

## 2021-05-07 DIAGNOSIS — I10 Essential (primary) hypertension: Secondary | ICD-10-CM

## 2021-05-07 DIAGNOSIS — E113513 Type 2 diabetes mellitus with proliferative diabetic retinopathy with macular edema, bilateral: Secondary | ICD-10-CM

## 2021-05-07 DIAGNOSIS — H3581 Retinal edema: Secondary | ICD-10-CM

## 2021-05-17 ENCOUNTER — Ambulatory Visit
Admission: RE | Admit: 2021-05-17 | Discharge: 2021-05-17 | Disposition: A | Discharge status: Home / Self Care | Payer: Medicare HMO | Source: Ambulatory Visit | Attending: Radiation Oncology | Admitting: Radiation Oncology

## 2021-05-17 DIAGNOSIS — C50912 Malignant neoplasm of unspecified site of left female breast: Secondary | ICD-10-CM | POA: Insufficient documentation

## 2021-05-17 DIAGNOSIS — C50412 Malignant neoplasm of upper-outer quadrant of left female breast: Secondary | ICD-10-CM | POA: Insufficient documentation

## 2021-05-17 DIAGNOSIS — Z170 Estrogen receptor positive status [ER+]: Secondary | ICD-10-CM | POA: Insufficient documentation

## 2021-05-17 NOTE — Progress Notes (Signed)
  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 During treatment she did very well with radiotherapy and noted some dermatitis without desquamation.    Impression/Plan: 1. 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 I was unable to reach the patient today by phone. We  would be happy to continue to follow her as needed, but she will also continue to follow up with Dr. Burr Medico in medical oncology.         Carola Rhine, PAC

## 2021-05-18 ENCOUNTER — Other Ambulatory Visit: Payer: Self-pay | Admitting: Nurse Practitioner

## 2021-05-18 DIAGNOSIS — Z170 Estrogen receptor positive status [ER+]: Secondary | ICD-10-CM

## 2021-05-18 DIAGNOSIS — C50412 Malignant neoplasm of upper-outer quadrant of left female breast: Secondary | ICD-10-CM

## 2021-05-19 ENCOUNTER — Telehealth: Payer: Self-pay

## 2021-05-19 ENCOUNTER — Encounter: Payer: Self-pay | Admitting: Hematology

## 2021-05-19 ENCOUNTER — Ambulatory Visit (HOSPITAL_BASED_OUTPATIENT_CLINIC_OR_DEPARTMENT_OTHER): Payer: Medicare HMO | Admitting: Hematology

## 2021-05-19 DIAGNOSIS — C50412 Malignant neoplasm of upper-outer quadrant of left female breast: Secondary | ICD-10-CM

## 2021-05-19 DIAGNOSIS — Z170 Estrogen receptor positive status [ER+]: Secondary | ICD-10-CM | POA: Diagnosis not present

## 2021-05-19 NOTE — Telephone Encounter (Signed)
This nurse spoke with patient and her daughter who stated that she has been having some symptoms and would like to see if she can move her appointment from 05/27/21 to today.  Patient states that she has been having pain all over, she is exhausted, been having cold chills and her balance is off.  She has had to use her walker in order to ambulate, her appetite is decreased. Daughter also states that patient passed out at church on Saturday and patient refused to be transported to ED for evaluation.  This nurse advised that there are no open slots for today but this information would be forwarded to MD.

## 2021-05-19 NOTE — Progress Notes (Signed)
[name]   [phone] [phone]   Clinic Follow up Note   Patient Care Team: [name], PA-C as PCP - General (Internal Medicine) [name], RN as Oncology Nurse Navigator [name], RN as Oncology Nurse Navigator [name], MD as Consulting Physician (General Surgery) [name], MD as Consulting Physician (Hematology) [name], MD as Consulting Physician (Radiation Oncology) [name], [name], MD as Consulting Physician (Cardiology)  Date of Service:  05/19/2021  I connected with Cheryl Blankenship on 05/19/2021 at  2:40 PM EDT by telephone visit and verified that I am speaking with the correct person using two identifiers.  I discussed the limitations, risks, security and privacy concerns of performing an evaluation and management service by telephone and the availability of in person appointments. I also discussed with the patient that there may be a patient responsible charge related to this service. The patient expressed understanding and agreed to proceed.   Other persons participating in the visit and their role in the encounter:  patient's children-- daughter Josie Dixon and son Raquel Sarna  Patient's location:  daughter's car Provider's location:  my office  CHIEF COMPLAINT: f/u of left breast cancer  CURRENT THERAPY:  -Herceptin, every 3 weeks -Perjeta added 05/06/21 -Exemestane daily  ASSESSMENT & PLAN:  Cheryl Blankenship is a 70 y.o. female with   1. Malignant neoplasm of upper-outer quadrant of left breast, Stage II, c(T2N1M0), ER+/PR+/HER2+, Grade II, ypT1aN0 -She was diagnosed in 06/2020. She has 12cm calcification and 4.5cm mass in her left breast with 2 abnormal LN. 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  -Workup showed no evidence of distant disease -She started neoadjuvant chemo THP q3weeks on 08/05/20. Due  to poor toleration after C1, she was switched to [employer] on 09/04/20. C2 postponed due to COVID (+) in early 09/2020. Due to poor toleration of C2 Kadcyla, chemo discontinued after cycle 2.  -She was restarted on Kanjinti on 12/10/20, to be given to complete a year -Exemestane started 12/10/20, she is tolerating well  -She underwent left mastectomy on 12/30/20 under Dr. [name]. Pathology showed: focal residual invasive ductal carcinoma s/p neoadjuvant treatment; residual DCIS; margins of resection not involved; all 5 lymph nodes negative (0/5).  -we previously discussed the benefit of adjuvant Perjeta. This was added to kanjinti on 05/06/21. -she has developed body pains, severe fatigue, weakness, balance issues, and chills. She also passed out at church this past weekend. Her symptoms began prior to restarting Perjeta, so I explained that her symptoms are likely not related to treatment. -given her symptoms, I will ordered brain MRI w wo contrast to rule out brain mets. I will see them next week with the MRI results.   2. Bone Health -She has not had DEXA done before. [facility]. -I discussed that AI can decrease her bone density. We will monitor on exemestane.   3. Comorbidities: CHF, poorly controlled DM with neuropathy, HTN, Arthritis in hips -Due to Nausea and stomach pain from Jardiance, she takes Zofran as needed.  -she is on Cymbalta and Neurontin for diabetic neuropathy -Continue medications and f/u with PCP and Cardiologist Dr. Marisue Ivan.  -She had 2 falls in 2021 due to multiple medications and poor sleep. She ambulates with cane.  -Pt is on Lasix which she notes leads to urinary incontinence. She wears depends for this. -Her baseline ECHO  from 07/31/20 was normal, repeat 03/05/21 was stable.   4. Genetic testing, she agreed to proceed but has not proceeded with testing yet.    5. Social Support  -She does have decreased memory, she is on medication.  Her family help her with history.  -She lives in [place] alone in an apartment with other older women. She is currently able to take care of herself independently. Her daughter is in town and her son is in [place]. She has other non-biological children. She lives with her husband     41. COVID (+) in early 09/2020. She was asymptomatic and did not require treatment. She tested negative in later 09/2020. She has recovered well      PLAN:  -continue exemestane -proceed with brain MRI ASAP -labs and f/u after MRI    No problem-specific Assessment & Plan notes found for this encounter.    SUMMARY OF ONCOLOGIC HISTORY: Oncology History Overview Note  Cancer Staging Malignant neoplasm of upper-outer quadrant of left breast in female, estrogen receptor positive ([place]) Staging form: Breast, AJCC 8th Edition - Clinical stage from 07/16/2020: Stage IB (cT2, cN1, cM0, G2, ER+, PR+, HER2+) - Signed by [name], MD on 07/21/2020    Malignant neoplasm of upper-outer quadrant of left breast in female, estrogen receptor positive ([place])  07/01/2020 Mammogram   IMPRESSION: 1. There is a 12 cm span of highly suspicious calcifications involving the upper outer and upper inner quadrants of the left breast. On ultrasound, within this area of calcifications, there are confluent masses at 1 o'clock spanning at least 4.5 cm. An additional suspicious mass is seen in the left breast at 2 o'clock.  -Ultrasound targeted to the left breast at 1 o'clock, 5 cm from the nipple demonstrates a broad area of hypoechoic irregular tissue/confluent masses spanning at least 4.5 cm. There is a separate oval hypoechoic mass with indistinct margins at 2 o'clock, 7 cm from the nipple measuring 0.9 x 0.4 x 0.9 cm. Ultrasound of the left axilla demonstrates 2 abnormally thickened lymph nodes with cortices measuring 6-7 mm. There is an additional lymph node with a borderline cortex of 4 mm.   2. There are 2 lymph nodes  with thickened cortices in the left axilla, and an additional lymph node with a borderline thickened cortex.   07/16/2020 Cancer Staging   Staging form: Breast, AJCC 8th Edition - Clinical stage from 07/16/2020: Stage IIA (cT3, cN1, cM0, G2, ER+, PR+, HER2+) - Signed by [name], MD on 08/05/2020   07/16/2020 Initial Biopsy   Diagnosis 1. 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 Breast, left, needle core biopsy, 1 o'clock - INVASIVE DUCTAL CARCINOMA - SEE COMMENT 3. Lymph node, biopsy - METASTATIC CARCINOMA INVOLVING A LYMPH NODE - SEE COMMENT Microscopic Comment 1. Based on the biopsy, the ductal carcinoma in situ has a comedo pattern, high nuclear grade and measures 0.7 cm in greatest linear extent. Prognostic markers (ER/PR) are pending and will be reported in an addendum. 2. Based on the biopsy, the carcinoma appears Nottingham grade 2 of 3 and measures 1.5 cm in greatest linear extent. Prognostic markers (ER/PR/ki-67/HER2) are pending and will be reported in an addendum. Dr. Saralyn Pilar reviewed the case and agrees with the above diagnosis. These results were called to [facility] on July 17, 2020. 3. Based on the biopsy, the carcinoma appears measures 1.0 cm in greatest linear  extent. Prognostic markers (ER/PR/ki-67/HER2) are pending and will be reported in an addendum.   07/16/2020 Receptors her2   1. PROGNOSTIC INDICATORS Results: IMMUNOHISTOCHEMICAL AND MORPHOMETRIC ANALYSIS PERFORMED MANUALLY Estrogen Receptor: 30%, POSITIVE, STRONG STAINING INTENSITY Progesterone Receptor: 5%, POSITIVE, STRONG STAINING INTENSITY  2. PROGNOSTIC INDICATORS Results: IMMUNOHISTOCHEMICAL AND MORPHOMETRIC ANALYSIS PERFORMED MANUALLY The tumor cells are POSITIVE for Her2 (3+). Estrogen Receptor: 85%, POSITIVE, STRONG STAINING INTENSITY Progesterone Receptor:  60%, POSITIVE, STRONG STAINING INTENSITY Proliferation Marker Ki67: 25%  3. PROGNOSTIC INDICATORS Results: IMMUNOHISTOCHEMICAL AND MORPHOMETRIC ANALYSIS PERFORMED MANUALLY The tumor cells are POSITIVE for Her2 (3+). Estrogen Receptor: 95%, POSITIVE, STRONG STAINING INTENSITY Progesterone Receptor: 45%, POSITIVE, STRONG STAINING INTENSITY Proliferation Marker Ki67: 30%   07/20/2020 Initial Diagnosis   Malignant neoplasm of upper-outer quadrant of left breast in female, estrogen receptor positive ([place])   07/31/2020 Echocardiogram   Baseline Echo  IMPRESSIONS     1. Left ventricular ejection fraction, by estimation, is 65 to 70%. The  left ventricle has normal function. The left ventricle has no regional  wall motion abnormalities. Left ventricular diastolic parameters are  indeterminate. The average left  ventricular global longitudinal strain is -17.1 %.   2. Right ventricular systolic function is normal. The right ventricular  size is normal. There is normal pulmonary artery systolic pressure.   3. The mitral valve is normal in structure. Trivial mitral valve  regurgitation. No evidence of mitral stenosis.   4. The aortic valve is normal in structure. Aortic valve regurgitation is  not visualized. No aortic stenosis is present.    07/31/2020 Breast MRI   IMPRESSION: 1. Extensive non mass enhancement within the left breast extending from the nipple to the chest wall. Overall findings are most compatible with extensive malignancy involving the majority of the left breast. 2. Indeterminate enhancing mass within the upper inner right breast. 3. Three morphologically abnormal lymph nodes within the left axilla. One of these nodes has been biopsied compatible with metastatic adenopathy.     08/03/2020 Imaging   Bone Scan whole body  IMPRESSION: Increased radiotracer uptake in the mid to lower lumbar regions of uncertain etiology. Correlation with radiography to assess  for potential arthropathy in these areas advised. Increased uptake in major joints is likely of arthropathic etiology. Increased uptake in the mid face is likely due to paranasal sinus disease. Distribution of radiotracer uptake in bony structures elsewhere unremarkable.   08/04/2020 Procedure   PAC placement by Dr [name]   08/05/2020 - 10/28/2020 Neo-Adjuvant Chemotherapy   Neoadjuvant THP q3weeks for 4-6 cycles starting 08/05/20, followed by Herceptin or Kadcyla q3weeks to complete 1 year of treatment. Stopped THP after 1 dose due to poor toleration       -I switched her to [place] starting 08/25/20. Stopped after C2 due to poor toleration (10/28/20)   08/07/2020 Imaging   CT CAP  IMPRESSION: 2.7 cm mass in the upper outer left breast, likely corresponding to the patient's known primary breast neoplasm.   8 mm short axis left axillary node, suspicious for nodal metastasis in this clinical context.   No findings suspicious for distant metastasis.   Endometrium is mildly prominent, measuring 11 mm. Correlate for vaginal bleeding and consider pelvic ultrasound for further evaluation if clinically warranted.   08/11/2020 Pathology Results   Diagnosis Breast, right, needle core biopsy, right breast - PAPILLARY LESION - SEE COMMENT Microscopic Comment The biopsy consists of a papillary lesion with a focal area of adenosis. Immunohistochemistry (SMM, calponin and p63) shows retention of  the myoepithelial cell layer in the focus of adenosis. 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 These results were called to [facility] on August 12, 2020.   10/27/2020 Imaging   CT Angio chest  IMPRESSION: Slightly suboptimal opacification of the main pulmonary artery, however no central or proximal segmental pulmonary embolism   Minimal bibasilar atelectasis   Aortic Atherosclerosis (ICD10-I70.0).   10/31/2020 Imaging   CT AP  IMPRESSION: 1.  No acute finding by CT other than a large amount of stool and gas in the right colon and gas in the transverse colon. No small bowel obstruction. 2. Previous cholecystectomy. 3. Aortic atherosclerosis. 4. Umbilical hernia containing only fat. 5. Small amount of fluid in the endometrial canal as seen previously.   Aortic Atherosclerosis (ICD10-I70.0).   12/04/2020 Imaging   MRI Breast IMPRESSION: 1. Evidence of a positive response to interval treatment. There has been a significant improvement of both the extent and strength of the enhancement throughout the outer LEFT breast when compared to the earlier MRI of 07/31/2020. 2. However, there remains scattered areas of mass and non-mass enhancement throughout the outer LEFT breast, involving the upper outer quadrant and lower outer quadrant, as detailed above. 3. The 3 morphologically abnormal lymph nodes in the LEFT axilla identified on previous MRI, 1 a biopsy-proven metastasis, are all slightly smaller indicating an additional positive response to interval treatment. 4. No evidence of malignancy within the RIGHT breast.   12/10/2020 -  Chemotherapy    Patient is on Treatment Plan: BREAST TRASTUZUMAB + PERJETA (05/06/21) Q21D       12/30/2020 Surgery   A. BREAST, LEFT, MASTECTOMY:  - Focal residual invasive ductal carcinoma status post neoadjuvant  treatment.  - Residual ductal carcinoma in situ.  - Margins of resection are not involved.  - Biopsy clip (X2).   B. LYMPH NODES, LEFT #1, DISSECTION:  - Three lymph nodes, negative for carcinoma (0/3).  - Biopsy clip (X1).   C. LYMPH NODES, LEFT #2, DISSECTION:  - Two lymph nodes, negative for carcinoma (0/2).   PROGNOSTIC INDICATOR RESULTS:  - The tumor cells are POSITIVE for Her2 (3+).  - Estrogen Receptor:       POSITIVE, 90%, STRONG STAINING  - Progesterone Receptor:   POSITIVE, 50%, MODERATE STAINING    12/30/2020 Cancer Staging   Staging form: Breast, AJCC 8th  Edition - Pathologic stage from 12/30/2020: No Stage Recommended (ypT1a, pN0, cM0, G2, ER+, PR+, HER2+) - Signed by [name], MD on 01/20/2021 Stage prefix: Post-therapy Histologic grading system: 3 grade system Residual tumor (R): R0 - None   02/17/2021 - 04/08/2021 Radiation Therapy    Site Technique Total Dose (Gy) Dose per Fx (Gy) Completed Fx Beam Energies  Chest Wall, Left: CW_Lt 3D 50.4/50.4 1.8 28/28 10X  Chest Wall, Left: CW_Lt_SCLV 3D 50.4/50.4 1.8 28/28 6X, 15X  Chest Wall, Left: CW_Lt_Bst Electron 10/10 2 5/5 9E       INTERVAL HISTORY:  Cheryl Blankenship was contacted for a follow up of breast cancer. She was last seen by me on 04/16/21. She reports not feeling well "all of a sudden."  Her daughter had reached out to [place] on 05/05/21 regarding some similar symptoms, but the symptoms subsided. Seleny now reports severe fatigue and weakness, abdominal discomfort/pain, body pain, and balance issues. She notes she passed out at church this past weekend. She reports chills but denies diarrhea. Her son noted confusion about why she is receiving treatment.  He also expressed concern over her symptoms, specifically her being cold (which he repeated multiple times).   All other systems were reviewed with the patient and are negative.  MEDICAL HISTORY:  Past Medical History:  Diagnosis Date   Anxiety    Breast cancer ([place])    CHF (congestive heart failure) ([place])    Depression    Diabetes mellitus without complication ([place])    type 2   GERD (gastroesophageal reflux disease)    History of kidney stones    Hypertension    PONV (postoperative nausea and vomiting)    Stroke ([place])     SURGICAL HISTORY: Past Surgical History:  Procedure Laterality Date   APPENDECTOMY     CHOLECYSTECTOMY     COLONOSCOPY     ESOPHAGOGASTRODUODENOSCOPY (EGD) WITH PROPOFOL N/A 11/01/2020   Procedure: ESOPHAGOGASTRODUODENOSCOPY (EGD) WITH PROPOFOL;  Surgeon: Arta Silence, MD;  Location: [facility];   Service: Endoscopy;  Laterality: N/A;   EYE SURGERY     cataracts   MASTECTOMY WITH AXILLARY LYMPH NODE DISSECTION Left 12/30/2020   Procedure: LEFT MASTECTOMY WITH TARGETED AXILLARY LYMPH NODE DISSECTION;  Surgeon: [name], MD;  Location: [facility];  Service: General;  Laterality: Left;   MULTIPLE TOOTH EXTRACTIONS     PORTACATH PLACEMENT N/A 08/04/2020   Procedure: INSERTION PORT-A-CATH WITH ULTRASOUND GUIDANCE;  Surgeon: [name], MD;  Location: [facility];  Service: General;  Laterality: N/A;   STOMACH SURGERY      I have reviewed the social history and family history with the patient and they are unchanged from previous note.  ALLERGIES:  is allergic to coconut (cocos nucifera) allergy skin test, hydrochlorothiazide, lisinopril, and coconut oil.  MEDICATIONS:  Current Outpatient Medications  Medication Sig Dispense Refill   Accu-Chek FastClix Lancets MISC      albuterol (VENTOLIN HFA) 108 (90 Base) MCG/ACT inhaler Inhale 1-2 puffs into the lungs every 4 (four) hours as needed for wheezing or shortness of breath. 8 g 0   ALPRAZolam (XANAX) 0.25 MG tablet Take 1-2 tablets (0.25-0.5 mg total) by mouth 3 (three) times daily as needed for anxiety or sleep. 30 tablet 0   amLODipine (NORVASC) 10 MG tablet Take 1 tablet (10 mg total) by mouth daily. (Patient not taking: No sig reported) 30 tablet 10   aspirin 81 MG chewable tablet Chew 81 mg by mouth daily.      bisacodyl (DULCOLAX) 5 MG EC tablet Take 1 tablet (5 mg total) by mouth daily as needed for moderate constipation. (Patient not taking: No sig reported) 30 tablet 0   carvedilol (COREG) 25 MG tablet Take 1 tablet (25 mg total) by mouth 2 (two) times daily with a meal. 180 tablet 3   Cholecalciferol 25 MCG (1000 UT) capsule Take 1,000 Units by mouth daily.     diphenoxylate-atropine (LOMOTIL) 2.5-0.025 MG tablet TAKE 1 OR 2 TABLETS BY MOUTH 4 TIMES DAILY AS NEEDED FOR DIARRHEA OR LOOSE STOOLS 60 tablet 2   empagliflozin (JARDIANCE)  10 MG TABS tablet Take 10 mg by mouth daily.     exemestane (AROMASIN) 25 MG tablet Take 1 tablet (25 mg total) by mouth daily after breakfast. 90 tablet 2   folic acid (FOLVITE) 1 MG tablet Take 1 mg by mouth daily.     furosemide (LASIX) 20 MG tablet Take 40 mg by mouth daily.     gabapentin (NEURONTIN) 400 MG capsule Take 400 mg by mouth 3 (three) times daily.      glipiZIDE (GLUCOTROL XL)  5 MG 24 hr tablet Take 5 mg by mouth daily with breakfast.     insulin aspart (NOVOLOG) 100 UNIT/ML injection Inject 2-10 Units into the skin 3 (three) times daily before meals. Per sliding scale:  150-199 = 2 units, 200-249 = 4 units, 250-299 = 6 units, 300-349 = 8 units, greater than350 = 10 units.     insulin glargine (LANTUS SOLOSTAR) 100 UNIT/ML Solostar Pen Inject 35 Units into the skin 2 (two) times daily. (Patient taking differently: Inject 70 Units into the skin 2 (two) times daily. Pt takes 70 units in the morning and 70 units at night.) 15 mL 11   lidocaine-prilocaine (EMLA) cream Apply 1 application topically as needed (pain). 30 g 3   losartan (COZAAR) 100 MG tablet TAKE 1 TABLET BY MOUTH EVERY DAY 90 tablet 1   metFORMIN (GLUCOPHAGE) 500 MG tablet Take 500 mg by mouth 2 (two) times daily with a meal.      methocarbamol (ROBAXIN) 500 MG tablet Take 500 mg by mouth every 8 (eight) hours as needed for muscle spasms.     Nutritional Supplements (FEEDING SUPPLEMENT, GLUCERNA 1.2 CAL,) LIQD Take 237 mLs by mouth 3 (three) times daily with meals.     Omega-3 Fatty Acids (FISH OIL) 1000 MG CAPS Take 1,000 mg by mouth daily.     omeprazole (PRILOSEC) 20 MG capsule Take 1 capsule (20 mg total) by mouth daily. 30 capsule 11   ondansetron (ZOFRAN-ODT) 4 MG disintegrating tablet Take 1 tablet (4 mg total) by mouth 2 (two) times daily as needed for up to 30 doses for nausea or vomiting. 30 tablet 0   oxyCODONE-acetaminophen (PERCOCET) 5-325 MG tablet Take 1 tablet by mouth every 8 (eight) hours as needed for  severe pain. 30 tablet 0   potassium chloride (KLOR-CON) 10 MEQ tablet Take 20 mEq by mouth daily.     prochlorperazine (COMPAZINE) 10 MG tablet Take 1 tablet (10 mg total) by mouth every 6 (six) hours as needed for nausea or vomiting. 30 tablet 5   promethazine (PHENERGAN) 25 MG suppository Place 1 suppository (25 mg total) rectally every 8 (eight) hours as needed for nausea or vomiting. (Patient not taking: No sig reported) 30 each 1   RESTASIS 0.05 % ophthalmic emulsion Place 1 drop into both eyes 2 (two) times daily as needed (dry eyes).     spironolactone (ALDACTONE) 25 MG tablet TAKE 1 TABLET BY MOUTH EVERY DAY 90 tablet 1   sucralfate (CARAFATE) 1 GM/10ML suspension Take 10 mLs (1 g total) by mouth 4 (four) times daily -  with meals and at bedtime. (Patient not taking: No sig reported) 420 mL 0   vitamin B-12 (CYANOCOBALAMIN) 1000 MCG tablet Take 1,000 mcg by mouth daily.     zolpidem (AMBIEN) 5 MG tablet Take 1 tablet (5 mg total) by mouth at bedtime as needed for sleep. 20 tablet 0   No current facility-administered medications for this visit.    PHYSICAL EXAMINATION: ECOG PERFORMANCE STATUS: 2 - Symptomatic, <50% confined to bed  There were no vitals filed for this visit. Wt Readings from Last 3 Encounters:  05/06/21 206 lb (93.4 kg)  04/16/21 211 lb 6.4 oz (95.9 kg)  03/26/21 209 lb 12.8 oz (95.2 kg)     No vitals taken today, Exam not performed today  LABORATORY DATA:  I have reviewed the data as listed CBC Latest Ref Rng & Units 05/06/2021 04/16/2021 03/25/2021  WBC 4.0 - 10.5 K/uL 4.5 4.4 5.4  Hemoglobin 12.0 - 15.0 g/dL 11.4(L) 10.6(L) 11.0(L)  Hematocrit 36.0 - 46.0 % 35.3(L) 31.6(L) 33.7(L)  Platelets 150 - 400 K/uL 265 242 235     CMP Latest Ref Rng & Units 05/06/2021 04/16/2021 03/25/2021  Glucose 70 - 99 mg/dL 147(H) 141(H) 84  BUN 8 - 23 mg/dL _0 Creatinine 0.44 - 1.00 mg/dL 1.21(H) 1.05(H) 0.89  Sodium 135 - 145 mmol/L 142 141 143  Potassium 3.5 - 5.1  mmol/L 4.6 4.1 3.9  Chloride 98 - 111 mmol/L 104 108 108  CO2 22 - 32 mmol/L _1 Calcium 8.9 - 10.3 mg/dL 11.3(H) 10.5(H) 11.1(H)  Total Protein 6.5 - 8.1 g/dL 7.5 7.0 7.2  Total Bilirubin 0.3 - 1.2 mg/dL 0.5 0.4 0.5  Alkaline Phos 38 - 126 U/L 75 63 71  AST 15 - 41 U/L 17 14(L) 16  ALT 0 - 44 U/L _2 RADIOGRAPHIC STUDIES: I have personally reviewed the radiological images as listed and agreed with the findings in the report. No results found.    Orders Placed This Encounter  Procedures   MR Brain W Wo Contrast    Standing Status:   Future    Standing Expiration Date:   05/19/2022    Order Specific Question:   If indicated for the ordered procedure, I authorize the administration of contrast media per Radiology protocol    Answer:   Yes    Order Specific Question:   What is the patient's sedation requirement?    Answer:   No Sedation    Order Specific Question:   Does the patient have a pacemaker or implanted devices?    Answer:   Yes    Order Specific Question:   Use SRS Protocol?    Answer:   No    Order Specific Question:   Preferred imaging location?    Answer:   [facility] (table limit - 550 lbs)   All questions were answered. The patient knows to call the clinic with any problems, questions or concerns. No barriers to learning was detected. The total time spent in the appointment was 30 minutes.     [name], MD 05/19/2021   I, Wilburn Mylar, am acting as scribe for [name], MD.   I have reviewed the above documentation for accuracy and completeness, and I agree with the above.

## 2021-05-19 NOTE — Telephone Encounter (Signed)
Pt's son, [name] called and LVM stating, "I need someone to call me... my mom is very sick.Marland KitchenMarland KitchenI know nothing about the treatments you are giving her and I am not happy at all. It seems like you are trying to kill my mother." This LPN allowed Dr Burr Medico to hear message. She will call pt and son today.   This nurse called son and explained to him that Dr Burr Medico would be calling. Shawn verbalized thanks and understanding.

## 2021-05-20 ENCOUNTER — Inpatient Hospital Stay: Payer: Medicare HMO

## 2021-05-20 ENCOUNTER — Inpatient Hospital Stay: Payer: Medicare HMO | Admitting: Hematology

## 2021-05-21 ENCOUNTER — Telehealth: Payer: Self-pay

## 2021-05-21 NOTE — Telephone Encounter (Signed)
This nurse reached out to patient to schedule MR of the Brain.  This nurse left a message to call central scheduling to make an appointment that is good for her.  This nurse provided the number for central scheduling.

## 2021-05-22 ENCOUNTER — Emergency Department (HOSPITAL_COMMUNITY)
Admission: EM | Admit: 2021-05-22 | Discharge: 2021-05-22 | Disposition: A | Discharge status: Home / Self Care | Payer: Medicare HMO | Attending: Emergency Medicine | Admitting: Emergency Medicine

## 2021-05-22 ENCOUNTER — Emergency Department (HOSPITAL_COMMUNITY): Payer: Medicare HMO

## 2021-05-22 ENCOUNTER — Other Ambulatory Visit: Payer: Self-pay

## 2021-05-22 DIAGNOSIS — Z79899 Other long term (current) drug therapy: Secondary | ICD-10-CM | POA: Diagnosis not present

## 2021-05-22 DIAGNOSIS — M7918 Myalgia, other site: Secondary | ICD-10-CM | POA: Insufficient documentation

## 2021-05-22 DIAGNOSIS — R079 Chest pain, unspecified: Secondary | ICD-10-CM | POA: Insufficient documentation

## 2021-05-22 DIAGNOSIS — Z7982 Long term (current) use of aspirin: Secondary | ICD-10-CM | POA: Insufficient documentation

## 2021-05-22 DIAGNOSIS — Z87891 Personal history of nicotine dependence: Secondary | ICD-10-CM | POA: Insufficient documentation

## 2021-05-22 DIAGNOSIS — R5383 Other fatigue: Secondary | ICD-10-CM | POA: Diagnosis not present

## 2021-05-22 DIAGNOSIS — Z7984 Long term (current) use of oral hypoglycemic drugs: Secondary | ICD-10-CM | POA: Insufficient documentation

## 2021-05-22 DIAGNOSIS — R0602 Shortness of breath: Secondary | ICD-10-CM | POA: Insufficient documentation

## 2021-05-22 DIAGNOSIS — I509 Heart failure, unspecified: Secondary | ICD-10-CM | POA: Diagnosis not present

## 2021-05-22 DIAGNOSIS — Z794 Long term (current) use of insulin: Secondary | ICD-10-CM | POA: Insufficient documentation

## 2021-05-22 DIAGNOSIS — E119 Type 2 diabetes mellitus without complications: Secondary | ICD-10-CM | POA: Insufficient documentation

## 2021-05-22 DIAGNOSIS — R42 Dizziness and giddiness: Secondary | ICD-10-CM | POA: Insufficient documentation

## 2021-05-22 DIAGNOSIS — R5381 Other malaise: Secondary | ICD-10-CM

## 2021-05-22 DIAGNOSIS — Z853 Personal history of malignant neoplasm of breast: Secondary | ICD-10-CM | POA: Insufficient documentation

## 2021-05-22 DIAGNOSIS — Z20822 Contact with and (suspected) exposure to covid-19: Secondary | ICD-10-CM | POA: Diagnosis not present

## 2021-05-22 DIAGNOSIS — R791 Abnormal coagulation profile: Secondary | ICD-10-CM | POA: Insufficient documentation

## 2021-05-22 DIAGNOSIS — I110 Hypertensive heart disease with heart failure: Secondary | ICD-10-CM | POA: Insufficient documentation

## 2021-05-22 LAB — CBC
HCT: 35.2 % — ABNORMAL LOW (ref 36.0–46.0)
Hemoglobin: 11.1 g/dL — ABNORMAL LOW (ref 12.0–15.0)
MCH: 28 pg (ref 26.0–34.0)
MCHC: 31.5 g/dL (ref 30.0–36.0)
MCV: 88.7 fL (ref 80.0–100.0)
Platelets: 399 10*3/uL (ref 150–400)
RBC: 3.97 MIL/uL (ref 3.87–5.11)
RDW: 12.6 % (ref 11.5–15.5)
WBC: 4.3 10*3/uL (ref 4.0–10.5)
nRBC: 0 % (ref 0.0–0.2)

## 2021-05-22 LAB — COMPREHENSIVE METABOLIC PANEL
ALT: 18 U/L (ref 0–44)
AST: 18 U/L (ref 15–41)
Albumin: 3.2 g/dL — ABNORMAL LOW (ref 3.5–5.0)
Alkaline Phosphatase: 60 U/L (ref 38–126)
Anion gap: 10 (ref 5–15)
BUN: 31 mg/dL — ABNORMAL HIGH (ref 8–23)
CO2: 30 mmol/L (ref 22–32)
Calcium: 10.5 mg/dL — ABNORMAL HIGH (ref 8.9–10.3)
Chloride: 100 mmol/L (ref 98–111)
Creatinine, Ser: 1.3 mg/dL — ABNORMAL HIGH (ref 0.44–1.00)
GFR, Estimated: 45 mL/min — ABNORMAL LOW (ref >60)
Glucose, Bld: 142 mg/dL — ABNORMAL HIGH (ref 70–99)
Potassium: 4.2 mmol/L (ref 3.5–5.1)
Sodium: 140 mmol/L (ref 135–145)
Total Bilirubin: 0.6 mg/dL (ref 0.3–1.2)
Total Protein: 7.2 g/dL (ref 6.5–8.1)

## 2021-05-22 LAB — D-DIMER, QUANTITATIVE: D-Dimer, Quant: 3.78 ug/mL-FEU — ABNORMAL HIGH (ref 0.00–0.50)

## 2021-05-22 LAB — RESP PANEL BY RT-PCR (FLU A&B, COVID) ARPGX2
Influenza A by PCR: NEGATIVE
Influenza B by PCR: NEGATIVE
SARS Coronavirus 2 by RT PCR: NEGATIVE

## 2021-05-22 LAB — CBG MONITORING, ED: Glucose-Capillary: 52 mg/dL — ABNORMAL LOW (ref 70–99)

## 2021-05-22 LAB — TROPONIN I (HIGH SENSITIVITY)
Troponin I (High Sensitivity): 6 ng/L (ref <18)
Troponin I (High Sensitivity): 7 ng/L (ref <18)

## 2021-05-22 LAB — BRAIN NATRIURETIC PEPTIDE: B Natriuretic Peptide: 14.2 pg/mL (ref 0.0–100.0)

## 2021-05-22 IMAGING — DX DG CHEST 1V PORT
1 series · 1 of 1 positions shown · non-contrast
Comparison: [DATE].

CLINICAL DATA: Chest pain and shortness of breath.

EXAM:
PORTABLE CHEST 1 VIEW

[chest]
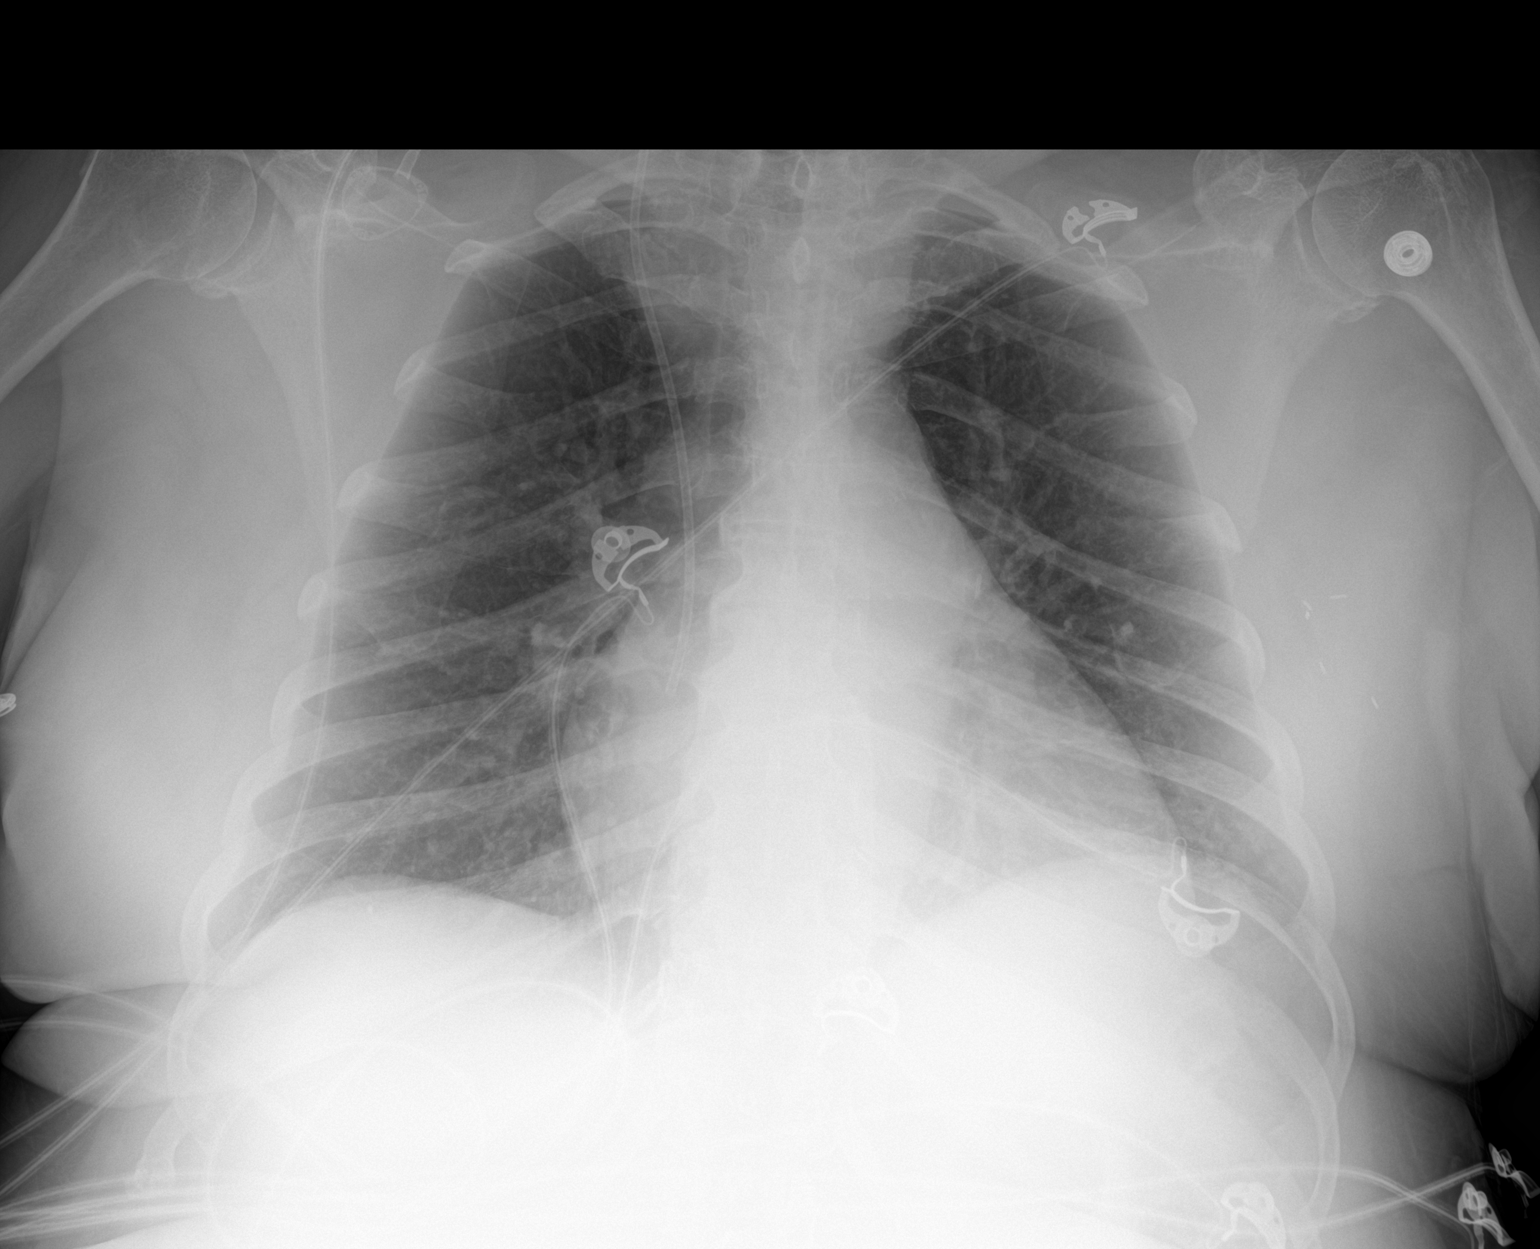

[1 of 1 positions shown; findings below may reference images not displayed]

FINDINGS: Right chest wall Port-A-Cath with tip at the superior cavoatrial
junction. Left axillary surgical clips. The heart size and
mediastinal contours are unchanged. No focal consolidation. No
pleural effusion. No pneumothorax. Degenerative changes bilateral
shoulders. Thoracic spondylosis.
IMPRESSION: No active cardiopulmonary disease.

## 2021-05-22 IMAGING — CT CT ANGIO CHEST
2 of 6 series · 18 of 36 positions shown · IV contrast (omnipaque)
Comparison: [DATE]

CLINICAL DATA: PE suspected, low/intermediate prob, positive
D-dimer. Chest pain, dyspnea.

EXAM:
CT ANGIOGRAPHY CHEST WITH CONTRAST
TECHNIQUE: Multidetector CT imaging of the chest was performed using the
standard protocol during bolus administration of intravenous
contrast. Multiplanar CT image reconstructions and MIPs were
obtained to evaluate the vascular anatomy.
CONTRAST:  50mL OMNIPAQUE IOHEXOL 350 MG/ML SOLN

[Series 7: pe thins · axial · 0.89mm/px · z∈[+1295,+1507]mm · 17 of 338 slices shown]
[im 17/338  lung]
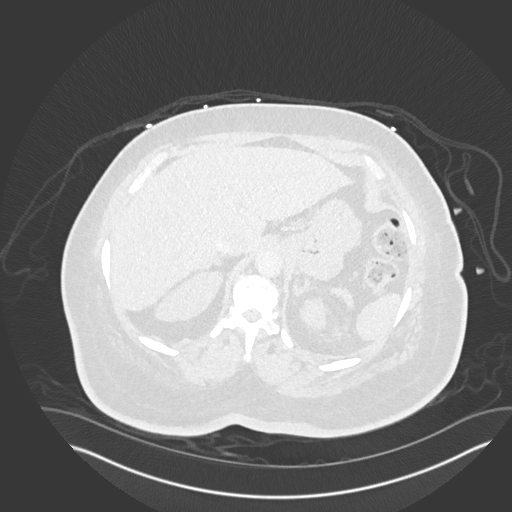
[im 34/338  mediastinal]
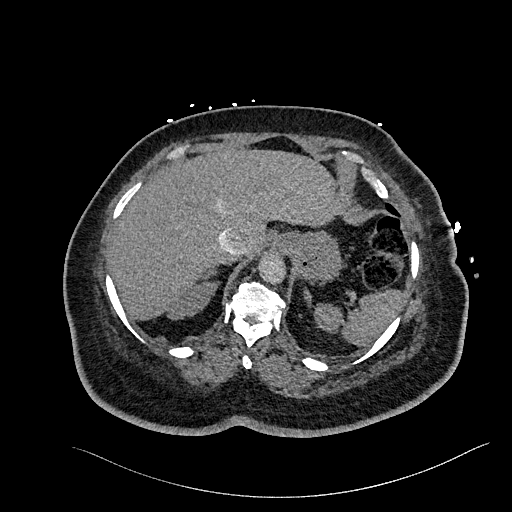
[im 51/338  lung]
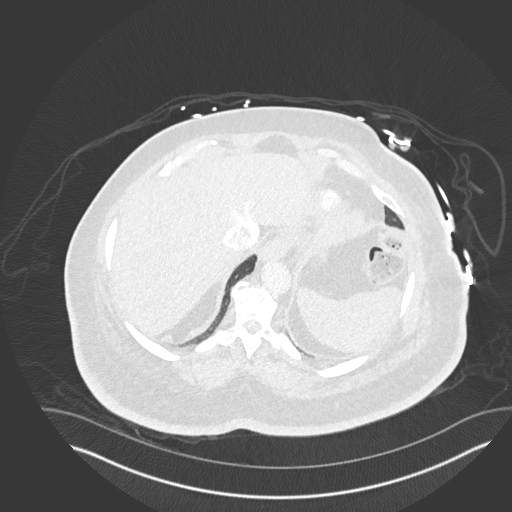
[im 68/338  mediastinal]
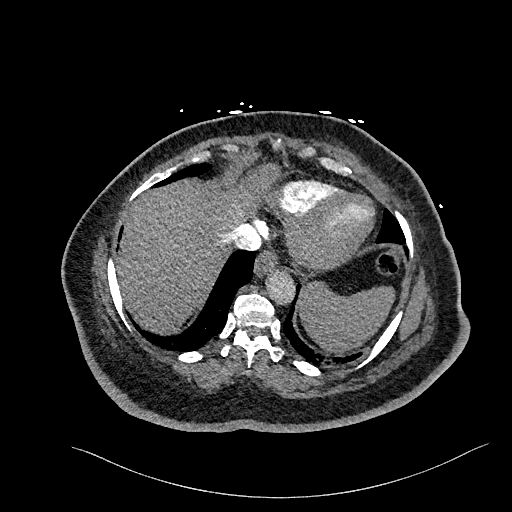
[im 102/338  lung]
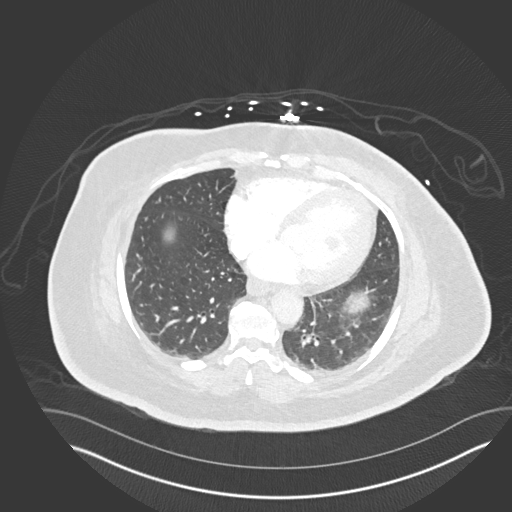
[im 118/338  mediastinal]
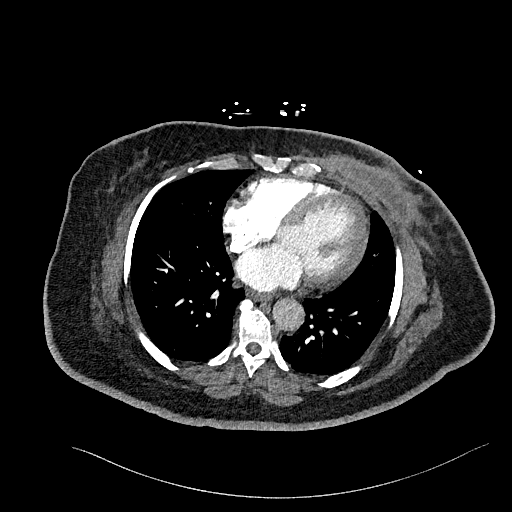
[im 135/338  lung]
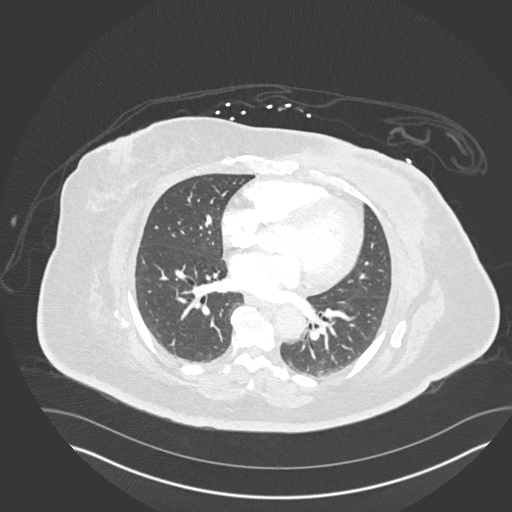
[im 152/338  mediastinal]
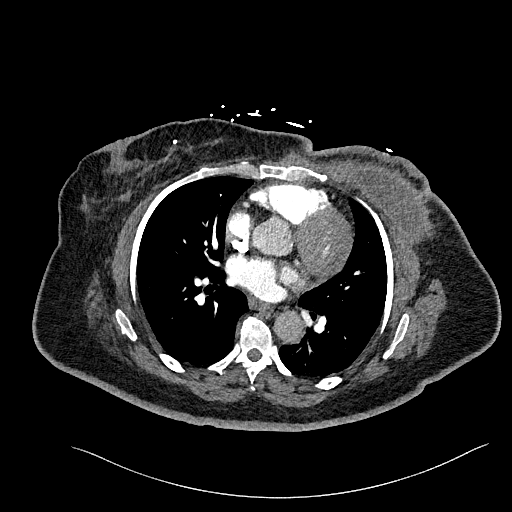
[im 169/338  lung]
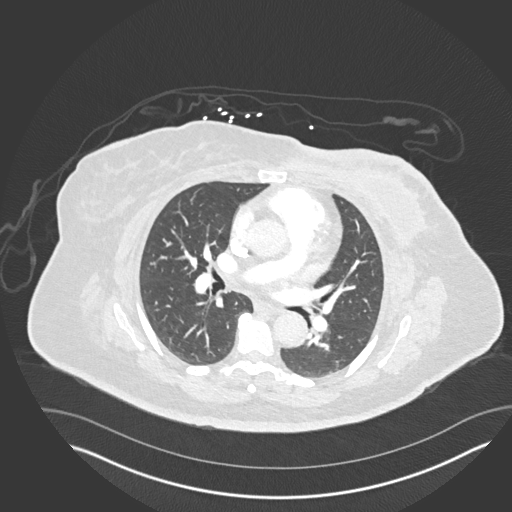
[im 186/338  mediastinal]
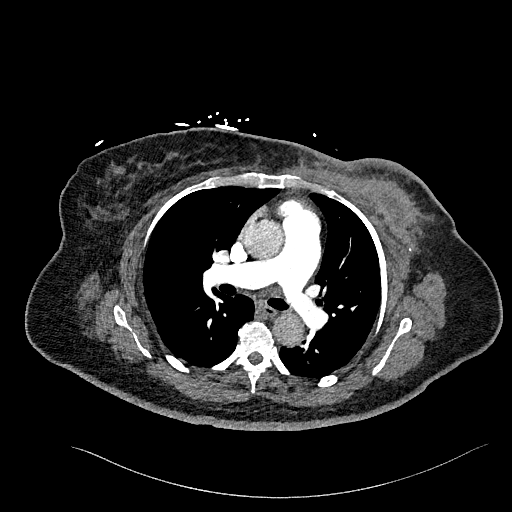
[im 203/338  lung]
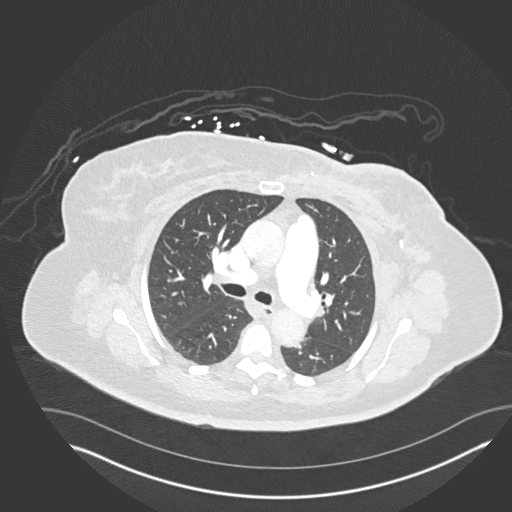
[im 220/338  mediastinal]
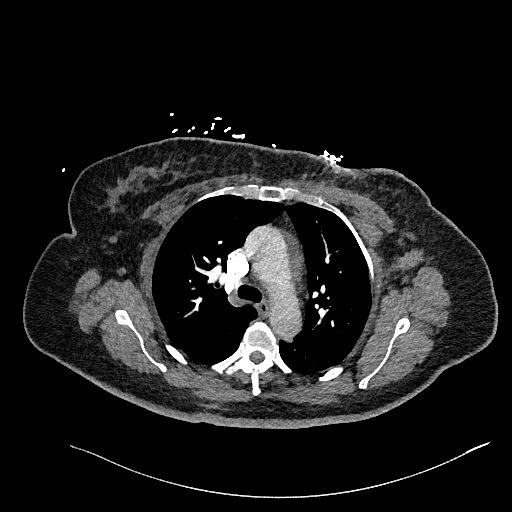
[im 236/338  lung]
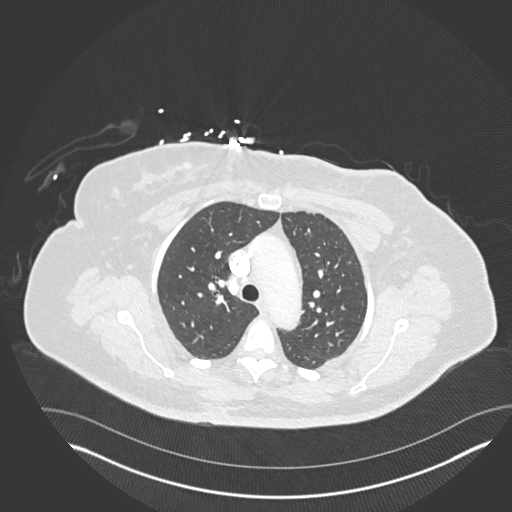
[im 270/338  mediastinal]
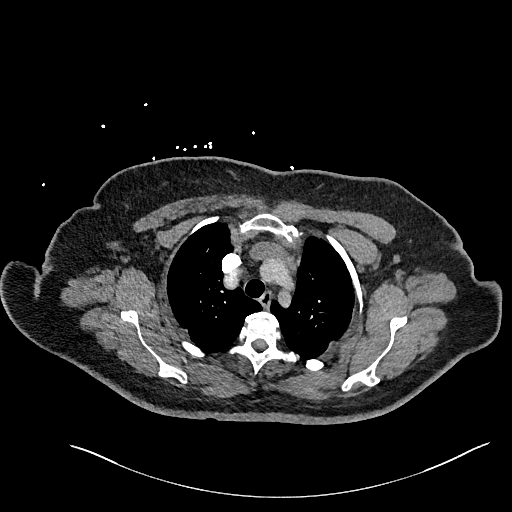
[im 287/338  lung]
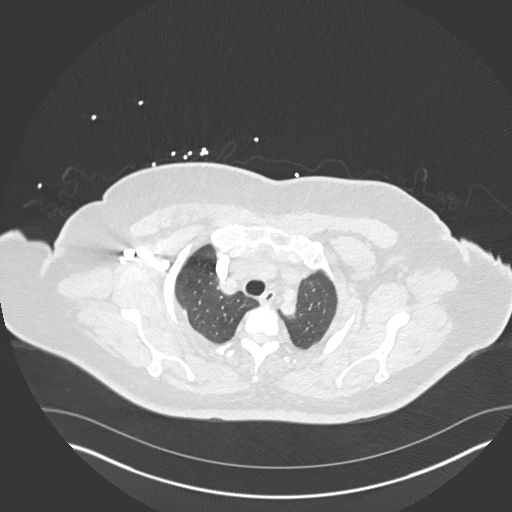
[im 304/338  mediastinal]
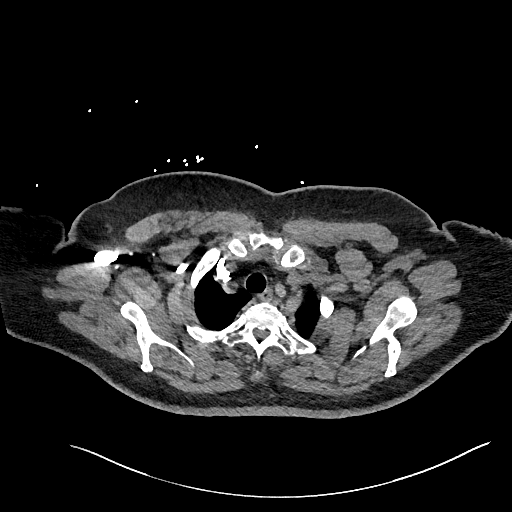
[im 321/338  lung]
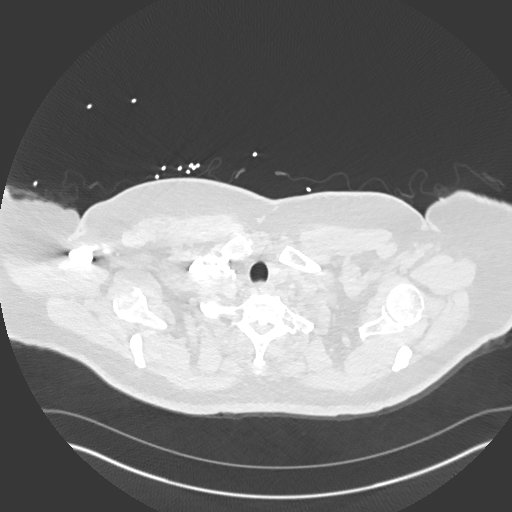

[Series 8: pe 2mm cor · coronal · 0.59mm/px · 1 of 149 slices shown]
[im 75/149  mediastinal]
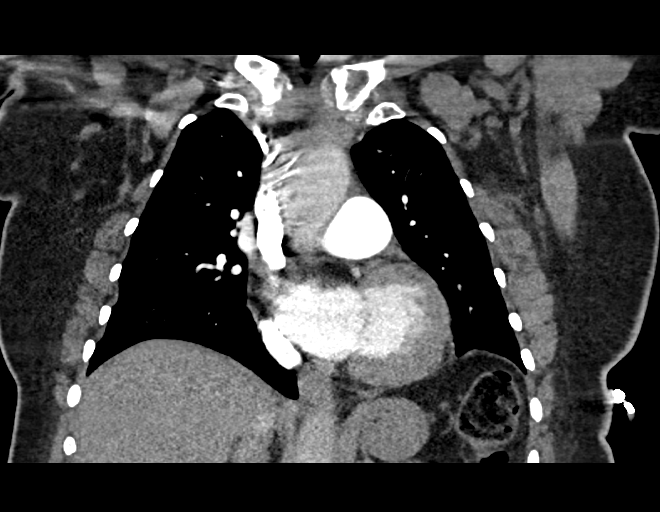

[18 of 36 positions shown; findings below may reference images not displayed]

FINDINGS: Cardiovascular: There is adequate opacification of the pulmonary
arterial tree. There is no intraluminal filling defect identified to
suggest acute pulmonary embolism. The central pulmonary arteries are
of normal caliber.

Mild coronary artery calcification. Global cardiac size within
normal limits. No pericardial effusion. The thoracic aorta is of
normal caliber.

Mediastinum/Nodes: No enlarged mediastinal, hilar, or axillary lymph
nodes. Thyroid gland, trachea, and esophagus demonstrate no
significant findings.

Lungs/Pleura: Benign calcified granuloma within the a left upper
lobe. The lungs are otherwise clear. No pneumothorax or pleural
effusion. Central airways are widely patent.

Upper Abdomen: No acute abnormality.

Musculoskeletal: No acute bone abnormality. No lytic or blastic bone
lesion. Interval left mastectomy and axillary lymph node dissection.
A relatively thick walled loculated subcutaneous fluid collection is
seen within the resection bed measuring 2.7 x 9.6 cm possibly
representing a postoperative seroma or chronic hematoma. Super
infection is not excluded on this examination. Mild subcutaneous
infiltration within this region and associated dermal thickening may
be postoperative in nature or inflammatory.

Review of the MIP images confirms the above findings.
IMPRESSION: No pulmonary embolism.

Interval left mastectomy and axillary lymph node dissection. 9.6 cm
subcutaneous, thick walled fluid collection within the a resection
bed may represent a postoperative seroma or chronic hematoma.
Superinfection, however, is not excluded on this examination.
Surrounding inflammatory changes within the subcutaneous soft
tissues and associated dermal thickening may be inflammatory or
postoperative in nature. Correlation with clinical examination is
recommended.

Mild coronary artery calcification.

## 2021-05-22 MED ORDER — IOHEXOL 350 MG/ML SOLN
50.0000 mL | Freq: Once | INTRAVENOUS | Status: AC | PRN
  Administered 2021-05-22: 50 mL via INTRAVENOUS

## 2021-05-22 MED ORDER — LACTATED RINGERS IV BOLUS: 500.0000 mL | Freq: Once | INTRAVENOUS | Status: DC

## 2021-05-22 MED ORDER — ACETAMINOPHEN 500 MG PO TABS
1000.0000 mg | ORAL_TABLET | Freq: Once | ORAL | Status: AC
  Administered 2021-05-22: 1000 mg via ORAL
  Filled 2021-05-22: qty 2

## 2021-05-22 MED ORDER — LACTATED RINGERS IV BOLUS
500.0000 mL | Freq: Once | INTRAVENOUS | Status: AC
  Administered 2021-05-22: 500 mL via INTRAVENOUS

## 2021-05-22 MED ORDER — ASPIRIN 81 MG PO CHEW: 324.0000 mg | CHEWABLE_TABLET | Freq: Once | ORAL | Status: AC

## 2021-05-22 NOTE — ED Notes (Signed)
Provider at bedside at this time

## 2021-05-22 NOTE — ED Notes (Signed)
Attempting to locate transportation for the pt home at this time

## 2021-05-22 NOTE — ED Notes (Signed)
Radiology here for ct

## 2021-05-22 NOTE — ED Triage Notes (Signed)
Pt here via EMS from home with c/o of chest pain and SOB X1 week. Currently undergoing Chemo TX at [place]. Has multiple syncopal episodes during this time.  Pt resisted treatment at that time.   EMS gave  324 ASA 0.5 Nitro with no relief.   114/72 HR 80 96% RA  RR 20 CBG 163 20g LFA

## 2021-05-22 NOTE — ED Notes (Signed)
Received verbal report from Crystal B RN at this time 

## 2021-05-22 NOTE — ED Notes (Signed)
Initial labs redrawn and sent; per lab they didn't receive initial labs that were sent.

## 2021-05-22 NOTE — ED Provider Notes (Signed)
[facility] EMERGENCY DEPARTMENT Provider Note   CSN: [id number] Arrival date & time: 05/22/21  1445     History Chief Complaint  Patient presents with   Chest Pain   Shortness of Breath    Cheryl Blankenship is a 70 y.o. female with PMHx HTN, T2DM, CHF, breast cancer who presents for evaluation of generalized malaise, chest pain, and shortness of breath.  Of note, patient has a history of breast cancer.  She states that she has undergone vasectomy and is currently receiving chemotherapy.  Approximately 2 weeks ago she received her second round of chemotherapy.  Around 10 days ago, she states that she began to experience generalized malaise, diffuse myalgias, decreased appetite, decreased energy, and exertional dyspnea.  She states that she has been having much more difficulty getting up around the house and attending to errands.  Shortly after the onset of her symptoms, she began to experience intermittent nonradiating chest pain.  She states that the pain initially felt like her GERD, and it was accompanied with frequent belching.  She states that she took her GERD medication without significant improvement.  She denies any other provoking or palliating features.  Approximately 1 week ago on Saturday, the patient notes that she was at church when she stood up and then felt lightheaded.  She states that she subsequently experienced a syncopal episode.  She denies any fever or chills.  No other infectious symptoms.  She denies vomiting, diarrhea, or abdominal pain. Given this constellation of symptoms, she subsequently presented to our emergency department for further evaluation.     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   Chest pain 10/30/2020   Constipation 10/30/2020   Type 2 diabetes mellitus without complication ([place]) [id number]   Exertional dyspnea 08/26/2020   Essential hypertension [id number]   Acute diastolic CHF (congestive heart failure) ([place]) 08/26/2020   Obesity, diabetes, and hypertension syndrome ([place]) 08/26/2020   Malignant neoplasm of upper-outer quadrant of left breast in female, estrogen receptor positive ([place]) 07/20/2020    Past Surgical History:  Procedure Laterality Date   APPENDECTOMY     CHOLECYSTECTOMY     COLONOSCOPY     ESOPHAGOGASTRODUODENOSCOPY (EGD) WITH PROPOFOL N/A 11/01/2020   Procedure: ESOPHAGOGASTRODUODENOSCOPY (EGD) WITH PROPOFOL;  Surgeon: Arta Silence, MD;  Location: [facility] ENDOSCOPY;  Service: Endoscopy;  Laterality: N/A;   EYE SURGERY     cataracts   MASTECTOMY WITH AXILLARY LYMPH NODE DISSECTION Left 12/30/2020   Procedure: LEFT MASTECTOMY WITH TARGETED AXILLARY LYMPH NODE DISSECTION;  Surgeon: [name], MD;  Location: [place];  Service: General;  Laterality: Left;   MULTIPLE TOOTH EXTRACTIONS     PORTACATH PLACEMENT N/A 08/04/2020   Procedure: INSERTION PORT-A-CATH WITH ULTRASOUND GUIDANCE;  Surgeon: [name], MD;  Location: [place];  Service: General;  Laterality: N/A;   STOMACH SURGERY       OB History   No obstetric history on file.     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 Authorizing Provider  Accu-Chek FastClix Lancets MISC 1 each by Other route in the morning, at noon, and at bedtime. 08/14/20  Yes [provider]  albuterol (VENTOLIN HFA) 108 (90 Base) MCG/ACT inhaler Inhale 1-2 puffs into the lungs every 4 (four) hours as needed for wheezing or shortness of breath. 09/01/20  Yes Alla Feeling, NP  amLODipine (NORVASC) 10 MG tablet Take 1 tablet (10 mg total) by mouth daily. 10/09/20 09/02/21 Yes [name], [name], MD  aspirin 81 MG chewable tablet Chew 81 mg by mouth daily.    Yes [provider]  carvedilol (COREG) 25 MG tablet Take 1 tablet (25 mg total) by mouth 2 (two) times daily with a meal. 10/09/20 05/22/21 Yes [name], [name], MD  Cholecalciferol 25 MCG (1000 UT) capsule Take 1,000 Units by mouth daily. 06/25/20  Yes [provider]  diphenoxylate-atropine (LOMOTIL) 2.5-0.025 MG tablet TAKE 1 OR 2 TABLETS BY MOUTH 4 TIMES DAILY AS NEEDED FOR DIARRHEA OR LOOSE STOOLS Patient taking differently: Take 1-2 tablets by mouth 4 (four) times daily as needed for diarrhea or loose stools. 03/23/21  Yes Alla Feeling, NP  DULoxetine (CYMBALTA) 60 MG capsule Take 60 mg by mouth daily.   Yes [provider]  empagliflozin (JARDIANCE) 10 MG TABS tablet Take 10 mg by mouth daily.   Yes [provider]  exemestane (AROMASIN) 25 MG tablet Take 1 tablet (25 mg total) by mouth daily after breakfast. 03/25/21  Yes Alla Feeling, NP  folic acid (FOLVITE) 1 MG tablet Take 1 mg by mouth daily. 07/14/20  Yes [provider]  gabapentin (NEURONTIN) 400 MG capsule Take 400 mg by mouth 3 (three) times daily.  04/21/20  Yes [provider]  glipiZIDE (GLUCOTROL) 10 MG tablet Take 10 mg by mouth daily before breakfast.   Yes [provider]  insulin aspart (NOVOLOG) 100 UNIT/ML injection Inject 2-10 Units into the skin 3 (three) times daily before meals. Per sliding scale:  150-199 = 2 units, 200-249 = 4 units, 250-299 = 6 units, 300-349 = 8 units, greater than350 = 10 units.   Yes [provider]  insulin glargine (LANTUS SOLOSTAR) 100 UNIT/ML Solostar Pen Inject 35 Units into the skin 2 (two) times daily. Patient taking differently: Inject 70 Units into the skin 2 (two) times daily. Pt takes 70 units in the morning and 70 units at night. 08/27/20  Yes Oretha Milch D, MD  lidocaine-prilocaine (EMLA) cream Apply 1 application topically  as needed (pain). Patient taking differently: Apply 1 application topically daily. 01/20/21  Yes Alla Feeling, NP  losartan (COZAAR) 100 MG tablet TAKE 1 TABLET BY MOUTH EVERY DAY Patient taking differently: Take 100 mg by mouth daily. 03/30/21  Yes [name], [name], MD  metFORMIN (GLUCOPHAGE) 500 MG tablet Take 500 mg by mouth 2 (two) times daily with a meal.  06/25/20  Yes [provider]  methocarbamol (ROBAXIN) 500 MG tablet Take 500 mg by mouth every 8 (eight) hours as needed for muscle spasms.   Yes [provider]  Omega-3 Fatty Acids (FISH OIL) 1000 MG CAPS Take 1,000 mg by mouth daily.   Yes [provider]  omeprazole (PRILOSEC) 20 MG capsule Take 1 capsule (20 mg total) by mouth daily. 10/09/20  Yes [name], [name], MD  ondansetron (ZOFRAN-ODT) 4 MG disintegrating tablet Take 1 tablet (4 mg total) by mouth 2 (two) times daily as needed for up to 30 doses for nausea or vomiting. 10/26/20  Yes Curatolo, Adam, DO  oxyCODONE-acetaminophen (PERCOCET) 5-325 MG tablet Take 1 tablet by mouth every 8 (eight) hours as needed for severe pain. Patient taking differently: Take 1 tablet by mouth every 6 (six) hours as needed for severe pain.  02/26/21 02/26/22 Yes [name], MD  potassium chloride (KLOR-CON) 10 MEQ tablet Take 20 mEq by mouth daily.   Yes [provider]  prochlorperazine (COMPAZINE) 10 MG tablet Take 1 tablet (10 mg total) by mouth every 6 (six) hours as needed for nausea or vomiting. 03/19/21  Yes Tyler Pita, MD  RESTASIS 0.05 % ophthalmic emulsion Place 1 drop into both eyes 2 (two) times daily as needed (dry eyes). 07/13/20  Yes [provider]  spironolactone (ALDACTONE) 25 MG tablet TAKE 1 TABLET BY MOUTH EVERY DAY Patient taking differently: Take 25 mg by mouth daily. 03/30/21  Yes [name], [name], MD  vitamin B-12 (CYANOCOBALAMIN) 1000 MCG tablet Take 1,000 mcg by mouth daily.   Yes [provider]  zolpidem (AMBIEN) 5 MG tablet Take 1 tablet (5 mg total) by mouth at bedtime as needed for sleep. 10/28/20  Yes [name], MD  ALPRAZolam Duanne Moron) 0.25 MG tablet Take 1-2 tablets (0.25-0.5 mg total) by mouth 3 (three) times daily as needed for anxiety or sleep. Patient not taking: Reported on 05/22/2021 02/26/21   [name], MD  bisacodyl (DULCOLAX) 5 MG EC tablet Take 1 tablet (5 mg total) by mouth daily as needed for moderate constipation. Patient not taking: No sig reported 11/04/20   Antonieta Pert, MD  promethazine (PHENERGAN) 25 MG suppository Place 1 suppository (25 mg total) rectally every 8 (eight) hours as needed for nausea or vomiting. Patient not taking: No sig reported 10/28/20   [name], MD  sucralfate (CARAFATE) 1 GM/10ML suspension Take 10 mLs (1 g total) by mouth 4 (four) times daily -  with meals and at bedtime. Patient not taking: No sig reported 11/04/20   Antonieta Pert, MD    Allergies    Coconut (cocos nucifera) allergy skin test, Hydrochlorothiazide, Lisinopril, and Coconut oil  Review of Systems   Review of Systems  Constitutional:  Positive for activity change, appetite change and fatigue. Negative for chills and fever.  HENT:  Negative for ear pain and sore  throat.   Eyes:  Negative for pain and visual disturbance.  Respiratory:  Positive for shortness of breath. Negative for cough.   Cardiovascular:  Positive for chest pain. Negative for palpitations.  Gastrointestinal:  Negative for abdominal pain and vomiting.  Genitourinary:  Negative for dysuria and hematuria.  Musculoskeletal:  Positive for myalgias. Negative for arthralgias and back pain.  Skin:  Negative for color change and rash.  Neurological:  Negative for seizures and syncope.  All other systems reviewed and are negative.  Physical Exam Updated Vital Signs BP (!) 137/124   Pulse 64   Temp 97.8 F (36.6 C) (Oral)   Resp 14   Ht '5\' 3"'$  (1.6 m)   Wt 88.9 kg   SpO2 100%   BMI 34.72 kg/m   Physical Exam Vitals and nursing note reviewed.  Constitutional:      General: She is not in acute distress.    Appearance: She is well-developed.  HENT:     Head: Normocephalic and atraumatic.     Mouth/Throat:     Mouth: Mucous membranes are dry.  Eyes:     Conjunctiva/sclera: Conjunctivae normal.  Cardiovascular:     Rate and Rhythm: Normal rate and regular rhythm.     Heart sounds: No murmur heard. Pulmonary:     Effort: Pulmonary effort is normal. No respiratory distress.     Breath sounds: Normal breath sounds.     Comments: Normal respiratory rate and effort.  Lungs clear to auscultation bilaterally. Abdominal:     Palpations: Abdomen is soft.     Tenderness: There is no abdominal tenderness.  Musculoskeletal:     Cervical back: Neck supple.     Right lower leg: Edema present.     Left lower leg: Edema present.     Comments: 1+ bilateral lower extremity edema, which is symmetric.  No erythema.  No bruising.  Neurovascular intact with 2+ distal pulses.  Skin:    General: Skin is warm and dry.     Capillary Refill: Capillary refill takes less than 2 seconds.     Comments: Hyperpigmentation of skin overlying left anterior chest wall, and site of previous radiation  treatment.  No erythema.  Mild tenderness over the anterior chest wall along previous mastectomy scar.  No fluctuance appreciated on exam.  No discharge or drainage.  Neurological:     Mental Status: She is alert.    ED Results / Procedures / Treatments   Labs (all labs ordered are listed, but only abnormal results are displayed) Labs Reviewed  CBC - Abnormal; Notable for the following components:      Result Value   Hemoglobin 11.1 (*)    HCT 35.2 (*)    All other components within normal limits  COMPREHENSIVE METABOLIC PANEL - Abnormal; Notable for the following components:   Glucose, Bld 142 (*)    BUN 31 (*)    Creatinine, Ser 1.30 (*)    Calcium 10.5 (*)    Albumin 3.2 (*)    GFR, Estimated 45 (*)    All other components within normal limits  D-DIMER, QUANTITATIVE - Abnormal; Notable for the following components:   D-Dimer, Quant 3.78 (*)    All other components within normal limits  CBG MONITORING, ED - Abnormal; Notable for the following components:   Glucose-Capillary 52 (*)    All other components within normal limits  RESP PANEL BY RT-PCR (FLU A&B, COVID) ARPGX2  BRAIN NATRIURETIC PEPTIDE  TROPONIN I (HIGH SENSITIVITY)  TROPONIN I (HIGH SENSITIVITY)    EKG EKG Interpretation  Date/Time:  Saturday May 22 2021 14:49:58 EDT Ventricular Rate:  71 PR Interval:  180 QRS Duration: 126 QT Interval:  398 QTC Calculation: 433  R Axis:   -68 Text Interpretation: Sinus rhythm Left bundle branch block Confirmed by Thamas Jaegers ([id number]) on 05/22/2021 2:53:30 PM  Radiology CT Angio Chest Pulmonary Embolism (PE) W or WO Contrast  Result Date: 05/22/2021 CLINICAL DATA:  PE suspected, low/intermediate prob, positive D-dimer. Chest pain, dyspnea. EXAM: CT ANGIOGRAPHY CHEST WITH CONTRAST TECHNIQUE: Multidetector CT imaging of the chest was performed using the standard protocol during bolus administration of intravenous contrast. Multiplanar CT image reconstructions and MIPs  were obtained to evaluate the vascular anatomy. CONTRAST:  89m OMNIPAQUE IOHEXOL 350 MG/ML SOLN COMPARISON:  10/27/2020 FINDINGS: Cardiovascular: There is adequate opacification of the pulmonary arterial tree. There is no intraluminal filling defect identified to suggest acute pulmonary embolism. The central pulmonary arteries are of normal caliber. Mild coronary artery calcification. Global cardiac size within normal limits. No pericardial effusion. The thoracic aorta is of normal caliber. Mediastinum/Nodes: No enlarged mediastinal, hilar, or axillary lymph nodes. Thyroid gland, trachea, and esophagus demonstrate no significant findings. Lungs/Pleura: Benign calcified granuloma within the a left upper lobe. The lungs are otherwise clear. No pneumothorax or pleural effusion. Central airways are widely patent. Upper Abdomen: No acute abnormality. Musculoskeletal: No acute bone abnormality. No lytic or blastic bone lesion. Interval left mastectomy and axillary lymph node dissection. A relatively thick walled loculated subcutaneous fluid collection is seen within the resection bed measuring 2.7 x 9.6 cm possibly representing a postoperative seroma or chronic hematoma. Super infection is not excluded on this examination. Mild subcutaneous infiltration within this region and associated dermal thickening may be postoperative in nature or inflammatory. Review of the MIP images confirms the above findings. IMPRESSION: No pulmonary embolism. Interval left mastectomy and axillary lymph node dissection. 9.6 cm subcutaneous, thick walled fluid collection within the a resection bed may represent a postoperative seroma or chronic hematoma. Superinfection, however, is not excluded on this examination. Surrounding inflammatory changes within the subcutaneous soft tissues and associated dermal thickening may be inflammatory or postoperative in nature. Correlation with clinical examination is recommended. Mild coronary artery  calcification. Electronically Signed   By: AFidela SalisburyM.D.   On: 05/22/2021 21:43   DG Chest Port 1 View  Result Date: 05/22/2021 CLINICAL DATA:  Chest pain and shortness of breath. EXAM: PORTABLE CHEST 1 VIEW COMPARISON:  October 26, 2020. FINDINGS: Right chest wall Port-A-Cath with tip at the superior cavoatrial junction. Left axillary surgical clips. The heart size and mediastinal contours are unchanged. No focal consolidation. No pleural effusion. No pneumothorax. Degenerative changes bilateral shoulders. Thoracic spondylosis. IMPRESSION: No active cardiopulmonary disease. Electronically Signed   By: JDahlia BailiffM.D.   On: 05/22/2021 16:09    Procedures Procedures   Medications Ordered in ED Medications  aspirin chewable tablet 324 mg (324 mg Oral Given by EMS 05/22/21 1638)  lactated ringers bolus 500 mL (0 mLs Intravenous Stopped 05/22/21 1938)  acetaminophen (TYLENOL) tablet 1,000 mg (1,000 mg Oral Given 05/22/21 1823)  iohexol (OMNIPAQUE) 350 MG/ML injection 50 mL (50 mLs Intravenous Contrast Given 05/22/21 2131)    ED Course  I have reviewed the triage vital signs and the nursing notes.  Pertinent labs & imaging results that were available during my care of the patient were reviewed by me and considered in my medical decision making (see chart for details).    MDM Rules/Calculators/A&P                           70y.o. female with past medical history  as above who presents for evaluation of a constellation of symptoms including generalized malaise, poor appetite, dyspnea, and chest pain.  Afebrile and hemodynamically stable here.  She appears mildly dehydrated on exam with dry mucous membranes.  Broad work-up initiated.  Labs notable for stable noncritical anemia with hemoglobin 11.1, stable creatinine of 1.3, normal BNP, normal serial troponins, and elevated D-dimer.  EKG is NSR with no evidence of ischemia or arrhythmia.  Chest x-ray with no acute cardiopulmonary abnormality.   Given negative serial troponins and normal EKG as well as atypical symptoms, I have low suspicion for ACS.  No evidence of pneumonia or pneumothorax.  Given elevated D-dimer, CTA chest was obtained to rule out pulmonary embolism.  CTA with no evidence of pulmonary embolism; however, a fluid collection within the resection bed of the left mastectomy is present.  It was felt to represent seroma versus hematoma, with abscess less likely.  This is likely the source of her chest discomfort.  Given absence of infection on external examination, afebrile state, absence of leukocytosis and left shift, I have low suspicion for postoperative abscess at this time.  Given stable noncritical anemia, I have low suspicion for hematoma as well.  I believe that this finding is most consistent with seroma.  These findings were discussed at length with the patient and her family at bedside, voiced understanding.  Patient was hydrated with LR, with improvement of symptoms.  Patient will follow-up with both PCP and breast surgeon in the outpatient setting.    Final Clinical Impression(s) / ED Diagnoses Final diagnoses:  Malaise    Rx / DC Orders ED Discharge Orders     None        Violet Baldy, MD 05/24/21 [id number]    Lucrezia Starch, MD 05/25/21 2245

## 2021-05-25 ENCOUNTER — Telehealth: Payer: Self-pay

## 2021-05-25 NOTE — Telephone Encounter (Signed)
This nurse spoke with patient and made aware of scheduled appointment for MR of Brain on Friday  9/9 At 1pm.  Advised patient that she will need to be her by 1230pm.  Patient stated that she can be at the appointment but will need to schedule a pickup with [facility].  Patient states that she is already setup for the services, request this nurse to send a message for her.  This nurse sent message to reach out to patient to schedule pick up.  Patient acknowledged understanding.  No further questions or concerns at this time.

## 2021-05-27 ENCOUNTER — Telehealth: Payer: Self-pay

## 2021-05-27 ENCOUNTER — Encounter: Payer: Self-pay | Admitting: Hematology

## 2021-05-27 ENCOUNTER — Inpatient Hospital Stay: Payer: Medicare HMO

## 2021-05-27 ENCOUNTER — Encounter: Payer: Self-pay | Admitting: *Deleted

## 2021-05-27 ENCOUNTER — Inpatient Hospital Stay: Payer: Medicare HMO | Attending: Nurse Practitioner | Admitting: Hematology

## 2021-05-27 ENCOUNTER — Other Ambulatory Visit: Payer: Self-pay

## 2021-05-27 VITALS — BP 105/66 | HR 70 | Temp 98.3°F | Resp 20 | Ht 63.0 in | Wt 202.1 lb

## 2021-05-27 DIAGNOSIS — Z170 Estrogen receptor positive status [ER+]: Secondary | ICD-10-CM | POA: Insufficient documentation

## 2021-05-27 DIAGNOSIS — Z8616 Personal history of COVID-19: Secondary | ICD-10-CM | POA: Insufficient documentation

## 2021-05-27 DIAGNOSIS — C50412 Malignant neoplasm of upper-outer quadrant of left female breast: Secondary | ICD-10-CM | POA: Diagnosis not present

## 2021-05-27 DIAGNOSIS — R251 Tremor, unspecified: Secondary | ICD-10-CM | POA: Insufficient documentation

## 2021-05-27 DIAGNOSIS — R1010 Upper abdominal pain, unspecified: Secondary | ICD-10-CM | POA: Insufficient documentation

## 2021-05-27 DIAGNOSIS — G20 Parkinson's disease: Secondary | ICD-10-CM | POA: Insufficient documentation

## 2021-05-27 DIAGNOSIS — Z794 Long term (current) use of insulin: Secondary | ICD-10-CM | POA: Insufficient documentation

## 2021-05-27 DIAGNOSIS — I509 Heart failure, unspecified: Secondary | ICD-10-CM | POA: Diagnosis not present

## 2021-05-27 DIAGNOSIS — Z79899 Other long term (current) drug therapy: Secondary | ICD-10-CM | POA: Insufficient documentation

## 2021-05-27 DIAGNOSIS — I110 Hypertensive heart disease with heart failure: Secondary | ICD-10-CM | POA: Insufficient documentation

## 2021-05-27 DIAGNOSIS — Z9181 History of falling: Secondary | ICD-10-CM | POA: Insufficient documentation

## 2021-05-27 DIAGNOSIS — R112 Nausea with vomiting, unspecified: Secondary | ICD-10-CM | POA: Insufficient documentation

## 2021-05-27 DIAGNOSIS — E1140 Type 2 diabetes mellitus with diabetic neuropathy, unspecified: Secondary | ICD-10-CM | POA: Diagnosis not present

## 2021-05-27 DIAGNOSIS — G620 Drug-induced polyneuropathy: Secondary | ICD-10-CM | POA: Diagnosis not present

## 2021-05-27 DIAGNOSIS — T451X5A Adverse effect of antineoplastic and immunosuppressive drugs, initial encounter: Secondary | ICD-10-CM | POA: Diagnosis not present

## 2021-05-27 DIAGNOSIS — Z95828 Presence of other vascular implants and grafts: Secondary | ICD-10-CM

## 2021-05-27 LAB — CMP (CANCER CENTER ONLY)
ALT: 15 U/L (ref 0–44)
AST: 11 U/L — ABNORMAL LOW (ref 15–41)
Albumin: 3.5 g/dL (ref 3.5–5.0)
Alkaline Phosphatase: 71 U/L (ref 38–126)
Anion gap: 12 (ref 5–15)
BUN: 30 mg/dL — ABNORMAL HIGH (ref 8–23)
CO2: 29 mmol/L (ref 22–32)
Calcium: 11 mg/dL — ABNORMAL HIGH (ref 8.9–10.3)
Chloride: 100 mmol/L (ref 98–111)
Creatinine: 1.41 mg/dL — ABNORMAL HIGH (ref 0.44–1.00)
GFR, Estimated: 41 mL/min — ABNORMAL LOW (ref >60)
Glucose, Bld: 236 mg/dL — ABNORMAL HIGH (ref 70–99)
Potassium: 3.8 mmol/L (ref 3.5–5.1)
Sodium: 141 mmol/L (ref 135–145)
Total Bilirubin: 0.4 mg/dL (ref 0.3–1.2)
Total Protein: 7.5 g/dL (ref 6.5–8.1)

## 2021-05-27 LAB — CBC WITH DIFFERENTIAL (CANCER CENTER ONLY)
Abs Immature Granulocytes: 0.05 10*3/uL (ref 0.00–0.07)
Basophils Absolute: 0 10*3/uL (ref 0.0–0.1)
Basophils Relative: 1 %
Eosinophils Absolute: 0.2 10*3/uL (ref 0.0–0.5)
Eosinophils Relative: 3 %
HCT: 34 % — ABNORMAL LOW (ref 36.0–46.0)
Hemoglobin: 10.8 g/dL — ABNORMAL LOW (ref 12.0–15.0)
Immature Granulocytes: 1 %
Lymphocytes Relative: 16 %
Lymphs Abs: 0.9 10*3/uL (ref 0.7–4.0)
MCH: 28.1 pg (ref 26.0–34.0)
MCHC: 31.8 g/dL (ref 30.0–36.0)
MCV: 88.3 fL (ref 80.0–100.0)
Monocytes Absolute: 0.3 10*3/uL (ref 0.1–1.0)
Monocytes Relative: 5 %
Neutro Abs: 4.2 10*3/uL (ref 1.7–7.7)
Neutrophils Relative %: 74 %
Platelet Count: 411 10*3/uL — ABNORMAL HIGH (ref 150–400)
RBC: 3.85 MIL/uL — ABNORMAL LOW (ref 3.87–5.11)
RDW: 12.8 % (ref 11.5–15.5)
WBC Count: 5.7 10*3/uL (ref 4.0–10.5)
nRBC: 0 % (ref 0.0–0.2)

## 2021-05-27 MED ORDER — SODIUM CHLORIDE 0.9% FLUSH: 10.0000 mL | Freq: Once | INTRAVENOUS | Status: DC

## 2021-05-27 NOTE — Telephone Encounter (Signed)
This nurse spoke with patient who stated that she is still having some weakness, nausea and decreased appetite. Patient stated that she will still come to her appointments today however, she would like to hold her Chemo today and just get some fluids to see if that will help her feel better.  This nurse informed her that this information will be passed on to the provider.  No further questions or concerns at this time.

## 2021-05-27 NOTE — Progress Notes (Addendum)
[place]   Telephone:([phone]) [phone] [phone]) [phone]   Clinic Follow up Note   Patient Care Team: [name], PA-C as PCP - General (Internal Medicine) [name], RN as Oncology Nurse Navigator [name], RN as Oncology Nurse Navigator [name], MD as Consulting Physician (General Surgery) [name], MD as Consulting Physician (Hematology) [name], MD as Consulting Physician (Radiation Oncology) [name], [name], MD as Consulting Physician (Cardiology)  Date of Service:  05/27/2021  CHIEF COMPLAINT: f/u of left breast cancer  CURRENT THERAPY:  -Herceptin, every 3 weeks -Perjeta added back on 05/06/21, stopped after one dose  -Exemestane daily  ASSESSMENT & PLAN:  Cheryl Blankenship is a 70 y.o. female with   1. Symptom management: body pain, fatigue, weakness, chills, syncope and falls  -these symptoms initially began prior to starting Perjeta but have continued and progressively worsened since being on Perjeta. -she passed out at church the weekend before beginning [place]. -she now has vomiting and some upper stomach pain. -I do not feel she needs IVF today. -She is scheduled for brain MRI tomorrow, 05/28/21. I will call them on Tuesday to discuss the results.  2. Malignant neoplasm of upper-outer quadrant of left breast, Stage II, c(T2N1M0), ER+/PR+/HER2+, Grade II, ypT1aN0 -She was diagnosed in 06/2020. She has 12cm calcification and 4.5cm mass in her left breast with 2 abnormal LN. 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  -Workup showed no evidence of distant disease -She started neoadjuvant chemo THP q3weeks on 08/05/20. Due to poor toleration after C1, she was switched to [employer] on 09/04/20. C2 postponed due to COVID (+) in early 09/2020. Due to poor toleration of C2 Kadcyla, chemo discontinued after cycle 2.  -She was restarted on Kanjinti on 12/10/20, to be given to  complete a year -Exemestane started 12/10/20, she is tolerating well  -She underwent left mastectomy on 12/30/20 under Dr. [name]. Pathology showed: focal residual invasive ductal carcinoma s/p neoadjuvant treatment; residual DCIS; margins of resection not involved; all 5 lymph nodes negative (0/5).  -Perjeta was added to kanjinti on 05/06/21. She tolerated poorly, though with some symptoms possibly present prior to treatment. -given the possible side effects from Perjeta, we will discontinue this. She was tolerating Kanjinti well, so we can discuss going back to that when she feels better   3. Postoperative seroma or hematoma -present in left axilla, seen on chest CT 05/22/21. Palpable area of fluid on exam today. -I advised that if it is bothersome, she can go back to Dr. [name] to have it aspirated/drained.   4. Bone Health -She has not had DEXA done before. [facility]. -I discussed that AI can decrease her bone density. We will monitor on exemestane.   5. Comorbidities: CHF, poorly controlled DM with neuropathy, HTN, Arthritis in hips -Due to Nausea and stomach pain from Jardiance, she takes Zofran as needed.  -she is on Cymbalta and Neurontin for diabetic neuropathy -Continue medications and f/u with PCP and Cardiologist Dr. Marisue Ivan.  -She had 2 falls in 2021 due to multiple medications and poor sleep. She ambulates with cane.  -Pt is on Lasix which she notes leads to urinary incontinence. She wears depends for this. I discussed Lasix with her and advised her to talk to her cardiologist about if she needs to continue this. -Her baseline ECHO from 07/31/20 was normal, repeat 03/05/21 was stable.   6. Genetic testing, she  agreed to proceed but has not proceeded with testing yet.    7. Social Support  -She does have decreased memory, she is on medication. Her family help her with history.  -She lives in [place] alone in an apartment with other older women. She  is currently able to take care of herself independently. Her daughter is in town and her son is in [place]. She has other non-biological children. She lives with her husband     63. COVID (+) in early 09/2020. She was asymptomatic and did not require treatment. She tested negative in later 09/2020. She has recovered well      PLAN:  -continue exemestane -proceed with brain MRI tomorrow, 05/28/21 -change 06/01/21 appointment to a phone visit, cancel lab and flush   No problem-specific Assessment & Plan notes found for this encounter.   SUMMARY OF ONCOLOGIC HISTORY: Oncology History Overview Note  Cancer Staging Malignant neoplasm of upper-outer quadrant of left breast in female, estrogen receptor positive ([place]) Staging form: Breast, AJCC 8th Edition - Clinical stage from 07/16/2020: Stage IB (cT2, cN1, cM0, G2, ER+, PR+, HER2+) - Signed by [name], MD on 07/21/2020    Malignant neoplasm of upper-outer quadrant of left breast in female, estrogen receptor positive ([place])  07/01/2020 Mammogram   IMPRESSION: 1. There is a 12 cm span of highly suspicious calcifications involving the upper outer and upper inner quadrants of the left breast. On ultrasound, within this area of calcifications, there are confluent masses at 1 o'clock spanning at least 4.5 cm. An additional suspicious mass is seen in the left breast at 2 o'clock.  -Ultrasound targeted to the left breast at 1 o'clock, 5 cm from the nipple demonstrates a broad area of hypoechoic irregular tissue/confluent masses spanning at least 4.5 cm. There is a separate oval hypoechoic mass with indistinct margins at 2 o'clock, 7 cm from the nipple measuring 0.9 x 0.4 x 0.9 cm. Ultrasound of the left axilla demonstrates 2 abnormally thickened lymph nodes with cortices measuring 6-7 mm. There is an additional lymph node with a borderline cortex of 4 mm.   2. There are 2 lymph nodes with thickened cortices in the left axilla, and an additional  lymph node with a borderline thickened cortex.   07/16/2020 Cancer Staging   Staging form: Breast, AJCC 8th Edition - Clinical stage from 07/16/2020: Stage IIA (cT3, cN1, cM0, G2, ER+, PR+, HER2+) - Signed by [name], MD on 08/05/2020   07/16/2020 Initial Biopsy   Diagnosis 1. 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 Breast, left, needle core biopsy, 1 o'clock - INVASIVE DUCTAL CARCINOMA - SEE COMMENT 3. Lymph node, biopsy - METASTATIC CARCINOMA INVOLVING A LYMPH NODE - SEE COMMENT Microscopic Comment 1. Based on the biopsy, the ductal carcinoma in situ has a comedo pattern, high nuclear grade and measures 0.7 cm in greatest linear extent. Prognostic markers (ER/PR) are pending and will be reported in an addendum. 2. Based on the biopsy, the carcinoma appears Nottingham grade 2 of 3 and measures 1.5 cm in greatest linear extent. Prognostic markers (ER/PR/ki-67/HER2) are pending and will be reported in an addendum. Dr. Saralyn Pilar reviewed the case and agrees with the above diagnosis. These results were called to [facility] on July 17, 2020. 3. Based on the biopsy, the carcinoma appears measures 1.0 cm in greatest linear extent. Prognostic markers (ER/PR/ki-67/HER2) are pending and will be  reported in an addendum.   07/16/2020 Receptors her2   1. PROGNOSTIC INDICATORS Results: IMMUNOHISTOCHEMICAL AND MORPHOMETRIC ANALYSIS PERFORMED MANUALLY Estrogen Receptor: 30%, POSITIVE, STRONG STAINING INTENSITY Progesterone Receptor: 5%, POSITIVE, STRONG STAINING INTENSITY  2. PROGNOSTIC INDICATORS Results: IMMUNOHISTOCHEMICAL AND MORPHOMETRIC ANALYSIS PERFORMED MANUALLY The tumor cells are POSITIVE for Her2 (3+). Estrogen Receptor: 85%, POSITIVE, STRONG STAINING INTENSITY Progesterone Receptor: 60%, POSITIVE, STRONG STAINING INTENSITY Proliferation Marker  Ki67: 25%  3. PROGNOSTIC INDICATORS Results: IMMUNOHISTOCHEMICAL AND MORPHOMETRIC ANALYSIS PERFORMED MANUALLY The tumor cells are POSITIVE for Her2 (3+). Estrogen Receptor: 95%, POSITIVE, STRONG STAINING INTENSITY Progesterone Receptor: 45%, POSITIVE, STRONG STAINING INTENSITY Proliferation Marker Ki67: 30%   07/20/2020 Initial Diagnosis   Malignant neoplasm of upper-outer quadrant of left breast in female, estrogen receptor positive ([place])   07/31/2020 Echocardiogram   Baseline Echo  IMPRESSIONS     1. Left ventricular ejection fraction, by estimation, is 65 to 70%. The  left ventricle has normal function. The left ventricle has no regional  wall motion abnormalities. Left ventricular diastolic parameters are  indeterminate. The average left  ventricular global longitudinal strain is -17.1 %.   2. Right ventricular systolic function is normal. The right ventricular  size is normal. There is normal pulmonary artery systolic pressure.   3. The mitral valve is normal in structure. Trivial mitral valve  regurgitation. No evidence of mitral stenosis.   4. The aortic valve is normal in structure. Aortic valve regurgitation is  not visualized. No aortic stenosis is present.    07/31/2020 Breast MRI   IMPRESSION: 1. Extensive non mass enhancement within the left breast extending from the nipple to the chest wall. Overall findings are most compatible with extensive malignancy involving the majority of the left breast. 2. Indeterminate enhancing mass within the upper inner right breast. 3. Three morphologically abnormal lymph nodes within the left axilla. One of these nodes has been biopsied compatible with metastatic adenopathy.     08/03/2020 Imaging   Bone Scan whole body  IMPRESSION: Increased radiotracer uptake in the mid to lower lumbar regions of uncertain etiology. Correlation with radiography to assess for potential arthropathy in these areas advised. Increased uptake  in major joints is likely of arthropathic etiology. Increased uptake in the mid face is likely due to paranasal sinus disease. Distribution of radiotracer uptake in bony structures elsewhere unremarkable.   08/04/2020 Procedure   PAC placement by Dr [name]   08/05/2020 - 10/28/2020 Neo-Adjuvant Chemotherapy   Neoadjuvant THP q3weeks for 4-6 cycles starting 08/05/20, followed by Herceptin or Kadcyla q3weeks to complete 1 year of treatment. Stopped THP after 1 dose due to poor toleration       -I switched her to [place] starting 08/25/20. Stopped after C2 due to poor toleration (10/28/20)   08/07/2020 Imaging   CT CAP  IMPRESSION: 2.7 cm mass in the upper outer left breast, likely corresponding to the patient's known primary breast neoplasm.   8 mm short axis left axillary node, suspicious for nodal metastasis in this clinical context.   No findings suspicious for distant metastasis.   Endometrium is mildly prominent, measuring 11 mm. Correlate for vaginal bleeding and consider pelvic ultrasound for further evaluation if clinically warranted.   08/11/2020 Pathology Results   Diagnosis Breast, right, needle core biopsy, right breast - PAPILLARY LESION - SEE COMMENT Microscopic Comment The biopsy consists of a papillary lesion with a focal area of adenosis. Immunohistochemistry (SMM, calponin and p63) shows retention of the myoepithelial cell layer in the focus of adenosis.  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 These results were called to [facility] on August 12, 2020.   10/27/2020 Imaging   CT Angio chest  IMPRESSION: Slightly suboptimal opacification of the main pulmonary artery, however no central or proximal segmental pulmonary embolism   Minimal bibasilar atelectasis   Aortic Atherosclerosis (ICD10-I70.0).   10/31/2020 Imaging   CT AP  IMPRESSION: 1. No acute finding by CT other than a large amount of stool and  gas in the right colon and gas in the transverse colon. No small bowel obstruction. 2. Previous cholecystectomy. 3. Aortic atherosclerosis. 4. Umbilical hernia containing only fat. 5. Small amount of fluid in the endometrial canal as seen previously.   Aortic Atherosclerosis (ICD10-I70.0).   12/04/2020 Imaging   MRI Breast IMPRESSION: 1. Evidence of a positive response to interval treatment. There has been a significant improvement of both the extent and strength of the enhancement throughout the outer LEFT breast when compared to the earlier MRI of 07/31/2020. 2. However, there remains scattered areas of mass and non-mass enhancement throughout the outer LEFT breast, involving the upper outer quadrant and lower outer quadrant, as detailed above. 3. The 3 morphologically abnormal lymph nodes in the LEFT axilla identified on previous MRI, 1 a biopsy-proven metastasis, are all slightly smaller indicating an additional positive response to interval treatment. 4. No evidence of malignancy within the RIGHT breast.   12/10/2020 -  Chemotherapy    Patient is on Treatment Plan: BREAST TRASTUZUMAB + PERJETA (05/06/21) Q21D       12/30/2020 Surgery   A. BREAST, LEFT, MASTECTOMY:  - Focal residual invasive ductal carcinoma status post neoadjuvant  treatment.  - Residual ductal carcinoma in situ.  - Margins of resection are not involved.  - Biopsy clip (X2).   B. LYMPH NODES, LEFT #1, DISSECTION:  - Three lymph nodes, negative for carcinoma (0/3).  - Biopsy clip (X1).   C. LYMPH NODES, LEFT #2, DISSECTION:  - Two lymph nodes, negative for carcinoma (0/2).   PROGNOSTIC INDICATOR RESULTS:  - The tumor cells are POSITIVE for Her2 (3+).  - Estrogen Receptor:       POSITIVE, 90%, STRONG STAINING  - Progesterone Receptor:   POSITIVE, 50%, MODERATE STAINING    12/30/2020 Cancer Staging   Staging form: Breast, AJCC 8th Edition - Pathologic stage from 12/30/2020: No Stage Recommended  (ypT1a, pN0, cM0, G2, ER+, PR+, HER2+) - Signed by [name], MD on 01/20/2021 Stage prefix: Post-therapy Histologic grading system: 3 grade system Residual tumor (R): R0 - None   02/17/2021 - 04/08/2021 Radiation Therapy    Site Technique Total Dose (Gy) Dose per Fx (Gy) Completed Fx Beam Energies  Chest Wall, Left: CW_Lt 3D 50.4/50.4 1.8 28/28 10X  Chest Wall, Left: CW_Lt_SCLV 3D 50.4/50.4 1.8 28/28 6X, 15X  Chest Wall, Left: CW_Lt_Bst Electron 10/10 2 5/5 9E       INTERVAL HISTORY:  Maryetta Shafer is here for a follow up of breast cancer. She was last seen by me on 04/16/21, and we spoke via telephone on 05/19/21. She presents to the clinic accompanied by her daughter. She fell on her way in today. She had been on our transportation [place], was starting to walk with her walker, then fell. She is unsure what caused the fall. She denies dropping things. She notes she is mostly able to do things for herself. Her daughter notes there is a chair in the shower, and she needs assistance getting  out of the shower.  She reports she has no appetite and has been vomiting. Her daughter notes Deni complains of upper stomach pain occasionally. She notes continued tingling from her diabetic neuropathy, as well as leg pain. She notes specific pain in her groin/inner thigh area. She has a postoperative seroma or chronic hematoma, seen on angio chest CT. She reports this hurts her sometimes. Her hand has a tremor today. Her daughter notes her lip will tremor as well.   All other systems were reviewed with the patient and are negative.  MEDICAL HISTORY:  Past Medical History:  Diagnosis Date   Anxiety    Breast cancer ([place])    CHF (congestive heart failure) ([place])    Depression    Diabetes mellitus without complication ([place])    type 2   GERD (gastroesophageal reflux disease)    History of kidney stones    Hypertension    PONV (postoperative nausea and vomiting)    Stroke ([place])     SURGICAL  HISTORY: Past Surgical History:  Procedure Laterality Date   APPENDECTOMY     CHOLECYSTECTOMY     COLONOSCOPY     ESOPHAGOGASTRODUODENOSCOPY (EGD) WITH PROPOFOL N/A 11/01/2020   Procedure: ESOPHAGOGASTRODUODENOSCOPY (EGD) WITH PROPOFOL;  Surgeon: Arta Silence, MD;  Location: [facility];  Service: Endoscopy;  Laterality: N/A;   EYE SURGERY     cataracts   MASTECTOMY WITH AXILLARY LYMPH NODE DISSECTION Left 12/30/2020   Procedure: LEFT MASTECTOMY WITH TARGETED AXILLARY LYMPH NODE DISSECTION;  Surgeon: [name], MD;  Location: [facility];  Service: General;  Laterality: Left;   MULTIPLE TOOTH EXTRACTIONS     PORTACATH PLACEMENT N/A 08/04/2020   Procedure: INSERTION PORT-A-CATH WITH ULTRASOUND GUIDANCE;  Surgeon: [name], MD;  Location: [facility];  Service: General;  Laterality: N/A;   STOMACH SURGERY      I have reviewed the social history and family history with the patient and they are unchanged from previous note.  ALLERGIES:  is allergic to coconut (cocos nucifera) allergy skin test, hydrochlorothiazide, lisinopril, and coconut oil.  MEDICATIONS:  Current Outpatient Medications  Medication Sig Dispense Refill   Accu-Chek FastClix Lancets MISC 1 each by Other route in the morning, at noon, and at bedtime.     albuterol (VENTOLIN HFA) 108 (90 Base) MCG/ACT inhaler Inhale 1-2 puffs into the lungs every 4 (four) hours as needed for wheezing or shortness of breath. 8 g 0   ALPRAZolam (XANAX) 0.25 MG tablet Take 1-2 tablets (0.25-0.5 mg total) by mouth 3 (three) times daily as needed for anxiety or sleep. (Patient not taking: Reported on 05/22/2021) 30 tablet 0   amLODipine (NORVASC) 10 MG tablet Take 1 tablet (10 mg total) by mouth daily. 30 tablet 10   aspirin 81 MG chewable tablet Chew 81 mg by mouth daily.      bisacodyl (DULCOLAX) 5 MG EC tablet Take 1 tablet (5 mg total) by mouth daily as needed for moderate constipation. (Patient not taking: No sig reported) 30 tablet 0    carvedilol (COREG) 25 MG tablet Take 1 tablet (25 mg total) by mouth 2 (two) times daily with a meal. 180 tablet 3   Cholecalciferol 25 MCG (1000 UT) capsule Take 1,000 Units by mouth daily.     diphenoxylate-atropine (LOMOTIL) 2.5-0.025 MG tablet TAKE 1 OR 2 TABLETS BY MOUTH 4 TIMES DAILY AS NEEDED FOR DIARRHEA OR LOOSE STOOLS (Patient taking differently: Take 1-2 tablets by mouth 4 (four) times daily as needed for diarrhea or loose  stools.) 60 tablet 2   DULoxetine (CYMBALTA) 60 MG capsule Take 60 mg by mouth daily.     exemestane (AROMASIN) 25 MG tablet Take 1 tablet (25 mg total) by mouth daily after breakfast. 90 tablet 2   folic acid (FOLVITE) 1 MG tablet Take 1 mg by mouth daily.     gabapentin (NEURONTIN) 400 MG capsule Take 400 mg by mouth 3 (three) times daily.      glipiZIDE (GLUCOTROL) 10 MG tablet Take 10 mg by mouth daily before breakfast.     insulin aspart (NOVOLOG) 100 UNIT/ML injection Inject 2-10 Units into the skin 3 (three) times daily before meals. Per sliding scale:  150-199 = 2 units, 200-249 = 4 units, 250-299 = 6 units, 300-349 = 8 units, greater than350 = 10 units.     insulin glargine (LANTUS SOLOSTAR) 100 UNIT/ML Solostar Pen Inject 35 Units into the skin 2 (two) times daily. (Patient taking differently: Inject 70 Units into the skin 2 (two) times daily. Pt takes 70 units in the morning and 70 units at night.) 15 mL 11   lidocaine-prilocaine (EMLA) cream Apply 1 application topically as needed (pain). (Patient taking differently: Apply 1 application topically daily.) 30 g 3   losartan (COZAAR) 100 MG tablet TAKE 1 TABLET BY MOUTH EVERY DAY (Patient taking differently: Take 100 mg by mouth daily.) 90 tablet 1   metFORMIN (GLUCOPHAGE) 500 MG tablet Take 500 mg by mouth 2 (two) times daily with a meal.      methocarbamol (ROBAXIN) 500 MG tablet Take 500 mg by mouth every 8 (eight) hours as needed for muscle spasms.     Omega-3 Fatty Acids (FISH OIL) 1000 MG CAPS Take 1,000 mg  by mouth daily.     omeprazole (PRILOSEC) 20 MG capsule Take 1 capsule (20 mg total) by mouth daily. 30 capsule 11   ondansetron (ZOFRAN-ODT) 4 MG disintegrating tablet Take 1 tablet (4 mg total) by mouth 2 (two) times daily as needed for up to 30 doses for nausea or vomiting. 30 tablet 0   potassium chloride (KLOR-CON) 10 MEQ tablet Take 20 mEq by mouth daily.     prochlorperazine (COMPAZINE) 10 MG tablet Take 1 tablet (10 mg total) by mouth every 6 (six) hours as needed for nausea or vomiting. 30 tablet 5   promethazine (PHENERGAN) 25 MG suppository Place 1 suppository (25 mg total) rectally every 8 (eight) hours as needed for nausea or vomiting. (Patient not taking: No sig reported) 30 each 1   RESTASIS 0.05 % ophthalmic emulsion Place 1 drop into both eyes 2 (two) times daily as needed (dry eyes).     spironolactone (ALDACTONE) 25 MG tablet TAKE 1 TABLET BY MOUTH EVERY DAY (Patient taking differently: Take 25 mg by mouth daily.) 90 tablet 1   sucralfate (CARAFATE) 1 GM/10ML suspension Take 10 mLs (1 g total) by mouth 4 (four) times daily -  with meals and at bedtime. (Patient not taking: No sig reported) 420 mL 0   vitamin B-12 (CYANOCOBALAMIN) 1000 MCG tablet Take 1,000 mcg by mouth daily.     zolpidem (AMBIEN) 5 MG tablet Take 1 tablet (5 mg total) by mouth at bedtime as needed for sleep. 20 tablet 0   No current facility-administered medications for this visit.    PHYSICAL EXAMINATION: ECOG PERFORMANCE STATUS: 3 - Symptomatic, >50% confined to bed  Vitals:   05/27/21 1345  BP: 105/66  Pulse: 70  Resp: 20  Temp: 98.3 F (36.8 C)  SpO2: 99%   Wt Readings from Last 3 Encounters:  05/27/21 202 lb 1.6 oz (91.7 kg)  05/22/21 196 lb (88.9 kg)  05/06/21 206 lb (93.4 kg)     GENERAL:alert, no distress and comfortable SKIN: skin color, texture, turgor are normal, no rashes or significant lesions EYES: normal, Conjunctiva are pink and non-injected, sclera clear NEURO: alert & oriented  x 3 with fluent speech, no focal motor/sensory deficits BREAST: (+) fluid collection in left lateral chest and axilla from mastectomy. No palpable mass, nodules or adenopathy bilaterally. Breast exam benign.   LABORATORY DATA:  I have reviewed the data as listed CBC Latest Ref Rng & Units 05/27/2021 05/22/2021 05/06/2021  WBC 4.0 - 10.5 K/uL 5.7 4.3 4.5  Hemoglobin 12.0 - 15.0 g/dL 10.8(L) 11.1(L) 11.4(L)  Hematocrit 36.0 - 46.0 % 34.0(L) 35.2(L) 35.3(L)  Platelets 150 - 400 K/uL 411(H) 399 265     CMP Latest Ref Rng & Units 05/27/2021 05/22/2021 05/06/2021  Glucose 70 - 99 mg/dL 236(H) 142(H) 147(H)  BUN 8 - 23 mg/dL 30(H) 31(H) 20  Creatinine 0.44 - 1.00 mg/dL 1.41(H) 1.30(H) 1.21(H)  Sodium 135 - 145 mmol/L 141 140 142  Potassium 3.5 - 5.1 mmol/L 3.8 4.2 4.6  Chloride 98 - 111 mmol/L 100 100 104  CO2 22 - 32 mmol/L 29 30 29   Calcium 8.9 - 10.3 mg/dL 11.0(H) 10.5(H) 11.3(H)  Total Protein 6.5 - 8.1 g/dL 7.5 7.2 7.5  Total Bilirubin 0.3 - 1.2 mg/dL 0.4 0.6 0.5  Alkaline Phos 38 - 126 U/L 71 60 75  AST 15 - 41 U/L 11(L) 18 17  ALT 0 - 44 U/L 15 18 16       RADIOGRAPHIC STUDIES: I have personally reviewed the radiological images as listed and agreed with the findings in the report. No results found.    No orders of the defined types were placed in this encounter.  All questions were answered. The patient knows to call the clinic with any problems, questions or concerns. No barriers to learning was detected. The total time spent in the appointment was 30 minutes.     [name], MD 05/27/2021   I, Wilburn Mylar, am acting as scribe for [name], MD.   I have reviewed the above documentation for accuracy and completeness, and I agree with the above.

## 2021-05-28 ENCOUNTER — Ambulatory Visit (HOSPITAL_COMMUNITY)
Admission: RE | Admit: 2021-05-28 | Discharge: 2021-05-28 | Disposition: A | Discharge status: Home / Self Care | Payer: Medicare HMO | Source: Ambulatory Visit | Attending: Hematology | Admitting: Hematology

## 2021-05-28 DIAGNOSIS — Z170 Estrogen receptor positive status [ER+]: Secondary | ICD-10-CM | POA: Insufficient documentation

## 2021-05-28 DIAGNOSIS — C50412 Malignant neoplasm of upper-outer quadrant of left female breast: Secondary | ICD-10-CM | POA: Diagnosis present

## 2021-05-28 LAB — PTH, INTACT AND CALCIUM
Calcium, Total (PTH): 10.6 mg/dL — ABNORMAL HIGH (ref 8.7–10.3)
PTH: 136 pg/mL — ABNORMAL HIGH (ref 15–65)

## 2021-05-28 IMAGING — MR MR HEAD WO/W CM
13 series · 48 of 48 positions shown · IV contrast (gadavist)
Comparison: No pertinent prior exams available for comparison.

CLINICAL DATA: Malignant neoplasm of upper-outer quadrant of left
breast in female, estrogen receptor positive (HCC) C50.412, [J4]
([J4]-CM). Syncope, recurrent. Additional history provided by
scanning technologist: History of left breast cancer. Recent
syncopal episode.

EXAM:
MRI HEAD WITHOUT AND WITH CONTRAST
TECHNIQUE: Multiplanar, multiecho pulse sequences of the brain and surrounding
structures were obtained without and with intravenous contrast.
CONTRAST:  9mL GADAVIST GADOBUTROL 1 MMOL/ML IV SOLN

[Series 5: DWI · axial · 3.0mm · 1.36mm/px · z∈[-57,+95]mm · 6 of 104 slices shown (1 of 2)]
[im 1/104]
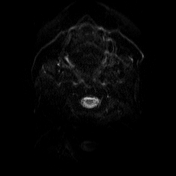
[im 21/104]
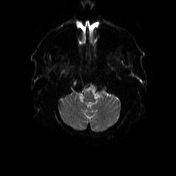
[im 42/104]
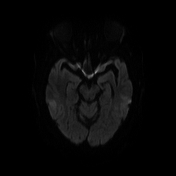
[im 62/104]
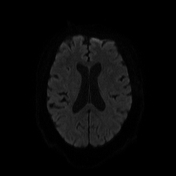
[im 83/104]
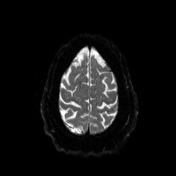
[im 104/104]
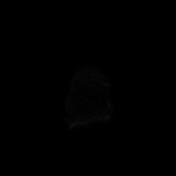

[Series 6: DWI · axial · 3.0mm · 1.36mm/px · z∈[-57,+95]mm · 3 of 52 slices shown (2 of 2)]
[im 1/52]
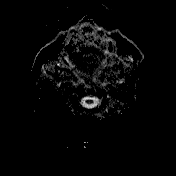
[im 26/52]
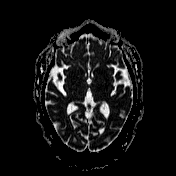
[im 52/52]
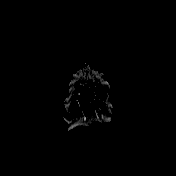

[Series 7: T1 · sagittal · 5.0mm · 0.75mm/px · 1 of 24 slices shown (1 of 2)]
[im 1/24]
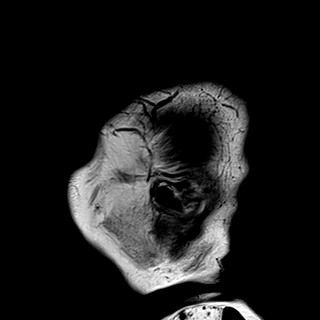

[Series 8: T2 · axial · 5.0mm · 0.62mm/px · z∈[-61,+100]mm · 2 of 26 slices shown]
[im 1/26]
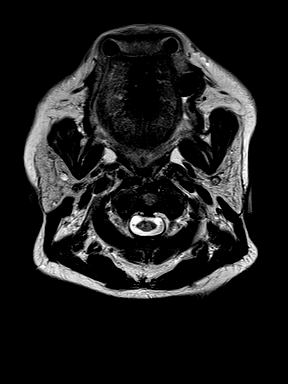
[im 26/26]
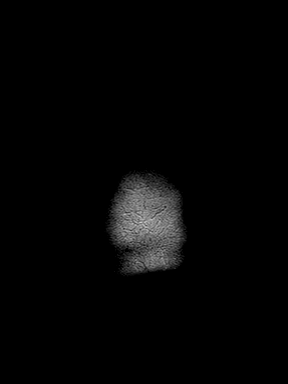

[Series 9: swi_images · axial · 3.0mm · 0.75mm/px · z∈[-62,+101]mm · 3 of 56 slices shown]
[im 1/56]
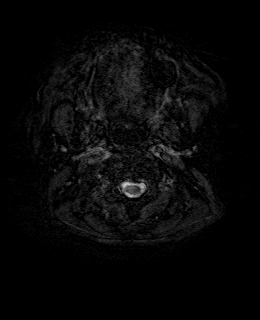
[im 28/56]
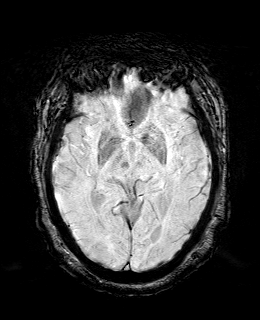
[im 56/56]
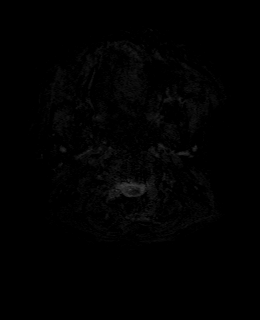

[Series 11: FLAIR · axial · 3.0mm · 0.75mm/px · z∈[-58,+96]mm · 3 of 53 slices shown]
[im 1/53]
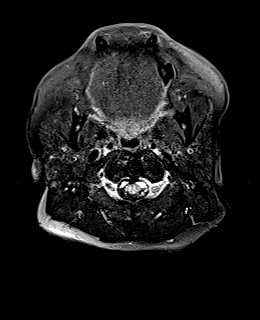
[im 27/53]
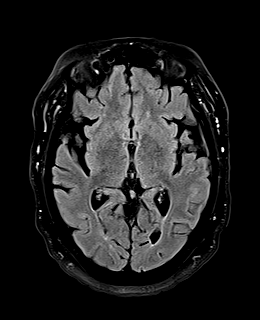
[im 53/53]
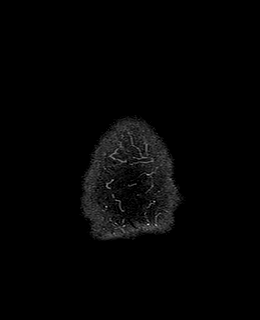

[Series 12: T1 · axial · 1.0mm · 0.94mm/px · z∈[-53,+104]mm · 10 of 160 slices shown (2 of 2)]
[im 1/160]
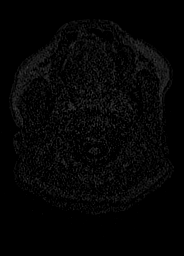
[im 18/160]
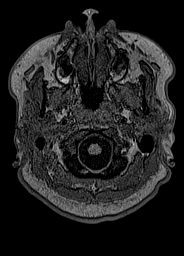
[im 36/160]
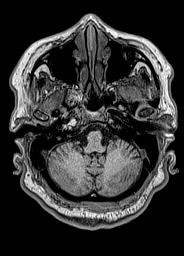
[im 54/160]
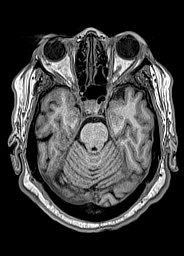
[im 71/160]
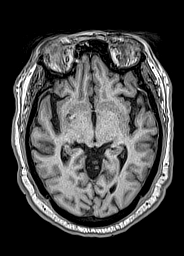
[im 89/160]
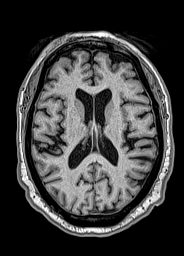
[im 107/160]
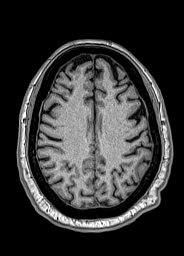
[im 124/160]
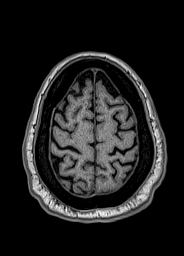
[im 142/160]
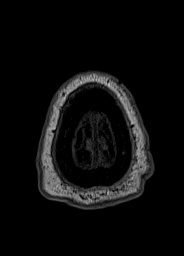
[im 160/160]
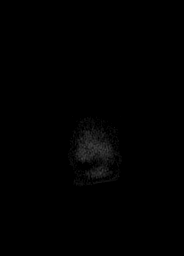

[Series 13: cor dwi_tracew · coronal · 5.0mm · 1.53mm/px · 3 of 56 slices shown]
[im 1/56]
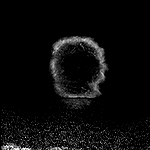
[im 28/56]
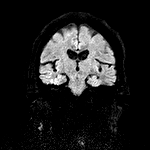
[im 56/56]
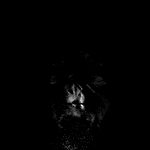

[Series 14: cor dwi_adc · coronal · 5.0mm · 1.53mm/px · 2 of 28 slices shown]
[im 1/28]
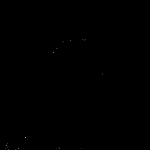
[im 28/28]
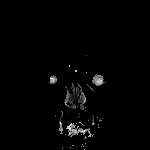

[Series 15: T2 post-contrast · coronal · 5.0mm · 0.57mm/px · 2 of 28 slices shown]
[im 1/28]
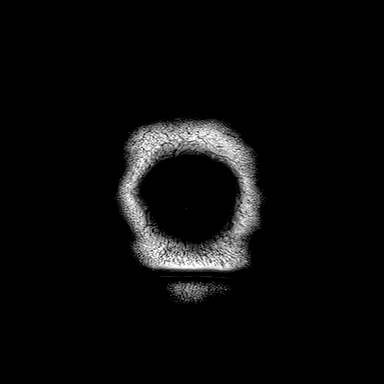
[im 28/28]
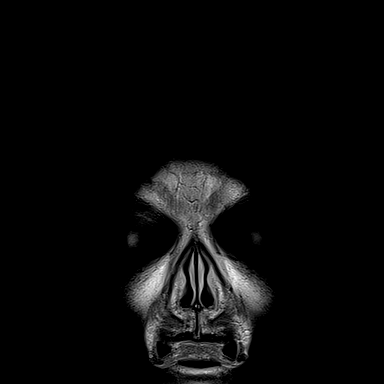

[Series 16: T1 post-contrast · axial · 1.0mm · 0.94mm/px · z∈[-53,+104]mm · 10 of 160 slices shown (1 of 3)]
[im 1/160]
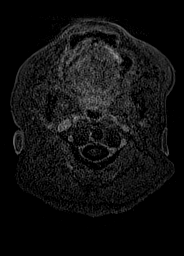
[im 18/160]
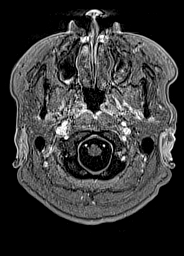
[im 36/160]
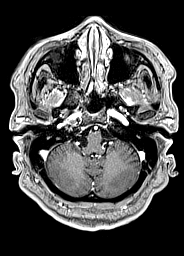
[im 54/160]
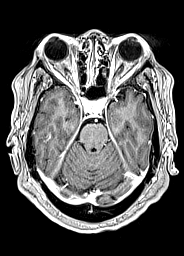
[im 71/160]
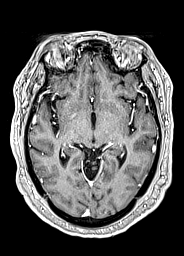
[im 89/160]
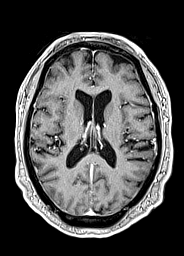
[im 107/160]
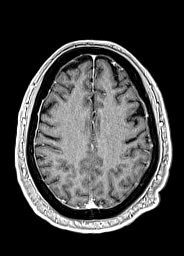
[im 124/160]
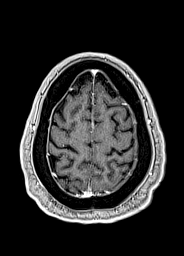
[im 142/160]
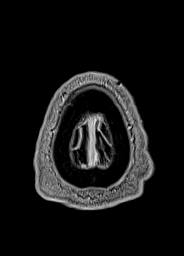
[im 160/160]
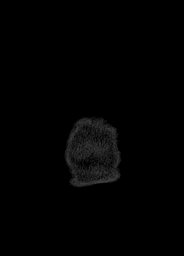

[Series 17: T1 post-contrast · coronal · 5.0mm · 0.43mm/px · 2 of 28 slices shown (2 of 3)]
[im 1/28]
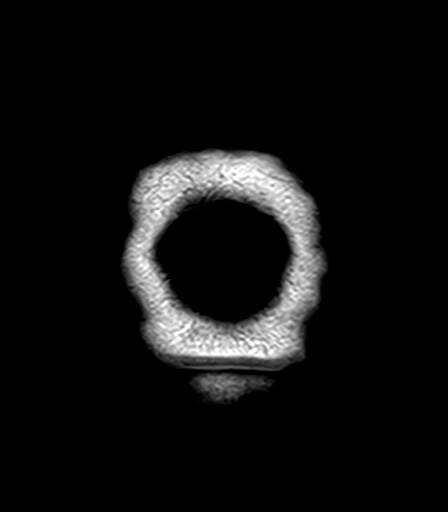
[im 28/28]
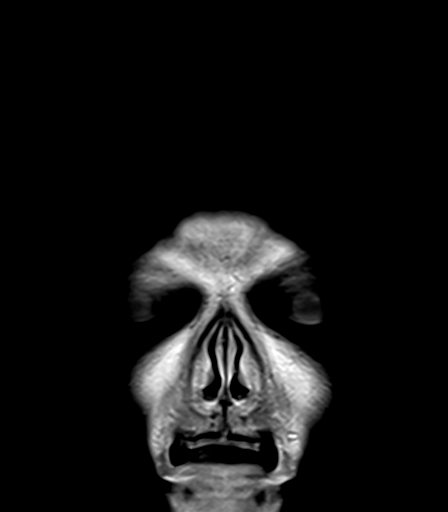

[Series 18: T1 post-contrast · sagittal · 5.0mm · 0.75mm/px · 1 of 24 slices shown (3 of 3)]
[im 1/24]
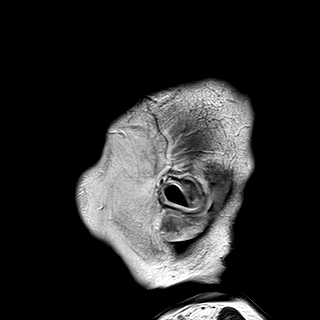

[48 of 48 positions shown; findings below may reference images not displayed]

FINDINGS: Brain:

Cerebral volume is normal for age.

Mild multifocal T2/hyperintensity within the cerebral white matter,
nonspecific but compatible with chronic small vessel ischemic
disease.

Tiny chronic infarct within the left cerebellar hemisphere (series
8, image 9).

There is no acute infarct.

No evidence of an intracranial mass.

No chronic intracranial blood products.

No extra-axial fluid collection.

No midline shift.

No pathologic intracranial enhancement.

Vascular: Maintained flow voids within the proximal large arterial
vessels.

Skull and upper cervical spine: No focal suspicious marrow lesion.
Incompletely assessed cervical spondylosis. A C4-C5 disc bulge
contributes to at least mild spinal canal stenosis.

Sinuses/Orbits: Visualized orbits show no acute finding. Bilateral
lens replacements.

Other: Right mastoid effusion/trapped fluid within the right petrous
apex. Mucous retention cyst within the right posterior nasopharynx.
IMPRESSION: No evidence of acute intracranial abnormality.

No evidence of intracranial metastatic disease.

Mild chronic small-vessel ischemic changes within the cerebral white
matter.

Tiny chronic infarct within the left cerebellar hemisphere.

Right mastoid effusion/trapped fluid within the right petrous apex.

Incompletely assessed cervical spondylosis. A C4-C5 disc bulge
contributes to at least mild spinal canal stenosis.

## 2021-05-28 MED ORDER — GADOBUTROL 1 MMOL/ML IV SOLN
9.0000 mL | Freq: Once | INTRAVENOUS | Status: AC | PRN
  Administered 2021-05-28: 9 mL via INTRAVENOUS

## 2021-05-31 ENCOUNTER — Encounter: Payer: Self-pay | Admitting: *Deleted

## 2021-06-01 ENCOUNTER — Encounter: Payer: Self-pay | Admitting: Hematology

## 2021-06-01 ENCOUNTER — Encounter: Payer: Self-pay | Admitting: *Deleted

## 2021-06-01 ENCOUNTER — Inpatient Hospital Stay (HOSPITAL_BASED_OUTPATIENT_CLINIC_OR_DEPARTMENT_OTHER): Payer: Medicare HMO | Admitting: Hematology

## 2021-06-01 ENCOUNTER — Other Ambulatory Visit: Payer: Medicare HMO

## 2021-06-01 NOTE — Progress Notes (Addendum)
[place]   [phone] Fax:(336) [phone]   Clinic Follow up Note   Patient Care Team: Cipriano Mile, NP as PCP - General [name], RN as Oncology Nurse Navigator [name], RN as Oncology Nurse Navigator [name], MD as Consulting Physician (General Surgery) [name], MD as Consulting Physician (Hematology) [name], MD as Consulting Physician (Radiation Oncology) [name], [name], MD as Consulting Physician (Cardiology)  Date of Service:  06/01/2021  I connected with Cheryl Blankenship on 06/01/2021 at  1:00 PM EDT by telephone visit and verified that I am speaking with the correct person using two identifiers.  I discussed the limitations, risks, security and privacy concerns of performing an evaluation and management service by telephone and the availability of in person appointments. I also discussed with the patient that there may be a patient responsible charge related to this service. The patient expressed understanding and agreed to proceed.   Other persons participating in the visit and their role in the encounter:  patient's daughter  Patient's location:  [place] (for her husband) Provider's location:  my office  CHIEF COMPLAINT: f/u of left breast cancer  CURRENT THERAPY:  -Herceptin, every 3 weeks -Perjeta added back on 05/06/21, stopped after one dose  -Exemestane daily  ASSESSMENT & PLAN:  Cheryl Blankenship is a 70 y.o. female with   1. Symptom management: body pain, fatigue, weakness, chills, syncope and falls  -these symptoms initially began prior to starting Perjeta but have continued and progressively worsened since being on Perjeta. -she passed out at church the weekend before beginning [place]. -she now has vomiting and some upper stomach pain. -brain MRI on 05/28/21 was negative for acute abnormality or metastatic disease. -I offered referral to neurology, she and her daughter agree. I will refer her to  Dr. Mickeal Skinner.  -I also recommended she f/u with her cardiologist to rule out cardiac causes  -I will hold on exemestane for 3 weeks to see if he symptoms improve    2. Malignant neoplasm of upper-outer quadrant of left breast, Stage II, c(T2N1M0), ER+/PR+/HER2+, Grade II, ypT1aN0 -She was diagnosed in 06/2020. She has 12cm calcification and 4.5cm mass in her left breast with 2 abnormal LN. 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  -Workup showed no evidence of distant disease -She started neoadjuvant chemo THP q3weeks on 08/05/20. Due to poor toleration after C1, she was switched to [employer] on 09/04/20. C2 postponed due to COVID (+) in early 09/2020. Due to poor toleration of C2 Kadcyla, chemo discontinued after cycle 2.  -She was restarted on Kanjinti on 12/10/20, to be given to complete a year -Exemestane started 12/10/20, she is tolerating well  -She underwent left mastectomy on 12/30/20 under Dr. [name]. Pathology showed: focal residual invasive ductal carcinoma s/p neoadjuvant treatment; residual DCIS; margins of resection not involved; all 5 lymph nodes negative (0/5).  -Perjeta was added to kanjinti on 05/06/21. She tolerated poorly, though with some symptoms possibly present prior to treatment. -given the possible side effects from Perjeta, we will discontinue this. She was tolerating Kanjinti well, so we can discuss going back to that when she feels better. I will also stop her exemestane to see if that also helps. We will plan to restart the Kanjinti in 3 weeks.   3. Postoperative seroma or hematoma -present in left axilla, seen on chest CT 05/22/21. Palpable area of fluid on exam today. -I advised that if it  is bothersome, she can go back to Dr. [name] to have it aspirated/drained.   4. Bone Health -She has not had DEXA done before. [facility]. -I discussed that AI can decrease her bone density. We will monitor  on exemestane.   5. Comorbidities: CHF, poorly controlled DM with neuropathy, HTN, Arthritis in hips -Due to Nausea and stomach pain from [place], she takes Zofran as needed.  -she is on Cymbalta and Neurontin for diabetic neuropathy -Continue medications and f/u with PCP and Cardiologist Dr. Marisue Ivan.  -She had 2 falls in 2021 due to multiple medications and poor sleep. She ambulates with cane.  -Pt is on Lasix which she notes leads to urinary incontinence. She wears depends for this. I discussed Lasix with her and advised her to talk to her cardiologist about if she needs to continue this. -Her baseline ECHO from 07/31/20 was normal, repeat 03/05/21 was stable.   6. Genetic testing, she agreed to proceed but has not proceeded with testing yet.    7. Social Support  -She does have decreased memory, she is on medication. Her family help her with history.  -She lives in [place] alone in an apartment with other older women. She is currently able to take care of herself independently. Her daughter is in town and her son is in [place]. She has other non-biological children. She lives with her husband     60. COVID (+) in early 09/2020. She was asymptomatic and did not require treatment. She tested negative in later 09/2020. She has recovered well    9.  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 Mickeal Skinner -will send a message to her cardiologist Dr. Davina Poke, for follow-up in the near future -Refer to endocrinology for hypercalcemia -labs, flush, f/u, and restart Kanjinti in 3 weeks   No problem-specific Assessment & Plan notes found for this encounter.    SUMMARY OF ONCOLOGIC HISTORY: Oncology History Overview Note  Cancer Staging Malignant neoplasm of upper-outer quadrant of left breast in female,  estrogen receptor positive ([place]) Staging form: Breast, AJCC 8th Edition - Clinical stage from 07/16/2020: Stage IB (cT2, cN1, cM0, G2, ER+, PR+, HER2+) - Signed by [name], MD on 07/21/2020    Malignant neoplasm of upper-outer quadrant of left breast in female, estrogen receptor positive ([place])  07/01/2020 Mammogram   IMPRESSION: 1. There is a 12 cm span of highly suspicious calcifications involving the upper outer and upper inner quadrants of the left breast. On ultrasound, within this area of calcifications, there are confluent masses at 1 o'clock spanning at least 4.5 cm. An additional suspicious mass is seen in the left breast at 2 o'clock.  -Ultrasound targeted to the left breast at 1 o'clock, 5 cm from the nipple demonstrates a broad area of hypoechoic irregular tissue/confluent masses spanning at least 4.5 cm. There is a separate oval hypoechoic mass with indistinct margins at 2 o'clock, 7 cm from the nipple measuring 0.9 x 0.4 x 0.9 cm. Ultrasound of the left axilla demonstrates 2 abnormally thickened lymph nodes with cortices measuring 6-7 mm. There is an additional lymph node with a borderline cortex of 4 mm.   2. There are 2 lymph nodes with thickened cortices in the left axilla, and an additional lymph node with a borderline thickened cortex.  07/16/2020 Cancer Staging   Staging form: Breast, AJCC 8th Edition - Clinical stage from 07/16/2020: Stage IIA (cT3, cN1, cM0, G2, ER+, PR+, HER2+) - Signed by [name], MD on 08/05/2020   07/16/2020 Initial Biopsy   Diagnosis 1. 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 Breast, left, needle core biopsy, 1 o'clock - INVASIVE DUCTAL CARCINOMA - SEE COMMENT 3. Lymph node, biopsy - METASTATIC CARCINOMA INVOLVING A LYMPH NODE - SEE COMMENT Microscopic Comment 1. Based on the biopsy, the ductal carcinoma in situ has a  comedo pattern, high nuclear grade and measures 0.7 cm in greatest linear extent. Prognostic markers (ER/PR) are pending and will be reported in an addendum. 2. Based on the biopsy, the carcinoma appears Nottingham grade 2 of 3 and measures 1.5 cm in greatest linear extent. Prognostic markers (ER/PR/ki-67/HER2) are pending and will be reported in an addendum. Dr. Saralyn Pilar reviewed the case and agrees with the above diagnosis. These results were called to [facility] on July 17, 2020. 3. Based on the biopsy, the carcinoma appears measures 1.0 cm in greatest linear extent. Prognostic markers (ER/PR/ki-67/HER2) are pending and will be reported in an addendum.   07/16/2020 Receptors her2   1. PROGNOSTIC INDICATORS Results: IMMUNOHISTOCHEMICAL AND MORPHOMETRIC ANALYSIS PERFORMED MANUALLY Estrogen Receptor: 30%, POSITIVE, STRONG STAINING INTENSITY Progesterone Receptor: 5%, POSITIVE, STRONG STAINING INTENSITY  2. PROGNOSTIC INDICATORS Results: IMMUNOHISTOCHEMICAL AND MORPHOMETRIC ANALYSIS PERFORMED MANUALLY The tumor cells are POSITIVE for Her2 (3+). Estrogen Receptor: 85%, POSITIVE, STRONG STAINING INTENSITY Progesterone Receptor: 60%, POSITIVE, STRONG STAINING INTENSITY Proliferation Marker Ki67: 25%  3. PROGNOSTIC INDICATORS Results: IMMUNOHISTOCHEMICAL AND MORPHOMETRIC ANALYSIS PERFORMED MANUALLY The tumor cells are POSITIVE for Her2 (3+). Estrogen Receptor: 95%, POSITIVE, STRONG STAINING INTENSITY Progesterone Receptor: 45%, POSITIVE, STRONG STAINING INTENSITY Proliferation Marker Ki67: 30%   07/20/2020 Initial Diagnosis   Malignant neoplasm of upper-outer quadrant of left breast in female, estrogen receptor positive ([place])   07/31/2020 Echocardiogram   Baseline Echo  IMPRESSIONS     1. Left ventricular ejection fraction, by estimation, is 65 to 70%. The  left ventricle has normal function. The left ventricle has no regional  wall motion abnormalities.  Left ventricular diastolic parameters are  indeterminate. The average left  ventricular global longitudinal strain is -17.1 %.   2. Right ventricular systolic function is normal. The right ventricular  size is normal. There is normal pulmonary artery systolic pressure.   3. The mitral valve is normal in structure. Trivial mitral valve  regurgitation. No evidence of mitral stenosis.   4. The aortic valve is normal in structure. Aortic valve regurgitation is  not visualized. No aortic stenosis is present.    07/31/2020 Breast MRI   IMPRESSION: 1. Extensive non mass enhancement within the left breast extending from the nipple to the chest wall. Overall findings are most compatible with extensive malignancy involving the majority of the left breast. 2. Indeterminate enhancing mass within the upper inner right breast. 3. Three morphologically abnormal lymph nodes within the left axilla. One of these nodes has been biopsied compatible with metastatic adenopathy.     08/03/2020 Imaging   Bone Scan whole body  IMPRESSION: Increased radiotracer uptake in the mid to lower lumbar regions of uncertain etiology. Correlation with radiography to assess for potential arthropathy in these areas advised. Increased uptake in major joints is likely of arthropathic etiology. Increased uptake in the mid face is likely due  to paranasal sinus disease. Distribution of radiotracer uptake in bony structures elsewhere unremarkable.   08/04/2020 Procedure   PAC placement by Dr [name]   08/05/2020 - 10/28/2020 Neo-Adjuvant Chemotherapy   Neoadjuvant THP q3weeks for 4-6 cycles starting 08/05/20, followed by Herceptin or Kadcyla q3weeks to complete 1 year of treatment. Stopped THP after 1 dose due to poor toleration       -I switched her to [place] starting 08/25/20. Stopped after C2 due to poor toleration (10/28/20)   08/07/2020 Imaging   CT CAP  IMPRESSION: 2.7 cm mass in the upper outer left breast,  likely corresponding to the patient's known primary breast neoplasm.   8 mm short axis left axillary node, suspicious for nodal metastasis in this clinical context.   No findings suspicious for distant metastasis.   Endometrium is mildly prominent, measuring 11 mm. Correlate for vaginal bleeding and consider pelvic ultrasound for further evaluation if clinically warranted.   08/11/2020 Pathology Results   Diagnosis Breast, right, needle core biopsy, right breast - PAPILLARY LESION - SEE COMMENT Microscopic Comment The biopsy consists of a papillary lesion with a focal area of adenosis. Immunohistochemistry (SMM, calponin and p63) shows retention of the myoepithelial cell layer in the focus of adenosis. 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 These results were called to [facility] on August 12, 2020.   10/27/2020 Imaging   CT Angio chest  IMPRESSION: Slightly suboptimal opacification of the main pulmonary artery, however no central or proximal segmental pulmonary embolism   Minimal bibasilar atelectasis   Aortic Atherosclerosis (ICD10-I70.0).   10/31/2020 Imaging   CT AP  IMPRESSION: 1. No acute finding by CT other than a large amount of stool and gas in the right colon and gas in the transverse colon. No small bowel obstruction. 2. Previous cholecystectomy. 3. Aortic atherosclerosis. 4. Umbilical hernia containing only fat. 5. Small amount of fluid in the endometrial canal as seen previously.   Aortic Atherosclerosis (ICD10-I70.0).   12/04/2020 Imaging   MRI Breast IMPRESSION: 1. Evidence of a positive response to interval treatment. There has been a significant improvement of both the extent and strength of the enhancement throughout the outer LEFT breast when compared to the earlier MRI of 07/31/2020. 2. However, there remains scattered areas of mass and non-mass enhancement throughout the outer LEFT breast, involving  the upper outer quadrant and lower outer quadrant, as detailed above. 3. The 3 morphologically abnormal lymph nodes in the LEFT axilla identified on previous MRI, 1 a biopsy-proven metastasis, are all slightly smaller indicating an additional positive response to interval treatment. 4. No evidence of malignancy within the RIGHT breast.   12/10/2020 -  Chemotherapy    Patient is on Treatment Plan: BREAST TRASTUZUMAB + PERJETA (05/06/21) Q21D       12/30/2020 Surgery   A. BREAST, LEFT, MASTECTOMY:  - Focal residual invasive ductal carcinoma status post neoadjuvant  treatment.  - Residual ductal carcinoma in situ.  - Margins of resection are not involved.  - Biopsy clip (X2).   B. LYMPH NODES, LEFT #1, DISSECTION:  - Three lymph nodes, negative for carcinoma (0/3).  - Biopsy clip (X1).   C. LYMPH NODES, LEFT #2, DISSECTION:  - Two lymph nodes, negative for carcinoma (0/2).   PROGNOSTIC INDICATOR RESULTS:  - The tumor cells are POSITIVE for Her2 (3+).  - Estrogen Receptor:       POSITIVE, 90%, STRONG STAINING  - Progesterone Receptor:   POSITIVE, 50%,  MODERATE STAINING    12/30/2020 Cancer Staging   Staging form: Breast, AJCC 8th Edition - Pathologic stage from 12/30/2020: No Stage Recommended (ypT1a, pN0, cM0, G2, ER+, PR+, HER2+) - Signed by [name], MD on 01/20/2021 Stage prefix: Post-therapy Histologic grading system: 3 grade system Residual tumor (R): R0 - None   02/17/2021 - 04/08/2021 Radiation Therapy    Site Technique Total Dose (Gy) Dose per Fx (Gy) Completed Fx Beam Energies  Chest Wall, Left: CW_Lt 3D 50.4/50.4 1.8 28/28 10X  Chest Wall, Left: CW_Lt_SCLV 3D 50.4/50.4 1.8 28/28 6X, 15X  Chest Wall, Left: CW_Lt_Bst Electron 10/10 2 5/5 9E       INTERVAL HISTORY:  Tineshia Becraft was contacted for a follow up of breast cancer. She was last seen by me on 05/27/21. She reports she is generally doing well. She reports balance issues and increased salivating. Her  daughter reports Cheryl Blankenship mask will be wet from saliva. She reports some trouble swallowing bigger things. She notes she is still walking with her walker and has to stop every so often.  She also reports some right-sided abdominal pain "underneath my stomach." She denies diarrhea but notes she is not eating much, just small amounts.    All other systems were reviewed with the patient and are negative.  MEDICAL HISTORY:  Past Medical History:  Diagnosis Date   Anxiety    Breast cancer ([place])    CHF (congestive heart failure) ([place])    Depression    Diabetes mellitus without complication ([place])    type 2   GERD (gastroesophageal reflux disease)    History of kidney stones    Hypertension    PONV (postoperative nausea and vomiting)    Stroke ([place])     SURGICAL HISTORY: Past Surgical History:  Procedure Laterality Date   APPENDECTOMY     CHOLECYSTECTOMY     COLONOSCOPY     ESOPHAGOGASTRODUODENOSCOPY (EGD) WITH PROPOFOL N/A 11/01/2020   Procedure: ESOPHAGOGASTRODUODENOSCOPY (EGD) WITH PROPOFOL;  Surgeon: Arta Silence, MD;  Location: [facility];  Service: Endoscopy;  Laterality: N/A;   EYE SURGERY     cataracts   MASTECTOMY WITH AXILLARY LYMPH NODE DISSECTION Left 12/30/2020   Procedure: LEFT MASTECTOMY WITH TARGETED AXILLARY LYMPH NODE DISSECTION;  Surgeon: [name], MD;  Location: [place];  Service: General;  Laterality: Left;   MULTIPLE TOOTH EXTRACTIONS     PORTACATH PLACEMENT N/A 08/04/2020   Procedure: INSERTION PORT-A-CATH WITH ULTRASOUND GUIDANCE;  Surgeon: [name], MD;  Location: [place] [place];  Service: General;  Laterality: N/A;   STOMACH SURGERY      I have reviewed the social history and family history with the patient and they are unchanged from previous note.  ALLERGIES:  is allergic to coconut (cocos nucifera) allergy skin test, hydrochlorothiazide, lisinopril, and coconut oil.  MEDICATIONS:  Current Outpatient Medications  Medication Sig Dispense Refill    Accu-Chek FastClix Lancets MISC 1 each by Other route in the morning, at noon, and at bedtime.     albuterol (VENTOLIN HFA) 108 (90 Base) MCG/ACT inhaler Inhale 1-2 puffs into the lungs every 4 (four) hours as needed for wheezing or shortness of breath. 8 g 0   ALPRAZolam (XANAX) 0.25 MG tablet Take 1-2 tablets (0.25-0.5 mg total) by mouth 3 (three) times daily as needed for anxiety or sleep. (Patient not taking: Reported on 05/22/2021) 30 tablet 0   amLODipine (NORVASC) 10 MG tablet Take 1 tablet (10 mg total) by mouth daily. 30 tablet 10  aspirin 81 MG chewable tablet Chew 81 mg by mouth daily.      bisacodyl (DULCOLAX) 5 MG EC tablet Take 1 tablet (5 mg total) by mouth daily as needed for moderate constipation. (Patient not taking: No sig reported) 30 tablet 0   carvedilol (COREG) 25 MG tablet Take 1 tablet (25 mg total) by mouth 2 (two) times daily with a meal. 180 tablet 3   Cholecalciferol 25 MCG (1000 UT) capsule Take 1,000 Units by mouth daily.     diphenoxylate-atropine (LOMOTIL) 2.5-0.025 MG tablet TAKE 1 OR 2 TABLETS BY MOUTH 4 TIMES DAILY AS NEEDED FOR DIARRHEA OR LOOSE STOOLS (Patient taking differently: Take 1-2 tablets by mouth 4 (four) times daily as needed for diarrhea or loose stools.) 60 tablet 2   DULoxetine (CYMBALTA) 60 MG capsule Take 60 mg by mouth daily.     exemestane (AROMASIN) 25 MG tablet Take 1 tablet (25 mg total) by mouth daily after breakfast. 90 tablet 2   folic acid (FOLVITE) 1 MG tablet Take 1 mg by mouth daily.     gabapentin (NEURONTIN) 400 MG capsule Take 400 mg by mouth 3 (three) times daily.      glipiZIDE (GLUCOTROL) 10 MG tablet Take 10 mg by mouth daily before breakfast.     insulin aspart (NOVOLOG) 100 UNIT/ML injection Inject 2-10 Units into the skin 3 (three) times daily before meals. Per sliding scale:  150-199 = 2 units, 200-249 = 4 units, 250-299 = 6 units, 300-349 = 8 units, greater than350 = 10 units.     insulin glargine (LANTUS SOLOSTAR) 100 UNIT/ML  Solostar Pen Inject 35 Units into the skin 2 (two) times daily. (Patient taking differently: Inject 70 Units into the skin 2 (two) times daily. Pt takes 70 units in the morning and 70 units at night.) 15 mL 11   lidocaine-prilocaine (EMLA) cream Apply 1 application topically as needed (pain). (Patient taking differently: Apply 1 application topically daily.) 30 g 3   losartan (COZAAR) 100 MG tablet TAKE 1 TABLET BY MOUTH EVERY DAY (Patient taking differently: Take 100 mg by mouth daily.) 90 tablet 1   metFORMIN (GLUCOPHAGE) 500 MG tablet Take 500 mg by mouth 2 (two) times daily with a meal.      methocarbamol (ROBAXIN) 500 MG tablet Take 500 mg by mouth every 8 (eight) hours as needed for muscle spasms.     Omega-3 Fatty Acids (FISH OIL) 1000 MG CAPS Take 1,000 mg by mouth daily.     omeprazole (PRILOSEC) 20 MG capsule Take 1 capsule (20 mg total) by mouth daily. 30 capsule 11   ondansetron (ZOFRAN-ODT) 4 MG disintegrating tablet Take 1 tablet (4 mg total) by mouth 2 (two) times daily as needed for up to 30 doses for nausea or vomiting. 30 tablet 0   potassium chloride (KLOR-CON) 10 MEQ tablet Take 20 mEq by mouth daily.     prochlorperazine (COMPAZINE) 10 MG tablet Take 1 tablet (10 mg total) by mouth every 6 (six) hours as needed for nausea or vomiting. 30 tablet 5   promethazine (PHENERGAN) 25 MG suppository Place 1 suppository (25 mg total) rectally every 8 (eight) hours as needed for nausea or vomiting. (Patient not taking: No sig reported) 30 each 1   RESTASIS 0.05 % ophthalmic emulsion Place 1 drop into both eyes 2 (two) times daily as needed (dry eyes).     spironolactone (ALDACTONE) 25 MG tablet TAKE 1 TABLET BY MOUTH EVERY DAY (Patient taking differently: Take 25  mg by mouth daily.) 90 tablet 1   sucralfate (CARAFATE) 1 GM/10ML suspension Take 10 mLs (1 g total) by mouth 4 (four) times daily -  with meals and at bedtime. (Patient not taking: No sig reported) 420 mL 0   vitamin B-12  (CYANOCOBALAMIN) 1000 MCG tablet Take 1,000 mcg by mouth daily.     zolpidem (AMBIEN) 5 MG tablet Take 1 tablet (5 mg total) by mouth at bedtime as needed for sleep. 20 tablet 0   No current facility-administered medications for this visit.    PHYSICAL EXAMINATION: ECOG PERFORMANCE STATUS: 2 - Symptomatic, <50% confined to bed  There were no vitals filed for this visit. Wt Readings from Last 3 Encounters:  05/27/21 202 lb 1.6 oz (91.7 kg)  05/22/21 196 lb (88.9 kg)  05/06/21 206 lb (93.4 kg)     No vitals taken today, Exam not performed today  LABORATORY DATA:  I have reviewed the data as listed CBC Latest Ref Rng & Units 05/27/2021 05/22/2021 05/06/2021  WBC 4.0 - 10.5 K/uL 5.7 4.3 4.5  Hemoglobin 12.0 - 15.0 g/dL 10.8(L) 11.1(L) 11.4(L)  Hematocrit 36.0 - 46.0 % 34.0(L) 35.2(L) 35.3(L)  Platelets 150 - 400 K/uL 411(H) 399 265     CMP Latest Ref Rng & Units 05/27/2021 05/27/2021 05/22/2021  Glucose 70 - 99 mg/dL 236(H) - 142(H)  BUN 8 - 23 mg/dL 30(H) - 31(H)  Creatinine 0.44 - 1.00 mg/dL 1.41(H) - 1.30(H)  Sodium 135 - 145 mmol/L 141 - 140  Potassium 3.5 - 5.1 mmol/L 3.8 - 4.2  Chloride 98 - 111 mmol/L 100 - 100  CO2 22 - 32 mmol/L 29 - 30  Calcium 8.7 - 10.3 mg/dL 11.0(H) 10.6(H) 10.5(H)  Total Protein 6.5 - 8.1 g/dL 7.5 - 7.2  Total Bilirubin 0.3 - 1.2 mg/dL 0.4 - 0.6  Alkaline Phos 38 - 126 U/L 71 - 60  AST 15 - 41 U/L 11(L) - 18  ALT 0 - 44 U/L 15 - 18      RADIOGRAPHIC STUDIES: I have personally reviewed the radiological images as listed and agreed with the findings in the report. No results found.    Orders Placed This Encounter  Procedures   Ambulatory referral to Endocrinology    Referral Priority:   Routine    Referral Type:   Consultation    Referral Reason:   Specialty Services Required    Number of Visits Requested:   1    All questions were answered. The patient knows to call the clinic with any problems, questions or concerns. No barriers to learning  was detected. The total time spent in the appointment was 25 minutes.     [name], MD 06/01/2021   I, Wilburn Mylar, am acting as scribe for [name], MD.   I have reviewed the above documentation for accuracy and completeness, and I agree with the above.

## 2021-06-02 ENCOUNTER — Telehealth: Payer: Self-pay | Admitting: Hematology

## 2021-06-02 LAB — GLUCOSE, CAPILLARY: Glucose-Capillary: 186 mg/dL — ABNORMAL HIGH (ref 70–99)

## 2021-06-02 NOTE — Telephone Encounter (Signed)
Scheduled follow-up appointment per 9/13 los. Patient is aware. Mailed calendar.

## 2021-06-03 ENCOUNTER — Encounter: Payer: Self-pay | Admitting: *Deleted

## 2021-06-03 ENCOUNTER — Telehealth: Payer: Self-pay

## 2021-06-03 NOTE — Telephone Encounter (Signed)
Called patient per staff message from Dr.[name] to get patient in for follow up-  LVM to call back to discuss an appointment.  Left call back number.

## 2021-06-07 ENCOUNTER — Telehealth: Payer: Self-pay | Admitting: Internal Medicine

## 2021-06-07 ENCOUNTER — Ambulatory Visit: Payer: Medicare HMO | Admitting: Internal Medicine

## 2021-06-07 NOTE — Telephone Encounter (Signed)
Pt had called to r/s her appt due to transportation issues. I spoke to pt and r/s her to next Tuesday per her request. Pt is aware of new appt date and time.

## 2021-06-08 ENCOUNTER — Telehealth: Payer: Self-pay

## 2021-06-08 ENCOUNTER — Other Ambulatory Visit: Payer: Self-pay | Admitting: Nurse Practitioner

## 2021-06-08 ENCOUNTER — Other Ambulatory Visit: Payer: Self-pay | Admitting: Hematology

## 2021-06-08 LAB — PTH-RELATED PEPTIDE: PTH-related peptide: <2 pmol/L

## 2021-06-08 NOTE — Telephone Encounter (Signed)
Called patient, spoke with her- she is scheduled to be seen on Monday, September 26th at 2:30 PM with Dr.[name]. Thanks!

## 2021-06-08 NOTE — Telephone Encounter (Signed)
-----   Message from Geralynn Rile, MD sent at 06/03/2021  4:04 PM EDT ----- Thanks for calling Horace. We have had issues with her in the past.   [place] Bells T. Audie Box, MD, [place]  [address] [place], [place] [address] [phone]  4:04 PM  ----- Message ----- From: Caprice Beaver, LPN Sent: 8/97/8478   4:02 PM EDT To: Geralynn Rile, MD  Just updating you, I called patient. She didn't answer. I left a voicemail to call me back to get scheduled.  ----- Message ----- From: Geralynn Rile, MD Sent: 06/02/2021   5:44 AM EDT To: [name], MD, Caprice Beaver, LPN  Not a problem. We will get her in.   Almyra Free -> Get her back in for a follow-up.   [place] Bells T. Audie Box, MD, [place]  [address], [place],  [address] [phone]  5:44 AM  ----- Message ----- From: [name], MD Sent: 06/01/2021   9:57 PM EDT To: Geralynn Rile, MD, Alla Feeling, NP, #  Dr. Audie Box,  She has developed worsening fatigue, had a few episodes of syncope and fall.  Could you see her back to ruled out cardiac etiology? She is also due for echo, thx  Zach, could you see her for her leg weakness and balance issue? She has diabetic peripheral neuropathy, which got worse after a few dose of chemo.  Joy, I also referred her to [facility] endocrinology, please also make sure they will see her for hypercalcemia.    Thanks  Krista Blue

## 2021-06-09 ENCOUNTER — Encounter: Payer: Self-pay | Admitting: Hematology

## 2021-06-11 ENCOUNTER — Encounter: Payer: Self-pay | Admitting: Hematology

## 2021-06-13 NOTE — Progress Notes (Signed)
Cardiology Office Note:   Date:  06/14/2021  NAME:  Cheryl Blankenship    MRN: [id number] DOB:  [birth year]   PCP:  Cipriano Mile, NP  Cardiologist:  None  Electrophysiologist:  None   Referring MD: Cipriano Mile, NP   Chief Complaint  Patient presents with   Follow-up   History of Present Illness:   Cheryl Blankenship is a 70 y.o. female with a hx of breast CA, DM, HTN, HLD, HFpEF who presents for follow-up. Seen in ER 05/22/2021 for malaise and fatigue. Normal BNP. Troponin normal. Has not tolerated chemo. Good response to surgery per notes.   She reports she is had several syncopal events.  She describes a warm sensation that comes over her body.  She reports she gets sweaty.  This occurs with position change.  She reports she blacks out and comes to quickly.  No chest pain or significant trouble breathing with the episodes.  She reports she does not urinate or defecate on herself.  Her EKG in office is normal.  Recent emergency room visit showed negative PE scan.  She had minimal coronary calcifications.  She reports she is not eating or drinking well at all.  She is apparently having a lot of trouble with her chemotherapy.  I have encouraged her to drink 4 to 6 glasses of water daily.  She reports intermittent soreness in her chest.  She informs me it is not exertional.  Not positional.  She states it is worse with movement of her left arm.  She was noted to have a subcutaneous seroma related to her recent left mastectomy.  Her EKG today in office is nonischemic.  Recent ER visit shows negative troponins.  I did review her CT PE study which was also negative and this shows minimal coronary calcifications.  Her blood pressure is actually well controlled today 128/76.  She is euvolemic on exam.  She is working with her endocrinologist.  Apparently they took her off her Lasix as her volume status has been acceptable.  Most recent ER visit shows normal BNP.  She is due for repeat echocardiogram.   She appears to be stable.  She reports she is active.  She is not tolerating chemotherapy well at all.  Recent brain MRI also shows chronic microvascular ischemia.  I do wonder if she has some early elements of vascular dementia.  Problem List 1. DM -A1c 9.8 -Normal stress test, normal echo 08/2019 [facility] [place]) 2. HTN 3. HLD -T chol 154, TG 139, HDL 60, LDL 66 4. Breast CA Stage II, ER+, PR+, HER2+ -currently on neo-adjuvant antiestrogen oral therapy (on trastuzumab) -Mastectomy 12/30/2020 -chest seroma from prior surgery 5. HFpEF -EF 65-70%  Past Medical History: Past Medical History:  Diagnosis Date   Anxiety    Breast cancer ([place])    CHF (congestive heart failure) ([place])    Depression    Diabetes mellitus without complication ([place])    type 2   GERD (gastroesophageal reflux disease)    History of kidney stones    Hypertension    PONV (postoperative nausea and vomiting)    Stroke [facility])     Past Surgical History: Past Surgical History:  Procedure Laterality Date   APPENDECTOMY     CHOLECYSTECTOMY     COLONOSCOPY     ESOPHAGOGASTRODUODENOSCOPY (EGD) WITH PROPOFOL N/A 11/01/2020   Procedure: ESOPHAGOGASTRODUODENOSCOPY (EGD) WITH PROPOFOL;  Surgeon: Arta Silence, MD;  Location: [facility];  Service: Endoscopy;  Laterality: N/A;   EYE SURGERY  cataracts   MASTECTOMY WITH AXILLARY LYMPH NODE DISSECTION Left 12/30/2020   Procedure: LEFT MASTECTOMY WITH TARGETED AXILLARY LYMPH NODE DISSECTION;  Surgeon: [name], MD;  Location: [place];  Service: General;  Laterality: Left;   MULTIPLE TOOTH EXTRACTIONS     PORTACATH PLACEMENT N/A 08/04/2020   Procedure: INSERTION PORT-A-CATH WITH ULTRASOUND GUIDANCE;  Surgeon: [name], MD;  Location: [place];  Service: General;  Laterality: N/A;   STOMACH SURGERY      Current Medications: Current Meds  Medication Sig   Accu-Chek FastClix Lancets MISC 1 each by Other route in the morning, at noon, and at bedtime.   albuterol  (VENTOLIN HFA) 108 (90 Base) MCG/ACT inhaler Inhale 1-2 puffs into the lungs every 4 (four) hours as needed for wheezing or shortness of breath.   ALPRAZolam (XANAX) 0.25 MG tablet Take 1-2 tablets (0.25-0.5 mg total) by mouth 3 (three) times daily as needed for anxiety or sleep.   aspirin 81 MG chewable tablet Chew 81 mg by mouth daily.    bisacodyl (DULCOLAX) 5 MG EC tablet Take 1 tablet (5 mg total) by mouth daily as needed for moderate constipation.   Cholecalciferol 25 MCG (1000 UT) capsule Take 1,000 Units by mouth daily.   diphenoxylate-atropine (LOMOTIL) 2.5-0.025 MG tablet TAKE 1 OR 2 TABLETS BY MOUTH 4 TIMES DAILY AS NEEDED FOR DIARRHEA OR loose stools   DULoxetine (CYMBALTA) 60 MG capsule Take 60 mg by mouth daily.   exemestane (AROMASIN) 25 MG tablet Take 1 tablet (25 mg total) by mouth daily after breakfast.   folic acid (FOLVITE) 1 MG tablet Take 1 mg by mouth daily.   furosemide (LASIX) 20 MG tablet Take 1 tablet (20 mg total) by mouth daily as needed (for swelling or weight gain).   gabapentin (NEURONTIN) 400 MG capsule Take 400 mg by mouth 3 (three) times daily.    glipiZIDE (GLUCOTROL) 10 MG tablet Take 10 mg by mouth daily before breakfast.   insulin aspart (NOVOLOG) 100 UNIT/ML injection Inject 2-10 Units into the skin 3 (three) times daily before meals. Per sliding scale:  150-199 = 2 units, 200-249 = 4 units, 250-299 = 6 units, 300-349 = 8 units, greater than350 = 10 units.   insulin glargine (LANTUS SOLOSTAR) 100 UNIT/ML Solostar Pen Inject 35 Units into the skin 2 (two) times daily. (Patient taking differently: Inject 70 Units into the skin 2 (two) times daily. Pt takes 70 units in the morning and 70 units at night.)   lidocaine-prilocaine (EMLA) cream Apply 1 application topically as needed (pain). (Patient taking differently: Apply 1 application topically daily.)   metFORMIN (GLUCOPHAGE) 500 MG tablet Take 500 mg by mouth 2 (two) times daily with a meal.    methocarbamol  (ROBAXIN) 500 MG tablet Take 500 mg by mouth every 8 (eight) hours as needed for muscle spasms.   Omega-3 Fatty Acids (FISH OIL) 1000 MG CAPS Take 1,000 mg by mouth daily.   omeprazole (PRILOSEC) 20 MG capsule Take 1 capsule (20 mg total) by mouth daily.   ondansetron (ZOFRAN-ODT) 4 MG disintegrating tablet TAKE 1 TABLET BY MOUTH 2 TIMES DAILY AS NEEDED FOR NAUSEA AND VOMITING   prochlorperazine (COMPAZINE) 10 MG tablet Take 1 tablet (10 mg total) by mouth every 6 (six) hours as needed for nausea or vomiting.   PROMETHEGAN 25 MG suppository place 1 SUPPOSITORY rectally EVERY 8 HOURS AS NEEDED FOR NAUSEA AND VOMITING   RESTASIS 0.05 % ophthalmic emulsion Place 1 drop into both eyes 2 (  two) times daily as needed (dry eyes).   sucralfate (CARAFATE) 1 GM/10ML suspension Take 10 mLs (1 g total) by mouth 4 (four) times daily -  with meals and at bedtime.   vitamin B-12 (CYANOCOBALAMIN) 1000 MCG tablet Take 1,000 mcg by mouth daily.   zolpidem (AMBIEN) 5 MG tablet Take 1 tablet (5 mg total) by mouth at bedtime as needed for sleep.   [DISCONTINUED] amLODipine (NORVASC) 10 MG tablet Take 1 tablet (10 mg total) by mouth daily.   [DISCONTINUED] losartan (COZAAR) 100 MG tablet TAKE 1 TABLET BY MOUTH EVERY DAY (Patient taking differently: Take 100 mg by mouth daily.)   [DISCONTINUED] potassium chloride (KLOR-CON) 10 MEQ tablet Take 20 mEq by mouth daily.   [DISCONTINUED] spironolactone (ALDACTONE) 25 MG tablet TAKE 1 TABLET BY MOUTH EVERY DAY (Patient taking differently: Take 25 mg by mouth daily.)    Allergies:    Coconut (cocos nucifera) allergy skin test, Hydrochlorothiazide, Lisinopril, and Coconut oil   Social History: Social History   Socioeconomic History   Marital status: Married    Spouse name: Not on file   Number of children: 2   Years of education: Not on file   Highest education level: Not on file  Occupational History   Occupation: retired Human resources officer  Tobacco Use   Smoking status:  Former    Packs/day: 3.00    Years: 30.00    Pack years: 90.00    Types: Cigarettes   Smokeless tobacco: Never  Vaping Use   Vaping Use: Never used  Substance and Sexual Activity   Alcohol use: Not Currently    Comment: 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  ROS:   All other ROS reviewed and negative. Pertinent positives noted in the HPI.     EKGs/Labs/Other Studies Reviewed:   The following studies were personally reviewed by me today:  EKG:  EKG is  ordered today.  The ekg ordered today demonstrates normal sinus rhythm heart rate 70, LVH, and was personally reviewed by me.   TTE 03/05/2021  1. Left ventricular ejection fraction, by estimation, is 60 to 65%. The  left ventricle has normal function. The left ventricle has no regional  wall motion abnormalities. There is mild left ventricular hypertrophy.  Left ventricular diastolic parameters  are consistent with Grade I diastolic dysfunction (impaired relaxation).   2. Right ventricular systolic function is normal. The right ventricular  size is normal. There is normal pulmonary artery systolic pressure. The  estimated right ventricular systolic pressure is 24.4 mmHg.   3. The mitral valve is abnormal. Trivial mitral valve regurgitation.   4. The aortic valve is tricuspid. Aortic valve regurgitation is not  visualized.   5. The inferior vena cava  is normal in size with greater than 50%  respiratory variability, suggesting right atrial pressure of 3 mmHg.  Recent Labs: 10/30/2020: Magnesium 1.8 05/22/2021: B Natriuretic Peptide 14.2 05/27/2021: ALT 15; BUN 30; Creatinine 1.41; Hemoglobin 10.8; Platelet Count 411; Potassium 3.8; Sodium 141   Recent Lipid Panel    Component Value Date/Time   CHOL 134 10/30/2020 1513   TRIG 165 (H) 10/30/2020 1513   HDL 38 (L) 10/30/2020 1513   CHOLHDL 3.5 10/30/2020 1513   VLDL 33 10/30/2020 1513   LDLCALC 63 10/30/2020 1513    Physical Exam:   VS:  BP 128/76   Pulse 70   Ht 5' 3.6" (1.615 m)   Wt 210 lb 3.2 oz (95.3 kg)   SpO2 96%   BMI 36.54 kg/m    Wt Readings from Last 3 Encounters:  06/14/21 210 lb 3.2 oz (95.3 kg)  05/27/21 202 lb 1.6 oz (91.7 kg)  05/22/21 196 lb (88.9 kg)    General: Well nourished, well developed, in no acute distress Head: Atraumatic, normal size  Eyes: PEERLA, EOMI  Neck: Supple, no JVD Endocrine: No thryomegaly Cardiac: Normal S1, S2; RRR; no murmurs, rubs, or gallops Lungs: Clear to auscultation bilaterally, no wheezing, rhonchi or rales  Abd: Soft, nontender, no hepatomegaly  Ext: No edema, pulses 2+ Musculoskeletal: No deformities, BUE and BLE strength normal and equal Skin: Warm and dry, no rashes   Neuro: Alert and oriented to person, place, time, and situation, CNII-XII grossly intact, no focal deficits  Psych: Normal mood and affect   ASSESSMENT:   Cheryl Blankenship is a 70 y.o. female who presents for the following: 1. Chronic diastolic heart failure ([place])   2. Chemotherapy follow-up examination   3. Syncope and collapse   4. Chest pain of uncertain etiology   5. Primary hypertension   6. Mixed hyperlipidemia     PLAN:   1. Chronic diastolic heart failure (HCC) -Euvolemic on exam.  We will add back Lasix as needed.  She actually is well controlled on today's visit.  Most recent ER visit shows normal BNP. -I do have suspicion she is not  eating or drinking that well.  She reports poor toleration of chemotherapy. -She also has evidence of chronic microvascular changes on recent brain MRI.  I do wonder if there are early stages of dementia.  2. Chemotherapy follow-up examination -Most recent echo shows normal LV function.  She is overdue for repeat study.  We will continue this every 3 months while she is on HER2 negative agents.   3. Syncope and collapse -She reports recurrent episodes of syncope.  She describes no red flag symptoms.  Recent ER visit on 05/22/2021 shows negative PE study and minimal coronary calcifications.  EKG in office demonstrates sinus rhythm with no acute ischemic changes.  She informs me she is not eating or drinking well at all.  All of her symptoms appear to be preceded by dizziness lightheadedness as well as a warm and sweaty sensation.  She also feels cool and clammy.  She comes back quickly.  There are no red flag symptoms.  I suspect this is vasovagal episodes in the setting of dehydration.  She was noticeably dehydrated during her most recent ER visit.  I recommended to maintain adequate hydration with 4 to 6 glasses of water per day.  We will recheck an echocardiogram but I see no need for stress evaluation.  She clearly is not eating or drinking enough.  4. Chest pain of uncertain etiology -Intermittent tightness in her chest.  Not exertional.  Appears to be related to recent left  mastectomy.  Troponins were negative at recent ER visit.  EKG normal.  I really see no need for further evaluation of this.  Her CT PE study showed minimal coronary calcifications.  5. Primary hypertension -Well-controlled.  She will continue Coreg 25 mg twice daily, Aldactone 25 mg daily, losartan 100 mg daily, Norvasc 10 mg daily.  Add back Lasix 20 mg daily as needed.  6. Mixed hyperlipidemia -She is diabetic.  Continue aspirin 81 mg daily.  She will continue Crestor.  Most recent LDL 63.  Disposition: Return in about 4  months (around 10/14/2021).  Medication Adjustments/Labs and Tests Ordered: Current medicines are reviewed at length with the patient today.  Concerns regarding medicines are outlined above.  Orders Placed This Encounter  Procedures   EKG 12-Lead   ECHOCARDIOGRAM COMPLETE    Meds ordered this encounter  Medications   amLODipine (NORVASC) 10 MG tablet    Sig: Take 1 tablet (10 mg total) by mouth daily.    Dispense:  90 tablet    Refill:  3   carvedilol (COREG) 25 MG tablet    Sig: Take 1 tablet (25 mg total) by mouth 2 (two) times daily with a meal.    Dispense:  180 tablet    Refill:  3   losartan (COZAAR) 100 MG tablet    Sig: Take 1 tablet (100 mg total) by mouth daily.    Dispense:  90 tablet    Refill:  3    This prescription was filled on 03/29/2021. Any refills authorized will be placed on file.   spironolactone (ALDACTONE) 25 MG tablet    Sig: Take 1 tablet (25 mg total) by mouth daily.    Dispense:  90 tablet    Refill:  3    This prescription was filled on 03/29/2021. Any refills authorized will be placed on file.   furosemide (LASIX) 20 MG tablet    Sig: Take 1 tablet (20 mg total) by mouth daily as needed (for swelling or weight gain).    Dispense:  90 tablet    Refill:  3     Patient Instructions  Medication Instructions:  START: LASIX (FUROSEMIDE) 30m ONCE A DAY AS NEEDED FOR SWELLING/WEIGHT GAIN  STOP: POTASSIUM  *If you need a refill on your cardiac medications before your next appointment, please call your pharmacy*  Testing/Procedures: Your physician has requested that you have an echocardiogram. Echocardiography is a painless test that uses sound waves to create images of your heart. It provides your doctor with information about the size and shape of your heart and how well your heart's chambers and valves are working. You may receive an ultrasound enhancing agent through an IV if needed to better visualize your heart during the echo.This procedure takes  approximately one hour. There are no restrictions for this procedure. This will take place at the [address].   Follow-Up: [facility] and your health needs are our priority.  As part of our continuing mission to provide you with exceptional heart care, we have created designated Provider Care Teams.  These Care Teams include your primary Cardiologist (physician) and Advanced Practice Providers (APPs -  Physician Assistants and Nurse Practitioners) who all work together to provide you with the care you need, when you need it.  We recommend signing up for the patient portal called "MyChart".  Sign up information is provided on this After Visit Summary.  MyChart is used to connect with patients for  Virtual Visits (Telemedicine).  Patients are able to view lab/test results, encounter notes, upcoming appointments, etc.  Non-urgent messages can be sent to your provider as well.   To learn more about what you can do with MyChart, go to [email or web address].    Your next appointment:   4 month(s)  The format for your next appointment:   In Person  Provider:   Eleonore Chiquito, MD    Time Spent with Patient: I have spent a total of 35 minutes with patient reviewing hospital notes, telemetry, EKGs, labs and examining the patient as well as establishing an assessment and plan that was discussed with the patient.  > 50% of time was spent in direct patient care.  Signed, Addison Naegeli. Audie Box, MD, [facility] [place]  [address], [place], [place] [address] [phone]  06/14/2021 3:27 PM

## 2021-06-14 ENCOUNTER — Other Ambulatory Visit: Payer: Self-pay

## 2021-06-14 ENCOUNTER — Ambulatory Visit (INDEPENDENT_AMBULATORY_CARE_PROVIDER_SITE_OTHER): Payer: Medicare HMO | Admitting: Cardiovascular Disease

## 2021-06-14 ENCOUNTER — Encounter: Payer: Self-pay | Admitting: Cardiovascular Disease

## 2021-06-14 VITALS — BP 128/76 | HR 70 | Ht 63.6 in | Wt 210.2 lb

## 2021-06-14 DIAGNOSIS — R55 Syncope and collapse: Secondary | ICD-10-CM | POA: Diagnosis not present

## 2021-06-14 DIAGNOSIS — Z09 Encounter for follow-up examination after completed treatment for conditions other than malignant neoplasm: Secondary | ICD-10-CM | POA: Diagnosis not present

## 2021-06-14 DIAGNOSIS — R079 Chest pain, unspecified: Secondary | ICD-10-CM | POA: Diagnosis not present

## 2021-06-14 DIAGNOSIS — I10 Essential (primary) hypertension: Secondary | ICD-10-CM

## 2021-06-14 DIAGNOSIS — E782 Mixed hyperlipidemia: Secondary | ICD-10-CM

## 2021-06-14 DIAGNOSIS — I5032 Chronic diastolic (congestive) heart failure: Secondary | ICD-10-CM

## 2021-06-14 MED ORDER — SPIRONOLACTONE 25 MG PO TABS: 25.0000 mg | ORAL_TABLET | Freq: Every day | ORAL | 3 refills | Status: AC

## 2021-06-14 MED ORDER — LOSARTAN POTASSIUM 100 MG PO TABS: 100.0000 mg | ORAL_TABLET | Freq: Every day | ORAL | 3 refills | Status: AC

## 2021-06-14 MED ORDER — CARVEDILOL 25 MG PO TABS: 25.0000 mg | ORAL_TABLET | Freq: Two times a day (BID) | ORAL | 3 refills | Status: DC

## 2021-06-14 MED ORDER — AMLODIPINE BESYLATE 10 MG PO TABS: 10.0000 mg | ORAL_TABLET | Freq: Every day | ORAL | 3 refills | Status: DC

## 2021-06-14 MED ORDER — FUROSEMIDE 20 MG PO TABS: 20.0000 mg | ORAL_TABLET | Freq: Every day | ORAL | 3 refills | Status: AC | PRN

## 2021-06-14 NOTE — Patient Instructions (Addendum)
Medication Instructions:  START: LASIX (FUROSEMIDE) 20mg  ONCE A DAY AS NEEDED FOR SWELLING/WEIGHT GAIN  STOP: POTASSIUM  *If you need a refill on your cardiac medications before your next appointment, please call your pharmacy*  Testing/Procedures: Your physician has requested that you have an echocardiogram. Echocardiography is a painless test that uses sound waves to create images of your heart. It provides your doctor with information about the size and shape of your heart and how well your heart's chambers and valves are working. You may receive an ultrasound enhancing agent through an IV if needed to better visualize your heart during the echo.This procedure takes approximately one hour. There are no restrictions for this procedure. This will take place at the [address], [address].   Follow-Up: At [facility], you and your health needs are our priority.  As part of our continuing mission to provide you with exceptional heart care, we have created designated Provider Care Teams.  These Care Teams include your primary Cardiologist (physician) and Advanced Practice Providers (APPs -  Physician Assistants and Nurse Practitioners) who all work together to provide you with the care you need, when you need it.  We recommend signing up for the patient portal called "MyChart".  Sign up information is provided on this After Visit Summary.  MyChart is used to connect with patients for Virtual Visits (Telemedicine).  Patients are able to view lab/test results, encounter notes, upcoming appointments, etc.  Non-urgent messages can be sent to your provider as well.   To learn more about what you can do with MyChart, go to [email or web address].    Your next appointment:   4 month(s)  The format for your next appointment:   In Person  Provider:   Eleonore Chiquito, MD

## 2021-06-15 ENCOUNTER — Inpatient Hospital Stay (HOSPITAL_BASED_OUTPATIENT_CLINIC_OR_DEPARTMENT_OTHER): Payer: Medicare HMO | Admitting: Internal Medicine

## 2021-06-15 DIAGNOSIS — G20 Parkinson's disease: Secondary | ICD-10-CM | POA: Diagnosis not present

## 2021-06-15 DIAGNOSIS — T451X5A Adverse effect of antineoplastic and immunosuppressive drugs, initial encounter: Secondary | ICD-10-CM | POA: Diagnosis not present

## 2021-06-15 DIAGNOSIS — G620 Drug-induced polyneuropathy: Secondary | ICD-10-CM

## 2021-06-15 DIAGNOSIS — G20C Parkinsonism, unspecified: Secondary | ICD-10-CM | POA: Insufficient documentation

## 2021-06-15 DIAGNOSIS — C50412 Malignant neoplasm of upper-outer quadrant of left female breast: Secondary | ICD-10-CM | POA: Diagnosis not present

## 2021-06-15 MED ORDER — PREGABALIN 75 MG PO CAPS: 75.0000 mg | ORAL_CAPSULE | Freq: Two times a day (BID) | ORAL | 3 refills | Status: DC

## 2021-06-15 MED ORDER — ROPINIROLE HCL 0.25 MG PO TABS: ORAL_TABLET | ORAL | 1 refills | Status: DC

## 2021-06-15 NOTE — Progress Notes (Signed)
[facility] [place], [place] [address] [phone]   New Patient Evaluation  Date of Service: 06/15/21 Patient Name: Cheryl Blankenship Patient MRN: [id number] Patient DOB: 1951/07/17 Provider: Ventura Sellers, MD  Identifying Statement:  Cheryl Blankenship is a 70 y.o. female with Parkinsonism, unspecified Parkinsonism type ([place])  Chemotherapy-induced neuropathy ([place]) who presents for initial consultation and evaluation regarding cancer associated neurologic deficits.    Referring Provider: Cipriano Mile, NP [place],  [place] [address]  Primary Cancer:  Oncologic History: Oncology History Overview Note  Cancer Staging Malignant neoplasm of upper-outer quadrant of left breast in female, estrogen receptor positive ([place]) Staging form: Breast, AJCC 8th Edition - Clinical stage from 07/16/2020: Stage IB (cT2, cN1, cM0, G2, ER+, PR+, HER2+) - Signed by [name], MD on 07/21/2020    Malignant neoplasm of upper-outer quadrant of left breast in female, estrogen receptor positive ([place])  07/01/2020 Mammogram   IMPRESSION: 1. There is a 12 cm span of highly suspicious calcifications involving the upper outer and upper inner quadrants of the left breast. On ultrasound, within this area of calcifications, there are confluent masses at 1 o'clock spanning at least 4.5 cm. An additional suspicious mass is seen in the left breast at 2 o'clock.  -Ultrasound targeted to the left breast at 1 o'clock, 5 cm from the nipple demonstrates a broad area of hypoechoic irregular tissue/confluent masses spanning at least 4.5 cm. There is a separate oval hypoechoic mass with indistinct margins at 2 o'clock, 7 cm from the nipple measuring 0.9 x 0.4 x 0.9 cm. Ultrasound of the left axilla demonstrates 2 abnormally thickened lymph nodes with cortices measuring 6-7 mm. There is an additional lymph node with a borderline cortex of 4 mm.    2. There are 2 lymph nodes with thickened cortices in the left axilla, and an additional lymph node with a borderline thickened cortex.   07/16/2020 Cancer Staging   Staging form: Breast, AJCC 8th Edition - Clinical stage from 07/16/2020: Stage IIA (cT3, cN1, cM0, G2, ER+, PR+, HER2+) - Signed by [name], MD on 08/05/2020   07/16/2020 Initial Biopsy   Diagnosis 1. 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 Breast, left, needle core biopsy, 1 o'clock - INVASIVE DUCTAL CARCINOMA - SEE COMMENT 3. Lymph node, biopsy - METASTATIC CARCINOMA INVOLVING A LYMPH NODE - SEE COMMENT Microscopic Comment 1. Based on the biopsy, the ductal carcinoma in situ has a comedo pattern, high nuclear grade and measures 0.7 cm in greatest linear extent. Prognostic markers (ER/PR) are pending and will be reported in an addendum. 2. Based on the biopsy, the carcinoma appears Nottingham grade 2 of 3 and measures 1.5 cm in greatest linear extent. Prognostic markers (ER/PR/ki-67/HER2) are pending and will be reported in an addendum. Dr. Saralyn Pilar reviewed the case and agrees with the above diagnosis. These results were called to [facility] on July 17, 2020. 3. Based on the biopsy, the carcinoma appears measures 1.0 cm in greatest linear extent. Prognostic markers (ER/PR/ki-67/HER2) are pending and will be reported in an addendum.   07/16/2020 Receptors her2   1. PROGNOSTIC INDICATORS Results: IMMUNOHISTOCHEMICAL AND MORPHOMETRIC ANALYSIS PERFORMED MANUALLY Estrogen Receptor: 30%, POSITIVE, STRONG STAINING INTENSITY Progesterone Receptor: 5%, POSITIVE, STRONG STAINING INTENSITY  2. PROGNOSTIC INDICATORS Results: IMMUNOHISTOCHEMICAL AND MORPHOMETRIC ANALYSIS PERFORMED MANUALLY The tumor cells are  POSITIVE for Her2 (3+). Estrogen Receptor: 85%, POSITIVE, STRONG STAINING  INTENSITY Progesterone Receptor: 60%, POSITIVE, STRONG STAINING INTENSITY Proliferation Marker Ki67: 25%  3. PROGNOSTIC INDICATORS Results: IMMUNOHISTOCHEMICAL AND MORPHOMETRIC ANALYSIS PERFORMED MANUALLY The tumor cells are POSITIVE for Her2 (3+). Estrogen Receptor: 95%, POSITIVE, STRONG STAINING INTENSITY Progesterone Receptor: 45%, POSITIVE, STRONG STAINING INTENSITY Proliferation Marker Ki67: 30%   07/20/2020 Initial Diagnosis   Malignant neoplasm of upper-outer quadrant of left breast in female, estrogen receptor positive ([place])   07/31/2020 Echocardiogram   Baseline Echo  IMPRESSIONS     1. Left ventricular ejection fraction, by estimation, is 65 to 70%. The  left ventricle has normal function. The left ventricle has no regional  wall motion abnormalities. Left ventricular diastolic parameters are  indeterminate. The average left  ventricular global longitudinal strain is -17.1 %.   2. Right ventricular systolic function is normal. The right ventricular  size is normal. There is normal pulmonary artery systolic pressure.   3. The mitral valve is normal in structure. Trivial mitral valve  regurgitation. No evidence of mitral stenosis.   4. The aortic valve is normal in structure. Aortic valve regurgitation is  not visualized. No aortic stenosis is present.    07/31/2020 Breast MRI   IMPRESSION: 1. Extensive non mass enhancement within the left breast extending from the nipple to the chest wall. Overall findings are most compatible with extensive malignancy involving the majority of the left breast. 2. Indeterminate enhancing mass within the upper inner right breast. 3. Three morphologically abnormal lymph nodes within the left axilla. One of these nodes has been biopsied compatible with metastatic adenopathy.     08/03/2020 Imaging   Bone Scan whole body  IMPRESSION: Increased radiotracer uptake in the mid to lower lumbar regions of uncertain etiology. Correlation  with radiography to assess for potential arthropathy in these areas advised. Increased uptake in major joints is likely of arthropathic etiology. Increased uptake in the mid face is likely due to paranasal sinus disease. Distribution of radiotracer uptake in bony structures elsewhere unremarkable.   08/04/2020 Procedure   PAC placement by Dr [name]   08/05/2020 - 10/28/2020 Neo-Adjuvant Chemotherapy   Neoadjuvant THP q3weeks for 4-6 cycles starting 08/05/20, followed by Herceptin or Kadcyla q3weeks to complete 1 year of treatment. Stopped THP after 1 dose due to poor toleration       -I switched her to [place] starting 08/25/20. Stopped after C2 due to poor toleration (10/28/20)   08/07/2020 Imaging   CT CAP  IMPRESSION: 2.7 cm mass in the upper outer left breast, likely corresponding to the patient's known primary breast neoplasm.   8 mm short axis left axillary node, suspicious for nodal metastasis in this clinical context.   No findings suspicious for distant metastasis.   Endometrium is mildly prominent, measuring 11 mm. Correlate for vaginal bleeding and consider pelvic ultrasound for further evaluation if clinically warranted.   08/11/2020 Pathology Results   Diagnosis Breast, right, needle core biopsy, right breast - PAPILLARY LESION - SEE COMMENT Microscopic Comment The biopsy consists of a papillary lesion with a focal area of adenosis. Immunohistochemistry (SMM, calponin and p63) shows retention of the myoepithelial cell layer in the focus of adenosis. 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 These results were called to [facility] on August 12, 2020.   10/27/2020 Imaging   CT Angio chest  IMPRESSION: Slightly suboptimal opacification of the main pulmonary artery, however no central or proximal segmental pulmonary  embolism   Minimal bibasilar atelectasis   Aortic Atherosclerosis (ICD10-I70.0).   10/31/2020 Imaging    CT AP  IMPRESSION: 1. No acute finding by CT other than a large amount of stool and gas in the right colon and gas in the transverse colon. No small bowel obstruction. 2. Previous cholecystectomy. 3. Aortic atherosclerosis. 4. Umbilical hernia containing only fat. 5. Small amount of fluid in the endometrial canal as seen previously.   Aortic Atherosclerosis (ICD10-I70.0).   12/04/2020 Imaging   MRI Breast IMPRESSION: 1. Evidence of a positive response to interval treatment. There has been a significant improvement of both the extent and strength of the enhancement throughout the outer LEFT breast when compared to the earlier MRI of 07/31/2020. 2. However, there remains scattered areas of mass and non-mass enhancement throughout the outer LEFT breast, involving the upper outer quadrant and lower outer quadrant, as detailed above. 3. The 3 morphologically abnormal lymph nodes in the LEFT axilla identified on previous MRI, 1 a biopsy-proven metastasis, are all slightly smaller indicating an additional positive response to interval treatment. 4. No evidence of malignancy within the RIGHT breast.   12/10/2020 -  Chemotherapy    Patient is on Treatment Plan: BREAST TRASTUZUMAB + PERJETA (05/06/21) Q21D       12/30/2020 Surgery   A. BREAST, LEFT, MASTECTOMY:  - Focal residual invasive ductal carcinoma status post neoadjuvant  treatment.  - Residual ductal carcinoma in situ.  - Margins of resection are not involved.  - Biopsy clip (X2).   B. LYMPH NODES, LEFT #1, DISSECTION:  - Three lymph nodes, negative for carcinoma (0/3).  - Biopsy clip (X1).   C. LYMPH NODES, LEFT #2, DISSECTION:  - Two lymph nodes, negative for carcinoma (0/2).   PROGNOSTIC INDICATOR RESULTS:  - The tumor cells are POSITIVE for Her2 (3+).  - Estrogen Receptor:       POSITIVE, 90%, STRONG STAINING  - Progesterone Receptor:   POSITIVE, 50%, MODERATE STAINING    12/30/2020 Cancer Staging   Staging  form: Breast, AJCC 8th Edition - Pathologic stage from 12/30/2020: No Stage Recommended (ypT1a, pN0, cM0, G2, ER+, PR+, HER2+) - Signed by [name], MD on 01/20/2021 Stage prefix: Post-therapy Histologic grading system: 3 grade system Residual tumor (R): R0 - None   02/17/2021 - 04/08/2021 Radiation Therapy    Site Technique Total Dose (Gy) Dose per Fx (Gy) Completed Fx Beam Energies  Chest Wall, Left: CW_Lt 3D 50.4/50.4 1.8 28/28 10X  Chest Wall, Left: CW_Lt_SCLV 3D 50.4/50.4 1.8 28/28 6X, 15X  Chest Wall, Left: CW_Lt_Bst Electron 10/10 2 5/5 9E      History of Present Illness: The patient's records from the referring physician were obtained and reviewed and the patient interviewed to confirm this HPI.  Cheryl Blankenship presents today with neurologic complaints.  She describes several months history of gait imbalance, slow movement, and tremor in her right hand and mouth.  She has had several falls, always falls backwards.  Currently she is walking with a walker because of gait impairment.  Overall she has slowed down with movements, and also handwriting and speech.  She also describes several episodes of loss of consciousness.  This is described as brief LOC with quick return to baseline awareness and cognition.  No aura or post-ictal confusion.  She also complains of numbness and burning pain in feet since undergoing chemo treatments, gabapentin 424m TID has not been effective. Currently on chemotherapy with Dr. FBurr Medicofor metastatic breast cancer.  Medications: Current Outpatient Medications on File Prior to Visit  Medication Sig Dispense Refill   Accu-Chek FastClix Lancets MISC 1 each by Other route in the morning, at noon, and at bedtime.     albuterol (VENTOLIN HFA) 108 (90 Base) MCG/ACT inhaler Inhale 1-2 puffs into the lungs every 4 (four) hours as needed for wheezing or shortness of breath. 8 g 0   ALPRAZolam (XANAX) 0.25 MG tablet Take 1-2 tablets (0.25-0.5 mg total) by mouth 3 (three)  times daily as needed for anxiety or sleep. 30 tablet 0   amLODipine (NORVASC) 10 MG tablet Take 1 tablet (10 mg total) by mouth daily. 90 tablet 3   aspirin 81 MG chewable tablet Chew 81 mg by mouth daily.      bisacodyl (DULCOLAX) 5 MG EC tablet Take 1 tablet (5 mg total) by mouth daily as needed for moderate constipation. 30 tablet 0   carvedilol (COREG) 25 MG tablet Take 1 tablet (25 mg total) by mouth 2 (two) times daily with a meal. 180 tablet 3   Cholecalciferol 25 MCG (1000 UT) capsule Take 1,000 Units by mouth daily.     diphenoxylate-atropine (LOMOTIL) 2.5-0.025 MG tablet TAKE 1 OR 2 TABLETS BY MOUTH 4 TIMES DAILY AS NEEDED FOR DIARRHEA OR loose stools 60 tablet 2   DULoxetine (CYMBALTA) 60 MG capsule Take 60 mg by mouth daily.     exemestane (AROMASIN) 25 MG tablet Take 1 tablet (25 mg total) by mouth daily after breakfast. 90 tablet 2   folic acid (FOLVITE) 1 MG tablet Take 1 mg by mouth daily.     furosemide (LASIX) 20 MG tablet Take 1 tablet (20 mg total) by mouth daily as needed (for swelling or weight gain). 90 tablet 3   glipiZIDE (GLUCOTROL) 10 MG tablet Take 10 mg by mouth daily before breakfast.     insulin aspart (NOVOLOG) 100 UNIT/ML injection Inject 2-10 Units into the skin 3 (three) times daily before meals. Per sliding scale:  150-199 = 2 units, 200-249 = 4 units, 250-299 = 6 units, 300-349 = 8 units, greater than350 = 10 units.     insulin glargine (LANTUS SOLOSTAR) 100 UNIT/ML Solostar Pen Inject 35 Units into the skin 2 (two) times daily. (Patient taking differently: Inject 70 Units into the skin 2 (two) times daily. Pt takes 70 units in the morning and 70 units at night.) 15 mL 11   lidocaine-prilocaine (EMLA) cream Apply 1 application topically as needed (pain). (Patient taking differently: Apply 1 application topically daily.) 30 g 3   losartan (COZAAR) 100 MG tablet Take 1 tablet (100 mg total) by mouth daily. 90 tablet 3   metFORMIN (GLUCOPHAGE) 500 MG tablet Take 500  mg by mouth 2 (two) times daily with a meal.      methocarbamol (ROBAXIN) 500 MG tablet Take 500 mg by mouth every 8 (eight) hours as needed for muscle spasms.     Omega-3 Fatty Acids (FISH OIL) 1000 MG CAPS Take 1,000 mg by mouth daily.     omeprazole (PRILOSEC) 20 MG capsule Take 1 capsule (20 mg total) by mouth daily. 30 capsule 11   ondansetron (ZOFRAN-ODT) 4 MG disintegrating tablet TAKE 1 TABLET BY MOUTH 2 TIMES DAILY AS NEEDED FOR NAUSEA AND VOMITING 30 tablet 2   prochlorperazine (COMPAZINE) 10 MG tablet Take 1 tablet (10 mg total) by mouth every 6 (six) hours as needed for nausea or vomiting. 30 tablet 5   PROMETHEGAN 25 MG suppository place 1 SUPPOSITORY  rectally EVERY 8 HOURS AS NEEDED FOR NAUSEA AND VOMITING 30 suppository 1   RESTASIS 0.05 % ophthalmic emulsion Place 1 drop into both eyes 2 (two) times daily as needed (dry eyes).     spironolactone (ALDACTONE) 25 MG tablet Take 1 tablet (25 mg total) by mouth daily. 90 tablet 3   sucralfate (CARAFATE) 1 GM/10ML suspension Take 10 mLs (1 g total) by mouth 4 (four) times daily -  with meals and at bedtime. 420 mL 0   vitamin B-12 (CYANOCOBALAMIN) 1000 MCG tablet Take 1,000 mcg by mouth daily.     zolpidem (AMBIEN) 5 MG tablet Take 1 tablet (5 mg total) by mouth at bedtime as needed for sleep. 20 tablet 0   No current facility-administered medications on file prior to visit.    Allergies:  Allergies  Allergen Reactions   Coconut (Cocos Nucifera) Allergy Skin Test     Hives and swells mouth   Hydrochlorothiazide Swelling    tongue   Lisinopril Swelling    Angioedema. Tolerates Losartan.   Coconut Oil Hives   Past Medical History:  Past Medical History:  Diagnosis Date   Anxiety    Breast cancer ([place])    CHF (congestive heart failure) ([place])    Depression    Diabetes mellitus without complication ([place])    type 2   GERD (gastroesophageal reflux disease)    History of kidney stones    Hypertension    PONV (postoperative  nausea and vomiting)    Stroke [facility])    Past Surgical History:  Past Surgical History:  Procedure Laterality Date   APPENDECTOMY     CHOLECYSTECTOMY     COLONOSCOPY     ESOPHAGOGASTRODUODENOSCOPY (EGD) WITH PROPOFOL N/A 11/01/2020   Procedure: ESOPHAGOGASTRODUODENOSCOPY (EGD) WITH PROPOFOL;  Surgeon: Arta Silence, MD;  Location: [facility];  Service: Endoscopy;  Laterality: N/A;   EYE SURGERY     cataracts   MASTECTOMY WITH AXILLARY LYMPH NODE DISSECTION Left 12/30/2020   Procedure: LEFT MASTECTOMY WITH TARGETED AXILLARY LYMPH NODE DISSECTION;  Surgeon: [name], MD;  Location: [facility];  Service: General;  Laterality: Left;   MULTIPLE TOOTH EXTRACTIONS     PORTACATH PLACEMENT N/A 08/04/2020   Procedure: INSERTION PORT-A-CATH WITH ULTRASOUND GUIDANCE;  Surgeon: [name], MD;  Location: [facility];  Service: General;  Laterality: N/A;   STOMACH SURGERY     Social History:  Social History   Socioeconomic History   Marital status: Married    Spouse name: Not on file   Number of children: 2   Years of education: Not on file   Highest education level: Not on file  Occupational History   Occupation: retired Marine scientist aid  Tobacco Use   Smoking status: Former    Packs/day: 3.00    Years: 30.00    Pack years: 90.00    Types: Cigarettes   Smokeless tobacco: Never  Vaping Use   Vaping Use: Never used  Substance and Sexual Activity   Alcohol use: Not Currently    Comment: 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   Drug use: Never   Sexual activity: Not on file  Other Topics Concern   Not on file  Social History Narrative   Not on file   Social Determinants of Health   Financial Resource Strain: Not on file  Food Insecurity: Not on file  Transportation Needs: Not on file  Physical Activity: Not on file  Stress: Not on file  Social Connections: Not on file  Intimate Partner Violence: Not on file   Family History:  Family History  Problem Relation Age of Onset    Heart attack Sister    Diabetes Sister    Heart attack Mother    Ovarian cancer Mother 47   Heart attack Brother    Cancer Niece        bone cancer    Cancer Cousin        unknown type cancer    Cancer Cousin        metastatic cancer to bone     Review of Systems: Constitutional: Doesn't report fevers, chills or abnormal weight loss Eyes: Doesn't report blurriness of vision Ears, nose, mouth, throat, and face: Doesn't report sore throat Respiratory: Doesn't report cough, dyspnea or wheezes Cardiovascular: Doesn't report palpitation, chest discomfort  Gastrointestinal:  Doesn't report nausea, constipation, diarrhea GU: Doesn't report incontinence Skin: Doesn't report skin rashes Neurological: Per HPI Musculoskeletal: Doesn't report joint pain Behavioral/Psych: Doesn't report anxiety  Physical Exam: Vitals:   06/15/21 1119  BP: (!) 141/67  Pulse: 68  Resp: 18  Temp: 98.3 F (36.8 C)  SpO2: 98%   KPS: 70. General: Alert, cooperative, pleasant, in no acute distress Head: Normal EENT: No conjunctival injection or scleral icterus.  Lungs: Resp effort normal Cardiac: Regular rate Abdomen: Non-distended abdomen Skin: No rashes cyanosis or petechiae. Extremities: No clubbing or edema  Neurologic Exam: Mental Status: Awake, alert, attentive to examiner. Oriented to self and environment. Language is fluent with intact comprehension.  Cranial Nerves: Visual acuity is grossly normal. Visual fields are full. Extra-ocular movements intact. No ptosis. Face is symmetric Motor: Tone and bulk are normal. Power is full in both arms and legs. Reflexes are symmetric, no pathologic reflexes present.  Noted bradykinesia, diffuse.  Noted pill rolling rest tremor in right hand. Sensory: Intact to light touch Gait: Bradykinetic, dystaxic   Labs: I have reviewed the data as listed    Component Value Date/Time   NA 141 05/27/2021 1302   K 3.8 05/27/2021 1302   CL 100 05/27/2021 1302    CO2 29 05/27/2021 1302   GLUCOSE 236 (H) 05/27/2021 1302   BUN 30 (H) 05/27/2021 1302   CREATININE 1.41 (H) 05/27/2021 1302   CALCIUM 10.6 (H) 05/27/2021 1302   CALCIUM 11.0 (H) 05/27/2021 1302   PROT 7.5 05/27/2021 1302   ALBUMIN 3.5 05/27/2021 1302   AST 11 (L) 05/27/2021 1302   ALT 15 05/27/2021 1302   ALKPHOS 71 05/27/2021 1302   BILITOT 0.4 05/27/2021 1302   GFRNONAA 41 (L) 05/27/2021 1302   Lab Results  Component Value Date   WBC 5.7 05/27/2021   NEUTROABS 4.2 05/27/2021   HGB 10.8 (L) 05/27/2021   HCT 34.0 (L) 05/27/2021   MCV 88.3 05/27/2021   PLT 411 (H) 05/27/2021    Imaging:  CT Angio Chest Pulmonary Embolism (PE) W or WO Contrast  Result Date: 05/22/2021 CLINICAL DATA:  PE suspected, low/intermediate prob, positive D-dimer. Chest pain, dyspnea. EXAM: CT ANGIOGRAPHY CHEST WITH CONTRAST TECHNIQUE: Multidetector CT imaging of the chest was performed using the standard protocol during bolus administration of intravenous contrast. Multiplanar CT image reconstructions and MIPs were obtained to evaluate the vascular anatomy. CONTRAST:  29m OMNIPAQUE IOHEXOL 350 MG/ML SOLN COMPARISON:  10/27/2020 FINDINGS: Cardiovascular: There is adequate opacification of the pulmonary arterial tree. There is no intraluminal filling defect identified to suggest acute pulmonary embolism. The central pulmonary arteries are of normal caliber. Mild coronary artery calcification. Global cardiac size within  normal limits. No pericardial effusion. The thoracic aorta is of normal caliber. Mediastinum/Nodes: No enlarged mediastinal, hilar, or axillary lymph nodes. Thyroid gland, trachea, and esophagus demonstrate no significant findings. Lungs/Pleura: Benign calcified granuloma within the a left upper lobe. The lungs are otherwise clear. No pneumothorax or pleural effusion. Central airways are widely patent. Upper Abdomen: No acute abnormality. Musculoskeletal: No acute bone abnormality. No lytic or  blastic bone lesion. Interval left mastectomy and axillary lymph node dissection. A relatively thick walled loculated subcutaneous fluid collection is seen within the resection bed measuring 2.7 x 9.6 cm possibly representing a postoperative seroma or chronic hematoma. Super infection is not excluded on this examination. Mild subcutaneous infiltration within this region and associated dermal thickening may be postoperative in nature or inflammatory. Review of the MIP images confirms the above findings. IMPRESSION: No pulmonary embolism. Interval left mastectomy and axillary lymph node dissection. 9.6 cm subcutaneous, thick walled fluid collection within the a resection bed may represent a postoperative seroma or chronic hematoma. Superinfection, however, is not excluded on this examination. Surrounding inflammatory changes within the subcutaneous soft tissues and associated dermal thickening may be inflammatory or postoperative in nature. Correlation with clinical examination is recommended. Mild coronary artery calcification. Electronically Signed   By: Fidela Salisbury M.D.   On: 05/22/2021 21:43   MR Brain W Wo Contrast  Result Date: 05/28/2021 CLINICAL DATA:  Malignant neoplasm of upper-outer quadrant of left breast in female, estrogen receptor positive ([place]) C50.412, Z17.0 (ICD-10-CM). Syncope, recurrent. Additional history provided by scanning technologist: History of left breast cancer. Recent syncopal episode. EXAM: MRI HEAD WITHOUT AND WITH CONTRAST TECHNIQUE: Multiplanar, multiecho pulse sequences of the brain and surrounding structures were obtained without and with intravenous contrast. CONTRAST:  49m GADAVIST GADOBUTROL 1 MMOL/ML IV SOLN COMPARISON:  No pertinent prior exams available for comparison. FINDINGS: Brain: Cerebral volume is normal for age. Mild multifocal T2/hyperintensity within the cerebral white matter, nonspecific but compatible with chronic small vessel ischemic disease. Tiny chronic  infarct within the left cerebellar hemisphere (series 8, image 9). There is no acute infarct. No evidence of an intracranial mass. No chronic intracranial blood products. No extra-axial fluid collection. No midline shift. No pathologic intracranial enhancement. Vascular: Maintained flow voids within the proximal large arterial vessels. Skull and upper cervical spine: No focal suspicious marrow lesion. Incompletely assessed cervical spondylosis. A C4-C5 disc bulge contributes to at least mild spinal canal stenosis. Sinuses/Orbits: Visualized orbits show no acute finding. Bilateral lens replacements. Other: Right mastoid effusion/trapped fluid within the right petrous apex. Mucous retention cyst within the right posterior nasopharynx. IMPRESSION: No evidence of acute intracranial abnormality. No evidence of intracranial metastatic disease. Mild chronic small-vessel ischemic changes within the cerebral white matter. 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 Right mastoid effusion/trapped fluid within the right petrous apex. Incompletely assessed cervical spondylosis. A C4-C5 disc bulge contributes to at least mild spinal canal stenosis. Electronically Signed   By: KKellie SimmeringD.O.   On: 05/28/2021 17:53   DG Chest Port 1 View  Result Date: 05/22/2021 CLINICAL DATA:  Chest pain and shortness of breath. EXAM: PORTABLE CHEST 1 VIEW COMPARISON:  October 26, 2020. FINDINGS: Right chest wall Port-A-Cath with tip at the superior cavoatrial junction. Left axillary surgical clips. The heart size and mediastinal contours are unchanged. No focal consolidation. No pleural effusion. No pneumothorax. Degenerative changes bilateral shoulders. Thoracic spondylosis. IMPRESSION: No active cardiopulmonary disease. Electronically Signed   By: JDahlia BailiffM.D.   On: 05/22/2021 16:09  Assessment/Plan Parkinsonism, unspecified Parkinsonism type ([place])  Chemotherapy-induced neuropathy ([place])  Calvin Chura presents with clinical syndrome consistent with parkinsonism, given characteristic tremor, bradykinesia, dystaxia.  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    We discussed clinical symptoms, pathology.  We reviewed her brain MRI, which is normal.    We recommended initiating trial of dopamine agonist, ropinorole.  Will start with 0.16m TID, then increase by 0.751mdaily each week until at 76m57mID by week 4.    For neuropathy, will transition to Lyrica 51m476mD given failure of gabapentin.      Do not suspect neurologic etiology of syncope; she is being evaluated by cardiology currently.   We spent twenty additional minutes teaching regarding the natural history, biology, and historical experience in the treatment of neurologic complications of cancer.   We appreciate the opportunity to participate in the care of Cheryl Blankenship will call her in 3-4 weeks to assess response to dopamine agonist trial and Lyrica.  All questions were answered. The patient knows to call the clinic with any problems, questions or concerns. No barriers to learning were detected.  The total time spent in the encounter was 40 minutes and more than 50% was on counseling and review of test results   ZachVentura Sellers Medical Director of Neuro-Oncology [facility]27/22 1:09 PM

## 2021-06-16 ENCOUNTER — Telehealth: Payer: Self-pay | Admitting: Internal Medicine

## 2021-06-16 NOTE — Telephone Encounter (Signed)
Scheduled appt per 9/27 los. Called pt, no answer. Left msg with appt date and time.

## 2021-06-20 NOTE — Progress Notes (Signed)
[facility]   [phone] [phone]   Clinic Follow up Note   Patient Care Team: Cipriano Mile, NP as PCP - General [name], RN as Oncology Nurse Navigator [name], RN as Oncology Nurse Navigator [name], MD as Consulting Physician (General Surgery) [name], MD as Consulting Physician (Hematology) [name], MD as Consulting Physician (Radiation Oncology) [name], [name], MD as Consulting Physician (Cardiology) 06/22/2021  CHIEF COMPLAINT: Follow up breast cancer   SUMMARY OF ONCOLOGIC HISTORY: Oncology History Overview Note  Cancer Staging Malignant neoplasm of upper-outer quadrant of left breast in female, estrogen receptor positive ([place]) Staging form: Breast, AJCC 8th Edition - Clinical stage from 07/16/2020: Stage IB (cT2, cN1, cM0, G2, ER+, PR+, HER2+) - Signed by [name], MD on 07/21/2020    Malignant neoplasm of upper-outer quadrant of left breast in female, estrogen receptor positive ([place])  07/01/2020 Mammogram   IMPRESSION: 1. There is a 12 cm span of highly suspicious calcifications involving the upper outer and upper inner quadrants of the left breast. On ultrasound, within this area of calcifications, there are confluent masses at 1 o'clock spanning at least 4.5 cm. An additional suspicious mass is seen in the left breast at 2 o'clock.  -Ultrasound targeted to the left breast at 1 o'clock, 5 cm from the nipple demonstrates a broad area of hypoechoic irregular tissue/confluent masses spanning at least 4.5 cm. There is a separate oval hypoechoic mass with indistinct margins at 2 o'clock, 7 cm from the nipple measuring 0.9 x 0.4 x 0.9 cm. Ultrasound of the left axilla demonstrates 2 abnormally thickened lymph nodes with cortices measuring 6-7 mm. There is an additional lymph node with a borderline cortex of 4 mm.   2. There are 2 lymph nodes with thickened cortices in the left axilla, and an  additional lymph node with a borderline thickened cortex.   07/16/2020 Cancer Staging   Staging form: Breast, AJCC 8th Edition - Clinical stage from 07/16/2020: Stage IIA (cT3, cN1, cM0, G2, ER+, PR+, HER2+) - Signed by [name], MD on 08/05/2020   07/16/2020 Initial Biopsy   Diagnosis 1. 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 Breast, left, needle core biopsy, 1 o'clock - INVASIVE DUCTAL CARCINOMA - SEE COMMENT 3. Lymph node, biopsy - METASTATIC CARCINOMA INVOLVING A LYMPH NODE - SEE COMMENT Microscopic Comment 1. Based on the biopsy, the ductal carcinoma in situ has a comedo pattern, high nuclear grade and measures 0.7 cm in greatest linear extent. Prognostic markers (ER/PR) are pending and will be reported in an addendum. 2. Based on the biopsy, the carcinoma appears Nottingham grade 2 of 3 and measures 1.5 cm in greatest linear extent. Prognostic markers (ER/PR/ki-67/HER2) are pending and will be reported in an addendum. Dr. Saralyn Pilar reviewed the case and agrees with the above diagnosis. These results were called to [facility] on July 17, 2020. 3. Based on the biopsy, the carcinoma appears measures 1.0 cm in greatest linear extent. Prognostic markers (ER/PR/ki-67/HER2) are pending and will be reported in an addendum.   07/16/2020 Receptors her2   1. PROGNOSTIC INDICATORS Results: IMMUNOHISTOCHEMICAL AND MORPHOMETRIC ANALYSIS PERFORMED MANUALLY Estrogen Receptor: 30%, POSITIVE, STRONG STAINING INTENSITY Progesterone Receptor: 5%, POSITIVE, STRONG STAINING INTENSITY  2. PROGNOSTIC INDICATORS Results: IMMUNOHISTOCHEMICAL AND MORPHOMETRIC ANALYSIS PERFORMED MANUALLY The tumor cells are POSITIVE for Her2 (3+). Estrogen Receptor: 85%, POSITIVE, STRONG STAINING  INTENSITY Progesterone Receptor: 60%, POSITIVE, STRONG STAINING INTENSITY Proliferation  Marker Ki67: 25%  3. PROGNOSTIC INDICATORS Results: IMMUNOHISTOCHEMICAL AND MORPHOMETRIC ANALYSIS PERFORMED MANUALLY The tumor cells are POSITIVE for Her2 (3+). Estrogen Receptor: 95%, POSITIVE, STRONG STAINING INTENSITY Progesterone Receptor: 45%, POSITIVE, STRONG STAINING INTENSITY Proliferation Marker Ki67: 30%   07/20/2020 Initial Diagnosis   Malignant neoplasm of upper-outer quadrant of left breast in female, estrogen receptor positive ([place])   07/31/2020 Echocardiogram   Baseline Echo  IMPRESSIONS     1. Left ventricular ejection fraction, by estimation, is 65 to 70%. The  left ventricle has normal function. The left ventricle has no regional  wall motion abnormalities. Left ventricular diastolic parameters are  indeterminate. The average left  ventricular global longitudinal strain is -17.1 %.   2. Right ventricular systolic function is normal. The right ventricular  size is normal. There is normal pulmonary artery systolic pressure.   3. The mitral valve is normal in structure. Trivial mitral valve  regurgitation. No evidence of mitral stenosis.   4. The aortic valve is normal in structure. Aortic valve regurgitation is  not visualized. No aortic stenosis is present.    07/31/2020 Breast MRI   IMPRESSION: 1. Extensive non mass enhancement within the left breast extending from the nipple to the chest wall. Overall findings are most compatible with extensive malignancy involving the majority of the left breast. 2. Indeterminate enhancing mass within the upper inner right breast. 3. Three morphologically abnormal lymph nodes within the left axilla. One of these nodes has been biopsied compatible with metastatic adenopathy.     08/03/2020 Imaging   Bone Scan whole body  IMPRESSION: Increased radiotracer uptake in the mid to lower lumbar regions of uncertain etiology. Correlation with radiography to assess for potential arthropathy in these areas advised. Increased  uptake in major joints is likely of arthropathic etiology. Increased uptake in the mid face is likely due to paranasal sinus disease. Distribution of radiotracer uptake in bony structures elsewhere unremarkable.   08/04/2020 Procedure   PAC placement by Dr [name]   08/05/2020 - 10/28/2020 Neo-Adjuvant Chemotherapy   Neoadjuvant THP q3weeks for 4-6 cycles starting 08/05/20, followed by Herceptin or Kadcyla q3weeks to complete 1 year of treatment. Stopped THP after 1 dose due to poor toleration       -I switched her to [place] starting 08/25/20. Stopped after C2 due to poor toleration (10/28/20)   08/07/2020 Imaging   CT CAP  IMPRESSION: 2.7 cm mass in the upper outer left breast, likely corresponding to the patient's known primary breast neoplasm.   8 mm short axis left axillary node, suspicious for nodal metastasis in this clinical context.   No findings suspicious for distant metastasis.   Endometrium is mildly prominent, measuring 11 mm. Correlate for vaginal bleeding and consider pelvic ultrasound for further evaluation if clinically warranted.   08/11/2020 Pathology Results   Diagnosis Breast, right, needle core biopsy, right breast - PAPILLARY LESION - SEE COMMENT Microscopic Comment The biopsy consists of a papillary lesion with a focal area of adenosis. Immunohistochemistry (SMM, calponin and p63) shows retention of the myoepithelial cell layer in the focus of adenosis. 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 These results were called to [facility] on August 12, 2020.   10/27/2020 Imaging   CT Angio chest  IMPRESSION: Slightly suboptimal opacification of the main pulmonary artery, however no central or proximal segmental pulmonary embolism   Minimal bibasilar atelectasis   Aortic Atherosclerosis (  ICD10-I70.0).   10/31/2020 Imaging   CT AP  IMPRESSION: 1. No acute finding by CT other than a large amount of stool and  gas in the right colon and gas in the transverse colon. No small bowel obstruction. 2. Previous cholecystectomy. 3. Aortic atherosclerosis. 4. Umbilical hernia containing only fat. 5. Small amount of fluid in the endometrial canal as seen previously.   Aortic Atherosclerosis (ICD10-I70.0).   12/04/2020 Imaging   MRI Breast IMPRESSION: 1. Evidence of a positive response to interval treatment. There has been a significant improvement of both the extent and strength of the enhancement throughout the outer LEFT breast when compared to the earlier MRI of 07/31/2020. 2. However, there remains scattered areas of mass and non-mass enhancement throughout the outer LEFT breast, involving the upper outer quadrant and lower outer quadrant, as detailed above. 3. The 3 morphologically abnormal lymph nodes in the LEFT axilla identified on previous MRI, 1 a biopsy-proven metastasis, are all slightly smaller indicating an additional positive response to interval treatment. 4. No evidence of malignancy within the RIGHT breast.   12/10/2020 -  Chemotherapy   Patient is on Treatment Plan : BREAST Trastuzumab + Perjeta (05/06/21) q21d     12/30/2020 Surgery   A. BREAST, LEFT, MASTECTOMY:  - Focal residual invasive ductal carcinoma status post neoadjuvant  treatment.  - Residual ductal carcinoma in situ.  - Margins of resection are not involved.  - Biopsy clip (X2).   B. LYMPH NODES, LEFT #1, DISSECTION:  - Three lymph nodes, negative for carcinoma (0/3).  - Biopsy clip (X1).   C. LYMPH NODES, LEFT #2, DISSECTION:  - Two lymph nodes, negative for carcinoma (0/2).   PROGNOSTIC INDICATOR RESULTS:  - The tumor cells are POSITIVE for Her2 (3+).  - Estrogen Receptor:       POSITIVE, 90%, STRONG STAINING  - Progesterone Receptor:   POSITIVE, 50%, MODERATE STAINING    12/30/2020 Cancer Staging   Staging form: Breast, AJCC 8th Edition - Pathologic stage from 12/30/2020: No Stage Recommended (ypT1a,  pN0, cM0, G2, ER+, PR+, HER2+) - Signed by [name], MD on 01/20/2021 Stage prefix: Post-therapy Histologic grading system: 3 grade system Residual tumor (R): R0 - None   02/17/2021 - 04/08/2021 Radiation Therapy    Site Technique Total Dose (Gy) Dose per Fx (Gy) Completed Fx Beam Energies  Chest Wall, Left: CW_Lt 3D 50.4/50.4 1.8 28/28 10X  Chest Wall, Left: CW_Lt_SCLV 3D 50.4/50.4 1.8 28/28 6X, 15X  Chest Wall, Left: CW_Lt_Bst Electron 10/10 2 5/5 9E      CURRENT THERAPY:  Exemestane  Kanjinti q3 weeks, perjeta discontinued   INTERVAL HISTORY: Ms. Lopata returns for follow up and treatment as scheduled. She has been off exemestane and kanjinti for a few weeks due to general symptoms. She was seen by Dr. Mickeal Skinner who diagnosed her with parkinson's disease and started Requip and Lyrica. She was also seen by Dr. Audie Box in cardiology who felt her syncope was secondary to vasovagal from dehydration/poor po intake. EKG was unremarkable.  She feels better in general, no recent fall or syncopal episode.  She is starting to eat and drink a little more.  She has mild constipation.  She notes that she has been off exemestane in the last few months.  She has firmness in the left chest wall, skin is still dark.  Otherwise, she denies pain, fever, chills, shortness of breath, or any other specific complaints.   MEDICAL HISTORY:  Past Medical History:  Diagnosis Date  Anxiety    Breast cancer ([place])    CHF (congestive heart failure) ([facility])    Depression    Diabetes mellitus without complication ([place])    type 2   GERD (gastroesophageal reflux disease)    History of kidney stones    Hypertension    PONV (postoperative nausea and vomiting)    Stroke ([place])     SURGICAL HISTORY: Past Surgical History:  Procedure Laterality Date   APPENDECTOMY     CHOLECYSTECTOMY     COLONOSCOPY     ESOPHAGOGASTRODUODENOSCOPY (EGD) WITH PROPOFOL N/A 11/01/2020   Procedure: ESOPHAGOGASTRODUODENOSCOPY (EGD) WITH  PROPOFOL;  Surgeon: Arta Silence, MD;  Location: [facility];  Service: Endoscopy;  Laterality: N/A;   EYE SURGERY     cataracts   MASTECTOMY WITH AXILLARY LYMPH NODE DISSECTION Left 12/30/2020   Procedure: LEFT MASTECTOMY WITH TARGETED AXILLARY LYMPH NODE DISSECTION;  Surgeon: [name], MD;  Location: [facility];  Service: General;  Laterality: Left;   MULTIPLE TOOTH EXTRACTIONS     PORTACATH PLACEMENT N/A 08/04/2020   Procedure: INSERTION PORT-A-CATH WITH ULTRASOUND GUIDANCE;  Surgeon: [name], MD;  Location: [facility];  Service: General;  Laterality: N/A;   STOMACH SURGERY      I have reviewed the social history and family history with the patient and they are unchanged from previous note.  ALLERGIES:  is allergic to coconut (cocos nucifera) allergy skin test, hydrochlorothiazide, lisinopril, and coconut oil.  MEDICATIONS:  Current Outpatient Medications  Medication Sig Dispense Refill   Accu-Chek FastClix Lancets MISC 1 each by Other route in the morning, at noon, and at bedtime.     albuterol (VENTOLIN HFA) 108 (90 Base) MCG/ACT inhaler Inhale 1-2 puffs into the lungs every 4 (four) hours as needed for wheezing or shortness of breath. 8 g 0   ALPRAZolam (XANAX) 0.25 MG tablet Take 1-2 tablets (0.25-0.5 mg total) by mouth 3 (three) times daily as needed for anxiety or sleep. 30 tablet 0   amLODipine (NORVASC) 10 MG tablet Take 1 tablet (10 mg total) by mouth daily. 90 tablet 3   aspirin 81 MG chewable tablet Chew 81 mg by mouth daily.      bisacodyl (DULCOLAX) 5 MG EC tablet Take 1 tablet (5 mg total) by mouth daily as needed for moderate constipation. 30 tablet 0   carvedilol (COREG) 25 MG tablet Take 1 tablet (25 mg total) by mouth 2 (two) times daily with a meal. 180 tablet 3   Cholecalciferol 25 MCG (1000 UT) capsule Take 1,000 Units by mouth daily.     diphenoxylate-atropine (LOMOTIL) 2.5-0.025 MG tablet TAKE 1 OR 2 TABLETS BY MOUTH 4 TIMES DAILY AS NEEDED FOR DIARRHEA OR  loose stools 60 tablet 2   DULoxetine (CYMBALTA) 60 MG capsule Take 60 mg by mouth daily.     exemestane (AROMASIN) 25 MG tablet Take 1 tablet (25 mg total) by mouth daily after breakfast. 90 tablet 2   folic acid (FOLVITE) 1 MG tablet Take 1 mg by mouth daily.     furosemide (LASIX) 20 MG tablet Take 1 tablet (20 mg total) by mouth daily as needed (for swelling or weight gain). 90 tablet 3   glipiZIDE (GLUCOTROL) 10 MG tablet Take 10 mg by mouth daily before breakfast.     insulin aspart (NOVOLOG) 100 UNIT/ML injection Inject 2-10 Units into the skin 3 (three) times daily before meals. Per sliding scale:  150-199 = 2 units, 200-249 = 4 units, 250-299 = 6 units, 300-349 =  8 units, greater than350 = 10 units.     insulin glargine (LANTUS SOLOSTAR) 100 UNIT/ML Solostar Pen Inject 35 Units into the skin 2 (two) times daily. (Patient taking differently: Inject 70 Units into the skin 2 (two) times daily. Pt takes 70 units in the morning and 70 units at night.) 15 mL 11   lidocaine-prilocaine (EMLA) cream Apply 1 application topically as needed (pain). (Patient taking differently: Apply 1 application topically daily.) 30 g 3   losartan (COZAAR) 100 MG tablet Take 1 tablet (100 mg total) by mouth daily. 90 tablet 3   metFORMIN (GLUCOPHAGE) 500 MG tablet Take 500 mg by mouth 2 (two) times daily with a meal.      methocarbamol (ROBAXIN) 500 MG tablet Take 500 mg by mouth every 8 (eight) hours as needed for muscle spasms.     Omega-3 Fatty Acids (FISH OIL) 1000 MG CAPS Take 1,000 mg by mouth daily.     omeprazole (PRILOSEC) 20 MG capsule Take 1 capsule (20 mg total) by mouth daily. 30 capsule 11   ondansetron (ZOFRAN-ODT) 4 MG disintegrating tablet TAKE 1 TABLET BY MOUTH 2 TIMES DAILY AS NEEDED FOR NAUSEA AND VOMITING 30 tablet 2   pregabalin (LYRICA) 75 MG capsule Take 1 capsule (75 mg total) by mouth 2 (two) times daily. 60 capsule 3   prochlorperazine (COMPAZINE) 10 MG tablet Take 1 tablet (10 mg total) by  mouth every 6 (six) hours as needed for nausea or vomiting. 30 tablet 5   PROMETHEGAN 25 MG suppository place 1 SUPPOSITORY rectally EVERY 8 HOURS AS NEEDED FOR NAUSEA AND VOMITING 30 suppository 1   RESTASIS 0.05 % ophthalmic emulsion Place 1 drop into both eyes 2 (two) times daily as needed (dry eyes).     rOPINIRole (REQUIP) 0.25 MG tablet Take 0.35m TID x7 days, then take 0.518mTID x7 days, then take 0.7531mID x7 days, then take 1mg7mD 90 tablet 1   spironolactone (ALDACTONE) 25 MG tablet Take 1 tablet (25 mg total) by mouth daily. 90 tablet 3   sucralfate (CARAFATE) 1 GM/10ML suspension Take 10 mLs (1 g total) by mouth 4 (four) times daily -  with meals and at bedtime. 420 mL 0   vitamin B-12 (CYANOCOBALAMIN) 1000 MCG tablet Take 1,000 mcg by mouth daily.     zolpidem (AMBIEN) 5 MG tablet Take 1 tablet (5 mg total) by mouth at bedtime as needed for sleep. 20 tablet 0   No current facility-administered medications for this visit.    PHYSICAL EXAMINATION: ECOG PERFORMANCE STATUS: 1 - Symptomatic but completely ambulatory  Vitals:   06/22/21 1042  BP: (!) 145/69  Pulse: 75  Resp: 18  Temp: 98.5 F (36.9 C)  SpO2: 99%   Filed Weights   06/22/21 1042  Weight: 214 lb 11.2 oz (97.4 kg)    GENERAL:alert, no distress and comfortable SKIN: no rash  EYES: sclera clear LUNGS:  normal breathing effort NEURO: alert & oriented x 3 with fluent speech Breast exam: Focused exam of the left mastectomy site shows diffuse hyperpigmentation to the left chest wall and axilla.  Incisions completely healed.  There is firmness in the medial chest wall, either seroma, significant scar tissue, and/or lymphedema.  No palpable mass or nodularity along the left chest wall or axilla that I could appreciate. PAC without erythema   LABORATORY DATA:  I have reviewed the data as listed CBC Latest Ref Rng & Units 06/22/2021 05/27/2021 05/22/2021  WBC 4.0 - 10.5 K/uL  5.6 5.7 4.3  Hemoglobin 12.0 - 15.0 g/dL  9.7(L) 10.8(L) 11.1(L)  Hematocrit 36.0 - 46.0 % 29.9(L) 34.0(L) 35.2(L)  Platelets 150 - 400 K/uL 245 411(H) 399     CMP Latest Ref Rng & Units 06/22/2021 05/27/2021 05/27/2021  Glucose 70 - 99 mg/dL 315(H) 236(H) -  BUN 8 - 23 mg/dL 20 30(H) -  Creatinine 0.44 - 1.00 mg/dL 1.14(H) 1.41(H) -  Sodium 135 - 145 mmol/L 141 141 -  Potassium 3.5 - 5.1 mmol/L 3.8 3.8 -  Chloride 98 - 111 mmol/L 107 100 -  CO2 22 - 32 mmol/L 25 29 -  Calcium 8.9 - 10.3 mg/dL 10.3 11.0(H) 10.6(H)  Total Protein 6.5 - 8.1 g/dL 7.2 7.5 -  Total Bilirubin 0.3 - 1.2 mg/dL 0.4 0.4 -  Alkaline Phos 38 - 126 U/L 70 71 -  AST 15 - 41 U/L 13(L) 11(L) -  ALT 0 - 44 U/L 12 15 -      RADIOGRAPHIC STUDIES: I have personally reviewed the radiological images as listed and agreed with the findings in the report. No results found.   ASSESSMENT & PLAN: Destaney Sarkis is a 70 y.o. female with     1. Malignant neoplasm of upper-outer quadrant of left breast, Stage II, c(T2N1M0), ER+/PR+/HER2+, Grade II  -She was diagnosed in 06/2020. She has 12cm calcification and 4.5cm mass in her left breast with 2 abnormal LN. 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  -Her MRI Breast from 07/31/20 shows very extensive disease in her left breast and 3 abnormal LNs in the left axilla. There is indeterminate enhancing 15m mass within the upper inner right breast. 08/11/20 right breast biopsy showed benign papillary lesion. -Her bone scan from 08/03/20 shows uptake in her lumbar spine, but nonspecific. She has known lumbar back pain from arthritis. Her CT CAP from 08/07/20 showed known breast masses and left axillary LN and no distant metastasis. -She started neoadjuvant chemo THP q3weeks on 08/05/20 which was dose reduced with C1. Tolerated poorly with body aches, n/v/d and switched to kadcyla +/- perjeta. She was hospitalized for CHF exacerbation  before she could start it.  Due to poor tolerance, Kadcyla DC'd after cycle 2 -She began exemestane 12/10/2020, tolerating well with hot and cold flashes -S/p left mastectomy on 12/30/20 under Dr. TGeorgette Dover Pathology showed: focal residual invasive ductal carcinoma s/p neoadjuvant treatment; residual DCIS; margins of resection not involved; all 5 lymph nodes negative (0/5). She had a minimal residual cancer, excellent response to neoadjuvant chemotherapy. -she completed adjuvant RT per Dr. MLisbeth Renshaw7/20/22 -Continue daily exemestane and every 3 week Herceptin to complete 1 year -herceptin/perjeta and exemestane on hold since 04/2021 for weakness, neuro and syncope work up -resume 06/22/21 exemestane and herceptin only   2. Fatigue, balance issue, weakness, syncope -cardiology work up showed euvolemia, normal EKG. Echo is pending (10/20). Felt to be related to vasovagal secondary to dehydration/poor po intake -neurology work up by Dr. VMickeal Skinnerrevealed Parkindon's disease. She started requip and lyrica  -improved, no recent syncopal episode   3. CHF, DM with neuropathy, HTN, arthritis -Follow-up cardiology, PCP -Monitoring fluid status -Echo 07/31/2020 and 03/05/2021 was adequate to continue HER2 therapy. Next echo 07/08/21   Disposition:  Ms. SKaselappears stable. She completed last cycle herceptin/perjeta on 05/06/21. Treatment as been on hold for neuro symptoms and syncopal work up. Cardiology feels this is related to dehydration and poor  po intake. She has subsequently been diagnosed with Parkinson's by Dr. Mickeal Skinner and started on medication.   Given her improvement, we will resume exemestane and herceptin today. Given her previous symptoms, will not reload. Labs adequate to proceed. Next echo 07/08/21. She will return for f/up and next cycle in 3 weeks.   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    Orders Placed This Encounter  Procedures   Ambulatory referral to Physical  Therapy    Referral Priority:   Routine    Referral Type:   Physical Medicine    Referral Reason:   Specialty Services Required    Requested Specialty:   Physical Therapy    Number of Visits Requested:   1    All questions were answered. The patient knows to call the clinic with any problems, questions or concerns. No barriers to learning were detected.     Alla Feeling, NP 06/22/21

## 2021-06-22 ENCOUNTER — Encounter: Payer: Self-pay | Admitting: Nurse Practitioner

## 2021-06-22 ENCOUNTER — Encounter: Payer: Self-pay | Admitting: *Deleted

## 2021-06-22 ENCOUNTER — Inpatient Hospital Stay: Payer: Medicare HMO | Attending: Nurse Practitioner

## 2021-06-22 ENCOUNTER — Other Ambulatory Visit: Payer: Self-pay

## 2021-06-22 ENCOUNTER — Inpatient Hospital Stay: Payer: Medicare HMO

## 2021-06-22 ENCOUNTER — Inpatient Hospital Stay (HOSPITAL_BASED_OUTPATIENT_CLINIC_OR_DEPARTMENT_OTHER): Payer: Medicare HMO | Admitting: Nurse Practitioner

## 2021-06-22 VITALS — BP 145/69 | HR 75 | Temp 98.5°F | Resp 18 | Ht 63.6 in | Wt 214.7 lb

## 2021-06-22 DIAGNOSIS — Z95828 Presence of other vascular implants and grafts: Secondary | ICD-10-CM

## 2021-06-22 DIAGNOSIS — C50412 Malignant neoplasm of upper-outer quadrant of left female breast: Secondary | ICD-10-CM

## 2021-06-22 DIAGNOSIS — I890 Lymphedema, not elsewhere classified: Secondary | ICD-10-CM | POA: Diagnosis not present

## 2021-06-22 DIAGNOSIS — Z5112 Encounter for antineoplastic immunotherapy: Secondary | ICD-10-CM | POA: Insufficient documentation

## 2021-06-22 DIAGNOSIS — Z170 Estrogen receptor positive status [ER+]: Secondary | ICD-10-CM | POA: Insufficient documentation

## 2021-06-22 LAB — CMP (CANCER CENTER ONLY)
ALT: 12 U/L (ref 0–44)
AST: 13 U/L — ABNORMAL LOW (ref 15–41)
Albumin: 3.7 g/dL (ref 3.5–5.0)
Alkaline Phosphatase: 70 U/L (ref 38–126)
Anion gap: 9 (ref 5–15)
BUN: 20 mg/dL (ref 8–23)
CO2: 25 mmol/L (ref 22–32)
Calcium: 10.3 mg/dL (ref 8.9–10.3)
Chloride: 107 mmol/L (ref 98–111)
Creatinine: 1.14 mg/dL — ABNORMAL HIGH (ref 0.44–1.00)
GFR, Estimated: 53 mL/min — ABNORMAL LOW (ref >60)
Glucose, Bld: 315 mg/dL — ABNORMAL HIGH (ref 70–99)
Potassium: 3.8 mmol/L (ref 3.5–5.1)
Sodium: 141 mmol/L (ref 135–145)
Total Bilirubin: 0.4 mg/dL (ref 0.3–1.2)
Total Protein: 7.2 g/dL (ref 6.5–8.1)

## 2021-06-22 LAB — CBC WITH DIFFERENTIAL (CANCER CENTER ONLY)
Abs Immature Granulocytes: 0.02 10*3/uL (ref 0.00–0.07)
Basophils Absolute: 0 10*3/uL (ref 0.0–0.1)
Basophils Relative: 1 %
Eosinophils Absolute: 0.2 10*3/uL (ref 0.0–0.5)
Eosinophils Relative: 4 %
HCT: 29.9 % — ABNORMAL LOW (ref 36.0–46.0)
Hemoglobin: 9.7 g/dL — ABNORMAL LOW (ref 12.0–15.0)
Immature Granulocytes: 0 %
Lymphocytes Relative: 21 %
Lymphs Abs: 1.2 10*3/uL (ref 0.7–4.0)
MCH: 28.5 pg (ref 26.0–34.0)
MCHC: 32.4 g/dL (ref 30.0–36.0)
MCV: 87.9 fL (ref 80.0–100.0)
Monocytes Absolute: 0.4 10*3/uL (ref 0.1–1.0)
Monocytes Relative: 7 %
Neutro Abs: 3.8 10*3/uL (ref 1.7–7.7)
Neutrophils Relative %: 67 %
Platelet Count: 245 10*3/uL (ref 150–400)
RBC: 3.4 MIL/uL — ABNORMAL LOW (ref 3.87–5.11)
RDW: 13.6 % (ref 11.5–15.5)
WBC Count: 5.6 10*3/uL (ref 4.0–10.5)
nRBC: 0 % (ref 0.0–0.2)

## 2021-06-22 MED ORDER — SODIUM CHLORIDE 0.9 % IV SOLN: Freq: Once | INTRAVENOUS | Status: AC

## 2021-06-22 MED ORDER — DIPHENHYDRAMINE HCL 25 MG PO CAPS
50.0000 mg | ORAL_CAPSULE | Freq: Once | ORAL | Status: AC
  Administered 2021-06-22: 50 mg via ORAL
  Filled 2021-06-22: qty 2

## 2021-06-22 MED ORDER — EXEMESTANE 25 MG PO TABS: 25.0000 mg | ORAL_TABLET | Freq: Every day | ORAL | 1 refills | Status: DC

## 2021-06-22 MED ORDER — SODIUM CHLORIDE 0.9% FLUSH
10.0000 mL | INTRAVENOUS | Status: DC | PRN
  Administered 2021-06-22: 10 mL

## 2021-06-22 MED ORDER — SODIUM CHLORIDE 0.9% FLUSH
10.0000 mL | INTRAVENOUS | Status: AC | PRN
  Administered 2021-06-22: 10 mL

## 2021-06-22 MED ORDER — ACETAMINOPHEN 325 MG PO TABS
650.0000 mg | ORAL_TABLET | Freq: Once | ORAL | Status: AC
  Administered 2021-06-22: 650 mg via ORAL
  Filled 2021-06-22: qty 2

## 2021-06-22 MED ORDER — TRASTUZUMAB-ANNS CHEMO 150 MG IV SOLR
6.0000 mg/kg | Freq: Once | INTRAVENOUS | Status: AC
  Administered 2021-06-22: 567 mg via INTRAVENOUS
  Filled 2021-06-22: qty 27

## 2021-06-22 MED ORDER — HEPARIN SOD (PORK) LOCK FLUSH 100 UNIT/ML IV SOLN
500.0000 [IU] | Freq: Once | INTRAVENOUS | Status: AC | PRN
  Administered 2021-06-22: 500 [IU]

## 2021-06-22 NOTE — Patient Instructions (Signed)
[place] ONCOL[facility]  Discharge Instructions: Thank you for choosing [facility] to provide your oncology and hematology care.   If you have a lab appointment with the [facility], please go directly to the [facility] and check in at the registration area.   Wear comfortable clothing and clothing appropriate for easy access to any Portacath or PICC line.   We strive to give you quality time with your provider. You may need to reschedule your appointment if you arrive late (15 or more minutes).  Arriving late affects you and other patients whose appointments are after yours.  Also, if you miss three or more appointments without notifying the office, you may be dismissed from the clinic at the provider's discretion.      For prescription refill requests, have your pharmacy contact our office and allow 72 hours for refills to be completed.    Today you received the following chemotherapy and/or immunotherapy agents: Trastuzumab (Kanjinti)    To help prevent nausea and vomiting after your treatment, we encourage you to take your nausea medication as directed.  BELOW ARE SYMPTOMS THAT SHOULD BE REPORTED IMMEDIATELY: *FEVER GREATER THAN 100.4 F (38 C) OR HIGHER *CHILLS OR SWEATING *NAUSEA AND VOMITING THAT IS NOT CONTROLLED WITH YOUR NAUSEA MEDICATION *UNUSUAL SHORTNESS OF BREATH *UNUSUAL BRUISING OR BLEEDING *URINARY PROBLEMS (pain or burning when urinating, or frequent urination) *BOWEL PROBLEMS (unusual diarrhea, constipation, pain near the anus) TENDERNESS IN MOUTH AND THROAT WITH OR WITHOUT PRESENCE OF ULCERS (sore throat, sores in mouth, or a toothache) UNUSUAL RASH, SWELLING OR PAIN  UNUSUAL VAGINAL DISCHARGE OR ITCHING   Items with * indicate a potential emergency and should be followed up as soon as possible or go to the Emergency Department if any problems should occur.  Please show the CHEMOTHERAPY ALERT CARD or IMMUNOTHERAPY ALERT CARD at  check-in to the Emergency Department and triage nurse.  Should you have questions after your visit or need to cancel or reschedule your appointment, please contact [facility]  Dept: [phone]  and follow the prompts.  Office hours are 8:00 a.m. to 4:30 p.m. Monday - Friday. Please note that voicemails left after 4:00 p.m. may not be returned until the following business day.  We are closed weekends and major holidays. You have access to a nurse at all times for urgent questions. Please call the main number to the clinic Dept: [phone] and follow the prompts.   For any non-urgent questions, you may also contact your provider using MyChart. We now offer e-Visits for anyone 23 and older to request care online for non-urgent symptoms. For details visit [email or web address].   Also download the MyChart app! Go to the app store, search "MyChart", open the app, select , and log in with your MyChart username and password.  Due to Covid, a mask is required upon entering the hospital/clinic. If you do not have a mask, one will be given to you upon arrival. For doctor visits, patients may have 1 support person aged 96 or older with them. For treatment visits, patients cannot have anyone with them due to current Covid guidelines and our immunocompromised population.

## 2021-06-22 NOTE — Progress Notes (Addendum)
Continue Kanjinti 6 mg/kg. No reload.   Raul Del Grayridge, [place], BCPS, BCOP 06/22/2021 12:15 PM

## 2021-06-23 ENCOUNTER — Telehealth: Payer: Self-pay | Admitting: Hematology

## 2021-06-23 NOTE — Telephone Encounter (Signed)
Left message with follow-up appointments per 10/4 los. 

## 2021-06-25 ENCOUNTER — Ambulatory Visit: Payer: Medicare HMO | Attending: Nurse Practitioner

## 2021-06-28 ENCOUNTER — Encounter: Payer: Self-pay | Admitting: *Deleted

## 2021-06-29 ENCOUNTER — Encounter: Payer: Self-pay | Admitting: Hematology

## 2021-07-06 ENCOUNTER — Encounter: Payer: Self-pay | Admitting: Hematology

## 2021-07-08 ENCOUNTER — Other Ambulatory Visit: Payer: Self-pay

## 2021-07-08 ENCOUNTER — Ambulatory Visit (HOSPITAL_COMMUNITY): Payer: Medicare HMO | Attending: Internal Medicine

## 2021-07-08 DIAGNOSIS — I5032 Chronic diastolic (congestive) heart failure: Secondary | ICD-10-CM

## 2021-07-08 DIAGNOSIS — Z09 Encounter for follow-up examination after completed treatment for conditions other than malignant neoplasm: Secondary | ICD-10-CM | POA: Diagnosis present

## 2021-07-08 LAB — ECHOCARDIOGRAM COMPLETE
Area-P 1/2: 3.77 cm2
S' Lateral: 3 cm

## 2021-07-13 ENCOUNTER — Inpatient Hospital Stay: Payer: Medicare HMO | Admitting: Internal Medicine

## 2021-07-13 ENCOUNTER — Other Ambulatory Visit: Payer: Self-pay

## 2021-07-13 ENCOUNTER — Encounter: Payer: Self-pay | Admitting: Hematology

## 2021-07-14 ENCOUNTER — Other Ambulatory Visit: Payer: Medicare HMO

## 2021-07-14 ENCOUNTER — Ambulatory Visit: Payer: Medicare HMO | Admitting: Hematology

## 2021-07-14 ENCOUNTER — Other Ambulatory Visit: Payer: Self-pay

## 2021-07-14 ENCOUNTER — Ambulatory Visit: Payer: Medicare HMO

## 2021-07-14 ENCOUNTER — Encounter (INDEPENDENT_AMBULATORY_CARE_PROVIDER_SITE_OTHER): Payer: Self-pay | Admitting: Ophthalmology

## 2021-07-14 ENCOUNTER — Ambulatory Visit (INDEPENDENT_AMBULATORY_CARE_PROVIDER_SITE_OTHER): Payer: Medicare HMO | Admitting: Ophthalmology

## 2021-07-14 DIAGNOSIS — Z961 Presence of intraocular lens: Secondary | ICD-10-CM | POA: Diagnosis not present

## 2021-07-14 DIAGNOSIS — E113513 Type 2 diabetes mellitus with proliferative diabetic retinopathy with macular edema, bilateral: Secondary | ICD-10-CM

## 2021-07-14 DIAGNOSIS — H3581 Retinal edema: Secondary | ICD-10-CM

## 2021-07-14 DIAGNOSIS — H35033 Hypertensive retinopathy, bilateral: Secondary | ICD-10-CM

## 2021-07-14 DIAGNOSIS — I10 Essential (primary) hypertension: Secondary | ICD-10-CM | POA: Diagnosis not present

## 2021-07-14 DIAGNOSIS — C50412 Malignant neoplasm of upper-outer quadrant of left female breast: Secondary | ICD-10-CM

## 2021-07-14 NOTE — Progress Notes (Signed)
[facility] Note  07/14/2021     CHIEF COMPLAINT Patient presents for Retina Follow Up   HISTORY OF PRESENT ILLNESS: Cheryl Blankenship is a 70 y.o. female who presents to the clinic today for:   HPI     Retina Follow Up   Patient presents with  Diabetic Retinopathy.  In both eyes.  This started 1 year ago.  I, the attending physician,  performed the HPI with the patient and updated documentation appropriately.        Comments   Patient here for 1 year retina follow up (past due) for PDR OU. Patient states vision not good. OS has something going on. Can see the blood stripes. OD can't see like something covering the eye. OS blurry. Has eye pain. Dull pain. At night when sun goes down pain gets worse. Using gtts BID OU. Had breast cancer surgery mastectomy. Doing treatment IV kind.       Last edited by Bernarda Caffey, MD on 07/16/2021 11:33 PM.    pt states she is delayed to follow up from October 2021 due to developing breast cancer and undergoing chemo and radiation, pt states last Saturday she noticed that her right eye vision had decreased, she states she felt like she had something in her eye, but it kept coming back  Referring physician: Shirleen Schirmer, Phineas Inches, NP [place],  [place] [id number]  HISTORICAL INFORMATION:   Selected notes from the [facility] Referred by Shirleen Schirmer, PA-C    CURRENT MEDICATIONS: Current Outpatient Medications (Ophthalmic Drugs)  Medication Sig   RESTASIS 0.05 % ophthalmic emulsion Place 1 drop into both eyes 2 (two) times daily as needed (dry eyes).   No current facility-administered medications for this visit. (Ophthalmic Drugs)   Current Outpatient Medications (Other)  Medication Sig   Accu-Chek FastClix Lancets MISC 1 each by Other route in the morning, at noon, and at bedtime.   albuterol (VENTOLIN HFA) 108 (90 Base) MCG/ACT inhaler Inhale 1-2 puffs into the  lungs every 4 (four) hours as needed for wheezing or shortness of breath.   ALPRAZolam (XANAX) 0.25 MG tablet Take 1-2 tablets (0.25-0.5 mg total) by mouth 3 (three) times daily as needed for anxiety or sleep.   amLODipine (NORVASC) 10 MG tablet Take 1 tablet (10 mg total) by mouth daily.   aspirin 81 MG chewable tablet Chew 81 mg by mouth daily.    bisacodyl (DULCOLAX) 5 MG EC tablet Take 1 tablet (5 mg total) by mouth daily as needed for moderate constipation.   carvedilol (COREG) 25 MG tablet Take 1 tablet (25 mg total) by mouth 2 (two) times daily with a meal.   Cholecalciferol 25 MCG (1000 UT) capsule Take 1,000 Units by mouth daily.   diphenoxylate-atropine (LOMOTIL) 2.5-0.025 MG tablet TAKE 1 OR 2 TABLETS BY MOUTH 4 TIMES DAILY AS NEEDED FOR DIARRHEA OR loose stools   DULoxetine (CYMBALTA) 60 MG capsule Take 60 mg by mouth daily.   exemestane (AROMASIN) 25 MG tablet Take 1 tablet (25 mg total) by mouth daily after breakfast.   folic acid (FOLVITE) 1 MG tablet Take 1 mg by mouth daily.   furosemide (LASIX) 20 MG tablet Take 1 tablet (20 mg total) by mouth daily as needed (for swelling or weight gain).   glipiZIDE (GLUCOTROL) 10 MG tablet Take 10 mg by mouth daily before breakfast.   insulin aspart (NOVOLOG) 100 UNIT/ML injection Inject 2-10 Units into  the skin 3 (three) times daily before meals. Per sliding scale:  150-199 = 2 units, 200-249 = 4 units, 250-299 = 6 units, 300-349 = 8 units, greater than350 = 10 units.   insulin glargine (LANTUS SOLOSTAR) 100 UNIT/ML Solostar Pen Inject 35 Units into the skin 2 (two) times daily. (Patient taking differently: Inject 70 Units into the skin 2 (two) times daily. Pt takes 70 units in the morning and 70 units at night.)   lidocaine-prilocaine (EMLA) cream Apply 1 application topically as needed (pain). (Patient taking differently: Apply 1 application topically daily.)   losartan (COZAAR) 100 MG tablet Take 1 tablet (100 mg total) by mouth daily.    metFORMIN (GLUCOPHAGE) 500 MG tablet Take 500 mg by mouth 2 (two) times daily with a meal.    methocarbamol (ROBAXIN) 500 MG tablet Take 500 mg by mouth every 8 (eight) hours as needed for muscle spasms.   Omega-3 Fatty Acids (FISH OIL) 1000 MG CAPS Take 1,000 mg by mouth daily.   omeprazole (PRILOSEC) 20 MG capsule Take 1 capsule (20 mg total) by mouth daily.   ondansetron (ZOFRAN-ODT) 4 MG disintegrating tablet TAKE 1 TABLET BY MOUTH 2 TIMES DAILY AS NEEDED FOR NAUSEA AND VOMITING   pregabalin (LYRICA) 75 MG capsule Take 1 capsule (75 mg total) by mouth 2 (two) times daily.   prochlorperazine (COMPAZINE) 10 MG tablet Take 1 tablet (10 mg total) by mouth every 6 (six) hours as needed for nausea or vomiting.   PROMETHEGAN 25 MG suppository place 1 SUPPOSITORY rectally EVERY 8 HOURS AS NEEDED FOR NAUSEA AND VOMITING   rOPINIRole (REQUIP) 0.25 MG tablet Take 0.49m TID x7 days, then take 0.570mTID x7 days, then take 0.7580mID x7 days, then take 1mg11mD   spironolactone (ALDACTONE) 25 MG tablet Take 1 tablet (25 mg total) by mouth daily.   sucralfate (CARAFATE) 1 GM/10ML suspension Take 10 mLs (1 g total) by mouth 4 (four) times daily -  with meals and at bedtime.   vitamin B-12 (CYANOCOBALAMIN) 1000 MCG tablet Take 1,000 mcg by mouth daily.   zolpidem (AMBIEN) 5 MG tablet Take 1 tablet (5 mg total) by mouth at bedtime as needed for sleep.   No current facility-administered medications for this visit. (Other)   REVIEW OF SYSTEMS: ROS   Positive for: Gastrointestinal, Endocrine, Cardiovascular, Eyes, Psychiatric Negative for: Constitutional, Neurological, Skin, Genitourinary, Musculoskeletal, HENT, Respiratory, Allergic/Imm, Heme/Lymph Last edited by ClarTheodore DemarkA on 07/14/2021  2:14 PM.     ALLERGIES Allergies  Allergen Reactions   Coconut (Cocos Nucifera) Allergy Skin Test     Hives and swells mouth   Hydrochlorothiazide Swelling    tongue   Lisinopril Swelling    Angioedema.  Tolerates Losartan.   Coconut Oil Hives    PAST MEDICAL HISTORY Past Medical History:  Diagnosis Date   Anxiety    Breast cancer (HCC)[place] CHF (congestive heart failure) (HCC)[place] Depression    Diabetes mellitus without complication (HCC)[place] type 2   GERD (gastroesophageal reflux disease)    History of kidney stones    Hypertension    PONV (postoperative nausea and vomiting)    [facility] Past Surgical History:  Procedure Laterality Date   APPENDECTOMY     CHOLECYSTECTOMY     COLONOSCOPY     ESOPHAGOGASTRODUODENOSCOPY (EGD) WITH PROPOFOL N/A 11/01/2020   Procedure: ESOPHAGOGASTRODUODENOSCOPY (EGD) WITH PROPOFOL;  Surgeon: OutlArta Silence;  Location: [facility];  Service: Endoscopy;  Laterality: N/A;   EYE SURGERY     cataracts   MASTECTOMY WITH AXILLARY LYMPH NODE DISSECTION Left 12/30/2020   Procedure: LEFT MASTECTOMY WITH TARGETED AXILLARY LYMPH NODE DISSECTION;  Surgeon: [name], MD;  Location: [facility] OR;  Service: General;  Laterality: Left;   MULTIPLE TOOTH EXTRACTIONS     PORTACATH PLACEMENT N/A 08/04/2020   Procedure: INSERTION PORT-A-CATH WITH ULTRASOUND GUIDANCE;  Surgeon: [name], MD;  Location: [facility];  Service: General;  Laterality: N/A;   STOMACH SURGERY     FAMILY HISTORY Family History  Problem Relation Age of Onset   Heart attack Sister    Diabetes Sister    Heart attack Mother    Ovarian cancer Mother 41   Heart attack Brother    Cancer Niece        bone cancer    Cancer Cousin        unknown type cancer    Cancer Cousin        metastatic cancer to bone    SOCIAL HISTORY Social History   Tobacco Use   Smoking status: Former    Packs/day: 3.00    Years: 30.00    Pack years: 90.00    Types: Cigarettes   Smokeless tobacco: Never  Vaping Use   Vaping Use: Never used  Substance Use Topics   Alcohol use: Not Currently    Comment: 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   Drug use: Never       OPHTHALMIC EXAM: Base  Eye Exam     Visual Acuity (Snellen - Linear)       Right Left   Dist [place] 20/250 -1 20/70   Dist ph [place] NI NI         Tonometry (Tonopen, 2:08 PM)       Right Left   Pressure 19 22         Pupils       Dark Light Shape React APD   Right 3 2 Round Brisk None   Left 3 2 Round Brisk None         Visual Fields (Counting fingers)       Left Right    Full Full         Extraocular Movement       Right Left    Full Full         Neuro/Psych     Oriented x3: Yes   Mood/Affect: Normal         Dilation     Both eyes: 1.0% Mydriacyl, 2.5% Phenylephrine @ 2:08 PM           Slit Lamp and Fundus Exam     Slit Lamp Exam       Right Left   Lids/Lashes Dermatochalasis - upper lid Dermatochalasis - upper lid   Conjunctiva/Sclera Trace Melanosis Trace Melanosis, nasal and temporal pinguecula   Cornea arcus, 2-3+ Punctate epithelial erosions, well healed temporal cataract wounds arcus, 3+ Punctate epithelial erosions, well healed temporal cataract wounds   Anterior Chamber Deep and quiet Deep and quiet   Iris Round and dilated, No NVI Round and dilated, No NVI   Lens PC IOL in good position PC IOL in good position   Vitreous Vitreous syneresis, blood stained vitreous condensations settling inferiorly Vitreous syneresis         Fundus Exam       Right Left   Disc mild Pallor, fine NVD mild Pallor,  Sharp rim, mild tilt, ?fine NVD   C/D Ratio 0.3 0.4   Macula Flat, Blunted foveal reflex, central edema, scattered MA/DBH, focal exudates Flat, Blunted foveal reflex, ERM, scattered Microaneurysms / IRH greatest temporally, scattered Cystic changes greatest SN, focal blot heme SN   Vessels attenuated, Tortuous, early flat NV nasally attenuated, Tortuous, early flat NV nasal to disc   Periphery Attached, 360 DBH    Attached, scatterd MA/DBH greatest nasally           Refraction     Manifest Refraction       Sphere Cylinder Axis Dist VA   Right -0.50  +1.00 175 20/40-2   Left -1.00 +0.50 010 20/60+2           IMAGING AND PROCEDURES  Imaging and Procedures for 07/14/2021  OCT, Retina - OU - Both Eyes       Right Eye Quality was good. Central Foveal Thickness: 237. Progression has worsened. Findings include normal foveal contour, intraretinal fluid, intraretinal hyper-reflective material, no SRF (Interval increase in IRF greatest temporal macula and fovea, +vitreous opacities).   Left Eye Quality was good. Central Foveal Thickness: 220. Progression has been stable. Findings include normal foveal contour, intraretinal fluid, no SRF (non-central cystic changes greatest SN mac).   Notes *Images captured and stored on drive  Diagnosis / Impression:  +DME OU (noncentral, OD>OS) OD: Interval increase in IRF greatest temporal macula and fovea, +vitreous opacities OS: non-central cystic changes greatest SN mac  Clinical management:  See below  Abbreviations: NFP - Normal foveal profile. CME - cystoid macular edema. PED - pigment epithelial detachment. IRF - intraretinal fluid. SRF - subretinal fluid. EZ - ellipsoid zone. ERM - epiretinal membrane. ORA - outer retinal atrophy. ORT - outer retinal tubulation. SRHM - subretinal hyper-reflective material. IRHM - intraretinal hyper-reflective material      Intravitreal Injection, Pharmacologic Agent - OD - Right Eye       Time Out 07/14/2021. 3:19 PM. Confirmed correct patient, procedure, site, and patient consented.   Anesthesia Topical anesthesia was used. Anesthetic medications included Lidocaine 2%, Proparacaine 0.5%.   Procedure Preparation included 5% betadine to ocular surface, eyelid speculum. A supplied (32g) needle was used.   Injection: 1.25 mg Bevacizumab 1.19m/0.05ml   Route: Intravitreal, Site: Right Eye   NDC:: 97948-016-55 Lot: [id number] , Expiration date: 09/01/2021, Waste: 0 mL   Post-op Post injection exam found visual acuity of at least counting fingers.  The patient tolerated the procedure well. There were no complications. The patient received written and verbal post procedure care education. Post injection medications were not given.      Intravitreal Injection, Pharmacologic Agent - OS - Left Eye       Time Out 07/14/2021. 3:19 PM. Confirmed correct patient, procedure, site, and patient consented.   Anesthesia Topical anesthesia was used. Anesthetic medications included Lidocaine 2%, Proparacaine 0.5%.   Procedure Preparation included 5% betadine to ocular surface, eyelid speculum. A (32g) needle was used.   Injection: 1.25 mg Bevacizumab 1.23m0.05ml   Route: Intravitreal, Site: Left Eye   NDC: [id number]: : [id number] date: 09/07/2021, Waste: 0.05 mL   Post-op Post injection exam found visual acuity of at least counting fingers. The patient tolerated the procedure well. There were no complications. The patient received written and verbal post procedure care education. Post injection medications were not given.            ASSESSMENT/PLAN:   ICD-10-CM   1. Proliferative diabetic retinopathy of both eyes with  macular edema associated with type 2 diabetes mellitus (HCC)  Z61.0960 Intravitreal Injection, Pharmacologic Agent - OD - Right Eye    Intravitreal Injection, Pharmacologic Agent - OS - Left Eye    Bevacizumab (AVASTIN) SOLN 1.25 mg    Bevacizumab (AVASTIN) SOLN 1.25 mg    2. Retinal edema  H35.81 OCT, Retina - OU - Both Eyes    3. Essential hypertension  I10     4. Hypertensive retinopathy of both eyes  H35.033     5. Pseudophakia, both eyes  Z96.1      1,2. Proliferative diabetic retinopathy w/ DME, OU (OS > OD)  - HbA1c 10.1 % on 04.13.22, 11.5% on 7.15.21 - s/p IVA OD #1 (09.29.21) - s/p IVA OS #1 (10.08.21) **lost ot f/u from Oct 2021 to Oct 2022 due to breast cancer - exam shows scattered IRH/DBH OU; - FA (09.27.21) shows late-leaking MA, vascular nonperfusion, +NV OU -- will need PRP OU  (OS first) - OCT OD: Interval increase in IRF greatest temporal macula and fovea, +vitreous opacities; OS: non-central cystic changes greatest SN mac - recommend IVA OU #2 today, 10.26.22 - pt wishes to proceed  - RBA of procedure discussed, questions answered - IVA informed consent obtained and signed 9.29.21 (OD) - IVA informed consent obtained and signed 10.08.21 (OS) - see procedure note - f/u 4 weeks, DFE, OCT, FA (transit OD)  3,4. Hypertensive retinopathy OU - discussed importance of tight BP control - monitor  5. Pseudophakia OU  - s/p CE/IOL (~2019, [place])  - IOL in good position, doing well  - monitor  Ophthalmic Meds Ordered this visit:  Meds ordered this encounter  Medications   Bevacizumab (AVASTIN) SOLN 1.25 mg   Bevacizumab (AVASTIN) SOLN 1.25 mg     Return in about 4 weeks (around 08/11/2021).  There are no Patient Instructions on file for this visit.   Explained the diagnoses, plan, and follow up with the patient and they expressed understanding.  Patient expressed understanding of the importance of proper follow up care.   This document serves as a record of services personally performed by Gardiner Sleeper, MD, PhD. It was created on their behalf by San Jetty. Owens Shark, OA an ophthalmic technician. The creation of this record is the provider's dictation and/or activities during the visit.    Electronically signed by: San Jetty. Owens Shark, [place] 10.26.2022 12:10 AM  Gardiner Sleeper, M.D., Ph.D. Diseases & Surgery of the Retina and Vitreous [facility]  I have reviewed the above documentation for accuracy and completeness, and I agree with the above. Gardiner Sleeper, M.D., Ph.D. 07/17/21 12:10 AM   Abbreviations: M myopia (nearsighted); A astigmatism; H hyperopia (farsighted); P presbyopia; Mrx spectacle prescription;  CTL contact lenses; OD right eye; OS left eye; OU both eyes  XT exotropia; ET esotropia; PEK punctate epithelial keratitis; PEE  punctate epithelial erosions; DES dry eye syndrome; MGD meibomian gland dysfunction; ATs artificial tears; PFAT's preservative free artificial tears; [place] nuclear sclerotic cataract; PSC posterior subcapsular cataract; ERM epi-retinal membrane; PVD posterior vitreous detachment; RD retinal detachment; DM diabetes mellitus; DR diabetic retinopathy; NPDR non-proliferative diabetic retinopathy; PDR proliferative diabetic retinopathy; CSME clinically significant macular edema; DME diabetic macular edema; dbh dot blot hemorrhages; CWS cotton wool spot; POAG primary open angle glaucoma; C/D cup-to-disc ratio; HVF humphrey visual field; GVF goldmann visual field; OCT optical coherence tomography; IOP intraocular pressure; BRVO Branch retinal vein occlusion; CRVO central retinal vein occlusion; CRAO central retinal artery  occlusion; BRAO branch retinal artery occlusion; RT retinal tear; SB scleral buckle; PPV pars plana vitrectomy; VH Vitreous hemorrhage; PRP panretinal laser photocoagulation; IVK intravitreal kenalog; VMT vitreomacular traction; MH Macular hole;  NVD neovascularization of the disc; NVE neovascularization elsewhere; AREDS age related eye disease study; ARMD age related macular degeneration; POAG primary open angle glaucoma; EBMD epithelial/anterior basement membrane dystrophy; ACIOL anterior chamber intraocular lens; IOL intraocular lens; PCIOL posterior chamber intraocular lens; Phaco/IOL phacoemulsification with intraocular lens placement; [place] photorefractive keratectomy; LASIK laser assisted in situ keratomileusis; HTN hypertension; DM diabetes mellitus; COPD chronic obstructive pulmonary disease

## 2021-07-16 ENCOUNTER — Encounter (INDEPENDENT_AMBULATORY_CARE_PROVIDER_SITE_OTHER): Payer: Self-pay | Admitting: Ophthalmology

## 2021-07-17 MED ORDER — BEVACIZUMAB CHEMO INJECTION 1.25MG/0.05ML SYRINGE FOR KALEIDOSCOPE
1.2500 mg | INTRAVITREAL | Status: AC | PRN
  Administered 2021-07-14: 1.25 mg via INTRAVITREAL

## 2021-07-19 ENCOUNTER — Inpatient Hospital Stay: Payer: Medicare HMO

## 2021-07-19 ENCOUNTER — Other Ambulatory Visit: Payer: Self-pay

## 2021-07-19 ENCOUNTER — Encounter: Payer: Self-pay | Admitting: Hematology

## 2021-07-19 ENCOUNTER — Inpatient Hospital Stay (HOSPITAL_BASED_OUTPATIENT_CLINIC_OR_DEPARTMENT_OTHER): Payer: Medicare HMO | Admitting: Hematology

## 2021-07-19 VITALS — BP 117/64 | HR 65 | Temp 98.8°F | Resp 18 | Ht 63.6 in | Wt 214.8 lb

## 2021-07-19 DIAGNOSIS — C50412 Malignant neoplasm of upper-outer quadrant of left female breast: Secondary | ICD-10-CM | POA: Diagnosis not present

## 2021-07-19 DIAGNOSIS — Z170 Estrogen receptor positive status [ER+]: Secondary | ICD-10-CM

## 2021-07-19 DIAGNOSIS — Z1231 Encounter for screening mammogram for malignant neoplasm of breast: Secondary | ICD-10-CM | POA: Diagnosis not present

## 2021-07-19 DIAGNOSIS — Z5112 Encounter for antineoplastic immunotherapy: Secondary | ICD-10-CM | POA: Diagnosis not present

## 2021-07-19 DIAGNOSIS — Z95828 Presence of other vascular implants and grafts: Secondary | ICD-10-CM

## 2021-07-19 LAB — CBC WITH DIFFERENTIAL (CANCER CENTER ONLY)
Abs Immature Granulocytes: 0.01 10*3/uL (ref 0.00–0.07)
Basophils Absolute: 0 10*3/uL (ref 0.0–0.1)
Basophils Relative: 1 %
Eosinophils Absolute: 0.3 10*3/uL (ref 0.0–0.5)
Eosinophils Relative: 6 %
HCT: 33.9 % — ABNORMAL LOW (ref 36.0–46.0)
Hemoglobin: 10.7 g/dL — ABNORMAL LOW (ref 12.0–15.0)
Immature Granulocytes: 0 %
Lymphocytes Relative: 25 %
Lymphs Abs: 1.3 10*3/uL (ref 0.7–4.0)
MCH: 27.7 pg (ref 26.0–34.0)
MCHC: 31.6 g/dL (ref 30.0–36.0)
MCV: 87.8 fL (ref 80.0–100.0)
Monocytes Absolute: 0.4 10*3/uL (ref 0.1–1.0)
Monocytes Relative: 9 %
Neutro Abs: 3 10*3/uL (ref 1.7–7.7)
Neutrophils Relative %: 59 %
Platelet Count: 274 10*3/uL (ref 150–400)
RBC: 3.86 MIL/uL — ABNORMAL LOW (ref 3.87–5.11)
RDW: 13.6 % (ref 11.5–15.5)
WBC Count: 5.1 10*3/uL (ref 4.0–10.5)
nRBC: 0 % (ref 0.0–0.2)

## 2021-07-19 LAB — CMP (CANCER CENTER ONLY)
ALT: 11 U/L (ref 0–44)
AST: 11 U/L — ABNORMAL LOW (ref 15–41)
Albumin: 4 g/dL (ref 3.5–5.0)
Alkaline Phosphatase: 76 U/L (ref 38–126)
Anion gap: 8 (ref 5–15)
BUN: 42 mg/dL — ABNORMAL HIGH (ref 8–23)
CO2: 29 mmol/L (ref 22–32)
Calcium: 10.6 mg/dL — ABNORMAL HIGH (ref 8.9–10.3)
Chloride: 104 mmol/L (ref 98–111)
Creatinine: 1.65 mg/dL — ABNORMAL HIGH (ref 0.44–1.00)
GFR, Estimated: 34 mL/min — ABNORMAL LOW (ref >60)
Glucose, Bld: 226 mg/dL — ABNORMAL HIGH (ref 70–99)
Potassium: 4.3 mmol/L (ref 3.5–5.1)
Sodium: 141 mmol/L (ref 135–145)
Total Bilirubin: 0.5 mg/dL (ref 0.3–1.2)
Total Protein: 7.7 g/dL (ref 6.5–8.1)

## 2021-07-19 MED ORDER — ACETAMINOPHEN 325 MG PO TABS
650.0000 mg | ORAL_TABLET | Freq: Once | ORAL | Status: AC
  Administered 2021-07-19: 650 mg via ORAL
  Filled 2021-07-19: qty 2

## 2021-07-19 MED ORDER — SODIUM CHLORIDE 0.9% FLUSH
10.0000 mL | INTRAVENOUS | Status: DC | PRN
  Administered 2021-07-19: 10 mL

## 2021-07-19 MED ORDER — DIPHENHYDRAMINE HCL 25 MG PO CAPS
50.0000 mg | ORAL_CAPSULE | Freq: Once | ORAL | Status: AC
  Administered 2021-07-19: 50 mg via ORAL
  Filled 2021-07-19: qty 2

## 2021-07-19 MED ORDER — HEPARIN SOD (PORK) LOCK FLUSH 100 UNIT/ML IV SOLN
500.0000 [IU] | Freq: Once | INTRAVENOUS | Status: AC | PRN
  Administered 2021-07-19: 500 [IU]

## 2021-07-19 MED ORDER — SODIUM CHLORIDE 0.9 % IV SOLN: Freq: Once | INTRAVENOUS | Status: AC

## 2021-07-19 MED ORDER — TRASTUZUMAB-ANNS CHEMO 150 MG IV SOLR
6.0000 mg/kg | Freq: Once | INTRAVENOUS | Status: AC
  Administered 2021-07-19: 567 mg via INTRAVENOUS
  Filled 2021-07-19: qty 27

## 2021-07-19 MED ORDER — SODIUM CHLORIDE 0.9% FLUSH
10.0000 mL | INTRAVENOUS | Status: DC | PRN
  Administered 2021-07-19: 10 mL via INTRAVENOUS

## 2021-07-19 NOTE — Progress Notes (Signed)
Cheryl Blankenship   [phone] [phone]   Clinic Follow up Note   Patient Care Team: Cipriano Mile, NP as PCP - General [name], RN as Oncology Nurse Navigator [name], RN as Oncology Nurse Navigator [name], MD as Consulting Physician (General Surgery) [name], MD as Consulting Physician (Hematology) [name], MD as Consulting Physician (Radiation Oncology) [name], [name], MD as Consulting Physician (Cardiology)  Date of Service:  07/19/2021  CHIEF COMPLAINT: f/u of left breast cancer  CURRENT THERAPY:  Exemestane  Kanjinti q3 weeks, perjeta discontinued   ASSESSMENT & PLAN:  Cheryl Blankenship is a 70 y.o. female with   1. Malignant neoplasm of upper-outer quadrant of left breast, Stage II, c(T2N1M0), ER+/PR+/HER2+, Grade II  -She was diagnosed in 06/2020. She has 12cm calcification and 4.5cm mass in her left breast with 2 abnormal LN. 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  -Her MRI Breast from 07/31/20 shows very extensive disease in her left breast and 3 abnormal LNs in the left axilla. There is indeterminate enhancing 16m mass within the upper inner right breast. 08/11/20 right breast biopsy showed benign papillary lesion. -Her bone scan from 08/03/20 shows uptake in her lumbar spine, but nonspecific. She has known lumbar back pain from arthritis. Her CT CAP from 08/07/20 showed known breast masses and left axillary LN and no distant metastasis. -She started neoadjuvant chemo THP q3weeks on 08/05/20 which was dose reduced with C1. Tolerated poorly with body aches, n/v/d. [facility]. Due to poor tolerance, Kadcyla DC'd after cycle 2 -She began exemestane 12/10/2020, tolerating well with hot and cold flashes -S/p left mastectomy on  12/30/20 under Dr. TGeorgette Dover Pathology showed: focal residual invasive ductal carcinoma s/p neoadjuvant treatment; residual DCIS; margins of resection not involved; all 5 lymph nodes negative (0/5). She had a minimal residual cancer, excellent response to neoadjuvant chemotherapy. -she completed adjuvant RT per Dr. MLisbeth Renshaw7/20/22 -herceptin/perjeta and exemestane on hold since 04/2021 for weakness, neuro and syncope work up -resumed exemestane and herceptin only 06/22/21. Will discontinue perjeta. We will reach out to her insurance regarding switching to herceptin injections. -she is due for right mammogram. I ordered for her today.   2. Parkinson's Disease, diagnosed in Sep 2022 -she experienced fatigue, balance issue, weakness, syncope -cardiology work up showed euvolemia, normal EKG. Felt to be related to vasovagal secondary to dehydration/poor po intake -neurology work up by Dr. VMickeal Skinnerrevealed Parkindon's disease. She started requip and lyrica  -improved, no recent syncopal episode   3. CHF, DM with neuropathy, HTN, arthritis -Follow-up cardiology, PCP -Monitoring fluid status -most recent echo 07/08/21 was normal.   PLAN: -I ordered right screening mammogram to be done soon -herceptin every 3 weeks x3  -will ask about injection -lab and f/u in 9 weeks -will check if her insurance will cover Herceptin injections, she would like to remove her port    No problem-specific Assessment & Plan notes found for this encounter.   SUMMARY OF ONCOLOGIC HISTORY: Oncology History Overview Note  Cancer Staging Malignant neoplasm of upper-outer quadrant of left breast in female, estrogen receptor positive [place] Staging form: Breast, AJCC 8th Edition - Clinical stage from 07/16/2020: Stage IB (cT2, cN1, cM0, G2, ER+, PR+, HER2+) - Signed by FTruitt Merle MD on 07/21/2020    Malignant neoplasm of upper-outer  quadrant of left breast in female, estrogen receptor positive ([place])  07/01/2020 Mammogram    IMPRESSION: 1. There is a 12 cm span of highly suspicious calcifications involving the upper outer and upper inner quadrants of the left breast. On ultrasound, within this area of calcifications, there are confluent masses at 1 o'clock spanning at least 4.5 cm. An additional suspicious mass is seen in the left breast at 2 o'clock.  -Ultrasound targeted to the left breast at 1 o'clock, 5 cm from the nipple demonstrates a broad area of hypoechoic irregular tissue/confluent masses spanning at least 4.5 cm. There is a separate oval hypoechoic mass with indistinct margins at 2 o'clock, 7 cm from the nipple measuring 0.9 x 0.4 x 0.9 cm. Ultrasound of the left axilla demonstrates 2 abnormally thickened lymph nodes with cortices measuring 6-7 mm. There is an additional lymph node with a borderline cortex of 4 mm.   2. There are 2 lymph nodes with thickened cortices in the left axilla, and an additional lymph node with a borderline thickened cortex.   07/16/2020 Cancer Staging   Staging form: Breast, AJCC 8th Edition - Clinical stage from 07/16/2020: Stage IIA (cT3, cN1, cM0, G2, ER+, PR+, HER2+) - Signed by [name], MD on 08/05/2020    07/16/2020 Initial Biopsy   Diagnosis 1. 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 Breast, left, needle core biopsy, 1 o'clock - INVASIVE DUCTAL CARCINOMA - SEE COMMENT 3. Lymph node, biopsy - METASTATIC CARCINOMA INVOLVING A LYMPH NODE - SEE COMMENT Microscopic Comment 1. Based on the biopsy, the ductal carcinoma in situ has a comedo pattern, high nuclear grade and measures 0.7 cm in greatest linear extent. Prognostic markers (ER/PR) are pending and will be reported in an addendum. 2. Based on the biopsy, the carcinoma appears Nottingham grade 2 of 3 and measures 1.5 cm in greatest linear extent. Prognostic markers (ER/PR/ki-67/HER2) are  pending and will be reported in an addendum. Dr. Saralyn Pilar reviewed the case and agrees with the above diagnosis. These results were called to [facility] on July 17, 2020. 3. Based on the biopsy, the carcinoma appears measures 1.0 cm in greatest linear extent. Prognostic markers (ER/PR/ki-67/HER2) are pending and will be reported in an addendum.   07/16/2020 Receptors her2   1. PROGNOSTIC INDICATORS Results: IMMUNOHISTOCHEMICAL AND MORPHOMETRIC ANALYSIS PERFORMED MANUALLY Estrogen Receptor: 30%, POSITIVE, STRONG STAINING INTENSITY Progesterone Receptor: 5%, POSITIVE, STRONG STAINING INTENSITY  2. PROGNOSTIC INDICATORS Results: IMMUNOHISTOCHEMICAL AND MORPHOMETRIC ANALYSIS PERFORMED MANUALLY The tumor cells are POSITIVE for Her2 (3+). Estrogen Receptor: 85%, POSITIVE, STRONG STAINING INTENSITY Progesterone Receptor: 60%, POSITIVE, STRONG STAINING INTENSITY Proliferation Marker Ki67: 25%  3. PROGNOSTIC INDICATORS Results: IMMUNOHISTOCHEMICAL AND MORPHOMETRIC ANALYSIS PERFORMED MANUALLY The tumor cells are POSITIVE for Her2 (3+). Estrogen Receptor: 95%, POSITIVE, STRONG STAINING INTENSITY Progesterone Receptor: 45%, POSITIVE, STRONG STAINING INTENSITY Proliferation Marker Ki67: 30%   07/20/2020 Initial Diagnosis   Malignant neoplasm of upper-outer quadrant of left breast in female, estrogen receptor positive ([place])   07/31/2020 Echocardiogram   Baseline Echo  IMPRESSIONS     1. Left ventricular ejection fraction, by estimation, is 65 to 70%. The  left ventricle has normal function. The left ventricle has no regional  wall motion abnormalities. Left ventricular diastolic parameters are  indeterminate. The average left  ventricular global longitudinal strain is -17.1 %.   2. Right ventricular systolic function is normal. The right ventricular  size is normal. There is normal pulmonary artery systolic pressure.   3. The mitral valve is normal in structure.  Trivial mitral valve  regurgitation. No evidence of mitral stenosis.   4. The aortic valve is normal in structure. Aortic valve regurgitation is  not visualized. No aortic stenosis is present.    07/31/2020 Breast MRI   IMPRESSION: 1. Extensive non mass enhancement within the left breast extending from the nipple to the chest wall. Overall findings are most compatible with extensive malignancy involving the majority of the left breast. 2. Indeterminate enhancing mass within the upper inner right breast. 3. Three morphologically abnormal lymph nodes within the left axilla. One of these nodes has been biopsied compatible with metastatic adenopathy.     08/03/2020 Imaging   Bone Scan whole body  IMPRESSION: Increased radiotracer uptake in the mid to lower lumbar regions of uncertain etiology. Correlation with radiography to assess for potential arthropathy in these areas advised. Increased uptake in major joints is likely of arthropathic etiology. Increased uptake in the mid face is likely due to paranasal sinus disease. Distribution of radiotracer uptake in bony structures elsewhere unremarkable.   08/04/2020 Procedure   PAC placement by Dr [name]   08/05/2020 - 10/28/2020 Neo-Adjuvant Chemotherapy   Neoadjuvant THP q3weeks for 4-6 cycles starting 08/05/20, followed by Herceptin or Kadcyla q3weeks to complete 1 year of treatment. Stopped THP after 1 dose due to poor toleration       -I switched her to [place] starting 08/25/20. Stopped after C2 due to poor toleration (10/28/20)   08/07/2020 Imaging   CT CAP  IMPRESSION: 2.7 cm mass in the upper outer left breast, likely corresponding to the patient's known primary breast neoplasm.   8 mm short axis left axillary node, suspicious for nodal metastasis in this clinical context.   No findings suspicious for distant metastasis.   Endometrium is mildly prominent, measuring 11 mm. Correlate for vaginal bleeding and consider  pelvic ultrasound for further evaluation if clinically warranted.   08/11/2020 Pathology Results   Diagnosis Breast, right, needle core biopsy, right breast - PAPILLARY LESION - SEE COMMENT Microscopic Comment The biopsy consists of a papillary lesion with a focal area of adenosis. Immunohistochemistry (SMM, calponin and p63) shows retention of the myoepithelial cell layer in the focus of adenosis. 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 These results were called to [facility] on August 12, 2020.   10/27/2020 Imaging   CT Angio chest  IMPRESSION: Slightly suboptimal opacification of the main pulmonary artery, however no central or proximal segmental pulmonary embolism   Minimal bibasilar atelectasis   Aortic Atherosclerosis (ICD10-I70.0).   10/31/2020 Imaging   CT AP  IMPRESSION: 1. No acute finding by CT other than a large amount of stool and gas in the right colon and gas in the transverse colon. No small bowel obstruction. 2. Previous cholecystectomy. 3. Aortic atherosclerosis. 4. Umbilical hernia containing only fat. 5. Small amount of fluid in the endometrial canal as seen previously.   Aortic Atherosclerosis (ICD10-I70.0).   12/04/2020 Imaging   MRI Breast IMPRESSION: 1. Evidence of a positive response to interval treatment. There has been a significant improvement of both the extent and strength of the enhancement throughout the outer LEFT breast when compared to the earlier MRI of 07/31/2020. 2. However, there remains scattered areas of mass and non-mass enhancement throughout the outer LEFT breast, involving the upper outer quadrant and lower outer quadrant, as detailed above.  3. The 3 morphologically abnormal lymph nodes in the LEFT axilla identified on previous MRI, 1 a biopsy-proven metastasis, are all slightly smaller indicating an additional positive response to interval treatment. 4. No evidence of malignancy  within the RIGHT breast.   12/10/2020 -  Chemotherapy   Patient is on Treatment Plan : BREAST Trastuzumab + Perjeta (05/06/21) q21d     12/30/2020 Surgery   A. BREAST, LEFT, MASTECTOMY:  - Focal residual invasive ductal carcinoma status post neoadjuvant  treatment.  - Residual ductal carcinoma in situ.  - Margins of resection are not involved.  - Biopsy clip (X2).   B. LYMPH NODES, LEFT #1, DISSECTION:  - Three lymph nodes, negative for carcinoma (0/3).  - Biopsy clip (X1).   C. LYMPH NODES, LEFT #2, DISSECTION:  - Two lymph nodes, negative for carcinoma (0/2).   PROGNOSTIC INDICATOR RESULTS:  - The tumor cells are POSITIVE for Her2 (3+).  - Estrogen Receptor:       POSITIVE, 90%, STRONG STAINING  - Progesterone Receptor:   POSITIVE, 50%, MODERATE STAINING    12/30/2020 Cancer Staging   Staging form: Breast, AJCC 8th Edition - Pathologic stage from 12/30/2020: No Stage Recommended (ypT1a, pN0, cM0, G2, ER+, PR+, HER2+) - Signed by [name], MD on 01/20/2021 Stage prefix: Post-therapy Histologic grading system: 3 grade system Residual tumor (R): R0 - None    02/17/2021 - 04/08/2021 Radiation Therapy    Site Technique Total Dose (Gy) Dose per Fx (Gy) Completed Fx Beam Energies  Chest Wall, Left: CW_Lt 3D 50.4/50.4 1.8 28/28 10X  Chest Wall, Left: CW_Lt_SCLV 3D 50.4/50.4 1.8 28/28 6X, 15X  Chest Wall, Left: CW_Lt_Bst Electron 10/10 2 5/5 9E       INTERVAL HISTORY:  Cheryl Blankenship is here for a follow up of breast cancer. She was last seen by NP Lacie on 06/22/21. She presents to the clinic accompanied by her daughter. She reports she has had increased shortness of breath.   All other systems were reviewed with the patient and are negative.  MEDICAL HISTORY:  Past Medical History:  Diagnosis Date   Anxiety    Breast cancer ([place])    CHF (congestive heart failure) ([place])    Depression    Diabetes mellitus without complication ([place])    type 2   GERD (gastroesophageal reflux  disease)    History of kidney stones    Hypertension    PONV (postoperative nausea and vomiting)    Stroke ([place])     SURGICAL HISTORY: Past Surgical History:  Procedure Laterality Date   APPENDECTOMY     CHOLECYSTECTOMY     COLONOSCOPY     ESOPHAGOGASTRODUODENOSCOPY (EGD) WITH PROPOFOL N/A 11/01/2020   Procedure: ESOPHAGOGASTRODUODENOSCOPY (EGD) WITH PROPOFOL;  Surgeon: Arta Silence, MD;  Location: [facility];  Service: Endoscopy;  Laterality: N/A;   EYE SURGERY     cataracts   MASTECTOMY WITH AXILLARY LYMPH NODE DISSECTION Left 12/30/2020   Procedure: LEFT MASTECTOMY WITH TARGETED AXILLARY LYMPH NODE DISSECTION;  Surgeon: [name], MD;  Location: [facility];  Service: General;  Laterality: Left;   MULTIPLE TOOTH EXTRACTIONS     PORTACATH PLACEMENT N/A 08/04/2020   Procedure: INSERTION PORT-A-CATH WITH ULTRASOUND GUIDANCE;  Surgeon: [name], MD;  Location: [facility];  Service: General;  Laterality: N/A;   STOMACH SURGERY      I have reviewed the social history and family history with the patient and they are unchanged from previous note.  ALLERGIES:  is allergic to coconut (  cocos nucifera) allergy skin test, hydrochlorothiazide, lisinopril, and coconut oil.  MEDICATIONS:  Current Outpatient Medications  Medication Sig Dispense Refill   Accu-Chek FastClix Lancets MISC 1 each by Other route in the morning, at noon, and at bedtime.     albuterol (VENTOLIN HFA) 108 (90 Base) MCG/ACT inhaler Inhale 1-2 puffs into the lungs every 4 (four) hours as needed for wheezing or shortness of breath. 8 g 0   ALPRAZolam (XANAX) 0.25 MG tablet Take 1-2 tablets (0.25-0.5 mg total) by mouth 3 (three) times daily as needed for anxiety or sleep. 30 tablet 0   amLODipine (NORVASC) 10 MG tablet Take 1 tablet (10 mg total) by mouth daily. 90 tablet 3   aspirin 81 MG chewable tablet Chew 81 mg by mouth daily.      bisacodyl (DULCOLAX) 5 MG EC tablet Take 1 tablet (5 mg total) by mouth daily as  needed for moderate constipation. 30 tablet 0   carvedilol (COREG) 25 MG tablet Take 1 tablet (25 mg total) by mouth 2 (two) times daily with a meal. 180 tablet 3   Cholecalciferol 25 MCG (1000 UT) capsule Take 1,000 Units by mouth daily.     diphenoxylate-atropine (LOMOTIL) 2.5-0.025 MG tablet TAKE 1 OR 2 TABLETS BY MOUTH 4 TIMES DAILY AS NEEDED FOR DIARRHEA OR loose stools 60 tablet 2   DULoxetine (CYMBALTA) 60 MG capsule Take 60 mg by mouth daily.     exemestane (AROMASIN) 25 MG tablet Take 1 tablet (25 mg total) by mouth daily after breakfast. 90 tablet 1   folic acid (FOLVITE) 1 MG tablet Take 1 mg by mouth daily.     furosemide (LASIX) 20 MG tablet Take 1 tablet (20 mg total) by mouth daily as needed (for swelling or weight gain). 90 tablet 3   glipiZIDE (GLUCOTROL) 10 MG tablet Take 10 mg by mouth daily before breakfast.     insulin aspart (NOVOLOG) 100 UNIT/ML injection Inject 2-10 Units into the skin 3 (three) times daily before meals. Per sliding scale:  150-199 = 2 units, 200-249 = 4 units, 250-299 = 6 units, 300-349 = 8 units, greater than350 = 10 units.     insulin glargine (LANTUS SOLOSTAR) 100 UNIT/ML Solostar Pen Inject 35 Units into the skin 2 (two) times daily. (Patient taking differently: Inject 70 Units into the skin 2 (two) times daily. Pt takes 70 units in the morning and 70 units at night.) 15 mL 11   lidocaine-prilocaine (EMLA) cream Apply 1 application topically as needed (pain). (Patient taking differently: Apply 1 application topically daily.) 30 g 3   losartan (COZAAR) 100 MG tablet Take 1 tablet (100 mg total) by mouth daily. 90 tablet 3   metFORMIN (GLUCOPHAGE) 500 MG tablet Take 500 mg by mouth 2 (two) times daily with a meal.      methocarbamol (ROBAXIN) 500 MG tablet Take 500 mg by mouth every 8 (eight) hours as needed for muscle spasms.     Omega-3 Fatty Acids (FISH OIL) 1000 MG CAPS Take 1,000 mg by mouth daily.     omeprazole (PRILOSEC) 20 MG capsule Take 1 capsule  (20 mg total) by mouth daily. 30 capsule 11   ondansetron (ZOFRAN-ODT) 4 MG disintegrating tablet TAKE 1 TABLET BY MOUTH 2 TIMES DAILY AS NEEDED FOR NAUSEA AND VOMITING 30 tablet 2   pregabalin (LYRICA) 75 MG capsule Take 1 capsule (75 mg total) by mouth 2 (two) times daily. 60 capsule 3   prochlorperazine (COMPAZINE) 10 MG tablet Take  1 tablet (10 mg total) by mouth every 6 (six) hours as needed for nausea or vomiting. 30 tablet 5   PROMETHEGAN 25 MG suppository place 1 SUPPOSITORY rectally EVERY 8 HOURS AS NEEDED FOR NAUSEA AND VOMITING 30 suppository 1   RESTASIS 0.05 % ophthalmic emulsion Place 1 drop into both eyes 2 (two) times daily as needed (dry eyes).     rOPINIRole (REQUIP) 0.25 MG tablet Take 0.58m TID x7 days, then take 0.538mTID x7 days, then take 0.7573mID x7 days, then take 1mg82mD 90 tablet 1   spironolactone (ALDACTONE) 25 MG tablet Take 1 tablet (25 mg total) by mouth daily. 90 tablet 3   sucralfate (CARAFATE) 1 GM/10ML suspension Take 10 mLs (1 g total) by mouth 4 (four) times daily -  with meals and at bedtime. 420 mL 0   vitamin B-12 (CYANOCOBALAMIN) 1000 MCG tablet Take 1,000 mcg by mouth daily.     zolpidem (AMBIEN) 5 MG tablet Take 1 tablet (5 mg total) by mouth at bedtime as needed for sleep. 20 tablet 0   No current facility-administered medications for this visit.   Facility-Administered Medications Ordered in Other Visits  Medication Dose Route Frequency Provider Last Rate Last Admin   sodium chloride flush (NS) 0.9 % injection 10 mL  10 mL Intracatheter PRN FengTruitt Merle   10 mL at 07/19/21 1617    PHYSICAL EXAMINATION: ECOG PERFORMANCE STATUS: 2 - Symptomatic, <50% confined to bed  Vitals:   07/19/21 1350  BP: 117/64  Pulse: 65  Resp: 18  Temp: 98.8 F (37.1 C)  SpO2: 97%   Wt Readings from Last 3 Encounters:  07/19/21 214 lb 12.8 oz (97.4 kg)  06/22/21 214 lb 11.2 oz (97.4 kg)  06/15/21 209 lb 4.8 oz (94.9 kg)     GENERAL:alert, no distress and  comfortable SKIN: skin color normal, no rashes or significant lesions EYES: normal, Conjunctiva are pink and non-injected, sclera clear  NEURO: alert & oriented x 3 with fluent speech  LABORATORY DATA:  I have reviewed the data as listed CBC Latest Ref Rng & Units 07/19/2021 06/22/2021 05/27/2021  WBC 4.0 - 10.5 K/uL 5.1 5.6 5.7  Hemoglobin 12.0 - 15.0 g/dL 10.7(L) 9.7(L) 10.8(L)  Hematocrit 36.0 - 46.0 % 33.9(L) 29.9(L) 34.0(L)  Platelets 150 - 400 K/uL 274 245 411(H)     CMP Latest Ref Rng & Units 07/19/2021 06/22/2021 05/27/2021  Glucose 70 - 99 mg/dL 226(H) 315(H) 236(H)  BUN 8 - 23 mg/dL 42(H) 20 30(H)  Creatinine 0.44 - 1.00 mg/dL 1.65(H) 1.14(H) 1.41(H)  Sodium 135 - 145 mmol/L 141 141 141  Potassium 3.5 - 5.1 mmol/L 4.3 3.8 3.8  Chloride 98 - 111 mmol/L 104 107 100  CO2 22 - 32 mmol/L 29 25 29   Calcium 8.9 - 10.3 mg/dL 10.6(H) 10.3 11.0(H)  Total Protein 6.5 - 8.1 g/dL 7.7 7.2 7.5  Total Bilirubin 0.3 - 1.2 mg/dL 0.5 0.4 0.4  Alkaline Phos 38 - 126 U/L 76 70 71  AST 15 - 41 U/L 11(L) 13(L) 11(L)  ALT 0 - 44 U/L 11 12 15       RADIOGRAPHIC STUDIES: I have personally reviewed the radiological images as listed and agreed with the findings in the report. No results found.    Orders Placed This Encounter  Procedures   MM Digital Screening Unilat R    Standing Status:   Future    Standing Expiration Date:   07/19/2022    Order Specific  Question:   Reason for Exam (SYMPTOM  OR DIAGNOSIS REQUIRED)    Answer:   screening    Order Specific Question:   Preferred imaging location?    Answer:   [facility]   All questions were answered. The patient knows to call the clinic with any problems, questions or concerns. No barriers to learning was detected. The total time spent in the appointment was 30 minutes.     [name], MD 07/19/2021   I, Wilburn Mylar, am acting as scribe for [name], MD.   I have reviewed the above documentation for accuracy and completeness,  and I agree with the above.

## 2021-07-19 NOTE — Patient Instructions (Signed)
[facility] MEDICAL ONCOLOGY  Discharge Instructions: ?Thank you for choosing [facility] to provide your oncology and hematology care.  ? ?If you have a lab appointment with the Cancer Center, please go directly to the Cancer Center and check in at the registration area. ?  ?Wear comfortable clothing and clothing appropriate for easy access to any Portacath or PICC line.  ? ?We strive to give you quality time with your provider. You may need to reschedule your appointment if you arrive late (15 or more minutes).  Arriving late affects you and other patients whose appointments are after yours.  Also, if you miss three or more appointments without notifying the office, you may be dismissed from the clinic at the provider?s discretion.    ?  ?For prescription refill requests, have your pharmacy contact our office and allow 72 hours for refills to be completed.   ? ?Today you received the following chemotherapy and/or immunotherapy agent: Trastuzumab ?  ?To help prevent nausea and vomiting after your treatment, we encourage you to take your nausea medication as directed. ? ?BELOW ARE SYMPTOMS THAT SHOULD BE REPORTED IMMEDIATELY: ?*FEVER GREATER THAN 100.4 F (38 ?C) OR HIGHER ?*CHILLS OR SWEATING ?*NAUSEA AND VOMITING THAT IS NOT CONTROLLED WITH YOUR NAUSEA MEDICATION ?*UNUSUAL SHORTNESS OF BREATH ?*UNUSUAL BRUISING OR BLEEDING ?*URINARY PROBLEMS (pain or burning when urinating, or frequent urination) ?*BOWEL PROBLEMS (unusual diarrhea, constipation, pain near the anus) ?TENDERNESS IN MOUTH AND THROAT WITH OR WITHOUT PRESENCE OF ULCERS (sore throat, sores in mouth, or a toothache) ?UNUSUAL RASH, SWELLING OR PAIN  ?UNUSUAL VAGINAL DISCHARGE OR ITCHING  ? ?Items with * indicate a potential emergency and should be followed up as soon as possible or go to the Emergency Department if any problems should occur. ? ?Please show the CHEMOTHERAPY ALERT CARD or IMMUNOTHERAPY ALERT CARD at check-in to  the Emergency Department and triage nurse. ? ?Should you have questions after your visit or need to cancel or reschedule your appointment, please contact [facility]  Dept: [phone]  and follow the prompts.  Office hours are 8:00 a.m. to 4:30 p.m. Monday - Friday. Please note that voicemails left after 4:00 p.m. may not be returned until the following business day.  We are closed weekends and major holidays. You have access to a nurse at all times for urgent questions. Please call the main number to the clinic Dept: [phone] and follow the prompts. ? ? ?For any non-urgent questions, you may also contact your provider using MyChart. We now offer e-Visits for anyone 18 and older to request care online for non-urgent symptoms. For details visit [email or web address]. ?  ?Also download the MyChart app! Go to the app store, search "MyChart", open the app, select [facility], and log in with your MyChart username and password. ? ?Due to Covid, a mask is required upon entering the hospital/clinic. If you do not have a mask, one will be given to you upon arrival. For doctor visits, patients may have 1 support person aged 18 or older with them. For treatment visits, patients cannot have anyone with them due to current Covid guidelines and our immunocompromised population.  ? ?

## 2021-07-20 ENCOUNTER — Telehealth: Payer: Self-pay | Admitting: Hematology

## 2021-07-20 ENCOUNTER — Other Ambulatory Visit: Payer: Self-pay | Admitting: Internal Medicine

## 2021-07-20 NOTE — Telephone Encounter (Signed)
Scheduled follow-up appointments per 10/31 los. Patient is aware. 

## 2021-07-26 ENCOUNTER — Telehealth: Payer: Self-pay

## 2021-07-26 NOTE — Telephone Encounter (Signed)
LVM on pt's preferred contact number regarding follow-up appt needed with PCP Dr. Cipriano Mile, DNP regarding her recent labs drawn on 07/19/2021.  Cira Rue, NP has concerns about the pt's glucose and kidney function test.

## 2021-08-03 ENCOUNTER — Inpatient Hospital Stay (HOSPITAL_COMMUNITY)
Admission: EM | Admit: 2021-08-03 | Discharge: 2021-08-09 | DRG: 637 | Disposition: A | Discharge status: Home Health | Payer: Medicare HMO | Attending: Internal Medicine | Admitting: Internal Medicine

## 2021-08-03 ENCOUNTER — Emergency Department (HOSPITAL_COMMUNITY): Payer: Medicare HMO

## 2021-08-03 ENCOUNTER — Other Ambulatory Visit: Payer: Self-pay

## 2021-08-03 ENCOUNTER — Encounter (HOSPITAL_COMMUNITY): Payer: Self-pay

## 2021-08-03 DIAGNOSIS — J189 Pneumonia, unspecified organism: Secondary | ICD-10-CM

## 2021-08-03 DIAGNOSIS — X58XXXA Exposure to other specified factors, initial encounter: Secondary | ICD-10-CM | POA: Diagnosis present

## 2021-08-03 DIAGNOSIS — E1142 Type 2 diabetes mellitus with diabetic polyneuropathy: Secondary | ICD-10-CM | POA: Diagnosis present

## 2021-08-03 DIAGNOSIS — Z6837 Body mass index (BMI) 37.0-37.9, adult: Secondary | ICD-10-CM

## 2021-08-03 DIAGNOSIS — M1711 Unilateral primary osteoarthritis, right knee: Secondary | ICD-10-CM | POA: Diagnosis present

## 2021-08-03 DIAGNOSIS — Z6841 Body Mass Index (BMI) 40.0 and over, adult: Secondary | ICD-10-CM

## 2021-08-03 DIAGNOSIS — M11261 Other chondrocalcinosis, right knee: Secondary | ICD-10-CM

## 2021-08-03 DIAGNOSIS — I110 Hypertensive heart disease with heart failure: Secondary | ICD-10-CM | POA: Diagnosis present

## 2021-08-03 DIAGNOSIS — E7800 Pure hypercholesterolemia, unspecified: Secondary | ICD-10-CM | POA: Diagnosis present

## 2021-08-03 DIAGNOSIS — Z853 Personal history of malignant neoplasm of breast: Secondary | ICD-10-CM

## 2021-08-03 DIAGNOSIS — E876 Hypokalemia: Secondary | ICD-10-CM | POA: Diagnosis present

## 2021-08-03 DIAGNOSIS — Z888 Allergy status to other drugs, medicaments and biological substances status: Secondary | ICD-10-CM

## 2021-08-03 DIAGNOSIS — Z20822 Contact with and (suspected) exposure to covid-19: Secondary | ICD-10-CM | POA: Diagnosis present

## 2021-08-03 DIAGNOSIS — G20 Parkinson's disease: Secondary | ICD-10-CM | POA: Diagnosis present

## 2021-08-03 DIAGNOSIS — E162 Hypoglycemia, unspecified: Secondary | ICD-10-CM | POA: Diagnosis not present

## 2021-08-03 DIAGNOSIS — J690 Pneumonitis due to inhalation of food and vomit: Secondary | ICD-10-CM | POA: Diagnosis present

## 2021-08-03 DIAGNOSIS — E11649 Type 2 diabetes mellitus with hypoglycemia without coma: Principal | ICD-10-CM | POA: Diagnosis present

## 2021-08-03 DIAGNOSIS — R52 Pain, unspecified: Secondary | ICD-10-CM

## 2021-08-03 DIAGNOSIS — N179 Acute kidney failure, unspecified: Secondary | ICD-10-CM | POA: Diagnosis present

## 2021-08-03 DIAGNOSIS — I5032 Chronic diastolic (congestive) heart failure: Secondary | ICD-10-CM | POA: Diagnosis present

## 2021-08-03 DIAGNOSIS — M546 Pain in thoracic spine: Secondary | ICD-10-CM | POA: Diagnosis present

## 2021-08-03 DIAGNOSIS — T68XXXA Hypothermia, initial encounter: Secondary | ICD-10-CM | POA: Diagnosis present

## 2021-08-03 DIAGNOSIS — Z7982 Long term (current) use of aspirin: Secondary | ICD-10-CM

## 2021-08-03 DIAGNOSIS — C50919 Malignant neoplasm of unspecified site of unspecified female breast: Secondary | ICD-10-CM | POA: Diagnosis present

## 2021-08-03 DIAGNOSIS — G20A1 Parkinson's disease without dyskinesia, without mention of fluctuations: Secondary | ICD-10-CM | POA: Diagnosis present

## 2021-08-03 DIAGNOSIS — Z79899 Other long term (current) drug therapy: Secondary | ICD-10-CM

## 2021-08-03 DIAGNOSIS — Z794 Long term (current) use of insulin: Secondary | ICD-10-CM

## 2021-08-03 DIAGNOSIS — E669 Obesity, unspecified: Secondary | ICD-10-CM | POA: Diagnosis present

## 2021-08-03 DIAGNOSIS — M109 Gout, unspecified: Secondary | ICD-10-CM | POA: Diagnosis present

## 2021-08-03 DIAGNOSIS — Z91018 Allergy to other foods: Secondary | ICD-10-CM

## 2021-08-03 DIAGNOSIS — E1165 Type 2 diabetes mellitus with hyperglycemia: Secondary | ICD-10-CM | POA: Diagnosis not present

## 2021-08-03 DIAGNOSIS — Z9012 Acquired absence of left breast and nipple: Secondary | ICD-10-CM

## 2021-08-03 HISTORY — DX: Type 2 diabetes mellitus without complications: E11.9

## 2021-08-03 HISTORY — DX: Essential (primary) hypertension: I10

## 2021-08-03 HISTORY — DX: Malignant (primary) neoplasm, unspecified: C80.1

## 2021-08-03 HISTORY — DX: Pure hypercholesterolemia, unspecified: E78.00

## 2021-08-03 HISTORY — DX: Heart failure, unspecified: I50.9

## 2021-08-03 LAB — CBC WITH DIFFERENTIAL/PLATELET
Abs Immature Granulocytes: 0.02 10*3/uL (ref 0.00–0.07)
Basophils Absolute: 0 10*3/uL (ref 0.0–0.1)
Basophils Relative: 1 %
Eosinophils Absolute: 0.2 10*3/uL (ref 0.0–0.5)
Eosinophils Relative: 2 %
HCT: 38.5 % (ref 36.0–46.0)
Hemoglobin: 12.1 g/dL (ref 12.0–15.0)
Immature Granulocytes: 0 %
Lymphocytes Relative: 18 %
Lymphs Abs: 1.1 10*3/uL (ref 0.7–4.0)
MCH: 28.3 pg (ref 26.0–34.0)
MCHC: 31.4 g/dL (ref 30.0–36.0)
MCV: 90 fL (ref 80.0–100.0)
Monocytes Absolute: 0.4 10*3/uL (ref 0.1–1.0)
Monocytes Relative: 7 %
Neutro Abs: 4.4 10*3/uL (ref 1.7–7.7)
Neutrophils Relative %: 72 %
Platelets: 240 10*3/uL (ref 150–400)
RBC: 4.28 MIL/uL (ref 3.87–5.11)
RDW: 13.3 % (ref 11.5–15.5)
WBC: 6.2 10*3/uL (ref 4.0–10.5)
nRBC: 0 % (ref 0.0–0.2)

## 2021-08-03 LAB — URINALYSIS, ROUTINE W REFLEX MICROSCOPIC
Bilirubin Urine: NEGATIVE
Glucose, UA: 50 mg/dL — AB
Hgb urine dipstick: NEGATIVE
Ketones, ur: NEGATIVE mg/dL
Leukocytes,Ua: NEGATIVE
Nitrite: NEGATIVE
Protein, ur: NEGATIVE mg/dL
Specific Gravity, Urine: 1.012 (ref 1.005–1.030)
pH: 7 (ref 5.0–8.0)

## 2021-08-03 LAB — COMPREHENSIVE METABOLIC PANEL
ALT: 14 U/L (ref 0–44)
AST: 18 U/L (ref 15–41)
Albumin: 4 g/dL (ref 3.5–5.0)
Alkaline Phosphatase: 62 U/L (ref 38–126)
Anion gap: 8 (ref 5–15)
BUN: 17 mg/dL (ref 8–23)
CO2: 26 mmol/L (ref 22–32)
Calcium: 10.7 mg/dL — ABNORMAL HIGH (ref 8.9–10.3)
Chloride: 108 mmol/L (ref 98–111)
Creatinine, Ser: 0.92 mg/dL (ref 0.44–1.00)
GFR, Estimated: >60 mL/min (ref >60)
Glucose, Bld: 71 mg/dL (ref 70–99)
Potassium: 4.6 mmol/L (ref 3.5–5.1)
Sodium: 142 mmol/L (ref 135–145)
Total Bilirubin: 0.6 mg/dL (ref 0.3–1.2)
Total Protein: 7.5 g/dL (ref 6.5–8.1)

## 2021-08-03 LAB — CBG MONITORING, ED
Glucose-Capillary: 63 mg/dL — ABNORMAL LOW (ref 70–99)
Glucose-Capillary: 139 mg/dL — ABNORMAL HIGH (ref 70–99)
Glucose-Capillary: 104 mg/dL — ABNORMAL HIGH (ref 70–99)

## 2021-08-03 LAB — TROPONIN I (HIGH SENSITIVITY): Troponin I (High Sensitivity): 5 ng/L (ref <18)

## 2021-08-03 MED ORDER — SODIUM CHLORIDE 0.9% FLUSH: 3.0000 mL | INTRAVENOUS | Status: DC | PRN

## 2021-08-03 MED ORDER — SODIUM CHLORIDE 0.9 % IV SOLN
500.0000 mg | Freq: Once | INTRAVENOUS | Status: AC
  Administered 2021-08-04: 500 mg via INTRAVENOUS
  Filled 2021-08-03: qty 500

## 2021-08-03 MED ORDER — DEXTROSE 50 % IV SOLN
INTRAVENOUS | Status: AC
  Administered 2021-08-03: 50 mL
  Filled 2021-08-03: qty 50

## 2021-08-03 MED ORDER — SODIUM CHLORIDE 0.9 % IV SOLN: 250.0000 mL | INTRAVENOUS | Status: DC | PRN

## 2021-08-03 MED ORDER — SODIUM CHLORIDE 0.9 % IV SOLN
1.0000 g | Freq: Once | INTRAVENOUS | Status: AC
  Administered 2021-08-04: 1 g via INTRAVENOUS
  Filled 2021-08-03: qty 10

## 2021-08-03 MED ORDER — SODIUM CHLORIDE 0.9% FLUSH
3.0000 mL | Freq: Two times a day (BID) | INTRAVENOUS | Status: DC
  Administered 2021-08-04 – 2021-08-09 (×11): 3 mL via INTRAVENOUS

## 2021-08-03 NOTE — ED Triage Notes (Signed)
Pt BIB EMS for hypoglycemia and unresponsive. EMS report they were out at her house earlier with a BGL of less than 60. Pt refused transport. EMS were called out a second time and pt was unresponsive. EMS admin glucagon and started and IO of D10. EMS report last BGL of 60.

## 2021-08-03 NOTE — ED Provider Notes (Signed)
[facility] EMERGENCY DEPARTMENT Provider Note   CSN: [id number] Arrival date & time: 08/03/21  2118     History Chief Complaint  Patient presents with   Hypoglycemia    Henna Derderian is a 70 y.o. female.  HPI 70 year old female presents with hypoglycemia and altered mental status. EMS was called to her house twice. The first time patient refused care. 2nd time she was unresponsive. They gave glucagon and put in IO and started D10. Glucose 60 on arrival here. Patient endorses left upper back pain for a couple days. No chest pain.  Further history from daughter at the bedside later indicates that the patient was at the grocery store and she was acting odd and sleepy.  EMS was called and she was treated for hypoglycemia but then discharged when she felt better and did not want to go to the hospital.  Family knows she was unresponsive at home later and so 911 was called.  Patient tells me she has had 1 [place] sandwich today but otherwise has been taking her diabetic meds and not eating properly.  She has been dealing with some left trapezius back pain for a couple days.  Past Medical History:  Diagnosis Date   Cancer ([place])    Congestive heart failure (CHF) ([place])    Diabetes mellitus without complication ([place])    High cholesterol    Hypertension     Patient Active Problem List   Diagnosis Date Noted   Hypoglycemia 08/03/2021       OB History   No obstetric history on file.     No family history on file.  Social History   Tobacco Use   Smoking status: Never   Smokeless tobacco: Never  Vaping Use   Vaping Use: Never used  Substance Use Topics   Alcohol use: Not Currently   Drug use: Never    Home Medications Prior to Admission medications   Medication Sig Start Date End Date Taking? Authorizing Provider  amLODipine (NORVASC) 10 MG tablet Take 10 mg by mouth daily.   Yes [provider]  aspirin 81 MG chewable tablet Chew 81 mg by mouth  daily.   Yes [provider]  carvedilol (COREG) 25 MG tablet Take 25 mg by mouth 2 (two) times daily with a meal.   Yes [provider]  diphenoxylate-atropine (LOMOTIL) 2.5-0.025 MG tablet Take 1-2 tablets by mouth 4 (four) times daily as needed for diarrhea or loose stools.   Yes [provider]  exemestane (AROMASIN) 25 MG tablet Take 25 mg by mouth daily after breakfast.   Yes [provider]  folic acid (FOLVITE) 1 MG tablet Take 1 mg by mouth daily.   Yes [provider]  furosemide (LASIX) 20 MG tablet Take 20 mg by mouth daily as needed (swelling/weight gain).   Yes [provider]  gabapentin (NEURONTIN) 400 MG capsule Take 400 mg by mouth 3 (three) times daily.   Yes [provider]  Insulin Aspart (NOVOLOG FLEXPEN ) Inject 0-10 Units into the skin See admin instructions. 0-150 = 0 units 150-199 = 2 units 200-249 = 4 units 250-299 = 6 units 300-349 = 8 units Greater than 350 = 10 units   Yes [provider]  insulin glargine (LANTUS) 100 UNIT/ML injection Inject 70 Units into the skin 2 (two) times daily.   Yes [provider]  losartan (COZAAR) 100 MG tablet Take 100 mg by mouth daily.   Yes [provider]  methocarbamol (ROBAXIN) 500 MG tablet Take 500 mg by mouth every 8 (eight) hours as needed for muscle spasms.   Yes [provider]  omeprazole (PRILOSEC) 20 MG capsule Take 20 mg by mouth daily.   Yes [provider]  ondansetron (ZOFRAN-ODT) 4 MG disintegrating tablet Take 4 mg by mouth 2 (two) times daily as needed for nausea or vomiting.   Yes [provider]  pregabalin (LYRICA) 75 MG capsule Take 75 mg by mouth 2 (two) times daily.   Yes [provider]  promethazine (PHENERGAN) 25 MG suppository Place 25 mg rectally every 8 (eight) hours as needed for nausea or vomiting.   Yes [provider]  rOPINIRole (REQUIP) 0.25 MG tablet Take 0.25  mg by mouth See admin instructions. 1 tab tid x 7 days, 2 tabs tid x 7 days, 3 tabs tid x 7 days, then 4 tabs tid   Yes [provider]  spironolactone (ALDACTONE) 25 MG tablet Take 25 mg by mouth daily.   Yes [provider]  vitamin B-12 (CYANOCOBALAMIN) 1000 MCG tablet Take 1,000 mcg by mouth daily.   Yes [provider]  zolpidem (AMBIEN) 5 MG tablet Take 5 mg by mouth at bedtime as needed for sleep.   Yes [provider]    Allergies    Coconut (cocos nucifera) allergy skin test, Hydrochlorothiazide, Other, and Lisinopril  Review of Systems   Review of Systems  Constitutional:  Negative for fever.  Respiratory:  Positive for cough. Negative for shortness of breath.   Cardiovascular:  Negative for chest pain.  Gastrointestinal:  Negative for abdominal pain.  Musculoskeletal:  Positive for back pain.  Neurological:  Negative for weakness and numbness.  Psychiatric/Behavioral:  Positive for confusion.   All other systems reviewed and are negative.  Physical Exam Updated Vital Signs BP (!) 181/88   Pulse (!) 54   Temp (S) (!) 91.3 F (32.9 C) (Rectal)   Resp 14   Ht 5\' 3"  (1.6 m)   Wt 97.1 kg   SpO2 98%   BMI 37.91 kg/m   Physical Exam Vitals and nursing note reviewed.  Constitutional:      Appearance: She is well-developed. She is obese. She is not diaphoretic.  HENT:     Head: Normocephalic and atraumatic.     Right Ear: External ear normal.     Left Ear: External ear normal.     Nose: Nose normal.  Eyes:     General:        Right eye: No discharge.        Left eye: No discharge.  Neck:   Cardiovascular:     Rate and Rhythm: Normal rate and regular rhythm.     Heart sounds: Normal heart sounds.  Pulmonary:     Effort: Pulmonary effort is normal.     Breath sounds: Normal breath sounds.  Abdominal:     Palpations: Abdomen is soft.     Tenderness: There is no abdominal tenderness.  Skin:    General: Skin is warm and dry.   Neurological:     Mental Status: She is alert.     Comments: Awake, alert, oriented after being given glucose by nursing staff.  Generally weak but symmetric strength in all 4 extremities.  It takes her a few minutes but she is able to tell me that she is in [place] at the hospital and it is November 2022.  Psychiatric:  Mood and Affect: Mood is not anxious.    ED Results / Procedures / Treatments   Labs (all labs ordered are listed, but only abnormal results are displayed) Labs Reviewed  COMPREHENSIVE METABOLIC PANEL - Abnormal; Notable for the following components:      Result Value   Calcium 10.7 (*)    All other components within normal limits  URINALYSIS, ROUTINE W REFLEX MICROSCOPIC - Abnormal; Notable for the following components:   APPearance HAZY (*)    Glucose, UA 50 (*)    All other components within normal limits  CBG MONITORING, ED - Abnormal; Notable for the following components:   Glucose-Capillary 63 (*)    All other components within normal limits  CBG MONITORING, ED - Abnormal; Notable for the following components:   Glucose-Capillary 139 (*)    All other components within normal limits  CBG MONITORING, ED - Abnormal; Notable for the following components:   Glucose-Capillary 104 (*)    All other components within normal limits  CULTURE, BLOOD (ROUTINE X 2)  CULTURE, BLOOD (ROUTINE X 2)  RESP PANEL BY RT-PCR (FLU A&B, COVID) ARPGX2  CBC WITH DIFFERENTIAL/PLATELET  CBG MONITORING, ED  TROPONIN I (HIGH SENSITIVITY)  TROPONIN I (HIGH SENSITIVITY)    EKG EKG Interpretation  Date/Time:  Tuesday August 03 2021 21:52:51 EST Ventricular Rate:  56 PR Interval:  212 QRS Duration: 131 QT Interval:  466 QTC Calculation: 450 R Axis:   -39 Text Interpretation: Sinus rhythm Borderline prolonged PR interval Left bundle branch block Baseline wander in lead(s) V3 No old tracing to compare Confirmed by Sherwood Gambler [phone]) on 08/03/2021 10:05:35  PM  Radiology DG Chest Port 1 View  Result Date: 08/03/2021 CLINICAL DATA:  Back pain. EXAM: PORTABLE CHEST 1 VIEW COMPARISON:  No prior exams available FINDINGS: Right chest port with tip in the lower SVC. Upper normal heart size/mild cardiomegaly. Normal mediastinal contours. Patchy airspace disease in the right mid lower lung zone. Mild vascular congestion without pulmonary edema. There may be a small right pleural effusion. No pneumothorax. On limited evaluation, no acute osseous abnormalities are seen. Endplate spurring is seen in the thoracic spine, though not well assessed on this portable AP view. Surgical clips in the left axilla. IMPRESSION: 1. Patchy airspace disease in the right mid lower lung zone, suspicious for pneumonia. Possible small right pleural effusion. 2. Upper normal heart size/mild cardiomegaly. Mild vascular congestion without pulmonary edema. Electronically Signed   By: Keith Rake M.D.   On: 08/03/2021 22:55    Procedures .Critical Care Performed by: Sherwood Gambler, MD Authorized by: Sherwood Gambler, MD   Critical care provider statement:    Critical care time (minutes):  35   Critical care time was exclusive of:  Separately billable procedures and treating other patients   Critical care was necessary to treat or prevent imminent or life-threatening deterioration of the following conditions:  Respiratory failure, CNS failure or compromise and endocrine crisis   Critical care was time spent personally by me on the following activities:  Development of treatment plan with patient or surrogate, discussions with consultants, evaluation of patient's response to treatment, examination of patient, ordering and review of laboratory studies, ordering and review of radiographic studies, ordering and performing treatments and interventions, pulse oximetry, re-evaluation of patient's condition and review of old charts   Medications Ordered in ED Medications  sodium chloride  flush (NS) 0.9 % injection 3 mL (3 mLs Intravenous Not Given 08/03/21 2220)  sodium chloride flush (NS)  0.9 % injection 3 mL (has no administration in time range)  0.9 %  sodium chloride infusion (has no administration in time range)  cefTRIAXone (ROCEPHIN) 1 g in sodium chloride 0.9 % 100 mL IVPB (has no administration in time range)  azithromycin (ZITHROMAX) 500 mg in sodium chloride 0.9 % 250 mL IVPB (has no administration in time range)  dextrose 50 % solution (50 mLs  Given 08/03/21 2125)    ED Course  I have reviewed the triage vital signs and the nursing notes.  Pertinent labs & imaging results that were available during my care of the patient were reviewed by me and considered in my medical decision making (see chart for details).    MDM Rules/Calculators/A&P                           Patient was given D50 on arrival.  Mental status has cleared up according to family.  She has been dealing with a cough over the last couple weeks and was put on some sort of antibiotic.  X-ray here does show pneumonia.  This would go along with her continued cough.  She took Lantus at around 4 PM and I think she is at high risk of dropping again, especially after needing two rescue doses by EMS and once here.  We will get blood cultures, she is on a [facility], and will admit for further monitoring and treatment. Final Clinical Impression(s) / ED Diagnoses Final diagnoses:  Hypoglycemia  Community acquired pneumonia of right lung, unspecified part of lung    Rx / DC Orders ED Discharge Orders     None        Sherwood Gambler, MD 08/03/21 2322

## 2021-08-04 ENCOUNTER — Ambulatory Visit: Payer: Medicare HMO | Admitting: Hematology

## 2021-08-04 ENCOUNTER — Telehealth: Payer: Self-pay

## 2021-08-04 ENCOUNTER — Encounter: Payer: Self-pay | Admitting: *Deleted

## 2021-08-04 ENCOUNTER — Ambulatory Visit: Payer: Medicare HMO

## 2021-08-04 ENCOUNTER — Encounter (HOSPITAL_COMMUNITY): Payer: Self-pay | Admitting: Internal Medicine

## 2021-08-04 ENCOUNTER — Other Ambulatory Visit: Payer: Medicare HMO

## 2021-08-04 DIAGNOSIS — I5032 Chronic diastolic (congestive) heart failure: Secondary | ICD-10-CM

## 2021-08-04 DIAGNOSIS — G20 Parkinson's disease: Secondary | ICD-10-CM | POA: Diagnosis present

## 2021-08-04 DIAGNOSIS — J189 Pneumonia, unspecified organism: Secondary | ICD-10-CM | POA: Diagnosis not present

## 2021-08-04 DIAGNOSIS — C50919 Malignant neoplasm of unspecified site of unspecified female breast: Secondary | ICD-10-CM | POA: Diagnosis not present

## 2021-08-04 DIAGNOSIS — E162 Hypoglycemia, unspecified: Secondary | ICD-10-CM

## 2021-08-04 DIAGNOSIS — T68XXXA Hypothermia, initial encounter: Secondary | ICD-10-CM

## 2021-08-04 DIAGNOSIS — G20A1 Parkinson's disease without dyskinesia, without mention of fluctuations: Secondary | ICD-10-CM | POA: Diagnosis present

## 2021-08-04 LAB — CBG MONITORING, ED
Glucose-Capillary: 46 mg/dL — ABNORMAL LOW (ref 70–99)
Glucose-Capillary: 154 mg/dL — ABNORMAL HIGH (ref 70–99)
Glucose-Capillary: 148 mg/dL — ABNORMAL HIGH (ref 70–99)
Glucose-Capillary: 171 mg/dL — ABNORMAL HIGH (ref 70–99)
Glucose-Capillary: 188 mg/dL — ABNORMAL HIGH (ref 70–99)
Glucose-Capillary: 145 mg/dL — ABNORMAL HIGH (ref 70–99)
Glucose-Capillary: 154 mg/dL — ABNORMAL HIGH (ref 70–99)
Glucose-Capillary: 156 mg/dL — ABNORMAL HIGH (ref 70–99)
Glucose-Capillary: 135 mg/dL — ABNORMAL HIGH (ref 70–99)
Glucose-Capillary: 152 mg/dL — ABNORMAL HIGH (ref 70–99)
Glucose-Capillary: 173 mg/dL — ABNORMAL HIGH (ref 70–99)
Glucose-Capillary: 211 mg/dL — ABNORMAL HIGH (ref 70–99)

## 2021-08-04 LAB — TROPONIN I (HIGH SENSITIVITY): Troponin I (High Sensitivity): 5 ng/L (ref <18)

## 2021-08-04 LAB — CBC
HCT: 37.9 % (ref 36.0–46.0)
Hemoglobin: 11.7 g/dL — ABNORMAL LOW (ref 12.0–15.0)
MCH: 27.6 pg (ref 26.0–34.0)
MCHC: 30.9 g/dL (ref 30.0–36.0)
MCV: 89.4 fL (ref 80.0–100.0)
Platelets: 250 10*3/uL (ref 150–400)
RBC: 4.24 MIL/uL (ref 3.87–5.11)
RDW: 13.3 % (ref 11.5–15.5)
WBC: 7 10*3/uL (ref 4.0–10.5)
nRBC: 0 % (ref 0.0–0.2)

## 2021-08-04 LAB — HIV ANTIBODY (ROUTINE TESTING W REFLEX): HIV Screen 4th Generation wRfx: NONREACTIVE

## 2021-08-04 LAB — RESP PANEL BY RT-PCR (FLU A&B, COVID) ARPGX2
Influenza A by PCR: NEGATIVE
Influenza B by PCR: NEGATIVE
SARS Coronavirus 2 by RT PCR: NEGATIVE

## 2021-08-04 LAB — COMPREHENSIVE METABOLIC PANEL
ALT: 16 U/L (ref 0–44)
AST: 23 U/L (ref 15–41)
Albumin: 3.5 g/dL (ref 3.5–5.0)
Alkaline Phosphatase: 61 U/L (ref 38–126)
Anion gap: 8 (ref 5–15)
BUN: 17 mg/dL (ref 8–23)
CO2: 22 mmol/L (ref 22–32)
Calcium: 10 mg/dL (ref 8.9–10.3)
Chloride: 106 mmol/L (ref 98–111)
Creatinine, Ser: 0.82 mg/dL (ref 0.44–1.00)
GFR, Estimated: >60 mL/min (ref >60)
Glucose, Bld: 146 mg/dL — ABNORMAL HIGH (ref 70–99)
Potassium: 4.2 mmol/L (ref 3.5–5.1)
Sodium: 136 mmol/L (ref 135–145)
Total Bilirubin: 0.7 mg/dL (ref 0.3–1.2)
Total Protein: 7.2 g/dL (ref 6.5–8.1)

## 2021-08-04 LAB — GLUCOSE, CAPILLARY
Glucose-Capillary: 241 mg/dL — ABNORMAL HIGH (ref 70–99)
Glucose-Capillary: 307 mg/dL — ABNORMAL HIGH (ref 70–99)
Glucose-Capillary: 313 mg/dL — ABNORMAL HIGH (ref 70–99)

## 2021-08-04 LAB — HEMOGLOBIN A1C
Hgb A1c MFr Bld: 8.4 % — ABNORMAL HIGH (ref 4.8–5.6)
Mean Plasma Glucose: 194.38 mg/dL

## 2021-08-04 MED ORDER — ACETAMINOPHEN 325 MG PO TABS
650.0000 mg | ORAL_TABLET | Freq: Four times a day (QID) | ORAL | Status: DC | PRN
  Administered 2021-08-04: 650 mg via ORAL
  Filled 2021-08-04 (×2): qty 2

## 2021-08-04 MED ORDER — ACETAMINOPHEN 650 MG RE SUPP: 650.0000 mg | Freq: Four times a day (QID) | RECTAL | Status: DC | PRN

## 2021-08-04 MED ORDER — DULOXETINE HCL 30 MG PO CPEP
30.0000 mg | ORAL_CAPSULE | Freq: Every day | ORAL | Status: DC
  Administered 2021-08-04 – 2021-08-09 (×6): 30 mg via ORAL
  Filled 2021-08-04 (×6): qty 1

## 2021-08-04 MED ORDER — GABAPENTIN 400 MG PO CAPS
400.0000 mg | ORAL_CAPSULE | Freq: Three times a day (TID) | ORAL | Status: DC
  Administered 2021-08-04 – 2021-08-06 (×7): 400 mg via ORAL
  Filled 2021-08-04 (×7): qty 1

## 2021-08-04 MED ORDER — HYDRALAZINE HCL 20 MG/ML IJ SOLN
10.0000 mg | INTRAMUSCULAR | Status: DC | PRN
  Filled 2021-08-04: qty 1

## 2021-08-04 MED ORDER — LOSARTAN POTASSIUM 50 MG PO TABS
100.0000 mg | ORAL_TABLET | Freq: Every day | ORAL | Status: DC
  Administered 2021-08-04 – 2021-08-06 (×3): 100 mg via ORAL
  Filled 2021-08-04 (×4): qty 2

## 2021-08-04 MED ORDER — ASPIRIN 81 MG PO CHEW
81.0000 mg | CHEWABLE_TABLET | Freq: Every day | ORAL | Status: DC
  Administered 2021-08-04 – 2021-08-09 (×6): 81 mg via ORAL
  Filled 2021-08-04 (×6): qty 1

## 2021-08-04 MED ORDER — ROPINIROLE HCL 0.25 MG PO TABS
0.2500 mg | ORAL_TABLET | Freq: Three times a day (TID) | ORAL | Status: DC
  Administered 2021-08-04 – 2021-08-09 (×17): 0.25 mg via ORAL
  Filled 2021-08-04 (×20): qty 1

## 2021-08-04 MED ORDER — CARVEDILOL 25 MG PO TABS
25.0000 mg | ORAL_TABLET | Freq: Two times a day (BID) | ORAL | Status: DC
  Administered 2021-08-04 – 2021-08-09 (×11): 25 mg via ORAL
  Filled 2021-08-04 (×5): qty 1
  Filled 2021-08-04: qty 2
  Filled 2021-08-04 (×5): qty 1

## 2021-08-04 MED ORDER — GABAPENTIN 300 MG PO CAPS
400.0000 mg | ORAL_CAPSULE | Freq: Once | ORAL | Status: AC
  Administered 2021-08-04: 400 mg via ORAL
  Filled 2021-08-04: qty 1

## 2021-08-04 MED ORDER — PREGABALIN 75 MG PO CAPS
75.0000 mg | ORAL_CAPSULE | Freq: Two times a day (BID) | ORAL | Status: DC
  Administered 2021-08-04 – 2021-08-06 (×6): 75 mg via ORAL
  Filled 2021-08-04 (×2): qty 1
  Filled 2021-08-04: qty 3
  Filled 2021-08-04: qty 1
  Filled 2021-08-04: qty 3
  Filled 2021-08-04: qty 1

## 2021-08-04 MED ORDER — CYCLOSPORINE 0.05 % OP EMUL
1.0000 [drp] | Freq: Two times a day (BID) | OPHTHALMIC | Status: DC
  Administered 2021-08-05 – 2021-08-09 (×10): 1 [drp] via OPHTHALMIC
  Filled 2021-08-04 (×11): qty 30

## 2021-08-04 MED ORDER — METHOCARBAMOL 500 MG PO TABS
500.0000 mg | ORAL_TABLET | Freq: Three times a day (TID) | ORAL | Status: DC | PRN
  Administered 2021-08-04 – 2021-08-09 (×5): 500 mg via ORAL
  Filled 2021-08-04 (×5): qty 1

## 2021-08-04 MED ORDER — METOPROLOL TARTRATE 25 MG PO TABS: 50.0000 mg | ORAL_TABLET | Freq: Two times a day (BID) | ORAL | Status: DC

## 2021-08-04 MED ORDER — VITAMIN B-12 1000 MCG PO TABS
1000.0000 ug | ORAL_TABLET | Freq: Every evening | ORAL | Status: DC
  Administered 2021-08-04 – 2021-08-08 (×5): 1000 ug via ORAL
  Filled 2021-08-04 (×5): qty 1

## 2021-08-04 MED ORDER — FOLIC ACID 1 MG PO TABS
1.0000 mg | ORAL_TABLET | Freq: Every evening | ORAL | Status: DC
  Administered 2021-08-04 – 2021-08-08 (×5): 1 mg via ORAL
  Filled 2021-08-04 (×5): qty 1

## 2021-08-04 MED ORDER — AMLODIPINE BESYLATE 10 MG PO TABS
10.0000 mg | ORAL_TABLET | Freq: Every day | ORAL | Status: DC
  Administered 2021-08-04 – 2021-08-09 (×6): 10 mg via ORAL
  Filled 2021-08-04: qty 1
  Filled 2021-08-04: qty 2
  Filled 2021-08-04 (×4): qty 1

## 2021-08-04 MED ORDER — ALPRAZOLAM 0.25 MG PO TABS
0.2500 mg | ORAL_TABLET | Freq: Three times a day (TID) | ORAL | Status: DC | PRN
  Administered 2021-08-05 – 2021-08-08 (×4): 0.5 mg via ORAL
  Filled 2021-08-04 (×4): qty 2

## 2021-08-04 MED ORDER — ALBUTEROL SULFATE (2.5 MG/3ML) 0.083% IN NEBU: 2.5000 mg | INHALATION_SOLUTION | RESPIRATORY_TRACT | Status: DC | PRN

## 2021-08-04 MED ORDER — ENOXAPARIN SODIUM 40 MG/0.4ML IJ SOSY
40.0000 mg | PREFILLED_SYRINGE | INTRAMUSCULAR | Status: DC
  Administered 2021-08-04 – 2021-08-09 (×6): 40 mg via SUBCUTANEOUS
  Filled 2021-08-04 (×5): qty 0.4

## 2021-08-04 MED ORDER — ROSUVASTATIN CALCIUM 5 MG PO TABS
5.0000 mg | ORAL_TABLET | Freq: Every day | ORAL | Status: DC
  Administered 2021-08-04 – 2021-08-08 (×6): 5 mg via ORAL
  Filled 2021-08-04 (×6): qty 1

## 2021-08-04 MED ORDER — SODIUM CHLORIDE 0.9 % IV SOLN
2.0000 g | INTRAVENOUS | Status: AC
  Administered 2021-08-05 – 2021-08-08 (×5): 2 g via INTRAVENOUS
  Filled 2021-08-04 (×5): qty 20

## 2021-08-04 MED ORDER — EXEMESTANE 25 MG PO TABS
25.0000 mg | ORAL_TABLET | Freq: Every day | ORAL | Status: DC
  Administered 2021-08-04 – 2021-08-05 (×2): 25 mg via ORAL
  Filled 2021-08-04 (×3): qty 1

## 2021-08-04 MED ORDER — SODIUM CHLORIDE 0.9 % IV SOLN
500.0000 mg | INTRAVENOUS | Status: DC
  Administered 2021-08-05 (×2): 500 mg via INTRAVENOUS
  Filled 2021-08-04 (×2): qty 500

## 2021-08-04 NOTE — Telephone Encounter (Signed)
Spoke with pt's daughter via telephone regarding her infusion appt today.  Pt's daughter stated the pt was sent to the ED d/t BG 26 and non responsive.  Pt has been admitted to [facility] on [address].  Notified infusion and Dr. Burr Medico of the pt's admission.

## 2021-08-04 NOTE — Plan of Care (Signed)
  Problem: Clinical Measurements: Goal: Respiratory complications will improve Outcome: Progressing   Problem: Nutrition: Goal: Adequate nutrition will be maintained Outcome: Progressing   

## 2021-08-04 NOTE — Progress Notes (Signed)
NEW ADMISSION NOTE New Admission Note:   Arrival Method:  Stretcher Mental Orientation:  A&O x 4 Telemetry: box 6 Assessment: Completed Skin: intact IV: Righjt fore arm and AC Pain: 2 1-10 scale Tubes: none Safety Measures: Safety Fall Prevention Plan has been given, discussed and signed Admission: Completed [facility] Orientation: Patient has been orientated to the room, unit and staff.  Family: Daughter present  Orders have been reviewed and implemented. Will continue to monitor the patient. Call light has been placed within reach and bed alarm has been activated.   Berneta Levins, RN

## 2021-08-04 NOTE — Progress Notes (Signed)
PROGRESS NOTE        PATIENT DETAILS Name: Cheryl Blankenship Age: 70 y.o. Sex: female Date of Birth: Sep 17, 1951 Admit Date: 08/03/2021 Admitting Physician Rise Patience, MD PIR:JJOAC, Rachel Moulds, NP  Brief Narrative: Patient is a 70 y.o. female with history of DM-2, HFpEF, HTN, breast cancer, Parkinson's disease-admitted for altered mental status due to hypoglycemia.  Subjective: Lying comfortably in bed-denies any chest pain or shortness of breath.  Objective: Vitals: Blood pressure 117/88, pulse 62, temperature 98.1 F (36.7 C), temperature source Oral, resp. rate 16, height 5\' 3"  (1.6 m), weight 97.1 kg, SpO2 97 %.   Exam: Gen Exam:Alert awake-not in any distress HEENT:atraumatic, normocephalic Chest: B/L clear to auscultation anteriorly CVS:S1S2 regular Abdomen:soft non tender, non distended Extremities:no edema Neurology: Non focal Skin: no rash  Pertinent Labs/Radiology: Recent Labs  Lab 08/03/21 2124 08/04/21 0256  WBC 6.2 7.0  HGB 12.1 11.7*  PLT 240 250  NA 142 136  K 4.6 4.2  CREATININE 0.92 0.82  AST 18 23  ALT 14 16  ALKPHOS 62 61  BILITOT 0.6 0.7    11/16>>Blood culture: No growth  11/15>>CXR: Patchy airspace disease in the right lower lung area.  Assessment/Plan: Hypoglycemia: Due to patient starting intermittent fasting and still taking her usual dosing of Lantus/NovoLog and Glucotrol XL.  Sugars have now stabilized-have encouraged her to start consuming a regular diet-if sugars continue to increase-we will restart her insulin regimen but at a lower dose.  Follow closely.  Recent Labs    08/04/21 0725 08/04/21 0914 08/04/21 1047  GLUCAP 154* 156* 135*    DM-2 (A1c 8.4): All insulin products/oral hypoglycemics on hold-allow sugars to rise further before resuming-see above.  Right lower lobe PNA: Probably aspiration pneumonitis in the setting of hypoglycemia/altered mental status.  Continue empiric  antibiotics-reassess on 11/17 if we can switch to oral regimen.  HFpEF: Compensated-resume diuretics once blood pressure improves a bit more.  HTN: BP creeping up-continue Coreg/amlodipine/losartan  HLD: Continue statin  Parkinson's disease: Stable-resume Requip  History of breast cancer-s/p left mastectomy: Follow-up with oncology as previous on Aromasin.  Peripheral neuropathy: Continue Neurontin/Lyrica/Cymbalta  Obesity Estimated body mass index is 37.91 kg/m as calculated from the following:   Height as of this encounter: 5\' 3"  (1.6 m).   Weight as of this encounter: 97.1 kg.     Procedures: None Consults: None DVT Prophylaxis: Lovenox Code Status:Full code  Family Communication: Daughter at bedside  Time spent: 76 minutes-Greater than 50% of this time was spent in counseling, explanation of diagnosis, planning of further management, and coordination of care.  Disposition Plan: Status is: Observation  The patient remains OBS appropriate and will d/c before 2 midnights.  Continue CBG monitoring for 1 more day-is on long-acting Glucotrol that might still be in her system-plan is to resume her insulin regimen and watch closely-if CBGs remain stable-we will go home on 11/17.   Diet: Diet Order             Diet heart healthy/carb modified Room service appropriate? Yes; Fluid consistency: Thin; Fluid restriction: 1200 mL Fluid  Diet effective now                     Antimicrobial agents: Anti-infectives (From admission, onward)    Start     Dose/Rate Route Frequency Ordered Stop   08/04/21  2300  azithromycin (ZITHROMAX) 500 mg in sodium chloride 0.9 % 250 mL IVPB        500 mg 250 mL/hr over 60 Minutes Intravenous Every 24 hours 08/04/21 0156 08/09/21 2259   08/04/21 2200  cefTRIAXone (ROCEPHIN) 2 g in sodium chloride 0.9 % 100 mL IVPB        2 g 200 mL/hr over 30 Minutes Intravenous Every 24 hours 08/04/21 0156 08/09/21 2159   08/03/21 2315  cefTRIAXone  (ROCEPHIN) 1 g in sodium chloride 0.9 % 100 mL IVPB        1 g 200 mL/hr over 30 Minutes Intravenous  Once 08/03/21 2310 08/04/21 0125   08/03/21 2315  azithromycin (ZITHROMAX) 500 mg in sodium chloride 0.9 % 250 mL IVPB        500 mg 250 mL/hr over 60 Minutes Intravenous  Once 08/03/21 2310 08/04/21 0222        MEDICATIONS: Scheduled Meds:  amLODipine  10 mg Oral Daily   aspirin  81 mg Oral Daily   carvedilol  25 mg Oral BID WC   DULoxetine  30 mg Oral Daily   enoxaparin (LOVENOX) injection  40 mg Subcutaneous Q24H   exemestane  25 mg Oral QHS   folic acid  1 mg Oral QPM   gabapentin  400 mg Oral TID   losartan  100 mg Oral Daily   pregabalin  75 mg Oral BID   rOPINIRole  0.25 mg Oral TID   rosuvastatin  5 mg Oral QHS   sodium chloride flush  3 mL Intravenous Q12H   vitamin B-12  1,000 mcg Oral QPM   Continuous Infusions:  sodium chloride     azithromycin     cefTRIAXone (ROCEPHIN)  IV     PRN Meds:.sodium chloride, acetaminophen **OR** acetaminophen, albuterol, ALPRAZolam, hydrALAZINE, methocarbamol   I have personally reviewed following labs and imaging studies  LABORATORY DATA: CBC: Recent Labs  Lab 08/03/21 2124 08/04/21 0256  WBC 6.2 7.0  NEUTROABS 4.4  --   HGB 12.1 11.7*  HCT 38.5 37.9  MCV 90.0 89.4  PLT 240 169    Basic Metabolic Panel: Recent Labs  Lab 08/03/21 2124 08/04/21 0256  NA 142 136  K 4.6 4.2  CL 108 106  CO2 26 22  GLUCOSE 71 146*  BUN 17 17  CREATININE 0.92 0.82  CALCIUM 10.7* 10.0    GFR: Estimated Creatinine Clearance: 73.9 mL/min (by C-G formula based on SCr of 0.82 mg/dL).  Liver Function Tests: Recent Labs  Lab 08/03/21 2124 08/04/21 0256  AST 18 23  ALT 14 16  ALKPHOS 62 61  BILITOT 0.6 0.7  PROT 7.5 7.2  ALBUMIN 4.0 3.5   No results for input(s): LIPASE, AMYLASE in the last 168 hours. No results for input(s): AMMONIA in the last 168 hours.  Coagulation Profile: No results for input(s): INR, PROTIME in  the last 168 hours.  Cardiac Enzymes: No results for input(s): CKTOTAL, CKMB, CKMBINDEX, TROPONINI in the last 168 hours.  BNP (last 3 results) No results for input(s): PROBNP in the last 8760 hours.  Lipid Profile: No results for input(s): CHOL, HDL, LDLCALC, TRIG, CHOLHDL, LDLDIRECT in the last 72 hours.  Thyroid Function Tests: No results for input(s): TSH, T4TOTAL, FREET4, T3FREE, THYROIDAB in the last 72 hours.  Anemia Panel: No results for input(s): VITAMINB12, FOLATE, FERRITIN, TIBC, IRON, RETICCTPCT in the last 72 hours.  Urine analysis:    Component Value Date/Time   COLORURINE YELLOW 08/03/2021  2137   APPEARANCEUR HAZY (A) 08/03/2021 2137   LABSPEC 1.012 08/03/2021 2137   PHURINE 7.0 08/03/2021 2137   GLUCOSEU 50 (A) 08/03/2021 2137   HGBUR NEGATIVE 08/03/2021 2137   BILIRUBINUR NEGATIVE 08/03/2021 2137   [place] NEGATIVE 08/03/2021 2137   PROTEINUR NEGATIVE 08/03/2021 2137   NITRITE NEGATIVE 08/03/2021 2137   LEUKOCYTESUR NEGATIVE 08/03/2021 2137    Sepsis Labs: Lactic Acid, Venous No results found for: LATICACIDVEN  MICROBIOLOGY: Recent Results (from the past 240 hour(s))  Blood culture (routine x 2)     Status: None (Preliminary result)   Collection Time: 08/04/21 12:50 AM   Specimen: BLOOD LEFT HAND  Result Value Ref Range Status   Specimen Description BLOOD LEFT HAND  Final   Special Requests   Final    BOTTLES DRAWN AEROBIC AND ANAEROBIC Blood Culture adequate volume   Culture   Final    NO GROWTH < 12 HOURS Performed at [facility], [place] [address] [place], [place] [address]    Report Status PENDING  Incomplete  Resp Panel by RT-PCR (Flu A&B, Covid) Nasopharyngeal Swab     Status: None   Collection Time: 08/04/21 12:56 AM   Specimen: Nasopharyngeal Swab; Nasopharyngeal(NP) swabs in vial transport medium  Result Value Ref Range Status   SARS Coronavirus 2 by RT PCR NEGATIVE NEGATIVE Final    Comment: (NOTE) SARS-CoV-2 target nucleic  acids are NOT DETECTED.  The SARS-CoV-2 RNA is generally detectable in upper respiratory specimens during the acute phase of infection. The lowest concentration of SARS-CoV-2 viral copies this assay can detect is 138 copies/mL. A negative result does not preclude SARS-Cov-2 infection and should not be used as the sole basis for treatment or other patient management decisions. A negative result may occur with  improper specimen collection/handling, submission of specimen other than nasopharyngeal swab, presence of viral mutation(s) within the areas targeted by this assay, and inadequate number of viral copies(<138 copies/mL). A negative result must be combined with clinical observations, patient history, and epidemiological information. The expected result is Negative.  Fact Sheet for Patients:  EntrepreneurPulse.com.au  Fact Sheet for Healthcare Providers:  IncredibleEmployment.be  This test is no t yet approved or cleared by the [place] FDA and  has been authorized for detection and/or diagnosis of SARS-CoV-2 by FDA under an Emergency Use Authorization (EUA). This EUA will remain  in effect (meaning this test can be used) for the duration of the COVID-19 declaration under Section 564(b)(1) of the Act, 21 U.S.C.section 360bbb-3(b)(1), unless the authorization is terminated  or revoked sooner.       Influenza A by PCR NEGATIVE NEGATIVE Final   Influenza B by PCR NEGATIVE NEGATIVE Final    Comment: (NOTE) The Xpert Xpress SARS-CoV-2/FLU/RSV plus assay is intended as an aid in the diagnosis of influenza from Nasopharyngeal swab specimens and should not be used as a sole basis for treatment. Nasal washings and aspirates are unacceptable for Xpert Xpress SARS-CoV-2/FLU/RSV testing.  Fact Sheet for Patients: EntrepreneurPulse.com.au  Fact Sheet for Healthcare Providers: IncredibleEmployment.be  This  test is not yet approved or cleared by the [place] FDA and has been authorized for detection and/or diagnosis of SARS-CoV-2 by FDA under an Emergency Use Authorization (EUA). This EUA will remain in effect (meaning this test can be used) for the duration of the COVID-19 declaration under Section 564(b)(1) of the Act, 21 U.S.C. section 360bbb-3(b)(1), unless the authorization is terminated or revoked.  Performed at [facility], [address] [place], [place] [address]   Blood culture (routine x 2)     Status: None (Preliminary result)   Collection Time: 08/04/21  1:23 AM   Specimen: BLOOD RIGHT HAND  Result Value Ref Range Status   Specimen Description BLOOD RIGHT HAND  Final   Special Requests   Final    BOTTLES DRAWN AEROBIC AND ANAEROBIC Blood Culture adequate volume   Culture   Final    NO GROWTH < 12 HOURS Performed at [facility], [place] [address], [place], [place] [address]    Report Status PENDING  Incomplete    RADIOLOGY STUDIES/RESULTS: DG Chest Port 1 View  Result Date: 08/03/2021 CLINICAL DATA:  Back pain. EXAM: PORTABLE CHEST 1 VIEW COMPARISON:  No prior exams available FINDINGS: Right chest port with tip in the lower SVC. Upper normal heart size/mild cardiomegaly. Normal mediastinal contours. Patchy airspace disease in the right mid lower lung zone. Mild vascular congestion without pulmonary edema. There may be a small right pleural effusion. No pneumothorax. On limited evaluation, no acute osseous abnormalities are seen. Endplate spurring is seen in the thoracic spine, though not well assessed on this portable AP view. Surgical clips in the left axilla. IMPRESSION: 1. Patchy airspace disease in the right mid lower lung zone, suspicious for pneumonia. Possible small right pleural effusion. 2. Upper normal heart size/mild cardiomegaly. Mild vascular congestion without pulmonary edema. Electronically Signed   By: Keith Rake M.D.   On: 08/03/2021 22:55      LOS: 0 days   Oren Binet, MD  [facility]    To contact the attending provider between 7A-7P or the covering provider during after hours 7P-7A, please log into the web site [email or web address] and access using universal [place] password for that web site. If you do not have the password, please call the hospital operator.  08/04/2021, 11:02 AM

## 2021-08-04 NOTE — ED Notes (Addendum)
Pt reports having muscle spasms in lower back - PRN meds to be given

## 2021-08-04 NOTE — ED Notes (Signed)
Pt oral temp 97.6 - MD Hal Hope notified - pt taken off of bair hugger at this time

## 2021-08-04 NOTE — H&P (Addendum)
History and Physical    Cheryl Blankenship JOI:[id number] DOB: 15-Nov-1950 DOA: 08/03/2021  PCP: No primary care provider on file.  Patient coming from: Home.  Chief Complaint: Altered mental status and low blood sugar.  HPI: Cheryl Blankenship is a 70 y.o. female with history of diabetes mellitus type 2 on Lantus, diastolic CHF, hypertension, breast cancer, Parkinson's disease was brought to the ER after patient's family found patient to be unresponsive.  Early and has a history patient became lethargic while shopping at [facility] patient was found to have blood sugar in the 50s was given D50 and discharged home by the EMS.  Patient states over the last 3 days she has not been eating well because she was fasting.  Only was taking peanut butter.  But she was taking her regular dose of Lantus insulin.  Also takes glipizide.  About a month ago patient was given antibiotics for respiratory tract infection which she took for a week.  ED Course: In the ER patient was found to be hypoglycemic in the 60s and also hypothermic with temperature in the 91 F.  Was placed on warming blankets and chest x-ray shows features concerning for pneumonia.  Blood cultures were obtained started on empiric antibiotics.  Patient has been eating now at this time we are closely monitoring the CBGs.  EKG shows normal sinus rhythm.  Patient is following commands and moving all extremities.  Admitted for further work-up of hypoglycemia with hypothermia and pneumonia.  COVID test is negative.  Review of Systems: As per HPI, rest all negative.   Past Medical History:  Diagnosis Date   Cancer ([place])    Congestive heart failure (CHF) (HCC)    Diabetes mellitus without complication (HCC)    High cholesterol    Hypertension     Past Surgical History:  Procedure Laterality Date   BREAST SURGERY       reports that she has never smoked. She has never used smokeless tobacco. She reports that she does not drink alcohol and does not use  drugs.  Allergies  Allergen Reactions   Coconut (Cocos Nucifera) Allergy Skin Test    Hydrochlorothiazide Swelling   Other     Pt is allergic to a pain medication. Unsure of the name   Lisinopril Rash    Family History  Family history unknown: Yes    Prior to Admission medications   Medication Sig Start Date End Date Taking? Authorizing Provider  acetaminophen (TYLENOL) 500 MG tablet Take 500 mg by mouth every 6 (six) hours as needed for moderate pain or headache.   Yes [provider]  albuterol (VENTOLIN HFA) 108 (90 Base) MCG/ACT inhaler Inhale 1 puff into the lungs every 4 (four) hours as needed for wheezing or shortness of breath.   Yes [provider]  ALPRAZolam (XANAX) 0.25 MG tablet Take 0.25-0.5 mg by mouth 3 (three) times daily as needed for anxiety.   Yes [provider]  amLODipine (NORVASC) 10 MG tablet Take 10 mg by mouth daily.   Yes [provider]  aspirin 81 MG chewable tablet Chew 81 mg by mouth daily.   Yes [provider]  carvedilol (COREG) 25 MG tablet Take 25 mg by mouth 2 (two) times daily with a meal.   Yes [provider]  cholecalciferol (VITAMIN D) 25 MCG (1000 UNIT) tablet Take 1,000 Units by mouth at bedtime.   Yes [provider]  cycloSPORINE (RESTASIS) 0.05 % ophthalmic emulsion Place 1 drop into both  eyes 2 (two) times daily.   Yes [provider]  diphenoxylate-atropine (LOMOTIL) 2.5-0.025 MG tablet Take 1-2 tablets by mouth 4 (four) times daily as needed for diarrhea or loose stools.   Yes [provider]  DULoxetine (CYMBALTA) 30 MG capsule Take 30 mg by mouth daily.   Yes [provider]  exemestane (AROMASIN) 25 MG tablet Take 25 mg by mouth daily after breakfast.   Yes [provider]  folic acid (FOLVITE) 1 MG tablet Take 1 mg by mouth every evening.   Yes [provider]  furosemide (LASIX) 20 MG tablet Take 20 mg by mouth daily as needed  (swelling/weight gain).   Yes [provider]  gabapentin (NEURONTIN) 400 MG capsule Take 400 mg by mouth 3 (three) times daily.   Yes [provider]  glipiZIDE (GLUCOTROL XL) 10 MG 24 hr tablet Take 10 mg by mouth daily with breakfast.   Yes [provider]  Insulin Aspart (NOVOLOG FLEXPEN [place]) Inject 0-10 Units into the skin See admin instructions. 0-150 = 0 units 150-199 = 2 units 200-249 = 4 units 250-299 = 6 units 300-349 = 8 units Greater than 350 = 10 units   Yes [provider]  insulin glargine (LANTUS) 100 UNIT/ML injection Inject 70 Units into the skin 2 (two) times daily.   Yes [provider]  lactulose (CHRONULAC) 10 GM/15ML solution Take 30 g by mouth daily.   Yes [provider]  losartan (COZAAR) 100 MG tablet Take 100 mg by mouth daily.   Yes [provider]  methocarbamol (ROBAXIN) 500 MG tablet Take 500 mg by mouth every 8 (eight) hours as needed for muscle spasms.   Yes [provider]  metoprolol tartrate (LOPRESSOR) 50 MG tablet Take 50 mg by mouth 2 (two) times daily.   Yes [provider]  omeprazole (PRILOSEC) 20 MG capsule Take 20 mg by mouth daily.   Yes [provider]  ondansetron (ZOFRAN-ODT) 4 MG disintegrating tablet Take 4 mg by mouth 2 (two) times daily as needed for nausea or vomiting.   Yes [provider]  potassium chloride (MICRO-K) 10 MEQ CR capsule Take 10 mEq by mouth daily.   Yes [provider]  pregabalin (LYRICA) 75 MG capsule Take 75 mg by mouth 2 (two) times daily.   Yes [provider]  promethazine (PHENERGAN) 25 MG suppository Place 25 mg rectally every 8 (eight) hours as needed for nausea or vomiting.   Yes [provider]  rOPINIRole (REQUIP) 0.25 MG tablet Take 0.25 mg by mouth 3 (three) times daily.   Yes [provider]  rosuvastatin (CRESTOR) 5 MG tablet Take 5 mg by mouth at bedtime.   Yes [provider]  spironolactone (ALDACTONE) 25 MG tablet Take 25 mg by mouth daily.   Yes [provider]  torsemide (DEMADEX) 20 MG tablet Take 40 mg by mouth daily.   Yes [provider]  vitamin B-12 (CYANOCOBALAMIN) 1000 MCG tablet Take 1,000 mcg by mouth every evening.   Yes [provider]  zolpidem (AMBIEN) 5 MG tablet Take 5 mg by mouth at bedtime.   Yes [provider]    Physical Exam: Constitutional: Moderately built and nourished. Vitals:   08/03/21 2134 08/03/21 2145 08/03/21 2200 08/04/21 0015  BP:  (!) 186/107 (!) 181/88 (!) 153/83  Pulse:  (!) 59 (!) 54 (!) 52  Resp:  13 14 15   Temp:      TempSrc:  SpO2:  96% 98% 95%  Weight: 97.1 kg     Height: 5\' 3"  (1.6 m)      Eyes: Anicteric no pallor. ENMT: No discharge from the ears eyes nose and mouth. Neck: No mass felt.  No neck rigidity. Respiratory: No rhonchi or crepitations. Cardiovascular: S1-S2 heard. Abdomen: Soft nontender bowel sound present. Musculoskeletal: No edema. Skin: Left mastectomy site looks clean. Neurologic: Alert awake oriented time place and person.  Moves all extremities. Psychiatric: Appears normal.  Normal affect.   Labs on Admission: I have personally reviewed following labs and imaging studies  CBC: Recent Labs  Lab 08/03/21 2124  WBC 6.2  NEUTROABS 4.4  HGB 12.1  HCT 38.5  MCV 90.0  PLT 497   Basic Metabolic Panel: Recent Labs  Lab 08/03/21 2124  NA 142  K 4.6  CL 108  CO2 26  GLUCOSE 71  BUN 17  CREATININE 0.92  CALCIUM 10.7*   GFR: Estimated Creatinine Clearance: 65.9 mL/min (by C-G formula based on SCr of 0.92 mg/dL). Liver Function Tests: Recent Labs  Lab 08/03/21 2124  AST 18  ALT 14  ALKPHOS 62  BILITOT 0.6  PROT 7.5  ALBUMIN 4.0   No results for input(s): LIPASE, AMYLASE in the last 168 hours. No results for input(s): AMMONIA in the last 168 hours. Coagulation Profile: No results for input(s): INR, PROTIME in  the last 168 hours. Cardiac Enzymes: No results for input(s): CKTOTAL, CKMB, CKMBINDEX, TROPONINI in the last 168 hours. BNP (last 3 results) No results for input(s): PROBNP in the last 8760 hours. HbA1C: No results for input(s): HGBA1C in the last 72 hours. CBG: Recent Labs  Lab 08/03/21 2120 08/03/21 2203 08/03/21 2311 08/04/21 0100  GLUCAP 63* 139* 104* 46*   Lipid Profile: No results for input(s): CHOL, HDL, LDLCALC, TRIG, CHOLHDL, LDLDIRECT in the last 72 hours. Thyroid Function Tests: No results for input(s): TSH, T4TOTAL, FREET4, T3FREE, THYROIDAB in the last 72 hours. Anemia Panel: No results for input(s): VITAMINB12, FOLATE, FERRITIN, TIBC, IRON, RETICCTPCT in the last 72 hours. Urine analysis:    Component Value Date/Time   COLORURINE YELLOW 08/03/2021 2137   APPEARANCEUR HAZY (A) 08/03/2021 2137   LABSPEC 1.012 08/03/2021 2137   PHURINE 7.0 08/03/2021 2137   GLUCOSEU 50 (A) 08/03/2021 2137   HGBUR NEGATIVE 08/03/2021 2137   BILIRUBINUR NEGATIVE 08/03/2021 2137   [place] NEGATIVE 08/03/2021 2137   PROTEINUR NEGATIVE 08/03/2021 2137   NITRITE NEGATIVE 08/03/2021 2137   LEUKOCYTESUR NEGATIVE 08/03/2021 2137   Sepsis Labs: @LABRCNTIP (procalcitonin:4,lacticidven:4) ) Recent Results (from the past 240 hour(s))  Resp Panel by RT-PCR (Flu A&B, Covid) Nasopharyngeal Swab     Status: None   Collection Time: 08/04/21 12:56 AM   Specimen: Nasopharyngeal Swab; Nasopharyngeal(NP) swabs in vial transport medium  Result Value Ref Range Status   SARS Coronavirus 2 by RT PCR NEGATIVE NEGATIVE Final    Comment: (NOTE) SARS-CoV-2 target nucleic acids are NOT DETECTED.  The SARS-CoV-2 RNA is generally detectable in upper respiratory specimens during the acute phase of infection. The lowest concentration of SARS-CoV-2 viral copies this assay can detect is 138 copies/mL. A negative result does not preclude SARS-Cov-2 infection and should not be used as the sole basis for  treatment or other patient management decisions. A negative result may occur with  improper specimen collection/handling, submission of specimen other than nasopharyngeal swab, presence of viral mutation(s) within the areas targeted by this assay, and inadequate number of viral copies(<138 copies/mL). A negative result  must be combined with clinical observations, patient history, and epidemiological information. The expected result is Negative.  Fact Sheet for Patients:  EntrepreneurPulse.com.au  Fact Sheet for Healthcare Providers:  IncredibleEmployment.be  This test is no t yet approved or cleared by the [place] FDA and  has been authorized for detection and/or diagnosis of SARS-CoV-2 by FDA under an Emergency Use Authorization (EUA). This EUA will remain  in effect (meaning this test can be used) for the duration of the COVID-19 declaration under Section 564(b)(1) of the Act, 21 U.S.C.section 360bbb-3(b)(1), unless the authorization is terminated  or revoked sooner.       Influenza A by PCR NEGATIVE NEGATIVE Final   Influenza B by PCR NEGATIVE NEGATIVE Final    Comment: (NOTE) The Xpert Xpress SARS-CoV-2/FLU/RSV plus assay is intended as an aid in the diagnosis of influenza from Nasopharyngeal swab specimens and should not be used as a sole basis for treatment. Nasal washings and aspirates are unacceptable for Xpert Xpress SARS-CoV-2/FLU/RSV testing.  Fact Sheet for Patients: EntrepreneurPulse.com.au  Fact Sheet for Healthcare Providers: IncredibleEmployment.be  This test is not yet approved or cleared by the [place] FDA and has been authorized for detection and/or diagnosis of SARS-CoV-2 by FDA under an Emergency Use Authorization (EUA). This EUA will remain in effect (meaning this test can be used) for the duration of the COVID-19 declaration under Section 564(b)(1) of the Act, 21  U.S.C. section 360bbb-3(b)(1), unless the authorization is terminated or revoked.  Performed at [facility], [place] [address] [place], [place] [address]      Radiological Exams on Admission: DG Chest Port 1 View  Result Date: 08/03/2021 CLINICAL DATA:  Back pain. EXAM: PORTABLE CHEST 1 VIEW COMPARISON:  No prior exams available FINDINGS: Right chest port with tip in the lower SVC. Upper normal heart size/mild cardiomegaly. Normal mediastinal contours. Patchy airspace disease in the right mid lower lung zone. Mild vascular congestion without pulmonary edema. There may be a small right pleural effusion. No pneumothorax. On limited evaluation, no acute osseous abnormalities are seen. Endplate spurring is seen in the thoracic spine, though not well assessed on this portable AP view. Surgical clips in the left axilla. IMPRESSION: 1. Patchy airspace disease in the right mid lower lung zone, suspicious for pneumonia. Possible small right pleural effusion. 2. Upper normal heart size/mild cardiomegaly. Mild vascular congestion without pulmonary edema. Electronically Signed   By: Keith Rake M.D.   On: 08/03/2021 22:55    EKG: Independently reviewed.  Normal sinus rhythm.  Assessment/Plan Principal Problem:   Hypoglycemia Active Problems:   Hypothermia   CAP (community acquired pneumonia)   Chronic diastolic CHF (congestive heart failure) (HCC)   Parkinson disease (HCC)   Breast cancer (HCC)    Hypoglycemia -likely because patient has not been eating well for the last 3 days as patient states she was fasting and already taking peanut butter.  But she was taking her regular dose of Lantus and glipizide.  At this time we will hold off insulin glipizide check hemoglobin A1c start diet and if blood sugar does not improve may need D5 or D10 infusion. Hypokalemia likely from hypoglycemia but also has possibility of pneumonia.  Blood cultures have been obtained we will check TSH cortisol  level.  Correct hypoglycemia treat pneumonia. Possible pneumonia on empiric antibiotics for now.  Follow cultures. Chronic diastolic CHF last EF measured last month was 60 to 65%.  Grade 1 diastolic dysfunction.  Holding torsemide and spironolactone for  now. Uncontrolled hypertension we will continue patient's home dose of amlodipine carvedilol ARB and will keep patient.  IV hydralazine. History of Parkinson's disease recently diagnosed and has been started on Requip. Breast cancer status post left-sided mastectomy.  Mastectomy site does not show any signs of infection.  Being followed by oncologist. Mild hypercalcemia appears to be chronic.  Further work-up as outpatient.   DVT prophylaxis: Lovenox. Code Status: Full code. Family Communication: Patient's daughter. Disposition Plan: Home. Consults called: None. Admission status: Observation.   Rise Patience MD [facility] Pager [phone].  If 7PM-7AM, please contact night-coverage [email or web address] Password [email or web address]  08/04/2021, 1:57 AM

## 2021-08-05 ENCOUNTER — Observation Stay (HOSPITAL_COMMUNITY): Payer: Medicare HMO

## 2021-08-05 DIAGNOSIS — M109 Gout, unspecified: Secondary | ICD-10-CM | POA: Diagnosis present

## 2021-08-05 DIAGNOSIS — M11261 Other chondrocalcinosis, right knee: Secondary | ICD-10-CM | POA: Diagnosis present

## 2021-08-05 DIAGNOSIS — Z6837 Body mass index (BMI) 37.0-37.9, adult: Secondary | ICD-10-CM | POA: Diagnosis not present

## 2021-08-05 DIAGNOSIS — N179 Acute kidney failure, unspecified: Secondary | ICD-10-CM | POA: Diagnosis present

## 2021-08-05 DIAGNOSIS — I110 Hypertensive heart disease with heart failure: Secondary | ICD-10-CM | POA: Diagnosis present

## 2021-08-05 DIAGNOSIS — Z853 Personal history of malignant neoplasm of breast: Secondary | ICD-10-CM | POA: Diagnosis not present

## 2021-08-05 DIAGNOSIS — M25562 Pain in left knee: Secondary | ICD-10-CM | POA: Diagnosis not present

## 2021-08-05 DIAGNOSIS — Z7982 Long term (current) use of aspirin: Secondary | ICD-10-CM | POA: Diagnosis not present

## 2021-08-05 DIAGNOSIS — Z888 Allergy status to other drugs, medicaments and biological substances status: Secondary | ICD-10-CM | POA: Diagnosis not present

## 2021-08-05 DIAGNOSIS — X58XXXA Exposure to other specified factors, initial encounter: Secondary | ICD-10-CM | POA: Diagnosis present

## 2021-08-05 DIAGNOSIS — G20 Parkinson's disease: Secondary | ICD-10-CM | POA: Diagnosis not present

## 2021-08-05 DIAGNOSIS — Z6841 Body Mass Index (BMI) 40.0 and over, adult: Secondary | ICD-10-CM | POA: Diagnosis not present

## 2021-08-05 DIAGNOSIS — R52 Pain, unspecified: Secondary | ICD-10-CM | POA: Diagnosis not present

## 2021-08-05 DIAGNOSIS — E119 Type 2 diabetes mellitus without complications: Secondary | ICD-10-CM

## 2021-08-05 DIAGNOSIS — I10 Essential (primary) hypertension: Secondary | ICD-10-CM

## 2021-08-05 DIAGNOSIS — M7989 Other specified soft tissue disorders: Secondary | ICD-10-CM | POA: Diagnosis not present

## 2021-08-05 DIAGNOSIS — M1711 Unilateral primary osteoarthritis, right knee: Secondary | ICD-10-CM | POA: Diagnosis present

## 2021-08-05 DIAGNOSIS — E1165 Type 2 diabetes mellitus with hyperglycemia: Secondary | ICD-10-CM | POA: Diagnosis not present

## 2021-08-05 DIAGNOSIS — C50912 Malignant neoplasm of unspecified site of left female breast: Secondary | ICD-10-CM

## 2021-08-05 DIAGNOSIS — M25561 Pain in right knee: Secondary | ICD-10-CM

## 2021-08-05 DIAGNOSIS — E1142 Type 2 diabetes mellitus with diabetic polyneuropathy: Secondary | ICD-10-CM | POA: Diagnosis present

## 2021-08-05 DIAGNOSIS — E669 Obesity, unspecified: Secondary | ICD-10-CM | POA: Diagnosis present

## 2021-08-05 DIAGNOSIS — E11649 Type 2 diabetes mellitus with hypoglycemia without coma: Secondary | ICD-10-CM | POA: Diagnosis present

## 2021-08-05 DIAGNOSIS — J690 Pneumonitis due to inhalation of food and vomit: Secondary | ICD-10-CM | POA: Diagnosis present

## 2021-08-05 DIAGNOSIS — Z91018 Allergy to other foods: Secondary | ICD-10-CM | POA: Diagnosis not present

## 2021-08-05 DIAGNOSIS — I5032 Chronic diastolic (congestive) heart failure: Secondary | ICD-10-CM | POA: Diagnosis present

## 2021-08-05 DIAGNOSIS — Z79899 Other long term (current) drug therapy: Secondary | ICD-10-CM | POA: Diagnosis not present

## 2021-08-05 DIAGNOSIS — Z794 Long term (current) use of insulin: Secondary | ICD-10-CM | POA: Diagnosis not present

## 2021-08-05 DIAGNOSIS — M546 Pain in thoracic spine: Secondary | ICD-10-CM | POA: Diagnosis present

## 2021-08-05 DIAGNOSIS — T68XXXA Hypothermia, initial encounter: Secondary | ICD-10-CM | POA: Diagnosis present

## 2021-08-05 DIAGNOSIS — Z20822 Contact with and (suspected) exposure to covid-19: Secondary | ICD-10-CM | POA: Diagnosis present

## 2021-08-05 DIAGNOSIS — M2550 Pain in unspecified joint: Secondary | ICD-10-CM

## 2021-08-05 DIAGNOSIS — J189 Pneumonia, unspecified organism: Secondary | ICD-10-CM | POA: Diagnosis not present

## 2021-08-05 DIAGNOSIS — Z9012 Acquired absence of left breast and nipple: Secondary | ICD-10-CM | POA: Diagnosis not present

## 2021-08-05 DIAGNOSIS — E162 Hypoglycemia, unspecified: Secondary | ICD-10-CM | POA: Diagnosis present

## 2021-08-05 LAB — GLUCOSE, CAPILLARY
Glucose-Capillary: 263 mg/dL — ABNORMAL HIGH (ref 70–99)
Glucose-Capillary: 171 mg/dL — ABNORMAL HIGH (ref 70–99)
Glucose-Capillary: 209 mg/dL — ABNORMAL HIGH (ref 70–99)
Glucose-Capillary: 201 mg/dL — ABNORMAL HIGH (ref 70–99)
Glucose-Capillary: 154 mg/dL — ABNORMAL HIGH (ref 70–99)

## 2021-08-05 LAB — URIC ACID: Uric Acid, Serum: 6.5 mg/dL (ref 2.5–7.1)

## 2021-08-05 IMAGING — DX DG KNEE 1-2V*R*
2 series · 2 of 2 positions shown · non-contrast
Comparison: None.

CLINICAL DATA: Right knee pain.

EXAM:
RIGHT KNEE - 1-2 VIEW

[knee ap]
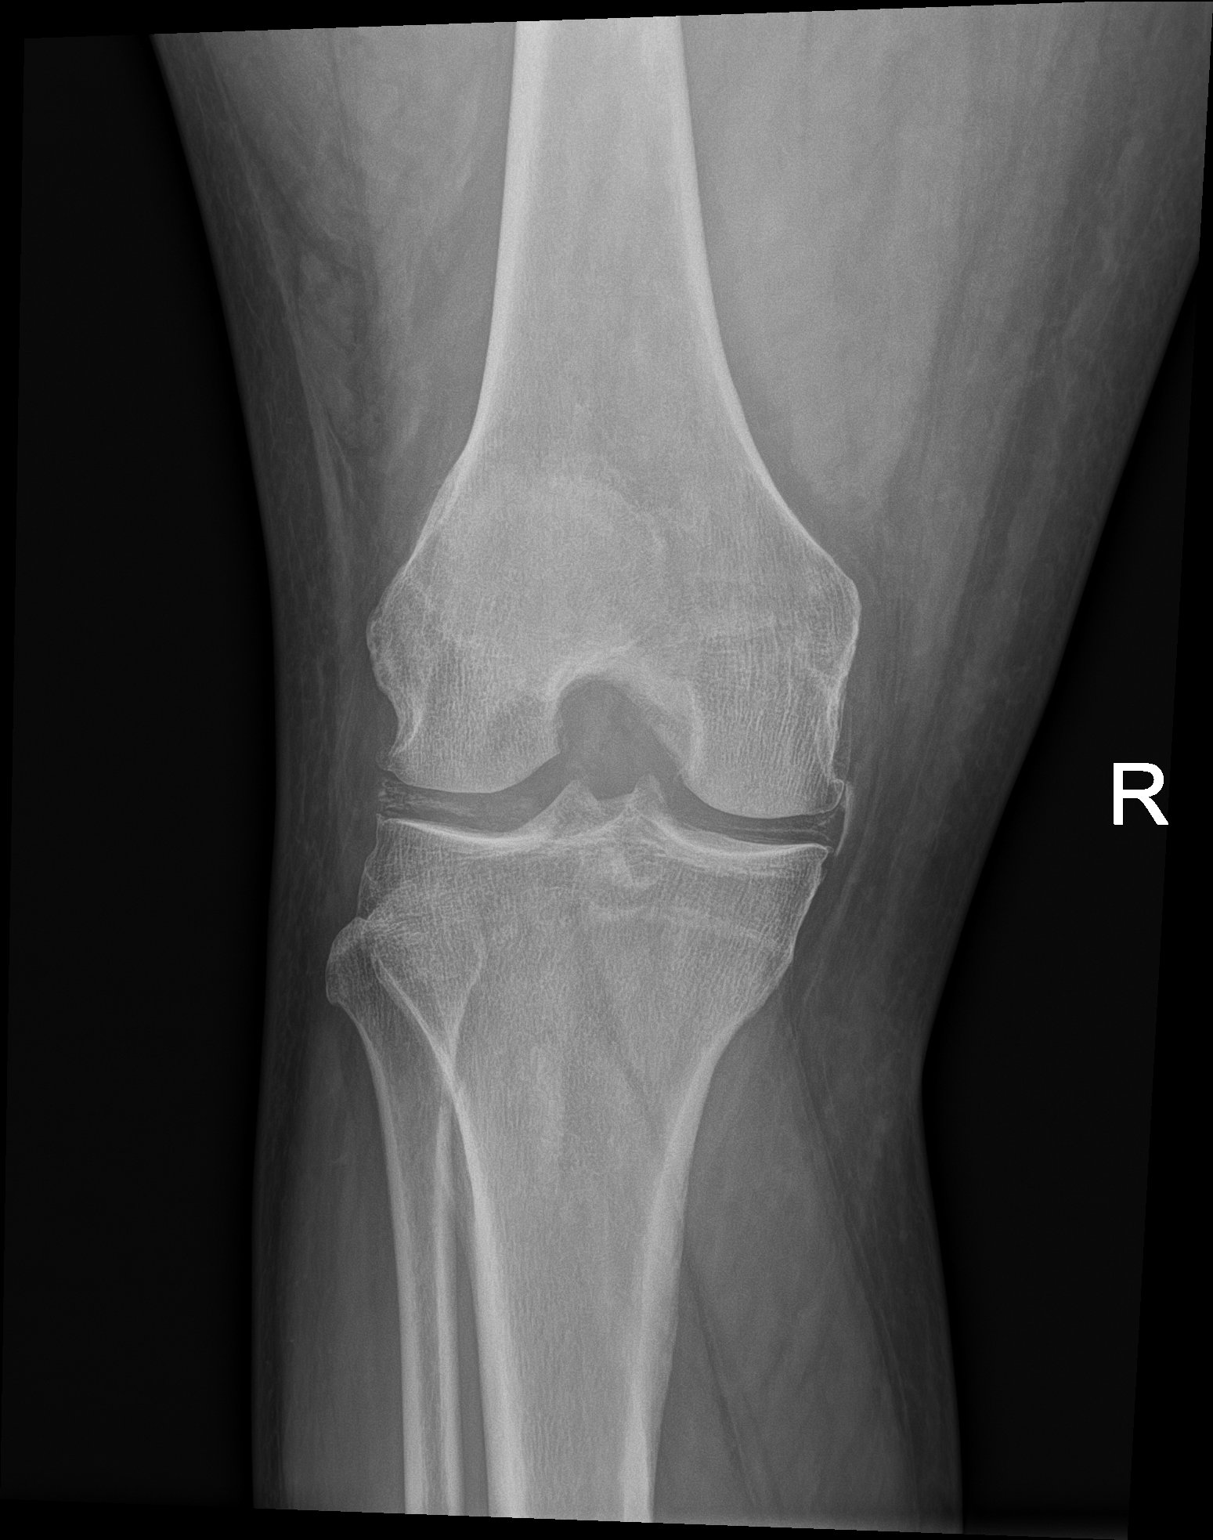

[knee lat]
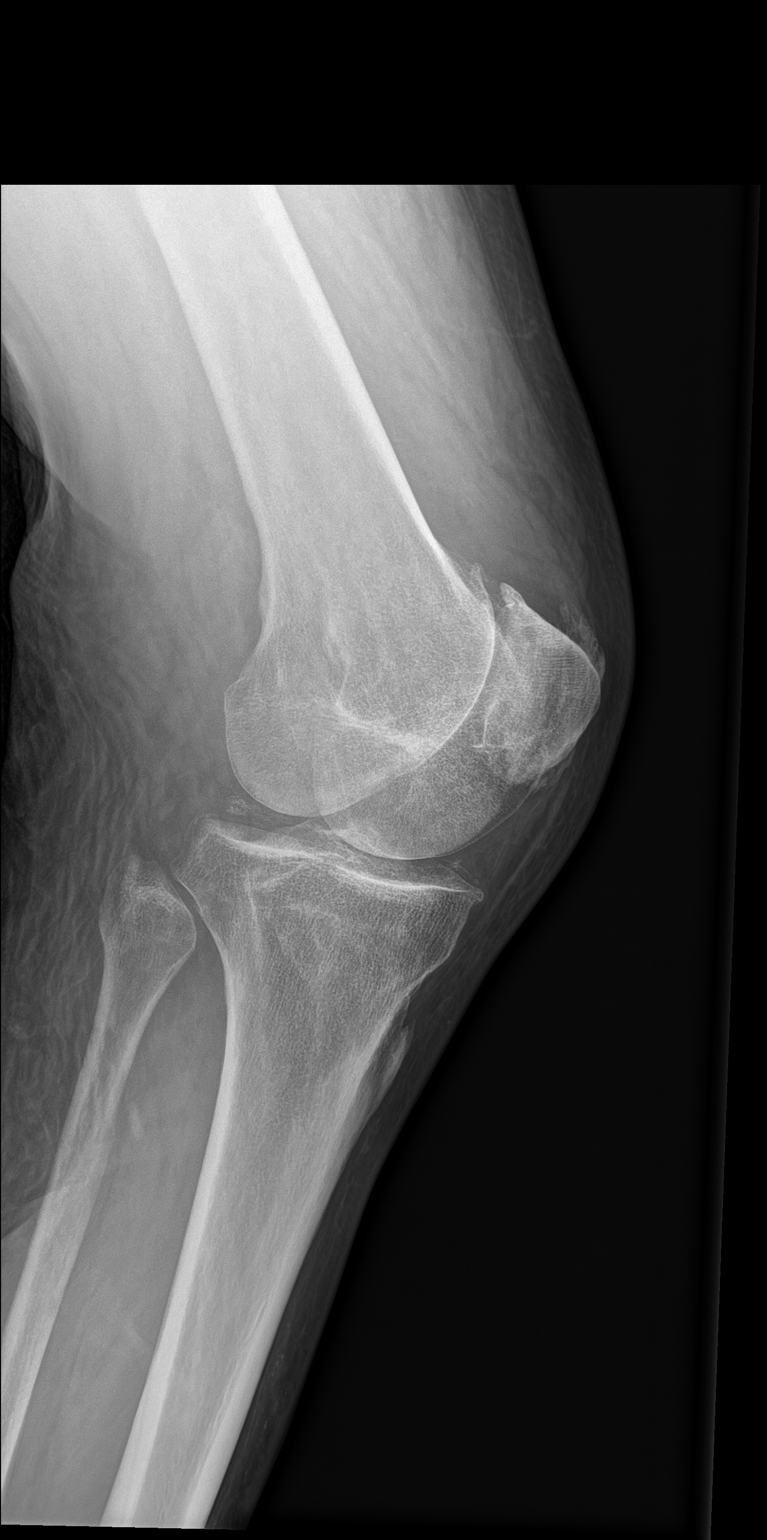

[2 of 2 positions shown; findings below may reference images not displayed]

FINDINGS: No evidence of fracture, dislocation, or joint effusion. Moderate
narrowing of patellofemoral space is noted. Chondrocalcinosis is
noted in the medial and lateral joint spaces. Soft tissues are
unremarkable.
IMPRESSION: Chondrocalcinosis is noted in the medial and lateral joint spaces
suggesting calcium pyrophosphate deposition disease or early
degenerative joint disease. Moderate degenerative changes seen
involving patellofemoral space. No acute abnormality is noted.

## 2021-08-05 MED ORDER — LIVING WELL WITH DIABETES BOOK
Freq: Once | Status: DC
  Filled 2021-08-05 (×2): qty 1

## 2021-08-05 MED ORDER — METHYLPREDNISOLONE ACETATE 40 MG/ML IJ SUSP
40.0000 mg | Freq: Once | INTRAMUSCULAR | Status: AC
  Administered 2021-08-06: 40 mg via INTRA_ARTICULAR
  Filled 2021-08-05: qty 1

## 2021-08-05 MED ORDER — INSULIN GLARGINE-YFGN 100 UNIT/ML ~~LOC~~ SOLN
30.0000 [IU] | Freq: Two times a day (BID) | SUBCUTANEOUS | Status: DC
  Administered 2021-08-05 (×2): 30 [IU] via SUBCUTANEOUS
  Filled 2021-08-05 (×4): qty 0.3

## 2021-08-05 MED ORDER — OXYCODONE-ACETAMINOPHEN 5-325 MG PO TABS
1.0000 | ORAL_TABLET | Freq: Three times a day (TID) | ORAL | Status: DC | PRN
  Administered 2021-08-05 (×2): 1 via ORAL
  Administered 2021-08-06: 2 via ORAL
  Administered 2021-08-06 – 2021-08-08 (×2): 1 via ORAL
  Administered 2021-08-09: 2 via ORAL
  Filled 2021-08-05: qty 2
  Filled 2021-08-05 (×3): qty 1
  Filled 2021-08-05: qty 2
  Filled 2021-08-05: qty 1

## 2021-08-05 MED ORDER — BUPIVACAINE HCL (PF) 0.5 % IJ SOLN
10.0000 mL | Freq: Once | INTRAMUSCULAR | Status: DC
  Filled 2021-08-05: qty 10

## 2021-08-05 MED ORDER — METHYLPREDNISOLONE ACETATE 40 MG/ML IJ SUSP
40.0000 mg | Freq: Once | INTRAMUSCULAR | Status: DC
  Filled 2021-08-05: qty 1

## 2021-08-05 MED ORDER — BUPIVACAINE HCL (PF) 0.5 % IJ SOLN
10.0000 mL | Freq: Once | INTRAMUSCULAR | Status: AC
  Administered 2021-08-06: 10 mL
  Filled 2021-08-05 (×2): qty 10

## 2021-08-05 MED ORDER — INSULIN ASPART 100 UNIT/ML IJ SOLN
0.0000 [IU] | Freq: Three times a day (TID) | INTRAMUSCULAR | Status: DC
Start: 2021-08-05 — End: 2021-08-09
  Administered 2021-08-05: 2 [IU] via SUBCUTANEOUS
  Administered 2021-08-05: 3 [IU] via SUBCUTANEOUS
  Administered 2021-08-05: 2 [IU] via SUBCUTANEOUS
  Administered 2021-08-06: 7 [IU] via SUBCUTANEOUS
  Administered 2021-08-06: 3 [IU] via SUBCUTANEOUS
  Administered 2021-08-06: 2 [IU] via SUBCUTANEOUS
  Administered 2021-08-07 (×2): 9 [IU] via SUBCUTANEOUS
  Administered 2021-08-07 (×2): 7 [IU] via SUBCUTANEOUS
  Administered 2021-08-08: 3 [IU] via SUBCUTANEOUS
  Administered 2021-08-08: 1 [IU] via SUBCUTANEOUS
  Administered 2021-08-08 (×2): 3 [IU] via SUBCUTANEOUS
  Administered 2021-08-09: 2 [IU] via SUBCUTANEOUS

## 2021-08-05 NOTE — TOC Transition Note (Signed)
Transition of Care [facility]) - CM/SW Discharge Note   Patient Details  Name: Piedad Standiford MRN: [id number] Date of Birth: 08/02/1951  [facility] CM/SW Contact:  Tom-Johnson, Renea Ee, RN Phone Number: 08/05/2021, 3:24 PM   Clinical Narrative:    CM spoke with patient at bedside about needs for post hospital transition. Presented with Altered mental status relating to hypoglycemia. Complains of bilateral legs pain especially rt knee and prevents her from walking. Plan is to check uric acid and x-ray. Orthopedic to evaluate and do arthrocentesis and rule out gout. Patient states she lives at home with her companion and her son. Has three children and seven grand children. Currently on disability. Has a cane, walker, wheelchair, shower chair at home. PCP is Cipriano Mile, MD and uses [facility]. Has [employer] Medicare. Medical workup continues.CM will follow with needs.    Barriers to Discharge: Continued Medical Work up   Patient Goals and CMS Choice Patient states their goals for this hospitalization and ongoing recovery are:: To go home CMS Medicare.gov Compare Post Acute Care list provided to:: Patient    Discharge Placement                       Discharge Plan and Services   Discharge Planning Services: CM Consult                                 Social Determinants of Health (SDOH) Interventions     Readmission Risk Interventions No flowsheet data found.

## 2021-08-05 NOTE — Progress Notes (Signed)
Lower extremity venous bilateral study completed.  Preliminary results relayed to Sloan Leiter, MD via secure chat.  See CV Proc for preliminary results report.   Darlin Coco, RDMS, RVT

## 2021-08-05 NOTE — Progress Notes (Signed)
Patient ID: Cheryl Blankenship, female   DOB: 02-Jun-1951, 70 y.o.   MRN: [id number]  Orthopedics aware of pt, plan for right knee arthrocentesis tomorrow morning.    Lisette Abu, PA-C Orthopedic Surgery [phone]

## 2021-08-05 NOTE — Evaluation (Signed)
Physical Therapy Evaluation Patient Details Name: Cheryl Blankenship MRN: [id number] DOB: 10/20/1950 Today's Date: 08/05/2021  History of Present Illness  Pt is a 70 y/o female admitted secnodary to hypoglycemia. Also found to have RLL PNA. PMH includes CHF, DM, HTN, breast cancer, and parkinson's disease.  Clinical Impression  Pt admitted secondary to problem above with deficits below. Pt with increased pain in R knee which made mobility tasks difficult. Required mod A for bed mobility and max A to stand using RW. Unable to take steps. Prior to admission, pt was able to ambulate short distances and perform transfers at a mod I level. Feel pt would benefit from SNF level therapies prior to d/c home. Will continue to follow acutely.      Recommendations for follow up therapy are one component of a multi-disciplinary discharge planning process, led by the attending physician.  Recommendations may be updated based on patient status, additional functional criteria and insurance authorization.  Follow Up Recommendations Skilled nursing-short term rehab (<3 hours/day)    Assistance Recommended at Discharge Frequent or constant Supervision/Assistance  Functional Status Assessment Patient has had a recent decline in their functional status and demonstrates the ability to make significant improvements in function in a reasonable and predictable amount of time.  Equipment Recommendations  Hospital bed    Recommendations for Other Services       Precautions / Restrictions Precautions Precautions: Fall Restrictions Weight Bearing Restrictions: No      Mobility  Bed Mobility Overal bed mobility: Needs Assistance Bed Mobility: Supine to Sit;Sit to Supine     Supine to sit: Mod assist Sit to supine: Mod assist   General bed mobility comments: Required assist for trunk and RLE assist to come to sitting. Required assist for BLE for return to supine.    Transfers Overall transfer level:  Needs assistance Equipment used: Rolling walker (2 wheels) Transfers: Sit to/from Stand Sit to Stand: Max assist           General transfer comment: Stood X 2 with max A. Difficulty with weightbearing on RLE and pt with heavy reliance on RW. Unable to take side steps    Ambulation/Gait                  Stairs            Wheelchair Mobility    Modified Rankin (Stroke Patients Only)       Balance Overall balance assessment: Needs assistance Sitting-balance support: No upper extremity supported;Feet supported Sitting balance-Leahy Scale: Fair     Standing balance support: Bilateral upper extremity supported;During functional activity Standing balance-Leahy Scale: Poor Standing balance comment: Reliant on BUE and external support                             Pertinent Vitals/Pain Pain Assessment: Faces Faces Pain Scale: Hurts whole lot Pain Location: R knee Pain Descriptors / Indicators: Grimacing;Guarding Pain Intervention(s): Limited activity within patient's tolerance;Monitored during session;Repositioned    Home Living Family/patient expects to be discharged to:: Private residence Living Arrangements: Spouse/significant other;Children Available Help at Discharge: Family Type of Home: Apartment Home Access: Stairs to enter Entrance Stairs-Rails: None Entrance Stairs-Number of Steps: 2   Home Layout: One level Home Equipment: Conservation officer, nature (2 wheels);Wheelchair - manual      Prior Function Prior Level of Function : Independent/Modified Independent             Mobility Comments: Reports she  can ambulate short distances with RW, but will use WC for longer distances.       Hand Dominance        Extremity/Trunk Assessment   Upper Extremity Assessment Upper Extremity Assessment: Defer to OT evaluation    Lower Extremity Assessment Lower Extremity Assessment: RLE deficits/detail RLE Deficits / Details: R knee swelling. Knee  flexion very limited secondary to pain.       Communication   Communication: Expressive difficulties  Cognition Arousal/Alertness: Awake/alert Behavior During Therapy: WFL for tasks assessed/performed Overall Cognitive Status: No family/caregiver present to determine baseline cognitive functioning                                          General Comments      Exercises     Assessment/Plan    PT Assessment Patient needs continued PT services  PT Problem List Decreased strength;Decreased balance;Decreased activity tolerance;Decreased range of motion;Decreased mobility;Decreased knowledge of use of DME;Decreased knowledge of precautions;Pain       PT Treatment Interventions DME instruction;Gait training;Therapeutic exercise;Functional mobility training;Therapeutic activities;Balance training;Patient/family education    PT Goals (Current goals can be found in the Care Plan section)  Acute Rehab PT Goals Patient Stated Goal: to decrease R knee pain PT Goal Formulation: With patient Time For Goal Achievement: 08/19/21 Potential to Achieve Goals: Good    Frequency Min 2X/week   Barriers to discharge        Co-evaluation               AM-PAC PT "6 Clicks" Mobility  Outcome Measure Help needed turning from your back to your side while in a flat bed without using bedrails?: A Little Help needed moving from lying on your back to sitting on the side of a flat bed without using bedrails?: A Lot Help needed moving to and from a bed to a chair (including a wheelchair)?: Total Help needed standing up from a chair using your arms (e.g., wheelchair or bedside chair)?: A Lot Help needed to walk in hospital room?: Total Help needed climbing 3-5 steps with a railing? : Total 6 Click Score: 10    End of Session Equipment Utilized During Treatment: Gait belt Activity Tolerance: Patient limited by pain Patient left: in bed;with call bell/phone within reach;with  bed alarm set Nurse Communication: Mobility status PT Visit Diagnosis: Unsteadiness on feet (R26.81);Muscle weakness (generalized) (M62.81);Difficulty in walking, not elsewhere classified (R26.2);Pain Pain - Right/Left: Right Pain - part of body: Knee    Time: 4944-9675 PT Time Calculation (min) (ACUTE ONLY): 20 min   Charges:   PT Evaluation $PT Eval Moderate Complexity: 1 Mod          Reuel Derby, PT, DPT  Acute Rehabilitation Services  Pager: [phone] Office: [phone]   Rudean Hitt 08/05/2021, 4:02 PM

## 2021-08-05 NOTE — Progress Notes (Signed)
PROGRESS NOTE        PATIENT DETAILS Name: Cheryl Blankenship Age: 70 y.o. Sex: female Date of Birth: 09-Nov-1950 Admit Date: 08/03/2021 Admitting Physician Evalee Mutton Kristeen Mans, MD WIO:[id number], Rachel Moulds, NP  Brief Narrative: Patient is a 70 y.o. female with history of DM-2, HFpEF, HTN, breast cancer, Parkinson's disease-admitted for altered mental status due to hypoglycemia.  Subjective: Complaining of pain in her bilateral lower extremities-mostly in her bilateral knee area-that apparently started earlier this morning/last night.  She claims she is not able to ambulate due to pain.  At times acknowledges feels like spasms.  Objective: Vitals: Blood pressure 117/70, pulse 61, temperature 98.2 F (36.8 C), temperature source Oral, resp. rate 16, height 5\' 3"  (1.6 m), weight 105.6 kg, SpO2 95 %.   Exam: Gen Exam:Alert awake-not in any distress HEENT:atraumatic, normocephalic Chest: B/L clear to auscultation anteriorly CVS:S1S2 regular Abdomen:soft non tender, non distended Extremities: Both lower extremities appear symmetrical-no erythema-no swelling.  Both knee joints appear symmetrical-without any overlying erythema.  Knee joints nontender to deep palpation-ankle joints nontender to deep palpation. Neurology: Non focal Skin: no rash  Pertinent Labs/Radiology: Recent Labs  Lab 08/03/21 2124 08/04/21 0256  WBC 6.2 7.0  HGB 12.1 11.7*  PLT 240 250  NA 142 136  K 4.6 4.2  CREATININE 0.92 0.82  AST 18 23  ALT 14 16  ALKPHOS 62 61  BILITOT 0.6 0.7     11/16>>Blood culture: No growth  11/15>>CXR: Patchy airspace disease in the right lower lung area. 11/17>> bilateral lower extremity Doppler: Negative for DVT.  Assessment/Plan: Hypoglycemia: Due to patient starting intermittent fasting/missing meals while still taking Lantus/NovoLog and Glucotrol XL.  CBGs have stabilized-extensive counseling done-she now understands the risk of life-threatening  hypoglycemia-and the importance of not missing meals.  She is aware that she needs to talk to her primary care practitioner if she intends to fast-as her insulin regimen may need to be adjusted.   Recent Labs    08/05/21 0431 08/05/21 0758 08/05/21 1132  GLUCAP 263* 171* 209*     DM-2 (A1c 8.4): CBGs creeping up-restart Lantus-30 units twice daily-on SSI.  Follow CBG trend and adjust accordingly.    Right lower lobe PNA: Probably aspiration pneumonitis in the setting of hypoglycemia/altered mental status.  Remains on empiric IV antibiotics-suspect 5 days of treatment is sufficient.  Bilateral lower extremity pain: Unclear etiology-she has underlying neuropathy but this pain apparently is somewhat different from her usual neuropathic pain.  No DVT on Doppler ultrasound.  Will see how she does with ambulation with physical therapy-and subsequently if needed-we will order further imaging studies.  On exam-I do not see any major abnormalities-leg is warm-no erythema/swelling or any other abnormality evident to me.  HFpEF: Compensated-resume diuretics once blood pressure improves a bit more.  HTN: BP creeping up-continue Coreg/amlodipine/losartan  HLD: Continue statin  Parkinson's disease: Stable-resume Requip  History of breast cancer-s/p left mastectomy: Follow-up with oncology as previous on Aromasin.  Peripheral neuropathy: Continue Neurontin/Lyrica/Cymbalta  Obesity Estimated body mass index is 41.24 kg/m as calculated from the following:   Height as of this encounter: 5\' 3"  (1.6 m).   Weight as of this encounter: 105.6 kg.     Procedures: None Consults: None DVT Prophylaxis: Lovenox Code Status:Full code  Family Communication: None at bedside.  Time spent: 25 minutes-Greater than 50% of this time  was spent in counseling, explanation of diagnosis, planning of further management, and coordination of care.  Disposition Plan: Status is: Observation  The patient remains  OBS appropriate and will d/c before 2 midnights.  Continue CBG monitoring for 1 more day-is on long-acting Glucotrol that might still be in her system-plan is to resume her insulin regimen and watch closely-if CBGs remain stable-we will go home on 11/17.   Diet: Diet Order             Diet heart healthy/carb modified Room service appropriate? Yes; Fluid consistency: Thin; Fluid restriction: 1200 mL Fluid  Diet effective now                     Antimicrobial agents: Anti-infectives (From admission, onward)    Start     Dose/Rate Route Frequency Ordered Stop   08/04/21 2300  azithromycin (ZITHROMAX) 500 mg in sodium chloride 0.9 % 250 mL IVPB        500 mg 250 mL/hr over 60 Minutes Intravenous Every 24 hours 08/04/21 0156 08/09/21 2259   08/04/21 2200  cefTRIAXone (ROCEPHIN) 2 g in sodium chloride 0.9 % 100 mL IVPB        2 g 200 mL/hr over 30 Minutes Intravenous Every 24 hours 08/04/21 0156 08/09/21 2159   08/03/21 2315  cefTRIAXone (ROCEPHIN) 1 g in sodium chloride 0.9 % 100 mL IVPB        1 g 200 mL/hr over 30 Minutes Intravenous  Once 08/03/21 2310 08/04/21 0125   08/03/21 2315  azithromycin (ZITHROMAX) 500 mg in sodium chloride 0.9 % 250 mL IVPB        500 mg 250 mL/hr over 60 Minutes Intravenous  Once 08/03/21 2310 08/04/21 0222        MEDICATIONS: Scheduled Meds:  amLODipine  10 mg Oral Daily   aspirin  81 mg Oral Daily   carvedilol  25 mg Oral BID WC   cycloSPORINE  1 drop Both Eyes BID   DULoxetine  30 mg Oral Daily   enoxaparin (LOVENOX) injection  40 mg Subcutaneous Q24H   exemestane  25 mg Oral QHS   folic acid  1 mg Oral QPM   gabapentin  400 mg Oral TID   insulin aspart  0-9 Units Subcutaneous TID AC & HS   insulin glargine-yfgn  30 Units Subcutaneous BID   living well with diabetes book   Does not apply Once   losartan  100 mg Oral Daily   pregabalin  75 mg Oral BID   rOPINIRole  0.25 mg Oral TID   rosuvastatin  5 mg Oral QHS   sodium chloride  flush  3 mL Intravenous Q12H   vitamin B-12  1,000 mcg Oral QPM   Continuous Infusions:  sodium chloride     azithromycin 500 mg (08/05/21 0042)   cefTRIAXone (ROCEPHIN)  IV 2 g (08/05/21 0043)   PRN Meds:.sodium chloride, acetaminophen **OR** acetaminophen, albuterol, ALPRAZolam, hydrALAZINE, methocarbamol, oxyCODONE-acetaminophen   I have personally reviewed following labs and imaging studies  LABORATORY DATA: CBC: Recent Labs  Lab 08/03/21 2124 08/04/21 0256  WBC 6.2 7.0  NEUTROABS 4.4  --   HGB 12.1 11.7*  HCT 38.5 37.9  MCV 90.0 89.4  PLT 240 250     Basic Metabolic Panel: Recent Labs  Lab 08/03/21 2124 08/04/21 0256  NA 142 136  K 4.6 4.2  CL 108 106  CO2 26 22  GLUCOSE 71 146*  BUN 17 17  CREATININE  0.92 0.82  CALCIUM 10.7* 10.0     GFR: Estimated Creatinine Clearance: 77.5 mL/min (by C-G formula based on SCr of 0.82 mg/dL).  Liver Function Tests: Recent Labs  Lab 08/03/21 2124 08/04/21 0256  AST 18 23  ALT 14 16  ALKPHOS 62 61  BILITOT 0.6 0.7  PROT 7.5 7.2  ALBUMIN 4.0 3.5    No results for input(s): LIPASE, AMYLASE in the last 168 hours. No results for input(s): AMMONIA in the last 168 hours.  Coagulation Profile: No results for input(s): INR, PROTIME in the last 168 hours.  Cardiac Enzymes: No results for input(s): CKTOTAL, CKMB, CKMBINDEX, TROPONINI in the last 168 hours.  BNP (last 3 results) No results for input(s): PROBNP in the last 8760 hours.  Lipid Profile: No results for input(s): CHOL, HDL, LDLCALC, TRIG, CHOLHDL, LDLDIRECT in the last 72 hours.  Thyroid Function Tests: No results for input(s): TSH, T4TOTAL, FREET4, T3FREE, THYROIDAB in the last 72 hours.  Anemia Panel: No results for input(s): VITAMINB12, FOLATE, FERRITIN, TIBC, IRON, RETICCTPCT in the last 72 hours.  Urine analysis:    Component Value Date/Time   COLORURINE YELLOW 08/03/2021 2137   APPEARANCEUR HAZY (A) 08/03/2021 2137   LABSPEC 1.012  08/03/2021 2137   PHURINE 7.0 08/03/2021 2137   GLUCOSEU 50 (A) 08/03/2021 2137   HGBUR NEGATIVE 08/03/2021 2137   BILIRUBINUR NEGATIVE 08/03/2021 2137   KETONESUR NEGATIVE 08/03/2021 2137   PROTEINUR NEGATIVE 08/03/2021 2137   NITRITE NEGATIVE 08/03/2021 2137   LEUKOCYTESUR NEGATIVE 08/03/2021 2137    Sepsis Labs: Lactic Acid, Venous No results found for: LATICACIDVEN  MICROBIOLOGY: Recent Results (from the past 240 hour(s))  Blood culture (routine x 2)     Status: None (Preliminary result)   Collection Time: 08/04/21 12:50 AM   Specimen: BLOOD LEFT HAND  Result Value Ref Range Status   Specimen Description BLOOD LEFT HAND  Final   Special Requests   Final    BOTTLES DRAWN AEROBIC AND ANAEROBIC Blood Culture adequate volume   Culture   Final    NO GROWTH < 12 HOURS Performed at [facility], [place] [address], [place], [place] [address]    Report Status PENDING  Incomplete  Resp Panel by RT-PCR (Flu A&B, Covid) Nasopharyngeal Swab     Status: None   Collection Time: 08/04/21 12:56 AM   Specimen: Nasopharyngeal Swab; Nasopharyngeal(NP) swabs in vial transport medium  Result Value Ref Range Status   SARS Coronavirus 2 by RT PCR NEGATIVE NEGATIVE Final    Comment: (NOTE) SARS-CoV-2 target nucleic acids are NOT DETECTED.  The SARS-CoV-2 RNA is generally detectable in upper respiratory specimens during the acute phase of infection. The lowest concentration of SARS-CoV-2 viral copies this assay can detect is 138 copies/mL. A negative result does not preclude SARS-Cov-2 infection and should not be used as the sole basis for treatment or other patient management decisions. A negative result may occur with  improper specimen collection/handling, submission of specimen other than nasopharyngeal swab, presence of viral mutation(s) within the areas targeted by this assay, and inadequate number of viral copies(<138 copies/mL). A negative result must be combined with clinical  observations, patient history, and epidemiological information. The expected result is Negative.  Fact Sheet for Patients:  EntrepreneurPulse.com.au  Fact Sheet for Healthcare Providers:  IncredibleEmployment.be  This test is no t yet approved or cleared by the [place] FDA and  has been authorized for detection and/or diagnosis of SARS-CoV-2 by FDA under an Emergency Use  Authorization (EUA). This EUA will remain  in effect (meaning this test can be used) for the duration of the COVID-19 declaration under Section 564(b)(1) of the Act, 21 U.S.C.section 360bbb-3(b)(1), unless the authorization is terminated  or revoked sooner.       Influenza A by PCR NEGATIVE NEGATIVE Final   Influenza B by PCR NEGATIVE NEGATIVE Final    Comment: (NOTE) The Xpert Xpress SARS-CoV-2/FLU/RSV plus assay is intended as an aid in the diagnosis of influenza from Nasopharyngeal swab specimens and should not be used as a sole basis for treatment. Nasal washings and aspirates are unacceptable for Xpert Xpress SARS-CoV-2/FLU/RSV testing.  Fact Sheet for Patients: EntrepreneurPulse.com.au  Fact Sheet for Healthcare Providers: IncredibleEmployment.be  This test is not yet approved or cleared by the [place] FDA and has been authorized for detection and/or diagnosis of SARS-CoV-2 by FDA under an Emergency Use Authorization (EUA). This EUA will remain in effect (meaning this test can be used) for the duration of the COVID-19 declaration under Section 564(b)(1) of the Act, 21 U.S.C. section 360bbb-3(b)(1), unless the authorization is terminated or revoked.  Performed at [facility], [place] [address] [place], [place] [address]   Blood culture (routine x 2)     Status: None (Preliminary result)   Collection Time: 08/04/21  1:23 AM   Specimen: BLOOD RIGHT HAND  Result Value Ref Range Status   Specimen Description BLOOD  RIGHT HAND  Final   Special Requests   Final    BOTTLES DRAWN AEROBIC AND ANAEROBIC Blood Culture adequate volume   Culture   Final    NO GROWTH < 12 HOURS Performed at [facility], [place] [address], [place], [place] [address]    Report Status PENDING  Incomplete    RADIOLOGY STUDIES/RESULTS: DG Chest Port 1 View  Result Date: 08/03/2021 CLINICAL DATA:  Back pain. EXAM: PORTABLE CHEST 1 VIEW COMPARISON:  No prior exams available FINDINGS: Right chest port with tip in the lower SVC. Upper normal heart size/mild cardiomegaly. Normal mediastinal contours. Patchy airspace disease in the right mid lower lung zone. Mild vascular congestion without pulmonary edema. There may be a small right pleural effusion. No pneumothorax. On limited evaluation, no acute osseous abnormalities are seen. Endplate spurring is seen in the thoracic spine, though not well assessed on this portable AP view. Surgical clips in the left axilla. IMPRESSION: 1. Patchy airspace disease in the right mid lower lung zone, suspicious for pneumonia. Possible small right pleural effusion. 2. Upper normal heart size/mild cardiomegaly. Mild vascular congestion without pulmonary edema. Electronically Signed   By: Keith Rake M.D.   On: 08/03/2021 22:55   VAS [place] LOWER EXTREMITY VENOUS (DVT)  Result Date: 08/05/2021  Lower Venous DVT Study Patient Name:  CHARLISA CHAM  Date of Exam:   08/05/2021 Medical Rec #: [id number]          Accession #:    [id number] Date of Birth: 1951/05/09          Patient Gender: F Patient Age:   20 years Exam Location:  [facility] Procedure:      VAS [place] LOWER EXTREMITY VENOUS (DVT) Referring Phys: Oren Binet --------------------------------------------------------------------------------  Indications: Swelling, pain in knees RT>LT.  Anticoagulation: Lovenox. Limitations: Body habitus. Comparison Study: No prior studies. Performing Technologist: Darlin Coco RDMS, RVT  Examination  Guidelines: A complete evaluation includes B-mode imaging, spectral Doppler, color Doppler, and power Doppler as needed of all accessible portions of each vessel. Bilateral  testing is considered an integral part of a complete examination. Limited examinations for reoccurring indications may be performed as noted. The reflux portion of the exam is performed with the patient in reverse Trendelenburg.  +---------+---------------+---------+-----------+---------------+--------------+ RIGHT    CompressibilityPhasicitySpontaneityProperties     Thrombus Aging +---------+---------------+---------+-----------+---------------+--------------+ CFV      Full           Yes      Yes                                      +---------+---------------+---------+-----------+---------------+--------------+ SFJ      Full                                                             +---------+---------------+---------+-----------+---------------+--------------+ FV Prox  Full                                                             +---------+---------------+---------+-----------+---------------+--------------+ FV Mid   Full                                                             +---------+---------------+---------+-----------+---------------+--------------+ FV DistalFull                               Duplicated -                                                              both patent                   +---------+---------------+---------+-----------+---------------+--------------+ PFV      Full                                                             +---------+---------------+---------+-----------+---------------+--------------+ POP      Full           Yes      Yes                                      +---------+---------------+---------+-----------+---------------+--------------+ PTV      Full                                                              +---------+---------------+---------+-----------+---------------+--------------+  PERO     Full                                                             +---------+---------------+---------+-----------+---------------+--------------+ Gastroc  Full                                                             +---------+---------------+---------+-----------+---------------+--------------+   +---------+---------------+---------+-----------+----------+--------------+ LEFT     CompressibilityPhasicitySpontaneityPropertiesThrombus Aging +---------+---------------+---------+-----------+----------+--------------+ CFV      Full           Yes      Yes                                 +---------+---------------+---------+-----------+----------+--------------+ SFJ      Full                                                        +---------+---------------+---------+-----------+----------+--------------+ FV Prox  Full                                                        +---------+---------------+---------+-----------+----------+--------------+ FV Mid   Full                                                        +---------+---------------+---------+-----------+----------+--------------+ FV DistalFull                                                        +---------+---------------+---------+-----------+----------+--------------+ PFV      Full                                                        +---------+---------------+---------+-----------+----------+--------------+ POP      Full           Yes      Yes                                 +---------+---------------+---------+-----------+----------+--------------+ PTV      Full                                                        +---------+---------------+---------+-----------+----------+--------------+  PERO     Full                                                         +---------+---------------+---------+-----------+----------+--------------+ Gastroc  Full                                                        +---------+---------------+---------+-----------+----------+--------------+     Summary: RIGHT: - There is no evidence of deep vein thrombosis in the lower extremity.  - No cystic structure found in the popliteal fossa.  LEFT: - There is no evidence of deep vein thrombosis in the lower extremity.  - No cystic structure found in the popliteal fossa.  *See table(s) above for measurements and observations.    Preliminary      LOS: 0 days   Oren Binet, MD  [facility]    To contact the attending provider between 7A-7P or the covering provider during after hours 7P-7A, please log into the web site [email or web address] and access using universal [place] password for that web site. If you do not have the password, please call the hospital operator.  08/05/2021, 1:01 PM

## 2021-08-05 NOTE — Progress Notes (Signed)
HOSPITAL MEDICINE OVERNIGHT EVENT NOTE    Notified by nursing that patient has been exhibiting progressively worsening hyperglycemia with blood sugars running between the 2-300 range throughout the evening and morning.  Chart reviewed, day provider's plan is to ensure that blood sugars are rising before reinitiating a sliding scale insulin regimen.  Since blood sugars seem to be consistently elevated at this point we will transition patient back to Accu-Chek before every meal and nightly with low intensity sliding scale insulin.    Vernelle Emerald  MD [facility]

## 2021-08-05 NOTE — Progress Notes (Signed)
Inpatient Diabetes Program Recommendations  AACE/ADA: New Consensus Statement on Inpatient Glycemic Control (2015)  Target Ranges:  Prepandial:   less than 140 mg/dL      Peak postprandial:   less than 180 mg/dL (1-2 hours)      Critically ill patients:  140 - 180 mg/dL   Lab Results  Component Value Date   GLUCAP 171 (H) 08/05/2021   HGBA1C 8.4 (H) 08/04/2021   Note: Libre 2 sensor applied to back of right upper and instructed patient included how to apply, how to scan, to avoid >500 mg Vitamin C, and sensor will need to be scanned @ least every 8 hrs. Patient verbalized understanding.  Thank you, Nani Gasser. Ariyana Faw, RN, MSN, CDE  Diabetes Coordinator Inpatient Glycemic Control Team Team Pager [phone] (8am-5pm) 08/05/2021 10:25 AM

## 2021-08-05 NOTE — Progress Notes (Signed)
HEMATOLOGY-ONCOLOGY PROGRESS NOTE  SUBJECTIVE: Cheryl Blankenship was admitted to the hospital due to altered mental status secondary to hypoglycemia.  She is followed by our office for breast cancer.  Has been on exemestane and Herceptin.  Resume these on 10/4.  Today, she tells me that she feels better with regards to her blood sugars.  She reports right knee pain.  States this is new since coming to the hospital.  However, she has had generalized arthralgias since resuming exemestane.  REVIEW OF SYSTEMS:   Constitutional: Denies fevers, chills  Eyes: Denies blurriness of vision Ears, nose, mouth, throat, and face: Denies mucositis or sore throat Respiratory: Denies cough, dyspnea or wheezes Cardiovascular: Denies palpitation, chest discomfort Gastrointestinal:  Denies nausea, heartburn or change in bowel habits Skin: Denies abnormal skin rashes Lymphatics: Denies new lymphadenopathy or easy bruising Neurological:Denies numbness, tingling or new weaknesses Behavioral/Psych: Mood is stable, no new changes  Extremities: No lower extremity edema MSK: Reports generalized arthralgias and right knee pain All other systems were reviewed with the patient and are negative.  I have reviewed the past medical history, past surgical history, social history and family history with the patient and they are unchanged from previous note.   PHYSICAL EXAMINATION: ECOG PERFORMANCE STATUS: 3 - Symptomatic, >50% confined to bed  Vitals:   08/05/21 0429 08/05/21 1133  BP: (!) 141/70 117/70  Pulse: 66 61  Resp: 18 16  Temp: 98.6 F (37 C) 98.2 F (36.8 C)  SpO2: 94% 95%   Filed Weights   08/03/21 2134 08/04/21 1532  Weight: 97.1 kg 105.6 kg    Intake/Output from previous day: 11/16 0701 - 11/17 0700 In: 360 [P.O.:360] Out: 1150 [Urine:1150]  GENERAL:alert, no distress and comfortable SKIN: skin color, texture, turgor are normal, no rashes or significant lesions EYES: normal, Conjunctiva are pink and  non-injected, sclera clear LUNGS: clear to auscultation and percussion with normal breathing effort HEART: regular rate & rhythm and no murmurs and no lower extremity edema ABDOMEN:abdomen soft, non-tender and normal bowel sounds Musculoskeletal: Right knee without overlying erythema.  No pain with palpation to the right knee. NEURO: alert & oriented x 3 with fluent speech, no focal motor/sensory deficits  LABORATORY DATA:  I have reviewed the data as listed CMP Latest Ref Rng & Units 08/04/2021 08/03/2021  Glucose 70 - 99 mg/dL 146(H) 71  BUN 8 - 23 mg/dL 17 17  Creatinine 0.44 - 1.00 mg/dL 0.82 0.92  Sodium 135 - 145 mmol/L 136 142  Potassium 3.5 - 5.1 mmol/L 4.2 4.6  Chloride 98 - 111 mmol/L 106 108  CO2 22 - 32 mmol/L 22 26  Calcium 8.9 - 10.3 mg/dL 10.0 10.7(H)  Total Protein 6.5 - 8.1 g/dL 7.2 7.5  Total Bilirubin 0.3 - 1.2 mg/dL 0.7 0.6  Alkaline Phos 38 - 126 U/L 61 62  AST 15 - 41 U/L 23 18  ALT 0 - 44 U/L 16 14    Lab Results  Component Value Date   WBC 7.0 08/04/2021   HGB 11.7 (L) 08/04/2021   HCT 37.9 08/04/2021   MCV 89.4 08/04/2021   PLT 250 08/04/2021   NEUTROABS 4.4 08/03/2021    DG Chest Port 1 View  Result Date: 08/03/2021 CLINICAL DATA:  Back pain. EXAM: PORTABLE CHEST 1 VIEW COMPARISON:  No prior exams available FINDINGS: Right chest port with tip in the lower SVC. Upper normal heart size/mild cardiomegaly. Normal mediastinal contours. Patchy airspace disease in the right mid lower lung zone. Mild vascular  congestion without pulmonary edema. There may be a small right pleural effusion. No pneumothorax. On limited evaluation, no acute osseous abnormalities are seen. Endplate spurring is seen in the thoracic spine, though not well assessed on this portable AP view. Surgical clips in the left axilla. IMPRESSION: 1. Patchy airspace disease in the right mid lower lung zone, suspicious for pneumonia. Possible small right pleural effusion. 2. Upper normal heart  size/mild cardiomegaly. Mild vascular congestion without pulmonary edema. Electronically Signed   By: Keith Rake M.D.   On: 08/03/2021 22:55   VAS [place] LOWER EXTREMITY VENOUS (DVT)  Result Date: 08/05/2021  Lower Venous DVT Study Patient Name:  KEESHIA SANDERLIN  Date of Exam:   08/05/2021 Medical Rec #: [id number]          Accession #:    [id number] Date of Birth: 1951/04/13          Patient Gender: F Patient Age:   70 years Exam Location:  [facility] Procedure:      VAS [place] LOWER EXTREMITY VENOUS (DVT) Referring Phys: Oren Binet --------------------------------------------------------------------------------  Indications: Swelling, pain in knees RT>LT.  Anticoagulation: Lovenox. Limitations: Body habitus. Comparison Study: No prior studies. Performing Technologist: Darlin Coco RDMS, RVT  Examination Guidelines: A complete evaluation includes B-mode imaging, spectral Doppler, color Doppler, and power Doppler as needed of all accessible portions of each vessel. Bilateral testing is considered an integral part of a complete examination. Limited examinations for reoccurring indications may be performed as noted. The reflux portion of the exam is performed with the patient in reverse Trendelenburg.  +---------+---------------+---------+-----------+---------------+--------------+ RIGHT    CompressibilityPhasicitySpontaneityProperties     Thrombus Aging +---------+---------------+---------+-----------+---------------+--------------+ CFV      Full           Yes      Yes                                      +---------+---------------+---------+-----------+---------------+--------------+ SFJ      Full                                                             +---------+---------------+---------+-----------+---------------+--------------+ FV Prox  Full                                                              +---------+---------------+---------+-----------+---------------+--------------+ FV Mid   Full                                                             +---------+---------------+---------+-----------+---------------+--------------+ FV DistalFull                               Duplicated -  both patent                   +---------+---------------+---------+-----------+---------------+--------------+ PFV      Full                                                             +---------+---------------+---------+-----------+---------------+--------------+ POP      Full           Yes      Yes                                      +---------+---------------+---------+-----------+---------------+--------------+ PTV      Full                                                             +---------+---------------+---------+-----------+---------------+--------------+ PERO     Full                                                             +---------+---------------+---------+-----------+---------------+--------------+ Gastroc  Full                                                             +---------+---------------+---------+-----------+---------------+--------------+   +---------+---------------+---------+-----------+----------+--------------+ LEFT     CompressibilityPhasicitySpontaneityPropertiesThrombus Aging +---------+---------------+---------+-----------+----------+--------------+ CFV      Full           Yes      Yes                                 +---------+---------------+---------+-----------+----------+--------------+ SFJ      Full                                                        +---------+---------------+---------+-----------+----------+--------------+ FV Prox  Full                                                         +---------+---------------+---------+-----------+----------+--------------+ FV Mid   Full                                                        +---------+---------------+---------+-----------+----------+--------------+  FV DistalFull                                                        +---------+---------------+---------+-----------+----------+--------------+ PFV      Full                                                        +---------+---------------+---------+-----------+----------+--------------+ POP      Full           Yes      Yes                                 +---------+---------------+---------+-----------+----------+--------------+ PTV      Full                                                        +---------+---------------+---------+-----------+----------+--------------+ PERO     Full                                                        +---------+---------------+---------+-----------+----------+--------------+ Gastroc  Full                                                        +---------+---------------+---------+-----------+----------+--------------+     Summary: RIGHT: - There is no evidence of deep vein thrombosis in the lower extremity.  - No cystic structure found in the popliteal fossa.  LEFT: - There is no evidence of deep vein thrombosis in the lower extremity.  - No cystic structure found in the popliteal fossa.  *See table(s) above for measurements and observations.    Preliminary     ASSESSMENT AND PLAN: 1.  Stage II left breast cancer 2.  Hypoglycemia, resolved 3.  Diabetes mellitus 4.  Right lower lobe pneumonia 5.  Right knee pain/arthralgias 6.  HFpEF 7.  Hypertension 8.  Hyperlipidemia 9.  Parkinson's disease  -The patient has been on exemestane since early October for her breast cancer.  Now reports increased arthralgias as well as right knee pain today.  We will place exemestane on hold.  She missed an infusion  appointment last week and we are working to reschedule this. -Etiology of her right knee pain is not entirely clear.  Further work-up in process including uric acid and x-ray.  Could be related to exemestane and if this is the case, this will slowly improve with holding exemestane. -Management of chronic medical conditions per hospitalist.  No future appointments.    LOS: 0 days   Mikey Bussing, DNP, AGPCNP-BC, AOCNP 08/05/21

## 2021-08-05 NOTE — Progress Notes (Signed)
Inpatient Diabetes Program Recommendations  AACE/ADA: New Consensus Statement on Inpatient Glycemic Control (2015)  Target Ranges:  Prepandial:   less than 140 mg/dL      Peak postprandial:   less than 180 mg/dL (1-2 hours)      Critically ill patients:  140 - 180 mg/dL   Lab Results  Component Value Date   GLUCAP 171 (H) 08/05/2021   HGBA1C 8.4 (H) 08/04/2021    Diabetes history: DM2 Outpatient Diabetes medications:  Glipizide 10 QD Novolog 0-10 TID Lantus 70 units BID Current orders for Inpatient glycemic control:  Novolog 0-9 units TID & HS Semglee 30 units BID  Consult received for insulin and diabetes education.  Spoke with patient on the phone as this DM coordinator is working remotely today.  Asked her about her DM management.  She states she recently had her breast removed due to breast cancer and has not been eating well because of stress.  She lives with her family and they work during the day.  She states her PCP is Dr. Posey Pronto but has not been to the office because she lost her Medicaid and did not have transportation.  She states she has her Medicaid now and will be able to schedule a follow up appointment. She Checks her BS with her meter but sometimes runs out of strips.  She does not drink beverages with sugar.  She has hypoglycemia unawareness.  Spoke with her about using a CGM.  She had not heard of this but states she would be very interested in using one.  Explained the settings can be adjusted to alarm her if she gets low and she will know to eat or drink something.  Asked Dr. Sloan Leiter if we could place a [employer] on her today prior to DC.  He agrees; also asked to send her with a prescription for the [facility] sensors Order # [id number].  Ordered LWWD booklet.  Will continue to follow while inpatient.  Thank you, Reche Dixon, RN, BSN Diabetes Coordinator Inpatient Diabetes Program [phone] (team pager from 8a-5p)

## 2021-08-05 NOTE — Progress Notes (Signed)
Reevaluated patient this afternoon: Her pain now seems to have settled down in her right knee area.  Previously right knee was not very tender-now it is tender to palpation.  She is unable to bear weight.  She denies any falls.  Will check uric acid and x-ray-suspect will need orthopedic evaluation to see if we can do arthrocentesis and rule out gout etc.

## 2021-08-05 NOTE — Care Management Obs Status (Signed)
[place] NOTIFICATION   Patient Details  Name: Cheryl Blankenship MRN: [id number] Date of Birth: 1950-10-09   Medicare Observation Status Notification Given:  Yes    Tom-Johnson, Renea Ee, RN 08/05/2021, 11:56 AM

## 2021-08-06 ENCOUNTER — Telehealth: Payer: Self-pay | Admitting: Hematology

## 2021-08-06 ENCOUNTER — Encounter (HOSPITAL_COMMUNITY): Payer: Self-pay | Admitting: Internal Medicine

## 2021-08-06 DIAGNOSIS — R52 Pain, unspecified: Secondary | ICD-10-CM

## 2021-08-06 DIAGNOSIS — G20 Parkinson's disease: Secondary | ICD-10-CM | POA: Diagnosis not present

## 2021-08-06 DIAGNOSIS — E162 Hypoglycemia, unspecified: Secondary | ICD-10-CM | POA: Diagnosis not present

## 2021-08-06 DIAGNOSIS — M11261 Other chondrocalcinosis, right knee: Secondary | ICD-10-CM

## 2021-08-06 DIAGNOSIS — J189 Pneumonia, unspecified organism: Secondary | ICD-10-CM | POA: Diagnosis not present

## 2021-08-06 LAB — COMPREHENSIVE METABOLIC PANEL
ALT: 13 U/L (ref 0–44)
AST: 10 U/L — ABNORMAL LOW (ref 15–41)
Albumin: 3.1 g/dL — ABNORMAL LOW (ref 3.5–5.0)
Alkaline Phosphatase: 49 U/L (ref 38–126)
Anion gap: 5 (ref 5–15)
BUN: 20 mg/dL (ref 8–23)
CO2: 28 mmol/L (ref 22–32)
Calcium: 10.3 mg/dL (ref 8.9–10.3)
Chloride: 105 mmol/L (ref 98–111)
Creatinine, Ser: 1.12 mg/dL — ABNORMAL HIGH (ref 0.44–1.00)
GFR, Estimated: 54 mL/min — ABNORMAL LOW (ref >60)
Glucose, Bld: 79 mg/dL (ref 70–99)
Potassium: 4.1 mmol/L (ref 3.5–5.1)
Sodium: 138 mmol/L (ref 135–145)
Total Bilirubin: 0.5 mg/dL (ref 0.3–1.2)
Total Protein: 6.4 g/dL — ABNORMAL LOW (ref 6.5–8.1)

## 2021-08-06 LAB — CBC
HCT: 31.7 % — ABNORMAL LOW (ref 36.0–46.0)
Hemoglobin: 10 g/dL — ABNORMAL LOW (ref 12.0–15.0)
MCH: 28 pg (ref 26.0–34.0)
MCHC: 31.5 g/dL (ref 30.0–36.0)
MCV: 88.8 fL (ref 80.0–100.0)
Platelets: 230 10*3/uL (ref 150–400)
RBC: 3.57 MIL/uL — ABNORMAL LOW (ref 3.87–5.11)
RDW: 13.2 % (ref 11.5–15.5)
WBC: 5 10*3/uL (ref 4.0–10.5)
nRBC: 0 % (ref 0.0–0.2)

## 2021-08-06 LAB — SYNOVIAL CELL COUNT + DIFF, W/ CRYSTALS
Crystals, Fluid: NONE SEEN
Eosinophils-Synovial: 0 % (ref 0–1)
Lymphocytes-Synovial Fld: 3 % (ref 0–20)
Monocyte-Macrophage-Synovial Fluid: 8 % — ABNORMAL LOW (ref 50–90)
Neutrophil, Synovial: 89 % — ABNORMAL HIGH (ref 0–25)
WBC, Synovial: 5400 /mm3 — ABNORMAL HIGH (ref 0–200)

## 2021-08-06 LAB — GLUCOSE, CAPILLARY
Glucose-Capillary: 214 mg/dL — ABNORMAL HIGH (ref 70–99)
Glucose-Capillary: 54 mg/dL — ABNORMAL LOW (ref 70–99)
Glucose-Capillary: 77 mg/dL (ref 70–99)
Glucose-Capillary: 159 mg/dL — ABNORMAL HIGH (ref 70–99)
Glucose-Capillary: 169 mg/dL — ABNORMAL HIGH (ref 70–99)
Glucose-Capillary: 227 mg/dL — ABNORMAL HIGH (ref 70–99)
Glucose-Capillary: 312 mg/dL — ABNORMAL HIGH (ref 70–99)

## 2021-08-06 LAB — PROTIME-INR
INR: 1.1 (ref 0.8–1.2)
Prothrombin Time: 13.7 seconds (ref 11.4–15.2)

## 2021-08-06 MED ORDER — INSULIN GLARGINE-YFGN 100 UNIT/ML ~~LOC~~ SOLN
20.0000 [IU] | Freq: Every day | SUBCUTANEOUS | Status: DC
  Administered 2021-08-06: 20 [IU] via SUBCUTANEOUS
  Filled 2021-08-06 (×2): qty 0.2

## 2021-08-06 MED ORDER — INSULIN GLARGINE-YFGN 100 UNIT/ML ~~LOC~~ SOLN
25.0000 [IU] | Freq: Every day | SUBCUTANEOUS | Status: DC
  Administered 2021-08-06: 25 [IU] via SUBCUTANEOUS
  Filled 2021-08-06 (×2): qty 0.25

## 2021-08-06 MED ORDER — PREGABALIN 75 MG PO CAPS
75.0000 mg | ORAL_CAPSULE | Freq: Every day | ORAL | Status: DC
  Administered 2021-08-07 – 2021-08-08 (×2): 75 mg via ORAL
  Filled 2021-08-06 (×2): qty 1

## 2021-08-06 MED ORDER — GABAPENTIN 400 MG PO CAPS
400.0000 mg | ORAL_CAPSULE | Freq: Every morning | ORAL | Status: DC
  Administered 2021-08-07 – 2021-08-09 (×3): 400 mg via ORAL
  Filled 2021-08-06 (×3): qty 1

## 2021-08-06 NOTE — TOC Progression Note (Addendum)
Transition of Care [facility]) - Progression Note    Patient Details  Name: Cheryl Blankenship MRN: [id number] Date of Birth: 10-Apr-1951  Transition of Care [facility]) CM/SW Contact  Cheryl Chars, Cheryl Blankenship Phone Number: 08/06/2021, 12:44 PM  Clinical Narrative: CSW met with pt regarding recommendation for SNF.  Pt agreeable to this, choice document given.  Permission given to speak with husband and son.  Pt reports she is vaccinated for covid with 2 boosters.  CSW began process to obtain PASSR, pt SSN not in epic.  CSW went and spoke to pt to get SSN, explained need for PASSR.  Pt states she wants to speak with her son before giving SSN to CSW.  CSW assisted pt as she attempted to call son, multiple attempts made and pt having difficulty with using her cell phone.  She agreed to have her son call CSW and then will give SSN.  CSW notes different number for son listed on face sheet, however this phone number is disconnected.  Daughter Cheryl Blankenship also listed on face sheet, CSW was able to reach daughter by phone, explained the situation and plan for SNF, and she agreed to call pt about SSN.    1245: CSW went to pt room again.  Pt reports she has not been able to speak with her son, still not willing to share SSN until she does.      Expected Discharge Plan: Skilled Nursing Facility Barriers to Discharge: SNF Pending bed offer  Expected Discharge Plan and Services Expected Discharge Plan: [place]   Discharge Planning Services: CM Consult   Living arrangements for the past 2 months: Apartment                                       Social Determinants of Health (SDOH) Interventions    Readmission Risk Interventions No flowsheet data found.

## 2021-08-06 NOTE — NC FL2 (Signed)
[place] LEVEL OF CARE SCREENING TOOL     IDENTIFICATION  Patient Name: Cheryl Blankenship Birthdate: [birth year] Sex: female Admission Date (Current Location): 08/03/2021  [place] and [place] Number:  Kathleen Argue [id number] [place] and Address:  The [place]. [facility], [place] [address] [place], [place] [address]      Provider Number: [id number]  Attending Physician Name and Address:  Jonetta Osgood, MD  Relative Name and Phone Number:  Jessica Priest Daughter [phone]    Current Level of Care: Hospital Recommended Level of Care: [place] Prior Approval Number:    Date Approved/Denied:   PASRR Number:    Discharge Plan: SNF    Current Diagnoses: Patient Active Problem List   Diagnosis Date Noted   Hypothermia 08/04/2021   CAP (community acquired pneumonia) 08/04/2021   Chronic diastolic CHF (congestive heart failure) ([place]) 08/04/2021   Parkinson disease ([place]) 08/04/2021   Breast cancer ([place]) 08/04/2021   Hypoglycemia 08/03/2021    Orientation RESPIRATION BLADDER Height & Weight     Self, Time, Place  Normal External catheter Weight: 232 lb 12.9 oz (105.6 kg) Height:  5\' 3"  (160 cm)  BEHAVIORAL SYMPTOMS/MOOD NEUROLOGICAL BOWEL NUTRITION STATUS      Continent Diet (see discharge summary)  AMBULATORY STATUS COMMUNICATION OF NEEDS Skin   Total Care Verbally Surgical wounds                       Personal Care Assistance Level of Assistance  Bathing, Feeding, Dressing Bathing Assistance: Limited assistance Feeding assistance: Limited assistance Dressing Assistance: Maximum assistance     Functional Limitations Info  Sight, Hearing, Speech Sight Info: Adequate Hearing Info: Adequate Speech Info: Adequate    SPECIAL CARE FACTORS FREQUENCY  PT (By licensed PT), OT (By licensed OT)     PT Frequency: 5x week OT Frequency: 5x week            Contractures Contractures Info: Not present    Additional  Factors Info  Code Status, Allergies Code Status Info: full Allergies Info: Coconut (Cocos Nucifera) Allergy Skin Test, Hydrochlorothiazide, Other, Lisinopril           Current Medications (08/06/2021):  This is the current hospital active medication list Current Facility-Administered Medications  Medication Dose Route Frequency Provider Last Rate Last Admin   0.9 %  sodium chloride infusion  250 mL Intravenous PRN Rise Patience, MD       acetaminophen (TYLENOL) tablet 650 mg  650 mg Oral Q6H PRN Rise Patience, MD   650 mg at 08/04/21 1013   Or   acetaminophen (TYLENOL) suppository 650 mg  650 mg Rectal Q6H PRN Rise Patience, MD       albuterol (PROVENTIL) (2.5 MG/3ML) 0.083% nebulizer solution 2.5 mg  2.5 mg Inhalation Q4H PRN Rise Patience, MD       ALPRAZolam Duanne Moron) tablet 0.25-0.5 mg  0.25-0.5 mg Oral TID PRN Rise Patience, MD   0.5 mg at 08/05/21 2109   amLODipine (NORVASC) tablet 10 mg  10 mg Oral Daily Rise Patience, MD   10 mg at 08/06/21 1042   aspirin chewable tablet 81 mg  81 mg Oral Daily Rise Patience, MD   81 mg at 08/06/21 1041   bupivacaine (MARCAINE) 0.5 % injection 10 mL  10 mL Infiltration Once Jonetta Osgood, MD       carvedilol (COREG) tablet 25 mg  25 mg Oral BID WC Hal Hope,  Doreatha Lew, MD   25 mg at 08/06/21 0854   cefTRIAXone (ROCEPHIN) 2 g in sodium chloride 0.9 % 100 mL IVPB  2 g Intravenous Q24H Rise Patience, MD   Stopped at 08/05/21 2132   cycloSPORINE (RESTASIS) 0.05 % ophthalmic emulsion 1 drop  1 drop Both Eyes BID Jonetta Osgood, MD   1 drop at 08/06/21 1042   DULoxetine (CYMBALTA) DR capsule 30 mg  30 mg Oral Daily Rise Patience, MD   30 mg at 08/06/21 1041   enoxaparin (LOVENOX) injection 40 mg  40 mg Subcutaneous Q24H Rise Patience, MD   40 mg at 16/94/50 3888   folic acid (FOLVITE) tablet 1 mg  1 mg Oral QPM Rise Patience, MD   1 mg at 08/05/21 1815   [START ON  08/07/2021] gabapentin (NEURONTIN) capsule 400 mg  400 mg Oral q morning Ghimire, Henreitta Leber, MD       hydrALAZINE (APRESOLINE) injection 10 mg  10 mg Intravenous Q4H PRN Rise Patience, MD       insulin aspart (novoLOG) injection 0-9 Units  0-9 Units Subcutaneous TID AC & HS Shalhoub, Sherryll Burger, MD   2 Units at 08/06/21 1231   insulin glargine-yfgn (SEMGLEE) injection 20 Units  20 Units Subcutaneous QHS Ghimire, Henreitta Leber, MD       insulin glargine-yfgn [facility]) injection 25 Units  25 Units Subcutaneous Daily Jonetta Osgood, MD   25 Units at 08/06/21 1232   living well with diabetes book MISC   Does not apply Once Jonetta Osgood, MD       losartan (COZAAR) tablet 100 mg  100 mg Oral Daily Rise Patience, MD   100 mg at 08/06/21 1041   methocarbamol (ROBAXIN) tablet 500 mg  500 mg Oral Q8H PRN Rise Patience, MD   500 mg at 08/05/21 1815   methylPREDNISolone acetate (DEPO-MEDROL) injection 40 mg  40 mg Intra-articular Once Jonetta Osgood, MD       oxyCODONE-acetaminophen (PERCOCET/ROXICET) 5-325 MG per tablet 1-2 tablet  1-2 tablet Oral Q8H PRN Jonetta Osgood, MD   1 tablet at 08/06/21 1230   [START ON 08/07/2021] pregabalin (LYRICA) capsule 75 mg  75 mg Oral QHS Ghimire, Shanker M, MD       rOPINIRole (REQUIP) tablet 0.25 mg  0.25 mg Oral TID Rise Patience, MD   0.25 mg at 08/06/21 1042   rosuvastatin (CRESTOR) tablet 5 mg  5 mg Oral QHS Rise Patience, MD   5 mg at 08/05/21 2103   sodium chloride flush (NS) 0.9 % injection 3 mL  3 mL Intravenous Q12H Rise Patience, MD   3 mL at 08/06/21 1044   vitamin B-12 (CYANOCOBALAMIN) tablet 1,000 mcg  1,000 mcg Oral QPM Rise Patience, MD   1,000 mcg at 08/05/21 1815     Discharge Medications: Please see discharge summary for a list of discharge medications.  Relevant Imaging Results:  Relevant Lab Results:   Additional Information Pt reports she is vaccinated for covid with 2 boosters.   SSN currently unavailable.  Joanne Chars, LCSW

## 2021-08-06 NOTE — Consult Note (Signed)
Reason for Consult:Right knee pain Referring Physician: Oren Binet Time called: 2536 Time at bedside: Cheryl Blankenship is an 70 y.o. female.  HPI: Cheryl Blankenship was admitted 2d ago with a hypoglycemic event. At the time she was c/o polyarthralgia mostly affecting her knees. This quickly became concentrated in her right knee and is affecting her mobility. She denies prior hx/o similar or gout. She denies fevers, chills, sweats, N/V, or trauma.  Past Medical History:  Diagnosis Date   Cancer ([place])    Congestive heart failure (CHF) ([facility])    Diabetes mellitus without complication ([facility])    High cholesterol    Hypertension     Past Surgical History:  Procedure Laterality Date   BREAST SURGERY      Family History  Family history unknown: Yes    Social History:  reports that she has never smoked. She has never used smokeless tobacco. She reports that she does not drink alcohol and does not use drugs.  Allergies:  Allergies  Allergen Reactions   Coconut (Cocos Nucifera) Allergy Skin Test    Hydrochlorothiazide Swelling   Other     Pt is allergic to a pain medication. Unsure of the name   Lisinopril Rash    Medications: I have reviewed the patient's current medications.  Results for orders placed or performed during the hospital encounter of 08/03/21 (from the past 48 hour(s))  CBG monitoring, ED     Status: Abnormal   Collection Time: 08/04/21 10:47 AM  Result Value Ref Range   Glucose-Capillary 135 (H) 70 - 99 mg/dL    Comment: Glucose reference range applies only to samples taken after fasting for at least 8 hours.  CBG monitoring, ED     Status: Abnormal   Collection Time: 08/04/21 12:13 PM  Result Value Ref Range   Glucose-Capillary 152 (H) 70 - 99 mg/dL    Comment: Glucose reference range applies only to samples taken after fasting for at least 8 hours.  CBG monitoring, ED     Status: Abnormal   Collection Time: 08/04/21  1:19 PM  Result Value Ref Range    Glucose-Capillary 173 (H) 70 - 99 mg/dL    Comment: Glucose reference range applies only to samples taken after fasting for at least 8 hours.  CBG monitoring, ED     Status: Abnormal   Collection Time: 08/04/21  2:12 PM  Result Value Ref Range   Glucose-Capillary 211 (H) 70 - 99 mg/dL    Comment: Glucose reference range applies only to samples taken after fasting for at least 8 hours.  Glucose, capillary     Status: Abnormal   Collection Time: 08/04/21  3:45 PM  Result Value Ref Range   Glucose-Capillary 241 (H) 70 - 99 mg/dL    Comment: Glucose reference range applies only to samples taken after fasting for at least 8 hours.  Glucose, capillary     Status: Abnormal   Collection Time: 08/04/21  8:38 PM  Result Value Ref Range   Glucose-Capillary 313 (H) 70 - 99 mg/dL    Comment: Glucose reference range applies only to samples taken after fasting for at least 8 hours.  Glucose, capillary     Status: Abnormal   Collection Time: 08/04/21 11:55 PM  Result Value Ref Range   Glucose-Capillary 307 (H) 70 - 99 mg/dL    Comment: Glucose reference range applies only to samples taken after fasting for at least 8 hours.  Glucose, capillary  Status: Abnormal   Collection Time: 08/05/21  4:31 AM  Result Value Ref Range   Glucose-Capillary 263 (H) 70 - 99 mg/dL    Comment: Glucose reference range applies only to samples taken after fasting for at least 8 hours.  Glucose, capillary     Status: Abnormal   Collection Time: 08/05/21  7:58 AM  Result Value Ref Range   Glucose-Capillary 171 (H) 70 - 99 mg/dL    Comment: Glucose reference range applies only to samples taken after fasting for at least 8 hours.  Glucose, capillary     Status: Abnormal   Collection Time: 08/05/21 11:32 AM  Result Value Ref Range   Glucose-Capillary 209 (H) 70 - 99 mg/dL    Comment: Glucose reference range applies only to samples taken after fasting for at least 8 hours.  Uric acid     Status: None   Collection Time:  08/05/21  4:07 PM  Result Value Ref Range   Uric Acid, Serum 6.5 2.5 - 7.1 mg/dL    Comment: Performed at [facility] [address] [place], [place] [address]  Glucose, capillary     Status: Abnormal   Collection Time: 08/05/21  4:48 PM  Result Value Ref Range   Glucose-Capillary 201 (H) 70 - 99 mg/dL    Comment: Glucose reference range applies only to samples taken after fasting for at least 8 hours.  Glucose, capillary     Status: Abnormal   Collection Time: 08/05/21  9:08 PM  Result Value Ref Range   Glucose-Capillary 154 (H) 70 - 99 mg/dL    Comment: Glucose reference range applies only to samples taken after fasting for at least 8 hours.  CBC     Status: Abnormal   Collection Time: 08/06/21  4:02 AM  Result Value Ref Range   WBC 5.0 4.0 - 10.5 K/uL   RBC 3.57 (L) 3.87 - 5.11 MIL/uL   Hemoglobin 10.0 (L) 12.0 - 15.0 g/dL   HCT 31.7 (L) 36.0 - 46.0 %   MCV 88.8 80.0 - 100.0 fL   MCH 28.0 26.0 - 34.0 pg   MCHC 31.5 30.0 - 36.0 g/dL   RDW 13.2 11.5 - 15.5 %   Platelets 230 150 - 400 K/uL   nRBC 0.0 0.0 - 0.2 %    Comment: Performed at [facility], [place] [address] [place], [place] [address]  Comprehensive metabolic panel     Status: Abnormal   Collection Time: 08/06/21  4:02 AM  Result Value Ref Range   Sodium 138 135 - 145 mmol/L   Potassium 4.1 3.5 - 5.1 mmol/L   Chloride 105 98 - 111 mmol/L   CO2 28 22 - 32 mmol/L   Glucose, Bld 79 70 - 99 mg/dL    Comment: Glucose reference range applies only to samples taken after fasting for at least 8 hours.   BUN 20 8 - 23 mg/dL   Creatinine, Ser 1.12 (H) 0.44 - 1.00 mg/dL   Calcium 10.3 8.9 - 10.3 mg/dL   Total Protein 6.4 (L) 6.5 - 8.1 g/dL   Albumin 3.1 (L) 3.5 - 5.0 g/dL   AST 10 (L) 15 - 41 U/L   ALT 13 0 - 44 U/L   Alkaline Phosphatase 49 38 - 126 U/L   Total Bilirubin 0.5 0.3 - 1.2 mg/dL   GFR, Estimated 54 (L) >60 mL/min    Comment: (NOTE) Calculated using the CKD-EPI Creatinine Equation (2021)  Anion gap 5 5 - 15    Comment: Performed at [facility] [address] [place], [place] [address]  Protime-INR     Status: None   Collection Time: 08/06/21  4:02 AM  Result Value Ref Range   Prothrombin Time 13.7 11.4 - 15.2 seconds   INR 1.1 0.8 - 1.2    Comment: (NOTE) INR goal varies based on device and disease states. Performed at [facility], [place] [address], [place], [place] [address]   Glucose, capillary     Status: Abnormal   Collection Time: 08/06/21  4:38 AM  Result Value Ref Range   Glucose-Capillary 214 (H) 70 - 99 mg/dL    Comment: Glucose reference range applies only to samples taken after fasting for at least 8 hours.  Glucose, capillary     Status: Abnormal   Collection Time: 08/06/21  8:14 AM  Result Value Ref Range   Glucose-Capillary 54 (L) 70 - 99 mg/dL    Comment: Glucose reference range applies only to samples taken after fasting for at least 8 hours.  Glucose, capillary     Status: None   Collection Time: 08/06/21  8:34 AM  Result Value Ref Range   Glucose-Capillary 77 70 - 99 mg/dL    Comment: Glucose reference range applies only to samples taken after fasting for at least 8 hours.    DG Knee 1-2 Views Right  Result Date: 08/05/2021 CLINICAL DATA:  Right knee pain. EXAM: RIGHT KNEE - 1-2 VIEW COMPARISON:  None. FINDINGS: No evidence of fracture, dislocation, or joint effusion. Moderate narrowing of patellofemoral space is noted. Chondrocalcinosis is noted in the medial and lateral joint spaces. Soft tissues are unremarkable. IMPRESSION: Chondrocalcinosis is noted in the medial and lateral joint spaces suggesting calcium pyrophosphate deposition disease or early degenerative joint disease. Moderate degenerative changes seen involving patellofemoral space. No acute abnormality is noted. Electronically Signed   By: Marijo Conception M.D.   On: 08/05/2021 15:41   VAS [place] LOWER EXTREMITY VENOUS (DVT)  Result Date: 08/05/2021  Lower Venous DVT Study  Patient Name:  Cheryl Blankenship  Date of Exam:   08/05/2021 Medical Rec #: [id number]          Accession #:    [id number] Date of Birth: 10-17-1950          Patient Gender: F Patient Age:   73 years Exam Location:  [facility] Procedure:      VAS [place] LOWER EXTREMITY VENOUS (DVT) Referring Phys: Oren Binet --------------------------------------------------------------------------------  Indications: Swelling, pain in knees RT>LT.  Anticoagulation: Lovenox. Limitations: Body habitus. Comparison Study: No prior studies. Performing Technologist: Darlin Coco RDMS, RVT  Examination Guidelines: A complete evaluation includes B-mode imaging, spectral Doppler, color Doppler, and power Doppler as needed of all accessible portions of each vessel. Bilateral testing is considered an integral part of a complete examination. Limited examinations for reoccurring indications may be performed as noted. The reflux portion of the exam is performed with the patient in reverse Trendelenburg.  +---------+---------------+---------+-----------+---------------+--------------+ RIGHT    CompressibilityPhasicitySpontaneityProperties     Thrombus Aging +---------+---------------+---------+-----------+---------------+--------------+ CFV      Full           Yes      Yes                                      +---------+---------------+---------+-----------+---------------+--------------+ SFJ  Full                                                             +---------+---------------+---------+-----------+---------------+--------------+ FV Prox  Full                                                             +---------+---------------+---------+-----------+---------------+--------------+ FV Mid   Full                                                             +---------+---------------+---------+-----------+---------------+--------------+ FV DistalFull                               Duplicated  -                                                              both patent                   +---------+---------------+---------+-----------+---------------+--------------+ PFV      Full                                                             +---------+---------------+---------+-----------+---------------+--------------+ POP      Full           Yes      Yes                                      +---------+---------------+---------+-----------+---------------+--------------+ PTV      Full                                                             +---------+---------------+---------+-----------+---------------+--------------+ PERO     Full                                                             +---------+---------------+---------+-----------+---------------+--------------+ Gastroc  Full                                                             +---------+---------------+---------+-----------+---------------+--------------+   +---------+---------------+---------+-----------+----------+--------------+  LEFT     CompressibilityPhasicitySpontaneityPropertiesThrombus Aging +---------+---------------+---------+-----------+----------+--------------+ CFV      Full           Yes      Yes                                 +---------+---------------+---------+-----------+----------+--------------+ SFJ      Full                                                        +---------+---------------+---------+-----------+----------+--------------+ FV Prox  Full                                                        +---------+---------------+---------+-----------+----------+--------------+ FV Mid   Full                                                        +---------+---------------+---------+-----------+----------+--------------+ FV DistalFull                                                         +---------+---------------+---------+-----------+----------+--------------+ PFV      Full                                                        +---------+---------------+---------+-----------+----------+--------------+ POP      Full           Yes      Yes                                 +---------+---------------+---------+-----------+----------+--------------+ PTV      Full                                                        +---------+---------------+---------+-----------+----------+--------------+ PERO     Full                                                        +---------+---------------+---------+-----------+----------+--------------+ Gastroc  Full                                                        +---------+---------------+---------+-----------+----------+--------------+  Summary: RIGHT: - There is no evidence of deep vein thrombosis in the lower extremity.  - No cystic structure found in the popliteal fossa.  LEFT: - There is no evidence of deep vein thrombosis in the lower extremity.  - No cystic structure found in the popliteal fossa.  *See table(s) above for measurements and observations. Electronically signed by Orlie Pollen on 08/05/2021 at 7:04:10 PM.    Final     Review of Systems  Constitutional:  Negative for chills, diaphoresis and fever.  HENT:  Negative for ear discharge, ear pain, hearing loss and tinnitus.   Eyes:  Negative for photophobia and pain.  Respiratory:  Negative for cough and shortness of breath.   Cardiovascular:  Negative for chest pain.  Gastrointestinal:  Negative for abdominal pain, nausea and vomiting.  Genitourinary:  Negative for dysuria, flank pain, frequency and urgency.  Musculoskeletal:  Positive for arthralgias (Right knee). Negative for back pain, myalgias and neck pain.  Neurological:  Negative for dizziness and headaches.  Hematological:  Does not bruise/bleed easily.  Psychiatric/Behavioral:  The patient  is not nervous/anxious.   Blood pressure (!) 151/82, pulse 65, temperature 98.4 F (36.9 C), temperature source Oral, resp. rate 16, height 5\' 3"  (1.6 m), weight 105.6 kg, SpO2 94 %. Physical Exam Constitutional:      General: She is not in acute distress.    Appearance: She is well-developed. She is not diaphoretic.  HENT:     Head: Normocephalic and atraumatic.  Eyes:     General: No scleral icterus.       Right eye: No discharge.        Left eye: No discharge.     Conjunctiva/sclera: Conjunctivae normal.  Cardiovascular:     Rate and Rhythm: Normal rate and regular rhythm.  Pulmonary:     Effort: Pulmonary effort is normal. No respiratory distress.  Musculoskeletal:     Cervical back: Normal range of motion.     Comments: RLE No traumatic wounds, ecchymosis, or rash  Severe TTP  Mild knee effusion  Sens DPN, SPN, TN intact  Motor EHL, ext, flex, evers 5/5  DP 1+, PT 1+, No significant edema  Skin:    General: Skin is warm and dry.  Neurological:     Mental Status: She is alert.  Psychiatric:        Mood and Affect: Mood normal.        Behavior: Behavior normal.    Assessment/Plan: Right knee pain -- Will aspirate and inject.    Lisette Abu, PA-C Orthopedic Surgery [phone] 08/06/2021, 9:41 AM

## 2021-08-06 NOTE — Progress Notes (Signed)
Inpatient Diabetes Program Recommendations  AACE/ADA: New Consensus Statement on Inpatient Glycemic Control   Target Ranges:  Prepandial:   less than 140 mg/dL      Peak postprandial:   less than 180 mg/dL (1-2 hours)      Critically ill patients:  140 - 180 mg/dL    Latest Reference Range & Units 08/05/21 07:58 08/05/21 11:32 08/05/21 16:48 08/05/21 21:08 08/06/21 04:38 08/06/21 08:14 08/06/21 08:34  Glucose-Capillary 70 - 99 mg/dL 171 (H) 209 (H) 201 (H) 154 (H) 214 (H) 54 (L) 77  (H): Data is abnormally high (L): Data is abnormally low  Review of Glycemic Control  Diabetes history: DM2 Outpatient Diabetes medications: Lantus 70 units BID, Novolog 0-10 units TID with meals, Glipizide 10 mg daily Current orders for Inpatient glycemic control: Semglee 30 units BID, Novolog 0-9 units AC&HS  Inpatient Diabetes Program Recommendations:    Insulin: Fasting glucose 54 mg/dl. Please consider changing Semglee to 30 units QAM and 25 units QHS. Please consider ordering Novolog 3 units TID with meals for meal coverage if patient eats at least 50% of meals.   Thanks, Barnie Alderman, RN, MSN, CDE Diabetes Coordinator Inpatient Diabetes Program [phone] (Team Pager from 8am to 5pm)

## 2021-08-06 NOTE — Progress Notes (Signed)
   08/06/21 0825  Provider Notification  Provider Name/Title Ghimire MD  Date Provider Notified 08/06/21  Time Provider Notified [phone]  Notification Type Face-to-face  Notification Reason Critical result (glucose 54)  Test performed and critical result CBG  Date Critical Result Received 08/06/21  Time Critical Result Received 0815  Provider response At bedside  Date of Provider Response 08/06/21  Time of Provider Response [phone]

## 2021-08-06 NOTE — Progress Notes (Signed)
PROGRESS NOTE        PATIENT DETAILS Name: Cheryl Blankenship Age: 70 y.o. Sex: female Date of Birth: [birth year] Admit Date: 08/03/2021 Admitting Physician Evalee Mutton Kristeen Mans, MD MPN:[id number], Rachel Moulds, NP  Brief Narrative: Patient is a 70 y.o. female with history of DM-2, HFpEF, HTN, breast cancer, Parkinson's disease-admitted for altered mental status due to hypoglycemia.  Subjective: Continues to have right knee pain.  Pain is severe and she is unable to bear weight.  Objective: Vitals: Blood pressure (!) 151/82, pulse 65, temperature 98.4 F (36.9 C), temperature source Oral, resp. rate 16, height 5\' 3"  (1.6 m), weight 105.6 kg, SpO2 94 %.   Exam: Gen Exam:Alert awake-not in any distress HEENT:atraumatic, normocephalic Chest: B/L clear to auscultation anteriorly CVS:S1S2 regular Abdomen:soft non tender, non distended Extremities:no edema.  Right knee appears with slight effusion-significantly tender to mild palpation.  No overlying erythema. Neurology: Non focal Skin: no rash   Pertinent Labs/Radiology: Recent Labs  Lab 08/03/21 2124 08/04/21 0256 08/06/21 0402  WBC 6.2 7.0 5.0  HGB 12.1 11.7* 10.0*  PLT 240 250 230  NA 142 136 138  K 4.6 4.2 4.1  CREATININE 0.92 0.82 1.12*  AST 18 23 10*  ALT 14 16 13   ALKPHOS 62 61 49  BILITOT 0.6 0.7 0.5     11/16>>Blood culture: No growth  11/15>>CXR: Patchy airspace disease in the right lower lung area. 11/17>> bilateral lower extremity Doppler: Negative for DVT.  Assessment/Plan: Hypoglycemia: Due to patient starting intermittent fasting/missing meals while still taking Lantus/NovoLog and Glucotrol XL.  Once CBGs stabilized-she was restarted on Lantus insulin 30 units twice daily (approximately half outpatient dose)-she appears to have had a episode of hypoglycemia this morning (per patient-she did not eat dinner well).  Will adjust insulin regimen-decrease Semglee to 25 units in a.m. and 20 units  in p.m.  Continue to follow and adjust accordingly.  Recent Labs    08/06/21 0438 08/06/21 0814 08/06/21 0834  GLUCAP 214* 54* 77     DM-2 (A1c 8.4): See above  Right lower lobe PNA: Suspect aspiration-continue Rocephin x5 days-okay to stop Zithromax x3 days.    Right knee arthralgia: Suspect gout/pseudogout flare-triggered by acute illness-she has no prior history of gouty arthritis.  Orthopedics performed arthrocentesis and intra-articular steroid injection.  Await synovial fluid analysis.  HFpEF: Compensated-resume diuretics once blood pressure improves a bit more.  HTN: BP stable-continue Coreg/amlodipine and losartan.  HLD: Continue statin  Parkinson's disease: Stable-continue Requip  History of breast cancer-s/p left mastectomy: Appreciate oncology follow-up-Aromasin held due to right knee arthritis/arthralgia.   Peripheral neuropathy: Continue Neurontin/Lyrica/Cymbalta  Obesity Estimated body mass index is 41.24 kg/m as calculated from the following:   Height as of this encounter: 5\' 3"  (1.6 m).   Weight as of this encounter: 105.6 kg.     Procedures: None Consults: None DVT Prophylaxis: Lovenox Code Status:Full code  Family Communication: None at bedside.  Time spent: 25 minutes-Greater than 50% of this time was spent in counseling, explanation of diagnosis, planning of further management, and coordination of care.  Disposition Plan: Status is: Inpatient  Remain inpatient-not yet stable for discharge given severity of right knee pain.  Unable to bear weight.  Diet: Diet Order             Diet heart healthy/carb modified Room service appropriate? Yes; Fluid consistency: Thin;  Fluid restriction: 1200 mL Fluid  Diet effective now                     Antimicrobial agents: Anti-infectives (From admission, onward)    Start     Dose/Rate Route Frequency Ordered Stop   08/04/21 2300  azithromycin (ZITHROMAX) 500 mg in sodium chloride 0.9 % 250 mL  IVPB        500 mg 250 mL/hr over 60 Minutes Intravenous Every 24 hours 08/04/21 0156 08/09/21 2259   08/04/21 2200  cefTRIAXone (ROCEPHIN) 2 g in sodium chloride 0.9 % 100 mL IVPB        2 g 200 mL/hr over 30 Minutes Intravenous Every 24 hours 08/04/21 0156 08/09/21 2159   08/03/21 2315  cefTRIAXone (ROCEPHIN) 1 g in sodium chloride 0.9 % 100 mL IVPB        1 g 200 mL/hr over 30 Minutes Intravenous  Once 08/03/21 2310 08/04/21 0125   08/03/21 2315  azithromycin (ZITHROMAX) 500 mg in sodium chloride 0.9 % 250 mL IVPB        500 mg 250 mL/hr over 60 Minutes Intravenous  Once 08/03/21 2310 08/04/21 0222        MEDICATIONS: Scheduled Meds:  amLODipine  10 mg Oral Daily   aspirin  81 mg Oral Daily   bupivacaine  10 mL Infiltration Once   carvedilol  25 mg Oral BID WC   cycloSPORINE  1 drop Both Eyes BID   DULoxetine  30 mg Oral Daily   enoxaparin (LOVENOX) injection  40 mg Subcutaneous T36I   folic acid  1 mg Oral QPM   gabapentin  400 mg Oral TID   insulin aspart  0-9 Units Subcutaneous TID AC & HS   insulin glargine-yfgn  30 Units Subcutaneous BID   living well with diabetes book   Does not apply Once   losartan  100 mg Oral Daily   methylPREDNISolone acetate  40 mg Intra-articular Once   pregabalin  75 mg Oral BID   rOPINIRole  0.25 mg Oral TID   rosuvastatin  5 mg Oral QHS   sodium chloride flush  3 mL Intravenous Q12H   vitamin B-12  1,000 mcg Oral QPM   Continuous Infusions:  sodium chloride     azithromycin 500 mg (08/05/21 2356)   cefTRIAXone (ROCEPHIN)  IV Stopped (08/05/21 2132)   PRN Meds:.sodium chloride, acetaminophen **OR** acetaminophen, albuterol, ALPRAZolam, hydrALAZINE, methocarbamol, oxyCODONE-acetaminophen   I have personally reviewed following labs and imaging studies  LABORATORY DATA: CBC: Recent Labs  Lab 08/03/21 2124 08/04/21 0256 08/06/21 0402  WBC 6.2 7.0 5.0  NEUTROABS 4.4  --   --   HGB 12.1 11.7* 10.0*  HCT 38.5 37.9 31.7*  MCV  90.0 89.4 88.8  PLT 240 250 230     Basic Metabolic Panel: Recent Labs  Lab 08/03/21 2124 08/04/21 0256 08/06/21 0402  NA 142 136 138  K 4.6 4.2 4.1  CL 108 106 105  CO2 26 22 28   GLUCOSE 71 146* 79  BUN 17 17 20   CREATININE 0.92 0.82 1.12*  CALCIUM 10.7* 10.0 10.3     GFR: Estimated Creatinine Clearance: 56.7 mL/min (A) (by C-G formula based on SCr of 1.12 mg/dL (H)).  Liver Function Tests: Recent Labs  Lab 08/03/21 2124 08/04/21 0256 08/06/21 0402  AST 18 23 10*  ALT 14 16 13   ALKPHOS 62 61 49  BILITOT 0.6 0.7 0.5  PROT 7.5 7.2 6.4*  ALBUMIN  4.0 3.5 3.1*    No results for input(s): LIPASE, AMYLASE in the last 168 hours. No results for input(s): AMMONIA in the last 168 hours.  Coagulation Profile: Recent Labs  Lab 08/06/21 0402  INR 1.1    Cardiac Enzymes: No results for input(s): CKTOTAL, CKMB, CKMBINDEX, TROPONINI in the last 168 hours.  BNP (last 3 results) No results for input(s): PROBNP in the last 8760 hours.  Lipid Profile: No results for input(s): CHOL, HDL, LDLCALC, TRIG, CHOLHDL, LDLDIRECT in the last 72 hours.  Thyroid Function Tests: No results for input(s): TSH, T4TOTAL, FREET4, T3FREE, THYROIDAB in the last 72 hours.  Anemia Panel: No results for input(s): VITAMINB12, FOLATE, FERRITIN, TIBC, IRON, RETICCTPCT in the last 72 hours.  Urine analysis:    Component Value Date/Time   COLORURINE YELLOW 08/03/2021 2137   APPEARANCEUR HAZY (A) 08/03/2021 2137   LABSPEC 1.012 08/03/2021 2137   PHURINE 7.0 08/03/2021 2137   GLUCOSEU 50 (A) 08/03/2021 2137   HGBUR NEGATIVE 08/03/2021 2137   BILIRUBINUR NEGATIVE 08/03/2021 2137   KETONESUR NEGATIVE 08/03/2021 2137   PROTEINUR NEGATIVE 08/03/2021 2137   NITRITE NEGATIVE 08/03/2021 2137   LEUKOCYTESUR NEGATIVE 08/03/2021 2137    Sepsis Labs: Lactic Acid, Venous No results found for: LATICACIDVEN  MICROBIOLOGY: Recent Results (from the past 240 hour(s))  Blood culture (routine x 2)      Status: None (Preliminary result)   Collection Time: 08/04/21 12:50 AM   Specimen: BLOOD LEFT HAND  Result Value Ref Range Status   Specimen Description BLOOD LEFT HAND  Final   Special Requests   Final    BOTTLES DRAWN AEROBIC AND ANAEROBIC Blood Culture adequate volume   Culture   Final    NO GROWTH 1 DAY Performed at [facility], [place] [address], [place], [place] [address]    Report Status PENDING  Incomplete  Resp Panel by RT-PCR (Flu A&B, Covid) Nasopharyngeal Swab     Status: None   Collection Time: 08/04/21 12:56 AM   Specimen: Nasopharyngeal Swab; Nasopharyngeal(NP) swabs in vial transport medium  Result Value Ref Range Status   SARS Coronavirus 2 by RT PCR NEGATIVE NEGATIVE Final    Comment: (NOTE) SARS-CoV-2 target nucleic acids are NOT DETECTED.  The SARS-CoV-2 RNA is generally detectable in upper respiratory specimens during the acute phase of infection. The lowest concentration of SARS-CoV-2 viral copies this assay can detect is 138 copies/mL. A negative result does not preclude SARS-Cov-2 infection and should not be used as the sole basis for treatment or other patient management decisions. A negative result may occur with  improper specimen collection/handling, submission of specimen other than nasopharyngeal swab, presence of viral mutation(s) within the areas targeted by this assay, and inadequate number of viral copies(<138 copies/mL). A negative result must be combined with clinical observations, patient history, and epidemiological information. The expected result is Negative.  Fact Sheet for Patients:  EntrepreneurPulse.com.au  Fact Sheet for Healthcare Providers:  IncredibleEmployment.be  This test is no t yet approved or cleared by the [place] FDA and  has been authorized for detection and/or diagnosis of SARS-CoV-2 by FDA under an Emergency Use Authorization (EUA). This EUA will remain  in effect  (meaning this test can be used) for the duration of the COVID-19 declaration under Section 564(b)(1) of the Act, 21 U.S.C.section 360bbb-3(b)(1), unless the authorization is terminated  or revoked sooner.       Influenza A by PCR NEGATIVE NEGATIVE Final   Influenza B by PCR  NEGATIVE NEGATIVE Final    Comment: (NOTE) The Xpert Xpress SARS-CoV-2/FLU/RSV plus assay is intended as an aid in the diagnosis of influenza from Nasopharyngeal swab specimens and should not be used as a sole basis for treatment. Nasal washings and aspirates are unacceptable for Xpert Xpress SARS-CoV-2/FLU/RSV testing.  Fact Sheet for Patients: EntrepreneurPulse.com.au  Fact Sheet for Healthcare Providers: IncredibleEmployment.be  This test is not yet approved or cleared by the [place] FDA and has been authorized for detection and/or diagnosis of SARS-CoV-2 by FDA under an Emergency Use Authorization (EUA). This EUA will remain in effect (meaning this test can be used) for the duration of the COVID-19 declaration under Section 564(b)(1) of the Act, 21 U.S.C. section 360bbb-3(b)(1), unless the authorization is terminated or revoked.  Performed at [facility], [place] [address], [place], [place] [address]   Blood culture (routine x 2)     Status: None (Preliminary result)   Collection Time: 08/04/21  1:23 AM   Specimen: BLOOD RIGHT HAND  Result Value Ref Range Status   Specimen Description BLOOD RIGHT HAND  Final   Special Requests   Final    BOTTLES DRAWN AEROBIC AND ANAEROBIC Blood Culture adequate volume   Culture   Final    NO GROWTH 1 DAY Performed at [facility], [place] [address], [place], [place] [address]    Report Status PENDING  Incomplete    RADIOLOGY STUDIES/RESULTS: DG Knee 1-2 Views Right  Result Date: 08/05/2021 CLINICAL DATA:  Right knee pain. EXAM: RIGHT KNEE - 1-2 VIEW COMPARISON:  None. FINDINGS: No evidence of fracture,  dislocation, or joint effusion. Moderate narrowing of patellofemoral space is noted. Chondrocalcinosis is noted in the medial and lateral joint spaces. Soft tissues are unremarkable. IMPRESSION: Chondrocalcinosis is noted in the medial and lateral joint spaces suggesting calcium pyrophosphate deposition disease or early degenerative joint disease. Moderate degenerative changes seen involving patellofemoral space. No acute abnormality is noted. Electronically Signed   By: Marijo Conception M.D.   On: 08/05/2021 15:41   VAS [place] LOWER EXTREMITY VENOUS (DVT)  Result Date: 08/05/2021  Lower Venous DVT Study Patient Name:  VELTA ROCKHOLT  Date of Exam:   08/05/2021 Medical Rec #: [id number]          Accession #:    [id number] Date of Birth: 04-25-1951          Patient Gender: F Patient Age:   20 years Exam Location[facility]  [facility] Procedure:      VAS [place] LOWER EXTREMITY VENOUS (DVT) Referring Phys: Oren Binet --------------------------------------------------------------------------------  Indications: Swelling, pain in knees RT>LT.  Anticoagulation: Lovenox. Limitations: Body habitus. Comparison Study: No prior studies. Performing Technologist: Darlin Coco RDMS, RVT  Examination Guidelines: A complete evaluation includes B-mode imaging, spectral Doppler, color Doppler, and power Doppler as needed of all accessible portions of each vessel. Bilateral testing is considered an integral part of a complete examination. Limited examinations for reoccurring indications may be performed as noted. The reflux portion of the exam is performed with the patient in reverse Trendelenburg.  +---------+---------------+---------+-----------+---------------+--------------+ RIGHT    CompressibilityPhasicitySpontaneityProperties     Thrombus Aging +---------+---------------+---------+-----------+---------------+--------------+ CFV      Full           Yes      Yes                                       +---------+---------------+---------+-----------+---------------+--------------+  SFJ      Full                                                             +---------+---------------+---------+-----------+---------------+--------------+ FV Prox  Full                                                             +---------+---------------+---------+-----------+---------------+--------------+ FV Mid   Full                                                             +---------+---------------+---------+-----------+---------------+--------------+ FV DistalFull                               Duplicated -                                                              both patent                   +---------+---------------+---------+-----------+---------------+--------------+ PFV      Full                                                             +---------+---------------+---------+-----------+---------------+--------------+ POP      Full           Yes      Yes                                      +---------+---------------+---------+-----------+---------------+--------------+ PTV      Full                                                             +---------+---------------+---------+-----------+---------------+--------------+ PERO     Full                                                             +---------+---------------+---------+-----------+---------------+--------------+ Gastroc  Full                                                             +---------+---------------+---------+-----------+---------------+--------------+   +---------+---------------+---------+-----------+----------+--------------+  LEFT     CompressibilityPhasicitySpontaneityPropertiesThrombus Aging +---------+---------------+---------+-----------+----------+--------------+ CFV      Full           Yes      Yes                                  +---------+---------------+---------+-----------+----------+--------------+ SFJ      Full                                                        +---------+---------------+---------+-----------+----------+--------------+ FV Prox  Full                                                        +---------+---------------+---------+-----------+----------+--------------+ FV Mid   Full                                                        +---------+---------------+---------+-----------+----------+--------------+ FV DistalFull                                                        +---------+---------------+---------+-----------+----------+--------------+ PFV      Full                                                        +---------+---------------+---------+-----------+----------+--------------+ POP      Full           Yes      Yes                                 +---------+---------------+---------+-----------+----------+--------------+ PTV      Full                                                        +---------+---------------+---------+-----------+----------+--------------+ PERO     Full                                                        +---------+---------------+---------+-----------+----------+--------------+ Gastroc  Full                                                        +---------+---------------+---------+-----------+----------+--------------+  Summary: RIGHT: - There is no evidence of deep vein thrombosis in the lower extremity.  - No cystic structure found in the popliteal fossa.  LEFT: - There is no evidence of deep vein thrombosis in the lower extremity.  - No cystic structure found in the popliteal fossa.  *See table(s) above for measurements and observations. Electronically signed by Orlie Pollen on 08/05/2021 at 7:04:10 PM.    Final      LOS: 1 day   Oren Binet, MD  [facility]    To contact the attending  provider between 7A-7P or the covering provider during after hours 7P-7A, please log into the web site [email or web address] and access using universal [place] password for that web site. If you do not have the password, please call the hospital operator.  08/06/2021, 10:00 AM

## 2021-08-06 NOTE — Telephone Encounter (Signed)
Sch per 11/16 inbasket, pt aware, left msg with pt daughter

## 2021-08-06 NOTE — Evaluation (Signed)
Occupational Therapy Evaluation Patient Details Name: Cheryl Blankenship MRN: [id number] DOB: October 28, 1950 Today's Date: 08/06/2021   History of Present Illness Pt is a 70 y/o female admitted secnodary to hypoglycemia. Also found to have RLL PNA and now with RLE knee pain (s/p right knee aspiration and injection 11/18). PMH includes CHF, DM, HTN, breast cancer, and parkinson's disease.   Clinical Impression   [name] admitted and underwent above presents to acute OT with PLOF of being able to do her own basic ADLs . Currently she is setup-total A +2 for basic ADLs. She will continue to benefit from acute OT with follow up at SNF.      Recommendations for follow up therapy are one component of a multi-disciplinary discharge planning process, led by the attending physician.  Recommendations may be updated based on patient status, additional functional criteria and insurance authorization.   Follow Up Recommendations  Skilled nursing-short term rehab (<3 hours/day)    Assistance Recommended at Discharge Frequent or constant Supervision/Assistance  Functional Status Assessment  Patient has had a recent decline in their functional status and demonstrates the ability to make significant improvements in function in a reasonable and predictable amount of time.  Equipment Recommendations  None recommended by OT       Precautions / Restrictions Precautions Precautions: Fall Restrictions Weight Bearing Restrictions: No      Mobility Bed Mobility Overal bed mobility: Needs Assistance Bed Mobility: Supine to Sit     Supine to sit: Mod assist;HOB elevated     General bed mobility comments: A for leg all the way through getting to EOB, VCs for sequencing    Transfers Overall transfer level: Needs assistance   Transfers: Sit to/from Stand             General transfer comment: Mod A +2 sit<>stand (from raised bed with Cheryl Blankenship and could only achieve enough stand to get  seat under her)      Balance Overall balance assessment: Needs assistance Sitting-balance support: No upper extremity supported;Feet supported Sitting balance-Leahy Scale: Good     Standing balance support: Bilateral upper extremity supported;During functional activity Standing balance-Leahy Scale: Poor                             ADL either performed or assessed with clinical judgement   ADL Overall ADL's : Needs assistance/impaired Eating/Feeding: Independent;Sitting   Grooming: Set up;Sitting   Upper Body Bathing: Minimal assistance;Sitting   Lower Body Bathing: Total assistance Lower Body Bathing Details (indicate cue type and reason): Mod A +2 sit<>stand (from raised bed with Cheryl Blankenship and could only achieve enough stand to get seat under her) Upper Body Dressing : Minimal assistance;Sitting   Lower Body Dressing: Total assistance Lower Body Dressing Details (indicate cue type and reason): Mod A +2 sit<>stand (from raised bed with Cheryl Blankenship and could only achieve enough stand to get seat under her)                     Vision Patient Visual Report: No change from baseline              Pertinent Vitals/Pain Pain Assessment: 0-10 Faces Pain Scale: Hurts worst Pain Location: R knee Pain Descriptors / Indicators: Grimacing;Guarding;Moaning (with ROM  and  WB'ing) Pain Intervention(s): Limited activity within patient's tolerance;Monitored during session;Premedicated before session;Ice applied     Hand Dominance  right   Extremity/Trunk  Assessment Upper Extremity Assessment Upper Extremity Assessment: Generalized weakness              Cognition Arousal/Alertness: Awake/alert Behavior During Therapy: WFL for tasks assessed/performed Overall Cognitive Status: No family/caregiver present to determine baseline cognitive functioning                                 General Comments: Has difficulty following some commands and  train of thought                Home Living Family/patient expects to be discharged to:: Skilled nursing facility                                                OT Problem List: Decreased strength;Decreased range of motion;Impaired balance (sitting and/or standing);Pain      OT Treatment/Interventions: Self-care/ADL training;DME and/or AE instruction;Patient/family education;Balance training    OT Goals(Current goals can be found in the care plan section) Acute Rehab OT Goals Patient Stated Goal: to be home by Thanksgiving OT Goal Formulation: With patient Time For Goal Achievement: 08/20/21 Potential to Achieve Goals: Good  OT Frequency: Min 2X/week              AM-PAC OT "6 Clicks" Daily Activity     Outcome Measure Help from another person eating meals?: None Help from another person taking care of personal grooming?: A Little Help from another person toileting, which includes using toliet, bedpan, or urinal?: Total Help from another person bathing (including washing, rinsing, drying)?: A Lot Help from another person to put on and taking off regular upper body clothing?: A Little Help from another person to put on and taking off regular lower body clothing?: Total 6 Click Score: 14   End of Session Equipment Utilized During Treatment: Gait belt Nurse Communication: Mobility status;Need for lift equipment (maxi move for back to bed)  Activity Tolerance: Patient limited by pain Patient left: in chair;with call bell/phone within reach;with chair alarm set  OT Visit Diagnosis: Unsteadiness on feet (R26.81);Other abnormalities of gait and mobility (R26.89);Muscle weakness (generalized) (M62.81);Pain Pain - Right/Left: Right Pain - part of body: Knee                Time: 1240-1329 OT Time Calculation (min): 49 min Charges:  OT General Charges $OT Visit: 1 Visit OT Evaluation $OT Eval Moderate Complexity: 1 Mod OT Treatments $Self Care/Home  Management : 23-37 mins Cheryl Blankenship, OTR/L Acute [employer] Pager [phone] Office [phone]    Almon Register 08/06/2021, 4:21 PM

## 2021-08-06 NOTE — Procedures (Signed)
Procedure: Right knee aspiration and injection   Indication: Right knee effusion(s)   Surgeon: Silvestre Gunner, PA-C   Assist: None   Anesthesia: Topical refrigerant   EBL: None   Complications: None   Findings: After risks/benefits explained patient desires to undergo procedure. Consent obtained and time out performed. The right knee was sterilely prepped and aspirated. 39ml bloody fluid obtained. 68ml 0.5% Marcaine and 40mg  depo-medrol instilled. Pt tolerated the procedure well.       Lisette Abu, PA-C Orthopedic Surgery [phone]

## 2021-08-06 NOTE — Consult Note (Signed)
ORTHOPAEDIC CONSULTATION  REQUESTING PHYSICIAN: Jonetta Osgood, MD  Chief Complaint: right knee pain  HPI: Cheryl Blankenship is a 70 y.o. female who presents with right knee pain.  She is currently admitted to the hospital with a pneumonia as well as a period of hypoglycemia.  On the hospital she endorses increased right knee pain.  X-rays were consistent with chondrocalcinosis consistent with pseudogout.  She has not previously been treated for this.  Past Medical History:  Diagnosis Date   Cancer ([place])    Congestive heart failure (CHF) (HCC)    Diabetes mellitus without complication (HCC)    High cholesterol    Hypertension    Past Surgical History:  Procedure Laterality Date   BREAST SURGERY     Social History   Socioeconomic History   Marital status: Married    Spouse name: Not on file   Number of children: Not on file   Years of education: Not on file   Highest education level: Not on file  Occupational History   Not on file  Tobacco Use   Smoking status: Never   Smokeless tobacco: Never  Vaping Use   Vaping Use: Never used  Substance and Sexual Activity   Alcohol use: Never   Drug use: Never   Sexual activity: Not on file  Other Topics Concern   Not on file  Social History Narrative   Not on file   Social Determinants of Health   Financial Resource Strain: Not on file  Food Insecurity: Not on file  Transportation Needs: Not on file  Physical Activity: Not on file  Stress: Not on file  Social Connections: Not on file   Family History  Family history unknown: Yes   - negative except otherwise stated in the family history section Allergies  Allergen Reactions   Coconut (Cocos Nucifera) Allergy Skin Test    Hydrochlorothiazide Swelling   Other     Pt is allergic to a pain medication. Unsure of the name   Lisinopril Rash   Prior to Admission medications   Medication Sig Start Date End Date Taking? Authorizing Provider  acetaminophen  (TYLENOL) 500 MG tablet Take 500 mg by mouth every 6 (six) hours as needed for moderate pain or headache.   Yes [provider]  albuterol (VENTOLIN HFA) 108 (90 Base) MCG/ACT inhaler Inhale 1 puff into the lungs every 4 (four) hours as needed for wheezing or shortness of breath.   Yes [provider]  ALPRAZolam (XANAX) 0.25 MG tablet Take 0.25-0.5 mg by mouth 3 (three) times daily as needed for anxiety.   Yes [provider]  amLODipine (NORVASC) 10 MG tablet Take 10 mg by mouth daily.   Yes [provider]  aspirin 81 MG chewable tablet Chew 81 mg by mouth daily.   Yes [provider]  carvedilol (COREG) 25 MG tablet Take 25 mg by mouth 2 (two) times daily with a meal.   Yes [provider]  cholecalciferol (VITAMIN D) 25 MCG (1000 UNIT) tablet Take 1,000 Units by mouth at bedtime.   Yes [provider]  cycloSPORINE (RESTASIS) 0.05 % ophthalmic emulsion Place 1 drop into both eyes 2 (two) times daily.   Yes [provider]  diphenoxylate-atropine (LOMOTIL) 2.5-0.025 MG tablet Take 1-2 tablets by mouth 4 (four) times daily as needed for diarrhea or loose stools.   Yes [provider]  DULoxetine (CYMBALTA) 30 MG capsule Take 30 mg by mouth daily.   Yes  [provider]  exemestane (AROMASIN) 25 MG tablet Take 25 mg by mouth at bedtime.   Yes [provider]  folic acid (FOLVITE) 1 MG tablet Take 1 mg by mouth every evening.   Yes [provider]  furosemide (LASIX) 20 MG tablet Take 20 mg by mouth daily as needed (swelling/weight gain).   Yes [provider]  gabapentin (NEURONTIN) 400 MG capsule Take 400 mg by mouth daily.   Yes [provider]  glipiZIDE (GLUCOTROL XL) 10 MG 24 hr tablet Take 10 mg by mouth daily with breakfast.   Yes [provider]  Insulin Aspart (NOVOLOG FLEXPEN [place]) Inject 0-10 Units into the skin with breakfast, with lunch, and with evening  meal. 0-150 = 0 units 150-199 = 2 units 200-249 = 4 units 250-299 = 6 units 300-349 = 8 units Greater than 350 = 10 units   Yes [provider]  insulin glargine (LANTUS) 100 UNIT/ML injection Inject 70 Units into the skin 2 (two) times daily.   Yes [provider]  lactulose (CHRONULAC) 10 GM/15ML solution Take 30 g by mouth daily.   Yes [provider]  losartan (COZAAR) 100 MG tablet Take 100 mg by mouth daily.   Yes [provider]  methocarbamol (ROBAXIN) 500 MG tablet Take 500 mg by mouth every 8 (eight) hours as needed for muscle spasms.   Yes [provider]  metoprolol tartrate (LOPRESSOR) 50 MG tablet Take 50 mg by mouth 2 (two) times daily.   Yes [provider]  omeprazole (PRILOSEC) 20 MG capsule Take 20 mg by mouth daily.   Yes [provider]  ondansetron (ZOFRAN-ODT) 4 MG disintegrating tablet Take 4 mg by mouth 2 (two) times daily as needed for nausea or vomiting.   Yes [provider]  potassium chloride (MICRO-K) 10 MEQ CR capsule Take 10 mEq by mouth daily.   Yes [provider]  pregabalin (LYRICA) 75 MG capsule Take 75 mg by mouth at bedtime.   Yes [provider]  promethazine (PHENERGAN) 25 MG suppository Place 25 mg rectally every 8 (eight) hours as needed for nausea or vomiting.   Yes [provider]  rOPINIRole (REQUIP) 0.25 MG tablet Take 0.25 mg by mouth 3 (three) times daily.   Yes [provider]  rosuvastatin (CRESTOR) 5 MG tablet Take 5 mg by mouth at bedtime.   Yes [provider]  spironolactone (ALDACTONE) 25 MG tablet Take 25 mg by mouth daily.   Yes [provider]  torsemide (DEMADEX) 20 MG tablet Take 40 mg by mouth 2 (two) times daily.   Yes [provider]  vitamin B-12 (CYANOCOBALAMIN) 1000 MCG tablet Take 1,000 mcg by mouth every evening.   Yes [provider]  zolpidem (AMBIEN) 5 MG tablet Take 5 mg by mouth  at bedtime.   Yes [provider]   DG Knee 1-2 Views Right  Result Date: 08/05/2021 CLINICAL DATA:  Right knee pain. EXAM: RIGHT KNEE - 1-2 VIEW COMPARISON:  None. FINDINGS: No evidence of fracture, dislocation, or joint effusion. Moderate narrowing of patellofemoral space is noted. Chondrocalcinosis is noted in the medial and lateral joint spaces. Soft tissues are unremarkable. IMPRESSION: Chondrocalcinosis is noted in the medial and lateral joint spaces suggesting calcium pyrophosphate deposition disease or early degenerative joint disease. Moderate degenerative changes seen involving patellofemoral space. No acute abnormality is noted. Electronically Signed   By: Marijo Conception M.D.   On: 08/05/2021 15:41  VAS [place] LOWER EXTREMITY VENOUS (DVT)  Result Date: 08/05/2021  Lower Venous DVT Study Patient Name:  Cheryl Blankenship  Date of Exam:   08/05/2021 Medical Rec #: [id number]          Accession #:    [id number] Date of Birth: 12-19-1950          Patient Gender: F Patient Age:   41 years Exam Location:  [facility] Procedure:      VAS [place] LOWER EXTREMITY VENOUS (DVT) Referring Phys: Oren Binet --------------------------------------------------------------------------------  Indications: Swelling, pain in knees RT>LT.  Anticoagulation: Lovenox. Limitations: Body habitus. Comparison Study: No prior studies. Performing Technologist: Darlin Coco RDMS, RVT  Examination Guidelines: A complete evaluation includes B-mode imaging, spectral Doppler, color Doppler, and power Doppler as needed of all accessible portions of each vessel. Bilateral testing is considered an integral part of a complete examination. Limited examinations for reoccurring indications may be performed as noted. The reflux portion of the exam is performed with the patient in reverse Trendelenburg.  +---------+---------------+---------+-----------+---------------+--------------+ RIGHT     CompressibilityPhasicitySpontaneityProperties     Thrombus Aging +---------+---------------+---------+-----------+---------------+--------------+ CFV      Full           Yes      Yes                                      +---------+---------------+---------+-----------+---------------+--------------+ SFJ      Full                                                             +---------+---------------+---------+-----------+---------------+--------------+ FV Prox  Full                                                             +---------+---------------+---------+-----------+---------------+--------------+ FV Mid   Full                                                             +---------+---------------+---------+-----------+---------------+--------------+ FV DistalFull                               Duplicated -                                                              both patent                   +---------+---------------+---------+-----------+---------------+--------------+ PFV      Full                                                             +---------+---------------+---------+-----------+---------------+--------------+  POP      Full           Yes      Yes                                      +---------+---------------+---------+-----------+---------------+--------------+ PTV      Full                                                             +---------+---------------+---------+-----------+---------------+--------------+ PERO     Full                                                             +---------+---------------+---------+-----------+---------------+--------------+ Gastroc  Full                                                             +---------+---------------+---------+-----------+---------------+--------------+   +---------+---------------+---------+-----------+----------+--------------+ LEFT      CompressibilityPhasicitySpontaneityPropertiesThrombus Aging +---------+---------------+---------+-----------+----------+--------------+ CFV      Full           Yes      Yes                                 +---------+---------------+---------+-----------+----------+--------------+ SFJ      Full                                                        +---------+---------------+---------+-----------+----------+--------------+ FV Prox  Full                                                        +---------+---------------+---------+-----------+----------+--------------+ FV Mid   Full                                                        +---------+---------------+---------+-----------+----------+--------------+ FV DistalFull                                                        +---------+---------------+---------+-----------+----------+--------------+ PFV      Full                                                        +---------+---------------+---------+-----------+----------+--------------+  POP      Full           Yes      Yes                                 +---------+---------------+---------+-----------+----------+--------------+ PTV      Full                                                        +---------+---------------+---------+-----------+----------+--------------+ PERO     Full                                                        +---------+---------------+---------+-----------+----------+--------------+ Gastroc  Full                                                        +---------+---------------+---------+-----------+----------+--------------+     Summary: RIGHT: - There is no evidence of deep vein thrombosis in the lower extremity.  - No cystic structure found in the popliteal fossa.  LEFT: - There is no evidence of deep vein thrombosis in the lower extremity.  - No cystic structure found in the popliteal fossa.  *See  table(s) above for measurements and observations. Electronically signed by Orlie Pollen on 08/05/2021 at 7:04:10 PM.    Final      Positive ROS: All other systems have been reviewed and were otherwise negative with the exception of those mentioned in the HPI and as above.  Physical Exam: General: No acute distress Cardiovascular: No pedal edema Respiratory: No cyanosis, no use of accessory musculature GI: No organomegaly, abdomen is soft and non-tender Skin: No lesions in the area of chief complaint Neurologic: Sensation intact distally Psychiatric: Patient is at baseline mood and affect Lymphatic: No axillary or cervical lymphadenopathy  MUSCULOSKELETAL:  Right knee is not red or erythematous.  There is trace swelling.  She has range of motion from 0 to 35 degrees without significant pain 2+ DP pulse  Independent Imaging Review: Right knee with evidence of chondrocalcinosis suggestive of pseudogout on x-rays  Assessment: 70 year old female with right knee pain consistent with pseudogout.  Right knee aspirated and sent for crystal as well as synovial analysis.  We will follow the cultures of the right knee.  In the meantime steroid has been placed to treat the right knee.  Plan: Recommend rheumatology follow-up given the fact that she does have evidence of pseudogout on x-ray.  Thank you for the consult and the opportunity to see Ms. Ferman Hamming, MD [facility] 4:40 PM

## 2021-08-06 NOTE — Progress Notes (Signed)
Hypoglycemic Event  CBG: 54  Treatment: 8 oz juice/soda  Symptoms: Shaky  Follow-up CBG: [id number]  CBG Result:77  Possible Reasons for Event: Inadequate meal intake  Comments/MD notified:Dr. Sloan Leiter MD    Burnell Blanks

## 2021-08-07 DIAGNOSIS — G20 Parkinson's disease: Secondary | ICD-10-CM | POA: Diagnosis not present

## 2021-08-07 DIAGNOSIS — R52 Pain, unspecified: Secondary | ICD-10-CM | POA: Diagnosis not present

## 2021-08-07 DIAGNOSIS — J189 Pneumonia, unspecified organism: Secondary | ICD-10-CM | POA: Diagnosis not present

## 2021-08-07 DIAGNOSIS — E162 Hypoglycemia, unspecified: Secondary | ICD-10-CM | POA: Diagnosis not present

## 2021-08-07 LAB — GLUCOSE, CAPILLARY
Glucose-Capillary: 308 mg/dL — ABNORMAL HIGH (ref 70–99)
Glucose-Capillary: 341 mg/dL — ABNORMAL HIGH (ref 70–99)
Glucose-Capillary: 375 mg/dL — ABNORMAL HIGH (ref 70–99)
Glucose-Capillary: 357 mg/dL — ABNORMAL HIGH (ref 70–99)

## 2021-08-07 LAB — BASIC METABOLIC PANEL
Anion gap: 6 (ref 5–15)
BUN: 26 mg/dL — ABNORMAL HIGH (ref 8–23)
CO2: 27 mmol/L (ref 22–32)
Calcium: 10.2 mg/dL (ref 8.9–10.3)
Chloride: 102 mmol/L (ref 98–111)
Creatinine, Ser: 1.2 mg/dL — ABNORMAL HIGH (ref 0.44–1.00)
GFR, Estimated: 50 mL/min — ABNORMAL LOW (ref >60)
Glucose, Bld: 315 mg/dL — ABNORMAL HIGH (ref 70–99)
Potassium: 5 mmol/L (ref 3.5–5.1)
Sodium: 135 mmol/L (ref 135–145)

## 2021-08-07 MED ORDER — INSULIN GLARGINE-YFGN 100 UNIT/ML ~~LOC~~ SOLN
32.0000 [IU] | Freq: Every day | SUBCUTANEOUS | Status: DC
  Administered 2021-08-07 – 2021-08-09 (×3): 32 [IU] via SUBCUTANEOUS
  Filled 2021-08-07 (×3): qty 0.32

## 2021-08-07 MED ORDER — SODIUM CHLORIDE 0.9 % IV SOLN: INTRAVENOUS | Status: AC

## 2021-08-07 MED ORDER — LOSARTAN POTASSIUM 50 MG PO TABS
100.0000 mg | ORAL_TABLET | Freq: Every day | ORAL | Status: DC
  Administered 2021-08-08 – 2021-08-09 (×2): 100 mg via ORAL
  Filled 2021-08-07 (×2): qty 2

## 2021-08-07 MED ORDER — INSULIN ASPART 100 UNIT/ML IJ SOLN
4.0000 [IU] | Freq: Three times a day (TID) | INTRAMUSCULAR | Status: DC
  Administered 2021-08-07 – 2021-08-09 (×6): 4 [IU] via SUBCUTANEOUS

## 2021-08-07 MED ORDER — INSULIN GLARGINE-YFGN 100 UNIT/ML ~~LOC~~ SOLN
26.0000 [IU] | Freq: Every day | SUBCUTANEOUS | Status: DC
  Administered 2021-08-07 – 2021-08-08 (×2): 26 [IU] via SUBCUTANEOUS
  Filled 2021-08-07 (×3): qty 0.26

## 2021-08-07 NOTE — Progress Notes (Signed)
Physical Therapy Treatment Patient Details Name: Cheryl Blankenship MRN: [id number] DOB: 10-21-1950 Today's Date: 08/07/2021   History of Present Illness Pt is a 70 y/o female admitted due to hypoglycemia and RLL PNA. Pt with Rt knee pain (s/p right knee aspiration and injection 11/18). PMH includes CHF, DM, HTN, breast cancer, and parkinson's disease.    PT Comments    Pt pleasant with noted cognitive deficits but improved mobility and gait tolerance. Pt min assist to stand and ambulate with RW. Pt educated for HEP as well as encouraged to be OOB for toileting and not use purewick during the day to return to PLOF. Son present during session and reports he, other son and church members will be able to provide 24hr assist at D/C with pt stating desire to return home. Will continue to follow.     Recommendations for follow up therapy are one component of a multi-disciplinary discharge planning process, led by the attending physician.  Recommendations may be updated based on patient status, additional functional criteria and insurance authorization.  Follow Up Recommendations  Home health PT     Assistance Recommended at Discharge Frequent or constant Supervision/Assistance  Equipment Recommendations  BSC/3in1    Recommendations for Other Services       Precautions / Restrictions Precautions Precautions: Fall Precaution Comments: R knee pain Restrictions Weight Bearing Restrictions: No     Mobility  Bed Mobility Overal bed mobility: Needs Assistance Bed Mobility: Supine to Sit     Supine to sit: Supervision;HOB elevated     General bed mobility comments: pt in chair on arrival and end of session    Transfers Overall transfer level: Needs assistance Equipment used: Rolling walker (2 wheels) Transfers: Sit to/from Stand Sit to Stand: Min assist           General transfer comment: cues for hand placement with assist to rise from surface and lack of command following  to reach for surface to sit    Ambulation/Gait Ambulation/Gait assistance: Min guard Gait Distance (Feet): 110 Feet Assistive device: Rolling walker (2 wheels) Gait Pattern/deviations: Step-through pattern;Decreased stride length   Gait velocity interpretation: <1.8 ft/sec, indicate of risk for recurrent falls   General Gait Details: cue for posture and proximity to RW, pt able to recall room number   Stairs             Wheelchair Mobility    Modified Rankin (Stroke Patients Only)       Balance Overall balance assessment: Needs assistance Sitting-balance support: No upper extremity supported;Feet supported Sitting balance-Leahy Scale: Fair     Standing balance support: Reliant on assistive device for balance;Bilateral upper extremity supported Standing balance-Leahy Scale: Poor Standing balance comment: Reliant on BUE support on RW                            Cognition Arousal/Alertness: Awake/alert Behavior During Therapy: WFL for tasks assessed/performed Overall Cognitive Status: Impaired/Different from baseline Area of Impairment: Orientation;Memory;Attention;Following commands                 Orientation Level: Time Current Attention Level: Sustained Memory: Decreased short-term memory Following Commands: Follows one step commands consistently;Follows one step commands with increased time Safety/Judgement: Decreased awareness of deficits;Decreased awareness of safety   Problem Solving: Slow processing;Requires verbal cues;Requires tactile cues General Comments: pt with decreased attention, impaired memory, perseverating on wanting WC which she does not currently require, pt unable to spell world backward  Exercises General Exercises - Lower Extremity Long Arc Quad: AROM;Both;Seated;15 reps Hip Flexion/Marching: AROM;Both;Seated;15 reps    General Comments        Pertinent Vitals/Pain Pain Assessment: No/denies pain Faces  Pain Scale: Hurts even more Pain Location: R knee Pain Descriptors / Indicators: Grimacing;Guarding;Moaning Pain Intervention(s): Monitored during session    Home Living                          Prior Function            PT Goals (current goals can now be found in the care plan section) Progress towards PT goals: Progressing toward goals    Frequency    Min 3X/week      PT Plan Discharge plan needs to be updated    Co-evaluation              AM-PAC PT "6 Clicks" Mobility   Outcome Measure  Help needed turning from your back to your side while in a flat bed without using bedrails?: A Little Help needed moving from lying on your back to sitting on the side of a flat bed without using bedrails?: A Little Help needed moving to and from a bed to a chair (including a wheelchair)?: A Little Help needed standing up from a chair using your arms (e.g., wheelchair or bedside chair)?: A Little Help needed to walk in hospital room?: A Little Help needed climbing 3-5 steps with a railing? : A Lot 6 Click Score: 17    End of Session   Activity Tolerance: Patient tolerated treatment well Patient left: in chair;with call bell/phone within reach;with chair alarm set Nurse Communication: Mobility status PT Visit Diagnosis: Unsteadiness on feet (R26.81);Muscle weakness (generalized) (M62.81);Difficulty in walking, not elsewhere classified (R26.2)     Time: 4196-2229 PT Time Calculation (min) (ACUTE ONLY): 28 min  Charges:  $Gait Training: 8-22 mins $Therapeutic Exercise: 8-22 mins                     [place], PT Acute Rehabilitation Services Pager: [phone] Office: [place] 08/07/2021, 1:29 PM

## 2021-08-07 NOTE — Progress Notes (Addendum)
PROGRESS NOTE        PATIENT DETAILS Name: Cheryl Blankenship Age: 70 y.o. Sex: female Date of Birth: [birth year] Admit Date: 08/03/2021 Admitting Physician Evalee Mutton Kristeen Mans, MD DJT:TSVXB, Rachel Moulds, NP  Brief Narrative: Patient is a 70 y.o. female with history of DM-2, HFpEF, HTN, breast cancer, Parkinson's disease-admitted for altered mental status due to hypoglycemia.  She was managed with supportive care-subsequently hypoglycemia resolved-however she developed acute right knee pain.  See below for further details.  Subjective: Right knee pain has improved following intra-articular steroid administration on 11/18.  She has not yet ambulated post arthrocentesis.  Objective: Vitals: Blood pressure (!) 141/69, pulse 69, temperature 98.5 F (36.9 C), resp. rate 18, height 5\' 3"  (1.6 m), weight 105.6 kg, SpO2 93 %.   Exam: Gen Exam:Alert awake-not in any distress HEENT:atraumatic, normocephalic Chest: B/L clear to auscultation anteriorly CVS:S1S2 regular Abdomen:soft non tender, non distended Extremities:no edema.  Right knee-significantly less tender than the past few days. Neurology: Non focal Skin: no rash   Pertinent Labs/Radiology: Recent Labs  Lab 08/03/21 2124 08/04/21 0256 08/06/21 0402 08/07/21 0323  WBC 6.2 7.0 5.0  --   HGB 12.1 11.7* 10.0*  --   PLT 240 250 230  --   NA 142 136 138 135  K 4.6 4.2 4.1 5.0  CREATININE 0.92 0.82 1.12* 1.20*  AST 18 23 10*  --   ALT 14 16 13   --   ALKPHOS 62 61 49  --   BILITOT 0.6 0.7 0.5  --      11/16>>Blood culture: No growth 11/18>> right knee synovial fluid: No growth.  No crystals seen.  WBC 5400.  11/15>>CXR: Patchy airspace disease in the right lower lung area. 11/17>> bilateral lower extremity Doppler: Negative for DVT.  Assessment/Plan: Hypoglycemia: Due to patient starting intermittent fasting/missing meals while still taking Lantus/NovoLog and Glucotrol XL.  Extensive counseling  regarding importance of not missing meals-and letting primary care practitioner know beforehand if she was to begin fasting has been completed.  She has been started on Lantus insulin-see below.    Recent Labs    08/06/21 1609 08/06/21 2105 08/07/21 0646  GLUCAP 227* 312* 308*     DM-2 (A1c 8.4): CBGs now on the higher side (had hypoglycemia yesterda am)-have increased Semglee to 32 units in a.m. and 26 units in p.m.  Added 4 units of NovoLog with meals.  Continue SSI.  Follow and adjust.  Right lower lobe PNA: Suspect aspiration-continue Rocephin x5 days-okay to stop Zithromax x3 days.    Right knee arthralgia: Likely due to pseudogout-however no crystals seen-intra-articular steroids injected on 11/18 with significant improvement in pain.  Synovial fluid cultures negative so far.  Ambulate with PT and see how she does.    AKI: Mild-we will hydrate with IVF-recheck electrolytes tomorrow.  Hold losartan today.  HFpEF: Compensated-resume diuretics once blood pressure improves a bit more.  HTN: BP stable-continue Coreg/amlodipine -held losartan today-should be okay to resume tomorrow.    HLD: Continue statin  Parkinson's disease: Stable-continue Requip  History of breast cancer-s/p left mastectomy: Appreciate oncology follow-up-Aromasin held due to right knee arthritis/arthralgia.   Peripheral neuropathy: Continue Neurontin/Lyrica/Cymbalta  Obesity Estimated body mass index is 41.24 kg/m as calculated from the following:   Height as of this encounter: 5\' 3"  (1.6 m).   Weight as of this encounter: 105.6  kg.     Procedures: None Consults: None DVT Prophylaxis: Lovenox Code Status:Full code  Family Communication: Son at bedside.  Time spent: 25 minutes-Greater than 50% of this time was spent in counseling, explanation of diagnosis, planning of further management, and coordination of care.  Disposition Plan: Status is: Inpatient  Remain inpatient-not yet stable for  discharge given severity of right knee pain.  Unable to bear weight.  Evaluated by PT with recommendations for SNF.  Diet: Diet Order             Diet heart healthy/carb modified Room service appropriate? Yes; Fluid consistency: Thin; Fluid restriction: 1200 mL Fluid  Diet effective now                     Antimicrobial agents: Anti-infectives (From admission, onward)    Start     Dose/Rate Route Frequency Ordered Stop   08/04/21 2300  azithromycin (ZITHROMAX) 500 mg in sodium chloride 0.9 % 250 mL IVPB  Status:  Discontinued        500 mg 250 mL/hr over 60 Minutes Intravenous Every 24 hours 08/04/21 0156 08/06/21 1006   08/04/21 2200  cefTRIAXone (ROCEPHIN) 2 g in sodium chloride 0.9 % 100 mL IVPB        2 g 200 mL/hr over 30 Minutes Intravenous Every 24 hours 08/04/21 0156 08/09/21 2159   08/03/21 2315  cefTRIAXone (ROCEPHIN) 1 g in sodium chloride 0.9 % 100 mL IVPB        1 g 200 mL/hr over 30 Minutes Intravenous  Once 08/03/21 2310 08/04/21 0125   08/03/21 2315  azithromycin (ZITHROMAX) 500 mg in sodium chloride 0.9 % 250 mL IVPB        500 mg 250 mL/hr over 60 Minutes Intravenous  Once 08/03/21 2310 08/04/21 0222        MEDICATIONS: Scheduled Meds:  amLODipine  10 mg Oral Daily   aspirin  81 mg Oral Daily   carvedilol  25 mg Oral BID WC   cycloSPORINE  1 drop Both Eyes BID   DULoxetine  30 mg Oral Daily   enoxaparin (LOVENOX) injection  40 mg Subcutaneous X32T   folic acid  1 mg Oral QPM   gabapentin  400 mg Oral q morning   insulin aspart  0-9 Units Subcutaneous TID AC & HS   insulin aspart  4 Units Subcutaneous TID WC   insulin glargine-yfgn  26 Units Subcutaneous QHS   insulin glargine-yfgn  32 Units Subcutaneous Daily   living well with diabetes book   Does not apply Once   [START ON 08/08/2021] losartan  100 mg Oral Daily   pregabalin  75 mg Oral QHS   rOPINIRole  0.25 mg Oral TID   rosuvastatin  5 mg Oral QHS   sodium chloride flush  3 mL Intravenous  Q12H   vitamin B-12  1,000 mcg Oral QPM   Continuous Infusions:  sodium chloride     sodium chloride 75 mL/hr at 08/07/21 1017   cefTRIAXone (ROCEPHIN)  IV Stopped (08/06/21 2300)   PRN Meds:.sodium chloride, acetaminophen **OR** acetaminophen, albuterol, ALPRAZolam, hydrALAZINE, methocarbamol, oxyCODONE-acetaminophen   I have personally reviewed following labs and imaging studies  LABORATORY DATA: CBC: Recent Labs  Lab 08/03/21 2124 08/04/21 0256 08/06/21 0402  WBC 6.2 7.0 5.0  NEUTROABS 4.4  --   --   HGB 12.1 11.7* 10.0*  HCT 38.5 37.9 31.7*  MCV 90.0 89.4 88.8  PLT 240 250 230  Basic Metabolic Panel: Recent Labs  Lab 08/03/21 2124 08/04/21 0256 08/06/21 0402 08/07/21 0323  NA 142 136 138 135  K 4.6 4.2 4.1 5.0  CL 108 106 105 102  CO2 26 22 28 27   GLUCOSE 71 146* 79 315*  BUN 17 17 20  26*  CREATININE 0.92 0.82 1.12* 1.20*  CALCIUM 10.7* 10.0 10.3 10.2     GFR: Estimated Creatinine Clearance: 52.9 mL/min (A) (by C-G formula based on SCr of 1.2 mg/dL (H)).  Liver Function Tests: Recent Labs  Lab 08/03/21 2124 08/04/21 0256 08/06/21 0402  AST 18 23 10*  ALT 14 16 13   ALKPHOS 62 61 49  BILITOT 0.6 0.7 0.5  PROT 7.5 7.2 6.4*  ALBUMIN 4.0 3.5 3.1*    No results for input(s): LIPASE, AMYLASE in the last 168 hours. No results for input(s): AMMONIA in the last 168 hours.  Coagulation Profile: Recent Labs  Lab 08/06/21 0402  INR 1.1     Cardiac Enzymes: No results for input(s): CKTOTAL, CKMB, CKMBINDEX, TROPONINI in the last 168 hours.  BNP (last 3 results) No results for input(s): PROBNP in the last 8760 hours.  Lipid Profile: No results for input(s): CHOL, HDL, LDLCALC, TRIG, CHOLHDL, LDLDIRECT in the last 72 hours.  Thyroid Function Tests: No results for input(s): TSH, T4TOTAL, FREET4, T3FREE, THYROIDAB in the last 72 hours.  Anemia Panel: No results for input(s): VITAMINB12, FOLATE, FERRITIN, TIBC, IRON, RETICCTPCT in the last  72 hours.  Urine analysis:    Component Value Date/Time   COLORURINE YELLOW 08/03/2021 2137   APPEARANCEUR HAZY (A) 08/03/2021 2137   LABSPEC 1.012 08/03/2021 2137   PHURINE 7.0 08/03/2021 2137   GLUCOSEU 50 (A) 08/03/2021 2137   HGBUR NEGATIVE 08/03/2021 2137   BILIRUBINUR NEGATIVE 08/03/2021 2137   KETONESUR NEGATIVE 08/03/2021 2137   PROTEINUR NEGATIVE 08/03/2021 2137   NITRITE NEGATIVE 08/03/2021 2137   LEUKOCYTESUR NEGATIVE 08/03/2021 2137    Sepsis Labs: Lactic Acid, Venous No results found for: LATICACIDVEN  MICROBIOLOGY: Recent Results (from the past 240 hour(s))  Blood culture (routine x 2)     Status: None (Preliminary result)   Collection Time: 08/04/21 12:50 AM   Specimen: BLOOD LEFT HAND  Result Value Ref Range Status   Specimen Description BLOOD LEFT HAND  Final   Special Requests   Final    BOTTLES DRAWN AEROBIC AND ANAEROBIC Blood Culture adequate volume   Culture   Final    NO GROWTH 2 DAYS Performed at [facility], [place] [address], [place], [place] [address]    Report Status PENDING  Incomplete  Resp Panel by RT-PCR (Flu A&B, Covid) Nasopharyngeal Swab     Status: None   Collection Time: 08/04/21 12:56 AM   Specimen: Nasopharyngeal Swab; Nasopharyngeal(NP) swabs in vial transport medium  Result Value Ref Range Status   SARS Coronavirus 2 by RT PCR NEGATIVE NEGATIVE Final    Comment: (NOTE) SARS-CoV-2 target nucleic acids are NOT DETECTED.  The SARS-CoV-2 RNA is generally detectable in upper respiratory specimens during the acute phase of infection. The lowest concentration of SARS-CoV-2 viral copies this assay can detect is 138 copies/mL. A negative result does not preclude SARS-Cov-2 infection and should not be used as the sole basis for treatment or other patient management decisions. A negative result may occur with  improper specimen collection/handling, submission of specimen other than nasopharyngeal swab, presence of viral mutation(s)  within the areas targeted by this assay, and inadequate number of viral copies(<138  copies/mL). A negative result must be combined with clinical observations, patient history, and epidemiological information. The expected result is Negative.  Fact Sheet for Patients:  EntrepreneurPulse.com.au  Fact Sheet for Healthcare Providers:  IncredibleEmployment.be  This test is no t yet approved or cleared by the [place] FDA and  has been authorized for detection and/or diagnosis of SARS-CoV-2 by FDA under an Emergency Use Authorization (EUA). This EUA will remain  in effect (meaning this test can be used) for the duration of the COVID-19 declaration under Section 564(b)(1) of the Act, 21 U.S.C.section 360bbb-3(b)(1), unless the authorization is terminated  or revoked sooner.       Influenza A by PCR NEGATIVE NEGATIVE Final   Influenza B by PCR NEGATIVE NEGATIVE Final    Comment: (NOTE) The Xpert Xpress SARS-CoV-2/FLU/RSV plus assay is intended as an aid in the diagnosis of influenza from Nasopharyngeal swab specimens and should not be used as a sole basis for treatment. Nasal washings and aspirates are unacceptable for Xpert Xpress SARS-CoV-2/FLU/RSV testing.  Fact Sheet for Patients: EntrepreneurPulse.com.au  Fact Sheet for Healthcare Providers: IncredibleEmployment.be  This test is not yet approved or cleared by the [place] FDA and has been authorized for detection and/or diagnosis of SARS-CoV-2 by FDA under an Emergency Use Authorization (EUA). This EUA will remain in effect (meaning this test can be used) for the duration of the COVID-19 declaration under Section 564(b)(1) of the Act, 21 U.S.C. section 360bbb-3(b)(1), unless the authorization is terminated or revoked.  Performed at [facility], [place] [address], [place], [place] [address]   Blood culture (routine x 2)     Status: None  (Preliminary result)   Collection Time: 08/04/21  1:23 AM   Specimen: BLOOD RIGHT HAND  Result Value Ref Range Status   Specimen Description BLOOD RIGHT HAND  Final   Special Requests   Final    BOTTLES DRAWN AEROBIC AND ANAEROBIC Blood Culture adequate volume   Culture   Final    NO GROWTH 2 DAYS Performed at [facility], [place] [address], [place], [place] [address]    Report Status PENDING  Incomplete  Body fluid culture w Gram Stain     Status: None (Preliminary result)   Collection Time: 08/06/21  9:41 AM   Specimen: KNEE; Body Fluid  Result Value Ref Range Status   Specimen Description KNEE  Final   Special Requests RIGHT  Final   Gram Stain   Final    ABUNDANT WBC PRESENT,BOTH PMN AND MONONUCLEAR NO ORGANISMS SEEN Performed at [facility], [place] [address] [place], [place] [address]    Culture PENDING  Incomplete   Report Status PENDING  Incomplete    RADIOLOGY STUDIES/RESULTS: DG Knee 1-2 Views Right  Result Date: 08/05/2021 CLINICAL DATA:  Right knee pain. EXAM: RIGHT KNEE - 1-2 VIEW COMPARISON:  None. FINDINGS: No evidence of fracture, dislocation, or joint effusion. Moderate narrowing of patellofemoral space is noted. Chondrocalcinosis is noted in the medial and lateral joint spaces. Soft tissues are unremarkable. IMPRESSION: Chondrocalcinosis is noted in the medial and lateral joint spaces suggesting calcium pyrophosphate deposition disease or early degenerative joint disease. Moderate degenerative changes seen involving patellofemoral space. No acute abnormality is noted. Electronically Signed   By: Marijo Conception M.D.   On: 08/05/2021 15:41     LOS: 2 days   Oren Binet, MD  [facility]    To contact the attending provider between 7A-7P or the covering provider  during after hours 7P-7A, please log into the web site [email or web address] and access using universal [place] password for that web site. If you do not have the password, please call  the hospital operator.  08/07/2021, 10:38 AM

## 2021-08-07 NOTE — TOC Progression Note (Addendum)
Transition of Care [facility]) - Progression Note    Patient Details  Name: Cheryl Blankenship MRN: [id number] Date of Birth: [birth year]  Transition of Care [facility]) CM/SW [place], [place] Phone Number: 08/07/2021, 11:37 AM  Clinical Narrative:    CSW received a call back from [place] who noted he needed to talk with his brother, sister, and pt's husband to see if they were able to take her home with [facility] vs a SNF. He stated he would get the SS# and get back w/ CSW when he has spoken to family. TOC will continue to follow for DC needs.  CSW followed up with pt about her SS#, she stated that [place], her son, was supposed to call with the information. Per previous CSW Pt's son# is not right in the chart. CSW met with pt at bedside. She provided a number she said was Shawn's. CSW called this # and it was the pt's. CSW attempted to call pt's daughter, VM was left. CSW met with pt again, another son was in the room, who provided Shawn's #. CSW was unable to leave VM so left secure text message. TOC will continue to follow for DC needs.  Expected Discharge Plan: Skilled Nursing Facility Barriers to Discharge: SNF Pending bed offer  Expected Discharge Plan and Services Expected Discharge Plan: [place]   Discharge Planning Services: CM Consult   Living arrangements for the past 2 months: Apartment                                       Social Determinants of Health (SDOH) Interventions    Readmission Risk Interventions No flowsheet data found.

## 2021-08-07 NOTE — Plan of Care (Signed)
  Problem: Health Behavior/Discharge Planning: Goal: Ability to manage health-related needs will improve Outcome: Progressing   Problem: Clinical Measurements: Goal: Diagnostic test results will improve Outcome: Progressing   Problem: Activity: Goal: Risk for activity intolerance will decrease Outcome: Progressing   Problem: Elimination: Goal: Will not experience complications related to bowel motility Outcome: Progressing

## 2021-08-07 NOTE — Progress Notes (Addendum)
Occupational Therapy Treatment Patient Details Name: Cheryl Blankenship MRN: [id number] DOB: 1950-11-25 Today's Date: 08/07/2021   History of present illness Pt is a 70 y/o female admitted due to hypoglycemia and RLL PNA. Pt with Rt knee pain (s/p right knee aspiration and injection 11/18). PMH includes CHF, DM, HTN, breast cancer, and parkinson's disease.   OT comments  Patient with good progress toward patient focused goals.  She was able to sit edge of bed with supervision, stood with Min a and cues not to pull from the walker, walked around the foot of the bed with RW, Min A and increased time.  Deficits surrounding R leg pain, decreased balance, and decreased cognition remain, but she is moving better.  OT will continue to follow in the acute setting, MD notified that the patient is not quite ready for discharge home.  OT left SNF as the recommendation post acute, however, per the son, if she can stand on her own and walk household distances, home [facility] is a possibility.     Recommendations for follow up therapy are one component of a multi-disciplinary discharge planning process, led by the attending physician.  Recommendations may be updated based on patient status, additional functional criteria and insurance authorization.    Follow Up Recommendations  Home health OT    Assistance Recommended at Discharge Frequent or constant Supervision/Assistance  Equipment Recommendations  Wheelchair cushion (measurements OT);Wheelchair (measurements OT);BSC/3in1    Recommendations for Other Services      Precautions / Restrictions Precautions Precautions: Fall Precaution Comments: R knee pain Restrictions Weight Bearing Restrictions: No       Mobility Bed Mobility Overal bed mobility: Needs Assistance Bed Mobility: Supine to Sit     Supine to sit: Supervision;HOB elevated     General bed mobility comments: pt in chair on arrival and end of session    Transfers Overall  transfer level: Needs assistance Equipment used: Rolling walker (2 wheels) Transfers: Sit to/from Stand Sit to Stand: Min assist           General transfer comment: cues for hand placement with assist to rise from surface and lack of command following to reach for surface to sit     Balance Overall balance assessment: Needs assistance Sitting-balance support: No upper extremity supported;Feet supported Sitting balance-Leahy Scale: Fair     Standing balance support: Reliant on assistive device for balance;Bilateral upper extremity supported Standing balance-Leahy Scale: Poor Standing balance comment: Reliant on BUE support on RW                           ADL either performed or assessed with clinical judgement   ADL   Eating/Feeding: Independent;Sitting   Grooming: Set up;Sitting           Upper Body Dressing : Minimal assistance;Sitting   Lower Body Dressing: Maximal assistance;Sit to/from stand                      Extremity/Trunk Assessment Upper Extremity Assessment Upper Extremity Assessment: Overall WFL for tasks assessed   Lower Extremity Assessment RLE Deficits / Details: R knee swelling. Knee flexion very limited secondary to pain.                          Cognition Arousal/Alertness: Awake/alert Behavior During Therapy: WFL for tasks assessed/performed Overall Cognitive Status: Impaired/Different from baseline Area of Impairment: Orientation;Memory;Attention;Following commands  Orientation Level: Time Current Attention Level: Sustained Memory: Decreased short-term memory Following Commands: Follows one step commands consistently;Follows one step commands with increased time Safety/Judgement: Decreased awareness of deficits;Decreased awareness of safety   Problem Solving: Slow processing;Requires verbal cues;Requires tactile cues General Comments: pt with decreased attention, impaired memory,  perseverating on wanting WC which she does not currently require, pt unable to spell world backward           General Exercises - Lower Extremity Long Arc Quad: AROM;Both;Seated;15 reps Hip Flexion/Marching: AROM;Both;Seated;15 reps                Pertinent Vitals/ Pain       Pain Assessment: No/denies pain Faces Pain Scale: Hurts even more Pain Location: R knee Pain Descriptors / Indicators: Grimacing;Guarding;Moaning Pain Intervention(s): Monitored during session                                                          Frequency  Min 2X/week        Progress Toward Goals  OT Goals(current goals can now be found in the care plan section)  Progress towards OT goals: Progressing toward goals  Acute Rehab OT Goals Patient Stated Goal: Hopin to go home soon OT Goal Formulation: With patient Time For Goal Achievement: 08/20/21 Potential to Achieve Goals: Good  Plan      Co-evaluation                 AM-PAC OT "6 Clicks" Daily Activity     Outcome Measure   Help from another person eating meals?: None Help from another person taking care of personal grooming?: None Help from another person toileting, which includes using toliet, bedpan, or urinal?: A Lot Help from another person bathing (including washing, rinsing, drying)?: A Lot Help from another person to put on and taking off regular upper body clothing?: A Little Help from another person to put on and taking off regular lower body clothing?: A Lot 6 Click Score: 17    End of Session Equipment Utilized During Treatment: Gait belt;Rolling walker (2 wheels)  OT Visit Diagnosis: Unsteadiness on feet (R26.81);Other abnormalities of gait and mobility (R26.89);Muscle weakness (generalized) (M62.81);Pain;Other symptoms and signs involving cognitive function Pain - Right/Left: Right Pain - part of body: Knee   Activity Tolerance Patient tolerated treatment well   Patient Left in  chair;with call bell/phone within reach;with chair alarm set   Nurse Communication          Time: 3154-0086 OT Time Calculation (min): 20 min  Charges: OT General Charges $OT Visit: 1 Visit OT Treatments $Self Care/Home Management : 8-22 mins  08/07/2021  RP, OTR/L  Acute Rehabilitation Services  Office:  [phone]   Cheryl Blankenship 08/07/2021, 1:32 PM

## 2021-08-08 DIAGNOSIS — E162 Hypoglycemia, unspecified: Secondary | ICD-10-CM | POA: Diagnosis not present

## 2021-08-08 LAB — GLUCOSE, CAPILLARY
Glucose-Capillary: 210 mg/dL — ABNORMAL HIGH (ref 70–99)
Glucose-Capillary: 185 mg/dL — ABNORMAL HIGH (ref 70–99)
Glucose-Capillary: 233 mg/dL — ABNORMAL HIGH (ref 70–99)
Glucose-Capillary: 217 mg/dL — ABNORMAL HIGH (ref 70–99)

## 2021-08-08 LAB — BASIC METABOLIC PANEL
Anion gap: 7 (ref 5–15)
BUN: 25 mg/dL — ABNORMAL HIGH (ref 8–23)
CO2: 26 mmol/L (ref 22–32)
Calcium: 10.1 mg/dL (ref 8.9–10.3)
Chloride: 104 mmol/L (ref 98–111)
Creatinine, Ser: 0.94 mg/dL (ref 0.44–1.00)
GFR, Estimated: >60 mL/min (ref >60)
Glucose, Bld: 224 mg/dL — ABNORMAL HIGH (ref 70–99)
Potassium: 4.7 mmol/L (ref 3.5–5.1)
Sodium: 137 mmol/L (ref 135–145)

## 2021-08-08 NOTE — Plan of Care (Signed)
  Problem: Clinical Measurements: Goal: Cardiovascular complication will be avoided Outcome: Progressing   Problem: Activity: Goal: Risk for activity intolerance will decrease Outcome: Progressing   Problem: Elimination: Goal: Will not experience complications related to bowel motility Outcome: Progressing   

## 2021-08-08 NOTE — Progress Notes (Signed)
PROGRESS NOTE        PATIENT DETAILS Name: Cheryl Blankenship Age: 70 y.o. Sex: female Date of Birth: Jul 23, 1951 Admit Date: 08/03/2021 Admitting Physician Evalee Mutton Kristeen Mans, MD SKA:JGOTL, Rachel Moulds, NP  Brief Narrative: Patient is a 70 y.o. female with history of DM-2, HFpEF, HTN, breast cancer, Parkinson's disease-admitted for altered mental status due to hypoglycemia.  She was managed with supportive care-subsequently hypoglycemia resolved-however she developed acute right knee pain.  See below for further details.   Pertinent Labs/Radiology:  11/16>>Blood culture: No growth 11/18>> right knee synovial fluid: No growth.  No crystals seen.  WBC 5400.  11/15>>CXR: Patchy airspace disease in the right lower lung area. 11/17>> bilateral lower extremity Doppler: Negative for DVT.  Subjective: Patient in bed, appears comfortable, denies any headache, no fever, no chest pain or pressure, no shortness of breath , no abdominal pain. No new focal weakness.  Right knee pain improving gradually.   Objective: Vitals: Blood pressure (!) 152/77, pulse 68, temperature 98.2 F (36.8 C), temperature source Oral, resp. rate 18, height 5\' 3"  (1.6 m), weight 105.6 kg, SpO2 97 %.   Exam:  Awake Alert, No new F.N deficits, Normal affect [place].AT,PERRAL Supple Neck, No JVD,   Symmetrical Chest wall movement, Good air movement bilaterally, CTAB RRR,No Gallops, Rubs or new Murmurs,  +ve B.Sounds, Abd Soft, No tenderness,   No Cyanosis, right knee tender to palpate as compared to left, minimal to no effusion   Assessment/Plan:  Hypoglycemia: Due to patient starting intermittent fasting/missing meals while still taking Lantus/NovoLog and Glucotrol XL.  Extensive counseling regarding importance of not missing meals-and letting primary care practitioner know beforehand if she was to begin fasting has been completed.  She has been started on Lantus insulin-see below.    Recent  Labs    08/07/21 1632 08/07/21 2105 08/08/21 0635  GLUCAP 375* 357* 210*    DM-2 (A1c 8.4): CBGs now on the higher side (had hypoglycemia yesterda am)-have increased Semglee to 32 units in a.m. and 26 units in p.m.  Added 4 units of NovoLog with meals.  Continue SSI.  Follow and adjust.  Right lower lobe PNA: Suspect aspiration-continue Rocephin x5 days-okay to stop Zithromax x3 days.    Right knee arthralgia: Likely due to pseudogout-however no crystals seen-intra-articular steroids injected on 11/18 with significant improvement in pain.  Synovial fluid cultures negative so far.  Is able to bear some weight on the right leg but still not comfortable walking by herself, monitor with continued PT encouraged to advance activity.  AKI: Continue to hold ARB improved after IV fluids.  Monitor.  HFpEF: Compensated-resume diuretics once blood pressure improves a bit more.  HTN: BP stable-continue Coreg/amlodipine -held losartan today-should be okay to resume tomorrow.    HLD: Continue statin  Parkinson's disease: Stable-continue Requip  History of breast cancer-s/p left mastectomy: Appreciate oncology follow-up-Aromasin held due to right knee arthritis/arthralgia.   Peripheral neuropathy: Continue Neurontin/Lyrica/Cymbalta  Obesity Estimated body mass index is 41.24 kg/m as calculated from the following:   Height as of this encounter: 5\' 3"  (1.6 m).   Weight as of this encounter: 105.6 kg.     Procedures: None Consults: None DVT Prophylaxis: Lovenox Code Status:Full code  Family Communication: Son at bedside currently sleeping on 08/08/2021.  Time spent: 25 minutes-Greater than 50% of this time was spent in counseling, explanation  of diagnosis, planning of further management, and coordination of care.  Disposition Plan: Status is: Inpatient  Remain inpatient-not yet stable for discharge given severity of right knee pain.  Unable to bear weight.  Evaluated by PT with  recommendations for SNF/HHPT.  Diet: Diet Order             Diet heart healthy/carb modified Room service appropriate? Yes; Fluid consistency: Thin; Fluid restriction: 1200 mL Fluid  Diet effective now                     Antimicrobial agents: Anti-infectives (From admission, onward)    Start     Dose/Rate Route Frequency Ordered Stop   08/04/21 2300  azithromycin (ZITHROMAX) 500 mg in sodium chloride 0.9 % 250 mL IVPB  Status:  Discontinued        500 mg 250 mL/hr over 60 Minutes Intravenous Every 24 hours 08/04/21 0156 08/06/21 1006   08/04/21 2200  cefTRIAXone (ROCEPHIN) 2 g in sodium chloride 0.9 % 100 mL IVPB        2 g 200 mL/hr over 30 Minutes Intravenous Every 24 hours 08/04/21 0156 08/09/21 2159   08/03/21 2315  cefTRIAXone (ROCEPHIN) 1 g in sodium chloride 0.9 % 100 mL IVPB        1 g 200 mL/hr over 30 Minutes Intravenous  Once 08/03/21 2310 08/04/21 0125   08/03/21 2315  azithromycin (ZITHROMAX) 500 mg in sodium chloride 0.9 % 250 mL IVPB        500 mg 250 mL/hr over 60 Minutes Intravenous  Once 08/03/21 2310 08/04/21 0222        MEDICATIONS: Scheduled Meds:  amLODipine  10 mg Oral Daily   aspirin  81 mg Oral Daily   carvedilol  25 mg Oral BID WC   cycloSPORINE  1 drop Both Eyes BID   DULoxetine  30 mg Oral Daily   enoxaparin (LOVENOX) injection  40 mg Subcutaneous T01S   folic acid  1 mg Oral QPM   gabapentin  400 mg Oral q morning   insulin aspart  0-9 Units Subcutaneous TID AC & HS   insulin aspart  4 Units Subcutaneous TID WC   insulin glargine-yfgn  26 Units Subcutaneous QHS   insulin glargine-yfgn  32 Units Subcutaneous Daily   living well with diabetes book   Does not apply Once   losartan  100 mg Oral Daily   pregabalin  75 mg Oral QHS   rOPINIRole  0.25 mg Oral TID   rosuvastatin  5 mg Oral QHS   sodium chloride flush  3 mL Intravenous Q12H   vitamin B-12  1,000 mcg Oral QPM   Continuous Infusions:  sodium chloride     cefTRIAXone  (ROCEPHIN)  IV 2 g (08/07/21 2149)   PRN Meds:.sodium chloride, acetaminophen **OR** acetaminophen, albuterol, ALPRAZolam, hydrALAZINE, methocarbamol, oxyCODONE-acetaminophen   I have personally reviewed following labs and imaging studies  LABORATORY DATA:  Recent Labs  Lab 08/03/21 2124 08/04/21 0256 08/06/21 0402  WBC 6.2 7.0 5.0  HGB 12.1 11.7* 10.0*  HCT 38.5 37.9 31.7*  PLT 240 250 230  MCV 90.0 89.4 88.8  MCH 28.3 27.6 28.0  MCHC 31.4 30.9 31.5  RDW 13.3 13.3 13.2  LYMPHSABS 1.1  --   --   MONOABS 0.4  --   --   EOSABS 0.2  --   --   BASOSABS 0.0  --   --     Recent Labs  Lab  08/03/21 2124 08/04/21 0256 08/06/21 0402 08/07/21 0323 08/08/21 0357  NA 142 136 138 135 137  K 4.6 4.2 4.1 5.0 4.7  CL 108 106 105 102 104  CO2 26 22 28 27 26   GLUCOSE 71 146* 79 315* 224*  BUN 17 17 20  26* 25*  CREATININE 0.92 0.82 1.12* 1.20* 0.94  CALCIUM 10.7* 10.0 10.3 10.2 10.1  AST 18 23 10*  --   --   ALT 14 16 13   --   --   ALKPHOS 62 61 49  --   --   BILITOT 0.6 0.7 0.5  --   --   ALBUMIN 4.0 3.5 3.1*  --   --   INR  --   --  1.1  --   --   HGBA1C  --  8.4*  --   --   --           RADIOLOGY STUDIES/RESULTS: No results found.   LOS: 3 days   Signature  Lala Lund M.D on 08/08/2021 at 9:45 AM   -  To page go to [email or web address]

## 2021-08-09 ENCOUNTER — Other Ambulatory Visit (HOSPITAL_COMMUNITY): Payer: Self-pay

## 2021-08-09 ENCOUNTER — Other Ambulatory Visit: Payer: Medicare HMO

## 2021-08-09 ENCOUNTER — Ambulatory Visit: Payer: Medicare HMO | Admitting: Hematology

## 2021-08-09 ENCOUNTER — Ambulatory Visit: Payer: Medicare HMO

## 2021-08-09 DIAGNOSIS — E162 Hypoglycemia, unspecified: Secondary | ICD-10-CM | POA: Diagnosis not present

## 2021-08-09 DIAGNOSIS — C50412 Malignant neoplasm of upper-outer quadrant of left female breast: Secondary | ICD-10-CM

## 2021-08-09 LAB — BODY FLUID CULTURE W GRAM STAIN: Culture: NO GROWTH

## 2021-08-09 LAB — CULTURE, BLOOD (ROUTINE X 2)
Culture: NO GROWTH
Culture: NO GROWTH
Special Requests: ADEQUATE
Special Requests: ADEQUATE

## 2021-08-09 LAB — GLUCOSE, CAPILLARY
Glucose-Capillary: 154 mg/dL — ABNORMAL HIGH (ref 70–99)
Glucose-Capillary: 160 mg/dL — ABNORMAL HIGH (ref 70–99)

## 2021-08-09 MED ORDER — INSULIN GLARGINE 100 UNIT/ML ~~LOC~~ SOLN: 30.0000 [IU] | Freq: Two times a day (BID) | SUBCUTANEOUS | 0 refills | Status: AC

## 2021-08-09 MED ORDER — ACETAMINOPHEN 500 MG PO TABS
500.0000 mg | ORAL_TABLET | Freq: Four times a day (QID) | ORAL | 0 refills | Status: AC | PRN
  Filled 2021-08-09: qty 20, 5d supply, fill #0

## 2021-08-09 MED ORDER — TRAMADOL HCL 50 MG PO TABS
50.0000 mg | ORAL_TABLET | Freq: Two times a day (BID) | ORAL | 0 refills | Status: DC | PRN
  Filled 2021-08-09: qty 10, 5d supply, fill #0

## 2021-08-09 NOTE — Plan of Care (Signed)
  Problem: Health Behavior/Discharge Planning: Goal: Ability to manage health-related needs will improve Outcome: Progressing   Problem: Clinical Measurements: Goal: Respiratory complications will improve Outcome: Progressing   Problem: Activity: Goal: Risk for activity intolerance will decrease Outcome: Progressing   Problem: Nutrition: Goal: Adequate nutrition will be maintained Outcome: Progressing

## 2021-08-09 NOTE — Progress Notes (Signed)
Occupational Therapy Treatment and Discharge Patient Details Name: Cheryl Blankenship MRN: [id number] DOB: Jun 07, 1951 Today's Date: 08/09/2021   History of present illness Pt is a 70 y/o female admitted due to hypoglycemia and RLL PNA. Pt with Rt knee pain (s/p right knee aspiration and injection 11/18). PMH includes CHF, DM, HTN, breast cancer, and parkinson's disease.   OT comments  [name] admitted with above seen today and moving much better than on eval due to knee pain is better but not gone. Pt is overall at a S-min A level for mobility and setup/S-Mod A for basic ADLs. Provided her son in room with a gait belt to use with his mom. Pt to D/C today so we will D/C from acute OT.   Recommendations for follow up therapy are one component of a multi-disciplinary discharge planning process, led by the attending physician.  Recommendations may be updated based on patient status, additional functional criteria and insurance authorization.    Follow Up Recommendations  Home health OT    Assistance Recommended at Discharge Frequent or constant Supervision/Assistance  Equipment Recommendations  Wheelchair cushion (measurements OT);Wheelchair (measurements OT);BSC/3in1       Precautions / Restrictions Precautions Precautions: Fall Precaution Comments: R knee pain--getting better Restrictions Weight Bearing Restrictions: No       Mobility Bed Mobility Overal bed mobility: Needs Assistance Bed Mobility: Supine to Sit     Supine to sit: Supervision;HOB elevated          Transfers Overall transfer level: Needs assistance Equipment used: Rolling walker (2 wheels) Transfers: Sit to/from Stand Sit to Stand: Min A           General transfer comment: VCs for safe hand placement     Balance Overall balance assessment: Needs assistance Sitting-balance support: No upper extremity supported;Feet supported Sitting balance-Leahy Scale: Good     Standing balance  support: Bilateral upper extremity supported;Reliant on assistive device for balance Standing balance-Leahy Scale: Poor                             ADL either performed or assessed with clinical judgement   ADL Overall ADL's : Needs assistance/impaired                     Lower Body Dressing: Moderate assistance Lower Body Dressing Details (indicate cue type and reason): min A sit<>stand, can do all LBD for LLE, but not for RLE (is moving it better than when I last saw her) Toilet Transfer: Minimal assistance;Ambulation;Rolling walker (2 wheels) Toilet Transfer Details (indicate cue type and reason): simulated bed>recliner 5 feet away                Extremity/Trunk Assessment Upper Extremity Assessment Upper Extremity Assessment: Overall WFL for tasks assessed            Vision Patient Visual Report: No change from baseline            Cognition Arousal/Alertness: Awake/alert Behavior During Therapy: WFL for tasks assessed/performed Overall Cognitive Status: Impaired/Different from baseline                                 General Comments: talking in circles, says okay and yes alot but then does not alwasy follow through with [facility]  Pertinent Vitals/ Pain       Pain Assessment: Faces Faces Pain Scale: Hurts little more Pain Location: R knee with weight bearing Pain Descriptors / Indicators: Guarding;Sore Pain Intervention(s): Limited activity within patient's tolerance;Monitored during session;Repositioned         Frequency  Min 2X/week        Progress Toward Goals  OT Goals(current goals can now be found in the care plan section)  Progress towards OT goals: Progressing toward goals  Acute Rehab OT Goals Patient Stated Goal: to go home today OT Goal Formulation: With patient Time For Goal Achievement: 08/20/21 Potential to Achieve Goals: Good  Plan Discharge plan remains appropriate        AM-PAC OT "6 Clicks" Daily Activity     Outcome Measure   Help from another person eating meals?: None Help from another person taking care of personal grooming?: A Little Help from another person toileting, which includes using toliet, bedpan, or urinal?: A Little Help from another person bathing (including washing, rinsing, drying)?: A Lot Help from another person to put on and taking off regular upper body clothing?: A Little Help from another person to put on and taking off regular lower body clothing?: A Lot 6 Click Score: 17    End of Session Equipment Utilized During Treatment: Gait belt;Rolling walker (2 wheels)  OT Visit Diagnosis: Unsteadiness on feet (R26.81);Other abnormalities of gait and mobility (R26.89);Muscle weakness (generalized) (M62.81);Pain;Other symptoms and signs involving cognitive function Pain - Right/Left: Right Pain - part of body: Knee   Activity Tolerance Patient tolerated treatment well   Patient Left in chair;with call bell/phone within reach;with chair alarm set;with family/visitor present           Time: 0626-9485 OT Time Calculation (min): 20 min  Charges: OT General Charges $OT Visit: 1 Visit OT Treatments $Self Care/Home Management : 8-22 mins  [address] [address], OTR/L Acute Rehab Services Pager [phone] Office [phone]    Almon Register 08/09/2021, 3:28 PM

## 2021-08-09 NOTE — Progress Notes (Addendum)
Inpatient Diabetes Program Recommendations  AACE/ADA: New Consensus Statement on Inpatient Glycemic Control (2015)  Target Ranges:  Prepandial:   less than 140 mg/dL      Peak postprandial:   less than 180 mg/dL (1-2 hours)      Critically ill patients:  140 - 180 mg/dL   Lab Results  Component Value Date   GLUCAP 154 (H) 08/09/2021   HGBA1C 8.4 (H) 08/04/2021   Note: Spoke with daughter Jessica Priest to check with patient on discharge to assure she does not take Novolog meal coverage if she does not eat and also to alert her PCP if she is fasting along with checking CBGs. Daughter now aware that patient was fasting prior to admission without telling anyone and plans to discuss with patient  and support with readings.  Met with patient and instructed on how to place a Libre continuous glucose sensor and also applied a new sensor to back of right upper arm and can be scanned after 60 minutes from application. Reviewed with patient to scan @ least q 8 hrs. But recommend @ least qid and scanning after meals and snacks to review how foods elevated her CBGs. Patient has folder of information to take with her and has reviewed with pt.  Thank you, Nani Gasser. Shannelle Alguire, RN, MSN, CDE  Diabetes Coordinator Inpatient Glycemic Control Team Team Pager [phone] (8am-5pm) 08/09/2021 10:28 AM

## 2021-08-09 NOTE — Discharge Instructions (Signed)
Follow with Primary MD Cipriano Mile, NP in 7 days   Get CBC, CMP, 2 view Chest X ray -  checked next visit within 1 week by Primary MD    Activity: As tolerated with Full fall precautions use walker/cane & assistance as needed  Disposition Home    Diet: Heart Healthy Low Carb  Accuchecks 4 times/day, Once in AM empty stomach and then before each meal. Log in all results and show them to your Prim.MD in 3 days. If any glucose reading is under 80 or above 300 call your Prim MD immidiately. Follow Low glucose instructions for glucose under 80 as instructed.   Special Instructions: If you have smoked or chewed Tobacco  in the last 2 yrs please stop smoking, stop any regular Alcohol  and or any Recreational drug use.  On your next visit with your primary care physician please Get Medicines reviewed and adjusted.  Please request your Prim.MD to go over all Hospital Tests and Procedure/Radiological results at the follow up, please get all Hospital records sent to your Prim MD by signing hospital release before you go home.  If you experience worsening of your admission symptoms, develop shortness of breath, life threatening emergency, suicidal or homicidal thoughts you must seek medical attention immediately by calling 911 or calling your MD immediately  if symptoms less severe.  You Must read complete instructions/literature along with all the possible adverse reactions/side effects for all the Medicines you take and that have been prescribed to you. Take any new Medicines after you have completely understood and accpet all the possible adverse reactions/side effects.

## 2021-08-09 NOTE — TOC Transition Note (Addendum)
Transition of Care [facility]) - CM/SW Discharge Note   Patient Details  Name: Rukiya Hodgkins MRN: [id number] Date of Birth: [birth year]  [facility] of [facility]) CM/SW Contact:  Tom-Johnson, Renea Ee, RN Phone Number: 08/09/2021, 11:27 AM   Clinical Narrative:    Patient is scheduled for discharge today. CM spoke with patient at bedside about home health choices.List given to patient from [email or web address]. Patient chose [place]. Tommi Rumps [phone]) called and referral made with acceptance voiced. Patient requested for a walker and bedside commode. Jermaine with [employer] notified and order placed. Brenton Grills will deliver to patient at bedside. Patient's family to transport at discharge. No further TOC needs noted. 12:10- Spoke with Penni Bombard, Diabetes nurse and told her about patient's concern that her Dexcom is not working. Penni Bombard states she will reach out to [place] to make sure she checks on patient before she discharges. No further TOC needs noted.   Final next level of care: [place] Barriers to Discharge: Barriers Resolved   Patient Goals and CMS Choice Patient states their goals for this hospitalization and ongoing recovery are:: To go home CMS Medicare.gov Compare Post Acute Care list provided to:: Patient Choice offered to / list presented to : Patient  Discharge Placement                       Discharge Plan and Services   Discharge Planning Services: CM Consult            DME Arranged: Gilford Rile rolling, Bedside commode DME Agency: [employer] Date DME Agency Contacted: 08/09/21 Time DME Agency Contacted: 2355 Representative spoke with at DME Agency: Brenton Grills HH Arranged: PT, OT [place] Agency: [place] Date [place]: 08/09/21 Time [place]: 1120 Representative spoke with at [place]: [place] ([place]) Interventions     Readmission Risk Interventions No flowsheet data  found.

## 2021-08-09 NOTE — Discharge Summary (Addendum)
Cheryl Blankenship:[id number] DOB: [birth year] DOA: 08/03/2021  PCP: Cheryl Mile, NP  Admit date: 08/03/2021  Discharge date: 08/09/2021  Admitted From: Home   Disposition:  Home   Recommendations for Outpatient Follow-up:   Follow up with PCP in 1-2 weeks  PCP Please obtain BMP/CBC, 2 view CXR in 1week,  (see Discharge instructions)   PCP Please follow up on the following pending results:    Home Health: PT, OT   Equipment/Devices: as below  Consultations: Ortho Discharge Condition: Stable   CODE STATUS: Full   Diet Recommendation:   Diet Order             Diet heart healthy/carb modified Room service appropriate? Yes; Fluid consistency: Thin; Fluid restriction: 1200 mL Fluid  Diet effective now                    Chief Complaint  Patient presents with   Hypoglycemia     Brief history of present illness from the day of admission and additional interim summary    Patient is a 70 y.o. female with history of DM-2, HFpEF, HTN, breast cancer, Parkinson's disease-admitted for altered mental status due to hypoglycemia.  She was managed with supportive care-subsequently hypoglycemia resolved-however she developed acute right knee pain.  See below for further details.     Pertinent Labs/Radiology:   11/16>>Blood culture: No growth 11/18>> right knee synovial fluid: No growth.  No crystals seen.  WBC 5400.   11/15>>CXR: Patchy airspace disease in the right lower lung area. 11/17>> bilateral lower extremity Doppler: Negative for DVT                                                                 Hospital Course    DM-2 (A1c 8.4) presented with hypoglycemic events to the hospital: Also her meals were not scheduled appropriately at home and she was eating quite erratically at different times, she  was on combination of 70 of Lantus twice daily sliding scale and Glucotrol XL, she received diabetic and insulin education prior to discharge, she has confirmed she has testing supplies.  Her diabetic regimen has been changed to Lantus 35 twice daily along with sliding scale.  Glucotrol XL has been held.  PCP to monitor her glycemic control closely.  She has been requested to check CBGs q. [facility] maintain a logbook and show it to PCP in a week.   Right lower lobe PNA: Suspect aspiration she has finished her antibiotic regimen.   Right knee arthralgia: Likely due to pseudogout-however no crystals seen-intra-articular steroids injected on 11/18 with significant improvement in pain.  Synovial fluid cultures negative so far.  Is able to bear some weight on the right leg and today eager to go home with home PT which will be done,  appropriate DME including rolling walker and bedside commode have been ordered.   AKI: Resolved, received IV fluids and ARB was held.  Home medications resumed PCP to monitor.   HFpEF: Compensated-resume diuretics once blood pressure improves a bit more.   HTN: BP stable-continue home regimen upon discharge PCP to monitor.   HLD: Continue statin   Parkinson's disease: Stable-continue Requip   History of breast cancer-s/p left mastectomy: Appreciate oncology follow-up-Aromasin held due to right knee arthritis/arthralgia.    Peripheral neuropathy: Continue Neurontin/Lyrica/Cymbalta   Obesity Estimated body mass index is 41.24 kg/m, follow with PCP for weight loss.   Discharge diagnosis     Principal Problem:   Hypoglycemia Active Problems:   Hypothermia   CAP (community acquired pneumonia)   Chronic diastolic CHF (congestive heart failure) (HCC)   Parkinson disease (HCC)   Breast cancer (HCC)   Pseudogout of right knee    Discharge instructions    Discharge Instructions     Discharge instructions   Complete by: As directed    Follow with Primary MD  Cheryl Mile, NP in 7 days   Get CBC, CMP, 2 view Chest X ray -  checked next visit within 1 week by Primary MD    Activity: As tolerated with Full fall precautions use walker/cane & assistance as needed  Disposition Home    Diet: Heart Healthy Low Carb  Accuchecks 4 times/day, Once in AM empty stomach and then before each meal. Log in all results and show them to your Prim.MD in 3 days. If any glucose reading is under 80 or above 300 call your Prim MD immidiately. Follow Low glucose instructions for glucose under 80 as instructed.   Special Instructions: If you have smoked or chewed Tobacco  in the last 2 yrs please stop smoking, stop any regular Alcohol  and or any Recreational drug use.  On your next visit with your primary care physician please Get Medicines reviewed and adjusted.  Please request your Prim.MD to go over all Hospital Tests and Procedure/Radiological results at the follow up, please get all Hospital records sent to your Prim MD by signing hospital release before you go home.  If you experience worsening of your admission symptoms, develop shortness of breath, life threatening emergency, suicidal or homicidal thoughts you must seek medical attention immediately by calling 911 or calling your MD immediately  if symptoms less severe.  You Must read complete instructions/literature along with all the possible adverse reactions/side effects for all the Medicines you take and that have been prescribed to you. Take any new Medicines after you have completely understood and accpet all the possible adverse reactions/side effects.   Increase activity slowly   Complete by: As directed        Discharge Medications   Allergies as of 08/09/2021       Reactions   Coconut (cocos Nucifera) Allergy Skin Test    Hydrochlorothiazide Swelling   Other    Pt is allergic to a pain medication. Unsure of the name   Lisinopril Rash        Medication List     STOP taking these  medications    glipiZIDE 10 MG 24 hr tablet Commonly known as: GLUCOTROL XL   metoprolol tartrate 50 MG tablet Commonly known as: LOPRESSOR       TAKE these medications    Acetaminophen Extra Strength 500 MG tablet Generic drug: acetaminophen Take 1 tablet (500 mg total) by mouth every 6 (six) hours as needed  for moderate pain, headache or mild pain. What changed: reasons to take this   albuterol 108 (90 Base) MCG/ACT inhaler Commonly known as: VENTOLIN HFA Inhale 1 puff into the lungs every 4 (four) hours as needed for wheezing or shortness of breath.   ALPRAZolam 0.25 MG tablet Commonly known as: XANAX Take 0.25-0.5 mg by mouth 3 (three) times daily as needed for anxiety.   amLODipine 10 MG tablet Commonly known as: NORVASC Take 10 mg by mouth daily.   aspirin 81 MG chewable tablet Chew 81 mg by mouth daily.   carvedilol 25 MG tablet Commonly known as: COREG Take 25 mg by mouth 2 (two) times daily with a meal.   cholecalciferol 25 MCG (1000 UNIT) tablet Commonly known as: VITAMIN D Take 1,000 Units by mouth at bedtime.   cycloSPORINE 0.05 % ophthalmic emulsion Commonly known as: RESTASIS Place 1 drop into both eyes 2 (two) times daily.   diphenoxylate-atropine 2.5-0.025 MG tablet Commonly known as: LOMOTIL Take 1-2 tablets by mouth 4 (four) times daily as needed for diarrhea or loose stools.   DULoxetine 30 MG capsule Commonly known as: CYMBALTA Take 30 mg by mouth daily.   exemestane 25 MG tablet Commonly known as: AROMASIN Take 25 mg by mouth at bedtime.   folic acid 1 MG tablet Commonly known as: FOLVITE Take 1 mg by mouth every evening.   furosemide 20 MG tablet Commonly known as: LASIX Take 20 mg by mouth daily as needed (swelling/weight gain).   gabapentin 400 MG capsule Commonly known as: NEURONTIN Take 400 mg by mouth daily.   insulin glargine 100 UNIT/ML injection Commonly known as: LANTUS Inject 0.3 mLs (30 Units total) into the skin  2 (two) times daily. What changed: how much to take   lactulose 10 GM/15ML solution Commonly known as: CHRONULAC Take 30 g by mouth daily.   losartan 100 MG tablet Commonly known as: COZAAR Take 100 mg by mouth daily.   methocarbamol 500 MG tablet Commonly known as: ROBAXIN Take 500 mg by mouth every 8 (eight) hours as needed for muscle spasms.   NOVOLOG FLEXPEN [place] Inject 0-10 Units into the skin with breakfast, with lunch, and with evening meal. 0-150 = 0 units 150-199 = 2 units 200-249 = 4 units 250-299 = 6 units 300-349 = 8 units Greater than 350 = 10 units   omeprazole 20 MG capsule Commonly known as: PRILOSEC Take 20 mg by mouth daily.   ondansetron 4 MG disintegrating tablet Commonly known as: ZOFRAN-ODT Take 4 mg by mouth 2 (two) times daily as needed for nausea or vomiting.   potassium chloride 10 MEQ CR capsule Commonly known as: MICRO-K Take 10 mEq by mouth daily.   pregabalin 75 MG capsule Commonly known as: LYRICA Take 75 mg by mouth at bedtime.   promethazine 25 MG suppository Commonly known as: PHENERGAN Place 25 mg rectally every 8 (eight) hours as needed for nausea or vomiting.   rOPINIRole 0.25 MG tablet Commonly known as: REQUIP Take 0.25 mg by mouth 3 (three) times daily.   rosuvastatin 5 MG tablet Commonly known as: CRESTOR Take 5 mg by mouth at bedtime.   spironolactone 25 MG tablet Commonly known as: ALDACTONE Take 25 mg by mouth daily.   torsemide 20 MG tablet Commonly known as: DEMADEX Take 40 mg by mouth 2 (two) times daily.   traMADol 50 MG tablet Commonly known as: Ultram Take 1 tablet (50 mg total) by mouth every 12 (twelve) hours as needed  for severe pain.   vitamin B-12 1000 MCG tablet Commonly known as: CYANOCOBALAMIN Take 1,000 mcg by mouth every evening.   zolpidem 5 MG tablet Commonly known as: AMBIEN Take 5 mg by mouth at bedtime.               Durable Medical Equipment  (From admission, onward)            Start     Ordered   08/09/21 0750  For home use only DME Bedside commode  Once       Question:  Patient needs a bedside commode to treat with the following condition  Answer:  Weakness   08/09/21 0749   08/09/21 0750  For home use only DME Walker rolling  Once       Comments: 5 wheel  Question Answer Comment  Walker: With [place] Wheels   Patient needs a walker to treat with the following condition Weakness      08/09/21 0749   08/09/21 0749  For home use only DME 3 n 1  Once        08/09/21 1761             Follow-up Information     Cheryl Mile, NP. Schedule an appointment as soon as possible for a visit in 1 week(s).   Contact information: [place] [address] [phone]         Vanetta Mulders, MD. Schedule an appointment as soon as possible for a visit in 1 week(s).   Specialty: Orthopedic Surgery Contact information: [place] [address] [phone]         Renato Shin, MD. Schedule an appointment as soon as possible for a visit in 1 week(s).   Specialty: Endocrinology Why: DM2 Contact information: [address] [place] [address] [phone]         [facility] Follow up.   Specialty: Home Health Services Why: Someone wil lcall for first home visit. Contact information: [address] [place] [place] [address] [phone]                 Major procedures and Radiology Reports - PLEASE review detailed and final reports thoroughly  -       DG Knee 1-2 Views Right  Result Date: 08/05/2021 CLINICAL DATA:  Right knee pain. EXAM: RIGHT KNEE - 1-2 VIEW COMPARISON:  None. FINDINGS: No evidence of fracture, dislocation, or joint effusion. Moderate narrowing of patellofemoral space is noted. Chondrocalcinosis is noted in the medial and lateral joint spaces. Soft tissues are unremarkable. IMPRESSION: Chondrocalcinosis is noted in the medial  and lateral joint spaces suggesting calcium pyrophosphate deposition disease or early degenerative joint disease. Moderate degenerative changes seen involving patellofemoral space. No acute abnormality is noted. Electronically Signed   By: Marijo Conception M.D.   On: 08/05/2021 15:41   DG Chest Port 1 View  Result Date: 08/03/2021 CLINICAL DATA:  Back pain. EXAM: PORTABLE CHEST 1 VIEW COMPARISON:  No prior exams available FINDINGS: Right chest port with tip in the lower SVC. Upper normal heart size/mild cardiomegaly. Normal mediastinal contours. Patchy airspace disease in the right mid lower lung zone. Mild vascular congestion without pulmonary edema. There may be a small right pleural effusion. No pneumothorax. On limited evaluation, no acute osseous abnormalities are seen. Endplate spurring is seen in the thoracic spine, though not well assessed on this portable AP view. Surgical clips in  the left axilla. IMPRESSION: 1. Patchy airspace disease in the right mid lower lung zone, suspicious for pneumonia. Possible small right pleural effusion. 2. Upper normal heart size/mild cardiomegaly. Mild vascular congestion without pulmonary edema. Electronically Signed   By: Keith Rake M.D.   On: 08/03/2021 22:55   VAS [place] LOWER EXTREMITY VENOUS (DVT)  Result Date: 08/05/2021  Lower Venous DVT Study Patient Name:  HUXLEY VANWAGONER  Date of Exam:   08/05/2021 Medical Rec #: [id number]          Accession #:    [id number] Date of Birth: 30-Apr-1951          Patient Gender: F Patient Age:   39 years Exam Location:  [facility] Procedure:      VAS [place] LOWER EXTREMITY VENOUS (DVT) Referring Phys: Oren Binet --------------------------------------------------------------------------------  Indications: Swelling, pain in knees RT>LT.  Anticoagulation: Lovenox. Limitations: Body habitus. Comparison Study: No prior studies. Performing Technologist: Darlin Coco RDMS, RVT  Examination Guidelines: A complete  evaluation includes B-mode imaging, spectral Doppler, color Doppler, and power Doppler as needed of all accessible portions of each vessel. Bilateral testing is considered an integral part of a complete examination. Limited examinations for reoccurring indications may be performed as noted. The reflux portion of the exam is performed with the patient in reverse Trendelenburg.  +---------+---------------+---------+-----------+---------------+--------------+ RIGHT    CompressibilityPhasicitySpontaneityProperties     Thrombus Aging +---------+---------------+---------+-----------+---------------+--------------+ CFV      Full           Yes      Yes                                      +---------+---------------+---------+-----------+---------------+--------------+ SFJ      Full                                                             +---------+---------------+---------+-----------+---------------+--------------+ FV Prox  Full                                                             +---------+---------------+---------+-----------+---------------+--------------+ FV Mid   Full                                                             +---------+---------------+---------+-----------+---------------+--------------+ FV DistalFull                               Duplicated -                                                              both patent                   +---------+---------------+---------+-----------+---------------+--------------+  PFV      Full                                                             +---------+---------------+---------+-----------+---------------+--------------+ POP      Full           Yes      Yes                                      +---------+---------------+---------+-----------+---------------+--------------+ PTV      Full                                                              +---------+---------------+---------+-----------+---------------+--------------+ PERO     Full                                                             +---------+---------------+---------+-----------+---------------+--------------+ Gastroc  Full                                                             +---------+---------------+---------+-----------+---------------+--------------+   +---------+---------------+---------+-----------+----------+--------------+ LEFT     CompressibilityPhasicitySpontaneityPropertiesThrombus Aging +---------+---------------+---------+-----------+----------+--------------+ CFV      Full           Yes      Yes                                 +---------+---------------+---------+-----------+----------+--------------+ SFJ      Full                                                        +---------+---------------+---------+-----------+----------+--------------+ FV Prox  Full                                                        +---------+---------------+---------+-----------+----------+--------------+ FV Mid   Full                                                        +---------+---------------+---------+-----------+----------+--------------+ FV DistalFull                                                        +---------+---------------+---------+-----------+----------+--------------+  PFV      Full                                                        +---------+---------------+---------+-----------+----------+--------------+ POP      Full           Yes      Yes                                 +---------+---------------+---------+-----------+----------+--------------+ PTV      Full                                                        +---------+---------------+---------+-----------+----------+--------------+ PERO     Full                                                         +---------+---------------+---------+-----------+----------+--------------+ Gastroc  Full                                                        +---------+---------------+---------+-----------+----------+--------------+     Summary: RIGHT: - There is no evidence of deep vein thrombosis in the lower extremity.  - No cystic structure found in the popliteal fossa.  LEFT: - There is no evidence of deep vein thrombosis in the lower extremity.  - No cystic structure found in the popliteal fossa.  *See table(s) above for measurements and observations. Electronically signed by Orlie Pollen on 08/05/2021 at 7:04:10 PM.    Final      Today   Subjective    Cheryl Blankenship today has no headache,no chest abdominal pain,no new weakness tingling or numbness, feels much better wants to go home today.    Objective   Blood pressure (!) 152/78, pulse 63, temperature 98.6 F (37 C), temperature source Oral, resp. rate 16, height 5\' 3"  (1.6 m), weight 105.6 kg, SpO2 97 %.   Intake/Output Summary (Last 24 hours) at 08/09/2021 1301 Last data filed at 08/09/2021 0900 Gross per 24 hour  Intake 460 ml  Output 1175 ml  Net -715 ml    Exam  Awake Alert, No new F.N deficits, Normal affect [place].AT,PERRAL Supple Neck,No JVD, No cervical lymphadenopathy appriciated.  Symmetrical Chest wall movement, Good air movement bilaterally, CTAB RRR,No Gallops,Rubs or new Murmurs, No Parasternal Heave +ve B.Sounds, Abd Soft, Non tender, No organomegaly appriciated, No rebound -guarding or rigidity. No Cyanosis, minimal R knee tenderness, no warmth - no effusion   Data Review   CBC w Diff:  Lab Results  Component Value Date   WBC 5.0 08/06/2021   HGB 10.0 (L) 08/06/2021   HCT 31.7 (L) 08/06/2021   PLT 230 08/06/2021   LYMPHOPCT 18 08/03/2021   MONOPCT 7 08/03/2021   EOSPCT 2 08/03/2021  BASOPCT 1 08/03/2021    CMP:  Lab Results  Component Value Date   NA 137 08/08/2021   K 4.7 08/08/2021   CL 104  08/08/2021   CO2 26 08/08/2021   BUN 25 (H) 08/08/2021   CREATININE 0.94 08/08/2021   PROT 6.4 (L) 08/06/2021   ALBUMIN 3.1 (L) 08/06/2021   BILITOT 0.5 08/06/2021   ALKPHOS 49 08/06/2021   AST 10 (L) 08/06/2021   ALT 13 08/06/2021  . Lab Results  Component Value Date   HGBA1C 8.4 (H) 08/04/2021    CBG (last 3)  Recent Labs    08/08/21 2107 08/09/21 0632 08/09/21 1146  GLUCAP 217* 154* 160*     Total Time in preparing paper work, data evaluation and todays exam - 12 minutes  Lala Lund M.D on 08/09/2021 at 1:01 PM  [facility]

## 2021-08-09 NOTE — Care Management Important Message (Signed)
Important Message  Patient Details  Name: Cheryl Blankenship MRN: [id number] Date of Birth: [birth year]   Medicare Important Message Given:        Orbie Pyo 08/09/2021, 2:04 PM

## 2021-08-10 ENCOUNTER — Encounter: Payer: Self-pay | Admitting: Hematology

## 2021-08-16 ENCOUNTER — Encounter (INDEPENDENT_AMBULATORY_CARE_PROVIDER_SITE_OTHER): Payer: Medicare HMO | Admitting: Ophthalmology

## 2021-08-19 ENCOUNTER — Encounter: Payer: Self-pay | Admitting: Hematology

## 2021-08-24 ENCOUNTER — Ambulatory Visit: Payer: Medicare HMO

## 2021-08-24 ENCOUNTER — Other Ambulatory Visit: Payer: Medicare HMO

## 2021-08-25 ENCOUNTER — Encounter: Payer: Self-pay | Admitting: Hematology

## 2021-08-25 ENCOUNTER — Other Ambulatory Visit: Payer: Self-pay

## 2021-08-25 ENCOUNTER — Telehealth: Payer: Self-pay | Admitting: Hematology

## 2021-08-25 NOTE — Telephone Encounter (Signed)
Scheduled per sch msg. Called patient, patients daughter, and patients son.. no answer on any of the phone numbers. Not able to leave a message on any of the numbers. Attempted twice on all. Will attempt again later today.

## 2021-08-27 ENCOUNTER — Ambulatory Visit: Payer: 59

## 2021-08-27 ENCOUNTER — Other Ambulatory Visit: Payer: 59

## 2021-08-31 ENCOUNTER — Encounter (HOSPITAL_BASED_OUTPATIENT_CLINIC_OR_DEPARTMENT_OTHER): Payer: Self-pay | Admitting: Radiology

## 2021-08-31 ENCOUNTER — Other Ambulatory Visit: Payer: Self-pay

## 2021-08-31 ENCOUNTER — Ambulatory Visit (HOSPITAL_BASED_OUTPATIENT_CLINIC_OR_DEPARTMENT_OTHER)
Admission: RE | Admit: 2021-08-31 | Discharge: 2021-08-31 | Disposition: A | Discharge status: Home / Self Care | Payer: Medicare Other | Source: Ambulatory Visit | Attending: Hematology | Admitting: Hematology

## 2021-08-31 DIAGNOSIS — Z1231 Encounter for screening mammogram for malignant neoplasm of breast: Secondary | ICD-10-CM | POA: Diagnosis not present

## 2021-08-31 IMAGING — MG MM DIGITAL SCREENING UNILAT*R* W/ TOMO W/ CAD
6 series · 6 of 18 positions shown · non-contrast
Comparison: Previous exam(s).

CLINICAL DATA: Screening.

EXAM:
DIGITAL SCREENING UNILATERAL RIGHT MAMMOGRAM WITH CAD AND
TOMOSYNTHESIS
TECHNIQUE: Right screening digital craniocaudal and mediolateral oblique
mammograms were obtained. Right screening digital breast
tomosynthesis was performed. The images were evaluated with
computer-aided detection.

[R CC synth-2D]
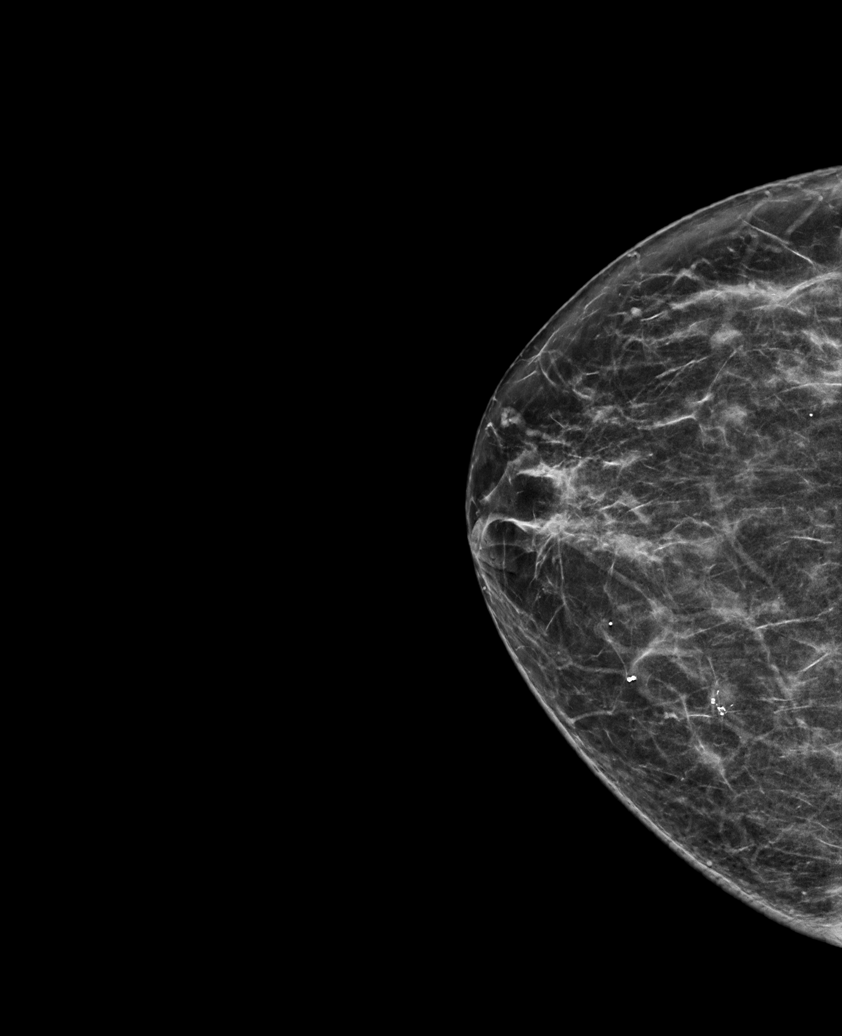

[R MLO synth-2D (1 of 2)]
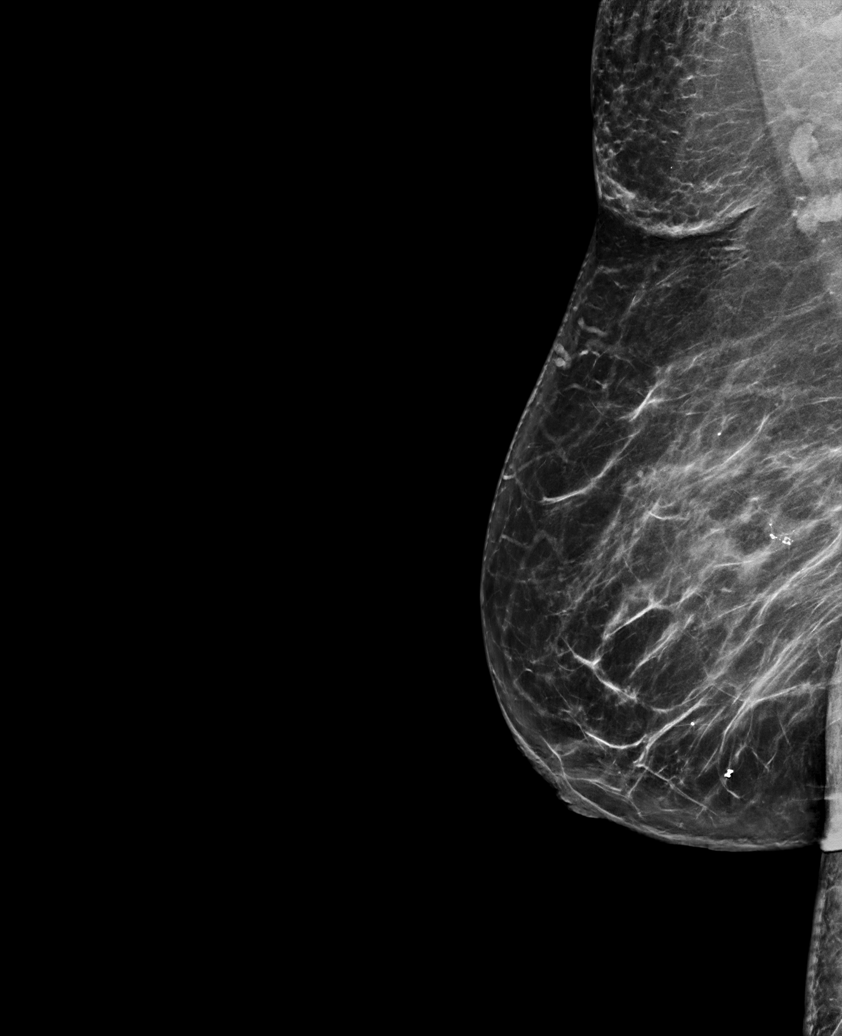

[R MLO synth-2D (2 of 2)]
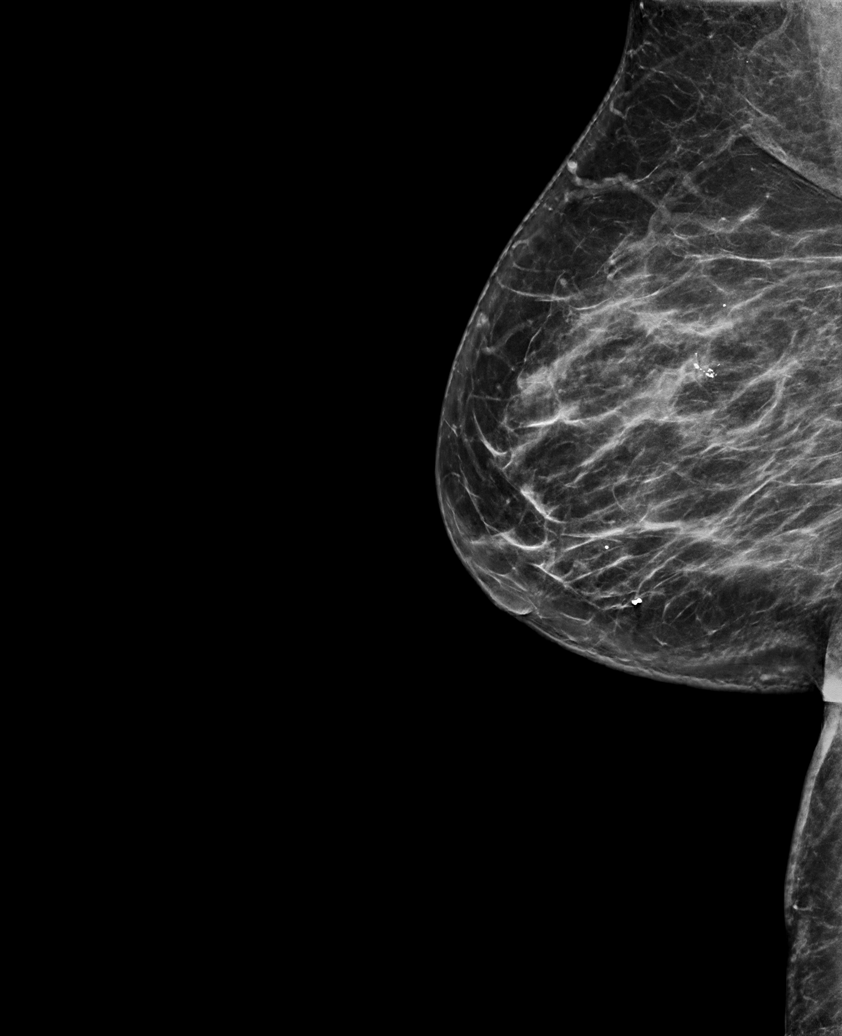

[R CC tomo · tomo slice 35/68.0]
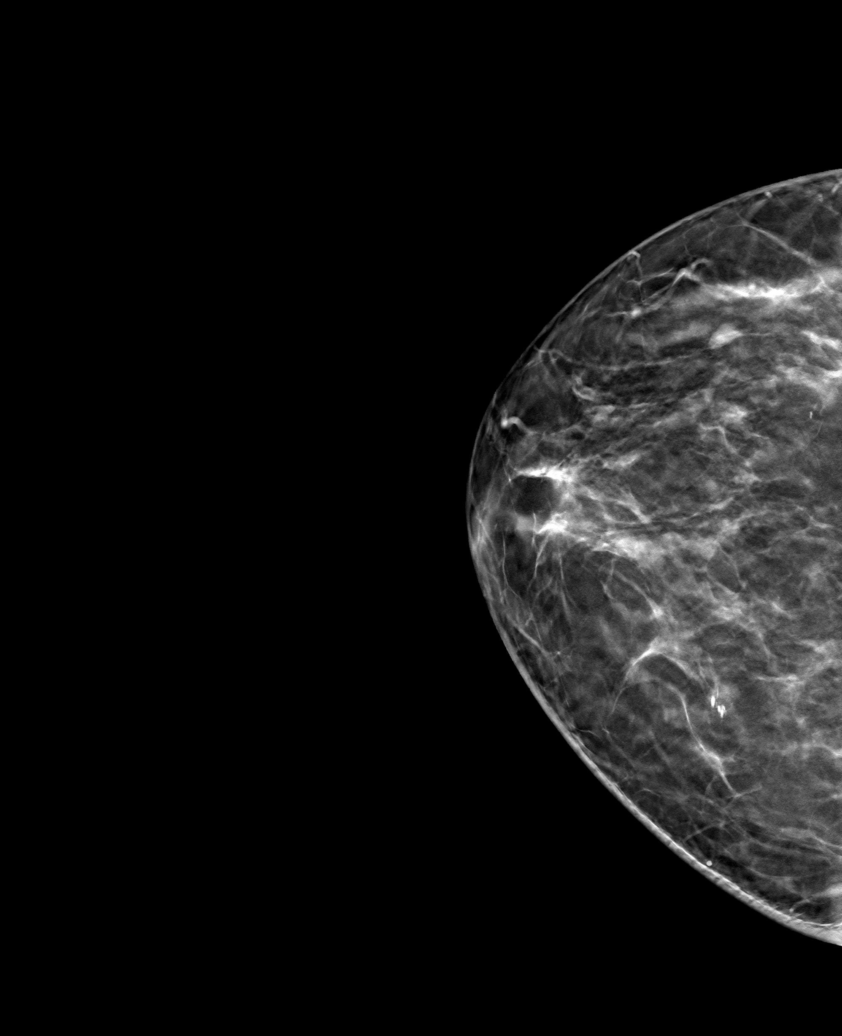

[R MLO tomo (1 of 2) · tomo slice 41/82.0]
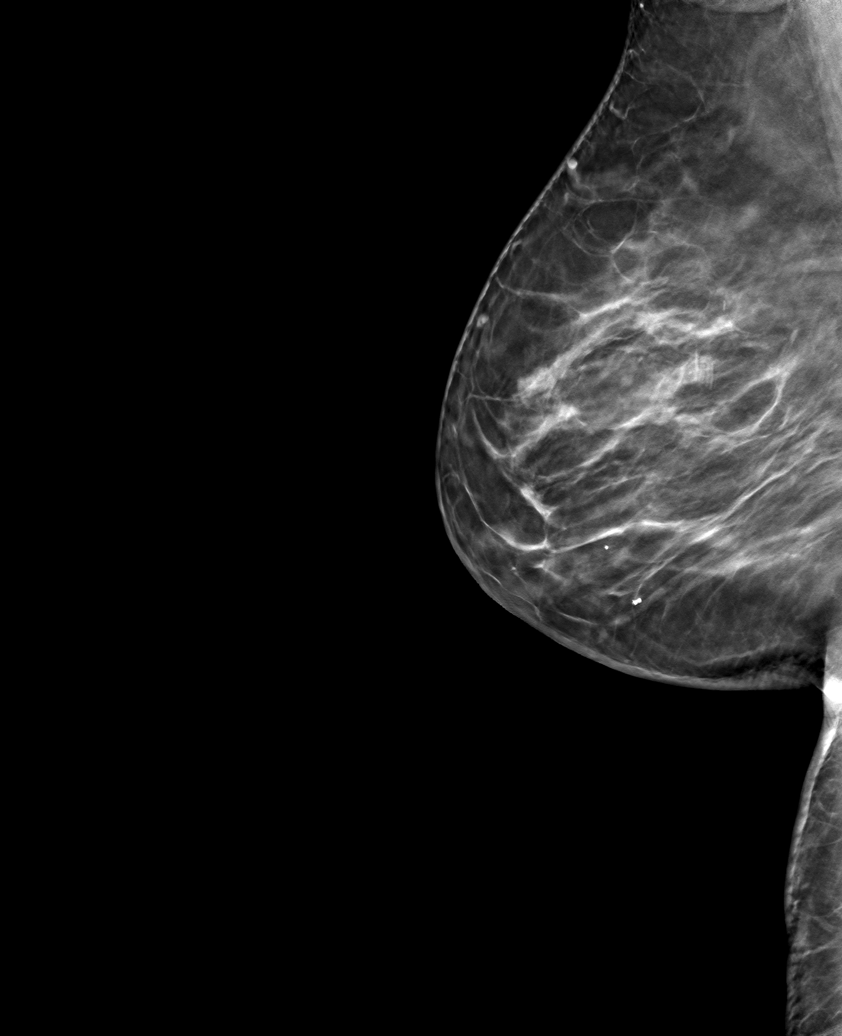

[R MLO tomo (2 of 2) · tomo slice 45/88.0]
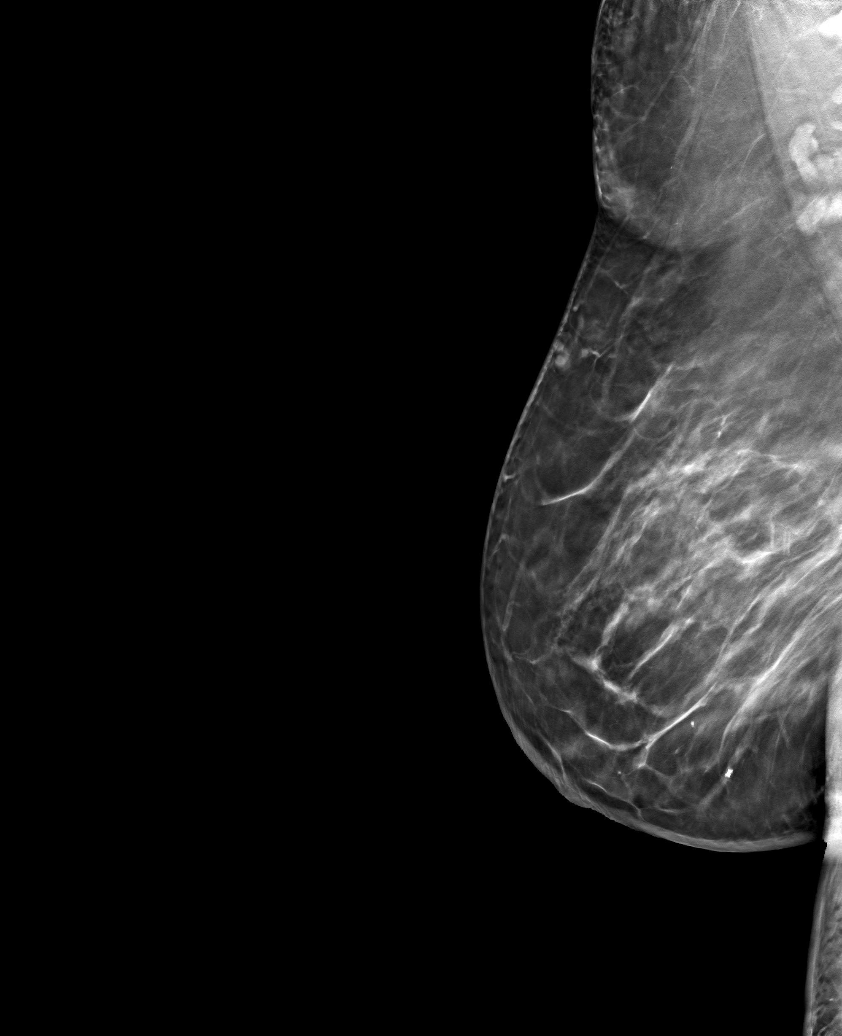

[6 of 18 positions shown; findings below may reference images not displayed]

ACR Breast Density Category b: There are scattered areas of
fibroglandular density.
FINDINGS: There are no findings suspicious for malignancy.
IMPRESSION: No mammographic evidence of malignancy. A result letter of this
screening mammogram will be mailed directly to the patient.

RECOMMENDATION:
Screening mammogram in one year. (Code:[2H])

BI-RADS CATEGORY  1: Negative.

## 2021-09-01 ENCOUNTER — Inpatient Hospital Stay: Payer: Medicare Other | Attending: Nurse Practitioner | Admitting: Hematology

## 2021-09-01 ENCOUNTER — Inpatient Hospital Stay: Payer: Medicare Other

## 2021-09-01 ENCOUNTER — Encounter: Payer: Self-pay | Admitting: Hematology

## 2021-09-01 VITALS — BP 105/62 | HR 65 | Temp 98.8°F | Resp 18 | Ht 63.0 in | Wt 213.0 lb

## 2021-09-01 DIAGNOSIS — C50412 Malignant neoplasm of upper-outer quadrant of left female breast: Secondary | ICD-10-CM | POA: Insufficient documentation

## 2021-09-01 DIAGNOSIS — Z5112 Encounter for antineoplastic immunotherapy: Secondary | ICD-10-CM | POA: Insufficient documentation

## 2021-09-01 DIAGNOSIS — Z170 Estrogen receptor positive status [ER+]: Secondary | ICD-10-CM | POA: Diagnosis not present

## 2021-09-01 MED ORDER — ACETAMINOPHEN 325 MG PO TABS
650.0000 mg | ORAL_TABLET | Freq: Once | ORAL | Status: AC
  Administered 2021-09-01: 14:00:00 650 mg via ORAL
  Filled 2021-09-01: qty 2

## 2021-09-01 MED ORDER — SODIUM CHLORIDE 0.9 % IV SOLN
6.0000 mg/kg | Freq: Once | INTRAVENOUS | Status: DC
Start: 2021-09-01 — End: 2021-09-01

## 2021-09-01 MED ORDER — TRASTUZUMAB-ANNS CHEMO 150 MG IV SOLR
8.0000 mg/kg | Freq: Once | INTRAVENOUS | Status: AC
  Administered 2021-09-01: 15:00:00 756 mg via INTRAVENOUS
  Filled 2021-09-01: qty 36

## 2021-09-01 MED ORDER — DIPHENHYDRAMINE HCL 25 MG PO CAPS
50.0000 mg | ORAL_CAPSULE | Freq: Once | ORAL | Status: AC
  Administered 2021-09-01: 14:00:00 50 mg via ORAL
  Filled 2021-09-01: qty 2

## 2021-09-01 MED ORDER — LACTULOSE 10 GM/15ML PO SOLN: 20.0000 g | Freq: Two times a day (BID) | ORAL | 0 refills | Status: DC | PRN

## 2021-09-01 MED ORDER — SODIUM CHLORIDE 0.9% FLUSH
10.0000 mL | INTRAVENOUS | Status: DC | PRN
  Administered 2021-09-01: 15:00:00 10 mL

## 2021-09-01 MED ORDER — HEPARIN SOD (PORK) LOCK FLUSH 100 UNIT/ML IV SOLN
500.0000 [IU] | Freq: Once | INTRAVENOUS | Status: AC | PRN
  Administered 2021-09-01: 15:00:00 500 [IU]

## 2021-09-01 MED ORDER — SODIUM CHLORIDE 0.9 % IV SOLN: Freq: Once | INTRAVENOUS | Status: AC

## 2021-09-01 NOTE — Progress Notes (Signed)
Pts last dose of trastuzumab was 6 weeks ago.  Dr. Burr Medico would like to reload trastuzumab today.  May infuse over 30 min.  Kennith Center, Pharm.D., CPP 09/01/2021@2 :37 PM

## 2021-09-01 NOTE — Progress Notes (Signed)
[place]   [phone] [phone]) [phone]   Clinic Follow up Note   Patient Care Team: Cipriano Mile, NP as PCP - General [name], RN as Oncology Nurse Navigator [name], RN as Oncology Nurse Navigator [name], MD as Consulting Physician (General Surgery) [name], MD as Consulting Physician (Hematology) [name], MD as Consulting Physician (Radiation Oncology) [name], [name], MD as Consulting Physician (Cardiology) Cipriano Mile, NP  Date of Service:  09/01/2021  CHIEF COMPLAINT: f/u of left breast cancer  CURRENT THERAPY:  Exemestane  Kanjinti q3 weeks, perjeta discontinued   ASSESSMENT & PLAN:  Cheryl Blankenship is a 70 y.o. female with   1. Malignant neoplasm of upper-outer quadrant of left breast, Stage II, c(T2N1M0), triple positive, Grade II  -She was diagnosed in 06/2020. She has 12cm calcification and 4.5cm mass in her left breast with 2 abnormal LN. 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  -Her MRI Breast from 07/31/20 shows very extensive disease in her left breast and 3 abnormal LNs in the left axilla.  -staging scans 07/2020 showed no distant metastasis -She started neoadjuvant chemo THP q3weeks on 08/05/20 which was dose reduced with C1. Tolerated poorly with body aches, n/v/d. She was switched to [facility] but also tolerated poorly, DC'd after 2 cycles. -She began exemestane 12/10/20, tolerating well with hot and cold flashes -S/p left mastectomy on 12/30/20 under Dr. [name]. Pathology showed: focal residual invasive ductal carcinoma s/p neoadjuvant treatment; residual DCIS; margins of resection not involved; all 5 lymph nodes negative (0/5).  -she completed adjuvant RT per Dr. Lisbeth Renshaw 04/07/21 -herceptin/perjeta and exemestane on hold since 04/2021 for weakness, neuro and syncope work up -resumed exemestane and herceptin only 06/22/21. Treatment was again held due to her recent  hospital stay for hyperglycemia and pneumonia.  Her last dose was 10/31. Insurance has denied herceptin injections. -right MM on 08/31/21 was negative. -recent eco was also normal  -She is clinically stable, with fatigue and other nonspecific symptoms. -she was not scheduled for herceptin today, so we will try to squeeze her in. She agrees to proceed.  I called her daughter also and she agrees with continuing the treatment plan.  2. Symptom Management: Fatigue, Weakness, Constipation -I discussed that her fatigue and weakness are likely related to her other comorbidities. While the exemestane may play a role, it is not the sole contributor. -she reports continued constipation despite OTC laxatives, such as miralax. I advised her to try milk of magnesium, and I also prescribed lactulose if that doesn't work for her.   3. Parkinson's Disease, diagnosed in Sep 2022 -she experienced fatigue, balance issue, weakness, syncope -cardiology work up showed euvolemia, normal EKG. Felt to be related to vasovagal secondary to dehydration/poor po intake -neurology work up by Dr. Mickeal Skinner revealed Parkindon's disease. She started requip and lyrica  -improved, no recent syncopal episode   4. CHF, DM with neuropathy, HTN, arthritis -Follow-up cardiology, PCP -Monitoring fluid status -most recent echo 07/08/21 was normal. -she was hospitalized on 08/03/21 for hypoglycemia. She reports her DM medications have been decreased.     PLAN: -proceed with trastuzumab with loading dose today -Trastuzumab every 3 weeks x3 -lab and f/u in 6 weeks    No problem-specific Assessment & Plan notes found for this encounter.   SUMMARY OF ONCOLOGIC HISTORY: Oncology History Overview Note  Cancer Staging Malignant neoplasm of upper-outer quadrant of left breast in female, estrogen receptor positive ([place])  Staging form: Breast, AJCC 8th Edition - Clinical stage from 07/16/2020: Stage IB (cT2, cN1, cM0, G2, ER+, PR+,  HER2+) - Signed by [name], MD on 07/21/2020    Malignant neoplasm of upper-outer quadrant of left breast in female, estrogen receptor positive ([place])  07/01/2020 Mammogram   IMPRESSION: 1. There is a 12 cm span of highly suspicious calcifications involving the upper outer and upper inner quadrants of the left breast. On ultrasound, within this area of calcifications, there are confluent masses at 1 o'clock spanning at least 4.5 cm. An additional suspicious mass is seen in the left breast at 2 o'clock.  -Ultrasound targeted to the left breast at 1 o'clock, 5 cm from the nipple demonstrates a broad area of hypoechoic irregular tissue/confluent masses spanning at least 4.5 cm. There is a separate oval hypoechoic mass with indistinct margins at 2 o'clock, 7 cm from the nipple measuring 0.9 x 0.4 x 0.9 cm. Ultrasound of the left axilla demonstrates 2 abnormally thickened lymph nodes with cortices measuring 6-7 mm. There is an additional lymph node with a borderline cortex of 4 mm.   2. There are 2 lymph nodes with thickened cortices in the left axilla, and an additional lymph node with a borderline thickened cortex.   07/16/2020 Cancer Staging   Staging form: Breast, AJCC 8th Edition - Clinical stage from 07/16/2020: Stage IIA (cT3, cN1, cM0, G2, ER+, PR+, HER2+) - Signed by [name], MD on 08/05/2020    07/16/2020 Initial Biopsy   Diagnosis 1. 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 Breast, left, needle core biopsy, 1 o'clock - INVASIVE DUCTAL CARCINOMA - SEE COMMENT 3. Lymph node, biopsy - METASTATIC CARCINOMA INVOLVING A LYMPH NODE - SEE COMMENT Microscopic Comment 1. Based on the biopsy, the ductal carcinoma in situ has a comedo pattern, high nuclear grade and measures 0.7 cm in greatest linear extent. Prognostic markers (ER/PR) are pending and will be reported in  an addendum. 2. Based on the biopsy, the carcinoma appears Nottingham grade 2 of 3 and measures 1.5 cm in greatest linear extent. Prognostic markers (ER/PR/ki-67/HER2) are pending and will be reported in an addendum. Dr. Saralyn Pilar reviewed the case and agrees with the above diagnosis. These results were called to [facility] on July 17, 2020. 3. Based on the biopsy, the carcinoma appears measures 1.0 cm in greatest linear extent. Prognostic markers (ER/PR/ki-67/HER2) are pending and will be reported in an addendum.   07/16/2020 Receptors her2   1. PROGNOSTIC INDICATORS Results: IMMUNOHISTOCHEMICAL AND MORPHOMETRIC ANALYSIS PERFORMED MANUALLY Estrogen Receptor: 30%, POSITIVE, STRONG STAINING INTENSITY Progesterone Receptor: 5%, POSITIVE, STRONG STAINING INTENSITY  2. PROGNOSTIC INDICATORS Results: IMMUNOHISTOCHEMICAL AND MORPHOMETRIC ANALYSIS PERFORMED MANUALLY The tumor cells are POSITIVE for Her2 (3+). Estrogen Receptor: 85%, POSITIVE, STRONG STAINING INTENSITY Progesterone Receptor: 60%, POSITIVE, STRONG STAINING INTENSITY Proliferation Marker Ki67: 25%  3. PROGNOSTIC INDICATORS Results: IMMUNOHISTOCHEMICAL AND MORPHOMETRIC ANALYSIS PERFORMED MANUALLY The tumor cells are POSITIVE for Her2 (3+). Estrogen Receptor: 95%, POSITIVE, STRONG STAINING INTENSITY Progesterone Receptor: 45%, POSITIVE, STRONG STAINING INTENSITY Proliferation Marker Ki67: 30%   07/20/2020 Initial Diagnosis   Malignant neoplasm of upper-outer quadrant of left breast in female, estrogen receptor positive ([place])   07/31/2020 Echocardiogram   Baseline Echo  IMPRESSIONS     1. Left ventricular ejection fraction, by estimation, is 65 to 70%. The  left ventricle has normal function. The left ventricle has no regional  wall motion abnormalities. Left ventricular diastolic parameters are  indeterminate. The average left  ventricular global longitudinal strain is -17.1 %.   2. Right  ventricular systolic function is normal. The right ventricular  size is normal. There is normal pulmonary artery systolic pressure.   3. The mitral valve is normal in structure. Trivial mitral valve  regurgitation. No evidence of mitral stenosis.   4. The aortic valve is normal in structure. Aortic valve regurgitation is  not visualized. No aortic stenosis is present.    07/31/2020 Breast MRI   IMPRESSION: 1. Extensive non mass enhancement within the left breast extending from the nipple to the chest wall. Overall findings are most compatible with extensive malignancy involving the majority of the left breast. 2. Indeterminate enhancing mass within the upper inner right breast. 3. Three morphologically abnormal lymph nodes within the left axilla. One of these nodes has been biopsied compatible with metastatic adenopathy.     08/03/2020 Imaging   Bone Scan whole body  IMPRESSION: Increased radiotracer uptake in the mid to lower lumbar regions of uncertain etiology. Correlation with radiography to assess for potential arthropathy in these areas advised. Increased uptake in major joints is likely of arthropathic etiology. Increased uptake in the mid face is likely due to paranasal sinus disease. Distribution of radiotracer uptake in bony structures elsewhere unremarkable.   08/04/2020 Procedure   PAC placement by Dr [name]   08/05/2020 - 10/28/2020 Neo-Adjuvant Chemotherapy   Neoadjuvant THP q3weeks for 4-6 cycles starting 08/05/20, followed by Herceptin or Kadcyla q3weeks to complete 1 year of treatment. Stopped THP after 1 dose due to poor toleration       -I switched her to [place] starting 08/25/20. Stopped after C2 due to poor toleration (10/28/20)   08/07/2020 Imaging   CT CAP  IMPRESSION: 2.7 cm mass in the upper outer left breast, likely corresponding to the patient's known primary breast neoplasm.   8 mm short axis left axillary node, suspicious for nodal  metastasis in this clinical context.   No findings suspicious for distant metastasis.   Endometrium is mildly prominent, measuring 11 mm. Correlate for vaginal bleeding and consider pelvic ultrasound for further evaluation if clinically warranted.   08/11/2020 Pathology Results   Diagnosis Breast, right, needle core biopsy, right breast - PAPILLARY LESION - SEE COMMENT Microscopic Comment The biopsy consists of a papillary lesion with a focal area of adenosis. Immunohistochemistry (SMM, calponin and p63) shows retention of the myoepithelial cell layer in the focus of adenosis. 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 These results were called to [facility] on August 12, 2020.   10/27/2020 Imaging   CT Angio chest  IMPRESSION: Slightly suboptimal opacification of the main pulmonary artery, however no central or proximal segmental pulmonary embolism   Minimal bibasilar atelectasis   Aortic Atherosclerosis (ICD10-I70.0).   10/31/2020 Imaging   CT AP  IMPRESSION: 1. No acute finding by CT other than a large amount of stool and gas in the right colon and gas in the transverse colon. No small bowel obstruction. 2. Previous cholecystectomy. 3. Aortic atherosclerosis. 4. Umbilical hernia containing only fat. 5. Small amount of fluid in the endometrial canal as seen previously.   Aortic Atherosclerosis (ICD10-I70.0).   12/04/2020 Imaging   MRI Breast IMPRESSION: 1. Evidence of a positive response to interval treatment. There has been a significant improvement of both the extent and strength of the enhancement throughout the outer LEFT breast when compared  to the earlier MRI of 07/31/2020. 2. However, there remains scattered areas of mass and non-mass enhancement throughout the outer LEFT breast, involving the upper outer quadrant and lower outer quadrant, as detailed above. 3. The 3 morphologically abnormal lymph nodes in the LEFT  axilla identified on previous MRI, 1 a biopsy-proven metastasis, are all slightly smaller indicating an additional positive response to interval treatment. 4. No evidence of malignancy within the RIGHT breast.   12/10/2020 -  Chemotherapy   Patient is on Treatment Plan : BREAST Trastuzumab + Perjeta (05/06/21) q21d     12/30/2020 Surgery   A. BREAST, LEFT, MASTECTOMY:  - Focal residual invasive ductal carcinoma status post neoadjuvant  treatment.  - Residual ductal carcinoma in situ.  - Margins of resection are not involved.  - Biopsy clip (X2).   B. LYMPH NODES, LEFT #1, DISSECTION:  - Three lymph nodes, negative for carcinoma (0/3).  - Biopsy clip (X1).   C. LYMPH NODES, LEFT #2, DISSECTION:  - Two lymph nodes, negative for carcinoma (0/2).   PROGNOSTIC INDICATOR RESULTS:  - The tumor cells are POSITIVE for Her2 (3+).  - Estrogen Receptor:       POSITIVE, 90%, STRONG STAINING  - Progesterone Receptor:   POSITIVE, 50%, MODERATE STAINING    12/30/2020 Cancer Staging   Staging form: Breast, AJCC 8th Edition - Pathologic stage from 12/30/2020: No Stage Recommended (ypT1a, pN0, cM0, G2, ER+, PR+, HER2+) - Signed by [name], MD on 01/20/2021 Stage prefix: Post-therapy Histologic grading system: 3 grade system Residual tumor (R): R0 - None    02/17/2021 - 04/08/2021 Radiation Therapy    Site Technique Total Dose (Gy) Dose per Fx (Gy) Completed Fx Beam Energies  Chest Wall, Left: CW_Lt 3D 50.4/50.4 1.8 28/28 10X  Chest Wall, Left: CW_Lt_SCLV 3D 50.4/50.4 1.8 28/28 6X, 15X  Chest Wall, Left: CW_Lt_Bst Electron 10/10 2 5/5 9E       INTERVAL HISTORY:  Shylo Zamor is here for a follow up of breast cancer. She was last seen by me on 07/19/21. She presents to the clinic alone, and we connected with her daughter on the phone. I asked about her hospitalization. She reports "I don't know what happened! I ate before I went out." She reports continued fatigue and weakness, as well as  constipation. I discussed that her fatigue is likely a combination of things, mainly her comorbidities.   All other systems were reviewed with the patient and are negative.  MEDICAL HISTORY:  Past Medical History:  Diagnosis Date   Anxiety    Breast cancer ([place])    Cancer ([place])    CHF (congestive heart failure) ([facility])    Congestive heart failure (CHF) ([facility])    Depression    Diabetes mellitus without complication ([place])    type 2   Diabetes mellitus without complication ([facility])    GERD (gastroesophageal reflux disease)    High cholesterol    History of kidney stones    Hypertension    PONV (postoperative nausea and vomiting)    Stroke ([place])     SURGICAL HISTORY: Past Surgical History:  Procedure Laterality Date   APPENDECTOMY     BREAST SURGERY     CHOLECYSTECTOMY     COLONOSCOPY     ESOPHAGOGASTRODUODENOSCOPY (EGD) WITH PROPOFOL N/A 11/01/2020   Procedure: ESOPHAGOGASTRODUODENOSCOPY (EGD) WITH PROPOFOL;  Surgeon: Arta Silence, MD;  Location: [facility];  Service: Endoscopy;  Laterality: N/A;   EYE SURGERY     cataracts   MASTECTOMY  MASTECTOMY WITH AXILLARY LYMPH NODE DISSECTION Left 12/30/2020   Procedure: LEFT MASTECTOMY WITH TARGETED AXILLARY LYMPH NODE DISSECTION;  Surgeon: [name], MD;  Location: [facility];  Service: General;  Laterality: Left;   MULTIPLE TOOTH EXTRACTIONS     PORTACATH PLACEMENT N/A 08/04/2020   Procedure: INSERTION PORT-A-CATH WITH ULTRASOUND GUIDANCE;  Surgeon: [name], MD;  Location: [facility];  Service: General;  Laterality: N/A;   STOMACH SURGERY      I have reviewed the social history and family history with the patient and they are unchanged from previous note.  ALLERGIES:  is allergic to coconut (cocos nucifera) allergy skin test, hydrochlorothiazide, lisinopril, coconut (cocos nucifera) allergy skin test, coconut oil, hydrochlorothiazide, other, and lisinopril.  MEDICATIONS:  Current Outpatient Medications  Medication Sig  Dispense Refill   DULoxetine (CYMBALTA) 30 MG capsule Take 1 tablet by mouth daily. 30 mg daily     Accu-Chek FastClix Lancets MISC 1 each by Other route in the morning, at noon, and at bedtime.     acetaminophen (TYLENOL) 500 MG tablet Take 1 tablet (500 mg total) by mouth every 6 (six) hours as needed for moderate pain, headache or mild pain. 20 tablet 0   albuterol (VENTOLIN HFA) 108 (90 Base) MCG/ACT inhaler Inhale 1-2 puffs into the lungs every 4 (four) hours as needed for wheezing or shortness of breath. 8 g 0   ALPRAZolam (XANAX) 0.25 MG tablet Take 1-2 tablets (0.25-0.5 mg total) by mouth 3 (three) times daily as needed for anxiety or sleep. 30 tablet 0   amLODipine (NORVASC) 10 MG tablet Take 10 mg by mouth daily.     aspirin 81 MG chewable tablet Chew 81 mg by mouth daily.      bisacodyl (DULCOLAX) 5 MG EC tablet Take 1 tablet (5 mg total) by mouth daily as needed for moderate constipation. 30 tablet 0   carvedilol (COREG) 25 MG tablet Take 25 mg by mouth 2 (two) times daily with a meal.     cholecalciferol (VITAMIN D) 25 MCG (1000 UNIT) tablet Take 1,000 Units by mouth at bedtime.     cycloSPORINE (RESTASIS) 0.05 % ophthalmic emulsion Place 1 drop into both eyes 2 (two) times daily.     diphenoxylate-atropine (LOMOTIL) 2.5-0.025 MG tablet Take 1-2 tablets by mouth 4 (four) times daily as needed for diarrhea or loose stools.     DULoxetine (CYMBALTA) 30 MG capsule Take 30 mg by mouth daily.     exemestane (AROMASIN) 25 MG tablet Take 1 tablet (25 mg total) by mouth daily after breakfast. 90 tablet 1   folic acid (FOLVITE) 1 MG tablet Take 1 mg by mouth daily.     furosemide (LASIX) 20 MG tablet Take 1 tablet (20 mg total) by mouth daily as needed (for swelling or weight gain). 90 tablet 3   gabapentin (NEURONTIN) 400 MG capsule Take 400 mg by mouth daily.     Insulin Aspart (NOVOLOG FLEXPEN [place]) Inject 0-10 Units into the skin with breakfast, with lunch, and with evening meal. 0-150 = 0  units 150-199 = 2 units 200-249 = 4 units 250-299 = 6 units 300-349 = 8 units Greater than 350 = 10 units     insulin glargine (LANTUS) 100 UNIT/ML injection Inject 0.3 mLs (30 Units total) into the skin 2 (two) times daily. 10 mL 0   lactulose (CHRONULAC) 10 GM/15ML solution Take 30 g by mouth daily.     lidocaine-prilocaine (EMLA) cream Apply 1 application topically as needed (pain). (  Patient taking differently: Apply 1 application topically daily.) 30 g 3   losartan (COZAAR) 100 MG tablet Take 1 tablet (100 mg total) by mouth daily. 90 tablet 3   metFORMIN (GLUCOPHAGE) 500 MG tablet Take 500 mg by mouth 2 (two) times daily with a meal.      methocarbamol (ROBAXIN) 500 MG tablet Take 500 mg by mouth every 8 (eight) hours as needed for muscle spasms.     Omega-3 Fatty Acids (FISH OIL) 1000 MG CAPS Take 1,000 mg by mouth daily.     omeprazole (PRILOSEC) 20 MG capsule Take 1 capsule (20 mg total) by mouth daily. 30 capsule 11   ondansetron (ZOFRAN-ODT) 4 MG disintegrating tablet Take 4 mg by mouth 2 (two) times daily as needed for nausea or vomiting.     potassium chloride (MICRO-K) 10 MEQ CR capsule Take 10 mEq by mouth daily.     pregabalin (LYRICA) 75 MG capsule Take 1 capsule (75 mg total) by mouth 2 (two) times daily. 60 capsule 3   prochlorperazine (COMPAZINE) 10 MG tablet Take 1 tablet (10 mg total) by mouth every 6 (six) hours as needed for nausea or vomiting. 30 tablet 5   PROMETHEGAN 25 MG suppository place 1 SUPPOSITORY rectally EVERY 8 HOURS AS NEEDED FOR NAUSEA AND VOMITING 30 suppository 1   RESTASIS 0.05 % ophthalmic emulsion Place 1 drop into both eyes 2 (two) times daily as needed (dry eyes).     rOPINIRole (REQUIP) 0.25 MG tablet TAKE 1 TABLET 3 TIMES DAILY x7 DAYS, THEN TAKE 2 TABLETS 3 TIMES DAILY x7 DAYS, THEN TAKE 3 TABLETS 3 TIMES DAILY x7 DAYS, THEN TAKE 4 TABLETS 3 TIMES DAILY 90 tablet 1   rosuvastatin (CRESTOR) 5 MG tablet Take 5 mg by mouth at bedtime.      spironolactone (ALDACTONE) 25 MG tablet Take 1 tablet (25 mg total) by mouth daily. 90 tablet 3   sucralfate (CARAFATE) 1 GM/10ML suspension Take 10 mLs (1 g total) by mouth 4 (four) times daily -  with meals and at bedtime. 420 mL 0   torsemide (DEMADEX) 20 MG tablet Take 40 mg by mouth 2 (two) times daily.     vitamin B-12 (CYANOCOBALAMIN) 1000 MCG tablet Take 1,000 mcg by mouth daily.     zolpidem (AMBIEN) 5 MG tablet Take 1 tablet (5 mg total) by mouth at bedtime as needed for sleep. 20 tablet 0   No current facility-administered medications for this visit.    PHYSICAL EXAMINATION: ECOG PERFORMANCE STATUS: 3 - Symptomatic, >50% confined to bed  Vitals:   09/01/21 1320  BP: 105/62  Pulse: 65  Resp: 18  Temp: 98.8 F (37.1 C)  SpO2: 97%   Wt Readings from Last 3 Encounters:  09/01/21 213 lb (96.6 kg)  08/04/21 232 lb 12.9 oz (105.6 kg)  07/19/21 214 lb 12.8 oz (97.4 kg)     GENERAL:alert, no distress and comfortable SKIN: skin color, texture, turgor are normal, no rashes or significant lesions EYES: normal, Conjunctiva are pink and non-injected, sclera clear NEURO: alert & oriented x 3 with fluent speech, no focal motor/sensory deficits BREAST: some soft tissue swelling to the left mastectomy site. No palpable mass, nodules or adenopathy bilaterally. Breast exam benign.   LABORATORY DATA:  I have reviewed the data as listed CBC Latest Ref Rng & Units 08/06/2021 08/04/2021 08/03/2021  WBC 4.0 - 10.5 K/uL 5.0 7.0 6.2  Hemoglobin 12.0 - 15.0 g/dL 10.0(L) 11.7(L) 12.1  Hematocrit 36.0 - 46.0 %  31.7(L) 37.9 38.5  Platelets 150 - 400 K/uL 230 250 240     CMP Latest Ref Rng & Units 08/08/2021 08/07/2021 08/06/2021  Glucose 70 - 99 mg/dL 224(H) 315(H) 79  BUN 8 - 23 mg/dL 25(H) 26(H) 20  Creatinine 0.44 - 1.00 mg/dL 0.94 1.20(H) 1.12(H)  Sodium 135 - 145 mmol/L 137 135 138  Potassium 3.5 - 5.1 mmol/L 4.7 5.0 4.1  Chloride 98 - 111 mmol/L 104 102 105  CO2 22 - 32 mmol/L _0 Calcium 8.9 - 10.3 mg/dL 10.1 10.2 10.3  Total Protein 6.5 - 8.1 g/dL - - 6.4(L)  Total Bilirubin 0.3 - 1.2 mg/dL - - 0.5  Alkaline Phos 38 - 126 U/L - - 49  AST 15 - 41 U/L - - 10(L)  ALT 0 - 44 U/L - - 13      RADIOGRAPHIC STUDIES: I have personally reviewed the radiological images as listed and agreed with the findings in the report. MM 3D SCREEN BREAST UNI RIGHT  Result Date: 08/31/2021 CLINICAL DATA:  Screening. EXAM: DIGITAL SCREENING UNILATERAL RIGHT MAMMOGRAM WITH CAD AND TOMOSYNTHESIS TECHNIQUE: Right screening digital craniocaudal and mediolateral oblique mammograms were obtained. Right screening digital breast tomosynthesis was performed. The images were evaluated with computer-aided detection. COMPARISON:  Previous exam(s). ACR Breast Density Category b: There are scattered areas of fibroglandular density. FINDINGS: There are no findings suspicious for malignancy. IMPRESSION: No mammographic evidence of malignancy. A result letter of this screening mammogram will be mailed directly to the patient. RECOMMENDATION: Screening mammogram in one year. (Code:SM-B-01Y) BI-RADS CATEGORY  1: Negative. Electronically Signed   By: Marin Olp M.D.   On: 08/31/2021 16:46      No orders of the defined types were placed in this encounter.  All questions were answered. The patient knows to call the clinic with any problems, questions or concerns. No barriers to learning was detected. The total time spent in the appointment was 30 minutes.     [name], MD 09/01/2021   I, Wilburn Mylar, am acting as scribe for [name], MD.   I have reviewed the above documentation for accuracy and completeness, and I agree with the above.

## 2021-09-01 NOTE — Patient Instructions (Signed)
[facility]  Discharge Instructions: ?Thank you for choosing [facility] to provide your oncology and hematology care.  ? ?If you have a lab appointment with the Cancer Center, please go directly to the Cancer Center and check in at the registration area. ?  ?Wear comfortable clothing and clothing appropriate for easy access to any Portacath or PICC line.  ? ?We strive to give you quality time with your provider. You may need to reschedule your appointment if you arrive late (15 or more minutes).  Arriving late affects you and other patients whose appointments are after yours.  Also, if you miss three or more appointments without notifying the office, you may be dismissed from the clinic at the provider?s discretion.    ?  ?For prescription refill requests, have your pharmacy contact our office and allow 72 hours for refills to be completed.   ? ?Today you received the following chemotherapy and/or immunotherapy agent: Trastuzumab ?  ?To help prevent nausea and vomiting after your treatment, we encourage you to take your nausea medication as directed. ? ?BELOW ARE SYMPTOMS THAT SHOULD BE REPORTED IMMEDIATELY: ?*FEVER GREATER THAN 100.4 F (38 ?C) OR HIGHER ?*CHILLS OR SWEATING ?*NAUSEA AND VOMITING THAT IS NOT CONTROLLED WITH YOUR NAUSEA MEDICATION ?*UNUSUAL SHORTNESS OF BREATH ?*UNUSUAL BRUISING OR BLEEDING ?*URINARY PROBLEMS (pain or burning when urinating, or frequent urination) ?*BOWEL PROBLEMS (unusual diarrhea, constipation, pain near the anus) ?TENDERNESS IN MOUTH AND THROAT WITH OR WITHOUT PRESENCE OF ULCERS (sore throat, sores in mouth, or a toothache) ?UNUSUAL RASH, SWELLING OR PAIN  ?UNUSUAL VAGINAL DISCHARGE OR ITCHING  ? ?Items with * indicate a potential emergency and should be followed up as soon as possible or go to the Emergency Department if any problems should occur. ? ?Please show the CHEMOTHERAPY ALERT CARD or IMMUNOTHERAPY ALERT CARD at check-in to  the Emergency Department and triage nurse. ? ?Should you have questions after your visit or need to cancel or reschedule your appointment, please contact [facility]  Dept: [phone]  and follow the prompts.  Office hours are 8:00 a.m. to 4:30 p.m. Monday - Friday. Please note that voicemails left after 4:00 p.m. may not be returned until the following business day.  We are closed weekends and major holidays. You have access to a nurse at all times for urgent questions. Please call the main number to the clinic Dept: [phone] and follow the prompts. ? ? ?For any non-urgent questions, you may also contact your provider using MyChart. We now offer e-Visits for anyone 18 and older to request care online for non-urgent symptoms. For details visit [email or web address]. ?  ?Also download the MyChart app! Go to the app store, search "MyChart", open the app, select , and log in with your MyChart username and password. ? ?Due to Covid, a mask is required upon entering the hospital/clinic. If you do not have a mask, one will be given to you upon arrival. For doctor visits, patients may have 1 support person aged 18 or older with them. For treatment visits, patients cannot have anyone with them due to current Covid guidelines and our immunocompromised population.  ? ?

## 2021-09-02 ENCOUNTER — Telehealth: Payer: Self-pay | Admitting: Hematology

## 2021-09-02 NOTE — Telephone Encounter (Signed)
Scheduled follow-up appointments and cancelled upcoming appointment per 12/14 los. Patient's daughter is aware.

## 2021-09-06 ENCOUNTER — Other Ambulatory Visit: Payer: Self-pay | Admitting: Hematology

## 2021-09-15 ENCOUNTER — Ambulatory Visit: Payer: Medicare HMO

## 2021-09-15 ENCOUNTER — Ambulatory Visit: Payer: Medicare HMO | Admitting: Hematology

## 2021-09-15 ENCOUNTER — Other Ambulatory Visit: Payer: Medicare HMO

## 2021-09-15 ENCOUNTER — Encounter: Payer: Self-pay | Admitting: *Deleted

## 2021-09-21 ENCOUNTER — Encounter: Payer: Self-pay | Admitting: Hematology

## 2021-09-22 ENCOUNTER — Other Ambulatory Visit: Payer: Self-pay

## 2021-09-22 DIAGNOSIS — Z170 Estrogen receptor positive status [ER+]: Secondary | ICD-10-CM

## 2021-09-23 ENCOUNTER — Ambulatory Visit: Payer: Medicare Other

## 2021-09-23 ENCOUNTER — Other Ambulatory Visit: Payer: Medicare Other

## 2021-09-30 ENCOUNTER — Telehealth: Payer: Self-pay | Admitting: *Deleted

## 2021-09-30 NOTE — Telephone Encounter (Signed)
[facility] called to advise that they were packing medications for this patient and patient had both gabapentin and lyrica to be packed.  Gabapentin was refilled by PCP.  Reviewed last office note that showed Dr Mickeal Skinner discontinued the gabapentin due to failure of efficacy and started Lyrica.

## 2021-10-08 ENCOUNTER — Encounter: Payer: Self-pay | Admitting: Hematology

## 2021-10-11 ENCOUNTER — Other Ambulatory Visit: Payer: Self-pay | Admitting: Hematology

## 2021-10-13 ENCOUNTER — Telehealth: Payer: Self-pay | Admitting: Hematology

## 2021-10-13 ENCOUNTER — Inpatient Hospital Stay: Payer: Medicare HMO

## 2021-10-13 ENCOUNTER — Encounter: Payer: Self-pay | Admitting: *Deleted

## 2021-10-13 ENCOUNTER — Inpatient Hospital Stay: Payer: Medicare HMO | Admitting: Hematology

## 2021-10-13 NOTE — Telephone Encounter (Signed)
Sch per 1/25 inbasket, spoke with pt daughter, pt daughter aware

## 2021-10-20 ENCOUNTER — Inpatient Hospital Stay: Payer: Medicare HMO

## 2021-10-20 ENCOUNTER — Inpatient Hospital Stay: Payer: Medicare HMO | Attending: Nurse Practitioner | Admitting: Hematology

## 2021-10-20 ENCOUNTER — Encounter: Payer: Self-pay | Admitting: Hematology

## 2021-10-20 DIAGNOSIS — G20 Parkinson's disease: Secondary | ICD-10-CM | POA: Insufficient documentation

## 2021-10-20 DIAGNOSIS — E1140 Type 2 diabetes mellitus with diabetic neuropathy, unspecified: Secondary | ICD-10-CM | POA: Insufficient documentation

## 2021-10-20 DIAGNOSIS — C50412 Malignant neoplasm of upper-outer quadrant of left female breast: Secondary | ICD-10-CM | POA: Insufficient documentation

## 2021-10-20 DIAGNOSIS — I110 Hypertensive heart disease with heart failure: Secondary | ICD-10-CM | POA: Insufficient documentation

## 2021-10-20 DIAGNOSIS — Z794 Long term (current) use of insulin: Secondary | ICD-10-CM | POA: Insufficient documentation

## 2021-10-20 DIAGNOSIS — Z170 Estrogen receptor positive status [ER+]: Secondary | ICD-10-CM | POA: Insufficient documentation

## 2021-10-20 DIAGNOSIS — I509 Heart failure, unspecified: Secondary | ICD-10-CM | POA: Insufficient documentation

## 2021-10-20 DIAGNOSIS — N179 Acute kidney failure, unspecified: Secondary | ICD-10-CM | POA: Insufficient documentation

## 2021-10-21 ENCOUNTER — Encounter: Payer: Self-pay | Admitting: *Deleted

## 2021-10-21 ENCOUNTER — Telehealth: Payer: Self-pay | Admitting: Hematology

## 2021-10-21 NOTE — Telephone Encounter (Signed)
Scheduled per 2/2 in basket, message has been left

## 2021-10-25 ENCOUNTER — Inpatient Hospital Stay: Payer: Medicare HMO

## 2021-10-25 ENCOUNTER — Inpatient Hospital Stay: Payer: Medicare HMO | Admitting: Hematology

## 2021-10-26 ENCOUNTER — Inpatient Hospital Stay: Payer: Medicare HMO

## 2021-10-26 ENCOUNTER — Other Ambulatory Visit: Payer: Self-pay

## 2021-10-26 ENCOUNTER — Inpatient Hospital Stay (HOSPITAL_BASED_OUTPATIENT_CLINIC_OR_DEPARTMENT_OTHER): Payer: Medicare HMO | Admitting: Hematology

## 2021-10-26 VITALS — BP 132/63 | HR 69 | Temp 98.4°F | Resp 17 | Ht 63.0 in | Wt 211.6 lb

## 2021-10-26 DIAGNOSIS — C50412 Malignant neoplasm of upper-outer quadrant of left female breast: Secondary | ICD-10-CM | POA: Diagnosis not present

## 2021-10-26 DIAGNOSIS — Z170 Estrogen receptor positive status [ER+]: Secondary | ICD-10-CM

## 2021-10-26 DIAGNOSIS — Z95828 Presence of other vascular implants and grafts: Secondary | ICD-10-CM

## 2021-10-26 DIAGNOSIS — Z794 Long term (current) use of insulin: Secondary | ICD-10-CM | POA: Diagnosis not present

## 2021-10-26 DIAGNOSIS — N179 Acute kidney failure, unspecified: Secondary | ICD-10-CM | POA: Diagnosis not present

## 2021-10-26 DIAGNOSIS — I110 Hypertensive heart disease with heart failure: Secondary | ICD-10-CM | POA: Diagnosis not present

## 2021-10-26 DIAGNOSIS — I509 Heart failure, unspecified: Secondary | ICD-10-CM | POA: Diagnosis not present

## 2021-10-26 DIAGNOSIS — G20 Parkinson's disease: Secondary | ICD-10-CM | POA: Diagnosis not present

## 2021-10-26 DIAGNOSIS — E1140 Type 2 diabetes mellitus with diabetic neuropathy, unspecified: Secondary | ICD-10-CM | POA: Diagnosis not present

## 2021-10-26 LAB — CBC WITH DIFFERENTIAL (CANCER CENTER ONLY)
Abs Immature Granulocytes: 0.01 10*3/uL (ref 0.00–0.07)
Basophils Absolute: 0 10*3/uL (ref 0.0–0.1)
Basophils Relative: 1 %
Eosinophils Absolute: 0.2 10*3/uL (ref 0.0–0.5)
Eosinophils Relative: 3 %
HCT: 34.1 % — ABNORMAL LOW (ref 36.0–46.0)
Hemoglobin: 10.9 g/dL — ABNORMAL LOW (ref 12.0–15.0)
Immature Granulocytes: 0 %
Lymphocytes Relative: 25 %
Lymphs Abs: 1.4 10*3/uL (ref 0.7–4.0)
MCH: 27.5 pg (ref 26.0–34.0)
MCHC: 32 g/dL (ref 30.0–36.0)
MCV: 85.9 fL (ref 80.0–100.0)
Monocytes Absolute: 0.4 10*3/uL (ref 0.1–1.0)
Monocytes Relative: 7 %
Neutro Abs: 3.5 10*3/uL (ref 1.7–7.7)
Neutrophils Relative %: 64 %
Platelet Count: 235 10*3/uL (ref 150–400)
RBC: 3.97 MIL/uL (ref 3.87–5.11)
RDW: 12.9 % (ref 11.5–15.5)
WBC Count: 5.5 10*3/uL (ref 4.0–10.5)
nRBC: 0 % (ref 0.0–0.2)

## 2021-10-26 LAB — CMP (CANCER CENTER ONLY)
ALT: 10 U/L (ref 0–44)
AST: 12 U/L — ABNORMAL LOW (ref 15–41)
Albumin: 3.9 g/dL (ref 3.5–5.0)
Alkaline Phosphatase: 63 U/L (ref 38–126)
Anion gap: 7 (ref 5–15)
BUN: 47 mg/dL — ABNORMAL HIGH (ref 8–23)
CO2: 28 mmol/L (ref 22–32)
Calcium: 10.3 mg/dL (ref 8.9–10.3)
Chloride: 102 mmol/L (ref 98–111)
Creatinine: 2.01 mg/dL — ABNORMAL HIGH (ref 0.44–1.00)
GFR, Estimated: 27 mL/min — ABNORMAL LOW (ref >60)
Glucose, Bld: 297 mg/dL — ABNORMAL HIGH (ref 70–99)
Potassium: 4.5 mmol/L (ref 3.5–5.1)
Sodium: 137 mmol/L (ref 135–145)
Total Bilirubin: 0.5 mg/dL (ref 0.3–1.2)
Total Protein: 7 g/dL (ref 6.5–8.1)

## 2021-10-26 MED ORDER — EXEMESTANE 25 MG PO TABS: 25.0000 mg | ORAL_TABLET | Freq: Every day | ORAL | 1 refills | Status: DC

## 2021-10-26 MED ORDER — HEPARIN SOD (PORK) LOCK FLUSH 100 UNIT/ML IV SOLN
500.0000 [IU] | INTRAVENOUS | Status: AC | PRN
  Administered 2021-10-26: 500 [IU]

## 2021-10-26 MED ORDER — SODIUM CHLORIDE 0.9% FLUSH
10.0000 mL | INTRAVENOUS | Status: AC | PRN
  Administered 2021-10-26: 10 mL

## 2021-10-26 NOTE — Progress Notes (Signed)
[name]   [phone]) [phone] [phone]   Clinic Follow up Note   Patient Care Team: Cipriano Mile, NP as PCP - General [name], RN as Oncology Nurse Navigator [name], RN as Oncology Nurse Navigator [name], MD as Consulting Physician (General Surgery) [name], MD as Consulting Physician (Hematology) [name], MD as Consulting Physician (Radiation Oncology) [name], [name], MD as Consulting Physician (Cardiology) Cipriano Mile, NP  Date of Service:  10/26/2021  CHIEF COMPLAINT: f/u of left breast cancer  CURRENT THERAPY:  Exemestane  Kanjinti q3 weeks, perjeta discontinued   ASSESSMENT & PLAN:  Cheryl Blankenship is a 71 y.o. female with   1. Malignant neoplasm of upper-outer quadrant of left breast, Stage II, c(T2N1M0), triple positive, Grade II  -She was diagnosed in 06/2020. She has 12cm calcification and 4.5cm mass in her left breast with 2 abnormal LN. 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  -Her MRI Breast from 07/31/20 shows very extensive disease in her left breast and 3 abnormal LNs in the left axilla.  -staging scans 07/2020 showed no distant metastasis -She started neoadjuvant chemo THP q3weeks on 08/05/20 which was dose reduced with C1. Tolerated poorly with body aches, n/v/d. She was switched to [facility] but also tolerated poorly, DC'd after 2 cycles. -She began exemestane 12/10/20, tolerating well with hot and cold flashes -S/p left mastectomy on 12/30/20 under Dr. [name]. Pathology showed: focal residual invasive ductal carcinoma s/p neoadjuvant treatment; residual DCIS; margins of resection not involved; all 5 lymph nodes negative (0/5).  -she completed adjuvant RT per Dr. Lisbeth Renshaw 04/07/21 -herceptin/perjeta and exemestane on hold since 04/2021 for weakness, neuro and syncope work up -resumed exemestane and herceptin only 06/22/21.  She had multiple treatment delay due to  various reasons (hopital admission, no shows), last dose on 09/01/2021. -Patient stated that she carsick after last dose of trastuzumab, and does not want to restart.  We will stop it, she received a total of 11 doses of trastuzumab -We will continue exemestane.  2. Parkinson's Disease, diagnosed in Sep 2022 -she experienced fatigue, balance issue, weakness, syncope -cardiology work up showed euvolemia, normal EKG. Felt to be related to vasovagal secondary to dehydration/poor po intake -neurology work up by Dr. Mickeal Skinner revealed Parkindon's disease. She started requip and lyrica  -improved, no recent syncopal episode   3. CHF, DM with neuropathy, HTN, arthritis -Follow-up cardiology, PCP -Monitoring fluid status -most recent echo 07/08/21 was normal. -she was hospitalized on 08/03/21 for hypoglycemia. She reports her DM medications have been decreased. -she saw cardiology on 10/29/21  4. AKI  -She has intermittent mildly elevated creatinine, last creatinine was normal at 0.94 in 07/2021, creatinine 2.01 today, likely related to her diuretics -will inform her PCP and cardiology      PLAN: -will stop her trastuzumab infusion -We will inform Dr. [name] to remove her port -will inform her primary care physician and cardiologist regarding her elevated creatinine and adjust her diuretics -lab and f/u in 3 months -Continue exemestane, I refilled today.   No problem-specific Assessment & Plan notes found for this encounter.    SUMMARY OF ONCOLOGIC HISTORY: Oncology History Overview Note  Cancer Staging Malignant neoplasm of upper-outer quadrant of left breast in female, estrogen receptor positive ([place]) Staging form: Breast, AJCC 8th Edition - Clinical stage from 07/16/2020: Stage IB (cT2, cN1, cM0, G2, ER+, PR+, HER2+) - Signed by [name], MD on 07/21/2020    Malignant  neoplasm of upper-outer quadrant of left breast in female, estrogen receptor positive ([place])  07/01/2020 Mammogram    IMPRESSION: 1. There is a 12 cm span of highly suspicious calcifications involving the upper outer and upper inner quadrants of the left breast. On ultrasound, within this area of calcifications, there are confluent masses at 1 o'clock spanning at least 4.5 cm. An additional suspicious mass is seen in the left breast at 2 o'clock.  -Ultrasound targeted to the left breast at 1 o'clock, 5 cm from the nipple demonstrates a broad area of hypoechoic irregular tissue/confluent masses spanning at least 4.5 cm. There is a separate oval hypoechoic mass with indistinct margins at 2 o'clock, 7 cm from the nipple measuring 0.9 x 0.4 x 0.9 cm. Ultrasound of the left axilla demonstrates 2 abnormally thickened lymph nodes with cortices measuring 6-7 mm. There is an additional lymph node with a borderline cortex of 4 mm.   2. There are 2 lymph nodes with thickened cortices in the left axilla, and an additional lymph node with a borderline thickened cortex.   07/16/2020 Cancer Staging   Staging form: Breast, AJCC 8th Edition - Clinical stage from 07/16/2020: Stage IIA (cT3, cN1, cM0, G2, ER+, PR+, HER2+) - Signed by [name], MD on 08/05/2020    07/16/2020 Initial Biopsy   Diagnosis 1. 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 Breast, left, needle core biopsy, 1 o'clock - INVASIVE DUCTAL CARCINOMA - SEE COMMENT 3. Lymph node, biopsy - METASTATIC CARCINOMA INVOLVING A LYMPH NODE - SEE COMMENT Microscopic Comment 1. Based on the biopsy, the ductal carcinoma in situ has a comedo pattern, high nuclear grade and measures 0.7 cm in greatest linear extent. Prognostic markers (ER/PR) are pending and will be reported in an addendum. 2. Based on the biopsy, the carcinoma appears Nottingham grade 2 of 3 and measures 1.5 cm in greatest linear extent. Prognostic markers (ER/PR/ki-67/HER2) are  pending and will be reported in an addendum. Dr. Saralyn Pilar reviewed the case and agrees with the above diagnosis. These results were called to [facility] on July 17, 2020. 3. Based on the biopsy, the carcinoma appears measures 1.0 cm in greatest linear extent. Prognostic markers (ER/PR/ki-67/HER2) are pending and will be reported in an addendum.   07/16/2020 Receptors her2   1. PROGNOSTIC INDICATORS Results: IMMUNOHISTOCHEMICAL AND MORPHOMETRIC ANALYSIS PERFORMED MANUALLY Estrogen Receptor: 30%, POSITIVE, STRONG STAINING INTENSITY Progesterone Receptor: 5%, POSITIVE, STRONG STAINING INTENSITY  2. PROGNOSTIC INDICATORS Results: IMMUNOHISTOCHEMICAL AND MORPHOMETRIC ANALYSIS PERFORMED MANUALLY The tumor cells are POSITIVE for Her2 (3+). Estrogen Receptor: 85%, POSITIVE, STRONG STAINING INTENSITY Progesterone Receptor: 60%, POSITIVE, STRONG STAINING INTENSITY Proliferation Marker Ki67: 25%  3. PROGNOSTIC INDICATORS Results: IMMUNOHISTOCHEMICAL AND MORPHOMETRIC ANALYSIS PERFORMED MANUALLY The tumor cells are POSITIVE for Her2 (3+). Estrogen Receptor: 95%, POSITIVE, STRONG STAINING INTENSITY Progesterone Receptor: 45%, POSITIVE, STRONG STAINING INTENSITY Proliferation Marker Ki67: 30%   07/20/2020 Initial Diagnosis   Malignant neoplasm of upper-outer quadrant of left breast in female, estrogen receptor positive ([place])   07/31/2020 Echocardiogram   Baseline Echo  IMPRESSIONS     1. Left ventricular ejection fraction, by estimation, is 65 to 70%. The  left ventricle has normal function. The left ventricle has no regional  wall motion abnormalities. Left ventricular diastolic parameters are  indeterminate. The average left  ventricular global longitudinal strain is -17.1 %.   2. Right ventricular systolic function is normal. The  right ventricular  size is normal. There is normal pulmonary artery systolic pressure.   3. The mitral valve is normal in structure.  Trivial mitral valve  regurgitation. No evidence of mitral stenosis.   4. The aortic valve is normal in structure. Aortic valve regurgitation is  not visualized. No aortic stenosis is present.    07/31/2020 Breast MRI   IMPRESSION: 1. Extensive non mass enhancement within the left breast extending from the nipple to the chest wall. Overall findings are most compatible with extensive malignancy involving the majority of the left breast. 2. Indeterminate enhancing mass within the upper inner right breast. 3. Three morphologically abnormal lymph nodes within the left axilla. One of these nodes has been biopsied compatible with metastatic adenopathy.     08/03/2020 Imaging   Bone Scan whole body  IMPRESSION: Increased radiotracer uptake in the mid to lower lumbar regions of uncertain etiology. Correlation with radiography to assess for potential arthropathy in these areas advised. Increased uptake in major joints is likely of arthropathic etiology. Increased uptake in the mid face is likely due to paranasal sinus disease. Distribution of radiotracer uptake in bony structures elsewhere unremarkable.   08/04/2020 Procedure   PAC placement by Dr [name]   08/05/2020 - 10/28/2020 Neo-Adjuvant Chemotherapy   Neoadjuvant THP q3weeks for 4-6 cycles starting 08/05/20, followed by Herceptin or Kadcyla q3weeks to complete 1 year of treatment. Stopped THP after 1 dose due to poor toleration       -I switched her to [place] starting 08/25/20. Stopped after C2 due to poor toleration (10/28/20)   08/07/2020 Imaging   CT CAP  IMPRESSION: 2.7 cm mass in the upper outer left breast, likely corresponding to the patient's known primary breast neoplasm.   8 mm short axis left axillary node, suspicious for nodal metastasis in this clinical context.   No findings suspicious for distant metastasis.   Endometrium is mildly prominent, measuring 11 mm. Correlate for vaginal bleeding and consider  pelvic ultrasound for further evaluation if clinically warranted.   08/11/2020 Pathology Results   Diagnosis Breast, right, needle core biopsy, right breast - PAPILLARY LESION - SEE COMMENT Microscopic Comment The biopsy consists of a papillary lesion with a focal area of adenosis. Immunohistochemistry (SMM, calponin and p63) shows retention of the myoepithelial cell layer in the focus of adenosis. 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 These results were called to [facility] on August 12, 2020.   10/27/2020 Imaging   CT Angio chest  IMPRESSION: Slightly suboptimal opacification of the main pulmonary artery, however no central or proximal segmental pulmonary embolism   Minimal bibasilar atelectasis   Aortic Atherosclerosis (ICD10-I70.0).   10/31/2020 Imaging   CT AP  IMPRESSION: 1. No acute finding by CT other than a large amount of stool and gas in the right colon and gas in the transverse colon. No small bowel obstruction. 2. Previous cholecystectomy. 3. Aortic atherosclerosis. 4. Umbilical hernia containing only fat. 5. Small amount of fluid in the endometrial canal as seen previously.   Aortic Atherosclerosis (ICD10-I70.0).   12/04/2020 Imaging   MRI Breast IMPRESSION: 1. Evidence of a positive response to interval treatment. There has been a significant improvement of both the extent and strength of the enhancement throughout the outer LEFT breast when compared to the earlier MRI of 07/31/2020. 2. However, there remains scattered areas of mass and non-mass enhancement throughout the outer LEFT breast, involving the upper outer quadrant and lower outer quadrant,  as detailed above. 3. The 3 morphologically abnormal lymph nodes in the LEFT axilla identified on previous MRI, 1 a biopsy-proven metastasis, are all slightly smaller indicating an additional positive response to interval treatment. 4. No evidence of malignancy  within the RIGHT breast.   12/10/2020 -  Chemotherapy   Patient is on Treatment Plan : BREAST Trastuzumab + Perjeta (05/06/21) q21d     12/30/2020 Surgery   A. BREAST, LEFT, MASTECTOMY:  - Focal residual invasive ductal carcinoma status post neoadjuvant  treatment.  - Residual ductal carcinoma in situ.  - Margins of resection are not involved.  - Biopsy clip (X2).   B. LYMPH NODES, LEFT #1, DISSECTION:  - Three lymph nodes, negative for carcinoma (0/3).  - Biopsy clip (X1).   C. LYMPH NODES, LEFT #2, DISSECTION:  - Two lymph nodes, negative for carcinoma (0/2).   PROGNOSTIC INDICATOR RESULTS:  - The tumor cells are POSITIVE for Her2 (3+).  - Estrogen Receptor:       POSITIVE, 90%, STRONG STAINING  - Progesterone Receptor:   POSITIVE, 50%, MODERATE STAINING    12/30/2020 Cancer Staging   Staging form: Breast, AJCC 8th Edition - Pathologic stage from 12/30/2020: No Stage Recommended (ypT1a, pN0, cM0, G2, ER+, PR+, HER2+) - Signed by [name], MD on 01/20/2021 Stage prefix: Post-therapy Histologic grading system: 3 grade system Residual tumor (R): R0 - None    02/17/2021 - 04/08/2021 Radiation Therapy    Site Technique Total Dose (Gy) Dose per Fx (Gy) Completed Fx Beam Energies  Chest Wall, Left: CW_Lt 3D 50.4/50.4 1.8 28/28 10X  Chest Wall, Left: CW_Lt_SCLV 3D 50.4/50.4 1.8 28/28 6X, 15X  Chest Wall, Left: CW_Lt_Bst Electron 10/10 2 5/5 9E       INTERVAL HISTORY:  Cheryl Blankenship is here for a follow up of breast cancer. She was last seen by me on 09/01/2021. She presents to the clinic alone, and we connected with her son on the phone.  She did not tolerated last Herceptin infusion, she had severe fatigue, intermittent nauseas and vomiting, it lasted a few weeks, she finally recovered but does not want more treatment.   She is feeling well now, she has some intermittent pain in right hip and thigh, she walks with a walker, she also complains back pain and stiffness, especially  when she stands, this has been chronic and overall stable.    All other systems were reviewed with the patient and are negative.  MEDICAL HISTORY:  Past Medical History:  Diagnosis Date   Anxiety    Breast cancer ([place])    Cancer ([place])    CHF (congestive heart failure) ([facility])    Congestive heart failure (CHF) ([facility])    Depression    Diabetes mellitus without complication ([place])    type 2   Diabetes mellitus without complication ([facility])    GERD (gastroesophageal reflux disease)    High cholesterol    History of kidney stones    Hypertension    PONV (postoperative nausea and vomiting)    Stroke ([place])     SURGICAL HISTORY: Past Surgical History:  Procedure Laterality Date   APPENDECTOMY     BREAST SURGERY     CHOLECYSTECTOMY     COLONOSCOPY     ESOPHAGOGASTRODUODENOSCOPY (EGD) WITH PROPOFOL N/A 11/01/2020   Procedure: ESOPHAGOGASTRODUODENOSCOPY (EGD) WITH PROPOFOL;  Surgeon: Arta Silence, MD;  Location: [facility];  Service: Endoscopy;  Laterality: N/A;   EYE SURGERY     cataracts   MASTECTOMY  MASTECTOMY WITH AXILLARY LYMPH NODE DISSECTION Left 12/30/2020   Procedure: LEFT MASTECTOMY WITH TARGETED AXILLARY LYMPH NODE DISSECTION;  Surgeon: [name], MD;  Location: [facility];  Service: General;  Laterality: Left;   MULTIPLE TOOTH EXTRACTIONS     PORTACATH PLACEMENT N/A 08/04/2020   Procedure: INSERTION PORT-A-CATH WITH ULTRASOUND GUIDANCE;  Surgeon: [name], MD;  Location: [facility];  Service: General;  Laterality: N/A;   STOMACH SURGERY      I have reviewed the social history and family history with the patient and they are unchanged from previous note.  ALLERGIES:  is allergic to coconut (cocos nucifera) allergy skin test, hydrochlorothiazide, lisinopril, coconut (cocos nucifera) allergy skin test, coconut oil, hydrochlorothiazide, other, and lisinopril.  MEDICATIONS:  Current Outpatient Medications  Medication Sig Dispense Refill   Accu-Chek FastClix Lancets  MISC 1 each by Other route in the morning, at noon, and at bedtime.     acetaminophen (TYLENOL) 500 MG tablet Take 1 tablet (500 mg total) by mouth every 6 (six) hours as needed for moderate pain, headache or mild pain. 20 tablet 0   albuterol (VENTOLIN HFA) 108 (90 Base) MCG/ACT inhaler Inhale 1-2 puffs into the lungs every 4 (four) hours as needed for wheezing or shortness of breath. 8 g 0   ALPRAZolam (XANAX) 0.25 MG tablet Take 1-2 tablets (0.25-0.5 mg total) by mouth 3 (three) times daily as needed for anxiety or sleep. 30 tablet 0   amLODipine (NORVASC) 10 MG tablet Take 10 mg by mouth daily.     aspirin 81 MG chewable tablet Chew 81 mg by mouth daily.      bisacodyl (DULCOLAX) 5 MG EC tablet Take 1 tablet (5 mg total) by mouth daily as needed for moderate constipation. 30 tablet 0   carvedilol (COREG) 25 MG tablet Take 25 mg by mouth 2 (two) times daily with a meal.     cholecalciferol (VITAMIN D) 25 MCG (1000 UNIT) tablet Take 1,000 Units by mouth at bedtime.     cycloSPORINE (RESTASIS) 0.05 % ophthalmic emulsion Place 1 drop into both eyes 2 (two) times daily.     diphenoxylate-atropine (LOMOTIL) 2.5-0.025 MG tablet Take 1-2 tablets by mouth 4 (four) times daily as needed for diarrhea or loose stools.     DULoxetine (CYMBALTA) 30 MG capsule Take 1 tablet by mouth daily. 30 mg daily     DULoxetine (CYMBALTA) 30 MG capsule Take 30 mg by mouth daily.     exemestane (AROMASIN) 25 MG tablet Take 1 tablet (25 mg total) by mouth daily after breakfast. 90 tablet 1   folic acid (FOLVITE) 1 MG tablet Take 1 mg by mouth daily.     furosemide (LASIX) 20 MG tablet Take 1 tablet (20 mg total) by mouth daily as needed (for swelling or weight gain). 90 tablet 3   gabapentin (NEURONTIN) 400 MG capsule Take 400 mg by mouth daily.     Insulin Aspart (NOVOLOG FLEXPEN [place]) Inject 0-10 Units into the skin with breakfast, with lunch, and with evening meal. 0-150 = 0 units 150-199 = 2 units 200-249 = 4  units 250-299 = 6 units 300-349 = 8 units Greater than 350 = 10 units     insulin glargine (LANTUS) 100 UNIT/ML injection Inject 0.3 mLs (30 Units total) into the skin 2 (two) times daily. 10 mL 0   lactulose (CHRONULAC) 10 GM/15ML solution Take 30 g by mouth daily.     lidocaine-prilocaine (EMLA) cream Apply 1 application topically as needed (pain). (  Patient taking differently: Apply 1 application topically daily.) 30 g 3   losartan (COZAAR) 100 MG tablet Take 1 tablet (100 mg total) by mouth daily. 90 tablet 3   metFORMIN (GLUCOPHAGE) 500 MG tablet Take 500 mg by mouth 2 (two) times daily with a meal.      methocarbamol (ROBAXIN) 500 MG tablet Take 500 mg by mouth every 8 (eight) hours as needed for muscle spasms.     Omega-3 Fatty Acids (FISH OIL) 1000 MG CAPS Take 1,000 mg by mouth daily.     omeprazole (PRILOSEC) 20 MG capsule Take 1 capsule (20 mg total) by mouth daily. 30 capsule 11   ondansetron (ZOFRAN-ODT) 4 MG disintegrating tablet Take 4 mg by mouth 2 (two) times daily as needed for nausea or vomiting.     potassium chloride (MICRO-K) 10 MEQ CR capsule Take 10 mEq by mouth daily.     pregabalin (LYRICA) 75 MG capsule Take 1 capsule (75 mg total) by mouth 2 (two) times daily. 60 capsule 3   prochlorperazine (COMPAZINE) 10 MG tablet Take 1 tablet (10 mg total) by mouth every 6 (six) hours as needed for nausea or vomiting. 30 tablet 5   PROMETHEGAN 25 MG suppository place 1 SUPPOSITORY rectally EVERY 8 HOURS AS NEEDED FOR NAUSEA AND VOMITING 30 suppository 1   RESTASIS 0.05 % ophthalmic emulsion Place 1 drop into both eyes 2 (two) times daily as needed (dry eyes).     rOPINIRole (REQUIP) 0.25 MG tablet TAKE 1 TABLET 3 TIMES DAILY x7 DAYS, THEN TAKE 2 TABLETS 3 TIMES DAILY x7 DAYS, THEN TAKE 3 TABLETS 3 TIMES DAILY x7 DAYS, THEN TAKE 4 TABLETS 3 TIMES DAILY 90 tablet 1   rosuvastatin (CRESTOR) 5 MG tablet Take 5 mg by mouth at bedtime.     spironolactone (ALDACTONE) 25 MG tablet Take 1  tablet (25 mg total) by mouth daily. 90 tablet 3   sucralfate (CARAFATE) 1 GM/10ML suspension Take 10 mLs (1 g total) by mouth 4 (four) times daily -  with meals and at bedtime. 420 mL 0   torsemide (DEMADEX) 20 MG tablet Take 40 mg by mouth 2 (two) times daily.     vitamin B-12 (CYANOCOBALAMIN) 1000 MCG tablet Take 1,000 mcg by mouth daily.     zolpidem (AMBIEN) 5 MG tablet Take 1 tablet (5 mg total) by mouth at bedtime as needed for sleep. 20 tablet 0   No current facility-administered medications for this visit.    PHYSICAL EXAMINATION: ECOG PERFORMANCE STATUS: 3 - Symptomatic, >50% confined to bed  Vitals:   10/26/21 0907  BP: 132/63  Pulse: 69  Resp: 17  Temp: 98.4 F (36.9 C)  SpO2: 100%   Wt Readings from Last 3 Encounters:  10/26/21 211 lb 9.6 oz (96 kg)  10/26/21 210 lb (95.3 kg)  09/01/21 213 lb (96.6 kg)     GENERAL:alert, no distress and comfortable SKIN: skin color, texture, turgor are normal, no rashes or significant lesions EYES: normal, Conjunctiva are pink and non-injected, sclera clear NEURO: alert & oriented x 3 with fluent speech, no focal motor/sensory deficits BREAST: some soft tissue swelling to the left mastectomy site. No palpable mass, nodules or adenopathy bilaterally. Breast exam benign.   LABORATORY DATA:  I have reviewed the data as listed CBC Latest Ref Rng & Units 10/26/2021 08/06/2021 08/04/2021  WBC 4.0 - 10.5 K/uL 5.5 5.0 7.0  Hemoglobin 12.0 - 15.0 g/dL 10.9(L) 10.0(L) 11.7(L)  Hematocrit 36.0 - 46.0 % 34.1(L)  31.7(L) 37.9  Platelets 150 - 400 K/uL 235 230 250     CMP Latest Ref Rng & Units 10/26/2021 08/08/2021 08/07/2021  Glucose 70 - 99 mg/dL 297(H) 224(H) 315(H)  BUN 8 - 23 mg/dL 47(H) 25(H) 26(H)  Creatinine 0.44 - 1.00 mg/dL 2.01(H) 0.94 1.20(H)  Sodium 135 - 145 mmol/L 137 137 135  Potassium 3.5 - 5.1 mmol/L 4.5 4.7 5.0  Chloride 98 - 111 mmol/L 102 104 102  CO2 22 - 32 mmol/L 28 26 27   Calcium 8.9 - 10.3 mg/dL 10.3 10.1 10.2   Total Protein 6.5 - 8.1 g/dL 7.0 - -  Total Bilirubin 0.3 - 1.2 mg/dL 0.5 - -  Alkaline Phos 38 - 126 U/L 63 - -  AST 15 - 41 U/L 12(L) - -  ALT 0 - 44 U/L 10 - -      RADIOGRAPHIC STUDIES: I have personally reviewed the radiological images as listed and agreed with the findings in the report. No results found.    No orders of the defined types were placed in this encounter.   All questions were answered. The patient knows to call the clinic with any problems, questions or concerns. No barriers to learning was detected. The total time spent in the appointment was 30 minutes.     [name], MD 10/26/2021   I, Wilburn Mylar, am acting as scribe for [name], MD.   I have reviewed the above documentation for accuracy and completeness, and I agree with the above.

## 2021-10-26 NOTE — Progress Notes (Signed)
This nurse faxed office notes and labs to PCP and Cardiologist per MD request.  No further concerns at this time.

## 2021-10-27 ENCOUNTER — Telehealth: Payer: Self-pay | Admitting: Hematology

## 2021-10-27 NOTE — Telephone Encounter (Signed)
Scheduled follow-up appointment per 2/7 los. Patient is aware.

## 2021-10-28 ENCOUNTER — Encounter: Payer: Self-pay | Admitting: *Deleted

## 2021-10-28 ENCOUNTER — Telehealth: Payer: Self-pay

## 2021-10-28 DIAGNOSIS — C50412 Malignant neoplasm of upper-outer quadrant of left female breast: Secondary | ICD-10-CM

## 2021-10-28 NOTE — Telephone Encounter (Signed)
Office note faxed to PCP & Cardiologist per note from [facility], LPN.

## 2021-11-07 NOTE — Progress Notes (Unsigned)
Cardiology Office Note:   Date:  11/07/2021  NAME:  Cheryl Blankenship    MRN: [id number] DOB:  14-Apr-1951   PCP:  Cipriano Mile, NP  Cardiologist:  None  Electrophysiologist:  None   Referring MD: Cipriano Mile, NP   No chief complaint on file. ***  History of Present Illness:   Cheryl Blankenship is a 71 y.o. female with a hx of diabetes, HFpEF, hypertension, breast cancer presents for follow-up.  Problem List 1. DM -A1c 8.4 -Normal stress test, normal echo 08/2019 [facility]) 2. HTN 3. HLD -T chol 134, HDL 38, LDL 63, triglycerides 165 4. Breast CA Stage II, ER+, PR+, HER2+ -currently on neo-adjuvant antiestrogen oral therapy (on trastuzumab) -Mastectomy 12/30/2020 -chest seroma from prior surgery 5. HFpEF -EF 65-70%  Past Medical History: Past Medical History:  Diagnosis Date   Anxiety    Breast cancer ([place])    Cancer ([place])    CHF (congestive heart failure) (H[facility])    Congestive heart failure (CHF) (HCC)    Depression    Diabetes mellitus without complication ([place])    type 2   Diabetes mellitus without complication (HCC)    GERD (gastroesophageal reflux disease)    High cholesterol    History of kidney stones    Hypertension    PONV (postoperative nausea and vomiting)    Stroke [facility])     Past Surgical History: Past Surgical History:  Procedure Laterality Date   APPENDECTOMY     BREAST SURGERY     CHOLECYSTECTOMY     COLONOSCOPY     ESOPHAGOGASTRODUODENOSCOPY (EGD) WITH PROPOFOL N/A 11/01/2020   Procedure: ESOPHAGOGASTRODUODENOSCOPY (EGD) WITH PROPOFOL;  Surgeon: Arta Silence, MD;  Location: [facility];  Service: Endoscopy;  Laterality: N/A;   EYE SURGERY     cataracts   MASTECTOMY     MASTECTOMY WITH AXILLARY LYMPH NODE DISSECTION Left 12/30/2020   Procedure: LEFT MASTECTOMY WITH TARGETED AXILLARY LYMPH NODE DISSECTION;  Surgeon: [name], MD;  Location: [place];  Service: General;  Laterality: Left;   MULTIPLE TOOTH EXTRACTIONS     PORTACATH  PLACEMENT N/A 08/04/2020   Procedure: INSERTION PORT-A-CATH WITH ULTRASOUND GUIDANCE;  Surgeon: [name], MD;  Location: [place];  Service: General;  Laterality: N/A;   STOMACH SURGERY      Current Medications: No outpatient medications have been marked as taking for the 11/10/21 encounter (Appointment) with Cheryl Rile, MD.     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  Tobacco Use   Smoking status: Never   Smokeless tobacco: Never  Vaping Use   Vaping Use: Never used  Substance and Sexual Activity   Alcohol use: Never    Comment: 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  ROS:   All other ROS reviewed and negative. Pertinent positives noted in the HPI.     EKGs/Labs/Other Studies Reviewed:   The following studies were personally reviewed by me today:  EKG:  EKG is *** ordered today.  The ekg ordered today demonstrates ***, and was personally reviewed  by me.   TTE 07/08/2021  1. Left ventricular ejection fraction, by estimation, is 60 to 65%. The  left ventricle has normal function. The left ventricle has no regional  wall motion abnormalities. Left ventricular diastolic parameters are  consistent with Grade I diastolic  dysfunction (impaired relaxation). The average left ventricular global  longitudinal strain is -22.7 %. The global longitudinal strain is normal.   2. Right ventricular systolic function is normal. The right ventricular  size is normal. There is normal pulmonary artery systolic pressure. The  estimated right ventricular systolic pressure is 80.1 mmHg.   3. The mitral valve is grossly normal. Trivial mitral valve  regurgitation.   4. The aortic valve is tricuspid. Aortic valve regurgitation is not  visualized. Mild to moderate aortic valve sclerosis/calcification is  present, without any evidence of aortic stenosis.   5. The inferior vena cava is normal in size with greater than 50%  respiratory variability, suggesting right atrial pressure of 3 mmHg.   Recent Labs: 05/22/2021: B Natriuretic Peptide 14.2 10/26/2021: ALT 10; BUN 47; Creatinine 2.01; Hemoglobin 10.9; Platelet Count 235; Potassium 4.5; Sodium 137   Recent Lipid Panel    Component Value Date/Time   CHOL 134 10/30/2020 1513   TRIG 165 (H) 10/30/2020 1513   HDL 38 (L) 10/30/2020 1513   CHOLHDL 3.5 10/30/2020 1513   VLDL 33 10/30/2020 1513   LDLCALC 63 10/30/2020 1513    Physical Exam:   VS:  There were no vitals taken for this visit.   Wt Readings from Last 3 Encounters:  10/26/21 211 lb 9.6 oz (96 kg)  10/26/21 210 lb (95.3 kg)  09/01/21 213 lb (96.6 kg)    General: Well nourished, well developed, in no acute distress Head: Atraumatic, normal size  Eyes: PEERLA, EOMI  Neck: Supple, no JVD Endocrine: No thryomegaly Cardiac: Normal S1, S2; RRR; no murmurs, rubs, or gallops Lungs: Clear to auscultation bilaterally, no wheezing, rhonchi or rales   Abd: Soft, nontender, no hepatomegaly  Ext: No edema, pulses 2+ Musculoskeletal: No deformities, BUE and BLE strength normal and equal Skin: Warm and dry, no rashes   Neuro: Alert and oriented to person, place, time, and situation, CNII-XII grossly intact, no focal deficits  Psych: Normal mood and affect   ASSESSMENT:   Litzi Binning is a 71 y.o. female who presents for the following: No diagnosis found.  PLAN:   There are no diagnoses linked to this encounter.  {Are you ordering a CV Procedure (e.g. stress test, cath, DCCV, TEE, etc)?   Press F2        :[id number]}  Disposition: No follow-ups on file.  Medication Adjustments/Labs and Tests Ordered: Current medicines are reviewed at length with the patient today.  Concerns regarding medicines are outlined above.  No orders of the defined types were placed in  this encounter.  No orders of the defined types were placed in this encounter.   There are no Patient Instructions on file for this visit.   Time Spent with Patient: I have spent a total of *** minutes with patient reviewing hospital notes, telemetry, EKGs, labs and examining the patient as well as establishing an assessment and plan that was discussed with the patient.  > 50% of time was spent in direct patient care.  Signed, Addison Naegeli. Audie Box, MD, [facility]  [address] [place], [place] [address] [phone]  11/07/2021 4:10 PM

## 2021-11-08 ENCOUNTER — Other Ambulatory Visit: Payer: Self-pay | Admitting: Internal Medicine

## 2021-11-08 ENCOUNTER — Other Ambulatory Visit: Payer: Self-pay | Admitting: Cardiovascular Disease

## 2021-11-10 ENCOUNTER — Ambulatory Visit: Payer: Medicare HMO | Admitting: Cardiovascular Disease

## 2021-11-10 DIAGNOSIS — E782 Mixed hyperlipidemia: Secondary | ICD-10-CM

## 2021-11-10 DIAGNOSIS — I5032 Chronic diastolic (congestive) heart failure: Secondary | ICD-10-CM

## 2021-11-10 DIAGNOSIS — Z09 Encounter for follow-up examination after completed treatment for conditions other than malignant neoplasm: Secondary | ICD-10-CM

## 2021-11-10 DIAGNOSIS — I10 Essential (primary) hypertension: Secondary | ICD-10-CM

## 2021-12-14 ENCOUNTER — Encounter: Payer: Self-pay | Admitting: Hematology

## 2021-12-15 ENCOUNTER — Telehealth: Payer: Self-pay | Admitting: Hematology

## 2021-12-15 ENCOUNTER — Telehealth: Payer: Self-pay | Admitting: Adult Health

## 2021-12-15 NOTE — Telephone Encounter (Signed)
LMOM that we will change her upcoming est f/u to [facility] appointment.  Call back # given.  ? ?Wilber Bihari, NP 12/15/21 8:48 AM ?Medical Oncology and Hematology ?[facility] ?[address] ?[place], [place] [address] ?Tel. [phone]    Fax. [phone] ? ?

## 2021-12-15 NOTE — Telephone Encounter (Signed)
.  Called pt per 3/29 inbakset , Patient was unavailable, a message with appt time and date was left with number on file.   ?

## 2021-12-17 NOTE — Progress Notes (Shared)
?[facility] Note ? ?12/20/2021 ? ?  ? ?CHIEF COMPLAINT ?Patient presents for No chief complaint on file. ? ? ? ?HISTORY OF PRESENT ILLNESS: ?Cheryl Blankenship is a 71 y.o. female who presents to the clinic today for:  ? ? ?pt states she is delayed to follow up from October 2021 due to developing breast cancer and undergoing chemo and radiation, pt states last Saturday she noticed that her right eye vision had decreased, she states she felt like she had something in her eye, but it kept coming back ? ?Referring physician: ?Shirleen Schirmer, PA-C  ?Cipriano Mile, NP ?[address] ?[place],  [place] [address] ? ?HISTORICAL INFORMATION:  ? ?Selected notes from the [facility] ?Referred by Shirleen Schirmer, PA-C ?  ? ?CURRENT MEDICATIONS: ?Current Outpatient Medications (Ophthalmic Drugs)  ?Medication Sig  ? cycloSPORINE (RESTASIS) 0.05 % ophthalmic emulsion Place 1 drop into both eyes 2 (two) times daily.  ? RESTASIS 0.05 % ophthalmic emulsion Place 1 drop into both eyes 2 (two) times daily as needed (dry eyes).  ? ?No current facility-administered medications for this visit. (Ophthalmic Drugs)  ? ?Current Outpatient Medications (Other)  ?Medication Sig  ? Accu-Chek FastClix Lancets MISC 1 each by Other route in the morning, at noon, and at bedtime.  ? acetaminophen (TYLENOL) 500 MG tablet Take 1 tablet (500 mg total) by mouth every 6 (six) hours as needed for moderate pain, headache or mild pain.  ? albuterol (VENTOLIN HFA) 108 (90 Base) MCG/ACT inhaler Inhale 1-2 puffs into the lungs every 4 (four) hours as needed for wheezing or shortness of breath.  ? ALPRAZolam (XANAX) 0.25 MG tablet Take 1-2 tablets (0.25-0.5 mg total) by mouth 3 (three) times daily as needed for anxiety or sleep.  ? amLODipine (NORVASC) 10 MG tablet Take 10 mg by mouth daily.  ? aspirin 81 MG chewable tablet Chew 81 mg by mouth daily.   ? bisacodyl (DULCOLAX) 5 MG EC tablet Take 1 tablet (5 mg total) by mouth  daily as needed for moderate constipation.  ? carvedilol (COREG) 25 MG tablet Take 25 mg by mouth 2 (two) times daily with a meal.  ? cholecalciferol (VITAMIN D) 25 MCG (1000 UNIT) tablet Take 1,000 Units by mouth at bedtime.  ? diphenoxylate-atropine (LOMOTIL) 2.5-0.025 MG tablet Take 1-2 tablets by mouth 4 (four) times daily as needed for diarrhea or loose stools.  ? DULoxetine (CYMBALTA) 30 MG capsule Take 1 tablet by mouth daily. 30 mg daily  ? DULoxetine (CYMBALTA) 30 MG capsule Take 30 mg by mouth daily.  ? exemestane (AROMASIN) 25 MG tablet Take 1 tablet (25 mg total) by mouth daily after breakfast.  ? folic acid (FOLVITE) 1 MG tablet Take 1 mg by mouth daily.  ? furosemide (LASIX) 20 MG tablet Take 1 tablet (20 mg total) by mouth daily as needed (for swelling or weight gain).  ? gabapentin (NEURONTIN) 400 MG capsule Take 400 mg by mouth daily.  ? Insulin Aspart (NOVOLOG FLEXPEN [place]) Inject 0-10 Units into the skin with breakfast, with lunch, and with evening meal. 0-150 = 0 units ?150-199 = 2 units ?200-249 = 4 units ?250-299 = 6 units ?300-349 = 8 units ?Greater than 350 = 10 units  ? insulin glargine (LANTUS) 100 UNIT/ML injection Inject 0.3 mLs (30 Units total) into the skin 2 (two) times daily.  ? lactulose (CHRONULAC) 10 GM/15ML solution Take 30 g by mouth daily.  ? lidocaine-prilocaine (EMLA) cream Apply 1  application topically as needed (pain). (Patient taking differently: Apply 1 application topically daily.)  ? losartan (COZAAR) 100 MG tablet Take 1 tablet (100 mg total) by mouth daily.  ? metFORMIN (GLUCOPHAGE) 500 MG tablet Take 500 mg by mouth 2 (two) times daily with a meal.   ? methocarbamol (ROBAXIN) 500 MG tablet Take 500 mg by mouth every 8 (eight) hours as needed for muscle spasms.  ? Omega-3 Fatty Acids (FISH OIL) 1000 MG CAPS Take 1,000 mg by mouth daily.  ? omeprazole (PRILOSEC) 20 MG capsule Take 1 capsule (20 mg total) by mouth daily.  ? ondansetron (ZOFRAN-ODT) 4 MG disintegrating  tablet Take 4 mg by mouth 2 (two) times daily as needed for nausea or vomiting.  ? potassium chloride (MICRO-K) 10 MEQ CR capsule Take 10 mEq by mouth daily.  ? pregabalin (LYRICA) 75 MG capsule TAKE 1 TABLET BY MOUTH 2 TIMES DAILY  ? prochlorperazine (COMPAZINE) 10 MG tablet Take 1 tablet (10 mg total) by mouth every 6 (six) hours as needed for nausea or vomiting.  ? PROMETHEGAN 25 MG suppository place 1 SUPPOSITORY rectally EVERY 8 HOURS AS NEEDED FOR NAUSEA AND VOMITING  ? rOPINIRole (REQUIP) 0.25 MG tablet TAKE 1 TABLET 3 TIMES DAILY x7 DAYS, THEN TAKE 2 TABLETS 3 TIMES DAILY x7 DAYS, THEN TAKE 3 TABLETS 3 TIMES DAILY x7 DAYS, THEN TAKE 4 TABLETS 3 TIMES DAILY  ? rosuvastatin (CRESTOR) 5 MG tablet Take 5 mg by mouth at bedtime.  ? spironolactone (ALDACTONE) 25 MG tablet Take 1 tablet (25 mg total) by mouth daily.  ? sucralfate (CARAFATE) 1 GM/10ML suspension Take 10 mLs (1 g total) by mouth 4 (four) times daily -  with meals and at bedtime.  ? torsemide (DEMADEX) 20 MG tablet TAKE 2 TABLETS BY MOUTH EVERY DAY  ? vitamin B-12 (CYANOCOBALAMIN) 1000 MCG tablet Take 1,000 mcg by mouth daily.  ? zolpidem (AMBIEN) 5 MG tablet Take 1 tablet (5 mg total) by mouth at bedtime as needed for sleep.  ? ?No current facility-administered medications for this visit. (Other)  ? ?REVIEW OF SYSTEMS: ? ? ?ALLERGIES ?Allergies  ?Allergen Reactions  ? Coconut (Cocos Nucifera) Allergy Skin Test   ?  Hives and swells mouth  ? Hydrochlorothiazide Swelling  ?  tongue  ? Lisinopril Swelling  ?  Angioedema. Tolerates Losartan.  ? Coconut (Cocos Nucifera) Allergy Skin Test   ? Coconut Oil Hives  ? Hydrochlorothiazide Swelling  ? Other   ?  Pt is allergic to a pain medication. Unsure of the name  ? Lisinopril Rash  ? ? ?PAST MEDICAL HISTORY ?Past Medical History:  ?Diagnosis Date  ? Anxiety   ? Breast cancer ([place])   ? Cancer [facility])   ? CHF (congestive heart failure) ([place])   ? Congestive heart failure (CHF) ([place])   ? Depression   ? Diabetes  mellitus without complication ([place])   ? type 2  ? Diabetes mellitus without complication ([place])   ? GERD (gastroesophageal reflux disease)   ? High cholesterol   ? History of kidney stones   ? Hypertension   ? PONV (postoperative nausea and vomiting)   ? Stroke [facility])   ? ?Past Surgical History:  ?Procedure Laterality Date  ? APPENDECTOMY    ? BREAST SURGERY    ? CHOLECYSTECTOMY    ? COLONOSCOPY    ? ESOPHAGOGASTRODUODENOSCOPY (EGD) WITH PROPOFOL N/A 11/01/2020  ? Procedure: ESOPHAGOGASTRODUODENOSCOPY (EGD) WITH PROPOFOL;  Surgeon: Arta Silence, MD;  [facility];  Service: Endoscopy;  Laterality: N/A;  ? EYE SURGERY    ? cataracts  ? MASTECTOMY    ? MASTECTOMY WITH AXILLARY LYMPH NODE DISSECTION Left 12/30/2020  ? Procedure: LEFT MASTECTOMY WITH TARGETED AXILLARY LYMPH NODE DISSECTION;  Surgeon: [name], MD;  Location: [place];  Service: General;  Laterality: Left;  ? MULTIPLE TOOTH EXTRACTIONS    ? PORTACATH PLACEMENT N/A 08/04/2020  ? Procedure: INSERTION PORT-A-CATH WITH ULTRASOUND GUIDANCE;  Surgeon: [name], MD;  Location: [facility];  Service: General;  Laterality: N/A;  ? STOMACH SURGERY    ? ?FAMILY HISTORY ?Family History  ?Problem Relation Age of Onset  ? Heart attack Sister   ? Diabetes Sister   ? Heart attack Mother   ? Ovarian cancer Mother 27  ? Heart attack Brother   ? Cancer Niece   ?     bone cancer   ? Cancer Cousin   ?     unknown type cancer   ? Cancer Cousin   ?     metastatic cancer to bone   ? ?SOCIAL HISTORY ?Social History  ? ?Tobacco Use  ? Smoking status: Never  ? Smokeless tobacco: Never  ?Vaping Use  ? Vaping Use: Never used  ?Substance Use Topics  ? Alcohol use: Never  ?  Comment: 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  ? Drug use: Never  ?  ? ?  ?OPHTHALMIC EXAM: ?Not recorded ?  ? ?IMAGING AND PROCEDURES  ?Imaging and Procedures for 12/20/2021 ? ? ?  ?  ? ?  ?ASSESSMENT/PLAN: ?  ICD-10-CM   ?1. Proliferative diabetic retinopathy of both eyes with macular edema  associated with type 2 diabetes mellitus ([place])  F09.3235   ?  ?2. Essential hypertension  I10   ?  ?3. Hypertensive retinopathy of both eyes  H35.033   ?  ?4. Pseudophakia, both eyes  Z96.1   ?  ? ? ?1,2.

## 2021-12-20 ENCOUNTER — Encounter (INDEPENDENT_AMBULATORY_CARE_PROVIDER_SITE_OTHER): Payer: Medicare HMO | Admitting: Ophthalmology

## 2021-12-20 DIAGNOSIS — I10 Essential (primary) hypertension: Secondary | ICD-10-CM

## 2021-12-20 DIAGNOSIS — H35033 Hypertensive retinopathy, bilateral: Secondary | ICD-10-CM

## 2021-12-20 DIAGNOSIS — Z961 Presence of intraocular lens: Secondary | ICD-10-CM

## 2021-12-20 DIAGNOSIS — E113513 Type 2 diabetes mellitus with proliferative diabetic retinopathy with macular edema, bilateral: Secondary | ICD-10-CM

## 2022-01-10 ENCOUNTER — Encounter: Payer: Self-pay | Admitting: Hematology

## 2022-01-21 ENCOUNTER — Telehealth: Payer: Self-pay | Admitting: Adult Health

## 2022-01-21 NOTE — Telephone Encounter (Signed)
Rescheduled appointment per provider. Phone numbers in chart are not working or do not have voicemail setup. I will mail out calender.  ?

## 2022-01-28 ENCOUNTER — Encounter: Payer: Self-pay | Admitting: Adult Health

## 2022-01-28 ENCOUNTER — Other Ambulatory Visit: Payer: Self-pay

## 2022-01-28 ENCOUNTER — Inpatient Hospital Stay: Payer: Medicare HMO | Attending: Nurse Practitioner

## 2022-01-28 ENCOUNTER — Ambulatory Visit: Payer: Self-pay | Admitting: Surgery

## 2022-01-28 ENCOUNTER — Ambulatory Visit: Payer: Medicare HMO | Admitting: Hematology

## 2022-01-28 ENCOUNTER — Inpatient Hospital Stay (HOSPITAL_BASED_OUTPATIENT_CLINIC_OR_DEPARTMENT_OTHER): Payer: Medicare HMO | Admitting: Adult Health

## 2022-01-28 ENCOUNTER — Other Ambulatory Visit: Payer: Medicare HMO

## 2022-01-28 VITALS — BP 129/88 | HR 65 | Temp 97.2°F | Resp 15 | Wt 210.7 lb

## 2022-01-28 DIAGNOSIS — C50412 Malignant neoplasm of upper-outer quadrant of left female breast: Secondary | ICD-10-CM

## 2022-01-28 DIAGNOSIS — Z923 Personal history of irradiation: Secondary | ICD-10-CM | POA: Insufficient documentation

## 2022-01-28 DIAGNOSIS — Z9012 Acquired absence of left breast and nipple: Secondary | ICD-10-CM | POA: Diagnosis not present

## 2022-01-28 DIAGNOSIS — Z170 Estrogen receptor positive status [ER+]: Secondary | ICD-10-CM | POA: Diagnosis not present

## 2022-01-28 DIAGNOSIS — Z9221 Personal history of antineoplastic chemotherapy: Secondary | ICD-10-CM | POA: Insufficient documentation

## 2022-01-28 DIAGNOSIS — Z79899 Other long term (current) drug therapy: Secondary | ICD-10-CM | POA: Insufficient documentation

## 2022-01-28 DIAGNOSIS — E2839 Other primary ovarian failure: Secondary | ICD-10-CM

## 2022-01-28 NOTE — Progress Notes (Signed)
SURVIVORSHIP VIRTUAL VISIT:   BRIEF ONCOLOGIC HISTORY:  Oncology History Overview Note  Cancer Staging Malignant neoplasm of upper-outer quadrant of left breast in female, estrogen receptor positive ([place]) Staging form: Breast, AJCC 8th Edition - Clinical stage from 07/16/2020: Stage IB (cT2, cN1, cM0, G2, ER+, PR+, HER2+) - Signed by [name], MD on 07/21/2020    Malignant neoplasm of upper-outer quadrant of left breast in female, estrogen receptor positive ([place])  07/01/2020 Mammogram   IMPRESSION: 1. There is a 12 cm span of highly suspicious calcifications involving the upper outer and upper inner quadrants of the left breast. On ultrasound, within this area of calcifications, there are confluent masses at 1 o'clock spanning at least 4.5 cm. An additional suspicious mass is seen in the left breast at 2 o'clock.  -Ultrasound targeted to the left breast at 1 o'clock, 5 cm from the nipple demonstrates a broad area of hypoechoic irregular tissue/confluent masses spanning at least 4.5 cm. There is a separate oval hypoechoic mass with indistinct margins at 2 o'clock, 7 cm from the nipple measuring 0.9 x 0.4 x 0.9 cm. Ultrasound of the left axilla demonstrates 2 abnormally thickened lymph nodes with cortices measuring 6-7 mm. There is an additional lymph node with a borderline cortex of 4 mm.   2. There are 2 lymph nodes with thickened cortices in the left axilla, and an additional lymph node with a borderline thickened cortex.   07/16/2020 Cancer Staging   Staging form: Breast, AJCC 8th Edition - Clinical stage from 07/16/2020: Stage IIA (cT3, cN1, cM0, G2, ER+, PR+, HER2+) - Signed by [name], MD on 08/05/2020    07/16/2020 Initial Biopsy   Diagnosis 1. 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 Breast, left, needle core biopsy, 1 o'clock - INVASIVE DUCTAL CARCINOMA -  SEE COMMENT 3. Lymph node, biopsy - METASTATIC CARCINOMA INVOLVING A LYMPH NODE - SEE COMMENT Microscopic Comment 1. Based on the biopsy, the ductal carcinoma in situ has a comedo pattern, high nuclear grade and measures 0.7 cm in greatest linear extent. Prognostic markers (ER/PR) are pending and will be reported in an addendum. 2. Based on the biopsy, the carcinoma appears Nottingham grade 2 of 3 and measures 1.5 cm in greatest linear extent. Prognostic markers (ER/PR/ki-67/HER2) are pending and will be reported in an addendum. Dr. Saralyn Pilar reviewed the case and agrees with the above diagnosis. These results were called to [facility] on July 17, 2020. 3. Based on the biopsy, the carcinoma appears measures 1.0 cm in greatest linear extent. Prognostic markers (ER/PR/ki-67/HER2) are pending and will be reported in an addendum.   07/16/2020 Receptors her2   1. PROGNOSTIC INDICATORS Results: IMMUNOHISTOCHEMICAL AND MORPHOMETRIC ANALYSIS PERFORMED MANUALLY Estrogen Receptor: 30%, POSITIVE, STRONG STAINING INTENSITY Progesterone Receptor: 5%, POSITIVE, STRONG STAINING INTENSITY  2. PROGNOSTIC INDICATORS Results: IMMUNOHISTOCHEMICAL AND MORPHOMETRIC ANALYSIS PERFORMED MANUALLY The tumor cells are POSITIVE for Her2 (3+). Estrogen Receptor: 85%, POSITIVE, STRONG STAINING INTENSITY Progesterone Receptor: 60%, POSITIVE, STRONG STAINING INTENSITY Proliferation Marker Ki67: 25%  3. PROGNOSTIC INDICATORS Results: IMMUNOHISTOCHEMICAL AND MORPHOMETRIC ANALYSIS PERFORMED MANUALLY The tumor cells are POSITIVE for Her2 (3+). Estrogen Receptor: 95%, POSITIVE, STRONG STAINING INTENSITY Progesterone Receptor: 45%, POSITIVE, STRONG STAINING INTENSITY Proliferation Marker Ki67: 30%   07/20/2020 Initial Diagnosis   Malignant neoplasm of upper-outer quadrant of left breast in female, estrogen receptor positive ([place])    07/31/2020 Echocardiogram   Baseline Echo  IMPRESSIONS      1. Left ventricular ejection fraction, by estimation, is 65 to 70%. The  left ventricle has normal function. The left ventricle has no regional  wall motion abnormalities. Left ventricular diastolic parameters are  indeterminate. The average left  ventricular global longitudinal strain is -17.1 %.   2. Right ventricular systolic function is normal. The right ventricular  size is normal. There is normal pulmonary artery systolic pressure.   3. The mitral valve is normal in structure. Trivial mitral valve  regurgitation. No evidence of mitral stenosis.   4. The aortic valve is normal in structure. Aortic valve regurgitation is  not visualized. No aortic stenosis is present.    07/31/2020 Breast MRI   IMPRESSION: 1. Extensive non mass enhancement within the left breast extending from the nipple to the chest wall. Overall findings are most compatible with extensive malignancy involving the majority of the left breast. 2. Indeterminate enhancing mass within the upper inner right breast. 3. Three morphologically abnormal lymph nodes within the left axilla. One of these nodes has been biopsied compatible with metastatic adenopathy.     08/03/2020 Imaging   Bone Scan whole body  IMPRESSION: Increased radiotracer uptake in the mid to lower lumbar regions of uncertain etiology. Correlation with radiography to assess for potential arthropathy in these areas advised. Increased uptake in major joints is likely of arthropathic etiology. Increased uptake in the mid face is likely due to paranasal sinus disease. Distribution of radiotracer uptake in bony structures elsewhere unremarkable.   08/04/2020 Procedure   PAC placement by Dr [name]   08/05/2020 - 10/28/2020 Neo-Adjuvant Chemotherapy   Neoadjuvant THP q3weeks for 4-6 cycles starting 08/05/20, followed by Herceptin or Kadcyla q3weeks to complete 1 year of treatment. Stopped THP after 1 dose due to poor toleration       -I switched  her to [place] starting 08/25/20. Stopped after C2 due to poor toleration (10/28/20)   08/07/2020 Imaging   CT CAP  IMPRESSION: 2.7 cm mass in the upper outer left breast, likely corresponding to the patient's known primary breast neoplasm.   8 mm short axis left axillary node, suspicious for nodal metastasis in this clinical context.   No findings suspicious for distant metastasis.   Endometrium is mildly prominent, measuring 11 mm. Correlate for vaginal bleeding and consider pelvic ultrasound for further evaluation if clinically warranted.   08/11/2020 Pathology Results   Diagnosis Breast, right, needle core biopsy, right breast - PAPILLARY LESION - SEE COMMENT Microscopic Comment The biopsy consists of a papillary lesion with a focal area of adenosis. Immunohistochemistry (SMM, calponin and p63) shows retention of the myoepithelial cell layer in the focus of adenosis. 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 These results were called to [facility] on August 12, 2020.   10/27/2020 Imaging   CT Angio chest  IMPRESSION: Slightly suboptimal opacification of the main pulmonary artery, however no central or proximal segmental pulmonary embolism   Minimal bibasilar atelectasis   Aortic Atherosclerosis (ICD10-I70.0).   10/31/2020 Imaging   CT AP  IMPRESSION: 1. No acute finding by CT other than a large amount of stool and gas in the right colon and gas in the transverse colon. No small bowel obstruction. 2. Previous cholecystectomy. 3. Aortic atherosclerosis. 4. Umbilical hernia containing only fat. 5. Small amount of fluid in the endometrial canal as seen previously.   Aortic Atherosclerosis (ICD10-I70.0).   12/04/2020 Imaging   MRI Breast IMPRESSION: 1.  Evidence of a positive response to interval treatment. There has been a significant improvement of both the extent and strength of the enhancement throughout the outer  LEFT breast when compared to the earlier MRI of 07/31/2020. 2. However, there remains scattered areas of mass and non-mass enhancement throughout the outer LEFT breast, involving the upper outer quadrant and lower outer quadrant, as detailed above. 3. The 3 morphologically abnormal lymph nodes in the LEFT axilla identified on previous MRI, 1 a biopsy-proven metastasis, are all slightly smaller indicating an additional positive response to interval treatment. 4. No evidence of malignancy within the RIGHT breast.   12/10/2020 - 09/01/2021 Chemotherapy   Patient is on Treatment Plan : BREAST Trastuzumab + Perjeta (05/06/21) q21d      12/30/2020 Surgery   A. BREAST, LEFT, MASTECTOMY:  - Focal residual invasive ductal carcinoma status post neoadjuvant  treatment.  - Residual ductal carcinoma in situ.  - Margins of resection are not involved.  - Biopsy clip (X2).   B. LYMPH NODES, LEFT #1, DISSECTION:  - Three lymph nodes, negative for carcinoma (0/3).  - Biopsy clip (X1).   C. LYMPH NODES, LEFT #2, DISSECTION:  - Two lymph nodes, negative for carcinoma (0/2).   PROGNOSTIC INDICATOR RESULTS:  - The tumor cells are POSITIVE for Her2 (3+).  - Estrogen Receptor:       POSITIVE, 90%, STRONG STAINING  - Progesterone Receptor:   POSITIVE, 50%, MODERATE STAINING    12/30/2020 Cancer Staging   Staging form: Breast, AJCC 8th Edition - Pathologic stage from 12/30/2020: No Stage Recommended (ypT1a, pN0, cM0, G2, ER+, PR+, HER2+) - Signed by [name], MD on 01/20/2021 Stage prefix: Post-therapy Histologic grading system: 3 grade system Residual tumor (R): R0 - None    02/17/2021 - 04/08/2021 Radiation Therapy    Site Technique Total Dose (Gy) Dose per Fx (Gy) Completed Fx Beam Energies  Chest Wall, Left: CW_Lt 3D 50.4/50.4 1.8 28/28 10X  Chest Wall, Left: CW_Lt_SCLV 3D 50.4/50.4 1.8 28/28 6X, 15X  Chest Wall, Left: CW_Lt_Bst Electron 10/10 2 5/5 9E      INTERVAL HISTORY:  Ms. Mkrtchyan to  review her survivorship care plan detailing her treatment course for breast cancer, as well as monitoring long-term side effects of that treatment, education regarding health maintenance, screening, and overall wellness and health promotion.     Overall, Ms. Ellington reports feeling quite well.  She is taking Exemstane with good tolerance.    REVIEW OF SYSTEMS:  Review of Systems  Constitutional:  Negative for appetite change, chills, fatigue, fever and unexpected weight change.  HENT:   Negative for hearing loss, lump/mass and trouble swallowing.   Eyes:  Negative for eye problems and icterus.  Respiratory:  Negative for chest tightness, cough and shortness of breath.   Cardiovascular:  Negative for chest pain, leg swelling and palpitations.  Gastrointestinal:  Negative for abdominal distention, abdominal pain, constipation, diarrhea, nausea and vomiting.  Endocrine: Negative for hot flashes.  Genitourinary:  Negative for difficulty urinating.   Musculoskeletal:  Negative for arthralgias.  Skin:  Negative for itching and rash.  Neurological:  Negative for dizziness, extremity weakness, headaches and numbness.  Hematological:  Negative for adenopathy. Does not bruise/bleed easily.  Psychiatric/Behavioral:  Negative for depression. The patient is not nervous/anxious.   Breast: Denies any new nodularity, masses, tenderness, nipple changes, or nipple discharge.      ONCOLOGY TREATMENT TEAM:  1. Surgeon:  Dr. [name] at [facility] 2. Medical Oncologist: Dr. Burr Medico  3. Radiation Oncologist: Dr. Lisbeth Renshaw    PAST MEDICAL/SURGICAL HISTORY:  Past Medical History:  Diagnosis Date   Anxiety    Breast cancer ([place])    Cancer ([place])    CHF (congestive heart failure) ([facility])    Congestive heart failure (CHF) ([facility])    Depression    Diabetes mellitus without complication ([place])    type 2   Diabetes mellitus without complication ([facility])    GERD (gastroesophageal reflux disease)    High  cholesterol    History of kidney stones    Hypertension    PONV (postoperative nausea and vomiting)    Stroke [facility])    Past Surgical History:  Procedure Laterality Date   APPENDECTOMY     BREAST SURGERY     CHOLECYSTECTOMY     COLONOSCOPY     ESOPHAGOGASTRODUODENOSCOPY (EGD) WITH PROPOFOL N/A 11/01/2020   Procedure: ESOPHAGOGASTRODUODENOSCOPY (EGD) WITH PROPOFOL;  Surgeon: Arta Silence, MD;  Location: [facility];  Service: Endoscopy;  Laterality: N/A;   EYE SURGERY     cataracts   MASTECTOMY     MASTECTOMY WITH AXILLARY LYMPH NODE DISSECTION Left 12/30/2020   Procedure: LEFT MASTECTOMY WITH TARGETED AXILLARY LYMPH NODE DISSECTION;  Surgeon: [name], MD;  Location: [place];  Service: General;  Laterality: Left;   MULTIPLE TOOTH EXTRACTIONS     PORTACATH PLACEMENT N/A 08/04/2020   Procedure: INSERTION PORT-A-CATH WITH ULTRASOUND GUIDANCE;  Surgeon: [name], MD;  Location: [place];  Service: General;  Laterality: N/A;   STOMACH SURGERY       ALLERGIES:  Allergies  Allergen Reactions   Coconut (Cocos Nucifera) Allergy Skin Test     Hives and swells mouth   Hydrochlorothiazide Swelling    tongue   Lisinopril Swelling    Angioedema. Tolerates Losartan.   Coconut (Cocos Nucifera) Allergy Skin Test    Coconut Oil Hives   Hydrochlorothiazide Swelling   Other     Pt is allergic to a pain medication. Unsure of the name   Lisinopril Rash     CURRENT MEDICATIONS:  Outpatient Encounter Medications as of 01/28/2022  Medication Sig   Accu-Chek FastClix Lancets MISC 1 each by Other route in the morning, at noon, and at bedtime.   acetaminophen (TYLENOL) 500 MG tablet Take 1 tablet (500 mg total) by mouth every 6 (six) hours as needed for moderate pain, headache or mild pain.   albuterol (VENTOLIN HFA) 108 (90 Base) MCG/ACT inhaler Inhale 1-2 puffs into the lungs every 4 (four) hours as needed for wheezing or shortness of breath.   ALPRAZolam (XANAX) 0.25 MG tablet Take  1-2 tablets (0.25-0.5 mg total) by mouth 3 (three) times daily as needed for anxiety or sleep.   amLODipine (NORVASC) 10 MG tablet Take 10 mg by mouth daily.   aspirin 81 MG chewable tablet Chew 81 mg by mouth daily.    bisacodyl (DULCOLAX) 5 MG EC tablet Take 1 tablet (5 mg total) by mouth daily as needed for moderate constipation.   carvedilol (COREG) 25 MG tablet Take 25 mg by mouth 2 (two) times daily with a meal.   cholecalciferol (VITAMIN D) 25 MCG (1000 UNIT) tablet Take 1,000 Units by mouth at bedtime.   cycloSPORINE (RESTASIS) 0.05 % ophthalmic emulsion Place 1 drop into both eyes 2 (two) times daily.   diphenoxylate-atropine (LOMOTIL) 2.5-0.025 MG tablet Take 1-2 tablets by mouth 4 (four) times daily as needed for diarrhea or loose stools.   DULoxetine (CYMBALTA) 30 MG capsule Take 1 tablet by  mouth daily. 30 mg daily   DULoxetine (CYMBALTA) 30 MG capsule Take 30 mg by mouth daily.   exemestane (AROMASIN) 25 MG tablet Take 1 tablet (25 mg total) by mouth daily after breakfast.   folic acid (FOLVITE) 1 MG tablet Take 1 mg by mouth daily.   furosemide (LASIX) 20 MG tablet Take 1 tablet (20 mg total) by mouth daily as needed (for swelling or weight gain).   gabapentin (NEURONTIN) 400 MG capsule Take 400 mg by mouth daily.   Insulin Aspart (NOVOLOG FLEXPEN [place]) Inject 0-10 Units into the skin with breakfast, with lunch, and with evening meal. 0-150 = 0 units 150-199 = 2 units 200-249 = 4 units 250-299 = 6 units 300-349 = 8 units Greater than 350 = 10 units   insulin glargine (LANTUS) 100 UNIT/ML injection Inject 0.3 mLs (30 Units total) into the skin 2 (two) times daily.   lactulose (CHRONULAC) 10 GM/15ML solution Take 30 g by mouth daily.   lidocaine-prilocaine (EMLA) cream Apply 1 application topically as needed (pain). (Patient taking differently: Apply 1 application topically daily.)   losartan (COZAAR) 100 MG tablet Take 1 tablet (100 mg total) by mouth daily.   metFORMIN  (GLUCOPHAGE) 500 MG tablet Take 500 mg by mouth 2 (two) times daily with a meal.    methocarbamol (ROBAXIN) 500 MG tablet Take 500 mg by mouth every 8 (eight) hours as needed for muscle spasms.   Omega-3 Fatty Acids (FISH OIL) 1000 MG CAPS Take 1,000 mg by mouth daily.   omeprazole (PRILOSEC) 20 MG capsule Take 1 capsule (20 mg total) by mouth daily.   ondansetron (ZOFRAN-ODT) 4 MG disintegrating tablet Take 4 mg by mouth 2 (two) times daily as needed for nausea or vomiting.   potassium chloride (MICRO-K) 10 MEQ CR capsule Take 10 mEq by mouth daily.   pregabalin (LYRICA) 75 MG capsule TAKE 1 TABLET BY MOUTH 2 TIMES DAILY   prochlorperazine (COMPAZINE) 10 MG tablet Take 1 tablet (10 mg total) by mouth every 6 (six) hours as needed for nausea or vomiting.   PROMETHEGAN 25 MG suppository place 1 SUPPOSITORY rectally EVERY 8 HOURS AS NEEDED FOR NAUSEA AND VOMITING   RESTASIS 0.05 % ophthalmic emulsion Place 1 drop into both eyes 2 (two) times daily as needed (dry eyes).   rOPINIRole (REQUIP) 0.25 MG tablet TAKE 1 TABLET 3 TIMES DAILY x7 DAYS, THEN TAKE 2 TABLETS 3 TIMES DAILY x7 DAYS, THEN TAKE 3 TABLETS 3 TIMES DAILY x7 DAYS, THEN TAKE 4 TABLETS 3 TIMES DAILY   rosuvastatin (CRESTOR) 5 MG tablet Take 5 mg by mouth at bedtime.   spironolactone (ALDACTONE) 25 MG tablet Take 1 tablet (25 mg total) by mouth daily.   sucralfate (CARAFATE) 1 GM/10ML suspension Take 10 mLs (1 g total) by mouth 4 (four) times daily -  with meals and at bedtime.   torsemide (DEMADEX) 20 MG tablet TAKE 2 TABLETS BY MOUTH EVERY DAY   vitamin B-12 (CYANOCOBALAMIN) 1000 MCG tablet Take 1,000 mcg by mouth daily.   zolpidem (AMBIEN) 5 MG tablet Take 1 tablet (5 mg total) by mouth at bedtime as needed for sleep.   No facility-administered encounter medications on file as of 01/28/2022.     ONCOLOGIC FAMILY HISTORY:  Family History  Problem Relation Age of Onset   Heart attack Sister    Diabetes Sister    Heart attack Mother     Ovarian cancer Mother 94   Heart attack Brother    Cancer  Niece        bone cancer    Cancer Cousin        unknown type cancer    Cancer Cousin        metastatic cancer to bone      SOCIAL HISTORY:  Social History   Socioeconomic History   Marital status: Married    Spouse name: Not on file   Number of children: 2   Years of education: Not on file   Highest education level: Not on file  Occupational History   Occupation: retired Human resources officer  Tobacco Use   Smoking status: Never   Smokeless tobacco: Never  Vaping Use   Vaping Use: Never used  Substance and Sexual Activity   Alcohol use: Never    Comment: 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   Drug use: Never   Sexual activity: Not on file  Other Topics Concern   Not on file  Social History Narrative   ** Merged History Encounter **       Social Determinants of Health   Financial Resource Strain: Not on file  Food Insecurity: Not on file  Transportation Needs: Not on file  Physical Activity: Not on file  Stress: Not on file  Social Connections: Not on file  Intimate Partner Violence: Not on file     OBSERVATIONS/OBJECTIVE:  BP 129/88 (BP Location: Left Arm, Patient Position: Sitting)   Pulse 65   Temp (!) 97.2 F (36.2 C) (Temporal)   Resp 15   Wt 210 lb 11.2 oz (95.6 kg)   SpO2 97%   BMI 37.32 kg/m  GENERAL: Patient is a well appearing female in no acute distress HEENT:  Sclerae anicteric.  Oropharynx clear and moist. No ulcerations or evidence of oropharyngeal candidiasis. Neck is supple.  NODES:  No cervical, supraclavicular, or axillary lymphadenopathy palpated.  BREAST EXAM:  Deferred. LUNGS:  Clear to auscultation bilaterally.  No wheezes or rhonchi. HEART:  Regular rate and rhythm. No murmur appreciated. ABDOMEN:  Soft, nontender.  Positive, normoactive bowel sounds. No organomegaly palpated. MSK:  No focal spinal tenderness to palpation. Full range of motion bilaterally in the upper  extremities. EXTREMITIES:  No peripheral edema.   SKIN:  Clear with no obvious rashes or skin changes. No nail dyscrasia. NEURO:  Nonfocal. Well oriented.  Appropriate affect.   LABORATORY DATA:  None for this visit.  DIAGNOSTIC IMAGING:  None for this visit.      ASSESSMENT AND PLAN:  Ms.. Barbour is a pleasant 71 y.o. female with Stage IIA left breast invasive ductal carcinoma, ER+/PR+/HER2+, diagnosed in 06/2020, treated with neoadjuvant chemotherapy (difficulty tolerating) mastectomy, maintenance trastuzumab (stopped early d/t patient preference), adjuvant radiation therapy, and anti-estrogen therapy with Exemestane beginning in 11/2020.  She presents to the Survivorship Clinic for our initial meeting and routine follow-up post-completion of treatment for breast cancer.    1. Stage IIA left breast cancer:  Ms. Conerly is continuing to recover from definitive treatment for breast cancer. She will follow-up with her medical oncologist, Dr. Burr Medico in 04/2022 with history and physical exam per surveillance protocol.  She will continue her anti-estrogen therapy with Exemestane. Thus far, she is tolerating the Exemestane well, with minimal side effects. She was instructed to make Dr. Lindi Adie or myself aware if she begins to experience any worsening side effects of the medication and I could see her back in clinic to help manage those side effects, as needed. Her mammogram is due in 08/2022.  She is interested in pursuing genetic testing and I placed a referral for this today.    Today, a comprehensive survivorship care plan and treatment summary was reviewed with the patient today detailing her breast cancer diagnosis, treatment course, potential late/long-term effects of treatment, appropriate follow-up care with recommendations for the future, and patient education resources.  A copy of this summary, along with a letter will be sent to the patient's primary care provider via mail/fax/In Basket message  after today's visit.    2. Bone health:  Mckynna has not undergone bone density testing.  I placed orders for this to be completed as she is taking Exemestane which is associated with bone demineralization.  She was given education on specific activities to promote bone health.  3. Cancer screening:  Due to Ms. Viscuso's history and her age, she should receive screening for skin cancers, colon cancer.  The information and recommendations are listed on the patient's comprehensive care plan/treatment summary and were reviewed in detail with the patient.  She does not have a primary care provider, and I placed a referral to internal medicine today.    4. Health maintenance and wellness promotion: Ms. Mizrahi was encouraged to consume 5-7 servings of fruits and vegetables per day. We reviewed the "Nutrition Rainbow" handout.  She was also encouraged to engage in moderate to vigorous exercise for 30 minutes per day most days of the week. We discussed the [employer] fitness program, which is designed for cancer survivors to help them become more physically fit after cancer treatments.  She was instructed to limit her alcohol consumption and continue to abstain from tobacco use.     5. Support services/counseling: It is not uncommon for this period of the patient's cancer care trajectory to be one of many emotions and stressors. She was given information regarding our available services and encouraged to contact me with any questions or for help enrolling in any of our support group/programs.    Follow up instructions:    -Return to cancer center in 04/2022 for f/u with Dr. Burr Medico  -Mammogram due in 08/2022 -Follow up with surgery 1 year -Bone density testing ordered -referral to genetics  -She is welcome to return back to the Survivorship Clinic at any time; no additional follow-up needed at this time.  -Consider referral back to survivorship as a long-term survivor for continued surveillance  The  patient was provided an opportunity to ask questions and all were answered. The patient agreed with the plan and demonstrated an understanding of the instructions.   Total encounter time:60 minutes*in face-to-face visit time, chart review, lab review, care coordination, order entry, and documentation of the encounter time.    Wilber Bihari, NP 02/06/22 8:10 AM Medical Oncology and Hematology [facility] [place], [place] [address] Tel. [phone]    Fax. [phone]  *Total Encounter Time as defined by the Centers for Medicare and Medicaid Services includes, in addition to the face-to-face time of a patient visit (documented in the note above) non-face-to-face time: obtaining and reviewing outside history, ordering and reviewing medications, tests or procedures, care coordination (communications with other health care professionals or caregivers) and documentation in the medical record.

## 2022-01-31 ENCOUNTER — Telehealth: Payer: Self-pay | Admitting: Genetic Counselor

## 2022-01-31 NOTE — Telephone Encounter (Signed)
Scheduled appt per 5/12 referral. Called pt, no answer and no vm, phone just kept ringing. Mailed updated calendar to pt.  ?

## 2022-02-23 ENCOUNTER — Other Ambulatory Visit: Payer: Self-pay | Admitting: Internal Medicine

## 2022-03-01 ENCOUNTER — Inpatient Hospital Stay: Payer: Medicare HMO | Attending: Nurse Practitioner | Admitting: Genetic Counselor

## 2022-03-01 ENCOUNTER — Other Ambulatory Visit: Payer: Self-pay

## 2022-03-01 ENCOUNTER — Inpatient Hospital Stay: Payer: Medicare HMO

## 2022-03-01 ENCOUNTER — Encounter: Payer: Self-pay | Admitting: Genetic Counselor

## 2022-03-01 ENCOUNTER — Other Ambulatory Visit: Payer: Self-pay | Admitting: Lab

## 2022-03-01 ENCOUNTER — Other Ambulatory Visit: Payer: Self-pay | Admitting: Genetic Counselor

## 2022-03-01 DIAGNOSIS — Z1379 Encounter for other screening for genetic and chromosomal anomalies: Secondary | ICD-10-CM

## 2022-03-01 DIAGNOSIS — C50412 Malignant neoplasm of upper-outer quadrant of left female breast: Secondary | ICD-10-CM

## 2022-03-01 DIAGNOSIS — Z8041 Family history of malignant neoplasm of ovary: Secondary | ICD-10-CM

## 2022-03-01 DIAGNOSIS — Z170 Estrogen receptor positive status [ER+]: Secondary | ICD-10-CM

## 2022-03-01 LAB — GENETIC SCREENING ORDER

## 2022-03-01 NOTE — Progress Notes (Signed)
REFERRING PROVIDER: Gardenia Phlegm, NP [place],  [place] [address]  PRIMARY PROVIDER:  Cipriano Mile, NP  PRIMARY REASON FOR VISIT:  1. Malignant neoplasm of upper-outer quadrant of left breast in female, estrogen receptor positive ([place])   2. Family history of ovarian cancer     HISTORY OF PRESENT ILLNESS:   Cheryl Blankenship, a 71 y.o. female, was seen for a [facility] cancer genetics consultation at the request of Dr. Delice Bison due to a personal and family history of cancer.  Cheryl Blankenship presents to clinic today to discuss the possibility of a hereditary predisposition to cancer, to discuss genetic testing, and to further clarify her future cancer risks, as well as potential cancer risks for family members.   In October 2021, at the age of 89, Cheryl Blankenship was diagnosed with invasive ductal carcinoma of the left breast.    CANCER HISTORY:  Oncology History Overview Note  Cancer Staging Malignant neoplasm of upper-outer quadrant of left breast in female, estrogen receptor positive ([place]) Staging form: Breast, AJCC 8th Edition - Clinical stage from 07/16/2020: Stage IB (cT2, cN1, cM0, G2, ER+, PR+, HER2+) - Signed by [name], MD on 07/21/2020    Malignant neoplasm of upper-outer quadrant of left breast in female, estrogen receptor positive ([place])  07/01/2020 Mammogram   IMPRESSION: 1. There is a 12 cm span of highly suspicious calcifications involving the upper outer and upper inner quadrants of the left breast. On ultrasound, within this area of calcifications, there are confluent masses at 1 o'clock spanning at least 4.5 cm. An additional suspicious mass is seen in the left breast at 2 o'clock.  -Ultrasound targeted to the left breast at 1 o'clock, 5 cm from the nipple demonstrates a broad area of hypoechoic irregular tissue/confluent masses spanning at least 4.5 cm. There is a separate oval hypoechoic mass with indistinct margins at 2 o'clock, 7 cm from the  nipple measuring 0.9 x 0.4 x 0.9 cm. Ultrasound of the left axilla demonstrates 2 abnormally thickened lymph nodes with cortices measuring 6-7 mm. There is an additional lymph node with a borderline cortex of 4 mm.   2. There are 2 lymph nodes with thickened cortices in the left axilla, and an additional lymph node with a borderline thickened cortex.   07/16/2020 Cancer Staging   Staging form: Breast, AJCC 8th Edition - Clinical stage from 07/16/2020: Stage IIA (cT3, cN1, cM0, G2, ER+, PR+, HER2+) - Signed by [name], MD on 08/05/2020   07/16/2020 Initial Biopsy   Diagnosis 1. 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 Breast, left, needle core biopsy, 1 o'clock - INVASIVE DUCTAL CARCINOMA - SEE COMMENT 3. Lymph node, biopsy - METASTATIC CARCINOMA INVOLVING A LYMPH NODE - SEE COMMENT Microscopic Comment 1. Based on the biopsy, the ductal carcinoma in situ has a comedo pattern, high nuclear grade and measures 0.7 cm in greatest linear extent. Prognostic markers (ER/PR) are pending and will be reported in an addendum. 2. Based on the biopsy, the carcinoma appears Nottingham grade 2 of 3 and measures 1.5 cm in greatest linear extent. Prognostic markers (ER/PR/ki-67/HER2) are pending and will be reported in an addendum. Dr. Saralyn Pilar reviewed the case and agrees with the above diagnosis. These results were called to [facility] on July 17, 2020. 3. Based on the biopsy, the carcinoma appears measures 1.0 cm in greatest linear extent. Prognostic  markers (ER/PR/ki-67/HER2) are pending and will be reported in an addendum.   07/16/2020 Receptors her2   1. PROGNOSTIC INDICATORS Results: IMMUNOHISTOCHEMICAL AND MORPHOMETRIC ANALYSIS PERFORMED MANUALLY Estrogen Receptor: 30%, POSITIVE, STRONG STAINING INTENSITY Progesterone Receptor: 5%, POSITIVE, STRONG  STAINING INTENSITY  2. PROGNOSTIC INDICATORS Results: IMMUNOHISTOCHEMICAL AND MORPHOMETRIC ANALYSIS PERFORMED MANUALLY The tumor cells are POSITIVE for Her2 (3+). Estrogen Receptor: 85%, POSITIVE, STRONG STAINING INTENSITY Progesterone Receptor: 60%, POSITIVE, STRONG STAINING INTENSITY Proliferation Marker Ki67: 25%  3. PROGNOSTIC INDICATORS Results: IMMUNOHISTOCHEMICAL AND MORPHOMETRIC ANALYSIS PERFORMED MANUALLY The tumor cells are POSITIVE for Her2 (3+). Estrogen Receptor: 95%, POSITIVE, STRONG STAINING INTENSITY Progesterone Receptor: 45%, POSITIVE, STRONG STAINING INTENSITY Proliferation Marker Ki67: 30%   07/20/2020 Initial Diagnosis   Malignant neoplasm of upper-outer quadrant of left breast in female, estrogen receptor positive ([place])   07/31/2020 Echocardiogram   Baseline Echo  IMPRESSIONS     1. Left ventricular ejection fraction, by estimation, is 65 to 70%. The  left ventricle has normal function. The left ventricle has no regional  wall motion abnormalities. Left ventricular diastolic parameters are  indeterminate. The average left  ventricular global longitudinal strain is -17.1 %.   2. Right ventricular systolic function is normal. The right ventricular  size is normal. There is normal pulmonary artery systolic pressure.   3. The mitral valve is normal in structure. Trivial mitral valve  regurgitation. No evidence of mitral stenosis.   4. The aortic valve is normal in structure. Aortic valve regurgitation is  not visualized. No aortic stenosis is present.    07/31/2020 Breast MRI   IMPRESSION: 1. Extensive non mass enhancement within the left breast extending from the nipple to the chest wall. Overall findings are most compatible with extensive malignancy involving the majority of the left breast. 2. Indeterminate enhancing mass within the upper inner right breast. 3. Three morphologically abnormal lymph nodes within the left axilla. One of these nodes has  been biopsied compatible with metastatic adenopathy.     08/03/2020 Imaging   Bone Scan whole body  IMPRESSION: Increased radiotracer uptake in the mid to lower lumbar regions of uncertain etiology. Correlation with radiography to assess for potential arthropathy in these areas advised. Increased uptake in major joints is likely of arthropathic etiology. Increased uptake in the mid face is likely due to paranasal sinus disease. Distribution of radiotracer uptake in bony structures elsewhere unremarkable.   08/04/2020 Procedure   PAC placement by Dr [name]   08/05/2020 - 10/28/2020 Neo-Adjuvant Chemotherapy   Neoadjuvant THP q3weeks for 4-6 cycles starting 08/05/20, followed by Herceptin or Kadcyla q3weeks to complete 1 year of treatment. Stopped THP after 1 dose due to poor toleration       -I switched her to [place] starting 08/25/20. Stopped after C2 due to poor toleration (10/28/20)   08/07/2020 Imaging   CT CAP  IMPRESSION: 2.7 cm mass in the upper outer left breast, likely corresponding to the patient's known primary breast neoplasm.   8 mm short axis left axillary node, suspicious for nodal metastasis in this clinical context.   No findings suspicious for distant metastasis.   Endometrium is mildly prominent, measuring 11 mm. Correlate for vaginal bleeding and consider pelvic ultrasound for further evaluation if clinically warranted.   08/11/2020 Pathology Results   Diagnosis Breast, right, needle core biopsy, right breast - PAPILLARY LESION - SEE COMMENT Microscopic Comment The biopsy consists of a papillary lesion with a focal area of adenosis. Immunohistochemistry (SMM, calponin and p63) shows retention of the myoepithelial  cell layer in the focus of adenosis. 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 These results were called to [facility] on August 12, 2020.   10/27/2020 Imaging   CT Angio chest   IMPRESSION: Slightly suboptimal opacification of the main pulmonary artery, however no central or proximal segmental pulmonary embolism   Minimal bibasilar atelectasis   Aortic Atherosclerosis (ICD10-I70.0).   10/31/2020 Imaging   CT AP  IMPRESSION: 1. No acute finding by CT other than a large amount of stool and gas in the right colon and gas in the transverse colon. No small bowel obstruction. 2. Previous cholecystectomy. 3. Aortic atherosclerosis. 4. Umbilical hernia containing only fat. 5. Small amount of fluid in the endometrial canal as seen previously.   Aortic Atherosclerosis (ICD10-I70.0).   12/04/2020 Imaging   MRI Breast IMPRESSION: 1. Evidence of a positive response to interval treatment. There has been a significant improvement of both the extent and strength of the enhancement throughout the outer LEFT breast when compared to the earlier MRI of 07/31/2020. 2. However, there remains scattered areas of mass and non-mass enhancement throughout the outer LEFT breast, involving the upper outer quadrant and lower outer quadrant, as detailed above. 3. The 3 morphologically abnormal lymph nodes in the LEFT axilla identified on previous MRI, 1 a biopsy-proven metastasis, are all slightly smaller indicating an additional positive response to interval treatment. 4. No evidence of malignancy within the RIGHT breast.   12/10/2020 - 09/01/2021 Chemotherapy   Patient is on Treatment Plan : BREAST Trastuzumab + Perjeta (05/06/21) q21d      12/30/2020 Surgery   A. BREAST, LEFT, MASTECTOMY:  - Focal residual invasive ductal carcinoma status post neoadjuvant  treatment.  - Residual ductal carcinoma in situ.  - Margins of resection are not involved.  - Biopsy clip (X2).   B. LYMPH NODES, LEFT #1, DISSECTION:  - Three lymph nodes, negative for carcinoma (0/3).  - Biopsy clip (X1).   C. LYMPH NODES, LEFT #2, DISSECTION:  - Two lymph nodes, negative for carcinoma (0/2).    PROGNOSTIC INDICATOR RESULTS:  - The tumor cells are POSITIVE for Her2 (3+).  - Estrogen Receptor:       POSITIVE, 90%, STRONG STAINING  - Progesterone Receptor:   POSITIVE, 50%, MODERATE STAINING    12/30/2020 Cancer Staging   Staging form: Breast, AJCC 8th Edition - Pathologic stage from 12/30/2020: No Stage Recommended (ypT1a, pN0, cM0, G2, ER+, PR+, HER2+) - Signed by [name], MD on 01/20/2021 Stage prefix: Post-therapy Histologic grading system: 3 grade system Residual tumor (R): R0 - None   02/17/2021 - 04/08/2021 Radiation Therapy    Site Technique Total Dose (Gy) Dose per Fx (Gy) Completed Fx Beam Energies  Chest Wall, Left: CW_Lt 3D 50.4/50.4 1.8 28/28 10X  Chest Wall, Left: CW_Lt_SCLV 3D 50.4/50.4 1.8 28/28 6X, 15X  Chest Wall, Left: CW_Lt_Bst Electron 10/10 2 5/5 9E       RISK FACTORS:  First live birth at age 19.  OCP use for approximately 0 years.  Ovaries intact: yes.  Uterus intact: yes.  Menopausal status: postmenopausal.  HRT use: 0 years. Colonoscopy: no Mammogram within the last year: yes. Any excessive radiation exposure in the past: no  Past Medical History:  Diagnosis Date   Anxiety    Breast cancer ([place])    Cancer ([place])    CHF (congestive heart failure) ([facility])    Congestive heart failure (CHF) ([place])    Depression  Diabetes mellitus without complication ([place])    type 2   Diabetes mellitus without complication ([facility])    GERD (gastroesophageal reflux disease)    High cholesterol    History of kidney stones    Hypertension    PONV (postoperative nausea and vomiting)    Stroke [facility])     Past Surgical History:  Procedure Laterality Date   APPENDECTOMY     BREAST SURGERY     CHOLECYSTECTOMY     COLONOSCOPY     ESOPHAGOGASTRODUODENOSCOPY (EGD) WITH PROPOFOL N/A 11/01/2020   Procedure: ESOPHAGOGASTRODUODENOSCOPY (EGD) WITH PROPOFOL;  Surgeon: Arta Silence, MD;  Location: [facility];  Service: Endoscopy;  Laterality: N/A;   EYE SURGERY      cataracts   MASTECTOMY     MASTECTOMY WITH AXILLARY LYMPH NODE DISSECTION Left 12/30/2020   Procedure: LEFT MASTECTOMY WITH TARGETED AXILLARY LYMPH NODE DISSECTION;  Surgeon: [name], MD;  Location: [facility];  Service: General;  Laterality: Left;   MULTIPLE TOOTH EXTRACTIONS     PORTACATH PLACEMENT N/A 08/04/2020   Procedure: INSERTION PORT-A-CATH WITH ULTRASOUND GUIDANCE;  Surgeon: [name], MD;  Location: [facility];  Service: General;  Laterality: N/A;   STOMACH SURGERY      Social History   Socioeconomic History   Marital status: Married    Spouse name: Not on file   Number of children: 2   Years of education: Not on file   Highest education level: Not on file  Occupational History   Occupation: retired Marine scientist aid  Tobacco Use   Smoking status: Never   Smokeless tobacco: Never  Vaping Use   Vaping Use: Never used  Substance and Sexual Activity   Alcohol use: Never    Comment: 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  Significant diagnoses are listed below: Family History  Problem Relation Age of Onset   Heart attack Mother    Ovarian cancer Mother 67   Heart attack Sister    Diabetes Sister    Heart attack Brother    Colon cancer Maternal Grandmother    Bone cancer Cousin    Colon cancer Cousin    Cancer Niece        gynecological cancer      Cheryl Blankenship's mother was diagnosed with ovarian cancer at age 69, she is deceased. Her niece was diagnosed with a gynecological cancer in her 17s. She has a  maternal cousin who had both colon and bone cancer, she is deceased. Her maternal grandmother was diagnosed with colon cancer at an unknown age (>50), she is deceased. Her maternal great aunt (grandmother's sister) was diagnosed with breast cancer at an unknown age, she is deceased.  Cheryl Blankenship is unaware of previous family history of genetic testing for hereditary cancer risks. There is no reported Ashkenazi Jewish ancestry.   GENETIC COUNSELING ASSESSMENT: Cheryl Blankenship is a 71 y.o. female with a personal and family history of cancer which is somewhat suggestive  of a hereditary predisposition to cancer given her mother's history of ovarian cancer. We, therefore, discussed and recommended the following at today's visit.   DISCUSSION: We discussed that 5 - 10% of cancer is hereditary, with most cases of breast cancer associated with BRCA1/2.  There are other genes that can be associated with hereditary breast cancer syndromes.  We discussed that testing is beneficial for several reasons including knowing how to follow individuals after completing their treatment, identifying whether potential treatment options would be beneficial, and understanding if other family members could be at risk for cancer and allowing them to undergo genetic testing.   We reviewed the characteristics, features and inheritance patterns of hereditary cancer syndromes. We also discussed genetic testing, including the appropriate family members to test, the process of testing, insurance coverage and turn-around-time for results. We discussed the implications of a negative, positive, carrier and/or variant of uncertain significant result. We recommended Cheryl Blankenship pursue genetic testing for a panel that includes genes associated with breast, ovarian, and colon cancer.   Cheryl Blankenship elected to have [place] Panel. The CustomNext-Cancer+RNAinsight panel offered by [facility] includes sequencing and rearrangement analysis for  the following 47 genes:  APC, ATM, AXIN2, BARD1, BMPR1A, BRCA1, BRCA2, BRIP1, CDH1, CDK4, CDKN2A, CHEK2, CTNNA1, DICER1, EPCAM, GREM1, HOXB13, KIT, MEN1, MLH1, MSH2, MSH3, MSH6, MUTYH, NBN, NF1, NTHL1, PALB2, PDGFRA, PMS2, POLD1, POLE, PTEN, RAD50, RAD51C, RAD51D, SDHA, SDHB, SDHC, SDHD, SMAD4, SMARCA4, STK11, TP53, TSC1, TSC2, and VHL.  RNA data is routinely analyzed for use in variant interpretation for all genes.  Based on Cheryl Blankenship's personal and family history of cancer, she meets medical criteria for genetic testing. Despite that she meets criteria, she may still have an out of pocket cost. We discussed that if her out of pocket cost for testing is over $100, the laboratory will call and confirm whether she wants to proceed with testing.  If the out of pocket cost of testing is less than $100 she will be billed by the genetic testing laboratory.   PLAN: After considering the risks, benefits, and limitations, Cheryl Blankenship provided informed consent to pursue genetic testing and the blood sample was sent to [facility] for analysis of the CustomNext Panel. Results should be available within approximately 2-3 weeks' time, at which point they will be disclosed by telephone to Cheryl Blankenship, as will any additional recommendations warranted by these results. Cheryl Blankenship will receive a summary of her genetic counseling visit and a copy of her results once available. This information will also be available in Epic.   Cheryl Blankenship questions were answered to her satisfaction today. Our contact information was provided should additional questions or concerns arise. Thank you for the referral and allowing [place] to share in the care of your patient.   Lucille Passy, MS, [facility] Genetic Counselor [email or web address] (P) [phone]  The patient was seen for a total of 30 minutes in face-to-face genetic counseling.  The patient brought her daughter-in-law.  Drs. Lindi Adie and/or [facility] Medico were available to  discuss this case as needed.   _______________________________________________________________________ For Office Staff:  Number of people involved in session: 2 Was an Intern/ student involved with case: no

## 2022-03-02 ENCOUNTER — Emergency Department (HOSPITAL_COMMUNITY)
Admission: EM | Admit: 2022-03-02 | Discharge: 2022-03-03 | Disposition: A | Discharge status: Home / Self Care | Payer: Medicare HMO | Attending: Student | Admitting: Student

## 2022-03-02 ENCOUNTER — Other Ambulatory Visit: Payer: Self-pay

## 2022-03-02 ENCOUNTER — Encounter (HOSPITAL_COMMUNITY): Payer: Self-pay | Admitting: Emergency Medicine

## 2022-03-02 DIAGNOSIS — E119 Type 2 diabetes mellitus without complications: Secondary | ICD-10-CM | POA: Diagnosis not present

## 2022-03-02 DIAGNOSIS — H9201 Otalgia, right ear: Secondary | ICD-10-CM | POA: Diagnosis present

## 2022-03-02 DIAGNOSIS — Z7982 Long term (current) use of aspirin: Secondary | ICD-10-CM | POA: Insufficient documentation

## 2022-03-02 DIAGNOSIS — R6884 Jaw pain: Secondary | ICD-10-CM | POA: Insufficient documentation

## 2022-03-02 DIAGNOSIS — Z853 Personal history of malignant neoplasm of breast: Secondary | ICD-10-CM | POA: Insufficient documentation

## 2022-03-02 DIAGNOSIS — H6121 Impacted cerumen, right ear: Secondary | ICD-10-CM

## 2022-03-02 DIAGNOSIS — Z7984 Long term (current) use of oral hypoglycemic drugs: Secondary | ICD-10-CM | POA: Diagnosis not present

## 2022-03-02 DIAGNOSIS — M542 Cervicalgia: Secondary | ICD-10-CM | POA: Insufficient documentation

## 2022-03-02 DIAGNOSIS — Z794 Long term (current) use of insulin: Secondary | ICD-10-CM | POA: Insufficient documentation

## 2022-03-02 DIAGNOSIS — I509 Heart failure, unspecified: Secondary | ICD-10-CM | POA: Diagnosis not present

## 2022-03-02 MED ORDER — OFLOXACIN 0.3 % OT SOLN: 5.0000 [drp] | Freq: Two times a day (BID) | OTIC | 0 refills | Status: AC

## 2022-03-02 NOTE — Discharge Instructions (Signed)
Your pain was from a buildup of earwax, please refrain from placing anything into your ear.  Have started on eardrops please take as prescribed.  May use over-the-counter pain medication as needed.  Follow-up with your PCP as needed.  Come back to the emergency department if you develop chest pain, shortness of breath, severe abdominal pain, uncontrolled nausea, vomiting, diarrhea.

## 2022-03-02 NOTE — ED Provider Notes (Signed)
[facility] DEPT Provider Note   CSN: [id number] Arrival date & time: 03/02/22  1955     History  Chief Complaint  Patient presents with   Oral Pain    Cheryl Blankenship is a 71 y.o. female.  HPI   Patient with medical history including diabetes, CHF, status post breast cancer with left mastectomy, left port placement, currently not on chemotherapy presents with complaints of left jaw pain.  Patient's right jaw pain going for last 5 days, she states she feels pain coming from her right ear going into her right jaw and down her right neck.  She denies any dental pain no dental cavities, no oral trauma, she endorses that she has had decrease in hearing, she denies any ear trauma, no drainage of her ear states that she has been placing cotton balls and oils in it to help with the pain.  She states that she has a headache but states this headache been going on for last 2 years, it is unchanged,, she denies any weakness, but has occasional paresthesia in her hands bilaterally none currently at this time, no recent falls not on anticoag's.  She has no other complaints at this time.  She states that she is had this pain in the past and was worked up and was all unremarkable.  States this pain away on its own.  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    Home Medications Prior to Admission medications   Medication Sig Start Date End Date Taking? Authorizing Provider  ofloxacin (FLOXIN) 0.3 % OTIC solution Place 5 drops into the right ear 2 (two) times daily for 5 days. 03/02/22 03/07/22 Yes Marcello Fennel, PA-C  Accu-Chek FastClix Lancets MISC 1 each by Other route in the morning, at noon, and at bedtime. 08/14/20   [provider]  acetaminophen (TYLENOL) 500 MG tablet Take 1 tablet (500 mg total) by mouth every 6 (six) hours  as needed for moderate pain, headache or mild pain. 08/09/21   Thurnell Lose, MD  albuterol (VENTOLIN HFA) 108 (90 Base) MCG/ACT inhaler Inhale 1-2 puffs into the lungs every 4 (four) hours as needed for wheezing or shortness of breath. 09/01/20   Alla Feeling, NP  ALPRAZolam Duanne Moron) 0.25 MG tablet Take 1-2 tablets (0.25-0.5 mg total) by mouth 3 (three) times daily as needed for anxiety or sleep. 02/26/21   [name], MD  amLODipine (NORVASC) 10 MG tablet Take 10 mg by mouth daily.    [provider]  aspirin 81 MG chewable tablet Chew 81 mg by mouth daily.     [provider]  bisacodyl (DULCOLAX) 5 MG EC tablet Take 1 tablet (5 mg total) by mouth daily as needed for moderate constipation. 11/04/20   Antonieta Pert, MD  carvedilol (COREG) 25 MG tablet Take 25 mg by mouth 2 (two) times daily with a meal.    [provider]  cholecalciferol (VITAMIN D) 25 MCG (1000 UNIT) tablet Take 1,000 Units by mouth at bedtime.    [provider]  cycloSPORINE (RESTASIS) 0.05 % ophthalmic emulsion Place 1 drop into both eyes 2 (two) times daily.    [provider]  diphenoxylate-atropine (LOMOTIL) 2.5-0.025 MG tablet Take 1-2 tablets by mouth 4 (four) times daily as needed for diarrhea or loose stools. Patient not taking: Reported on  01/28/2022    [provider]  DULoxetine (CYMBALTA) 30 MG capsule Take 1 tablet by mouth daily. 30 mg daily 05/26/21   [provider]  exemestane (AROMASIN) 25 MG tablet Take 1 tablet (25 mg total) by mouth daily after breakfast. 10/26/21   [name], MD  folic acid (FOLVITE) 1 MG tablet Take 1 mg by mouth daily. 07/14/20   [provider]  furosemide (LASIX) 20 MG tablet Take 1 tablet (20 mg total) by mouth daily as needed (for swelling or weight gain). 06/14/21   O'NealCassie Freer, MD  gabapentin (NEURONTIN) 400 MG capsule Take 400 mg by mouth daily.    [provider]  glipiZIDE (GLUCOTROL XL) 5 MG  24 hr tablet Take 1 tablet by mouth daily with breakfast. 11/09/20   [provider]  Insulin Aspart (NOVOLOG FLEXPEN [place]) Inject 0-10 Units into the skin with breakfast, with lunch, and with evening meal. 0-150 = 0 units 150-199 = 2 units 200-249 = 4 units 250-299 = 6 units 300-349 = 8 units Greater than 350 = 10 units    [provider]  insulin glargine (LANTUS) 100 UNIT/ML injection Inject 0.3 mLs (30 Units total) into the skin 2 (two) times daily. 08/09/21   Thurnell Lose, MD  JARDIANCE 10 MG TABS tablet Take by mouth. 01/03/22   [provider]  lactulose (CHRONULAC) 10 GM/15ML solution Take 30 g by mouth daily.    [provider]  losartan (COZAAR) 100 MG tablet Take 1 tablet (100 mg total) by mouth daily. 06/14/21   Geralynn Rile, MD  metFORMIN (GLUCOPHAGE) 500 MG tablet Take 500 mg by mouth 2 (two) times daily with a meal.  06/25/20   [provider]  methocarbamol (ROBAXIN) 500 MG tablet Take 500 mg by mouth every 8 (eight) hours as needed for muscle spasms.    [provider]  Omega-3 Fatty Acids (FISH OIL) 1000 MG CAPS Take 1,000 mg by mouth daily.    [provider]  omeprazole (PRILOSEC) 20 MG capsule Take 1 capsule (20 mg total) by mouth daily. 10/09/20   Geralynn Rile, MD  ondansetron (ZOFRAN-ODT) 4 MG disintegrating tablet Take 4 mg by mouth 2 (two) times daily as needed for nausea or vomiting. Patient not taking: Reported on 01/28/2022    [provider]  potassium chloride (MICRO-K) 10 MEQ CR capsule Take 10 mEq by mouth daily.    [provider]  pregabalin (LYRICA) 75 MG capsule TAKE 1 CAPSULE BY MOUTH 2 TIMES DAILY 02/23/22   Vaslow, Acey Lav, MD  PROMETHEGAN 25 MG suppository place 1 SUPPOSITORY rectally EVERY 8 HOURS AS NEEDED FOR NAUSEA AND VOMITING Patient not taking: Reported on 01/28/2022 06/09/21   [name], MD  RESTASIS 0.05 % ophthalmic emulsion Place 1 drop into both eyes 2  (two) times daily as needed (dry eyes). 07/13/20   [provider]  rOPINIRole (REQUIP) 0.25 MG tablet TAKE 1 TABLET 3 TIMES DAILY x7 DAYS, THEN TAKE 2 TABLETS 3 TIMES DAILY x7 DAYS, THEN TAKE 3 TABLETS 3 TIMES DAILY x7 DAYS, THEN TAKE 4 TABLETS 3 TIMES DAILY 07/20/21   Vaslow, Acey Lav, MD  rosuvastatin (CRESTOR) 5 MG tablet Take 5 mg by mouth at bedtime.    [provider]  spironolactone (ALDACTONE) 25 MG tablet Take 1 tablet (25 mg total) by mouth daily. 06/14/21   O'NealCassie Freer, MD  sucralfate (CARAFATE) 1 GM/10ML suspension Take 10 mLs (1 g total)  by mouth 4 (four) times daily -  with meals and at bedtime. Patient not taking: Reported on 01/28/2022 11/04/20   Antonieta Pert, MD  vitamin B-12 (CYANOCOBALAMIN) 1000 MCG tablet Take 1,000 mcg by mouth daily.    [provider]  zolpidem (AMBIEN) 5 MG tablet Take 1 tablet (5 mg total) by mouth at bedtime as needed for sleep. 10/28/20   [name], MD      Allergies    Cocos nucifera, Hydrochlorothiazide, Lisinopril, Coconut (cocos nucifera), Cocos nucifera, Hydrochlorothiazide, Other, and Lisinopril    Review of Systems   Review of Systems  Constitutional:  Negative for chills and fever.  HENT:  Positive for ear pain and hearing loss.   Respiratory:  Negative for shortness of breath.   Cardiovascular:  Negative for chest pain.  Gastrointestinal:  Negative for abdominal pain.  Neurological:  Negative for headaches.    Physical Exam Updated Vital Signs BP (!) 146/99   Pulse 71   Temp 98.3 F (36.8 C) (Oral)   Resp 14   SpO2 97%  Physical Exam Vitals and nursing note reviewed.  Constitutional:      General: She is not in acute distress.    Appearance: She is not ill-appearing.  HENT:     Head: Normocephalic and atraumatic.     Comments: No deformity of the head present no raccoon eyes or battle sign noted.    Right Ear: Ear canal and external ear normal. There is impacted cerumen.     Left Ear: Tympanic  membrane, ear canal and external ear normal.     Ears:     Comments: No ear protrusion no tenderness along the mastoids, no overlying skin changes, left ear canal and TMs were unremarkable for signs infection TM was fully intact, right ear canal was packed with cerumen, unable to visualize the TM.    Nose: No congestion.     Mouth/Throat:     Mouth: Mucous membranes are moist.     Pharynx: Oropharynx is clear. No oropharyngeal exudate or posterior oropharyngeal erythema.     Comments: No trismus no torticollis patient's had multiple teeth pulled, of the teeth remaining no evidence of infection present, gum lines were palpated they are nontender on my exam no evidence of gingivitis no submandibular swelling. Eyes:     Extraocular Movements: Extraocular movements intact.     Conjunctiva/sclera: Conjunctivae normal.     Pupils: Pupils are equal, round, and reactive to light.  Cardiovascular:     Rate and Rhythm: Normal rate and regular rhythm.     Pulses: Normal pulses.     Heart sounds: No murmur heard.    No friction rub. No gallop.  Pulmonary:     Effort: No respiratory distress.     Breath sounds: No wheezing, rhonchi or rales.  Abdominal:     Palpations: Abdomen is soft.     Tenderness: There is no abdominal tenderness. There is no right CVA tenderness or left CVA tenderness.  Skin:    General: Skin is warm and dry.  Neurological:     Mental Status: She is alert.     GCS: GCS eye subscore is 4. GCS verbal subscore is 5. GCS motor subscore is 6.     Cranial Nerves: Cranial nerves 2-12 are intact. No cranial nerve deficit.     Sensory: Sensation is intact.     Motor: No weakness.     Coordination: Romberg sign negative. Finger-Nose-Finger Test normal.     Comments:  Cranial nerves II through XII grossly intact no difficulty word finding following two-step commands no unilateral weakness present.  Psychiatric:        Mood and Affect: Mood normal.     ED Results / Procedures /  Treatments   Labs (all labs ordered are listed, but only abnormal results are displayed) Labs Reviewed - No data to display  EKG None  Radiology No results found.  Procedures .Ear Cerumen Removal  Date/Time: 03/02/2022 11:20 PM  Performed by: Marcello Fennel, PA-C Authorized by: Marcello Fennel, PA-C   Consent:    Consent obtained:  Verbal   Consent given by:  Patient   Risks, benefits, and alternatives were discussed: yes     Risks discussed:  Bleeding, dizziness, incomplete removal, infection, pain and TM perforation   Alternatives discussed:  No treatment, delayed treatment, alternative treatment, observation and referral Universal protocol:    Patient identity confirmed:  Verbally with patient Procedure details:    Location:  R ear   Procedure type: curette     Procedure outcomes: cerumen removed   Post-procedure details:    Inspection:  No bleeding, ear canal clear, TM intact and macerated skin   Hearing quality:  Improved   Procedure completion:  Tolerated well, no immediate complications     Medications Ordered in ED Medications - No data to display  ED Course/ Medical Decision Making/ A&P                           Medical Decision Making  This patient presents to the ED for concern of right-sided ear and facial pain, this involves an extensive number of treatment options, and is a complaint that carries with it a high risk of complications and morbidity.  The differential diagnosis includes otitis media, otitis externa, dental abscess, metastatic disease, carotid dissection    Additional history obtained:  Additional history obtained from son at bedside External records from outside source obtained and reviewed including previous oncology notes   Co morbidities that complicate the patient evaluation  Metastatic disease  Social Determinants of Health:  Geriatric    Lab Tests:  I Ordered, and personally interpreted labs.  The pertinent  results include: N/A   Imaging Studies ordered:  I ordered imaging studies including N/A I independently visualized and interpreted imaging which showed N/A I agree with the radiologist interpretation   Cardiac Monitoring:  The patient was maintained on a cardiac monitor.  I personally viewed and interpreted the cardiac monitored which showed an underlying rhythm of: N/A   Medicines ordered and prescription drug management:  I ordered medication including N/A I have reviewed the patients home medicines and have made adjustments as needed  Critical Interventions:  N/A   Reevaluation:  Patient had a benign physical exam, she had noted large cerumen impaction in the right ear likely causing her pain, I was able to remove wax with ear curette, TMs were visualized no evidence of infection, she states she feels much better and has no pain at this time, she is ready for discharge.  Consultations Obtained:  N/A    Test Considered:  CT head-we will defer as my suspicion for intracranial head bleed or mets within the brain is very low at this time, presentation atypical, she has had MRI less than 5 months ago which were unremarkable.    Rule out I have low suspicion for carotid dissection as presentation atypical, more consistent with likely cerumen  pack meant as her pain completely resolved after the cerumen was removed.  I have low suspicion for dental abscess as no evidence of dental infection to my exam.  I have low suspicion for CVA as there is no focal deficit noted my exam.    Dispostion and problem list  After consideration of the diagnostic results and the patients response to treatment, I feel that the patent would benefit from discharge.  Cerumen impactment-was able to remove the cerumen, she does have some maceration after yours was removed, will place on drops to decrease risk of infection, have her follow-up with her PCP for reassessment strict return  precautions.            Final Clinical Impression(s) / ED Diagnoses Final diagnoses:  Impacted cerumen of right ear    Rx / DC Orders ED Discharge Orders          Ordered    ofloxacin (FLOXIN) 0.3 % OTIC solution  2 times daily        03/02/22 2323              Marcello Fennel, PA-C 03/02/22 2324    Molpus, Jenny Reichmann, MD 03/03/22 [phone]

## 2022-03-02 NOTE — ED Triage Notes (Signed)
  Patient comes in with mouth pain that has been going on for 5 days.  Patient states she has a port in her R chest and has been getting chemotherapy for breast cancer.  Patient states the right side of her mouth and jaw hurts.  Patient states she also has bilateral hand numbness.  Pain 10/10, stabbing pain in jaw.

## 2022-03-03 MED ORDER — ACETAMINOPHEN 325 MG PO TABS
650.0000 mg | ORAL_TABLET | Freq: Once | ORAL | Status: AC
  Administered 2022-03-03: 650 mg via ORAL
  Filled 2022-03-03: qty 2

## 2022-03-03 NOTE — ED Notes (Signed)
I provided reinforced discharge education based off of discharge instructions. Pt acknowledged and understood my education. Pt had no further questions/concerns for provider/myself.  °

## 2022-03-11 ENCOUNTER — Telehealth: Payer: Self-pay | Admitting: Genetic Counselor

## 2022-03-11 ENCOUNTER — Encounter: Payer: Self-pay | Admitting: Genetic Counselor

## 2022-03-11 DIAGNOSIS — Z1379 Encounter for other screening for genetic and chromosomal anomalies: Secondary | ICD-10-CM | POA: Insufficient documentation

## 2022-03-18 NOTE — Telephone Encounter (Signed)
I attempted to contact Ms. Peckman to discuss her genetic testing results. I was unable to leave a voicemail. I also attempted to contact her daughter-in-law and left a voicemail requesting she call me back at [phone].  Lucille Passy, MS, [facility] Genetic Counselor [email or web address] (P) [phone]

## 2022-03-25 ENCOUNTER — Telehealth: Payer: Self-pay | Admitting: Genetic Counselor

## 2022-03-25 NOTE — Telephone Encounter (Addendum)
I contacted Ms. Vink to discuss her genetic testing results. However, she did not answer. Per her request, I called her daughter-in-law who accompanied her to the appointment to discuss the results. No pathogenic variants were identified in the 47 genes analyzed. Detailed clinic note to follow.  The test report has been scanned into EPIC and is located under the Molecular Pathology section of the Results Review tab.  A portion of the result report is included below for reference.   Lucille Passy, MS, [facility] Genetic Counselor [email or web address] (P) [phone]

## 2022-03-28 ENCOUNTER — Ambulatory Visit: Payer: Self-pay | Admitting: Genetic Counselor

## 2022-03-28 DIAGNOSIS — Z1379 Encounter for other screening for genetic and chromosomal anomalies: Secondary | ICD-10-CM

## 2022-03-28 NOTE — Progress Notes (Signed)
HPI:   Cheryl Blankenship was previously seen in the [facility] due to a personal and family history of cancer and concerns regarding a hereditary predisposition to cancer. Please refer to our prior cancer genetics clinic note for more information regarding our discussion, assessment and recommendations, at the time. Cheryl Blankenship recent genetic test results were disclosed to her, as were recommendations warranted by these results. These results and recommendations are discussed in more detail below.  CANCER HISTORY:  Oncology History Overview Note  Cancer Staging Malignant neoplasm of upper-outer quadrant of left breast in female, estrogen receptor positive ([place]) Staging form: Breast, AJCC 8th Edition - Clinical stage from 07/16/2020: Stage IB (cT2, cN1, cM0, G2, ER+, PR+, HER2+) - Signed by [name], MD on 07/21/2020    Malignant neoplasm of upper-outer quadrant of left breast in female, estrogen receptor positive ([place])  07/01/2020 Mammogram   IMPRESSION: 1. There is a 12 cm span of highly suspicious calcifications involving the upper outer and upper inner quadrants of the left breast. On ultrasound, within this area of calcifications, there are confluent masses at 1 o'clock spanning at least 4.5 cm. An additional suspicious mass is seen in the left breast at 2 o'clock.  -Ultrasound targeted to the left breast at 1 o'clock, 5 cm from the nipple demonstrates a broad area of hypoechoic irregular tissue/confluent masses spanning at least 4.5 cm. There is a separate oval hypoechoic mass with indistinct margins at 2 o'clock, 7 cm from the nipple measuring 0.9 x 0.4 x 0.9 cm. Ultrasound of the left axilla demonstrates 2 abnormally thickened lymph nodes with cortices measuring 6-7 mm. There is an additional lymph node with a borderline cortex of 4 mm.   2. There are 2 lymph nodes with thickened cortices in the left axilla, and an additional lymph node with a borderline  thickened cortex.   07/16/2020 Cancer Staging   Staging form: Breast, AJCC 8th Edition - Clinical stage from 07/16/2020: Stage IIA (cT3, cN1, cM0, G2, ER+, PR+, HER2+) - Signed by [name], MD on 08/05/2020   07/16/2020 Initial Biopsy   Diagnosis 1. 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 Breast, left, needle core biopsy, 1 o'clock - INVASIVE DUCTAL CARCINOMA - SEE COMMENT 3. Lymph node, biopsy - METASTATIC CARCINOMA INVOLVING A LYMPH NODE - SEE COMMENT Microscopic Comment 1. Based on the biopsy, the ductal carcinoma in situ has a comedo pattern, high nuclear grade and measures 0.7 cm in greatest linear extent. Prognostic markers (ER/PR) are pending and will be reported in an addendum. 2. Based on the biopsy, the carcinoma appears Nottingham grade 2 of 3 and measures 1.5 cm in greatest linear extent. Prognostic markers (ER/PR/ki-67/HER2) are pending and will be reported in an addendum. Dr. Saralyn Pilar reviewed the case and agrees with the above diagnosis. These results were called to [facility] on July 17, 2020. 3. Based on the biopsy, the carcinoma appears measures 1.0 cm in greatest linear extent. Prognostic markers (ER/PR/ki-67/HER2) are pending and will be reported in an addendum.   07/16/2020 Receptors her2   1. PROGNOSTIC INDICATORS Results: IMMUNOHISTOCHEMICAL AND MORPHOMETRIC ANALYSIS PERFORMED MANUALLY Estrogen Receptor: 30%, POSITIVE, STRONG STAINING INTENSITY Progesterone Receptor: 5%, POSITIVE, STRONG STAINING INTENSITY  2. PROGNOSTIC INDICATORS Results: IMMUNOHISTOCHEMICAL AND MORPHOMETRIC ANALYSIS PERFORMED MANUALLY The tumor cells are POSITIVE for Her2 (3+). Estrogen Receptor: 85%, POSITIVE, STRONG STAINING INTENSITY Progesterone Receptor: 60%, POSITIVE,  STRONG STAINING INTENSITY Proliferation Marker Ki67: 25%  3. PROGNOSTIC  INDICATORS Results: IMMUNOHISTOCHEMICAL AND MORPHOMETRIC ANALYSIS PERFORMED MANUALLY The tumor cells are POSITIVE for Her2 (3+). Estrogen Receptor: 95%, POSITIVE, STRONG STAINING INTENSITY Progesterone Receptor: 45%, POSITIVE, STRONG STAINING INTENSITY Proliferation Marker Ki67: 30%   07/20/2020 Initial Diagnosis   Malignant neoplasm of upper-outer quadrant of left breast in female, estrogen receptor positive ([place])   07/31/2020 Echocardiogram   Baseline Echo  IMPRESSIONS     1. Left ventricular ejection fraction, by estimation, is 65 to 70%. The  left ventricle has normal function. The left ventricle has no regional  wall motion abnormalities. Left ventricular diastolic parameters are  indeterminate. The average left  ventricular global longitudinal strain is -17.1 %.   2. Right ventricular systolic function is normal. The right ventricular  size is normal. There is normal pulmonary artery systolic pressure.   3. The mitral valve is normal in structure. Trivial mitral valve  regurgitation. No evidence of mitral stenosis.   4. The aortic valve is normal in structure. Aortic valve regurgitation is  not visualized. No aortic stenosis is present.    07/31/2020 Breast MRI   IMPRESSION: 1. Extensive non mass enhancement within the left breast extending from the nipple to the chest wall. Overall findings are most compatible with extensive malignancy involving the majority of the left breast. 2. Indeterminate enhancing mass within the upper inner right breast. 3. Three morphologically abnormal lymph nodes within the left axilla. One of these nodes has been biopsied compatible with metastatic adenopathy.     08/03/2020 Imaging   Bone Scan whole body  IMPRESSION: Increased radiotracer uptake in the mid to lower lumbar regions of uncertain etiology. Correlation with radiography to assess for potential arthropathy in these areas advised. Increased uptake in major joints is likely of  arthropathic etiology. Increased uptake in the mid face is likely due to paranasal sinus disease. Distribution of radiotracer uptake in bony structures elsewhere unremarkable.   08/04/2020 Procedure   PAC placement by Dr [name]   08/05/2020 - 10/28/2020 Neo-Adjuvant Chemotherapy   Neoadjuvant THP q3weeks for 4-6 cycles starting 08/05/20, followed by Herceptin or Kadcyla q3weeks to complete 1 year of treatment. Stopped THP after 1 dose due to poor toleration       -I switched her to Sebastian starting 08/25/20. Stopped after C2 due to poor toleration (10/28/20)   08/07/2020 Imaging   CT CAP  IMPRESSION: 2.7 cm mass in the upper outer left breast, likely corresponding to the patient's known primary breast neoplasm.   8 mm short axis left axillary node, suspicious for nodal metastasis in this clinical context.   No findings suspicious for distant metastasis.   Endometrium is mildly prominent, measuring 11 mm. Correlate for vaginal bleeding and consider pelvic ultrasound for further evaluation if clinically warranted.   08/11/2020 Pathology Results   Diagnosis Breast, right, needle core biopsy, right breast - PAPILLARY LESION - SEE COMMENT Microscopic Comment The biopsy consists of a papillary lesion with a focal area of adenosis. Immunohistochemistry (SMM, calponin and p63) shows retention of the myoepithelial cell layer in the focus of adenosis. 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 These results were called to [facility] on August 12, 2020.   10/27/2020 Imaging   CT Angio chest  IMPRESSION: Slightly suboptimal opacification of the main pulmonary artery, however no central or proximal segmental pulmonary embolism   Minimal bibasilar atelectasis   Aortic Atherosclerosis (ICD10-I70.0).   10/31/2020 Imaging  CT AP  IMPRESSION: 1. No acute finding by CT other than a large amount of stool and gas in the right colon and gas in  the transverse colon. No small bowel obstruction. 2. Previous cholecystectomy. 3. Aortic atherosclerosis. 4. Umbilical hernia containing only fat. 5. Small amount of fluid in the endometrial canal as seen previously.   Aortic Atherosclerosis (ICD10-I70.0).   12/04/2020 Imaging   MRI Breast IMPRESSION: 1. Evidence of a positive response to interval treatment. There has been a significant improvement of both the extent and strength of the enhancement throughout the outer LEFT breast when compared to the earlier MRI of 07/31/2020. 2. However, there remains scattered areas of mass and non-mass enhancement throughout the outer LEFT breast, involving the upper outer quadrant and lower outer quadrant, as detailed above. 3. The 3 morphologically abnormal lymph nodes in the LEFT axilla identified on previous MRI, 1 a biopsy-proven metastasis, are all slightly smaller indicating an additional positive response to interval treatment. 4. No evidence of malignancy within the RIGHT breast.   12/10/2020 - 09/01/2021 Chemotherapy   Patient is on Treatment Plan : BREAST Trastuzumab + Perjeta (05/06/21) q21d      12/30/2020 Surgery   A. BREAST, LEFT, MASTECTOMY:  - Focal residual invasive ductal carcinoma status post neoadjuvant  treatment.  - Residual ductal carcinoma in situ.  - Margins of resection are not involved.  - Biopsy clip (X2).   B. LYMPH NODES, LEFT #1, DISSECTION:  - Three lymph nodes, negative for carcinoma (0/3).  - Biopsy clip (X1).   C. LYMPH NODES, LEFT #2, DISSECTION:  - Two lymph nodes, negative for carcinoma (0/2).   PROGNOSTIC INDICATOR RESULTS:  - The tumor cells are POSITIVE for Her2 (3+).  - Estrogen Receptor:       POSITIVE, 90%, STRONG STAINING  - Progesterone Receptor:   POSITIVE, 50%, MODERATE STAINING    12/30/2020 Cancer Staging   Staging form: Breast, AJCC 8th Edition - Pathologic stage from 12/30/2020: No Stage Recommended (ypT1a, pN0, cM0, G2, ER+, PR+,  HER2+) - Signed by [name], MD on 01/20/2021 Stage prefix: Post-therapy Histologic grading system: 3 grade system Residual tumor (R): R0 - None   02/17/2021 - 04/08/2021 Radiation Therapy    Site Technique Total Dose (Gy) Dose per Fx (Gy) Completed Fx Beam Energies  Chest Wall, Left: CW_Lt 3D 50.4/50.4 1.8 28/28 10X  Chest Wall, Left: CW_Lt_SCLV 3D 50.4/50.4 1.8 28/28 6X, 15X  Chest Wall, Left: CW_Lt_Bst Electron 10/10 2 5/5 9E     Genetic Testing   [facility] CustomNext Panel was Negative. Report date is 03/08/2022.  The CustomNext-Cancer+RNAinsight panel offered by [facility] includes sequencing and rearrangement analysis for the following 47 genes:  APC, ATM, AXIN2, BARD1, BMPR1A, BRCA1, BRCA2, BRIP1, CDH1, CDK4, CDKN2A, CHEK2, CTNNA1, DICER1, EPCAM, GREM1, HOXB13, KIT, MEN1, MLH1, MSH2, MSH3, MSH6, MUTYH, NBN, NF1, NTHL1, PALB2, PDGFRA, PMS2, POLD1, POLE, PTEN, RAD50, RAD51C, RAD51D, SDHA, SDHB, SDHC, SDHD, SMAD4, SMARCA4, STK11, TP53, TSC1, TSC2, and VHL.  RNA data is routinely analyzed for use in variant interpretation for all genes.     FAMILY HISTORY:  We obtained a detailed, 4-generation family history.  Significant diagnoses are listed below:      Family History  Problem Relation Age of Onset   Heart attack Mother     Ovarian cancer Mother 46   Heart attack Sister     Diabetes Sister     Heart attack Brother     Colon cancer Maternal Grandmother  Bone cancer Cousin     Colon cancer Cousin     Cancer Niece          gynecological cancer         Cheryl Blankenship's mother was diagnosed with ovarian cancer at age 39, she is deceased. Her niece was diagnosed with a gynecological cancer in her 72s. She has a maternal cousin who had both colon and bone cancer, she is deceased. Her maternal grandmother was diagnosed with colon cancer at an unknown age (>50), she is deceased. Her maternal great aunt (grandmother's sister) was diagnosed with breast cancer at an unknown age, she is  deceased.   Cheryl Blankenship is unaware of previous family history of genetic testing for hereditary cancer risks. There is no reported Ashkenazi Jewish ancestry.   GENETIC TEST RESULTS:  The [facility] CustomNext Panel found no pathogenic mutations.  The CustomNext-Cancer+RNAinsight panel offered by [facility] includes sequencing and rearrangement analysis for the following 47 genes:  APC, ATM, AXIN2, BARD1, BMPR1A, BRCA1, BRCA2, BRIP1, CDH1, CDK4, CDKN2A, CHEK2, CTNNA1, DICER1, EPCAM, GREM1, HOXB13, KIT, MEN1, MLH1, MSH2, MSH3, MSH6, MUTYH, NBN, NF1, NTHL1, PALB2, PDGFRA, PMS2, POLD1, POLE, PTEN, RAD50, RAD51C, RAD51D, SDHA, SDHB, SDHC, SDHD, SMAD4, SMARCA4, STK11, TP53, TSC1, TSC2, and VHL.  RNA data is routinely analyzed for use in variant interpretation for all genes.  The test report has been scanned into EPIC and is located under the Molecular Pathology section of the Results Review tab.  A portion of the result report is included below for reference. Genetic testing reported out on 03/08/2022.       Even though a pathogenic variant was not identified, possible explanations for the cancer in the family may include: There may be no hereditary risk for cancer in the family. The cancers in Cheryl Blankenship and/or her family may be due to other genetic or environmental factors. There may be a gene mutation in one of these genes that current testing methods cannot detect, but that chance is small. There could be another gene that has not yet been discovered, or that we have not yet tested, that is responsible for the cancer diagnoses in the family.   Therefore, it is important to remain in touch with cancer genetics in the future so that we can continue to offer Cheryl Blankenship the most up to date genetic testing.   ADDITIONAL GENETIC TESTING:  We discussed with Cheryl Blankenship that her genetic testing was fairly extensive.  If there are genes identified to increase cancer risk that can be analyzed in the future,  we would be happy to discuss and coordinate this testing at that time.    CANCER SCREENING RECOMMENDATIONS:  Cheryl Blankenship test result is considered negative (normal).  This means that we have not identified a hereditary cause for her personal and family history of cancer at this time.   An individual's cancer risk and medical management are not determined by genetic test results alone. Overall cancer risk assessment incorporates additional factors, including personal medical history, family history, and any available genetic information that may result in a personalized plan for cancer prevention and surveillance. Therefore, it is recommended she continue to follow the cancer management and screening guidelines provided by her oncology and primary healthcare provider.  RECOMMENDATIONS FOR FAMILY MEMBERS:   Since she did not inherit a mutation in a cancer predisposition gene included on this panel, her children could not have inherited a mutation from her in one of these genes.  FOLLOW-UP:  Cancer  genetics is a rapidly advancing field and it is possible that new genetic tests will be appropriate for her and/or her family members in the future. We encouraged her to remain in contact with cancer genetics on an annual basis so we can update her personal and family histories and let her know of advances in cancer genetics that may benefit this family.   Our contact number was provided. Cheryl Blankenship questions were answered to her satisfaction, and she knows she is welcome to call us at anytime with additional questions or concerns.   Lucille Passy, MS, [facility] Genetic Counselor [email or web address] (P) [phone]

## 2022-03-29 ENCOUNTER — Encounter (HOSPITAL_COMMUNITY): Payer: Self-pay

## 2022-04-06 ENCOUNTER — Encounter: Payer: Self-pay | Admitting: Genetic Counselor

## 2022-04-11 ENCOUNTER — Other Ambulatory Visit: Payer: Self-pay

## 2022-04-11 ENCOUNTER — Encounter (HOSPITAL_BASED_OUTPATIENT_CLINIC_OR_DEPARTMENT_OTHER): Payer: Self-pay | Admitting: Surgery

## 2022-04-12 ENCOUNTER — Other Ambulatory Visit: Payer: Self-pay

## 2022-04-12 NOTE — Progress Notes (Signed)
Sent text reminding pt to come in for lab work and EKG today.

## 2022-04-13 ENCOUNTER — Encounter (HOSPITAL_BASED_OUTPATIENT_CLINIC_OR_DEPARTMENT_OTHER): Payer: Self-pay | Admitting: Surgery

## 2022-04-13 ENCOUNTER — Encounter (HOSPITAL_BASED_OUTPATIENT_CLINIC_OR_DEPARTMENT_OTHER)
Admission: RE | Disposition: A | Discharge status: Home / Self Care | Payer: Self-pay | Source: Home / Self Care | Attending: Surgery

## 2022-04-13 ENCOUNTER — Other Ambulatory Visit: Payer: Self-pay

## 2022-04-13 ENCOUNTER — Ambulatory Visit (HOSPITAL_BASED_OUTPATIENT_CLINIC_OR_DEPARTMENT_OTHER): Payer: Medicare HMO | Admitting: Certified Registered"

## 2022-04-13 ENCOUNTER — Ambulatory Visit (HOSPITAL_BASED_OUTPATIENT_CLINIC_OR_DEPARTMENT_OTHER)
Admission: RE | Admit: 2022-04-13 | Discharge: 2022-04-13 | Disposition: A | Discharge status: Home / Self Care | Payer: Medicare HMO | Attending: Surgery | Admitting: Surgery

## 2022-04-13 DIAGNOSIS — I110 Hypertensive heart disease with heart failure: Secondary | ICD-10-CM | POA: Insufficient documentation

## 2022-04-13 DIAGNOSIS — K219 Gastro-esophageal reflux disease without esophagitis: Secondary | ICD-10-CM | POA: Insufficient documentation

## 2022-04-13 DIAGNOSIS — Z853 Personal history of malignant neoplasm of breast: Secondary | ICD-10-CM | POA: Insufficient documentation

## 2022-04-13 DIAGNOSIS — Z9012 Acquired absence of left breast and nipple: Secondary | ICD-10-CM | POA: Diagnosis not present

## 2022-04-13 DIAGNOSIS — E119 Type 2 diabetes mellitus without complications: Secondary | ICD-10-CM

## 2022-04-13 DIAGNOSIS — I509 Heart failure, unspecified: Secondary | ICD-10-CM | POA: Diagnosis not present

## 2022-04-13 DIAGNOSIS — Z794 Long term (current) use of insulin: Secondary | ICD-10-CM

## 2022-04-13 HISTORY — PX: PORT-A-CATH REMOVAL: SHX5289

## 2022-04-13 LAB — BASIC METABOLIC PANEL
Anion gap: 7 (ref 5–15)
BUN: 25 mg/dL — ABNORMAL HIGH (ref 8–23)
CO2: 28 mmol/L (ref 22–32)
Calcium: 10.7 mg/dL — ABNORMAL HIGH (ref 8.9–10.3)
Chloride: 108 mmol/L (ref 98–111)
Creatinine, Ser: 1.27 mg/dL — ABNORMAL HIGH (ref 0.44–1.00)
GFR, Estimated: 46 mL/min — ABNORMAL LOW (ref >60)
Glucose, Bld: 127 mg/dL — ABNORMAL HIGH (ref 70–99)
Potassium: 3.5 mmol/L (ref 3.5–5.1)
Sodium: 143 mmol/L (ref 135–145)

## 2022-04-13 LAB — GLUCOSE, CAPILLARY: Glucose-Capillary: 108 mg/dL — ABNORMAL HIGH (ref 70–99)

## 2022-04-13 SURGERY — REMOVAL PORT-A-CATH
Anesthesia: General | Site: Chest | Laterality: Right

## 2022-04-13 MED ORDER — CHLORHEXIDINE GLUCONATE CLOTH 2 % EX PADS: 6.0000 | MEDICATED_PAD | Freq: Once | CUTANEOUS | Status: DC

## 2022-04-13 MED ORDER — PROPOFOL 500 MG/50ML IV EMUL
INTRAVENOUS | Status: DC | PRN
  Administered 2022-04-13: 75 ug/kg/min via INTRAVENOUS

## 2022-04-13 MED ORDER — LIDOCAINE 2% (20 MG/ML) 5 ML SYRINGE
INTRAMUSCULAR | Status: DC | PRN
  Administered 2022-04-13: 60 mg via INTRAVENOUS

## 2022-04-13 MED ORDER — BUPIVACAINE-EPINEPHRINE 0.25% -1:200000 IJ SOLN
INTRAMUSCULAR | Status: DC | PRN
  Administered 2022-04-13: 5 mL

## 2022-04-13 MED ORDER — FENTANYL CITRATE (PF) 100 MCG/2ML IJ SOLN
INTRAMUSCULAR | Status: DC | PRN
  Administered 2022-04-13: 50 ug via INTRAVENOUS

## 2022-04-13 MED ORDER — MIDAZOLAM HCL 5 MG/5ML IJ SOLN
INTRAMUSCULAR | Status: DC | PRN
  Administered 2022-04-13: 2 mg via INTRAVENOUS

## 2022-04-13 MED ORDER — HYDROMORPHONE HCL 1 MG/ML IJ SOLN: 0.2500 mg | INTRAMUSCULAR | Status: DC | PRN

## 2022-04-13 MED ORDER — FENTANYL CITRATE (PF) 100 MCG/2ML IJ SOLN
INTRAMUSCULAR | Status: AC
  Filled 2022-04-13: qty 2

## 2022-04-13 MED ORDER — MIDAZOLAM HCL 2 MG/2ML IJ SOLN
INTRAMUSCULAR | Status: AC
  Filled 2022-04-13: qty 2

## 2022-04-13 MED ORDER — ACETAMINOPHEN 500 MG PO TABS
ORAL_TABLET | ORAL | Status: AC
  Filled 2022-04-13: qty 2

## 2022-04-13 MED ORDER — ONDANSETRON HCL 4 MG/2ML IJ SOLN
INTRAMUSCULAR | Status: DC | PRN
  Administered 2022-04-13: 4 mg via INTRAVENOUS

## 2022-04-13 MED ORDER — ACETAMINOPHEN 500 MG PO TABS
1000.0000 mg | ORAL_TABLET | ORAL | Status: AC
  Administered 2022-04-13: 1000 mg via ORAL

## 2022-04-13 MED ORDER — LACTATED RINGERS IV SOLN: INTRAVENOUS | Status: DC

## 2022-04-13 SURGICAL SUPPLY — 39 items
APPLICATOR COTTON TIP 6 STRL (MISCELLANEOUS) IMPLANT
APPLICATOR COTTON TIP 6IN STRL (MISCELLANEOUS)
BENZOIN TINCTURE PRP APPL 2/3 (GAUZE/BANDAGES/DRESSINGS) ×2 IMPLANT
BLADE HEX COATED 2.75 (ELECTRODE) IMPLANT
BLADE SURG 15 STRL LF DISP TIS (BLADE) ×1 IMPLANT
BLADE SURG 15 STRL SS (BLADE) ×1
CANISTER SUCT 1200ML W/VALVE (MISCELLANEOUS) IMPLANT
CHLORAPREP W/TINT 26 (MISCELLANEOUS) ×2 IMPLANT
COVER BACK TABLE 60X90IN (DRAPES) ×2 IMPLANT
COVER MAYO STAND STRL (DRAPES) ×2 IMPLANT
DRAPE LAPAROTOMY 100X72 PEDS (DRAPES) ×2 IMPLANT
DRAPE UTILITY XL STRL (DRAPES) ×2 IMPLANT
DRSG TEGADERM 2-3/8X2-3/4 SM (GAUZE/BANDAGES/DRESSINGS) IMPLANT
DRSG TEGADERM 4X4.75 (GAUZE/BANDAGES/DRESSINGS) IMPLANT
ELECT REM PT RETURN 9FT ADLT (ELECTROSURGICAL) ×2
ELECTRODE REM PT RTRN 9FT ADLT (ELECTROSURGICAL) ×1 IMPLANT
GAUZE SPONGE 4X4 12PLY STRL LF (GAUZE/BANDAGES/DRESSINGS) IMPLANT
GLOVE BIO SURGEON STRL SZ7 (GLOVE) ×2 IMPLANT
GLOVE BIOGEL PI IND STRL 7.5 (GLOVE) ×1 IMPLANT
GLOVE BIOGEL PI INDICATOR 7.5 (GLOVE) ×1
GOWN STRL REUS W/ TWL LRG LVL3 (GOWN DISPOSABLE) ×2 IMPLANT
GOWN STRL REUS W/TWL LRG LVL3 (GOWN DISPOSABLE) ×2
NDL HYPO 25X1 1.5 SAFETY (NEEDLE) ×1 IMPLANT
NEEDLE HYPO 25X1 1.5 SAFETY (NEEDLE) ×2 IMPLANT
NS IRRIG 1000ML POUR BTL (IV SOLUTION) IMPLANT
PACK BASIN DAY SURGERY FS (CUSTOM PROCEDURE TRAY) ×2 IMPLANT
PENCIL SMOKE EVACUATOR (MISCELLANEOUS) ×2 IMPLANT
SPIKE FLUID TRANSFER (MISCELLANEOUS) IMPLANT
SPONGE GAUZE 2X2 8PLY STRL LF (GAUZE/BANDAGES/DRESSINGS) IMPLANT
SPONGE T-LAP 4X18 ~~LOC~~+RFID (SPONGE) ×2 IMPLANT
STRIP CLOSURE SKIN 1/2X4 (GAUZE/BANDAGES/DRESSINGS) ×2 IMPLANT
SUT MON AB 4-0 PC3 18 (SUTURE) ×2 IMPLANT
SUT VIC AB 3-0 SH 27 (SUTURE) ×1
SUT VIC AB 3-0 SH 27X BRD (SUTURE) ×1 IMPLANT
SYR BULB EAR ULCER 3OZ GRN STR (SYRINGE) IMPLANT
SYR CONTROL 10ML LL (SYRINGE) ×2 IMPLANT
TOWEL GREEN STERILE FF (TOWEL DISPOSABLE) ×4 IMPLANT
TUBE CONNECTING 20X1/4 (TUBING) IMPLANT
YANKAUER SUCT BULB TIP NO VENT (SUCTIONS) IMPLANT

## 2022-04-13 NOTE — Op Note (Signed)
Preop diagnosis: History of breast cancer Postop diagnosis: Same Procedure performed: Removal of Port-A-Cath Surgeon:Helton Oleson K Myleka Moncure Anesthesia: Local MAC Indications: This is a 71 year old female who is status post left mastectomy and chemotherapy for breast cancer.  She has completed her course of chemotherapy.  She presents now for port removal.  Description of procedure: The patient is brought to the operating room placed in supine position on the operative table.  After an adequate level of general anesthesia was obtained, the patient's right chest was prepped with ChloraPrep and draped sterile fashion.  A timeout was taken to ensure the proper patient and proper procedure.  We infiltrated the area over the port with local anesthetic.  I opened the incision.  We dissected down to the port with cautery.  The 2 stay sutures were removed.  The port was removed.  Direct pressure was held against the internal jugular for several minutes until we were satisfied with hemostasis.  We then closed the wound with a deep layer of 3-0 Vicryl and a subcuticular layer 4 Monocryl.  Benzoin and Steri-Strips were applied.  A clean dressing is applied.  The patient was awakened and brought to the recovery room in stable condition.  All sponge, instrument, and needle counts are correct.  Imogene Burn. [name], MD, [facility]  General Surgery   04/13/2022 12:19 PM

## 2022-04-13 NOTE — Transfer of Care (Signed)
Immediate Anesthesia Transfer of Care Note  Patient: Cheryl Blankenship  Procedure(s) Performed: REMOVAL PORT-A-CATH (Right: Chest)  Patient Location: PACU  Anesthesia Type:MAC  Level of Consciousness: drowsy and patient cooperative  Airway & Oxygen Therapy: Patient Spontanous Breathing and Patient connected to face mask oxygen  Post-op Assessment: Report given to RN and Post -op Vital signs reviewed and stable  Post vital signs: Reviewed and stable  Last Vitals:  Vitals Value Taken Time  BP    Temp    Pulse 58 04/13/22 1215  Resp 0 04/13/22 1215  SpO2 97 % 04/13/22 1215  Vitals shown include unvalidated device data.  Last Pain:  Vitals:   04/13/22 1023  TempSrc: Oral  PainSc: 0-No pain      Patients Stated Pain Goal: 7 (73/53/29 [id number])  Complications: No notable events documented.

## 2022-04-13 NOTE — H&P (Signed)
Cheryl Blankenship is an 71 y.o. female.   Chief Complaint: History of breast cancer HPI: 71 year old s/p left mastectomy with ALND in 2022 has now completed chemotherapy.  She presents now for port removal.    Past Medical History:  Diagnosis Date   Anxiety    Breast cancer ([place])    Cancer ([place])    CHF (congestive heart failure) (H[facility])    Congestive heart failure (CHF) (H[facility])    Depression    Diabetes mellitus without complication ([place])    type 2   Diabetes mellitus without complication (H[facility])    GERD (gastroesophageal reflux disease)    High cholesterol    History of kidney stones    Hypertension    PONV (postoperative nausea and vomiting)    Stroke [facility])     Past Surgical History:  Procedure Laterality Date   APPENDECTOMY     BREAST SURGERY     CHOLECYSTECTOMY     COLONOSCOPY     ESOPHAGOGASTRODUODENOSCOPY (EGD) WITH PROPOFOL N/A 11/01/2020   Procedure: ESOPHAGOGASTRODUODENOSCOPY (EGD) WITH PROPOFOL;  Surgeon: Arta Silence, MD;  Location: [facility];  Service: Endoscopy;  Laterality: N/A;   EYE SURGERY     cataracts   MASTECTOMY     MASTECTOMY WITH AXILLARY LYMPH NODE DISSECTION Left 12/30/2020   Procedure: LEFT MASTECTOMY WITH TARGETED AXILLARY LYMPH NODE DISSECTION;  Surgeon: [name], MD;  Location: [place];  Service: General;  Laterality: Left;   MULTIPLE TOOTH EXTRACTIONS     PORTACATH PLACEMENT N/A 08/04/2020   Procedure: INSERTION PORT-A-CATH WITH ULTRASOUND GUIDANCE;  Surgeon: [name], MD;  Location: [place];  Service: General;  Laterality: N/A;   STOMACH SURGERY      Family History  Problem Relation Age of Onset   Heart attack Mother    Ovarian cancer Mother 4   Heart attack Sister    Diabetes Sister    Heart attack Brother    Colon cancer Maternal Grandmother    Bone cancer Cousin    Colon cancer Cousin    Cancer Niece        gynecological cancer   Social History:  reports that she has never smoked. She has never used smokeless tobacco.  She reports that she does not drink alcohol and does not use drugs.  Allergies:  Allergies  Allergen Reactions   Cocos Nucifera     Hives and swells mouth   Hydrochlorothiazide Swelling    tongue   Lisinopril Swelling    Angioedema. Tolerates Losartan.   Coconut (Cocos Nucifera) Hives   Cocos Nucifera    Hydrochlorothiazide Swelling   Other     Pt is allergic to a pain medication. Unsure of the name   Pork-Derived Products     For religious reasons   Lisinopril Rash    Medications Prior to Admission  Medication Sig Dispense Refill   acetaminophen (TYLENOL) 500 MG tablet Take 1 tablet (500 mg total) by mouth every 6 (six) hours as needed for moderate pain, headache or mild pain. 20 tablet 0   albuterol (VENTOLIN HFA) 108 (90 Base) MCG/ACT inhaler Inhale 1-2 puffs into the lungs every 4 (four) hours as needed for wheezing or shortness of breath. 8 g 0   ALPRAZolam (XANAX) 0.25 MG tablet Take 1-2 tablets (0.25-0.5 mg total) by mouth 3 (three) times daily as needed for anxiety or sleep. 30 tablet 0   amLODipine (NORVASC) 10 MG tablet Take 10 mg by mouth daily.     aspirin 81  MG chewable tablet Chew 81 mg by mouth daily.      bisacodyl (DULCOLAX) 5 MG EC tablet Take 1 tablet (5 mg total) by mouth daily as needed for moderate constipation. 30 tablet 0   carvedilol (COREG) 25 MG tablet Take 25 mg by mouth 2 (two) times daily with a meal.     cholecalciferol (VITAMIN D) 25 MCG (1000 UNIT) tablet Take 1,000 Units by mouth at bedtime.     cycloSPORINE (RESTASIS) 0.05 % ophthalmic emulsion Place 1 drop into both eyes 2 (two) times daily.     DULoxetine (CYMBALTA) 30 MG capsule Take 1 tablet by mouth daily. 30 mg daily     exemestane (AROMASIN) 25 MG tablet Take 1 tablet (25 mg total) by mouth daily after breakfast. 90 tablet 1   folic acid (FOLVITE) 1 MG tablet Take 1 mg by mouth daily.     furosemide (LASIX) 20 MG tablet Take 1 tablet (20 mg total) by mouth daily as needed (for swelling or  weight gain). 90 tablet 3   gabapentin (NEURONTIN) 400 MG capsule Take 400 mg by mouth daily.     glipiZIDE (GLUCOTROL XL) 5 MG 24 hr tablet Take 1 tablet by mouth daily with breakfast.     Insulin Aspart (NOVOLOG FLEXPEN [place]) Inject 0-10 Units into the skin with breakfast, with lunch, and with evening meal. 0-150 = 0 units 150-199 = 2 units 200-249 = 4 units 250-299 = 6 units 300-349 = 8 units Greater than 350 = 10 units     insulin glargine (LANTUS) 100 UNIT/ML injection Inject 0.3 mLs (30 Units total) into the skin 2 (two) times daily. 10 mL 0   JARDIANCE 10 MG TABS tablet Take by mouth.     lactulose (CHRONULAC) 10 GM/15ML solution Take 30 g by mouth daily.     losartan (COZAAR) 100 MG tablet Take 1 tablet (100 mg total) by mouth daily. 90 tablet 3   metFORMIN (GLUCOPHAGE) 500 MG tablet Take 500 mg by mouth 2 (two) times daily with a meal.      methocarbamol (ROBAXIN) 500 MG tablet Take 500 mg by mouth every 8 (eight) hours as needed for muscle spasms.     Omega-3 Fatty Acids (FISH OIL) 1000 MG CAPS Take 1,000 mg by mouth daily.     omeprazole (PRILOSEC) 20 MG capsule Take 1 capsule (20 mg total) by mouth daily. 30 capsule 11   ondansetron (ZOFRAN-ODT) 4 MG disintegrating tablet Take 4 mg by mouth 2 (two) times daily as needed for nausea or vomiting.     potassium chloride (MICRO-K) 10 MEQ CR capsule Take 10 mEq by mouth daily.     pregabalin (LYRICA) 75 MG capsule TAKE 1 CAPSULE BY MOUTH 2 TIMES DAILY 60 capsule 3   PROMETHEGAN 25 MG suppository place 1 SUPPOSITORY rectally EVERY 8 HOURS AS NEEDED FOR NAUSEA AND VOMITING 30 suppository 1   RESTASIS 0.05 % ophthalmic emulsion Place 1 drop into both eyes 2 (two) times daily as needed (dry eyes).     rOPINIRole (REQUIP) 0.25 MG tablet TAKE 1 TABLET 3 TIMES DAILY x7 DAYS, THEN TAKE 2 TABLETS 3 TIMES DAILY x7 DAYS, THEN TAKE 3 TABLETS 3 TIMES DAILY x7 DAYS, THEN TAKE 4 TABLETS 3 TIMES DAILY 90 tablet 1   rosuvastatin (CRESTOR) 5 MG tablet Take  5 mg by mouth at bedtime.     spironolactone (ALDACTONE) 25 MG tablet Take 1 tablet (25 mg total) by mouth daily. 90 tablet 3  vitamin B-12 (CYANOCOBALAMIN) 1000 MCG tablet Take 1,000 mcg by mouth daily.     zolpidem (AMBIEN) 5 MG tablet Take 1 tablet (5 mg total) by mouth at bedtime as needed for sleep. 20 tablet 0   Accu-Chek FastClix Lancets MISC 1 each by Other route in the morning, at noon, and at bedtime.      No results found for this or any previous visit (from the past 48 hour(s)). No results found.  Review of Systems Constitutional:  Negative for appetite change, chills, fatigue, fever and unexpected weight change.  HENT:   Negative for hearing loss, lump/mass and trouble swallowing.   Eyes:  Negative for eye problems and icterus.  Respiratory:  Negative for chest tightness, cough and shortness of breath.   Cardiovascular:  Negative for chest pain, leg swelling and palpitations.  Gastrointestinal:  Negative for abdominal distention, abdominal pain, constipation, diarrhea, nausea and vomiting.  Endocrine: Negative for hot flashes.  Genitourinary:  Negative for difficulty urinating.   Musculoskeletal:  Negative for arthralgias.  Skin:  Negative for itching and rash.  Neurological:  Negative for dizziness, extremity weakness, headaches and numbness.  Hematological:  Negative for adenopathy. Does not bruise/bleed easily.  Psychiatric/Behavioral:  Negative for depression. The patient is not nervous/anxious.   Breast: Denies any new nodularity, masses, tenderness, nipple changes, or nipple discharge.  Blood pressure (!) 152/83, pulse 70, temperature 99.4 F (37.4 C), temperature source Oral, resp. rate 18, height '5\' 3"'$  (1.6 m), weight 90.9 kg, SpO2 100 %. Physical Exam  WDWn in NAD Right chest port site - c/d/I Left mastectomy site well-healed   Assessment/Plan History of breast cancer  Port removal.  The surgical procedure has been discussed with the patient.  Potential  risks, benefits, alternative treatments, and expected outcomes have been explained.  All of the patient's questions at this time have been answered.  The likelihood of reaching the patient's treatment goal is good.  The patient understand the proposed surgical procedure and wishes to proceed.   [name], MD 04/13/2022, 10:35 AM

## 2022-04-13 NOTE — Anesthesia Preprocedure Evaluation (Addendum)
Anesthesia Evaluation  Patient identified by MRN, date of birth, ID band  Reviewed: Allergy & Precautions, NPO status , Patient's Chart, lab work & pertinent test results  History of Anesthesia Complications (+) PONV and history of anesthetic complications  Airway Mallampati: II  TM Distance: >3 FB     Dental   Pulmonary pneumonia,    breath sounds clear to auscultation       Cardiovascular hypertension, +CHF   Rhythm:Regular Rate:Normal     Neuro/Psych PSYCHIATRIC DISORDERS  Neuromuscular disease CVA    GI/Hepatic Neg liver ROS, GERD  ,  Endo/Other  diabetes  Renal/GU negative Renal ROS     Musculoskeletal  (+) Arthritis ,   Abdominal   Peds  Hematology   Anesthesia Other Findings   Reproductive/Obstetrics                            Anesthesia Physical Anesthesia Plan  ASA: 3  Anesthesia Plan: MAC   Post-op Pain Management:    Induction:   PONV Risk Score and Plan: 3 and Ondansetron, Dexamethasone and Midazolam  Airway Management Planned: Simple Face Mask and Nasal Cannula  Additional Equipment:   Intra-op Plan:   Post-operative Plan:   Informed Consent: I have reviewed the patients History and Physical, chart, labs and discussed the procedure including the risks, benefits and alternatives for the proposed anesthesia with the patient or authorized representative who has indicated his/her understanding and acceptance.     Dental advisory given  Plan Discussed with: CRNA and Anesthesiologist  Anesthesia Plan Comments:        Anesthesia Quick Evaluation

## 2022-04-13 NOTE — Anesthesia Procedure Notes (Signed)
Procedure Name: MAC Date/Time: 04/13/2022 12:03 PM  Performed by: Signe Colt, CRNAPre-anesthesia Checklist: Patient identified, Emergency Drugs available, Suction available, Patient being monitored and Timeout performed Oxygen Delivery Method: Simple face mask

## 2022-04-13 NOTE — Discharge Instructions (Addendum)
    PORT-A-CATH: POST OP INSTRUCTIONS  Always review your discharge instruction sheet given to you by the facility where your surgery was performed.   You make take acetaminophen (Tylenol) or ibuprofen (Advil) as needed.  Take your usually prescribed medications unless otherwise directed. You should follow a light diet for the remainder of the day after your procedure. Most patients will experience some mild swelling and/or bruising in the area of the incision. It may take several days to resolve. Unless discharge instructions indicate otherwise, you may remove your bandages 48 hours after surgery, and you may shower at that time. You may have steri-strips (small white skin tapes) in place directly over the incision.  These strips should be left on the skin for 7-10 days.   ACTIVITIES:  Limit activity involving your arms for the next 72 hours. Do no strenuous exercise or activity for 1 week. You may drive when you can comfortably wear a seatbelt, and you can maneuver your car. 10.You may need to see your doctor in the office for a follow-up appointment.  Please       check with your doctor.  .  WHEN TO CALL YOUR DOCTOR [phone]): Fever over 101.0 Chills Continued bleeding from incision Increased redness and tenderness at the site Shortness of breath, difficulty breathing   The clinic staff is available to answer your questions during regular business hours. Please don't hesitate to call and ask to speak to one of the nurses or medical assistants for clinical concerns. If you have a medical emergency, go to the nearest emergency room or call 911.  A surgeon from [facility] is always on call at the hospital.    No Tylenol before 5:30pm today.   Post Anesthesia Home Care Instructions  Activity: Get plenty of rest for the remainder of the day. A responsible individual must stay with you for 24 hours following the procedure.  For the next 24 hours, DO NOT: -Drive a  car -Paediatric nurse -Drink alcoholic beverages -Take any medication unless instructed by your physician -Make any legal decisions or sign important papers.  Meals: Start with liquid foods such as gelatin or soup. Progress to regular foods as tolerated. Avoid greasy, spicy, heavy foods. If nausea and/or vomiting occur, drink only clear liquids until the nausea and/or vomiting subsides. Call your physician if vomiting continues.  Special Instructions/Symptoms: Your throat may feel dry or sore from the anesthesia or the breathing tube placed in your throat during surgery. If this causes discomfort, gargle with warm salt water. The discomfort should disappear within 24 hours.  If you had a scopolamine patch placed behind your ear for the management of post- operative nausea and/or vomiting:  1. The medication in the patch is effective for 72 hours, after which it should be removed.  Wrap patch in a tissue and discard in the trash. Wash hands thoroughly with soap and water. 2. You may remove the patch earlier than 72 hours if you experience unpleasant side effects which may include dry mouth, dizziness or visual disturbances. 3. Avoid touching the patch. Wash your hands with soap and water after contact with the patch.

## 2022-04-13 NOTE — Anesthesia Postprocedure Evaluation (Signed)
Anesthesia Post Note  Patient: Shellye Zandi  Procedure(s) Performed: REMOVAL PORT-A-CATH (Right: Chest)     Patient location during evaluation: PACU Anesthesia Type: General Level of consciousness: awake Pain management: pain level controlled Respiratory status: spontaneous breathing Cardiovascular status: stable Postop Assessment: no apparent nausea or vomiting Anesthetic complications: no   No notable events documented.  Last Vitals:  Vitals:   04/13/22 1245 04/13/22 1310  BP: (!) 156/86 (!) 141/86  Pulse: 61 63  Resp: 12 16  Temp:  (!) 36.2 C  SpO2: 95% 98%    Last Pain:  Vitals:   04/13/22 1245  TempSrc:   PainSc: 0-No pain                 Bethanee Redondo

## 2022-04-14 ENCOUNTER — Encounter (HOSPITAL_BASED_OUTPATIENT_CLINIC_OR_DEPARTMENT_OTHER): Payer: Self-pay | Admitting: Surgery

## 2022-05-04 ENCOUNTER — Inpatient Hospital Stay: Payer: Medicare HMO | Admitting: Hematology

## 2022-05-04 ENCOUNTER — Inpatient Hospital Stay: Payer: Medicare HMO | Attending: Nurse Practitioner

## 2022-05-30 ENCOUNTER — Other Ambulatory Visit: Payer: Self-pay

## 2022-05-30 ENCOUNTER — Emergency Department (HOSPITAL_COMMUNITY): Payer: Medicare HMO

## 2022-05-30 ENCOUNTER — Emergency Department (HOSPITAL_COMMUNITY)
Admission: EM | Admit: 2022-05-30 | Discharge: 2022-05-30 | Disposition: A | Discharge status: Home / Self Care | Payer: Medicare HMO | Attending: Emergency Medicine | Admitting: Emergency Medicine

## 2022-05-30 ENCOUNTER — Encounter (HOSPITAL_COMMUNITY): Payer: Self-pay

## 2022-05-30 DIAGNOSIS — W182XXA Fall in (into) shower or empty bathtub, initial encounter: Secondary | ICD-10-CM | POA: Insufficient documentation

## 2022-05-30 DIAGNOSIS — I509 Heart failure, unspecified: Secondary | ICD-10-CM | POA: Diagnosis not present

## 2022-05-30 DIAGNOSIS — Z794 Long term (current) use of insulin: Secondary | ICD-10-CM | POA: Insufficient documentation

## 2022-05-30 DIAGNOSIS — R519 Headache, unspecified: Secondary | ICD-10-CM | POA: Insufficient documentation

## 2022-05-30 DIAGNOSIS — Z79899 Other long term (current) drug therapy: Secondary | ICD-10-CM | POA: Diagnosis not present

## 2022-05-30 DIAGNOSIS — N3001 Acute cystitis with hematuria: Secondary | ICD-10-CM | POA: Insufficient documentation

## 2022-05-30 DIAGNOSIS — I110 Hypertensive heart disease with heart failure: Secondary | ICD-10-CM | POA: Insufficient documentation

## 2022-05-30 DIAGNOSIS — Z853 Personal history of malignant neoplasm of breast: Secondary | ICD-10-CM | POA: Diagnosis not present

## 2022-05-30 DIAGNOSIS — Z8673 Personal history of transient ischemic attack (TIA), and cerebral infarction without residual deficits: Secondary | ICD-10-CM | POA: Diagnosis not present

## 2022-05-30 DIAGNOSIS — M542 Cervicalgia: Secondary | ICD-10-CM | POA: Diagnosis not present

## 2022-05-30 DIAGNOSIS — Z7982 Long term (current) use of aspirin: Secondary | ICD-10-CM | POA: Insufficient documentation

## 2022-05-30 DIAGNOSIS — M546 Pain in thoracic spine: Secondary | ICD-10-CM | POA: Diagnosis not present

## 2022-05-30 DIAGNOSIS — Z7984 Long term (current) use of oral hypoglycemic drugs: Secondary | ICD-10-CM | POA: Insufficient documentation

## 2022-05-30 DIAGNOSIS — Y92002 Bathroom of unspecified non-institutional (private) residence single-family (private) house as the place of occurrence of the external cause: Secondary | ICD-10-CM | POA: Diagnosis not present

## 2022-05-30 DIAGNOSIS — S298XXA Other specified injuries of thorax, initial encounter: Secondary | ICD-10-CM

## 2022-05-30 DIAGNOSIS — S299XXA Unspecified injury of thorax, initial encounter: Secondary | ICD-10-CM | POA: Diagnosis present

## 2022-05-30 DIAGNOSIS — E119 Type 2 diabetes mellitus without complications: Secondary | ICD-10-CM | POA: Insufficient documentation

## 2022-05-30 DIAGNOSIS — W19XXXA Unspecified fall, initial encounter: Secondary | ICD-10-CM

## 2022-05-30 LAB — COMPREHENSIVE METABOLIC PANEL
ALT: 17 U/L (ref 0–44)
AST: 13 U/L — ABNORMAL LOW (ref 15–41)
Albumin: 3.5 g/dL (ref 3.5–5.0)
Alkaline Phosphatase: 74 U/L (ref 38–126)
Anion gap: 9 (ref 5–15)
BUN: 25 mg/dL — ABNORMAL HIGH (ref 8–23)
CO2: 27 mmol/L (ref 22–32)
Calcium: 10.4 mg/dL — ABNORMAL HIGH (ref 8.9–10.3)
Chloride: 101 mmol/L (ref 98–111)
Creatinine, Ser: 1.19 mg/dL — ABNORMAL HIGH (ref 0.44–1.00)
GFR, Estimated: 50 mL/min — ABNORMAL LOW (ref >60)
Glucose, Bld: 373 mg/dL — ABNORMAL HIGH (ref 70–99)
Potassium: 4.2 mmol/L (ref 3.5–5.1)
Sodium: 137 mmol/L (ref 135–145)
Total Bilirubin: 0.8 mg/dL (ref 0.3–1.2)
Total Protein: 6.7 g/dL (ref 6.5–8.1)

## 2022-05-30 LAB — CBC WITH DIFFERENTIAL/PLATELET
Abs Immature Granulocytes: 0.01 10*3/uL (ref 0.00–0.07)
Basophils Absolute: 0 10*3/uL (ref 0.0–0.1)
Basophils Relative: 1 %
Eosinophils Absolute: 0.1 10*3/uL (ref 0.0–0.5)
Eosinophils Relative: 3 %
HCT: 39.1 % (ref 36.0–46.0)
Hemoglobin: 12.6 g/dL (ref 12.0–15.0)
Immature Granulocytes: 0 %
Lymphocytes Relative: 28 %
Lymphs Abs: 1.3 10*3/uL (ref 0.7–4.0)
MCH: 28.5 pg (ref 26.0–34.0)
MCHC: 32.2 g/dL (ref 30.0–36.0)
MCV: 88.5 fL (ref 80.0–100.0)
Monocytes Absolute: 0.3 10*3/uL (ref 0.1–1.0)
Monocytes Relative: 7 %
Neutro Abs: 3 10*3/uL (ref 1.7–7.7)
Neutrophils Relative %: 61 %
Platelets: 257 10*3/uL (ref 150–400)
RBC: 4.42 MIL/uL (ref 3.87–5.11)
RDW: 12.2 % (ref 11.5–15.5)
WBC: 4.8 10*3/uL (ref 4.0–10.5)
nRBC: 0 % (ref 0.0–0.2)

## 2022-05-30 LAB — URINALYSIS, ROUTINE W REFLEX MICROSCOPIC
Bilirubin Urine: NEGATIVE
Glucose, UA: >=500 mg/dL — AB
Hgb urine dipstick: NEGATIVE
Ketones, ur: NEGATIVE mg/dL
Nitrite: POSITIVE — AB
Protein, ur: NEGATIVE mg/dL
Specific Gravity, Urine: 1.026 (ref 1.005–1.030)
pH: 5 (ref 5.0–8.0)

## 2022-05-30 LAB — CBG MONITORING, ED
Glucose-Capillary: 325 mg/dL — ABNORMAL HIGH (ref 70–99)
Glucose-Capillary: 277 mg/dL — ABNORMAL HIGH (ref 70–99)

## 2022-05-30 MED ORDER — CEPHALEXIN 500 MG PO CAPS: 500.0000 mg | ORAL_CAPSULE | Freq: Four times a day (QID) | ORAL | 0 refills | Status: AC

## 2022-05-30 MED ORDER — METHOCARBAMOL 500 MG PO TABS: 500.0000 mg | ORAL_TABLET | Freq: Three times a day (TID) | ORAL | 0 refills | Status: AC | PRN

## 2022-05-30 MED ORDER — INSULIN ASPART 100 UNIT/ML IJ SOLN: 6.0000 [IU] | Freq: Once | INTRAMUSCULAR | Status: DC

## 2022-05-30 NOTE — ED Provider Notes (Signed)
[facility] EMERGENCY DEPARTMENT Provider Note   CSN: [id number] Arrival date & time: 05/30/22  1038     History  No chief complaint on file.   Cheryl Blankenship is a 71 y.o. female.  HPI Patient presents after a fall.  Fall was on Friday with today being Monday.  Slipped while in the bathroom.  Pain in right upper back.  Worse with certain movements.  States difficulty breathing due to it.  No fevers.  Not on blood thinners did not hit head.  However did have some hematuria today.  No real abdominal pain.  No flank pain.  Patient did hit her head when she fell.   Past Medical History:  Diagnosis Date   Anxiety    Breast cancer ([place])    Cancer ([place])    CHF (congestive heart failure) (H[facility])    Congestive heart failure (CHF) (HCC)    Depression    Diabetes mellitus without complication ([place])    type 2   Diabetes mellitus without complication (H[facility])    GERD (gastroesophageal reflux disease)    High cholesterol    History of kidney stones    Hypertension    PONV (postoperative nausea and vomiting)    Stroke ([place])     Home Medications Prior to Admission medications   Medication Sig Start Date End Date Taking? Authorizing Provider  cephALEXin (KEFLEX) 500 MG capsule Take 1 capsule (500 mg total) by mouth 4 (four) times daily. 05/30/22  Yes Davonna Belling, MD  Accu-Chek FastClix Lancets MISC 1 each by Other route in the morning, at noon, and at bedtime. 08/14/20   [provider]  acetaminophen (TYLENOL) 500 MG tablet Take 1 tablet (500 mg total) by mouth every 6 (six) hours as needed for moderate pain, headache or mild pain. 08/09/21   Thurnell Lose, MD  albuterol (VENTOLIN HFA) 108 (90 Base) MCG/ACT inhaler Inhale 1-2 puffs into the lungs every 4 (four) hours as needed for wheezing or shortness of breath. 09/01/20   Alla Feeling, NP  ALPRAZolam Duanne Moron) 0.25 MG tablet Take 1-2 tablets (0.25-0.5 mg total) by mouth 3 (three) times daily as needed  for anxiety or sleep. 02/26/21   [name], MD  amLODipine (NORVASC) 10 MG tablet Take 10 mg by mouth daily.    [provider]  aspirin 81 MG chewable tablet Chew 81 mg by mouth daily.     [provider]  bisacodyl (DULCOLAX) 5 MG EC tablet Take 1 tablet (5 mg total) by mouth daily as needed for moderate constipation. 11/04/20   Antonieta Pert, MD  carvedilol (COREG) 25 MG tablet Take 25 mg by mouth 2 (two) times daily with a meal.    [provider]  cholecalciferol (VITAMIN D) 25 MCG (1000 UNIT) tablet Take 1,000 Units by mouth at bedtime.    [provider]  cycloSPORINE (RESTASIS) 0.05 % ophthalmic emulsion Place 1 drop into both eyes 2 (two) times daily.    [provider]  DULoxetine (CYMBALTA) 30 MG capsule Take 1 tablet by mouth daily. 30 mg daily 05/26/21   [provider]  exemestane (AROMASIN) 25 MG tablet Take 1 tablet (25 mg total) by mouth daily after breakfast. 10/26/21   [name], MD  folic acid (FOLVITE) 1 MG tablet Take 1 mg by mouth daily. 07/14/20   [provider]  furosemide (LASIX) 20 MG tablet Take 1 tablet (20 mg total) by mouth daily as needed (for swelling or weight  gain). 06/14/21   [name], [name], MD  gabapentin (NEURONTIN) 400 MG capsule Take 400 mg by mouth daily.    [provider]  glipiZIDE (GLUCOTROL XL) 5 MG 24 hr tablet Take 1 tablet by mouth daily with breakfast. 11/09/20   [provider]  Insulin Aspart (NOVOLOG FLEXPEN [place]) Inject 0-10 Units into the skin with breakfast, with lunch, and with evening meal. 0-150 = 0 units 150-199 = 2 units 200-249 = 4 units 250-299 = 6 units 300-349 = 8 units Greater than 350 = 10 units    [provider]  insulin glargine (LANTUS) 100 UNIT/ML injection Inject 0.3 mLs (30 Units total) into the skin 2 (two) times daily. 08/09/21   Thurnell Lose, MD  JARDIANCE 10 MG TABS tablet Take by mouth. 01/03/22   [provider]   lactulose (CHRONULAC) 10 GM/15ML solution Take 30 g by mouth daily.    [provider]  losartan (COZAAR) 100 MG tablet Take 1 tablet (100 mg total) by mouth daily. 06/14/21   Geralynn Rile, MD  metFORMIN (GLUCOPHAGE) 500 MG tablet Take 500 mg by mouth 2 (two) times daily with a meal.  06/25/20   [provider]  methocarbamol (ROBAXIN) 500 MG tablet Take 1 tablet (500 mg total) by mouth every 8 (eight) hours as needed for muscle spasms. 05/30/22   Davonna Belling, MD  Omega-3 Fatty Acids (FISH OIL) 1000 MG CAPS Take 1,000 mg by mouth daily.    [provider]  omeprazole (PRILOSEC) 20 MG capsule Take 1 capsule (20 mg total) by mouth daily. 10/09/20   Geralynn Rile, MD  ondansetron (ZOFRAN-ODT) 4 MG disintegrating tablet Take 4 mg by mouth 2 (two) times daily as needed for nausea or vomiting.    [provider]  potassium chloride (MICRO-K) 10 MEQ CR capsule Take 10 mEq by mouth daily.    [provider]  pregabalin (LYRICA) 75 MG capsule TAKE 1 CAPSULE BY MOUTH 2 TIMES DAILY 02/23/22   Vaslow, Acey Lav, MD  PROMETHEGAN 25 MG suppository place 1 SUPPOSITORY rectally EVERY 8 HOURS AS NEEDED FOR NAUSEA AND VOMITING 06/09/21   [name], MD  RESTASIS 0.05 % ophthalmic emulsion Place 1 drop into both eyes 2 (two) times daily as needed (dry eyes). 07/13/20   [provider]  rOPINIRole (REQUIP) 0.25 MG tablet TAKE 1 TABLET 3 TIMES DAILY x7 DAYS, THEN TAKE 2 TABLETS 3 TIMES DAILY x7 DAYS, THEN TAKE 3 TABLETS 3 TIMES DAILY x7 DAYS, THEN TAKE 4 TABLETS 3 TIMES DAILY 07/20/21   Vaslow, Acey Lav, MD  rosuvastatin (CRESTOR) 5 MG tablet Take 5 mg by mouth at bedtime.    [provider]  spironolactone (ALDACTONE) 25 MG tablet Take 1 tablet (25 mg total) by mouth daily. 06/14/21   O'NealCassie Freer, MD  vitamin B-12 (CYANOCOBALAMIN) 1000 MCG tablet Take 1,000 mcg by mouth daily.    [provider]  zolpidem (AMBIEN) 5 MG  tablet Take 1 tablet (5 mg total) by mouth at bedtime as needed for sleep. 10/28/20   [name], MD      Allergies    Cocos nucifera, Hydrochlorothiazide, Lisinopril, Coconut (cocos nucifera), Cocos nucifera, Hydrochlorothiazide, Other, Pork-derived products, and Lisinopril    Review of Systems   Review of Systems  Physical Exam Updated Vital Signs BP 130/75 (BP Location: Right Arm)   Pulse 70   Temp 97.8 F (36.6 C) (Oral)   Resp 16   Ht 5'  3" (1.6 m)   Wt 90.7 kg   SpO2 98%   BMI 35.43 kg/m  Physical Exam Vitals and nursing note reviewed.  HENT:     Head: Atraumatic.  Cardiovascular:     Rate and Rhythm: Regular rhythm.  Pulmonary:     Breath sounds: Normal breath sounds.     Comments: Some tenderness right superior posterior chest wall near the level of the scapula.  No crepitance deformity. Chest:     Chest wall: Tenderness present.  Abdominal:     Tenderness: There is no abdominal tenderness.  Musculoskeletal:     Cervical back: Neck supple. No tenderness.     Comments: Right-sided neck musculature tenderness.  No clavicular tenderness.  Posterior chest wall tenderness.  Skin:    General: Skin is warm.  Neurological:     Mental Status: She is alert and oriented to person, place, and time.     ED Results / Procedures / Treatments   Labs (all labs ordered are listed, but only abnormal results are displayed) Labs Reviewed  URINALYSIS, ROUTINE W REFLEX MICROSCOPIC - Abnormal; Notable for the following components:      Result Value   APPearance HAZY (*)    Glucose, UA >=500 (*)    Nitrite POSITIVE (*)    Leukocytes,Ua MODERATE (*)    Bacteria, UA MANY (*)    All other components within normal limits  COMPREHENSIVE METABOLIC PANEL - Abnormal; Notable for the following components:   Glucose, Bld 373 (*)    BUN 25 (*)    Creatinine, Ser 1.19 (*)    Calcium 10.4 (*)    AST 13 (*)    GFR, Estimated 50 (*)    All other components within normal limits  CBG  MONITORING, ED - Abnormal; Notable for the following components:   Glucose-Capillary 325 (*)    All other components within normal limits  CBC WITH DIFFERENTIAL/PLATELET    EKG None  Radiology DG Scapula Right  Result Date: 05/30/2022 CLINICAL DATA:  Posterior shoulder and scapula pain after fall EXAM: RIGHT SCAPULA - 2+ VIEWS COMPARISON:  None Available. FINDINGS: No evidence of acute fracture. No dislocation. 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 Soft tissues are unremarkable. IMPRESSION: No acute fracture or dislocation. Electronically Signed   By: Placido Sou M.D.   On: 05/30/2022 19:09   DG Ribs Unilateral W/Chest Right  Result Date: 05/30/2022 CLINICAL DATA:  Fall now with right upper rib pain EXAM: RIGHT RIBS AND CHEST - 3+ VIEW COMPARISON:  Radiographs 05/22/2021 FINDINGS: No displaced rib fracture. The lungs are clear. No pneumothorax or pleural effusion. Cholecystectomy clips. IMPRESSION: No displaced rib fractures. Electronically Signed   By: Placido Sou M.D.   On: 05/30/2022 19:08   CT Head Wo Contrast  Result Date: 05/30/2022 CLINICAL DATA:  Fall, head and neck pain. EXAM: CT HEAD WITHOUT CONTRAST CT CERVICAL SPINE WITHOUT CONTRAST TECHNIQUE: Multidetector CT imaging of the head and cervical spine was performed following the standard protocol without intravenous contrast. Multiplanar CT image reconstructions of the cervical spine were also generated. RADIATION DOSE REDUCTION: This exam was performed according to the departmental dose-optimization program which includes automated exposure control, adjustment of the mA and/or kV according to patient size and/or use of iterative reconstruction technique. COMPARISON:  MR head examination dated May 28, 2021 FINDINGS: CT HEAD FINDINGS Brain: No evidence of acute infarction, hemorrhage, hydrocephalus, extra-axial collection or mass lesion/mass effect. Vascular: No hyperdense vessel or unexpected  calcification. Skull: Normal. Negative  for fracture or focal lesion. Sinuses/Orbits: No acute finding. Other: None. CT CERVICAL SPINE FINDINGS Alignment: Straightening of the cervical spine. Skull base and vertebrae: No acute fracture. No primary bone lesion or focal pathologic process. Soft tissues and spinal canal: Ossification of the posterior longitudinal ligament. No prevertebral fluid or swelling. No appreciable canal hematoma. Disc levels: Multilevel degenerate disc disease with marginal osteophytes and associated uncovertebral joint arthropathy prominent at C5-C6. No significant spinal canal or neural foraminal stenosis. No significant facet joint arthropathy. Upper chest: Negative. Other: None IMPRESSION: CT head: 1.  No acute intracranial abnormality. CT cervical spine: 1. No acute fracture or traumatic subluxation. 2. Degenerate disc disease prominent at C5-C6. 3. Ossification of the posterior longitudinal ligament. Electronically Signed   By: Keane Police D.O.   On: 05/30/2022 13:15   CT Cervical Spine Wo Contrast  Result Date: 05/30/2022 CLINICAL DATA:  Fall, head and neck pain. EXAM: CT HEAD WITHOUT CONTRAST CT CERVICAL SPINE WITHOUT CONTRAST TECHNIQUE: Multidetector CT imaging of the head and cervical spine was performed following the standard protocol without intravenous contrast. Multiplanar CT image reconstructions of the cervical spine were also generated. RADIATION DOSE REDUCTION: This exam was performed according to the departmental dose-optimization program which includes automated exposure control, adjustment of the mA and/or kV according to patient size and/or use of iterative reconstruction technique. COMPARISON:  MR head examination dated May 28, 2021 FINDINGS: CT HEAD FINDINGS Brain: No evidence of acute infarction, hemorrhage, hydrocephalus, extra-axial collection or mass lesion/mass effect. Vascular: No hyperdense vessel or unexpected calcification. Skull: Normal. Negative for  fracture or focal lesion. Sinuses/Orbits: No acute finding. Other: None. CT CERVICAL SPINE FINDINGS Alignment: Straightening of the cervical spine. Skull base and vertebrae: No acute fracture. No primary bone lesion or focal pathologic process. Soft tissues and spinal canal: Ossification of the posterior longitudinal ligament. No prevertebral fluid or swelling. No appreciable canal hematoma. Disc levels: Multilevel degenerate disc disease with marginal osteophytes and associated uncovertebral joint arthropathy prominent at C5-C6. No significant spinal canal or neural foraminal stenosis. No significant facet joint arthropathy. Upper chest: Negative. Other: None IMPRESSION: CT head: 1.  No acute intracranial abnormality. CT cervical spine: 1. No acute fracture or traumatic subluxation. 2. Degenerate disc disease prominent at C5-C6. 3. Ossification of the posterior longitudinal ligament. Electronically Signed   By: Keane Police D.O.   On: 05/30/2022 13:15    Procedures Procedures    Medications Ordered in ED Medications  insulin aspart (novoLOG) injection 6 Units (has no administration in time range)    ED Course/ Medical Decision Making/ A&P                           Medical Decision Making Amount and/or Complexity of Data Reviewed Radiology: ordered.  Risk Prescription drug management.   Patient presents after fall.  Right upper back pain.  Also some neck pain.  Differential diagnosis includes fracture, strain, contusion.  Head CT and cervical spine CT reassuring.  Rib films did not for fracture.  Scapular x-ray did not show fracture.  Lab work reassuring although you urine does show likely infection.  Has had hematuria and I think this is likely the cause.  No flank tenderness.  No CVA tenderness.  Think this is not posttraumatic infection.  We will treat with antibiotics.  Discharge home with symptomatic treatment with muscle relaxer for the fall.  PCP follow-up as needed.        Final  Clinical  Impression(s) / ED Diagnoses Final diagnoses:  Fall, initial encounter  Blunt trauma to chest, initial encounter  Acute cystitis with hematuria    Rx / DC Orders ED Discharge Orders          Ordered    methocarbamol (ROBAXIN) 500 MG tablet  Every 8 hours PRN        05/30/22 1955    cephALEXin (KEFLEX) 500 MG capsule  4 times daily        05/30/22 Lacy Duverney, MD 05/30/22 1956

## 2022-05-30 NOTE — ED Notes (Signed)
Patient unable to urinate at this time.  Provided with specimen cup

## 2022-05-30 NOTE — ED Triage Notes (Signed)
Fell from in her bathroom and hurt her neck back and left shoulder.  EMS called out for blood in urine that started yesterday CBG 382 BP 192/108  HR 78 hx [place]

## 2022-05-30 NOTE — ED Provider Triage Note (Signed)
Emergency Medicine Provider Triage Evaluation Note  Cheryl Blankenship , a 71 y.o. female  was evaluated in triage.  Pt complains of hematuria.  She says that she has had hematuria since this morning when she noticed blood in her urine today.  She has not had any dysuria, flank pain, fevers, chills, increased frequency in urination.  She also says that she fell 2 days ago and landed on her back.  She thinks she may have hit her head.  She has had consistent neck pain and shoulder pain since the fall.  reports being recently treated for stage III breast cancer and stopped radiation and chemotherapy due to side effects about three months ago.  Review of Systems  Positive:  Negative:   Physical Exam  BP 133/75   Pulse 81   Temp 99.1 F (37.3 C) (Oral)   Resp 18   Ht '5\' 3"'$  (1.6 m)   Wt 90.7 kg   SpO2 95%   BMI 35.43 kg/m  Gen:   Awake, no distress  1 Resp:  Normal effort  MSK:   Moves extremities without difficulty  Other:  + c spine ttp, - midline thoracic or lumbar  Medical Decision Making  Medically screening exam initiated at 11:03 AM.  Appropriate orders placed.  Cheryl Blankenship was informed that the remainder of the evaluation will be completed by another provider, this initial triage assessment does not replace that evaluation, and the importance of remaining in the ED until their evaluation is complete.     Adolphus Birchwood, PA-C 05/30/22 1105

## 2022-06-06 ENCOUNTER — Inpatient Hospital Stay: Payer: Medicare HMO

## 2022-06-06 ENCOUNTER — Inpatient Hospital Stay: Payer: Medicare HMO | Attending: Nurse Practitioner | Admitting: Hematology

## 2022-06-13 ENCOUNTER — Ambulatory Visit: Payer: Medicare HMO | Admitting: Rehabilitative and Restorative Service Providers"

## 2022-06-23 ENCOUNTER — Other Ambulatory Visit: Payer: Self-pay | Admitting: Internal Medicine

## 2022-06-23 ENCOUNTER — Other Ambulatory Visit: Payer: Self-pay | Admitting: Nurse Practitioner

## 2022-06-23 DIAGNOSIS — Z170 Estrogen receptor positive status [ER+]: Secondary | ICD-10-CM

## 2022-06-24 ENCOUNTER — Inpatient Hospital Stay: Payer: Medicare HMO | Admitting: Hematology

## 2022-06-24 ENCOUNTER — Inpatient Hospital Stay: Payer: Medicare HMO

## 2022-06-27 ENCOUNTER — Other Ambulatory Visit: Payer: Self-pay

## 2022-06-27 ENCOUNTER — Emergency Department (HOSPITAL_BASED_OUTPATIENT_CLINIC_OR_DEPARTMENT_OTHER)
Admission: EM | Admit: 2022-06-27 | Discharge: 2022-06-27 | Disposition: A | Discharge status: Home / Self Care | Payer: Medicare HMO | Attending: Emergency Medicine | Admitting: Emergency Medicine

## 2022-06-27 ENCOUNTER — Encounter (HOSPITAL_BASED_OUTPATIENT_CLINIC_OR_DEPARTMENT_OTHER): Payer: Self-pay

## 2022-06-27 ENCOUNTER — Telehealth: Payer: Self-pay | Admitting: Hematology

## 2022-06-27 ENCOUNTER — Emergency Department (HOSPITAL_BASED_OUTPATIENT_CLINIC_OR_DEPARTMENT_OTHER): Payer: Medicare HMO

## 2022-06-27 DIAGNOSIS — L0291 Cutaneous abscess, unspecified: Secondary | ICD-10-CM

## 2022-06-27 DIAGNOSIS — L02212 Cutaneous abscess of back [any part, except buttock]: Secondary | ICD-10-CM | POA: Diagnosis not present

## 2022-06-27 DIAGNOSIS — M546 Pain in thoracic spine: Secondary | ICD-10-CM | POA: Diagnosis not present

## 2022-06-27 DIAGNOSIS — Z7982 Long term (current) use of aspirin: Secondary | ICD-10-CM | POA: Insufficient documentation

## 2022-06-27 LAB — BASIC METABOLIC PANEL
Anion gap: 8 (ref 5–15)
BUN: 19 mg/dL (ref 8–23)
CO2: 26 mmol/L (ref 22–32)
Calcium: 10.1 mg/dL (ref 8.9–10.3)
Chloride: 104 mmol/L (ref 98–111)
Creatinine, Ser: 0.96 mg/dL (ref 0.44–1.00)
GFR, Estimated: >60 mL/min (ref >60)
Glucose, Bld: 243 mg/dL — ABNORMAL HIGH (ref 70–99)
Potassium: 3.8 mmol/L (ref 3.5–5.1)
Sodium: 138 mmol/L (ref 135–145)

## 2022-06-27 MED ORDER — LIDOCAINE-EPINEPHRINE (PF) 2 %-1:200000 IJ SOLN
INTRAMUSCULAR | Status: AC
  Administered 2022-06-27: 20 mL via INTRADERMAL
  Filled 2022-06-27: qty 20

## 2022-06-27 MED ORDER — SULFAMETHOXAZOLE-TRIMETHOPRIM 800-160 MG PO TABS
1.0000 | ORAL_TABLET | Freq: Once | ORAL | Status: DC
  Filled 2022-06-27: qty 1

## 2022-06-27 MED ORDER — SULFAMETHOXAZOLE-TRIMETHOPRIM 800-160 MG PO TABS: 1.0000 | ORAL_TABLET | Freq: Two times a day (BID) | ORAL | 0 refills | Status: AC

## 2022-06-27 MED ORDER — LIDOCAINE-EPINEPHRINE (PF) 2 %-1:200000 IJ SOLN: 20.0000 mL | Freq: Once | INTRAMUSCULAR | Status: AC

## 2022-06-27 MED ORDER — LIDOCAINE 5 % EX PTCH: 1.0000 | MEDICATED_PATCH | CUTANEOUS | Status: DC

## 2022-06-27 MED ORDER — ACETAMINOPHEN 500 MG PO TABS
1000.0000 mg | ORAL_TABLET | Freq: Once | ORAL | Status: AC
  Administered 2022-06-27: 1000 mg via ORAL
  Filled 2022-06-27: qty 2

## 2022-06-27 MED ORDER — IOHEXOL 300 MG/ML  SOLN
70.0000 mL | Freq: Once | INTRAMUSCULAR | Status: AC | PRN
  Administered 2022-06-27: 70 mL via INTRAVENOUS

## 2022-06-27 NOTE — ED Provider Notes (Signed)
[facility] EMERGENCY DEPARTMENT Provider Note   CSN: [id number] Arrival date & time: 06/27/22  0117     History  Chief Complaint  Patient presents with   Back Pain    Cheryl Blankenship is a 71 y.o. female.  The history is provided by the patient.  Back Pain Location:  Thoracic spine Pain severity:  Moderate Pain is:  Same all the time Onset quality:  Gradual Duration:  1 month Timing:  Constant Progression:  Worsening Context: falling   Context comment:  Seen for a fall one month ago and has ongoing pain and raised area where a mole was removed previously. Relieved by:  Nothing Worsened by:  Nothing Ineffective treatments:  None tried Associated symptoms: no abdominal pain, no chest pain and no fever   Risk factors: no hx of cancer        Home Medications Prior to Admission medications   Medication Sig Start Date End Date Taking? Authorizing Provider  Accu-Chek FastClix Lancets MISC 1 each by Other route in the morning, at noon, and at bedtime. 08/14/20   [provider]  acetaminophen (TYLENOL) 500 MG tablet Take 1 tablet (500 mg total) by mouth every 6 (six) hours as needed for moderate pain, headache or mild pain. 08/09/21   Thurnell Lose, MD  albuterol (VENTOLIN HFA) 108 (90 Base) MCG/ACT inhaler Inhale 1-2 puffs into the lungs every 4 (four) hours as needed for wheezing or shortness of breath. 09/01/20   Alla Feeling, NP  ALPRAZolam Duanne Moron) 0.25 MG tablet Take 1-2 tablets (0.25-0.5 mg total) by mouth 3 (three) times daily as needed for anxiety or sleep. 02/26/21   [name], MD  amLODipine (NORVASC) 10 MG tablet Take 10 mg by mouth daily.    [provider]  aspirin 81 MG chewable tablet Chew 81 mg by mouth daily.     [provider]  bisacodyl (DULCOLAX) 5 MG EC tablet Take 1 tablet (5 mg total) by mouth daily as needed for moderate constipation. 11/04/20   Antonieta Pert, MD  carvedilol (COREG) 25 MG tablet Take 25 mg by  mouth 2 (two) times daily with a meal.    [provider]  cephALEXin (KEFLEX) 500 MG capsule Take 1 capsule (500 mg total) by mouth 4 (four) times daily. 05/30/22   Davonna Belling, MD  cholecalciferol (VITAMIN D) 25 MCG (1000 UNIT) tablet Take 1,000 Units by mouth at bedtime.    [provider]  cycloSPORINE (RESTASIS) 0.05 % ophthalmic emulsion Place 1 drop into both eyes 2 (two) times daily.    [provider]  DULoxetine (CYMBALTA) 30 MG capsule Take 1 tablet by mouth daily. 30 mg daily 05/26/21   [provider]  exemestane (AROMASIN) 25 MG tablet TAKE 1 TABLET BY MOUTH EVERY DAY AFTER breakfast 06/23/22   Alla Feeling, NP  folic acid (FOLVITE) 1 MG tablet Take 1 mg by mouth daily. 07/14/20   [provider]  furosemide (LASIX) 20 MG tablet Take 1 tablet (20 mg total) by mouth daily as needed (for swelling or weight gain). 06/14/21   O'NealCassie Freer, MD  gabapentin (NEURONTIN) 400 MG capsule Take 400 mg by mouth daily.    [provider]  glipiZIDE (GLUCOTROL XL) 5 MG 24 hr tablet Take 1 tablet by mouth daily with breakfast. 11/09/20   [provider]  Insulin Aspart (NOVOLOG FLEXPEN [place]) Inject 0-10 Units into the skin with breakfast, with lunch, and with evening meal.  0-150 = 0 units 150-199 = 2 units 200-249 = 4 units 250-299 = 6 units 300-349 = 8 units Greater than 350 = 10 units    [provider]  insulin glargine (LANTUS) 100 UNIT/ML injection Inject 0.3 mLs (30 Units total) into the skin 2 (two) times daily. 08/09/21   Thurnell Lose, MD  JARDIANCE 10 MG TABS tablet Take by mouth. 01/03/22   [provider]  lactulose (CHRONULAC) 10 GM/15ML solution Take 30 g by mouth daily.    [provider]  losartan (COZAAR) 100 MG tablet Take 1 tablet (100 mg total) by mouth daily. 06/14/21   Geralynn Rile, MD  metFORMIN (GLUCOPHAGE) 500 MG tablet Take 500 mg by mouth 2 (two) times daily with  a meal.  06/25/20   [provider]  methocarbamol (ROBAXIN) 500 MG tablet Take 1 tablet (500 mg total) by mouth every 8 (eight) hours as needed for muscle spasms. 05/30/22   Davonna Belling, MD  Omega-3 Fatty Acids (FISH OIL) 1000 MG CAPS Take 1,000 mg by mouth daily.    [provider]  omeprazole (PRILOSEC) 20 MG capsule Take 1 capsule (20 mg total) by mouth daily. 10/09/20   Geralynn Rile, MD  ondansetron (ZOFRAN-ODT) 4 MG disintegrating tablet Take 4 mg by mouth 2 (two) times daily as needed for nausea or vomiting.    [provider]  potassium chloride (MICRO-K) 10 MEQ CR capsule Take 10 mEq by mouth daily.    [provider]  pregabalin (LYRICA) 75 MG capsule TAKE 1 CAPSULE BY MOUTH 2 TIMES DAILY 06/23/22   Vaslow, Acey Lav, MD  PROMETHEGAN 25 MG suppository place 1 SUPPOSITORY rectally EVERY 8 HOURS AS NEEDED FOR NAUSEA AND VOMITING 06/09/21   [name], MD  RESTASIS 0.05 % ophthalmic emulsion Place 1 drop into both eyes 2 (two) times daily as needed (dry eyes). 07/13/20   [provider]  rOPINIRole (REQUIP) 0.25 MG tablet TAKE 1 TABLET 3 TIMES DAILY x7 DAYS, THEN TAKE 2 TABLETS 3 TIMES DAILY x7 DAYS, THEN TAKE 3 TABLETS 3 TIMES DAILY x7 DAYS, THEN TAKE 4 TABLETS 3 TIMES DAILY 07/20/21   Vaslow, Acey Lav, MD  rosuvastatin (CRESTOR) 5 MG tablet Take 5 mg by mouth at bedtime.    [provider]  spironolactone (ALDACTONE) 25 MG tablet Take 1 tablet (25 mg total) by mouth daily. 06/14/21   O'NealCassie Freer, MD  vitamin B-12 (CYANOCOBALAMIN) 1000 MCG tablet Take 1,000 mcg by mouth daily.    [provider]  zolpidem (AMBIEN) 5 MG tablet Take 1 tablet (5 mg total) by mouth at bedtime as needed for sleep. 10/28/20   [name], MD      Allergies    Cocos nucifera, Hydrochlorothiazide, Lisinopril, Coconut (cocos nucifera), Cocos nucifera, Hydrochlorothiazide, Other, Pork-derived products, and Lisinopril    Review of Systems    Review of Systems  Constitutional:  Negative for fever.  HENT:  Negative for facial swelling.   Eyes:  Negative for redness.  Respiratory:  Negative for shortness of breath.   Cardiovascular:  Negative for chest pain.  Gastrointestinal:  Negative for abdominal pain.  Musculoskeletal:  Positive for back pain.  All other systems reviewed and are negative.   Physical Exam Updated Vital Signs BP (!) 139/58   Pulse 73   Temp 98.4 F (36.9 C)   Resp 18   Ht '5\' 3"'$  (1.6 m)   Wt 90 kg   SpO2 93%  BMI 35.15 kg/m  Physical Exam Vitals and nursing note reviewed.  Constitutional:      General: She is not in acute distress.    Appearance: Normal appearance. She is well-developed.  HENT:     Head: Normocephalic and atraumatic.     Nose: Nose normal.  Eyes:     Pupils: Pupils are equal, round, and reactive to light.  Cardiovascular:     Rate and Rhythm: Normal rate and regular rhythm.     Pulses: Normal pulses.     Heart sounds: Normal heart sounds.  Pulmonary:     Effort: Pulmonary effort is normal. No respiratory distress.     Breath sounds: Normal breath sounds.  Abdominal:     General: Bowel sounds are normal. There is no distension.     Palpations: Abdomen is soft.     Tenderness: There is no abdominal tenderness. There is no guarding or rebound.  Genitourinary:    Vagina: No vaginal discharge.  Musculoskeletal:        General: Normal range of motion.       Arms:     Cervical back: Neck supple.  Skin:    General: Skin is warm and dry.     Capillary Refill: Capillary refill takes less than 2 seconds.     Findings: No erythema or rash.  Neurological:     General: No focal deficit present.     Mental Status: She is alert and oriented to person, place, and time.     Deep Tendon Reflexes: Reflexes normal.  Psychiatric:        Mood and Affect: Mood normal.     ED Results / Procedures / Treatments   Labs (all labs ordered are listed, but only abnormal results are  displayed) Results for orders placed or performed during the hospital encounter of 62/83/66  Basic metabolic panel  Result Value Ref Range   Sodium 138 135 - 145 mmol/L   Potassium 3.8 3.5 - 5.1 mmol/L   Chloride 104 98 - 111 mmol/L   CO2 26 22 - 32 mmol/L   Glucose, Bld 243 (H) 70 - 99 mg/dL   BUN 19 8 - 23 mg/dL   Creatinine, Ser 0.96 0.44 - 1.00 mg/dL   Calcium 10.1 8.9 - 10.3 mg/dL   GFR, Estimated >60 >60 mL/min   Anion gap 8 5 - 15   CT Chest W Contrast  Result Date: 06/27/2022 CLINICAL DATA:  Blunt poly trauma. Raised red area on back. History of trauma 2 weeks ago EXAM: CT CHEST WITH CONTRAST TECHNIQUE: Multidetector CT imaging of the chest was performed during intravenous contrast administration. RADIATION DOSE REDUCTION: This exam was performed according to the departmental dose-optimization program which includes automated exposure control, adjustment of the mA and/or kV according to patient size and/or use of iterative reconstruction technique. CONTRAST:  23m OMNIPAQUE IOHEXOL 300 MG/ML  SOLN COMPARISON:  05/22/2021 FINDINGS: Cardiovascular: Normal heart size. No pericardial effusion. No acute vascular finding. Mediastinum/Nodes: No mediastinal mass or adenopathy. No pneumomediastinum. Postoperative left breast with fluid collection surrounded by soft tissue density rim, contracted since prior. Lungs/Pleura: Dependent density is attributed to atelectasis. There is no edema, consolidation, effusion, or pneumothorax. Upper Abdomen: No acute finding Musculoskeletal: In the right paramedian back is a rim enhancing fluid collection involving the dermis and subcutaneous fat measuring up to 4.1 cm. Regional skin thickening. Generalized spondylosis with exaggerated kyphosis. No acute or aggressive finding. IMPRESSION: 1. 4 cm dermal and subcutaneous fluid collection in the  right paramedian back, presumed abscess. 2. No acute finding in the thorax. Electronically Signed   By: Jorje Guild  M.D.   On: 06/27/2022 04:54   DG Scapula Right  Result Date: 05/30/2022 CLINICAL DATA:  Posterior shoulder and scapula pain after fall EXAM: RIGHT SCAPULA - 2+ VIEWS COMPARISON:  None Available. FINDINGS: No evidence of acute fracture. No dislocation. 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 Soft tissues are unremarkable. IMPRESSION: No acute fracture or dislocation. Electronically Signed   By: Placido Sou M.D.   On: 05/30/2022 19:09   DG Ribs Unilateral W/Chest Right  Result Date: 05/30/2022 CLINICAL DATA:  Fall now with right upper rib pain EXAM: RIGHT RIBS AND CHEST - 3+ VIEW COMPARISON:  Radiographs 05/22/2021 FINDINGS: No displaced rib fracture. The lungs are clear. No pneumothorax or pleural effusion. Cholecystectomy clips. IMPRESSION: No displaced rib fractures. Electronically Signed   By: Placido Sou M.D.   On: 05/30/2022 19:08   CT Head Wo Contrast  Result Date: 05/30/2022 CLINICAL DATA:  Fall, head and neck pain. EXAM: CT HEAD WITHOUT CONTRAST CT CERVICAL SPINE WITHOUT CONTRAST TECHNIQUE: Multidetector CT imaging of the head and cervical spine was performed following the standard protocol without intravenous contrast. Multiplanar CT image reconstructions of the cervical spine were also generated. RADIATION DOSE REDUCTION: This exam was performed according to the departmental dose-optimization program which includes automated exposure control, adjustment of the mA and/or kV according to patient size and/or use of iterative reconstruction technique. COMPARISON:  MR head examination dated May 28, 2021 FINDINGS: CT HEAD FINDINGS Brain: No evidence of acute infarction, hemorrhage, hydrocephalus, extra-axial collection or mass lesion/mass effect. Vascular: No hyperdense vessel or unexpected calcification. Skull: Normal. Negative for fracture or focal lesion. Sinuses/Orbits: No acute finding. Other: None. CT CERVICAL SPINE FINDINGS Alignment: Straightening of the cervical  spine. Skull base and vertebrae: No acute fracture. No primary bone lesion or focal pathologic process. Soft tissues and spinal canal: Ossification of the posterior longitudinal ligament. No prevertebral fluid or swelling. No appreciable canal hematoma. Disc levels: Multilevel degenerate disc disease with marginal osteophytes and associated uncovertebral joint arthropathy prominent at C5-C6. No significant spinal canal or neural foraminal stenosis. No significant facet joint arthropathy. Upper chest: Negative. Other: None IMPRESSION: CT head: 1.  No acute intracranial abnormality. CT cervical spine: 1. No acute fracture or traumatic subluxation. 2. Degenerate disc disease prominent at C5-C6. 3. Ossification of the posterior longitudinal ligament. Electronically Signed   By: Keane Police D.O.   On: 05/30/2022 13:15   CT Cervical Spine Wo Contrast  Result Date: 05/30/2022 CLINICAL DATA:  Fall, head and neck pain. EXAM: CT HEAD WITHOUT CONTRAST CT CERVICAL SPINE WITHOUT CONTRAST TECHNIQUE: Multidetector CT imaging of the head and cervical spine was performed following the standard protocol without intravenous contrast. Multiplanar CT image reconstructions of the cervical spine were also generated. RADIATION DOSE REDUCTION: This exam was performed according to the departmental dose-optimization program which includes automated exposure control, adjustment of the mA and/or kV according to patient size and/or use of iterative reconstruction technique. COMPARISON:  MR head examination dated May 28, 2021 FINDINGS: CT HEAD FINDINGS Brain: No evidence of acute infarction, hemorrhage, hydrocephalus, extra-axial collection or mass lesion/mass effect. Vascular: No hyperdense vessel or unexpected calcification. Skull: Normal. Negative for fracture or focal lesion. Sinuses/Orbits: No acute finding. Other: None. CT CERVICAL SPINE FINDINGS Alignment: Straightening of the cervical spine. Skull base and vertebrae: No acute  fracture. No primary bone lesion or focal pathologic process. Soft tissues  and spinal canal: Ossification of the posterior longitudinal ligament. No prevertebral fluid or swelling. No appreciable canal hematoma. Disc levels: Multilevel degenerate disc disease with marginal osteophytes and associated uncovertebral joint arthropathy prominent at C5-C6. No significant spinal canal or neural foraminal stenosis. No significant facet joint arthropathy. Upper chest: Negative. Other: None IMPRESSION: CT head: 1.  No acute intracranial abnormality. CT cervical spine: 1. No acute fracture or traumatic subluxation. 2. Degenerate disc disease prominent at C5-C6. 3. Ossification of the posterior longitudinal ligament. Electronically Signed   By: Keane Police D.O.   On: 05/30/2022 13:15    EKG None  Radiology No results found.  Procedures .Marland KitchenIncision and Drainage  Date/Time: 06/27/2022 5:31 AM  Performed by: Veatrice Kells, MD Authorized by: Veatrice Kells, MD   Consent:    Consent obtained:  Verbal   Consent given by:  Patient   Risks, benefits, and alternatives were discussed: yes     Risks discussed:  Bleeding, incomplete drainage, infection and pain   Alternatives discussed:  Alternative treatment Universal protocol:    Patient identity confirmed:  Verbally with patient and arm band Location:    Type:  Abscess   Location:  Trunk   Trunk location:  Back Pre-procedure details:    Skin preparation:  Chlorhexidine and povidone-iodine Sedation:    Sedation type:  None Anesthesia:    Anesthesia method:  Local infiltration   Local anesthetic:  Lidocaine 1% WITH epi Procedure type:    Complexity:  Complex Procedure details:    Incision types:  Elliptical   Incision depth:  Subcutaneous   Wound management:  Probed and deloculated and extensive cleaning   Drainage:  Purulent   Drainage amount:  Copious   Wound treatment:  Wound left open Post-procedure details:    Procedure completion:   Tolerated well, no immediate complications     Medications Ordered in ED Medications  lidocaine (LIDODERM) 5 % 1 patch (has no administration in time range)  iohexol (OMNIPAQUE) 300 MG/ML solution 70 mL (70 mLs Intravenous Contrast Given 06/27/22 0433)    ED Course/ Medical Decision Making/ A&P                           Medical Decision Making Pain in the back with raised area since a fall one month ago at site of mole removal   Amount and/or Complexity of Data Reviewed Independent Historian: spouse    Details: See above  External Data Reviewed: notes.    Details: Previous notes reviewed  Labs: ordered.    Details: Normal sodium 138, normal potassium 3.8, normal creatinine .64 Radiology: ordered.  Risk Prescription drug management. Risk Details: ID under sterile technique, EDP was gowned gloved and masked.  Extensive cleaning.  Toleratedc well, will start antibiotics.  Follow up with your PMD.  Strict return.      Final Clinical Impression(s) / ED Diagnoses Final diagnoses:  None  Return for intractable cough, coughing up blood, fevers > 100.4 unrelieved by medication, shortness of breath, intractable vomiting, chest pain, shortness of breath, weakness, numbness, changes in speech, facial asymmetry, abdominal pain, passing out, Inability to tolerate liquids or food, cough, altered mental status or any concerns. No signs of systemic illness or infection. The patient is nontoxic-appearing on exam and vital signs are within normal limits.  I have reviewed the triage vital signs and the nursing notes. Pertinent labs & imaging results that were available during my care of the patient were reviewed  by me and considered in my medical decision making (see chart for details). After history, exam, and medical workup I feel the patient has been appropriately medically screened and is safe for discharge home. Pertinent diagnoses were discussed with the patient. Patient was given return  precautions.   Rx / DC Orders ED Discharge Orders     None         Eusevio Schriver, MD 06/27/22 8006

## 2022-06-27 NOTE — ED Triage Notes (Addendum)
Pt to ED by POV from home with c/o an approx 10cm raised red area below her shoulder blades. Pt states she fell on her back 2 weeks ago and was treated for muscle spasms but that this has developed since then. Arrives A+O, VSS, NADN. Pt has a hx of breast cancer and mastectomy.

## 2022-06-27 NOTE — ED Notes (Signed)
Pt to ct 

## 2022-06-27 NOTE — Telephone Encounter (Signed)
Scheduled per 10/9 in basket, daughter has been called and confirmed

## 2022-07-12 ENCOUNTER — Inpatient Hospital Stay: Payer: Medicare HMO | Admitting: Hematology

## 2022-07-12 ENCOUNTER — Inpatient Hospital Stay: Payer: Medicare HMO | Attending: Nurse Practitioner

## 2022-07-20 ENCOUNTER — Inpatient Hospital Stay: Payer: Medicare HMO | Attending: Nurse Practitioner

## 2022-07-20 ENCOUNTER — Inpatient Hospital Stay: Payer: Medicare HMO | Admitting: Hematology

## 2022-07-20 DIAGNOSIS — Z170 Estrogen receptor positive status [ER+]: Secondary | ICD-10-CM

## 2022-10-13 ENCOUNTER — Other Ambulatory Visit: Payer: Self-pay | Admitting: Internal Medicine

## 2022-10-13 ENCOUNTER — Other Ambulatory Visit: Payer: Self-pay | Admitting: Cardiovascular Disease

## 2022-10-21 ENCOUNTER — Other Ambulatory Visit: Payer: Self-pay | Admitting: Student

## 2022-10-21 DIAGNOSIS — E2839 Other primary ovarian failure: Secondary | ICD-10-CM

## 2022-11-03 ENCOUNTER — Encounter: Payer: Self-pay | Admitting: Student

## 2022-11-30 ENCOUNTER — Other Ambulatory Visit: Payer: Self-pay | Admitting: Nurse Practitioner

## 2022-11-30 DIAGNOSIS — C50412 Malignant neoplasm of upper-outer quadrant of left female breast: Secondary | ICD-10-CM

## 2022-12-07 ENCOUNTER — Telehealth: Payer: Self-pay | Admitting: Hematology

## 2022-12-07 NOTE — Telephone Encounter (Signed)
Contacted patient to scheduled appointments. Patient is aware of appointments that are scheduled.   

## 2023-02-08 ENCOUNTER — Other Ambulatory Visit: Payer: Self-pay

## 2023-02-08 DIAGNOSIS — Z170 Estrogen receptor positive status [ER+]: Secondary | ICD-10-CM

## 2023-02-09 ENCOUNTER — Inpatient Hospital Stay: Payer: Medicare HMO | Admitting: Hematology

## 2023-02-09 ENCOUNTER — Inpatient Hospital Stay: Payer: Medicare HMO | Attending: Hematology

## 2023-02-09 NOTE — Progress Notes (Deleted)
[facility]   Telephone:([phone]) [phone] [phone]) [phone]   Clinic Follow up Note   Patient Care Team: Hillery Aldo, NP as PCP - General Manus Rudd, MD as Consulting Physician (General Surgery) Malachy Mood, MD as Consulting Physician (Hematology) Dorothy Puffer, MD as Consulting Physician (Radiation Oncology) [name], Ronnald Ramp, MD as Consulting Physician (Cardiology)  Date of Service:  02/09/2023  CHIEF COMPLAINT: f/u of {diagnosis, copy from prior note}  CURRENT THERAPY:  {Usually copy/paste from prior note, but pay attention to if treatment changes. Or change to Surveillance if they finished treatment}  ASSESSMENT: *** Cheryl Blankenship is a 72 y.o. female with   No problem-specific Assessment & Plan notes found for this encounter.  ***   PLAN: {Everything Dr. Mosetta Putt talks to pt about, including reviewing scans and labs. } -{proceed with ***} -{lab with/without flush and f/u when?}   SUMMARY OF ONCOLOGIC HISTORY: Oncology History Overview Note  Cancer Staging Malignant neoplasm of upper-outer quadrant of left breast in female, estrogen receptor positive (HCC) Staging form: Breast, AJCC 8th Edition - Clinical stage from 07/16/2020: Stage IB (cT2, cN1, cM0, G2, ER+, PR+, HER2+) - Signed by Malachy Mood, MD on 07/21/2020    Malignant neoplasm of upper-outer quadrant of left breast in female, estrogen receptor positive (HCC)  07/01/2020 Mammogram   IMPRESSION: 1. There is a 12 cm span of highly suspicious calcifications involving the upper outer and upper inner quadrants of the left breast. On ultrasound, within this area of calcifications, there are confluent masses at 1 o'clock spanning at least 4.5 cm. An additional suspicious mass is seen in the left breast at 2 o'clock.  -Ultrasound targeted to the left breast at 1 o'clock, 5 cm from the nipple demonstrates a broad area of hypoechoic irregular tissue/confluent masses spanning at least 4.5 cm. There  is a separate oval hypoechoic mass with indistinct margins at 2 o'clock, 7 cm from the nipple measuring 0.9 x 0.4 x 0.9 cm. Ultrasound of the left axilla demonstrates 2 abnormally thickened lymph nodes with cortices measuring 6-7 mm. There is an additional lymph node with a borderline cortex of 4 mm.   2. There are 2 lymph nodes with thickened cortices in the left axilla, and an additional lymph node with a borderline thickened cortex.   07/16/2020 Cancer Staging   Staging form: Breast, AJCC 8th Edition - Clinical stage from 07/16/2020: Stage IIA (cT3, cN1, cM0, G2, ER+, PR+, HER2+) - Signed by Malachy Mood, MD on 08/05/2020   07/16/2020 Initial Biopsy   Diagnosis 1. 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 Breast, left, needle core biopsy, 1 o'clock - INVASIVE DUCTAL CARCINOMA - SEE COMMENT 3. Lymph node, biopsy - METASTATIC CARCINOMA INVOLVING A LYMPH NODE - SEE COMMENT Microscopic Comment 1. Based on the biopsy, the ductal carcinoma in situ has a comedo pattern, high nuclear grade and measures 0.7 cm in greatest linear extent. Prognostic markers (ER/PR) are pending and will be reported in an addendum. 2. Based on the biopsy, the carcinoma appears Nottingham grade 2 of 3 and measures 1.5 cm in greatest linear extent. Prognostic markers (ER/PR/ki-67/HER2) are pending and will be reported in an addendum. Dr. Luisa Hart reviewed the case and agrees with the above diagnosis. These results were called to [facility] on July 17, 2020. 3. Based on the biopsy, the carcinoma appears measures 1.0 cm in greatest linear extent. Prognostic  markers (ER/PR/ki-67/HER2) are pending and will be reported in an addendum.   07/16/2020 Receptors her2   1. PROGNOSTIC INDICATORS Results: IMMUNOHISTOCHEMICAL AND MORPHOMETRIC ANALYSIS PERFORMED MANUALLY Estrogen Receptor: 30%,  POSITIVE, STRONG STAINING INTENSITY Progesterone Receptor: 5%, POSITIVE, STRONG STAINING INTENSITY  2. PROGNOSTIC INDICATORS Results: IMMUNOHISTOCHEMICAL AND MORPHOMETRIC ANALYSIS PERFORMED MANUALLY The tumor cells are POSITIVE for Her2 (3+). Estrogen Receptor: 85%, POSITIVE, STRONG STAINING INTENSITY Progesterone Receptor: 60%, POSITIVE, STRONG STAINING INTENSITY Proliferation Marker Ki67: 25%  3. PROGNOSTIC INDICATORS Results: IMMUNOHISTOCHEMICAL AND MORPHOMETRIC ANALYSIS PERFORMED MANUALLY The tumor cells are POSITIVE for Her2 (3+). Estrogen Receptor: 95%, POSITIVE, STRONG STAINING INTENSITY Progesterone Receptor: 45%, POSITIVE, STRONG STAINING INTENSITY Proliferation Marker Ki67: 30%   07/20/2020 Initial Diagnosis   Malignant neoplasm of upper-outer quadrant of left breast in female, estrogen receptor positive (HCC)   07/31/2020 Echocardiogram   Baseline Echo  IMPRESSIONS     1. Left ventricular ejection fraction, by estimation, is 65 to 70%. The  left ventricle has normal function. The left ventricle has no regional  wall motion abnormalities. Left ventricular diastolic parameters are  indeterminate. The average left  ventricular global longitudinal strain is -17.1 %.   2. Right ventricular systolic function is normal. The right ventricular  size is normal. There is normal pulmonary artery systolic pressure.   3. The mitral valve is normal in structure. Trivial mitral valve  regurgitation. No evidence of mitral stenosis.   4. The aortic valve is normal in structure. Aortic valve regurgitation is  not visualized. No aortic stenosis is present.    07/31/2020 Breast MRI   IMPRESSION: 1. Extensive non mass enhancement within the left breast extending from the nipple to the chest wall. Overall findings are most compatible with extensive malignancy involving the majority of the left breast. 2. Indeterminate enhancing mass within the upper inner right breast. 3. Three  morphologically abnormal lymph nodes within the left axilla. One of these nodes has been biopsied compatible with metastatic adenopathy.     08/03/2020 Imaging   Bone Scan whole body  IMPRESSION: Increased radiotracer uptake in the mid to lower lumbar regions of uncertain etiology. Correlation with radiography to assess for potential arthropathy in these areas advised. Increased uptake in major joints is likely of arthropathic etiology. Increased uptake in the mid face is likely due to paranasal sinus disease. Distribution of radiotracer uptake in bony structures elsewhere unremarkable.   08/04/2020 Procedure   PAC placement by Dr Corliss Skains   08/05/2020 - 10/28/2020 Neo-Adjuvant Chemotherapy   Neoadjuvant THP q3weeks for 4-6 cycles starting 08/05/20, followed by Herceptin or Kadcyla q3weeks to complete 1 year of treatment. Stopped THP after 1 dose due to poor toleration       -I switched her to Kadcyla q3weeks starting 08/25/20. Stopped after C2 due to poor toleration (10/28/20)   08/07/2020 Imaging   CT CAP  IMPRESSION: 2.7 cm mass in the upper outer left breast, likely corresponding to the patient's known primary breast neoplasm.   8 mm short axis left axillary node, suspicious for nodal metastasis in this clinical context.   No findings suspicious for distant metastasis.   Endometrium is mildly prominent, measuring 11 mm. Correlate for vaginal bleeding and consider pelvic ultrasound for further evaluation if clinically warranted.   08/11/2020 Pathology Results   Diagnosis Breast, right, needle core biopsy, right breast - PAPILLARY LESION - SEE COMMENT Microscopic Comment The biopsy consists of a papillary lesion with a focal area of adenosis. Immunohistochemistry (SMM, calponin and p63) shows retention of the myoepithelial  cell layer in the focus of adenosis. 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 These results were called to [facility] on August 12, 2020.   10/27/2020 Imaging   CT Angio chest  IMPRESSION: Slightly suboptimal opacification of the main pulmonary artery, however no central or proximal segmental pulmonary embolism   Minimal bibasilar atelectasis   Aortic Atherosclerosis (ICD10-I70.0).   10/31/2020 Imaging   CT AP  IMPRESSION: 1. No acute finding by CT other than a large amount of stool and gas in the right colon and gas in the transverse colon. No small bowel obstruction. 2. Previous cholecystectomy. 3. Aortic atherosclerosis. 4. Umbilical hernia containing only fat. 5. Small amount of fluid in the endometrial canal as seen previously.   Aortic Atherosclerosis (ICD10-I70.0).   12/04/2020 Imaging   MRI Breast IMPRESSION: 1. Evidence of a positive response to interval treatment. There has been a significant improvement of both the extent and strength of the enhancement throughout the outer LEFT breast when compared to the earlier MRI of 07/31/2020. 2. However, there remains scattered areas of mass and non-mass enhancement throughout the outer LEFT breast, involving the upper outer quadrant and lower outer quadrant, as detailed above. 3. The 3 morphologically abnormal lymph nodes in the LEFT axilla identified on previous MRI, 1 a biopsy-proven metastasis, are all slightly smaller indicating an additional positive response to interval treatment. 4. No evidence of malignancy within the RIGHT breast.   12/10/2020 - 09/01/2021 Chemotherapy   Patient is on Treatment Plan : BREAST Trastuzumab + Perjeta (05/06/21) q21d      12/30/2020 Surgery   A. BREAST, LEFT, MASTECTOMY:  - Focal residual invasive ductal carcinoma status post neoadjuvant  treatment.  - Residual ductal carcinoma in situ.  - Margins of resection are not involved.  - Biopsy clip (X2).   B. LYMPH NODES, LEFT #1, DISSECTION:  - Three lymph nodes, negative for carcinoma (0/3).  - Biopsy clip (X1).   C. LYMPH NODES,  LEFT #2, DISSECTION:  - Two lymph nodes, negative for carcinoma (0/2).   PROGNOSTIC INDICATOR RESULTS:  - The tumor cells are POSITIVE for Her2 (3+).  - Estrogen Receptor:       POSITIVE, 90%, STRONG STAINING  - Progesterone Receptor:   POSITIVE, 50%, MODERATE STAINING    12/30/2020 Cancer Staging   Staging form: Breast, AJCC 8th Edition - Pathologic stage from 12/30/2020: No Stage Recommended (ypT1a, pN0, cM0, G2, ER+, PR+, HER2+) - Signed by Malachy Mood, MD on 01/20/2021 Stage prefix: Post-therapy Histologic grading system: 3 grade system Residual tumor (R): R0 - None   02/17/2021 - 04/08/2021 Radiation Therapy    Site Technique Total Dose (Gy) Dose per Fx (Gy) Completed Fx Beam Energies  Chest Wall, Left: CW_Lt 3D 50.4/50.4 1.8 28/28 10X  Chest Wall, Left: CW_Lt_SCLV 3D 50.4/50.4 1.8 28/28 6X, 15X  Chest Wall, Left: CW_Lt_Bst Electron 10/10 2 5/5 9E     Genetic Testing   [facility] CustomNext Panel was Negative. Report date is 03/08/2022.  The CustomNext-Cancer+RNAinsight panel offered by [employer] includes sequencing and rearrangement analysis for the following 47 genes:  APC, ATM, AXIN2, BARD1, BMPR1A, BRCA1, BRCA2, BRIP1, CDH1, CDK4, CDKN2A, CHEK2, CTNNA1, DICER1, EPCAM, GREM1, HOXB13, KIT, MEN1, MLH1, MSH2, MSH3, MSH6, MUTYH, NBN, NF1, NTHL1, PALB2, PDGFRA, PMS2, POLD1, POLE, PTEN, RAD50, RAD51C, RAD51D, SDHA, SDHB, SDHC, SDHD, SMAD4, SMARCA4, STK11, TP53, TSC1, TSC2, and VHL.  RNA data is routinely analyzed for use in variant interpretation for all genes.  INTERVAL HISTORY: *** Cheryl Blankenship is here for a follow up of {diagnosis} She was last seen by {provider} on {date} She presents to the clinic {alone/accompanied by}. {Everything the pt says, basically. How they're feeling, complaints and concerns, etc}   All other systems were reviewed with the patient and are negative.  MEDICAL HISTORY:  Past Medical History:  Diagnosis Date   Anxiety    Breast cancer (HCC)     Cancer (HCC)    CHF (congestive heart failure) (HCC)    Congestive heart failure (CHF) (HCC)    Depression    Diabetes mellitus without complication (HCC)    type 2   Diabetes mellitus without complication (HCC)    GERD (gastroesophageal reflux disease)    High cholesterol    History of kidney stones    Hypertension    PONV (postoperative nausea and vomiting)    Stroke (HCC)     SURGICAL HISTORY: Past Surgical History:  Procedure Laterality Date   APPENDECTOMY     BREAST SURGERY     CHOLECYSTECTOMY     COLONOSCOPY     ESOPHAGOGASTRODUODENOSCOPY (EGD) WITH PROPOFOL N/A 11/01/2020   Procedure: ESOPHAGOGASTRODUODENOSCOPY (EGD) WITH PROPOFOL;  Surgeon: Willis Modena, MD;  Location: [facility];  Service: Endoscopy;  Laterality: N/A;   EYE SURGERY     cataracts   MASTECTOMY     MASTECTOMY WITH AXILLARY LYMPH NODE DISSECTION Left 12/30/2020   Procedure: LEFT MASTECTOMY WITH TARGETED AXILLARY LYMPH NODE DISSECTION;  Surgeon: Manus Rudd, MD;  Location: [facility] OR;  Service: General;  Laterality: Left;   MULTIPLE TOOTH EXTRACTIONS     PORT-A-CATH REMOVAL Right 04/13/2022   Procedure: REMOVAL PORT-A-CATH;  Surgeon: Manus Rudd, MD;  Location: [facility];  Service: General;  Laterality: Right;   PORTACATH PLACEMENT N/A 08/04/2020   Procedure: INSERTION PORT-A-CATH WITH ULTRASOUND GUIDANCE;  Surgeon: Manus Rudd, MD;  Location: [facility] OR;  Service: General;  Laterality: N/A;   STOMACH SURGERY      I have reviewed the social history and family history with the patient and they are unchanged from previous note.  ALLERGIES:  is allergic to cocos nucifera, hydrochlorothiazide, lisinopril, coconut (cocos nucifera), cocos nucifera, hydrochlorothiazide, other, pork-derived products, and lisinopril.  MEDICATIONS:  Current Outpatient Medications  Medication Sig Dispense Refill   Accu-Chek FastClix Lancets MISC 1 each by Other route in the morning, at noon, and at bedtime.      acetaminophen (TYLENOL) 500 MG tablet Take 1 tablet (500 mg total) by mouth every 6 (six) hours as needed for moderate pain, headache or mild pain. 20 tablet 0   albuterol (VENTOLIN HFA) 108 (90 Base) MCG/ACT inhaler Inhale 1-2 puffs into the lungs every 4 (four) hours as needed for wheezing or shortness of breath. 8 g 0   ALPRAZolam (XANAX) 0.25 MG tablet Take 1-2 tablets (0.25-0.5 mg total) by mouth 3 (three) times daily as needed for anxiety or sleep. 30 tablet 0   amLODipine (NORVASC) 10 MG tablet Take 10 mg by mouth daily.     aspirin 81 MG chewable tablet Chew 81 mg by mouth daily.      bisacodyl (DULCOLAX) 5 MG EC tablet Take 1 tablet (5 mg total) by mouth daily as needed for moderate constipation. 30 tablet 0   carvedilol (COREG) 25 MG tablet Take 25 mg by mouth 2 (two) times daily with a meal.     cephALEXin (KEFLEX) 500 MG capsule Take 1 capsule (500 mg total) by mouth 4 (four) times  daily. 20 capsule 0   cholecalciferol (VITAMIN D) 25 MCG (1000 UNIT) tablet Take 1,000 Units by mouth at bedtime.     cycloSPORINE (RESTASIS) 0.05 % ophthalmic emulsion Place 1 drop into both eyes 2 (two) times daily.     DULoxetine (CYMBALTA) 30 MG capsule Take 1 tablet by mouth daily. 30 mg daily     exemestane (AROMASIN) 25 MG tablet TAKE 1 TABLET BY MOUTH EVERY DAY AFTER breakfast 60 tablet 0   folic acid (FOLVITE) 1 MG tablet Take 1 mg by mouth daily.     furosemide (LASIX) 20 MG tablet Take 1 tablet (20 mg total) by mouth daily as needed (for swelling or weight gain). 90 tablet 3   gabapentin (NEURONTIN) 400 MG capsule Take 400 mg by mouth daily.     glipiZIDE (GLUCOTROL XL) 5 MG 24 hr tablet Take 1 tablet by mouth daily with breakfast.     Insulin Aspart (NOVOLOG FLEXPEN [place]) Inject 0-10 Units into the skin with breakfast, with lunch, and with evening meal. 0-150 = 0 units 150-199 = 2 units 200-249 = 4 units 250-299 = 6 units 300-349 = 8 units Greater than 350 = 10 units     insulin glargine  (LANTUS) 100 UNIT/ML injection Inject 0.3 mLs (30 Units total) into the skin 2 (two) times daily. 10 mL 0   JARDIANCE 10 MG TABS tablet Take by mouth.     lactulose (CHRONULAC) 10 GM/15ML solution Take 30 g by mouth daily.     losartan (COZAAR) 100 MG tablet Take 1 tablet (100 mg total) by mouth daily. 90 tablet 3   metFORMIN (GLUCOPHAGE) 500 MG tablet Take 500 mg by mouth 2 (two) times daily with a meal.      methocarbamol (ROBAXIN) 500 MG tablet Take 1 tablet (500 mg total) by mouth every 8 (eight) hours as needed for muscle spasms. 8 tablet 0   Omega-3 Fatty Acids (FISH OIL) 1000 MG CAPS Take 1,000 mg by mouth daily.     omeprazole (PRILOSEC) 20 MG capsule Take 1 capsule (20 mg total) by mouth daily. 30 capsule 11   ondansetron (ZOFRAN-ODT) 4 MG disintegrating tablet Take 4 mg by mouth 2 (two) times daily as needed for nausea or vomiting.     potassium chloride (MICRO-K) 10 MEQ CR capsule Take 10 mEq by mouth daily.     pregabalin (LYRICA) 75 MG capsule TAKE 1 CAPSULE BY MOUTH 2 TIMES DAILY 60 capsule 3   PROMETHEGAN 25 MG suppository place 1 SUPPOSITORY rectally EVERY 8 HOURS AS NEEDED FOR NAUSEA AND VOMITING 30 suppository 1   RESTASIS 0.05 % ophthalmic emulsion Place 1 drop into both eyes 2 (two) times daily as needed (dry eyes).     rOPINIRole (REQUIP) 0.25 MG tablet TAKE 1 TABLET 3 TIMES DAILY x7 DAYS, THEN TAKE 2 TABLETS 3 TIMES DAILY x7 DAYS, THEN TAKE 3 TABLETS 3 TIMES DAILY x7 DAYS, THEN TAKE 4 TABLETS 3 TIMES DAILY 90 tablet 1   rosuvastatin (CRESTOR) 5 MG tablet Take 5 mg by mouth at bedtime.     spironolactone (ALDACTONE) 25 MG tablet Take 1 tablet (25 mg total) by mouth daily. 90 tablet 3   vitamin B-12 (CYANOCOBALAMIN) 1000 MCG tablet Take 1,000 mcg by mouth daily.     zolpidem (AMBIEN) 5 MG tablet Take 1 tablet (5 mg total) by mouth at bedtime as needed for sleep. 20 tablet 0   No current facility-administered medications for this visit.    PHYSICAL EXAMINATION:  ECOG  PERFORMANCE STATUS: {CHL ONC ECOG PS:[phone]}  There were no vitals filed for this visit. Wt Readings from Last 3 Encounters:  06/27/22 198 lb 6.6 oz (90 kg)  05/30/22 200 lb (90.7 kg)  04/13/22 200 lb 6.4 oz (90.9 kg)    {Only keep what was examined. If exam not performed, can use .CEXAM } GENERAL:alert, no distress and comfortable SKIN: skin color, texture, turgor are normal, no rashes or significant lesions EYES: normal, Conjunctiva are pink and non-injected, sclera clear {OROPHARYNX:no exudate, no erythema and lips, buccal mucosa, and tongue normal}  NECK: supple, thyroid normal size, non-tender, without nodularity LYMPH:  no palpable lymphadenopathy in the cervical, axillary {or inguinal} LUNGS: clear to auscultation and percussion with normal breathing effort HEART: regular rate & rhythm and no murmurs and no lower extremity edema ABDOMEN:abdomen soft, non-tender and normal bowel sounds Musculoskeletal:no cyanosis of digits and no clubbing  NEURO: alert & oriented x 3 with fluent speech, no focal motor/sensory deficits  LABORATORY DATA:  I have reviewed the data as listed    Latest Ref Rng & Units 05/30/2022   11:17 AM 10/26/2021    7:59 AM 08/06/2021    4:02 AM  CBC  WBC 4.0 - 10.5 K/uL 4.8  5.5  5.0   Hemoglobin 12.0 - 15.0 g/dL 16.1  09.6  04.5   Hematocrit 36.0 - 46.0 % 39.1  34.1  31.7   Platelets 150 - 400 K/uL 257  235  230         Latest Ref Rng & Units 06/27/2022    3:35 AM 05/30/2022   11:17 AM 04/13/2022   10:11 AM  CMP  Glucose 70 - 99 mg/dL 409  811  914   BUN 8 - 23 mg/dL 19  25  25    Creatinine 0.44 - 1.00 mg/dL 7.82  9.56  2.13   Sodium 135 - 145 mmol/L 138  137  143   Potassium 3.5 - 5.1 mmol/L 3.8  4.2  3.5   Chloride 98 - 111 mmol/L 104  101  108   CO2 22 - 32 mmol/L 26  27  28    Calcium 8.9 - 10.3 mg/dL 08.6  57.8  46.9   Total Protein 6.5 - 8.1 g/dL  6.7    Total Bilirubin 0.3 - 1.2 mg/dL  0.8    Alkaline Phos 38 - 126 U/L  74    AST 15 -  41 U/L  13    ALT 0 - 44 U/L  17        RADIOGRAPHIC STUDIES: I have personally reviewed the radiological images as listed and agreed with the findings in the report. No results found.    No orders of the defined types were placed in this encounter.  All questions were answered. The patient knows to call the clinic with any problems, questions or concerns. No barriers to learning was detected. The total time spent in the appointment was {CHL ONC TIME VISIT - GEXBM:[id number]}.     Salome Holmes, [name] 02/09/2023   I, Monica Martinez, [name], am acting as scribe for Malachy Mood, MD.   {Add scribe attestation statement}

## 2023-03-07 ENCOUNTER — Telehealth: Payer: Self-pay | Admitting: Hematology

## 2023-03-07 NOTE — Telephone Encounter (Signed)
Contacted patient to scheduled appointments. Left message with appointment details and a call back number if patient had any questions or could not accommodate the time we provided.   

## 2023-03-22 ENCOUNTER — Other Ambulatory Visit: Payer: Self-pay

## 2023-03-22 ENCOUNTER — Other Ambulatory Visit: Payer: Self-pay | Admitting: Hematology

## 2023-03-22 DIAGNOSIS — C50412 Malignant neoplasm of upper-outer quadrant of left female breast: Secondary | ICD-10-CM

## 2023-03-22 MED ORDER — EXEMESTANE 25 MG PO TABS: ORAL_TABLET | ORAL | 0 refills | Status: DC

## 2023-03-22 NOTE — Progress Notes (Signed)
Pt needs a f/u appt with Dr. Mosetta Putt regarding AI Therapy.  Pt was [facility] seen in [facility] in June 2023.  Pt was supposed to be seen in August of 2023 but has not returned.  Scheduling Team has tried contact pt and daughter numerous times to schedule appt.  Appts are scheduled but pt is a No Show.  Only refilled a 30-day refill supply until pt is seen by a provider here w/in the [facility].

## 2023-03-27 ENCOUNTER — Other Ambulatory Visit: Payer: Self-pay | Admitting: Hematology

## 2023-03-27 DIAGNOSIS — C50412 Malignant neoplasm of upper-outer quadrant of left female breast: Secondary | ICD-10-CM

## 2023-03-28 ENCOUNTER — Other Ambulatory Visit: Payer: Self-pay

## 2023-03-28 ENCOUNTER — Other Ambulatory Visit: Payer: Self-pay | Admitting: Hematology

## 2023-03-28 DIAGNOSIS — C50412 Malignant neoplasm of upper-outer quadrant of left female breast: Secondary | ICD-10-CM

## 2023-03-28 MED ORDER — EXEMESTANE 25 MG PO TABS
ORAL_TABLET | ORAL | 0 refills | Status: DC
Start: 2023-03-28 — End: 2023-10-09

## 2023-04-25 ENCOUNTER — Other Ambulatory Visit: Payer: Self-pay | Admitting: Hematology

## 2023-04-25 ENCOUNTER — Telehealth: Payer: Self-pay | Admitting: Hematology

## 2023-04-25 DIAGNOSIS — C50412 Malignant neoplasm of upper-outer quadrant of left female breast: Secondary | ICD-10-CM

## 2023-04-25 NOTE — Telephone Encounter (Signed)
Pt needs a f/u appt w/a provider

## 2023-05-03 ENCOUNTER — Inpatient Hospital Stay: Payer: Medicare HMO | Attending: Hematology

## 2023-05-03 ENCOUNTER — Inpatient Hospital Stay: Payer: Medicare HMO | Admitting: Nurse Practitioner

## 2023-05-03 NOTE — Progress Notes (Deleted)
Patient Care Team: Hillery Aldo, NP as PCP - General Manus Rudd, MD as Consulting Physician (General Surgery) Malachy Mood, MD as Consulting Physician (Hematology) Dorothy Puffer, MD as Consulting Physician (Radiation Oncology) [name], Ronnald Ramp, MD as Consulting Physician (Cardiology)   CHIEF COMPLAINT:   Oncology History Overview Note  Cancer Staging Malignant neoplasm of upper-outer quadrant of left breast in female, estrogen receptor positive [facility]) Staging form: Breast, AJCC 8th Edition - Clinical stage from 07/16/2020: Stage IB (cT2, cN1, cM0, G2, ER+, PR+, HER2+) - Signed by Malachy Mood, MD on 07/21/2020    Malignant neoplasm of upper-outer quadrant of left breast in female, estrogen receptor positive (HCC)  07/01/2020 Mammogram   IMPRESSION: 1. There is a 12 cm span of highly suspicious calcifications involving the upper outer and upper inner quadrants of the left breast. On ultrasound, within this area of calcifications, there are confluent masses at 1 o'clock spanning at least 4.5 cm. An additional suspicious mass is seen in the left breast at 2 o'clock.  -Ultrasound targeted to the left breast at 1 o'clock, 5 cm from the nipple demonstrates a broad area of hypoechoic irregular tissue/confluent masses spanning at least 4.5 cm. There is a separate oval hypoechoic mass with indistinct margins at 2 o'clock, 7 cm from the nipple measuring 0.9 x 0.4 x 0.9 cm. Ultrasound of the left axilla demonstrates 2 abnormally thickened lymph nodes with cortices measuring 6-7 mm. There is an additional lymph node with a borderline cortex of 4 mm.   2. There are 2 lymph nodes with thickened cortices in the left axilla, and an additional lymph node with a borderline thickened cortex.   07/16/2020 Cancer Staging   Staging form: Breast, AJCC 8th Edition - Clinical stage from 07/16/2020: Stage IIA (cT3, cN1, cM0, G2, ER+, PR+, HER2+) - Signed by Malachy Mood, MD on 08/05/2020    07/16/2020 Initial Biopsy   Diagnosis 1. 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 Breast, left, needle core biopsy, 1 o'clock - INVASIVE DUCTAL CARCINOMA - SEE COMMENT 3. Lymph node, biopsy - METASTATIC CARCINOMA INVOLVING A LYMPH NODE - SEE COMMENT Microscopic Comment 1. Based on the biopsy, the ductal carcinoma in situ has a comedo pattern, high nuclear grade and measures 0.7 cm in greatest linear extent. Prognostic markers (ER/PR) are pending and will be reported in an addendum. 2. Based on the biopsy, the carcinoma appears Nottingham grade 2 of 3 and measures 1.5 cm in greatest linear extent. Prognostic markers (ER/PR/ki-67/HER2) are pending and will be reported in an addendum. Dr. Luisa Hart reviewed the case and agrees with the above diagnosis. These results were called to [facility] on July 17, 2020. 3. Based on the biopsy, the carcinoma appears measures 1.0 cm in greatest linear extent. Prognostic markers (ER/PR/ki-67/HER2) are pending and will be reported in an addendum.   07/16/2020 Receptors her2   1. PROGNOSTIC INDICATORS Results: IMMUNOHISTOCHEMICAL AND MORPHOMETRIC ANALYSIS PERFORMED MANUALLY Estrogen Receptor: 30%, POSITIVE, STRONG STAINING INTENSITY Progesterone Receptor: 5%, POSITIVE, STRONG STAINING INTENSITY  2. PROGNOSTIC INDICATORS Results: IMMUNOHISTOCHEMICAL AND MORPHOMETRIC ANALYSIS PERFORMED MANUALLY The tumor cells are POSITIVE for Her2 (3+). Estrogen Receptor: 85%, POSITIVE, STRONG STAINING INTENSITY Progesterone Receptor: 60%, POSITIVE, STRONG STAINING INTENSITY Proliferation Marker Ki67: 25%  3. PROGNOSTIC INDICATORS Results: IMMUNOHISTOCHEMICAL AND MORPHOMETRIC ANALYSIS PERFORMED MANUALLY The tumor cells are POSITIVE for Her2 (3+). Estrogen Receptor: 95%, POSITIVE, STRONG STAINING INTENSITY Progesterone Receptor:  45%, POSITIVE, STRONG STAINING INTENSITY Proliferation Marker Ki67: 30%   07/20/2020 Initial Diagnosis   Malignant neoplasm of upper-outer quadrant of left breast in female, estrogen receptor positive (HCC)   07/31/2020 Echocardiogram   Baseline Echo  IMPRESSIONS     1. Left ventricular ejection fraction, by estimation, is 65 to 70%. The  left ventricle has normal function. The left ventricle has no regional  wall motion abnormalities. Left ventricular diastolic parameters are  indeterminate. The average left  ventricular global longitudinal strain is -17.1 %.   2. Right ventricular systolic function is normal. The right ventricular  size is normal. There is normal pulmonary artery systolic pressure.   3. The mitral valve is normal in structure. Trivial mitral valve  regurgitation. No evidence of mitral stenosis.   4. The aortic valve is normal in structure. Aortic valve regurgitation is  not visualized. No aortic stenosis is present.    07/31/2020 Breast MRI   IMPRESSION: 1. Extensive non mass enhancement within the left breast extending from the nipple to the chest wall. Overall findings are most compatible with extensive malignancy involving the majority of the left breast. 2. Indeterminate enhancing mass within the upper inner right breast. 3. Three morphologically abnormal lymph nodes within the left axilla. One of these nodes has been biopsied compatible with metastatic adenopathy.     08/03/2020 Imaging   Bone Scan whole body  IMPRESSION: Increased radiotracer uptake in the mid to lower lumbar regions of uncertain etiology. Correlation with radiography to assess for potential arthropathy in these areas advised. Increased uptake in major joints is likely of arthropathic etiology. Increased uptake in the mid face is likely due to paranasal sinus disease. Distribution of radiotracer uptake in bony structures elsewhere unremarkable.   08/04/2020 Procedure   PAC  placement by Dr Corliss Skains   08/05/2020 - 10/28/2020 Neo-Adjuvant Chemotherapy   Neoadjuvant THP q3weeks for 4-6 cycles starting 08/05/20, followed by Herceptin or Kadcyla q3weeks to complete 1 year of treatment. Stopped THP after 1 dose due to poor toleration       -I switched her to Kadcyla q3weeks starting 08/25/20. Stopped after C2 due to poor toleration (10/28/20)   08/07/2020 Imaging   CT CAP  IMPRESSION: 2.7 cm mass in the upper outer left breast, likely corresponding to the patient's known primary breast neoplasm.   8 mm short axis left axillary node, suspicious for nodal metastasis in this clinical context.   No findings suspicious for distant metastasis.   Endometrium is mildly prominent, measuring 11 mm. Correlate for vaginal bleeding and consider pelvic ultrasound for further evaluation if clinically warranted.   08/11/2020 Pathology Results   Diagnosis Breast, right, needle core biopsy, right breast - PAPILLARY LESION - SEE COMMENT Microscopic Comment The biopsy consists of a papillary lesion with a focal area of adenosis. Immunohistochemistry (SMM, calponin and p63) shows retention of the myoepithelial cell layer in the focus of adenosis. 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 These results were called to [facility] on August 12, 2020.   10/27/2020 Imaging   CT Angio chest  IMPRESSION: Slightly suboptimal opacification of the main pulmonary artery, however no central or proximal segmental pulmonary embolism   Minimal bibasilar atelectasis   Aortic Atherosclerosis (ICD10-I70.0).   10/31/2020 Imaging   CT AP  IMPRESSION: 1. No acute finding by CT other than a large amount of stool and gas in the right colon and gas in the transverse colon. No small bowel obstruction. 2.  Previous cholecystectomy. 3. Aortic atherosclerosis. 4. Umbilical hernia containing only fat. 5. Small amount of fluid in the endometrial canal as  seen previously.   Aortic Atherosclerosis (ICD10-I70.0).   12/04/2020 Imaging   MRI Breast IMPRESSION: 1. Evidence of a positive response to interval treatment. There has been a significant improvement of both the extent and strength of the enhancement throughout the outer LEFT breast when compared to the earlier MRI of 07/31/2020. 2. However, there remains scattered areas of mass and non-mass enhancement throughout the outer LEFT breast, involving the upper outer quadrant and lower outer quadrant, as detailed above. 3. The 3 morphologically abnormal lymph nodes in the LEFT axilla identified on previous MRI, 1 a biopsy-proven metastasis, are all slightly smaller indicating an additional positive response to interval treatment. 4. No evidence of malignancy within the RIGHT breast.   12/10/2020 - 09/01/2021 Chemotherapy   Patient is on Treatment Plan : BREAST Trastuzumab + Perjeta (05/06/21) q21d      12/30/2020 Surgery   A. BREAST, LEFT, MASTECTOMY:  - Focal residual invasive ductal carcinoma status post neoadjuvant  treatment.  - Residual ductal carcinoma in situ.  - Margins of resection are not involved.  - Biopsy clip (X2).   B. LYMPH NODES, LEFT #1, DISSECTION:  - Three lymph nodes, negative for carcinoma (0/3).  - Biopsy clip (X1).   C. LYMPH NODES, LEFT #2, DISSECTION:  - Two lymph nodes, negative for carcinoma (0/2).   PROGNOSTIC INDICATOR RESULTS:  - The tumor cells are POSITIVE for Her2 (3+).  - Estrogen Receptor:       POSITIVE, 90%, STRONG STAINING  - Progesterone Receptor:   POSITIVE, 50%, MODERATE STAINING    12/30/2020 Cancer Staging   Staging form: Breast, AJCC 8th Edition - Pathologic stage from 12/30/2020: No Stage Recommended (ypT1a, pN0, cM0, G2, ER+, PR+, HER2+) - Signed by Malachy Mood, MD on 01/20/2021 Stage prefix: Post-therapy Histologic grading system: 3 grade system Residual tumor (R): R0 - None   02/17/2021 - 04/08/2021 Radiation Therapy    Site  Technique Total Dose (Gy) Dose per Fx (Gy) Completed Fx Beam Energies  Chest Wall, Left: CW_Lt 3D 50.4/50.4 1.8 28/28 10X  Chest Wall, Left: CW_Lt_SCLV 3D 50.4/50.4 1.8 28/28 6X, 15X  Chest Wall, Left: CW_Lt_Bst Electron 10/10 2 5/5 9E      Genetic Testing   [facility] CustomNext Panel was Negative. Report date is 03/08/2022.  The CustomNext-Cancer+RNAinsight panel offered by [employer] includes sequencing and rearrangement analysis for the following 47 genes:  APC, ATM, AXIN2, BARD1, BMPR1A, BRCA1, BRCA2, BRIP1, CDH1, CDK4, CDKN2A, CHEK2, CTNNA1, DICER1, EPCAM, GREM1, HOXB13, KIT, MEN1, MLH1, MSH2, MSH3, MSH6, MUTYH, NBN, NF1, NTHL1, PALB2, PDGFRA, PMS2, POLD1, POLE, PTEN, RAD50, RAD51C, RAD51D, SDHA, SDHB, SDHC, SDHD, SMAD4, SMARCA4, STK11, TP53, TSC1, TSC2, and VHL.  RNA data is routinely analyzed for use in variant interpretation for all genes.      CURRENT THERAPY:  Exemestane  Kanjinti q3 weeks, perjeta discontinued - last dose 09/01/21 (total of 11 doses)  INTERVAL HISTORY Cheryl Blankenship returns for follow up. Last seen by Dr. Mosetta Putt 10/26/21 and trastuzumab was stopped. She was to continue exemestane. She had virtual SCP visit 01/28/22 and lost follow up  ROS   Past Medical History:  Diagnosis Date   Anxiety    Breast cancer (HCC)    Cancer (HCC)    CHF (congestive heart failure) (HCC)    Congestive heart failure (CHF) (HCC)    Depression    Diabetes mellitus without complication (  HCC)    type 2   Diabetes mellitus without complication (HCC)    GERD (gastroesophageal reflux disease)    High cholesterol    History of kidney stones    Hypertension    PONV (postoperative nausea and vomiting)    Stroke [facility])      Past Surgical History:  Procedure Laterality Date   APPENDECTOMY     BREAST SURGERY     CHOLECYSTECTOMY     COLONOSCOPY     ESOPHAGOGASTRODUODENOSCOPY (EGD) WITH PROPOFOL N/A 11/01/2020   Procedure: ESOPHAGOGASTRODUODENOSCOPY (EGD) WITH PROPOFOL;  Surgeon: Willis Modena, MD;  Location: [facility];  Service: Endoscopy;  Laterality: N/A;   EYE SURGERY     cataracts   MASTECTOMY     MASTECTOMY WITH AXILLARY LYMPH NODE DISSECTION Left 12/30/2020   Procedure: LEFT MASTECTOMY WITH TARGETED AXILLARY LYMPH NODE DISSECTION;  Surgeon: Manus Rudd, MD;  Location: [facility] OR;  Service: General;  Laterality: Left;   MULTIPLE TOOTH EXTRACTIONS     PORT-A-CATH REMOVAL Right 04/13/2022   Procedure: REMOVAL PORT-A-CATH;  Surgeon: Manus Rudd, MD;  Location:  [facility];  Service: General;  Laterality: Right;   PORTACATH PLACEMENT N/A 08/04/2020   Procedure: INSERTION PORT-A-CATH WITH ULTRASOUND GUIDANCE;  Surgeon: Manus Rudd, MD;  Location: [facility] OR;  Service: General;  Laterality: N/A;   STOMACH SURGERY       Outpatient Encounter Medications as of 05/03/2023  Medication Sig   Accu-Chek FastClix Lancets MISC 1 each by Other route in the morning, at noon, and at bedtime.   acetaminophen (TYLENOL) 500 MG tablet Take 1 tablet (500 mg total) by mouth every 6 (six) hours as needed for moderate pain, headache or mild pain.   albuterol (VENTOLIN HFA) 108 (90 Base) MCG/ACT inhaler Inhale 1-2 puffs into the lungs every 4 (four) hours as needed for wheezing or shortness of breath.   ALPRAZolam (XANAX) 0.25 MG tablet Take 1-2 tablets (0.25-0.5 mg total) by mouth 3 (three) times daily as needed for anxiety or sleep.   amLODipine (NORVASC) 10 MG tablet Take 10 mg by mouth daily.   aspirin 81 MG chewable tablet Chew 81 mg by mouth daily.    bisacodyl (DULCOLAX) 5 MG EC tablet Take 1 tablet (5 mg total) by mouth daily as needed for moderate constipation.   carvedilol (COREG) 25 MG tablet Take 25 mg by mouth 2 (two) times daily with a meal.   cephALEXin (KEFLEX) 500 MG capsule Take 1 capsule (500 mg total) by mouth 4 (four) times daily.   cholecalciferol (VITAMIN D) 25 MCG (1000 UNIT) tablet Take 1,000 Units by mouth at bedtime.   cycloSPORINE (RESTASIS) 0.05 %  ophthalmic emulsion Place 1 drop into both eyes 2 (two) times daily.   DULoxetine (CYMBALTA) 30 MG capsule Take 1 tablet by mouth daily. 30 mg daily   exemestane (AROMASIN) 25 MG tablet TAKE 1 TABLET BY MOUTH EVERY DAY AFTER breakfast   folic acid (FOLVITE) 1 MG tablet Take 1 mg by mouth daily.   furosemide (LASIX) 20 MG tablet Take 1 tablet (20 mg total) by mouth daily as needed (for swelling or weight gain).   gabapentin (NEURONTIN) 400 MG capsule Take 400 mg by mouth daily.   glipiZIDE (GLUCOTROL XL) 5 MG 24 hr tablet Take 1 tablet by mouth daily with breakfast.   Insulin Aspart (NOVOLOG FLEXPEN [place]) Inject 0-10 Units into the skin with breakfast, with lunch, and with evening meal. 0-150 = 0 units 150-199 = 2 units 200-249 =  4 units 250-299 = 6 units 300-349 = 8 units Greater than 350 = 10 units   insulin glargine (LANTUS) 100 UNIT/ML injection Inject 0.3 mLs (30 Units total) into the skin 2 (two) times daily.   JARDIANCE 10 MG TABS tablet Take by mouth.   lactulose (CHRONULAC) 10 GM/15ML solution Take 30 g by mouth daily.   losartan (COZAAR) 100 MG tablet Take 1 tablet (100 mg total) by mouth daily.   metFORMIN (GLUCOPHAGE) 500 MG tablet Take 500 mg by mouth 2 (two) times daily with a meal.    methocarbamol (ROBAXIN) 500 MG tablet Take 1 tablet (500 mg total) by mouth every 8 (eight) hours as needed for muscle spasms.   Omega-3 Fatty Acids (FISH OIL) 1000 MG CAPS Take 1,000 mg by mouth daily.   omeprazole (PRILOSEC) 20 MG capsule Take 1 capsule (20 mg total) by mouth daily.   ondansetron (ZOFRAN-ODT) 4 MG disintegrating tablet Take 4 mg by mouth 2 (two) times daily as needed for nausea or vomiting.   potassium chloride (MICRO-K) 10 MEQ CR capsule Take 10 mEq by mouth daily.   pregabalin (LYRICA) 75 MG capsule TAKE 1 CAPSULE BY MOUTH 2 TIMES DAILY   PROMETHEGAN 25 MG suppository place 1 SUPPOSITORY rectally EVERY 8 HOURS AS NEEDED FOR NAUSEA AND VOMITING   RESTASIS 0.05 % ophthalmic  emulsion Place 1 drop into both eyes 2 (two) times daily as needed (dry eyes).   rOPINIRole (REQUIP) 0.25 MG tablet TAKE 1 TABLET 3 TIMES DAILY x7 DAYS, THEN TAKE 2 TABLETS 3 TIMES DAILY x7 DAYS, THEN TAKE 3 TABLETS 3 TIMES DAILY x7 DAYS, THEN TAKE 4 TABLETS 3 TIMES DAILY   rosuvastatin (CRESTOR) 5 MG tablet Take 5 mg by mouth at bedtime.   spironolactone (ALDACTONE) 25 MG tablet Take 1 tablet (25 mg total) by mouth daily.   vitamin B-12 (CYANOCOBALAMIN) 1000 MCG tablet Take 1,000 mcg by mouth daily.   zolpidem (AMBIEN) 5 MG tablet Take 1 tablet (5 mg total) by mouth at bedtime as needed for sleep.   No facility-administered encounter medications on file as of 05/03/2023.     There were no vitals filed for this visit. There is no height or weight on file to calculate BMI.   PHYSICAL EXAM GENERAL:alert, no distress and comfortable SKIN: no rash  EYES: sclera clear NECK: without mass LYMPH:  no palpable cervical or supraclavicular lymphadenopathy  LUNGS: clear with normal breathing effort HEART: regular rate & rhythm, no lower extremity edema ABDOMEN: abdomen soft, non-tender and normal bowel sounds NEURO: alert & oriented x 3 with fluent speech, no focal motor/sensory deficits Breast exam:  PAC without erythema    CBC    Component Value Date/Time   WBC 4.8 05/30/2022 1117   RBC 4.42 05/30/2022 1117   HGB 12.6 05/30/2022 1117   HGB 10.9 (L) 10/26/2021 0759   HCT 39.1 05/30/2022 1117   PLT 257 05/30/2022 1117   PLT 235 10/26/2021 0759   MCV 88.5 05/30/2022 1117   MCH 28.5 05/30/2022 1117   MCHC 32.2 05/30/2022 1117   RDW 12.2 05/30/2022 1117   LYMPHSABS 1.3 05/30/2022 1117   MONOABS 0.3 05/30/2022 1117   EOSABS 0.1 05/30/2022 1117   BASOSABS 0.0 05/30/2022 1117     CMP     Component Value Date/Time   NA 138 06/27/2022 0335   K 3.8 06/27/2022 0335   CL 104 06/27/2022 0335   CO2 26 06/27/2022 0335   GLUCOSE 243 (H) 06/27/2022 0335  BUN 19 06/27/2022 0335   CREATININE  0.96 06/27/2022 0335   CREATININE 2.01 (H) 10/26/2021 0759   CALCIUM 10.1 06/27/2022 0335   CALCIUM 10.6 (H) 05/27/2021 1302   PROT 6.7 05/30/2022 1117   ALBUMIN 3.5 05/30/2022 1117   AST 13 (L) 05/30/2022 1117   AST 12 (L) 10/26/2021 0759   ALT 17 05/30/2022 1117   ALT 10 10/26/2021 0759   ALKPHOS 74 05/30/2022 1117   BILITOT 0.8 05/30/2022 1117   BILITOT 0.5 10/26/2021 0759   GFRNONAA >60 06/27/2022 0335   GFRNONAA 27 (L) 10/26/2021 0759     ASSESSMENT & PLAN:  PLAN:  No orders of the defined types were placed in this encounter.     All questions were answered. The patient knows to call the clinic with any problems, questions or concerns. No barriers to learning were detected. I spent *** counseling the patient face to face. The total time spent in the appointment was *** and more than 50% was on counseling, review of test results, and coordination of care.   Santiago Glad, NP-C @DATE @

## 2023-05-06 ENCOUNTER — Other Ambulatory Visit: Payer: Self-pay | Admitting: Internal Medicine

## 2023-05-13 ENCOUNTER — Other Ambulatory Visit: Payer: Self-pay | Admitting: Internal Medicine

## 2023-05-13 ENCOUNTER — Other Ambulatory Visit: Payer: Self-pay | Admitting: Hematology

## 2023-05-13 DIAGNOSIS — Z170 Estrogen receptor positive status [ER+]: Secondary | ICD-10-CM

## 2023-05-16 NOTE — Telephone Encounter (Signed)
Patient needs appt prior to refill

## 2023-06-24 ENCOUNTER — Other Ambulatory Visit: Payer: Self-pay | Admitting: Hematology

## 2023-06-24 DIAGNOSIS — C50412 Malignant neoplasm of upper-outer quadrant of left female breast: Secondary | ICD-10-CM

## 2023-06-26 ENCOUNTER — Telehealth: Payer: Self-pay | Admitting: Hematology

## 2023-07-04 NOTE — Telephone Encounter (Signed)
TC

## 2023-07-20 ENCOUNTER — Inpatient Hospital Stay: Payer: Medicare Other | Attending: Hematology

## 2023-07-20 ENCOUNTER — Inpatient Hospital Stay: Payer: Medicare Other | Admitting: Hematology

## 2023-07-20 NOTE — Assessment & Plan Note (Deleted)
Stage II, c(T2N1M0), triple positive, Grade II  -She was diagnosed in 06/2020. She has 12cm calcification and 4.5cm mass in her left breast with 2 abnormal LN. 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  -Her MRI Breast from 07/31/20 shows very extensive disease in her left breast and 3 abnormal LNs in the left axilla.  -staging scans 07/2020 showed no distant metastasis -She started neoadjuvant chemo THP q3weeks on 08/05/20 which was dose reduced with C1. Tolerated poorly with body aches, n/v/d. She was switched to [facility] but also tolerated poorly, DC'd after 2 cycles. -She began exemestane 12/10/20, tolerating well with hot and cold flashes -S/p left mastectomy on 12/30/20 under Dr. Corliss Skains. Pathology showed: focal residual invasive ductal carcinoma s/p neoadjuvant treatment; residual DCIS; margins of resection not involved; all 5 lymph nodes negative (0/5).  -she completed adjuvant RT per Dr. Mitzi Hansen 04/07/21 -herceptin/perjeta and exemestane on hold since 04/2021 for weakness, neuro and syncope work up -resumed exemestane and herceptin only 06/22/21.  She had multiple treatment delay due to various reasons (hopital admission, no shows), last dose on 09/01/2021.

## 2023-08-01 ENCOUNTER — Other Ambulatory Visit: Payer: Self-pay | Admitting: Hematology

## 2023-08-01 DIAGNOSIS — C50412 Malignant neoplasm of upper-outer quadrant of left female breast: Secondary | ICD-10-CM

## 2023-08-02 NOTE — Telephone Encounter (Signed)
Pt needs a f/u appt w/Dr. Serita Grammes, NP, or Lillard Anes, NP.

## 2023-08-08 ENCOUNTER — Other Ambulatory Visit: Payer: Self-pay | Admitting: Hematology

## 2023-08-08 DIAGNOSIS — C50412 Malignant neoplasm of upper-outer quadrant of left female breast: Secondary | ICD-10-CM

## 2023-08-09 ENCOUNTER — Other Ambulatory Visit: Payer: Self-pay

## 2023-08-18 ENCOUNTER — Telehealth: Payer: Self-pay | Admitting: Hematology

## 2023-08-24 ENCOUNTER — Inpatient Hospital Stay: Payer: Medicare Other | Admitting: Hematology

## 2023-08-24 ENCOUNTER — Inpatient Hospital Stay: Payer: Medicare Other | Attending: Hematology

## 2023-08-24 NOTE — Assessment & Plan Note (Signed)
Stage II, c(T2N1M0), triple positive, Grade II  -She was diagnosed in 06/2020. She has 12cm calcification and 4.5cm mass in her left breast with 2 abnormal LN. 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  -Her MRI Breast from 07/31/20 shows very extensive disease in her left breast and 3 abnormal LNs in the left axilla.  -staging scans 07/2020 showed no distant metastasis -She started neoadjuvant chemo THP q3weeks on 08/05/20 which was dose reduced with C1. Tolerated poorly with body aches, n/v/d. She was switched to [facility] but also tolerated poorly, DC'd after 2 cycles. -She began exemestane 12/10/20, tolerating well with hot and cold flashes -S/p left mastectomy on 12/30/20 under Dr. Corliss Skains. Pathology showed: focal residual invasive ductal carcinoma s/p neoadjuvant treatment; residual DCIS; margins of resection not involved; all 5 lymph nodes negative (0/5).  -she completed adjuvant RT per Dr. Mitzi Hansen 04/07/21 -herceptin/perjeta and exemestane on hold since 04/2021 for weakness, neuro and syncope work up -resumed exemestane and herceptin only 06/22/21.  She had multiple treatment delay due to various reasons (hopital admission, no shows), last dose on 09/01/2021. -Patient stated that she carsick after last dose of trastuzumab, and does not want to restart.  We will stop it, she received a total of 11 doses of trastuzumab -We will continue exemestane. -Lost follow-up after her last visit in May 2023

## 2023-09-07 ENCOUNTER — Other Ambulatory Visit: Payer: Self-pay

## 2023-09-07 ENCOUNTER — Telehealth: Payer: Self-pay

## 2023-09-07 NOTE — Telephone Encounter (Signed)
Pt's son called regarding refill on pt's Exemestane.  Pt's son stated lives now in [place] with him.  Pt's son stated the pt is scheduled to see an Best boy at [facility] in [place] on 09/18/2023.  Pt's son stated he was unaware of the process needed to transfer the pt's care because the pt stated that Dr. Mosetta Putt would just keep refilling her Exemestane w/out the pt being seen in clinic.  Stated the No the pt would need to be seen w/in clinic by Dr. Mosetta Putt periodically d/t liability considering the pt is on Exemestane.  Pt's son verbalized understanding. Pt and son requested if the referral could be made and medical records sent to the [facility].  Stated this nurse will send the paperwork to [facility] on 09/08/2023 so they will have the pt's records prior to her appt on 09/18/2023.  Pt and son had no further questions or concerns.

## 2023-09-08 ENCOUNTER — Other Ambulatory Visit: Payer: Self-pay

## 2023-09-08 DIAGNOSIS — C50412 Malignant neoplasm of upper-outer quadrant of left female breast: Secondary | ICD-10-CM

## 2023-09-08 NOTE — Progress Notes (Signed)
Referral to [facility] sent and fax confirmation received.  [phone]).  Referral package sent and scan images sent through [facility] to [facility] by Ms. Rebecka Apley!

## 2023-10-09 ENCOUNTER — Other Ambulatory Visit: Payer: Self-pay | Admitting: Hematology

## 2023-10-09 DIAGNOSIS — C50412 Malignant neoplasm of upper-outer quadrant of left female breast: Secondary | ICD-10-CM

## 2024-02-21 ENCOUNTER — Other Ambulatory Visit: Payer: Self-pay | Admitting: Hematology

## 2024-02-21 DIAGNOSIS — Z170 Estrogen receptor positive status [ER+]: Secondary | ICD-10-CM

## 2024-02-22 NOTE — Telephone Encounter (Signed)
 Pt now lives in [place] with son and sees an Best boy in [place]

## 2024-06-20 ENCOUNTER — Other Ambulatory Visit: Payer: Self-pay

## 2024-10-09 ENCOUNTER — Telehealth: Payer: Self-pay

## 2024-10-09 NOTE — Telephone Encounter (Signed)
 Opened by accident
